# Patient Record
Sex: Female | Born: 1940
Health system: Southern US, Community
[De-identification: ages and names within clinical notes are randomized; demographics above are authoritative.]

## PROBLEM LIST (undated history)

## (undated) DIAGNOSIS — Z905 Acquired absence of kidney: Secondary | ICD-10-CM

## (undated) DIAGNOSIS — J439 Emphysema, unspecified: Secondary | ICD-10-CM

## (undated) DIAGNOSIS — L309 Dermatitis, unspecified: Secondary | ICD-10-CM

## (undated) DIAGNOSIS — E785 Hyperlipidemia, unspecified: Secondary | ICD-10-CM

## (undated) DIAGNOSIS — N182 Chronic kidney disease, stage 2 (mild): Secondary | ICD-10-CM

## (undated) DIAGNOSIS — C569 Malignant neoplasm of unspecified ovary: Secondary | ICD-10-CM

## (undated) DIAGNOSIS — G479 Sleep disorder, unspecified: Secondary | ICD-10-CM

## (undated) DIAGNOSIS — I10 Essential (primary) hypertension: Secondary | ICD-10-CM

## (undated) DIAGNOSIS — Z87448 Personal history of other diseases of urinary system: Secondary | ICD-10-CM

## (undated) DIAGNOSIS — I2510 Atherosclerotic heart disease of native coronary artery without angina pectoris: Secondary | ICD-10-CM

## (undated) DIAGNOSIS — K219 Gastro-esophageal reflux disease without esophagitis: Secondary | ICD-10-CM

## (undated) DIAGNOSIS — Z9289 Personal history of other medical treatment: Secondary | ICD-10-CM

## (undated) DIAGNOSIS — J449 Chronic obstructive pulmonary disease, unspecified: Secondary | ICD-10-CM

## (undated) HISTORY — DX: Acquired absence of kidney: Z90.5

## (undated) HISTORY — DX: Personal history of other diseases of urinary system: Z87.448

## (undated) HISTORY — PX: TONSILLECTOMY: SUR1361

## (undated) HISTORY — DX: Chronic kidney disease, stage 2 (mild): N18.2

## (undated) HISTORY — PX: TUBAL LIGATION: SHX77

## (undated) HISTORY — PX: NEPHRECTOMY: SHX65

## (undated) HISTORY — PX: ABDOMINAL HYSTERECTOMY: SHX81

## (undated) HISTORY — DX: Emphysema, unspecified: J43.9

## (undated) HISTORY — DX: Essential (primary) hypertension: I10

## (undated) HISTORY — PX: HAND SURGERY: SHX662

## (undated) HISTORY — DX: Malignant neoplasm of unspecified ovary: C56.9

## (undated) HISTORY — DX: Hyperlipidemia, unspecified: E78.5

## (undated) HISTORY — DX: Atherosclerotic heart disease of native coronary artery without angina pectoris: I25.10

## (undated) HISTORY — PX: THORACENTESIS: SHX235

## (undated) HISTORY — DX: Chronic obstructive pulmonary disease, unspecified: J44.9

---

## 2011-03-26 ENCOUNTER — Emergency Department (HOSPITAL_COMMUNITY): Payer: Medicare Other

## 2011-03-26 ENCOUNTER — Inpatient Hospital Stay (INDEPENDENT_AMBULATORY_CARE_PROVIDER_SITE_OTHER)
Admission: RE | Admit: 2011-03-26 | Discharge: 2011-03-26 | Disposition: A | Discharge status: Home / Self Care | Payer: Medicare Other | Source: Ambulatory Visit | Attending: Family Medicine | Admitting: Family Medicine

## 2011-03-26 ENCOUNTER — Emergency Department (HOSPITAL_COMMUNITY)
Admission: EM | Admit: 2011-03-26 | Discharge: 2011-03-26 | Disposition: A | Discharge status: Home / Self Care | Payer: Medicare Other | Attending: Emergency Medicine | Admitting: Emergency Medicine

## 2011-03-26 DIAGNOSIS — R0902 Hypoxemia: Secondary | ICD-10-CM

## 2011-03-26 DIAGNOSIS — I498 Other specified cardiac arrhythmias: Secondary | ICD-10-CM

## 2011-03-26 DIAGNOSIS — R059 Cough, unspecified: Secondary | ICD-10-CM | POA: Insufficient documentation

## 2011-03-26 DIAGNOSIS — Z79899 Other long term (current) drug therapy: Secondary | ICD-10-CM | POA: Insufficient documentation

## 2011-03-26 DIAGNOSIS — R05 Cough: Secondary | ICD-10-CM | POA: Insufficient documentation

## 2011-03-26 DIAGNOSIS — R509 Fever, unspecified: Secondary | ICD-10-CM | POA: Insufficient documentation

## 2011-03-26 DIAGNOSIS — I10 Essential (primary) hypertension: Secondary | ICD-10-CM | POA: Insufficient documentation

## 2011-03-26 DIAGNOSIS — E7800 Pure hypercholesterolemia, unspecified: Secondary | ICD-10-CM | POA: Insufficient documentation

## 2011-03-26 LAB — GLUCOSE, CAPILLARY: Glucose-Capillary: 119 mg/dL — ABNORMAL HIGH (ref 70–99)

## 2011-03-26 LAB — URINALYSIS, ROUTINE W REFLEX MICROSCOPIC
Glucose, UA: NEGATIVE mg/dL
Leukocytes, UA: NEGATIVE
Protein, ur: NEGATIVE mg/dL

## 2011-03-26 LAB — COMPREHENSIVE METABOLIC PANEL
ALT: 13 U/L (ref 0–35)
AST: 14 U/L (ref 0–37)
CO2: 27 mEq/L (ref 19–32)
Calcium: 10 mg/dL (ref 8.4–10.5)
GFR calc non Af Amer: >60 mL/min (ref >60)
Sodium: 141 mEq/L (ref 135–145)
Total Protein: 6.4 g/dL (ref 6.0–8.3)

## 2011-03-26 LAB — DIFFERENTIAL
Basophils Absolute: 0 10*3/uL (ref 0.0–0.1)
Basophils Relative: 0 % (ref 0–1)
Neutro Abs: 10.9 10*3/uL — ABNORMAL HIGH (ref 1.7–7.7)
Neutrophils Relative %: 95 % — ABNORMAL HIGH (ref 43–77)

## 2011-03-26 LAB — CBC
Hemoglobin: 14.1 g/dL (ref 12.0–15.0)
MCHC: 33.3 g/dL (ref 30.0–36.0)
RBC: 4.47 MIL/uL (ref 3.87–5.11)
WBC: 11.4 10*3/uL — ABNORMAL HIGH (ref 4.0–10.5)

## 2011-03-27 LAB — URINE CULTURE
Colony Count: NO GROWTH
Culture  Setup Time: 201209161750

## 2011-03-29 LAB — CULTURE, BLOOD (ROUTINE X 2): Culture  Setup Time: 201209161755

## 2011-07-12 DIAGNOSIS — J438 Other emphysema: Secondary | ICD-10-CM | POA: Diagnosis not present

## 2011-07-12 DIAGNOSIS — R911 Solitary pulmonary nodule: Secondary | ICD-10-CM | POA: Diagnosis not present

## 2011-07-14 DIAGNOSIS — Z1231 Encounter for screening mammogram for malignant neoplasm of breast: Secondary | ICD-10-CM | POA: Diagnosis not present

## 2011-07-17 DIAGNOSIS — J449 Chronic obstructive pulmonary disease, unspecified: Secondary | ICD-10-CM | POA: Diagnosis not present

## 2011-11-01 DIAGNOSIS — J449 Chronic obstructive pulmonary disease, unspecified: Secondary | ICD-10-CM | POA: Diagnosis not present

## 2011-11-01 DIAGNOSIS — E7800 Pure hypercholesterolemia, unspecified: Secondary | ICD-10-CM | POA: Diagnosis not present

## 2011-11-01 DIAGNOSIS — I10 Essential (primary) hypertension: Secondary | ICD-10-CM | POA: Diagnosis not present

## 2012-02-06 DIAGNOSIS — M899 Disorder of bone, unspecified: Secondary | ICD-10-CM | POA: Diagnosis not present

## 2012-02-06 DIAGNOSIS — M949 Disorder of cartilage, unspecified: Secondary | ICD-10-CM | POA: Diagnosis not present

## 2012-02-06 DIAGNOSIS — E785 Hyperlipidemia, unspecified: Secondary | ICD-10-CM | POA: Diagnosis not present

## 2012-02-06 DIAGNOSIS — J4489 Other specified chronic obstructive pulmonary disease: Secondary | ICD-10-CM | POA: Diagnosis not present

## 2012-02-06 DIAGNOSIS — J449 Chronic obstructive pulmonary disease, unspecified: Secondary | ICD-10-CM | POA: Diagnosis not present

## 2012-02-06 DIAGNOSIS — F17200 Nicotine dependence, unspecified, uncomplicated: Secondary | ICD-10-CM | POA: Diagnosis not present

## 2012-02-06 DIAGNOSIS — I10 Essential (primary) hypertension: Secondary | ICD-10-CM | POA: Diagnosis not present

## 2012-02-06 DIAGNOSIS — Z79899 Other long term (current) drug therapy: Secondary | ICD-10-CM | POA: Diagnosis not present

## 2012-02-13 DIAGNOSIS — M899 Disorder of bone, unspecified: Secondary | ICD-10-CM | POA: Diagnosis not present

## 2012-02-13 DIAGNOSIS — E785 Hyperlipidemia, unspecified: Secondary | ICD-10-CM | POA: Diagnosis not present

## 2012-02-13 DIAGNOSIS — Z79899 Other long term (current) drug therapy: Secondary | ICD-10-CM | POA: Diagnosis not present

## 2012-02-13 DIAGNOSIS — M949 Disorder of cartilage, unspecified: Secondary | ICD-10-CM | POA: Diagnosis not present

## 2012-03-06 ENCOUNTER — Encounter: Payer: Self-pay | Admitting: *Deleted

## 2012-03-06 ENCOUNTER — Encounter: Payer: Self-pay | Admitting: Pulmonary Disease

## 2012-03-07 ENCOUNTER — Encounter: Payer: Self-pay | Admitting: Pulmonary Disease

## 2012-03-07 ENCOUNTER — Ambulatory Visit (INDEPENDENT_AMBULATORY_CARE_PROVIDER_SITE_OTHER): Payer: Medicare Other | Admitting: Pulmonary Disease

## 2012-03-07 VITALS — BP 130/82 | HR 79 | Temp 98.1°F | Ht 61.0 in | Wt 148.4 lb

## 2012-03-07 DIAGNOSIS — J449 Chronic obstructive pulmonary disease, unspecified: Secondary | ICD-10-CM | POA: Insufficient documentation

## 2012-03-07 NOTE — Addendum Note (Signed)
Addended by: Orma Flaming D on: 03/07/2012 01:20 PM   Modules accepted: Orders

## 2012-03-07 NOTE — Patient Instructions (Addendum)
Continue on symbicort daily, and albuterol for rescue Will schedule for PFT's, and will call you with the results. Continue your activity You must stop smoking.  This is the key for you to maintain your excellent functional status. Make sure you get your flu shot this year. followup with me in 6mos.

## 2012-03-07 NOTE — Progress Notes (Signed)
  Subjective:    Patient ID: Lynn Ramirez, female    DOB: 1941-01-04, 71 y.o.   MRN: [id number]  HPI The pt is a 71y/o female who I have been asked to see for management of COPD.  She recently started seeing a pulmonologist in [place] the beginning of the year, and was told that she had COPD based on spirometry.  She was scheduled to have full PFTs, but moved to [place] prior to doing this.  The patient was started on symbicort with a rescue inhaler, and thinks it has helped her breathing was heavier exertional activities.  She is very functional, and stays extremely active.  She reports a 3-4 block dyspnea on exertion had a moderate pace on flat ground.  She does not get winded bringing groceries in from the car, or doing housework.  Her main issue is with moderate to heavy exertional activities.  She has minimal cough or mucus production, despite the fact that she continues to smoke.  She denies any issues with lower extremity edema.  She did have a CT chest in January of this year to followup possible nodules, and this showed only emphysematous changes with no evidence for pulmonary nodules at that time.   Review of Systems  Constitutional: Negative for fever and unexpected weight change.  HENT: Positive for congestion and sneezing. Negative for ear pain, nosebleeds, sore throat, rhinorrhea, trouble swallowing, dental problem, postnasal drip and sinus pressure.   Eyes: Negative for redness and itching.  Respiratory: Positive for cough, shortness of breath and wheezing. Negative for chest tightness.   Cardiovascular: Negative for palpitations and leg swelling.  Gastrointestinal: Negative for nausea and vomiting.  Genitourinary: Negative for dysuria.  Musculoskeletal: Positive for joint swelling.  Skin: Negative for rash.  Neurological: Negative for headaches.  Hematological: Bruises/bleeds easily.  Psychiatric/Behavioral: Negative for dysphoric mood. The patient is nervous/anxious.         Objective:   Physical Exam Constitutional:  Well developed, no acute distress  HENT:  Nares patent without discharge, deviated septum to left with narrowing.   Oropharynx without exudate, palate and uvula are normal  Eyes:  Perrla, eomi, no scleral icterus  Neck:  No JVD, no TMG  Cardiovascular:  Normal rate, regular rhythm, no rubs or gallops.  No murmurs        Intact distal pulses  Pulmonary :  Mildly decreased breath sounds, no stridor or respiratory distress   No rales, rhonchi, or wheezing  Abdominal:  Soft, nondistended, bowel sounds present.  No tenderness noted.   Musculoskeletal:  No lower extremity edema noted.  Lymph Nodes:  No cervical lymphadenopathy noted  Skin:  No cyanosis noted  Neurologic:  Alert, appropriate, moves all 4 extremities without obvious deficit.         Assessment & Plan:

## 2012-03-07 NOTE — Assessment & Plan Note (Signed)
The pt has a h/o copd by what sounds like spirometry in the past, and she was supposed to have full pfts before moving here from [place].  She has an excellent functional status, and has seen some improvement in her breathing with symbicort.  Unfortunately, she continues to smoke, and I have stressed the importance of total smoking cessation in order to maintain her current QOL.  Will call her with results of PFT's, and will see her back in 6mos if doing well.  She has already had a pneumovax recently, and I have reminded her to get her flu shot this fall.

## 2012-03-25 DIAGNOSIS — Z23 Encounter for immunization: Secondary | ICD-10-CM | POA: Diagnosis not present

## 2012-05-06 ENCOUNTER — Ambulatory Visit (INDEPENDENT_AMBULATORY_CARE_PROVIDER_SITE_OTHER): Payer: Medicare Other | Admitting: Pulmonary Disease

## 2012-05-06 ENCOUNTER — Telehealth: Payer: Self-pay | Admitting: Pulmonary Disease

## 2012-05-06 DIAGNOSIS — J449 Chronic obstructive pulmonary disease, unspecified: Secondary | ICD-10-CM | POA: Diagnosis not present

## 2012-05-06 LAB — PULMONARY FUNCTION TEST

## 2012-05-06 MED ORDER — ALBUTEROL SULFATE HFA 108 (90 BASE) MCG/ACT IN AERS: 2.0000 | INHALATION_SPRAY | Freq: Four times a day (QID) | RESPIRATORY_TRACT | Status: DC | PRN

## 2012-05-06 NOTE — Telephone Encounter (Signed)
Per Pt Instructions on 03-07-12:  Patient Instructions     Continue on symbicort daily, and albuterol for rescue  Will schedule for PFT's, and will call you with the results.  Continue your activity  You must stop smoking. This is the key for you to maintain your excellent functional status.  Make sure you get your flu shot this year.  followup with me in 6mos.     Rx has been sent to pharmacy; left message on patients voicemail that this has been taken care of and if any questions/concerns to please call our office.

## 2012-05-06 NOTE — Progress Notes (Signed)
PFT done today. 

## 2012-05-27 ENCOUNTER — Telehealth: Payer: Self-pay | Admitting: Pulmonary Disease

## 2012-05-27 NOTE — Telephone Encounter (Signed)
Please let pt know that her breathing studies show severe emphysema.  She must stop smoking.   Would like for her to continue with her current inhaler, but call us if symptoms are worsening.  Keep 6 mos f/u apptm with me.

## 2012-05-28 NOTE — Telephone Encounter (Signed)
Pt is aware of PFT results per Dr. Shelle Iron and says she is working on smoking cessation. She will see KC back for follow-up in Feb 2014 and will call if any worsening sxs before then. Pt verbalized understanding.

## 2012-07-30 ENCOUNTER — Ambulatory Visit (INDEPENDENT_AMBULATORY_CARE_PROVIDER_SITE_OTHER): Payer: Medicare Other | Admitting: Pulmonary Disease

## 2012-07-30 ENCOUNTER — Ambulatory Visit (INDEPENDENT_AMBULATORY_CARE_PROVIDER_SITE_OTHER)
Admission: RE | Admit: 2012-07-30 | Discharge: 2012-07-30 | Disposition: A | Discharge status: Home / Self Care | Payer: Medicare Other | Source: Ambulatory Visit | Attending: Pulmonary Disease | Admitting: Pulmonary Disease

## 2012-07-30 ENCOUNTER — Encounter: Payer: Self-pay | Admitting: Pulmonary Disease

## 2012-07-30 ENCOUNTER — Telehealth: Payer: Self-pay | Admitting: Pulmonary Disease

## 2012-07-30 VITALS — BP 128/62 | HR 96 | Temp 98.2°F | Ht 61.0 in | Wt 151.4 lb

## 2012-07-30 DIAGNOSIS — J45909 Unspecified asthma, uncomplicated: Secondary | ICD-10-CM | POA: Insufficient documentation

## 2012-07-30 DIAGNOSIS — J45901 Unspecified asthma with (acute) exacerbation: Secondary | ICD-10-CM | POA: Diagnosis not present

## 2012-07-30 DIAGNOSIS — J449 Chronic obstructive pulmonary disease, unspecified: Secondary | ICD-10-CM

## 2012-07-30 MED ORDER — MOXIFLOXACIN HCL 400 MG PO TABS: 400.0000 mg | ORAL_TABLET | Freq: Every day | ORAL | Status: DC

## 2012-07-30 MED ORDER — PREDNISONE 10 MG PO TABS: ORAL_TABLET | ORAL | Status: DC

## 2012-07-30 NOTE — Telephone Encounter (Signed)
Pt called back to add the following: she called pcp and they cannot see her today. I have scheduled her to see dr clance tomorrow but she asks that she be seen today (but wants tomorrows appt if otherwise. Lynn Ramirez

## 2012-07-30 NOTE — Progress Notes (Signed)
  Subjective:    Patient ID: Lynn Ramirez, female    DOB: 15-Apr-1941, 71 y.o.   MRN: [id number]  HPI The patient comes in today for an acute sick visit.  She was recently diagnosed with severe obstructive lung disease, felt secondary to emphysema.  Unfortunately she has continued to smoke.  She gives a 4 day history of feeling poorly, but no fever or chills.  She denies a runny nose, sore throat, myalgias, or arthralgias.  She then developed a tight cough, but now is having a rattling cough but no mucus production.  She is not having a significant increase in shortness of breath, but does say that she has to use her albuterol more than usual.  Of note, her oxygen level is much lower than baseline today on evaluation.   Review of Systems  Constitutional: Positive for fatigue. Negative for fever and unexpected weight change.  HENT: Positive for rhinorrhea, sneezing, postnasal drip and sinus pressure. Negative for ear pain, nosebleeds, congestion, sore throat, trouble swallowing and dental problem.   Eyes: Negative for redness and itching.  Respiratory: Positive for cough and shortness of breath. Negative for chest tightness and wheezing.   Cardiovascular: Negative for palpitations and leg swelling.  Gastrointestinal: Negative for nausea and vomiting.  Genitourinary: Negative for dysuria.  Musculoskeletal: Negative for joint swelling.  Skin: Negative for rash.  Neurological: Negative for headaches.  Hematological: Does not bruise/bleed easily.  Psychiatric/Behavioral: Negative for dysphoric mood. The patient is not nervous/anxious.        Objective:   Physical Exam Well-developed female in no acute distress.  She talks in complete sentences without dyspnea Nose without purulence or discharge noted Oropharynx clear without exudates Neck without lymphadenopathy or thyromegaly Chest reveals very diminished breath sounds throughout, no wheezing, mild rhonchi on expiration Cardiac exam with  regular rate and rhythm Lower extremities without edema, and no cyanosis.  No calf tenderness noted Alert and oriented, moves all 4 extremities.       Assessment & Plan:

## 2012-07-30 NOTE — Assessment & Plan Note (Signed)
The patient's history is most consistent with acute asthmatic bronchitis, although I cannot exclude a possible pneumonia.  There is nothing by history to suggest influenza.  I would like to start her on a course of antibiotics, a prednisone taper, and will also need oxygen temporarily until she improves.  I discussed with her the possibility of hospital admission, but she would like to try and avoid this if at all possible.  She is in no acute distress, and has adequate saturations with supplemental oxygen.  However, I have cautioned her that she is to call us immediately or go to the emergency room if she is worsening.  I also stressed to her the importance of staying completely off cigarettes.  I will see her back in one week.

## 2012-07-30 NOTE — Patient Instructions (Addendum)
Will send down for a chest xray, and will call you with results.  Will treat with avelox 400mg  one a day for 7 days Will treat with prednisone taper as directed. Will need to start you on oxygen temporarily until you are better.  followup with me in one week, but you need to call us if you are not improving.  If you worsen during the middle of the night, go to ER.

## 2012-07-30 NOTE — Telephone Encounter (Signed)
Called, spoke with pt.  C/o fatigue, cough, HA, and chest congestion since Saturday.  Coughing very little mucus up that is clear to yellow.  Taking mucinex with no relief and using albuterol hfa bid since symptoms started.  OV scheduled today at 10:30 am with [facility] and appt on tomorrow was cancelled.  Pt aware of both and voiced no further questions or concerns at this time.

## 2012-07-31 ENCOUNTER — Telehealth: Payer: Self-pay | Admitting: Pulmonary Disease

## 2012-07-31 ENCOUNTER — Ambulatory Visit: Payer: Medicare Other | Admitting: Pulmonary Disease

## 2012-07-31 NOTE — Telephone Encounter (Signed)
Notes Recorded by Barbaraann Share, MD on 07/30/2012 at 6:11 PM Please let pt know no pna on cxr . Good news. --  I spoke with patient about results and she verbalized understanding and had no questions

## 2012-08-02 ENCOUNTER — Telehealth: Payer: Self-pay | Admitting: Pulmonary Disease

## 2012-08-02 NOTE — Telephone Encounter (Signed)
Per referral sent she needs to be on 2 liters 24/7.   I spoke with pt and is aware. Nothing further was needed

## 2012-08-06 ENCOUNTER — Ambulatory Visit (INDEPENDENT_AMBULATORY_CARE_PROVIDER_SITE_OTHER): Payer: Medicare Other | Admitting: Pulmonary Disease

## 2012-08-06 ENCOUNTER — Encounter: Payer: Self-pay | Admitting: Pulmonary Disease

## 2012-08-06 VITALS — BP 152/78 | HR 76 | Temp 97.8°F | Ht 61.0 in | Wt 155.0 lb

## 2012-08-06 DIAGNOSIS — J449 Chronic obstructive pulmonary disease, unspecified: Secondary | ICD-10-CM

## 2012-08-06 NOTE — Patient Instructions (Addendum)
Stay on symbicort every day, and rinse mouth well. Stay completely away from cigarettes. Will discontinue oxygen followup with me in 4mos to make sure you are doing ok, and cancel any extra apptms.

## 2012-08-06 NOTE — Progress Notes (Signed)
  Subjective:    Patient ID: Lynn Ramirez, female    DOB: [birth year], 72 y.o.   MRN: [id number]  HPI Patient comes in today for followup after her recent episode of acute asthmatic bronchitis with significant respiratory distress.  She was treated with antibiotics and prednisone, and returns today for followup.  She is much improved since the last visit, he feels that she is almost back to her baseline.  She still has a mild cough but no significant mucous production.  She has not smoked since her episode last week.   Review of Systems  Constitutional: Negative for fever and unexpected weight change.  HENT: Positive for voice change ( hoarseness with O2---dryness). Negative for ear pain, nosebleeds, congestion, sore throat, rhinorrhea, sneezing, trouble swallowing, dental problem, postnasal drip and sinus pressure.   Eyes: Negative for redness and itching.  Respiratory: Positive for cough ( deep cough at times---not as frequent as before) and wheezing ( at times). Negative for chest tightness and shortness of breath.   Cardiovascular: Negative for palpitations and leg swelling.  Gastrointestinal: Positive for diarrhea ( from Avelox). Negative for nausea and vomiting.       Bloating/gas from Pred  Genitourinary: Negative for dysuria.  Musculoskeletal: Negative for joint swelling.  Skin: Negative for rash.  Neurological: Negative for headaches.  Hematological: Does not bruise/bleed easily.  Psychiatric/Behavioral: Negative for dysphoric mood. The patient is not nervous/anxious.        Objective:   Physical Exam Overweight female in no acute distress Nose without purulence or discharge Neck without lymphadenopathy or thyromegaly Chest with mild decrease in breath sounds, no wheezing Cardiac exam with regular rate and rhythm Lower extremities without significant edema, no cyanosis Alert and oriented, moves all 4.        Assessment & Plan:

## 2012-08-06 NOTE — Assessment & Plan Note (Signed)
The patient appears significantly recovered from her episode of acute asthmatic bronchitis.  She is almost back to baseline, and has minimal residual cough.  I have told her the most important thing is to stay completely away from cigarettes.  I will not intensify her bronchodilator regimen currently, but have told her that if she continues to smoke and had acute exacerbations we may have to add additional medications.

## 2012-08-28 ENCOUNTER — Ambulatory Visit: Payer: Medicare Other | Admitting: Pulmonary Disease

## 2012-10-29 ENCOUNTER — Other Ambulatory Visit (HOSPITAL_COMMUNITY)
Admission: RE | Admit: 2012-10-29 | Discharge: 2012-10-29 | Disposition: A | Discharge status: Home / Self Care | Payer: Medicare Other | Source: Ambulatory Visit | Attending: Family Medicine | Admitting: Family Medicine

## 2012-10-29 ENCOUNTER — Other Ambulatory Visit: Payer: Self-pay | Admitting: Family Medicine

## 2012-10-29 DIAGNOSIS — Z1211 Encounter for screening for malignant neoplasm of colon: Secondary | ICD-10-CM | POA: Diagnosis not present

## 2012-10-29 DIAGNOSIS — Z124 Encounter for screening for malignant neoplasm of cervix: Secondary | ICD-10-CM | POA: Insufficient documentation

## 2012-10-29 DIAGNOSIS — Z0000 Encounter for general adult medical examination without abnormal findings: Secondary | ICD-10-CM | POA: Diagnosis not present

## 2012-10-29 DIAGNOSIS — R7309 Other abnormal glucose: Secondary | ICD-10-CM | POA: Diagnosis not present

## 2012-10-29 DIAGNOSIS — Z23 Encounter for immunization: Secondary | ICD-10-CM | POA: Diagnosis not present

## 2012-10-29 DIAGNOSIS — E785 Hyperlipidemia, unspecified: Secondary | ICD-10-CM | POA: Diagnosis not present

## 2012-11-11 DIAGNOSIS — L821 Other seborrheic keratosis: Secondary | ICD-10-CM | POA: Diagnosis not present

## 2012-11-11 DIAGNOSIS — D235 Other benign neoplasm of skin of trunk: Secondary | ICD-10-CM | POA: Diagnosis not present

## 2012-11-26 DIAGNOSIS — Z1231 Encounter for screening mammogram for malignant neoplasm of breast: Secondary | ICD-10-CM | POA: Diagnosis not present

## 2012-12-04 ENCOUNTER — Encounter: Payer: Self-pay | Admitting: Pulmonary Disease

## 2012-12-04 ENCOUNTER — Ambulatory Visit (INDEPENDENT_AMBULATORY_CARE_PROVIDER_SITE_OTHER): Payer: Medicare Other | Admitting: Pulmonary Disease

## 2012-12-04 ENCOUNTER — Telehealth: Payer: Self-pay | Admitting: Pulmonary Disease

## 2012-12-04 VITALS — BP 130/80 | HR 80 | Temp 97.1°F | Ht 60.25 in | Wt 165.8 lb

## 2012-12-04 DIAGNOSIS — F17200 Nicotine dependence, unspecified, uncomplicated: Secondary | ICD-10-CM

## 2012-12-04 DIAGNOSIS — J449 Chronic obstructive pulmonary disease, unspecified: Secondary | ICD-10-CM | POA: Diagnosis not present

## 2012-12-04 MED ORDER — BUDESONIDE-FORMOTEROL FUMARATE 160-4.5 MCG/ACT IN AERO: 2.0000 | INHALATION_SPRAY | Freq: Two times a day (BID) | RESPIRATORY_TRACT | Status: DC

## 2012-12-04 MED ORDER — ALBUTEROL SULFATE HFA 108 (90 BASE) MCG/ACT IN AERS: 2.0000 | INHALATION_SPRAY | Freq: Four times a day (QID) | RESPIRATORY_TRACT | Status: DC | PRN

## 2012-12-04 NOTE — Assessment & Plan Note (Addendum)
The patient is doing much better since she has almost totally quit smoking.  She only smokes a cigarette on rare occasions.  She has seen significant improvement in her chest congestion and cough, and rarely produces mucus.  She feels that her breathing is much improved on her current regimen.  I have asked her to totally stop smoking, and would consider adding a LAMA if her breathing issues continue.  She is going on a trip to [place] this summer, and is already borderline on her oxygen saturations.  I have offered to prescribe oxygen while she is on her trip, but the patient would like to hold off on this.

## 2012-12-04 NOTE — Telephone Encounter (Signed)
Rx for Symbicort and Albuterol HFA have been sent to pt pharm in [place].

## 2012-12-04 NOTE — Progress Notes (Signed)
  Subjective:    Patient ID: Lynn Ramirez, female    DOB: 1940-10-02, 72 y.o.   MRN: [id number]  HPI Patient comes in today for followup of her known COPD.  She has almost totally quit smoking, and has seen significant improvement in her breathing.  She continues on her bronchodilator regimen, and rarely requires her rescue inhaler use.  She denies any significant congestion or mucus at this time.  She is having a lot of allergies and postnasal drip.   Review of Systems  Constitutional: Negative for fever and unexpected weight change.  HENT: Negative for ear pain, nosebleeds, congestion, sore throat, rhinorrhea, sneezing, trouble swallowing, dental problem, postnasal drip and sinus pressure.   Eyes: Negative for redness and itching.  Respiratory: Negative for cough, chest tightness, shortness of breath and wheezing.   Cardiovascular: Negative for palpitations and leg swelling.  Gastrointestinal: Negative for nausea and vomiting.  Genitourinary: Negative for dysuria.  Musculoskeletal: Negative for joint swelling.  Skin: Negative for rash.  Neurological: Negative for headaches.  Hematological: Does not bruise/bleed easily.  Psychiatric/Behavioral: Negative for dysphoric mood. The patient is not nervous/anxious.        Objective:   Physical Exam Overweight female in no acute distress Nose with purulent discharge noted Neck without lymphadenopathy or thyromegaly Chest with decreased breath sounds, no wheezes or rhonchi Cardiac exam with regular rate and rhythm Lower extremities with no significant edema, no cyanosis Alert and oriented, moves all 4 extremities.       Assessment & Plan:

## 2012-12-04 NOTE — Patient Instructions (Addendum)
Stay on current medications. Keep as active as possible Try and stay completely off cigarettes. followup with me again in 4mos.  Please call if you have breathing issues in [place].

## 2013-03-04 DIAGNOSIS — E785 Hyperlipidemia, unspecified: Secondary | ICD-10-CM | POA: Diagnosis not present

## 2013-03-04 DIAGNOSIS — Z905 Acquired absence of kidney: Secondary | ICD-10-CM | POA: Diagnosis not present

## 2013-03-04 DIAGNOSIS — I10 Essential (primary) hypertension: Secondary | ICD-10-CM | POA: Diagnosis not present

## 2013-03-04 DIAGNOSIS — Z23 Encounter for immunization: Secondary | ICD-10-CM | POA: Diagnosis not present

## 2013-04-08 ENCOUNTER — Ambulatory Visit: Payer: Medicare Other | Admitting: Pulmonary Disease

## 2013-04-09 ENCOUNTER — Encounter: Payer: Self-pay | Admitting: Pulmonary Disease

## 2013-04-09 ENCOUNTER — Ambulatory Visit (INDEPENDENT_AMBULATORY_CARE_PROVIDER_SITE_OTHER): Payer: Medicare Other | Admitting: Pulmonary Disease

## 2013-04-09 VITALS — BP 124/80 | HR 77 | Temp 97.3°F | Ht 60.0 in | Wt 167.0 lb

## 2013-04-09 DIAGNOSIS — J449 Chronic obstructive pulmonary disease, unspecified: Secondary | ICD-10-CM

## 2013-04-09 NOTE — Assessment & Plan Note (Signed)
The patient appears to be doing very well from a pulmonary standpoint.  She has not had an acute exacerbation, and feels that her exertional tolerance is stable.  I've asked her to stay on her bronchodilator regimen, and also to work aggressively on weight loss and conditioning.  I have given her samples of dymista to try for her nasal congestion.

## 2013-04-09 NOTE — Progress Notes (Signed)
  Subjective:    Patient ID: Lynn Ramirez, female    DOB: 02-Nov-1940, 72 y.o.   MRN: [id number]  HPI The patient comes in today for followup of her known COPD.  She recently went on a trip to [place], and actually did fairly well.  She is staying on her bronchodilator regimen, and has not had any worsening symptoms or acute exacerbation.  She denies any significant cough or mucus production.  She has been having a lot of nasal congestion after a cold.  She also has postnasal drip with this.  She is trying to exercise about one to 2 days a week, but I've asked her to increase this to 3-4 days a week.   Review of Systems  Constitutional: Negative for fever and unexpected weight change.  HENT: Positive for congestion and postnasal drip. Negative for ear pain, nosebleeds, sore throat, rhinorrhea, sneezing, trouble swallowing, dental problem and sinus pressure.   Eyes: Negative for redness and itching.  Respiratory: Positive for cough and shortness of breath. Negative for chest tightness and wheezing.   Cardiovascular: Negative for palpitations and leg swelling.  Gastrointestinal: Negative for nausea and vomiting.  Genitourinary: Negative for dysuria.  Musculoskeletal: Negative for joint swelling.  Skin: Negative for rash.  Neurological: Negative for headaches.  Hematological: Does not bruise/bleed easily.  Psychiatric/Behavioral: Negative for dysphoric mood. The patient is not nervous/anxious.        Objective:   Physical Exam Overweight female in no acute distress Nose with purulent discharge noted Neck without lymphadenopathy or thyromegaly Chest with mildly decreased breath sounds, no wheezing Cardiac exam with regular rate and rhythm Lower extremities without edema, no cyanosis Alert and oriented, moves all 4 extremities.       Assessment & Plan:

## 2013-04-09 NOTE — Patient Instructions (Addendum)
No change in medications Work harder on weight loss and conditioning (at least 4 days a week).  If doing well, followup with me in 6mos.

## 2013-05-26 ENCOUNTER — Telehealth: Payer: Self-pay | Admitting: Pulmonary Disease

## 2013-05-26 NOTE — Telephone Encounter (Signed)
Ok to give her a box of symbicort.

## 2013-05-26 NOTE — Telephone Encounter (Signed)
I spoke with pt. She reports she is getting ready to be in the donut hole. She reports she just need 1 to stay out of the donut hole. Please advise KC thanks  --note pt has never called for samples looking in her chart

## 2013-05-26 NOTE — Telephone Encounter (Signed)
Sample is up front for pick up. Pt is aware. 

## 2013-05-27 ENCOUNTER — Encounter: Payer: Self-pay | Admitting: Pulmonary Disease

## 2013-08-14 ENCOUNTER — Other Ambulatory Visit: Payer: Self-pay | Admitting: Pulmonary Disease

## 2013-09-17 DIAGNOSIS — Z1239 Encounter for other screening for malignant neoplasm of breast: Secondary | ICD-10-CM | POA: Diagnosis not present

## 2013-09-17 DIAGNOSIS — M949 Disorder of cartilage, unspecified: Secondary | ICD-10-CM | POA: Diagnosis not present

## 2013-09-17 DIAGNOSIS — I10 Essential (primary) hypertension: Secondary | ICD-10-CM | POA: Diagnosis not present

## 2013-09-17 DIAGNOSIS — M899 Disorder of bone, unspecified: Secondary | ICD-10-CM | POA: Diagnosis not present

## 2013-09-17 DIAGNOSIS — E785 Hyperlipidemia, unspecified: Secondary | ICD-10-CM | POA: Diagnosis not present

## 2013-10-08 ENCOUNTER — Encounter: Payer: Self-pay | Admitting: Pulmonary Disease

## 2013-10-08 ENCOUNTER — Ambulatory Visit (INDEPENDENT_AMBULATORY_CARE_PROVIDER_SITE_OTHER): Payer: Medicare Other | Admitting: Pulmonary Disease

## 2013-10-08 ENCOUNTER — Encounter (INDEPENDENT_AMBULATORY_CARE_PROVIDER_SITE_OTHER): Payer: Self-pay

## 2013-10-08 VITALS — BP 102/78 | HR 76 | Temp 98.1°F | Ht 60.0 in | Wt 170.0 lb

## 2013-10-08 DIAGNOSIS — J449 Chronic obstructive pulmonary disease, unspecified: Secondary | ICD-10-CM | POA: Diagnosis not present

## 2013-10-08 NOTE — Patient Instructions (Signed)
No change in medications Work on hard on weight loss and conditioning.  followup with me again in 73mos.

## 2013-10-08 NOTE — Progress Notes (Signed)
   Subjective:    Patient ID: Lynn Ramirez, female    DOB: 05-16-1941, 73 y.o.   MRN: [id number]  HPI The patient comes in today for followup of her known COPD. She's been staying extremely active, and staying on her medications. She has not had an acute exacerbation or pulmonary infection since last visit. She has noticed a decline in her exertional tolerance, but has also gained weight and quit going to her exercise program.   Review of Systems  Constitutional: Negative for fever and unexpected weight change.  HENT: Negative for congestion, dental problem, ear pain, nosebleeds, postnasal drip, rhinorrhea, sinus pressure, sneezing, sore throat and trouble swallowing.   Eyes: Negative for redness and itching.  Respiratory: Positive for shortness of breath. Negative for cough, chest tightness and wheezing.   Cardiovascular: Negative for palpitations and leg swelling.  Gastrointestinal: Negative for nausea and vomiting.  Genitourinary: Negative for dysuria.  Musculoskeletal: Negative for joint swelling.  Skin: Negative for rash.  Neurological: Negative for headaches.  Hematological: Does not bruise/bleed easily.  Psychiatric/Behavioral: Negative for dysphoric mood. The patient is not nervous/anxious.        Objective:   Physical Exam Overweight female in no acute distress Nose without purulence or discharge noted Neck without lymphadenopathy or thyromegaly Chest with mild decrease in breath sounds, no wheezing Cardiac exam with regular rate and rhythm Lower extremities with no significant edema, no cyanosis Alert and oriented, moves all 4 extremities.       Assessment & Plan:

## 2013-10-08 NOTE — Assessment & Plan Note (Signed)
The patient has continued to maintain a high level of functioning, but has seen a decline in her exertional tolerance since gaining weight and not participating in a conditioning program. She has not had any chest congestion or cough. I have asked her to continue on her bronchodilator regimen, and to work harder on weight loss and conditioning.

## 2013-10-27 DIAGNOSIS — E785 Hyperlipidemia, unspecified: Secondary | ICD-10-CM | POA: Diagnosis not present

## 2013-10-27 DIAGNOSIS — Z79899 Other long term (current) drug therapy: Secondary | ICD-10-CM | POA: Diagnosis not present

## 2013-10-27 DIAGNOSIS — R7309 Other abnormal glucose: Secondary | ICD-10-CM | POA: Diagnosis not present

## 2013-11-18 DIAGNOSIS — I10 Essential (primary) hypertension: Secondary | ICD-10-CM | POA: Diagnosis not present

## 2013-11-18 DIAGNOSIS — Z79899 Other long term (current) drug therapy: Secondary | ICD-10-CM | POA: Diagnosis not present

## 2013-11-27 DIAGNOSIS — Z803 Family history of malignant neoplasm of breast: Secondary | ICD-10-CM | POA: Diagnosis not present

## 2013-11-27 DIAGNOSIS — Z1231 Encounter for screening mammogram for malignant neoplasm of breast: Secondary | ICD-10-CM | POA: Diagnosis not present

## 2013-11-27 DIAGNOSIS — Z780 Asymptomatic menopausal state: Secondary | ICD-10-CM | POA: Diagnosis not present

## 2014-01-16 DIAGNOSIS — Z905 Acquired absence of kidney: Secondary | ICD-10-CM | POA: Diagnosis not present

## 2014-01-16 DIAGNOSIS — Z79899 Other long term (current) drug therapy: Secondary | ICD-10-CM | POA: Diagnosis not present

## 2014-01-26 ENCOUNTER — Other Ambulatory Visit: Payer: Self-pay | Admitting: Pulmonary Disease

## 2014-01-27 ENCOUNTER — Other Ambulatory Visit: Payer: Self-pay | Admitting: Pulmonary Disease

## 2014-03-02 ENCOUNTER — Other Ambulatory Visit: Payer: Self-pay | Admitting: Pulmonary Disease

## 2014-03-20 DIAGNOSIS — Z905 Acquired absence of kidney: Secondary | ICD-10-CM | POA: Diagnosis not present

## 2014-03-20 DIAGNOSIS — Z79899 Other long term (current) drug therapy: Secondary | ICD-10-CM | POA: Diagnosis not present

## 2014-03-20 DIAGNOSIS — M899 Disorder of bone, unspecified: Secondary | ICD-10-CM | POA: Diagnosis not present

## 2014-03-20 DIAGNOSIS — Z23 Encounter for immunization: Secondary | ICD-10-CM | POA: Diagnosis not present

## 2014-03-20 DIAGNOSIS — R7309 Other abnormal glucose: Secondary | ICD-10-CM | POA: Diagnosis not present

## 2014-03-20 DIAGNOSIS — J4489 Other specified chronic obstructive pulmonary disease: Secondary | ICD-10-CM | POA: Diagnosis not present

## 2014-03-20 DIAGNOSIS — I10 Essential (primary) hypertension: Secondary | ICD-10-CM | POA: Diagnosis not present

## 2014-03-20 DIAGNOSIS — R82998 Other abnormal findings in urine: Secondary | ICD-10-CM | POA: Diagnosis not present

## 2014-03-20 DIAGNOSIS — E785 Hyperlipidemia, unspecified: Secondary | ICD-10-CM | POA: Diagnosis not present

## 2014-03-20 DIAGNOSIS — R319 Hematuria, unspecified: Secondary | ICD-10-CM | POA: Diagnosis not present

## 2014-03-20 DIAGNOSIS — J449 Chronic obstructive pulmonary disease, unspecified: Secondary | ICD-10-CM | POA: Diagnosis not present

## 2014-03-21 ENCOUNTER — Other Ambulatory Visit: Payer: Self-pay | Admitting: Pulmonary Disease

## 2014-03-25 ENCOUNTER — Encounter: Payer: Self-pay | Admitting: Gastroenterology

## 2014-04-09 ENCOUNTER — Encounter: Payer: Self-pay | Admitting: Pulmonary Disease

## 2014-04-09 ENCOUNTER — Encounter (INDEPENDENT_AMBULATORY_CARE_PROVIDER_SITE_OTHER): Payer: Self-pay

## 2014-04-09 ENCOUNTER — Ambulatory Visit (INDEPENDENT_AMBULATORY_CARE_PROVIDER_SITE_OTHER): Payer: Medicare Other | Admitting: Pulmonary Disease

## 2014-04-09 VITALS — BP 110/70 | HR 88 | Temp 96.0°F | Ht 61.0 in | Wt 171.8 lb

## 2014-04-09 DIAGNOSIS — J439 Emphysema, unspecified: Secondary | ICD-10-CM | POA: Diagnosis not present

## 2014-04-09 NOTE — Assessment & Plan Note (Signed)
The patient overall is doing fairly well, but is not at her usual baseline of the last few years. Part of this is related to weight, conditioning, and occasional smoking, and I have counseled her on this. She has never been tried on a LAMA in the past, and therefore I would like to give her a trial of Spiriva. She is to let me know how she responds to this.

## 2014-04-09 NOTE — Patient Instructions (Addendum)
Continue on your symbicort am and pm everyday.  Keep mouth rinsed out. Will start on spiriva respimat 2 inhalations each am everyday for next 4 weeks.  If you think it has helped, can send in a prescription for you. Stay on some type of conditioning program regularly Stay completely away from cigarettes. followup with me again in 55mos.

## 2014-04-09 NOTE — Progress Notes (Signed)
   Subjective:    Patient ID: Lynn Ramirez, female    DOB: August 20, 1940, 73 y.o.   MRN: [id number]  HPI Patient comes in today for followup of her known severe COPD. She has done fairly well since the last visit, but unfortunately is smoking an occasional cigarette. She had a difficult time during the summer because of the high heat and humidity, and feels that her exertional tolerance is not quite at her usual baseline. She has gained some weight, and is not sticking with a regular conditioning program, although she stays very active. She has already had her flu shot this year.   Review of Systems  Constitutional: Negative for fever and unexpected weight change.  HENT: Positive for congestion and postnasal drip. Negative for dental problem, ear pain, nosebleeds, rhinorrhea, sinus pressure, sneezing, sore throat and trouble swallowing.   Eyes: Negative for redness and itching.  Respiratory: Positive for cough. Negative for chest tightness, shortness of breath and wheezing.   Cardiovascular: Negative for palpitations and leg swelling.  Gastrointestinal: Negative for nausea and vomiting.  Genitourinary: Negative for dysuria.  Musculoskeletal: Negative for joint swelling.  Skin: Negative for rash.  Neurological: Negative for headaches.  Hematological: Does not bruise/bleed easily.  Psychiatric/Behavioral: Negative for dysphoric mood. The patient is not nervous/anxious.        Objective:   Physical Exam Overweight female in no acute distress Nose without purulence or discharge noted Neck without lymphadenopathy or thyromegaly Chest with mildly decreased breath sounds, no active wheezing Cardiac exam with regular rate and rhythm Lower extremities without significant edema, no cyanosis Alert and oriented, moves all 4 extremities.       Assessment & Plan:

## 2014-04-13 DIAGNOSIS — M7071 Other bursitis of hip, right hip: Secondary | ICD-10-CM | POA: Diagnosis not present

## 2014-04-16 DIAGNOSIS — Z905 Acquired absence of kidney: Secondary | ICD-10-CM | POA: Diagnosis not present

## 2014-04-16 DIAGNOSIS — Z79899 Other long term (current) drug therapy: Secondary | ICD-10-CM | POA: Diagnosis not present

## 2014-05-01 ENCOUNTER — Telehealth: Payer: Self-pay | Admitting: Pulmonary Disease

## 2014-05-01 MED ORDER — TIOTROPIUM BROMIDE MONOHYDRATE 2.5 MCG/ACT IN AERS: 2.0000 | INHALATION_SPRAY | Freq: Every day | RESPIRATORY_TRACT | Status: DC

## 2014-05-01 NOTE — Telephone Encounter (Signed)
LMTCB

## 2014-05-01 NOTE — Telephone Encounter (Signed)
Pt returned call. Pt states the spiriva respimat has helped her breathing tremendously and would like the med called into pharmacy. Rx sent in to preferred pharmacy. Pt verbalized understanding and denied any further questions or concerns at this time.

## 2014-05-01 NOTE — Telephone Encounter (Signed)
Pt returned call & can be reached at [phone].  Lynn Ramirez

## 2014-05-01 NOTE — Telephone Encounter (Signed)
lmtcb x1 

## 2014-05-01 NOTE — Telephone Encounter (Signed)
Pt calling back but hung up before I could get her number

## 2014-05-12 ENCOUNTER — Other Ambulatory Visit: Payer: Self-pay | Admitting: Pulmonary Disease

## 2014-05-21 ENCOUNTER — Ambulatory Visit: Payer: Medicare Other | Admitting: Gastroenterology

## 2014-05-21 ENCOUNTER — Telehealth: Payer: Self-pay | Admitting: Gastroenterology

## 2014-05-21 NOTE — Telephone Encounter (Signed)
No charge. 

## 2014-05-22 ENCOUNTER — Encounter (HOSPITAL_COMMUNITY): Payer: Self-pay | Admitting: *Deleted

## 2014-05-22 ENCOUNTER — Emergency Department (INDEPENDENT_AMBULATORY_CARE_PROVIDER_SITE_OTHER)
Admission: EM | Admit: 2014-05-22 | Discharge: 2014-05-22 | Disposition: A | Discharge status: Home / Self Care | Payer: Medicare Other | Source: Home / Self Care | Attending: Family Medicine | Admitting: Family Medicine

## 2014-05-22 ENCOUNTER — Emergency Department (INDEPENDENT_AMBULATORY_CARE_PROVIDER_SITE_OTHER): Payer: Medicare Other

## 2014-05-22 DIAGNOSIS — S86912A Strain of unspecified muscle(s) and tendon(s) at lower leg level, left leg, initial encounter: Secondary | ICD-10-CM | POA: Diagnosis not present

## 2014-05-22 DIAGNOSIS — M25462 Effusion, left knee: Secondary | ICD-10-CM | POA: Diagnosis not present

## 2014-05-22 DIAGNOSIS — S8992XA Unspecified injury of left lower leg, initial encounter: Secondary | ICD-10-CM | POA: Diagnosis not present

## 2014-05-22 MED ORDER — HYDROCODONE-ACETAMINOPHEN 5-325 MG PO TABS: 1.0000 | ORAL_TABLET | Freq: Four times a day (QID) | ORAL | Status: DC | PRN

## 2014-05-22 NOTE — ED Notes (Signed)
Pt  Reports   She  Twisted  Her  l  Knee      sev  Days  Ago       Going  Down  Some  Steps   She  Has  Pain on  Weight        Bearing

## 2014-05-22 NOTE — Discharge Instructions (Signed)
Wear brace and use ice and pain medicine as needed, see your orthopedist as planned for recheck.

## 2014-05-22 NOTE — ED Provider Notes (Addendum)
CSN: [id number]     Arrival date & time 05/22/14  1025 History   First MD Initiated Contact with Patient 05/22/14 1000     Chief Complaint  Patient presents with  . Knee Injury   (Consider location/radiation/quality/duration/timing/severity/associated sxs/prior Treatment) Patient is a 73 y.o. female presenting with knee pain. The history is provided by the patient.  Knee Pain Location:  Knee Time since incident:  3 days Injury: yes   Mechanism of injury comment:  Twisted coming off steps. Knee location:  L knee Pain details:    Quality:  Sharp   Severity:  Mild Chronicity:  New Dislocation: no   Prior injury to area:  No Associated symptoms: decreased ROM     Past Medical History  Diagnosis Date  . H/O hydronephrosis   . Hypertension   . Emphysema   . COPD (chronic obstructive pulmonary disease)    Past Surgical History  Procedure Laterality Date  . Nephrectomy    . Tonsilectomy, adenoidectomy, bilateral myringotomy and tubes    . Tubal ligation     Family History  Problem Relation Age of Onset  . Liver cancer Father   . Diabetes Father   . Liver cancer Mother   . Diabetes Paternal Grandfather    History  Substance Use Topics  . Smoking status: Former Smoker -- 1.00 packs/day for 40 years    Types: Cigarettes    Quit date: 07/10/2013  . Smokeless tobacco: Never Used     Comment: Pt states that she is has struggled with smoking since Jan--1 cig here and there 04/09/14  . Alcohol Use: Yes     Comment: social    OB History    No data available     Review of Systems  Constitutional: Negative.   Musculoskeletal: Positive for gait problem. Negative for joint swelling.       Using walker from neighbor.    Allergies  Penicillins  Home Medications   Prior to Admission medications   Medication Sig Start Date End Date Taking? Authorizing Provider  atorvastatin (LIPITOR) 10 MG tablet Take 10 mg by mouth daily.    Historical Provider, MD  calcium carbonate  (OS-CAL) 600 MG TABS Take 600 mg by mouth daily with breakfast.     Historical Provider, MD  cholecalciferol (VITAMIN D) 1000 UNITS tablet Take 1,000 Units by mouth daily.    Historical Provider, MD  fish oil-omega-3 fatty acids 1000 MG capsule Take 1 g by mouth daily.     Historical Provider, MD  HYDROcodone-acetaminophen (NORCO/VICODIN) 5-325 MG per tablet Take 1 tablet by mouth every 6 (six) hours as needed for moderate pain or severe pain. 05/22/14   Billy Fischer, MD  Multiple Vitamins-Minerals (MULTIVITAMIN WITH MINERALS) tablet Take 1 tablet by mouth daily.    Historical Provider, MD  PROAIR HFA 108 (90 BASE) MCG/ACT inhaler INHALE 2 PUFFS BY MOUTH FOUR TIMES DAILY AS NEEDED FOR WHEEZING 03/24/14   Kathee Delton, MD  SYMBICORT 160-4.5 MCG/ACT inhaler INHALE 2 PUFFS BY MOUTH TWICE DAILY 05/13/14   Kathee Delton, MD  Tiotropium Bromide Monohydrate (SPIRIVA RESPIMAT) 2.5 MCG/ACT AERS Inhale 2 puffs into the lungs daily. 05/01/14   Kathee Delton, MD  triamterene-hydrochlorothiazide (MAXZIDE-25) 37.5-25 MG per tablet Take 1 tablet by mouth daily.     Historical Provider, MD   BP 139/66 mmHg  Pulse 85  Temp(Src) 97.9 F (36.6 C) (Oral)  Resp 20  SpO2 96% Physical Exam  Constitutional: She is oriented to  person, place, and time. She appears well-developed and well-nourished.  Musculoskeletal: She exhibits tenderness.       Left knee: She exhibits normal range of motion, no swelling, no effusion, no deformity, no erythema, no LCL laxity, normal patellar mobility, normal meniscus and no MCL laxity. Tenderness found. Medial joint line and MCL tenderness noted. No patellar tendon tenderness noted.  Neurological: She is alert and oriented to person, place, and time.  Skin: Skin is warm and dry.  Nursing note and vitals reviewed.   ED Course  Procedures (including critical care time) Labs Review Labs Reviewed - No data to display  Imaging Review Dg Knee Complete 4 Views Left  05/22/2014    CLINICAL DATA:  Twisting injury 2 days ago.  Knee pain.  EXAM: LEFT KNEE - COMPLETE 4+ VIEW  COMPARISON:  None.  FINDINGS: Mild to moderate tricompartmental degenerative changes, most notable in the medial compartment. There is joint space narrowing and osteophytic spurring. No acute fracture or osteochondral abnormality. Small joint effusion.  IMPRESSION: Moderate degenerative changes.  No acute bony findings.  Small joint effusion.   Electronically Signed   By: Kalman Jewels M.D.   On: 05/22/2014 10:47     MDM   1. Strain of left knee, initial encounter        Billy Fischer, MD 05/22/14 [place], MD 05/22/14 [phone]

## 2014-06-18 ENCOUNTER — Encounter: Payer: Self-pay | Admitting: Gastroenterology

## 2014-06-18 ENCOUNTER — Ambulatory Visit (INDEPENDENT_AMBULATORY_CARE_PROVIDER_SITE_OTHER): Payer: Medicare Other | Admitting: Gastroenterology

## 2014-06-18 VITALS — BP 130/70 | HR 86 | Ht 61.0 in | Wt 171.4 lb

## 2014-06-18 DIAGNOSIS — R159 Full incontinence of feces: Secondary | ICD-10-CM | POA: Insufficient documentation

## 2014-06-18 MED ORDER — NA SULFATE-K SULFATE-MG SULF 17.5-3.13-1.6 GM/177ML PO SOLN: 1.0000 | Freq: Once | ORAL | Status: DC

## 2014-06-18 NOTE — Assessment & Plan Note (Signed)
Encopresis could be due to local disease such as hemorrhoidal disease.  Patient is aware of passing stool and draining a neurosensory etiology less likely.  Primary mucosal disease should be ruled out.  Recommendations #1 colonoscopy #2 to consider band ligation of hemorrhoids if it is felt that this is the etiology

## 2014-06-18 NOTE — Progress Notes (Signed)
_                                                                                                                History of Present Illness:  Lynn Ramirez is a 73 year old white female with history of COPD referred for evaluation of fecal incontinence.  2 months ago had several episodes of severe constipation characterized by the inability to pass a complete bowel movement.  She would expel small amounts of stool throughout the day and experienced leakage as well.  Stools were soft.  Subsequently she is back having normal solid bowel movements but she still may pass small amounts of stool throughout the day.  She is aware that this occurs.  She has had rectal discomfort during episodes of frequent soft stool and constipation.  She denies abdominal pain or rectal bleeding.  She has used topicals in the past.  She claims that her last colonoscopy was over 10 years ago.   Past Medical History  Diagnosis Date  . H/O hydronephrosis   . Hypertension   . Emphysema   . COPD (chronic obstructive pulmonary disease)    Past Surgical History  Procedure Laterality Date  . Nephrectomy    . Tonsilectomy, adenoidectomy, bilateral myringotomy and tubes    . Tubal ligation     family history includes Diabetes in her father and paternal grandfather; Liver cancer in her father and mother. Current Outpatient Prescriptions  Medication Sig Dispense Refill  . calcium carbonate (OS-CAL) 600 MG TABS Take 600 mg by mouth daily with breakfast.     . cholecalciferol (VITAMIN D) 1000 UNITS tablet Take 1,000 Units by mouth daily.    . fish oil-omega-3 fatty acids 1000 MG capsule Take 1 g by mouth daily.     . Multiple Vitamins-Minerals (MULTIVITAMIN WITH MINERALS) tablet Take 1 tablet by mouth daily.    Marland Kitchen PROAIR HFA 108 (90 BASE) MCG/ACT inhaler INHALE 2 PUFFS BY MOUTH FOUR TIMES DAILY AS NEEDED FOR WHEEZING 8.5 g 0  . SYMBICORT 160-4.5 MCG/ACT inhaler INHALE 2 PUFFS BY MOUTH TWICE DAILY 10.2 g 6  .  Tiotropium Bromide Monohydrate (SPIRIVA RESPIMAT) 2.5 MCG/ACT AERS Inhale 2 puffs into the lungs daily. 1 Inhaler 5  . triamterene-hydrochlorothiazide (MAXZIDE-25) 37.5-25 MG per tablet Take 1 tablet by mouth daily.      No current facility-administered medications for this visit.   Allergies as of 06/18/2014 - Review Complete 06/18/2014  Allergen Reaction Noted  . Penicillins  03/06/2012    reports that she quit smoking about 11 months ago. Her smoking use included Cigarettes. She has a 40 pack-year smoking history. She has never used smokeless tobacco. She reports that she drinks alcohol. She reports that she does not use illicit drugs.   Review of Systems: Pertinent positive and negative review of systems were noted in the above HPI section. All other review of systems were otherwise negative.  Vital signs were reviewed in today's medical record Physical Exam: General: Well developed , well nourished, no acute distress  Skin: anicteric Head: Normocephalic and atraumatic Eyes:  sclerae anicteric, EOMI Ears: Normal auditory acuity Mouth: No deformity or lesions Neck: Supple, no masses or thyromegaly Lungs: Clear throughout to auscultation Heart: Regular rate and rhythm; no murmurs, rubs or bruits Abdomen: Soft, non tender and non distended. No masses, hepatosplenomegaly or hernias noted. Normal Bowel sounds Rectal: There are no masses or external lesions.  Stool is Hemoccult negative Musculoskeletal: Symmetrical with no gross deformities  Skin: No lesions on visible extremities Pulses:  Normal pulses noted Extremities: No clubbing, cyanosis, edema or deformities noted Neurological: Alert oriented x 4, grossly nonfocal Cervical Nodes:  No significant cervical adenopathy Inguinal Nodes: No significant inguinal adenopathy Psychological:  Alert and cooperative. Normal mood and affect  See Assessment and Plan under Problem List

## 2014-06-18 NOTE — Assessment & Plan Note (Signed)
Pulmonary function appears stable

## 2014-06-18 NOTE — Patient Instructions (Signed)
You have been scheduled for a colonoscopy. Please follow written instructions given to you at your visit today.  Please pick up your prep kit at the pharmacy within the next 1-3 days. If you use inhalers (even only as needed), please bring them with you on the day of your procedure. Your physician has requested that you go to www.startemmi.com and enter the access code given to you at your visit today. This web site gives a general overview about your procedure. However, you should still follow specific instructions given to you by our office regarding your preparation for the procedure.  Your Suprep will be sent to your pharmacy

## 2014-07-07 ENCOUNTER — Telehealth: Payer: Self-pay | Admitting: Pulmonary Disease

## 2014-07-08 NOTE — Telephone Encounter (Signed)
LM for pt to return call.  Rx sent for ProAir to pharmacy.

## 2014-07-08 NOTE — Telephone Encounter (Signed)
Patient calling to inform us that the pharm is faxing something over to get rx filled.

## 2014-07-08 NOTE — Telephone Encounter (Signed)
Pt returning call - [phone]

## 2014-07-08 NOTE — Telephone Encounter (Signed)
Left detailed message of rx being to sent to pharmacy ([facility] on [address] and [address]) and to call back if needed. Nothing further needed.

## 2014-08-13 ENCOUNTER — Ambulatory Visit (AMBULATORY_SURGERY_CENTER): Payer: Medicare Other | Admitting: Gastroenterology

## 2014-08-13 ENCOUNTER — Encounter: Payer: Self-pay | Admitting: Gastroenterology

## 2014-08-13 VITALS — BP 140/80 | HR 81 | Temp 96.2°F | Resp 19 | Ht 61.0 in | Wt 171.0 lb

## 2014-08-13 DIAGNOSIS — R159 Full incontinence of feces: Secondary | ICD-10-CM

## 2014-08-13 DIAGNOSIS — D125 Benign neoplasm of sigmoid colon: Secondary | ICD-10-CM

## 2014-08-13 DIAGNOSIS — R194 Change in bowel habit: Secondary | ICD-10-CM | POA: Diagnosis not present

## 2014-08-13 DIAGNOSIS — K635 Polyp of colon: Secondary | ICD-10-CM

## 2014-08-13 DIAGNOSIS — K5730 Diverticulosis of large intestine without perforation or abscess without bleeding: Secondary | ICD-10-CM | POA: Diagnosis not present

## 2014-08-13 DIAGNOSIS — J40 Bronchitis, not specified as acute or chronic: Secondary | ICD-10-CM | POA: Diagnosis not present

## 2014-08-13 DIAGNOSIS — I10 Essential (primary) hypertension: Secondary | ICD-10-CM | POA: Diagnosis not present

## 2014-08-13 DIAGNOSIS — J449 Chronic obstructive pulmonary disease, unspecified: Secondary | ICD-10-CM | POA: Diagnosis not present

## 2014-08-13 DIAGNOSIS — D127 Benign neoplasm of rectosigmoid junction: Secondary | ICD-10-CM

## 2014-08-13 MED ORDER — SODIUM CHLORIDE 0.9 % IV SOLN: 500.0000 mL | INTRAVENOUS | Status: DC

## 2014-08-13 NOTE — Op Note (Signed)
[place]  [address] [place], [id number]   COLONOSCOPY PROCEDURE REPORT  PATIENT: Lynn Ramirez, Lynn Ramirez  MR#: [id number] BIRTHDATE: [birth year] , 73  yrs. old GENDER: female ENDOSCOPIST: Inda Castle, MD REFERRED BY: PROCEDURE DATE:  08/13/2014 PROCEDURE:   Colonoscopy with snare polypectomy , incomplete colonoscopy First Screening Colonoscopy - Avg.  risk and is 50 yrs.  old or older - No.  Prior Negative Screening - Now for repeat screening. 10 or more years since last screening  History of Adenoma - Now for follow-up colonoscopy & has been > or = to 3 yrs.  N/A  Polyps Removed Today? Yes. ASA CLASS:   Class II INDICATIONS:encopresis. MEDICATIONS: Monitored anesthesia care, Propofol 600 mg IV, Glucagon 1 mg IV, and lidocaine 40 mg IV  DESCRIPTION OF PROCEDURE:   After the risks benefits and alternatives of the procedure were thoroughly explained, informed consent was obtained.  The digital rectal exam revealed no abnormalities of the rectum.   The LB [id number] and LB [id number]  endoscope was introduced through the anus and advanced to the sigmoid colon. There was intense spasm with luminal narrowing, muscular hypertrophy in association with severe diverticular disease.  Despite multiple attempts with both the adult in the pediatric colonoscope, I was unable to pass the scope beyond the mid sigmoid.  Scope insertion was attempted for 31 minutes No adverse events experienced.   The quality of the prep was Suprep good  The instrument was then slowly withdrawn as the colon was fully examined.      COLON FINDINGS: A sessile polyp measuring 5 mm in size was found in the sigmoid colon.  A polypectomy was performed with a cold snare. The resection was complete, the polyp tissue was completely retrieved and sent to histology.   There was severe diverticulosis noted in the sigmoid colon with associated luminal narrowing, colonic spasm,  angulation and muscular hypertrophy.   A pedunculated polyp measuring 4 mm in size was found in the sigmoid colon.  A polypectomy was performed with a cold snare.  The resection was complete, the polyp tissue was completely retrieved and sent to histology.   Internal hemorrhoids were found.     The time to cecum=  .  Withdrawal time=  .  The scope was withdrawn and the procedure completed. COMPLICATIONS: There were no immediate complications.  ENDOSCOPIC IMPRESSION: 1.  colon polyps 2.  Severe diverticular disease 3.  Internal hemorrhoids 4.  Incomplete colonoscopy  RECOMMENDATIONS: Await biopsy results referred to colorectal surgery for evaluation of encopresis  eSigned:  Inda Castle, MD 08/13/2014 4:31 PM   cc: Antony Blackbird, MD , Leighton Ruff, MD   PATIENT NAME:  Lynn Ramirez, Lynn Ramirez MR#: [id number]

## 2014-08-13 NOTE — Progress Notes (Signed)
Stable to RR 

## 2014-08-13 NOTE — Progress Notes (Signed)
Called to room to assist during endoscopic procedure.  Patient ID and intended procedure confirmed with present staff. Received instructions for my participation in the procedure from the performing physician.  

## 2014-08-13 NOTE — Patient Instructions (Signed)
YOU HAD AN ENDOSCOPIC PROCEDURE TODAY AT THE [facility]: Refer to the procedure report that was given to you for any specific questions about what was found during the examination.  If the procedure report does not answer your questions, please call your gastroenterologist to clarify.  If you requested that your care partner not be given the details of your procedure findings, then the procedure report has been included in a sealed envelope for you to review at your convenience later.  YOU SHOULD EXPECT: Some feelings of bloating in the abdomen. Passage of more gas than usual.  Walking can help get rid of the air that was put into your GI tract during the procedure and reduce the bloating. If you had a lower endoscopy (such as a colonoscopy or flexible sigmoidoscopy) you may notice spotting of blood in your stool or on the toilet paper. If you underwent a bowel prep for your procedure, then you may not have a normal bowel movement for a few days.  DIET: Your first meal following the procedure should be a light meal and then it is ok to progress to your normal diet.  A half-sandwich or bowl of soup is an example of a good first meal.  Heavy or fried foods are harder to digest and may make you feel nauseous or bloated.  Likewise meals heavy in dairy and vegetables can cause extra gas to form and this can also increase the bloating.  Drink plenty of fluids but you should avoid alcoholic beverages for 24 hours.  ACTIVITY: Your care partner should take you home directly after the procedure.  You should plan to take it easy, moving slowly for the rest of the day.  You can resume normal activity the day after the procedure however you should NOT DRIVE or use heavy machinery for 24 hours (because of the sedation medicines used during the test).    SYMPTOMS TO REPORT IMMEDIATELY: A gastroenterologist can be reached at any hour.  During normal business hours, 8:30 AM to 5:00 PM Monday through Friday,  call [phone].  After hours and on weekends, please call the GI answering service at [phone] who will take a message and have the physician on call contact you.   Following lower endoscopy (colonoscopy or flexible sigmoidoscopy):  Excessive amounts of blood in the stool  Significant tenderness or worsening of abdominal pains  Swelling of the abdomen that is new, acute  Fever of 100F or higher  FOLLOW UP: If any biopsies were taken you will be contacted by phone or by letter within the next 1-3 weeks.  Call your gastroenterologist if you have not heard about the biopsies in 3 weeks.  Our staff will call the home number listed on your records the next business day following your procedure to check on you and address any questions or concerns that you may have at that time regarding the information given to you following your procedure. This is a courtesy call and so if there is no answer at the home number and we have not heard from you through the emergency physician on call, we will assume that you have returned to your regular daily activities without incident.  SIGNATURES/CONFIDENTIALITY: You and/or your care partner have signed paperwork which will be entered into your electronic medical record.  These signatures attest to the fact that that the information above on your After Visit Summary has been reviewed and is understood.  Full responsibility of the confidentiality of this   discharge information lies with you and/or your care-partner.  Polyps, diverticulosis, high fiber diet, hemorrhoids-handouts given  Wait biopsy results.  Referred to colorectal surgery for evaluation of encopresis.

## 2014-08-14 ENCOUNTER — Telehealth: Payer: Self-pay

## 2014-08-14 NOTE — Telephone Encounter (Signed)
Left a message at [phone] for the pt to call us back if any questions or concerns. aw

## 2014-08-21 ENCOUNTER — Encounter: Payer: Self-pay | Admitting: Gastroenterology

## 2014-08-31 DIAGNOSIS — R159 Full incontinence of feces: Secondary | ICD-10-CM | POA: Diagnosis not present

## 2014-09-18 DIAGNOSIS — I10 Essential (primary) hypertension: Secondary | ICD-10-CM | POA: Diagnosis not present

## 2014-09-18 DIAGNOSIS — E785 Hyperlipidemia, unspecified: Secondary | ICD-10-CM | POA: Diagnosis not present

## 2014-09-18 DIAGNOSIS — Z905 Acquired absence of kidney: Secondary | ICD-10-CM | POA: Diagnosis not present

## 2014-09-18 DIAGNOSIS — J449 Chronic obstructive pulmonary disease, unspecified: Secondary | ICD-10-CM | POA: Diagnosis not present

## 2014-09-18 DIAGNOSIS — Z79899 Other long term (current) drug therapy: Secondary | ICD-10-CM | POA: Diagnosis not present

## 2014-09-18 DIAGNOSIS — R7301 Impaired fasting glucose: Secondary | ICD-10-CM | POA: Diagnosis not present

## 2014-09-18 DIAGNOSIS — Z6834 Body mass index (BMI) 34.0-34.9, adult: Secondary | ICD-10-CM | POA: Diagnosis not present

## 2014-09-18 DIAGNOSIS — J309 Allergic rhinitis, unspecified: Secondary | ICD-10-CM | POA: Diagnosis not present

## 2014-09-27 ENCOUNTER — Telehealth: Payer: Self-pay | Admitting: Pulmonary Disease

## 2014-09-27 NOTE — Telephone Encounter (Signed)
Called d/t increased SOB, cough and productive sputum (clear, but thick). Call office for appointment in AM. Will call in prescription for: Azithromycin 5 day Z pack and Prednisone 10 mg dose pack to [facility] [phone]. Patient instructed to come to Emergency Department if she should get worse.

## 2014-09-28 ENCOUNTER — Telehealth: Payer: Self-pay | Admitting: Pulmonary Disease

## 2014-09-28 ENCOUNTER — Ambulatory Visit (INDEPENDENT_AMBULATORY_CARE_PROVIDER_SITE_OTHER): Payer: Medicare Other | Admitting: Pulmonary Disease

## 2014-09-28 ENCOUNTER — Encounter: Payer: Self-pay | Admitting: Pulmonary Disease

## 2014-09-28 ENCOUNTER — Ambulatory Visit (INDEPENDENT_AMBULATORY_CARE_PROVIDER_SITE_OTHER)
Admission: RE | Admit: 2014-09-28 | Discharge: 2014-09-28 | Disposition: A | Discharge status: Home / Self Care | Payer: Medicare Other | Source: Ambulatory Visit | Attending: Pulmonary Disease | Admitting: Pulmonary Disease

## 2014-09-28 VITALS — BP 146/78 | HR 106 | Temp 98.2°F | Ht 61.0 in | Wt 178.4 lb

## 2014-09-28 DIAGNOSIS — R0602 Shortness of breath: Secondary | ICD-10-CM | POA: Diagnosis not present

## 2014-09-28 DIAGNOSIS — R05 Cough: Secondary | ICD-10-CM | POA: Diagnosis not present

## 2014-09-28 DIAGNOSIS — J441 Chronic obstructive pulmonary disease with (acute) exacerbation: Secondary | ICD-10-CM

## 2014-09-28 NOTE — Telephone Encounter (Signed)
Please see phone note from 09/28/14. Will sign off.

## 2014-09-28 NOTE — Patient Instructions (Signed)
Finish up your antibiotic and prednisone taper No change in your breathing medications Will check a chest xray today, and call you with results. Will start on oxygen 24 hrs a day until you are better. Would like to see you back in 2 weeks, but call if you are not improving.

## 2014-09-28 NOTE — Progress Notes (Signed)
   Subjective:    Patient ID: Lynn Ramirez, female    DOB: 08/07/1940, 74 y.o.   MRN: [id number]  HPI The patient comes in today for an acute sick visit. She has known COPD, and was doing fairly well until approximately 4 days ago when she began to develop increasing cough and chest congestion. Is followed by increasing shortness of breath, and she called the physician on call last night and was given an antibiotic and prednisone taper that she has started. She has had some purulent mucus today, but does not feel any better. I have reminded her it is only been one day. It should be noted that her saturations are 85% this afternoon, but she is not in any respiratory distress. She has had no fevers, chills, or sweats.   Review of Systems  Constitutional: Negative for fever, chills and unexpected weight change.  HENT: Positive for congestion and postnasal drip. Negative for dental problem, ear pain, nosebleeds, rhinorrhea, sinus pressure, sneezing, sore throat, trouble swallowing and voice change.   Eyes: Negative for redness, itching and visual disturbance.  Respiratory: Positive for cough, shortness of breath and wheezing. Negative for choking and chest tightness.   Cardiovascular: Negative for chest pain, palpitations and leg swelling.  Gastrointestinal: Negative for nausea, vomiting, abdominal pain and diarrhea.  Genitourinary: Negative for dysuria and difficulty urinating.  Musculoskeletal: Negative for joint swelling and arthralgias.  Skin: Negative for rash.  Neurological: Negative for tremors, syncope and headaches.  Hematological: Does not bruise/bleed easily.  Psychiatric/Behavioral: Negative for dysphoric mood. The patient is not nervous/anxious.        Objective:   Physical Exam Obese female in no acute distress Nose without purulence or discharge noted Neck without lymphadenopathy or thyromegaly Chest with decreased breath sounds, no crackles or wheezes Cardiac exam with  mildly tachycardic but regular rhythm Lower extremities without edema, no cyanosis Alert and oriented, moves all 4 extremities.       Assessment & Plan:

## 2014-09-28 NOTE — Telephone Encounter (Signed)
Pt is here being seen now. Will sign off message

## 2014-09-28 NOTE — Telephone Encounter (Signed)
P c/o increased SOB, cough and recent mucus production -- pt has not coughed up any mucus today.  Pt reports having some lower back soreness at base of right lungs.  Pt has been using albuterol HFA more frequently.  Given abx 09/27/14 by on call MD - pt was advised to contact Dr Gwenette Greet today   Pt given appt today with [facility] at 4:15 - pt would like to know if this appt is needed at time. If so, can she have a cxr prior to appt.   Anders Simmonds, MD at 09/27/2014 7:32 PM     Status: Signed       Expand All Collapse All   Called d/t increased SOB, cough and productive sputum (clear, but thick). Call office for appointment in AM. Will call in prescription for: Azithromycin 5 day Z pack and Prednisone 10 mg dose pack to [facility] [phone]. Patient instructed to come to Emergency Department if she should get worse.        Allergies  Allergen Reactions  . Penicillins    Please advise [place]. Thanks.

## 2014-09-28 NOTE — Assessment & Plan Note (Signed)
The patient is clearly having an acute COPD exacerbation associated with a pulmonary infection. It is unclear if she just simply has acute bronchitis, or whether she may have some underlying pneumonia. She is hypoxic at rest, but not having any acute respiratory distress or increased work of breathing. She is already on an antibiotic and prednisone, and we will add oxygen 24 hours a day until she improves.

## 2014-09-28 NOTE — Telephone Encounter (Signed)
lmtcb for pt.  

## 2014-09-29 ENCOUNTER — Other Ambulatory Visit: Payer: Self-pay | Admitting: Pulmonary Disease

## 2014-09-29 ENCOUNTER — Telehealth: Payer: Self-pay | Admitting: Pulmonary Disease

## 2014-09-29 DIAGNOSIS — J910 Malignant pleural effusion: Secondary | ICD-10-CM | POA: Insufficient documentation

## 2014-09-29 DIAGNOSIS — J90 Pleural effusion, not elsewhere classified: Secondary | ICD-10-CM

## 2014-09-29 NOTE — Telephone Encounter (Signed)
done

## 2014-09-29 NOTE — Telephone Encounter (Signed)
Pt would like the results from her CXR.  Lynn Ramirez - please advise. Thanks.

## 2014-09-30 ENCOUNTER — Ambulatory Visit (HOSPITAL_COMMUNITY)
Admission: RE | Admit: 2014-09-30 | Discharge: 2014-09-30 | Disposition: A | Discharge status: Home / Self Care | Payer: Medicare Other | Source: Ambulatory Visit | Attending: Radiology | Admitting: Radiology

## 2014-09-30 ENCOUNTER — Ambulatory Visit (HOSPITAL_COMMUNITY)
Admission: RE | Admit: 2014-09-30 | Discharge: 2014-09-30 | Disposition: A | Discharge status: Home / Self Care | Payer: Medicare Other | Source: Ambulatory Visit | Attending: Pulmonary Disease | Admitting: Pulmonary Disease

## 2014-09-30 DIAGNOSIS — J9811 Atelectasis: Secondary | ICD-10-CM | POA: Diagnosis not present

## 2014-09-30 DIAGNOSIS — J948 Other specified pleural conditions: Secondary | ICD-10-CM | POA: Insufficient documentation

## 2014-09-30 DIAGNOSIS — Z9889 Other specified postprocedural states: Secondary | ICD-10-CM

## 2014-09-30 DIAGNOSIS — C384 Malignant neoplasm of pleura: Secondary | ICD-10-CM | POA: Diagnosis not present

## 2014-09-30 DIAGNOSIS — J90 Pleural effusion, not elsewhere classified: Secondary | ICD-10-CM

## 2014-09-30 DIAGNOSIS — J449 Chronic obstructive pulmonary disease, unspecified: Secondary | ICD-10-CM | POA: Diagnosis not present

## 2014-09-30 LAB — LACTATE DEHYDROGENASE, PLEURAL OR PERITONEAL FLUID: LD, Fluid: 397 U/L — ABNORMAL HIGH (ref 3–23)

## 2014-09-30 LAB — BODY FLUID CELL COUNT WITH DIFFERENTIAL
Eos, Fluid: 1 %
LYMPHS FL: 66 %
MONOCYTE-MACROPHAGE-SEROUS FLUID: 6 % — AB (ref 50–90)
NEUTROPHIL FLUID: 27 % — AB (ref 0–25)
Total Nucleated Cell Count, Fluid: 1456 cu mm — ABNORMAL HIGH (ref 0–1000)

## 2014-09-30 LAB — GLUCOSE, SEROUS FLUID: Glucose, Fluid: 78 mg/dL

## 2014-09-30 LAB — PROTEIN, BODY FLUID: Total protein, fluid: 4.9 g/dL

## 2014-09-30 NOTE — Telephone Encounter (Signed)
Called and spoke to pt. Pt stated Lynn Ramirez has already spoken to pt about results. Nothing further needed.

## 2014-09-30 NOTE — Procedures (Signed)
US guided diagnostic/therapeutic right thoracentesis performed yielding 1.6 liters bloody fluid. The fluid was sent to the lab for preordered studies. F/u CXR pending. No immediate complications.

## 2014-10-01 LAB — AMYLASE, PLEURAL FLUID: Amylase, Pleural Fluid: 45 U/L

## 2014-10-02 ENCOUNTER — Ambulatory Visit (HOSPITAL_COMMUNITY): Payer: Medicare Other

## 2014-10-04 LAB — BODY FLUID CULTURE
CULTURE: NO GROWTH
GRAM STAIN: NONE SEEN
SPECIAL REQUESTS: NORMAL

## 2014-10-05 ENCOUNTER — Other Ambulatory Visit: Payer: Self-pay | Admitting: Pulmonary Disease

## 2014-10-05 DIAGNOSIS — R1900 Intra-abdominal and pelvic swelling, mass and lump, unspecified site: Secondary | ICD-10-CM

## 2014-10-05 DIAGNOSIS — J90 Pleural effusion, not elsewhere classified: Secondary | ICD-10-CM

## 2014-10-06 ENCOUNTER — Other Ambulatory Visit (INDEPENDENT_AMBULATORY_CARE_PROVIDER_SITE_OTHER): Payer: Medicare Other

## 2014-10-06 ENCOUNTER — Other Ambulatory Visit: Payer: Self-pay | Admitting: Pulmonary Disease

## 2014-10-06 ENCOUNTER — Ambulatory Visit (INDEPENDENT_AMBULATORY_CARE_PROVIDER_SITE_OTHER)
Admission: RE | Admit: 2014-10-06 | Discharge: 2014-10-06 | Disposition: A | Discharge status: Home / Self Care | Payer: Medicare Other | Source: Ambulatory Visit | Attending: Pulmonary Disease | Admitting: Pulmonary Disease

## 2014-10-06 DIAGNOSIS — J90 Pleural effusion, not elsewhere classified: Secondary | ICD-10-CM | POA: Diagnosis not present

## 2014-10-06 DIAGNOSIS — R1900 Intra-abdominal and pelvic swelling, mass and lump, unspecified site: Secondary | ICD-10-CM | POA: Diagnosis not present

## 2014-10-06 DIAGNOSIS — D3502 Benign neoplasm of left adrenal gland: Secondary | ICD-10-CM | POA: Diagnosis not present

## 2014-10-06 DIAGNOSIS — K8020 Calculus of gallbladder without cholecystitis without obstruction: Secondary | ICD-10-CM | POA: Diagnosis not present

## 2014-10-06 DIAGNOSIS — R188 Other ascites: Secondary | ICD-10-CM | POA: Diagnosis not present

## 2014-10-06 LAB — BASIC METABOLIC PANEL
BUN: 24 mg/dL — ABNORMAL HIGH (ref 6–23)
CO2: 41 mEq/L — ABNORMAL HIGH (ref 19–32)
Calcium: 9.6 mg/dL (ref 8.4–10.5)
Chloride: 92 mEq/L — ABNORMAL LOW (ref 96–112)
Creatinine, Ser: 0.95 mg/dL (ref 0.40–1.20)
GFR: 61.55 mL/min (ref >60.00)
GLUCOSE: 107 mg/dL — AB (ref 70–99)
POTASSIUM: 4.2 meq/L (ref 3.5–5.1)
SODIUM: 135 meq/L (ref 135–145)

## 2014-10-06 MED ORDER — IOHEXOL 300 MG/ML  SOLN
100.0000 mL | Freq: Once | INTRAMUSCULAR | Status: AC | PRN
  Administered 2014-10-06: 100 mL via INTRAVENOUS

## 2014-10-07 ENCOUNTER — Other Ambulatory Visit: Payer: Self-pay | Admitting: Pulmonary Disease

## 2014-10-07 DIAGNOSIS — J90 Pleural effusion, not elsewhere classified: Secondary | ICD-10-CM

## 2014-10-07 DIAGNOSIS — R1900 Intra-abdominal and pelvic swelling, mass and lump, unspecified site: Secondary | ICD-10-CM

## 2014-10-08 ENCOUNTER — Ambulatory Visit (INDEPENDENT_AMBULATORY_CARE_PROVIDER_SITE_OTHER)
Admission: RE | Admit: 2014-10-08 | Discharge: 2014-10-08 | Disposition: A | Discharge status: Home / Self Care | Payer: Medicare Other | Source: Ambulatory Visit | Attending: Internal Medicine | Admitting: Internal Medicine

## 2014-10-08 ENCOUNTER — Ambulatory Visit (INDEPENDENT_AMBULATORY_CARE_PROVIDER_SITE_OTHER): Payer: Medicare Other | Admitting: Internal Medicine

## 2014-10-08 ENCOUNTER — Telehealth: Payer: Self-pay | Admitting: Oncology

## 2014-10-08 ENCOUNTER — Telehealth: Payer: Self-pay | Admitting: Pulmonary Disease

## 2014-10-08 ENCOUNTER — Other Ambulatory Visit (INDEPENDENT_AMBULATORY_CARE_PROVIDER_SITE_OTHER): Payer: Medicare Other

## 2014-10-08 ENCOUNTER — Encounter: Payer: Self-pay | Admitting: Internal Medicine

## 2014-10-08 VITALS — BP 122/78 | HR 109 | Ht 61.0 in | Wt 177.0 lb

## 2014-10-08 DIAGNOSIS — J90 Pleural effusion, not elsewhere classified: Secondary | ICD-10-CM

## 2014-10-08 DIAGNOSIS — G4700 Insomnia, unspecified: Secondary | ICD-10-CM

## 2014-10-08 DIAGNOSIS — J439 Emphysema, unspecified: Secondary | ICD-10-CM

## 2014-10-08 DIAGNOSIS — R0600 Dyspnea, unspecified: Secondary | ICD-10-CM | POA: Diagnosis not present

## 2014-10-08 DIAGNOSIS — J948 Other specified pleural conditions: Secondary | ICD-10-CM | POA: Diagnosis not present

## 2014-10-08 DIAGNOSIS — D689 Coagulation defect, unspecified: Secondary | ICD-10-CM

## 2014-10-08 DIAGNOSIS — R0602 Shortness of breath: Secondary | ICD-10-CM | POA: Diagnosis not present

## 2014-10-08 DIAGNOSIS — J449 Chronic obstructive pulmonary disease, unspecified: Secondary | ICD-10-CM | POA: Diagnosis not present

## 2014-10-08 LAB — APTT: aPTT: 27 s (ref 23.4–32.7)

## 2014-10-08 LAB — PROTIME-INR
INR: 1.1 ratio — ABNORMAL HIGH (ref 0.8–1.0)
PROTHROMBIN TIME: 12.3 s (ref 9.6–13.1)

## 2014-10-08 MED ORDER — ZALEPLON 5 MG PO CAPS: 5.0000 mg | ORAL_CAPSULE | Freq: Every evening | ORAL | Status: DC | PRN

## 2014-10-08 NOTE — Telephone Encounter (Signed)
Per CT results: Notes Recorded by Kathee Delton, MD on 10/07/2014 at 5:42 PM Called and discussed in detail with pt. Will need to get her referred to oncology for evaluation. I also asked her to call me if she has worsening sob, and we can discuss draining her right effusion again if needed.  Called spoke with pt. She is scheduled to come in and see CDY today at 4 w/ cxr prior per CDy. Will make [facility] aware

## 2014-10-08 NOTE — Patient Instructions (Signed)
Order- Schedule Interventional Radiology ultrasound guided thoracentesis for Right side malignant pleural effusion, dyspnea   Urgent- tomorrow Friday 4/1 if possible. [place] if possible.   Order- lab PT, PTT  Script for [place] to help with sleep

## 2014-10-08 NOTE — Progress Notes (Signed)
10/08/14 55 yoF followed by Dr Gwenette Greet for COPD. She presented with increased dyspnea and had R thoracentesis 3/23 for 1.6 L bloody effusion [place] CA With referral made to oncology for April 5. CT chest 3/29. Called now for work in because of increasing dyspnea. SOB started suddenly today; pain in RT side up to shoulder, comes and goes; Abd CT concerning for peritoneal carcinomatosis CXR 10/08/14 IMPRESSION: Large right-sided pleural effusion not greatly changed from the CT scan of 2 days ago. It has increased in volume since the study of September 30, 2014. Electronically Signed  By: David Martinique  On: 10/08/2014 15:39  ROS-see HPI   Negative unless "+" Constitutional:    weight loss, night sweats, fevers, chills, fatigue, lassitude. HEENT:    headaches, difficulty swallowing, tooth/dental problems, sore throat,       sneezing, itching, ear ache, nasal congestion, post nasal drip, snoring CV:    +chest pain, orthopnea, PND, swelling in lower extremities, anasarca,                                  dizziness, palpitations Resp:   +shortness of breath with exertion or at rest.                productive cough,   non-productive cough, coughing up of blood.              change in color of mucus.  wheezing.   Skin:    rash or lesions. GI:  No-   heartburn, indigestion, abdominal pain, nausea, vomiting, GU:     dysuria, change in color of urine, no urgency or frequency.   flank pain. MS:   joint pain, stiffness, decreased range of motion, back pain. Neuro-     nothing unusual Psych:  change in mood or affect.  depression or anxiety.   memory loss.  OBJ- Physical Exam  O2 General- Alert, Oriented, Affect-appropriate, Distress- none acute Skin- rash-none, lesions- none, excoriation- none Lymphadenopathy- none Head- atraumatic            Eyes- Gross vision intact, PERRLA, conjunctivae and secretions clear            Ears- Hearing, canals-normal            Nose- Clear, no-Septal dev, mucus,  polyps, erosion, perforation             Throat- Mallampati II , mucosa clear , drainage- none, tonsils- atrophic Neck- flexible , trachea midline, no stridor , thyroid nl, carotid no bruit Chest - symmetrical excursion , unlabored           Heart/CV- RRR , no murmur , no gallop  , no rub, nl s1 s2                           - JVD- none , edema- none, stasis changes- none, varices- none           Lung- dull w/o rub R lower half chest, wheeze- none, cough- none ,            Chest wall-  Abd-  Br/ Gen/ Rectal- Not done, not indicated Extrem- cyanosis- none, clubbing, none, atrophy- none, strength- nl Neuro- grossly intact to observation

## 2014-10-08 NOTE — Telephone Encounter (Signed)
Chart delivered 10/08/14

## 2014-10-08 NOTE — Telephone Encounter (Signed)
Pt aware of appt 10/13/14@1 :30

## 2014-10-09 ENCOUNTER — Ambulatory Visit (HOSPITAL_COMMUNITY)
Admission: RE | Admit: 2014-10-09 | Discharge: 2014-10-09 | Disposition: A | Discharge status: Home / Self Care | Payer: Medicare Other | Source: Ambulatory Visit | Attending: Radiology | Admitting: Radiology

## 2014-10-09 ENCOUNTER — Ambulatory Visit (HOSPITAL_COMMUNITY)
Admission: RE | Admit: 2014-10-09 | Discharge: 2014-10-09 | Disposition: A | Discharge status: Home / Self Care | Payer: Medicare Other | Source: Ambulatory Visit | Attending: Internal Medicine | Admitting: Internal Medicine

## 2014-10-09 ENCOUNTER — Ambulatory Visit: Payer: Medicare Other | Admitting: Pulmonary Disease

## 2014-10-09 DIAGNOSIS — Z9889 Other specified postprocedural states: Secondary | ICD-10-CM

## 2014-10-09 DIAGNOSIS — J90 Pleural effusion, not elsewhere classified: Secondary | ICD-10-CM | POA: Insufficient documentation

## 2014-10-09 NOTE — Procedures (Signed)
US guided therapeutic right thoracentesis performed yielding 1.8 liters bloody fluid. F/u CXR pending. No immediate complications. Only the above amount of fluid was removed today secondary to pt coughing/shoulder discomfort.

## 2014-10-11 DIAGNOSIS — G4700 Insomnia, unspecified: Secondary | ICD-10-CM | POA: Insufficient documentation

## 2014-10-11 NOTE — Assessment & Plan Note (Signed)
Asks help for difficulty sleeping blamed on situational anxiety and somatic discomfort. Discussed risk benefit. Can offer a short acting med for trial. Plan-[place]

## 2014-10-11 NOTE — Assessment & Plan Note (Signed)
Recurrent malignant pleural effusion, symptomatic Plan-therapeutic thoracentesis to make her more comfortable this weekend pending oncology appointment to establish.

## 2014-10-12 ENCOUNTER — Telehealth: Payer: Self-pay | Admitting: *Deleted

## 2014-10-12 ENCOUNTER — Telehealth: Payer: Self-pay | Admitting: Pulmonary Disease

## 2014-10-12 NOTE — Progress Notes (Signed)
Quick Note:  Pt aware - see 10/12/14 phone msg. ______

## 2014-10-12 NOTE — Telephone Encounter (Signed)
Spoke with Dr. Benay Spice about appt.  He stated he is aware of appt and would like her to be set up with GYN surgery.  I spoke with GYN scheduling.  I received an appt and called patient.  I left vm message to call with appt.

## 2014-10-12 NOTE — Telephone Encounter (Signed)
Notes Recorded by Deneise Lever, MD on 10/08/2014 at 8:16 PM Normal PT and PTT measures of blood clotting  --------  Called, spoke with pt.  Discussed lab results per. Dr. Annamaria Boots.  She verbalized understanding and voiced no further questions or concerns at this time.

## 2014-10-13 ENCOUNTER — Ambulatory Visit (HOSPITAL_BASED_OUTPATIENT_CLINIC_OR_DEPARTMENT_OTHER): Payer: Medicare Other | Admitting: Oncology

## 2014-10-13 ENCOUNTER — Encounter: Payer: Self-pay | Admitting: Oncology

## 2014-10-13 ENCOUNTER — Ambulatory Visit: Payer: Medicare Other

## 2014-10-13 ENCOUNTER — Other Ambulatory Visit (HOSPITAL_BASED_OUTPATIENT_CLINIC_OR_DEPARTMENT_OTHER): Payer: Medicare Other

## 2014-10-13 ENCOUNTER — Ambulatory Visit (HOSPITAL_COMMUNITY)
Admission: RE | Admit: 2014-10-13 | Discharge: 2014-10-13 | Disposition: A | Discharge status: Home / Self Care | Payer: Medicare Other | Source: Ambulatory Visit | Attending: Oncology | Admitting: Oncology

## 2014-10-13 ENCOUNTER — Telehealth: Payer: Self-pay | Admitting: Oncology

## 2014-10-13 VITALS — BP 155/68 | HR 102 | Temp 98.0°F | Resp 18 | Ht 61.0 in | Wt 181.0 lb

## 2014-10-13 DIAGNOSIS — C482 Malignant neoplasm of peritoneum, unspecified: Secondary | ICD-10-CM | POA: Insufficient documentation

## 2014-10-13 DIAGNOSIS — C569 Malignant neoplasm of unspecified ovary: Secondary | ICD-10-CM

## 2014-10-13 DIAGNOSIS — J910 Malignant pleural effusion: Secondary | ICD-10-CM | POA: Diagnosis not present

## 2014-10-13 DIAGNOSIS — R188 Other ascites: Secondary | ICD-10-CM | POA: Diagnosis not present

## 2014-10-13 DIAGNOSIS — C801 Malignant (primary) neoplasm, unspecified: Secondary | ICD-10-CM

## 2014-10-13 DIAGNOSIS — J449 Chronic obstructive pulmonary disease, unspecified: Secondary | ICD-10-CM | POA: Diagnosis not present

## 2014-10-13 NOTE — Telephone Encounter (Signed)
gave and printed appt sched and avs for pt for April °

## 2014-10-13 NOTE — Procedures (Signed)
Ultrasound-guided diagnostic and therapeutic paracentesis performed yielding 2.6 L of amber fluid. A portion of the fluid was submitted to the laboratory for cytology. No immediate compilations.

## 2014-10-13 NOTE — Progress Notes (Signed)
Checked in new pt with no financial concerns at this time.  Pt has 2 insurances so financial assistance may not be needed but she has my card for any billing questions or concerns.  ° °

## 2014-10-13 NOTE — Progress Notes (Signed)
[facility] Patient Consult   Referring MD: Malayjah Otoole 74 y.o.  1941-03-06    Reason for Referral: Malignant pleural effusion   HPI: She reports the sudden onset of increased dyspnea last month. She has a history of COPD. She saw Dr. Gwenette Greet and a chest x-ray 09/29/2014 revealed a large right pleural effusion. She underwent an ultrasound guided thoracentesis on 09/30/2014. 1.6 L of bloody fluid were removed. The cytology ([id number]) revealed malignant cells consistent with metastatic adenocarcinoma with papillary features. A few psammoma bodiesThe tumor cells are positive for cytokeratin 7, ER,MOC31, WT-1, CD10, and p53. Stains for cytokeratin 20, TTF-1, GCDFP, and PR were negative. The immunophenotype favored a gynecologic primary.  She was referred for CTs of the chest, abdomen, and pelvis on 10/06/2014. There is a 10 mm precarinal node, large loculated right effusion with collapse of the right lower lobe, no focal hepatic lesion, status post left nephrectomy, mild nodular thickening at the posterior bladder wall, edema and thickening within the central small bowel mesentery. Markedly thickened omentum of the transverse colon. No discrete measurable nodule. Ovaries are difficult to define. No ovarian enlargement identified. The uterus appears normal. Moderate ascites throughout the abdomen. Large irregular gallstone in the lumen of the gallbladder.  She had relief of dyspnea with the initial thoracentesis. The dyspnea returned and she underwent a repeat thoracentesis for 1.8 L of bloody fluid on 10/09/2014. The dyspnea again improved.  She complains of progressive abdominal distention.  Past Medical History  Diagnosis Date  . H/O hydronephrosis as a child    . Hypertension   . Emphysema   . COPD (chronic obstructive pulmonary disease)     .   G3 P3  Past Surgical History  Procedure Laterality Date  . Nephrectomy   age 28   . Tonsilectomy,  adenoidectomy, bilateral myringotomy and tubes    . Tubal ligation/appendectomy     . Hand surgery Right 1980's    Medications: Reviewed  Allergies:  Allergies  Allergen Reactions  . Penicillins     Family history: Her father had lung cancer and was a smoker. Her mother had a "gastrointestinal "cancer. A maternal cousin died of breast cancer in her 27s  Social History:   She is a retired [employer] scientist. She lives in [place]. She quit smoking cigarettes 2-1/2 years ago. She drinks alcohol occasionally. She received a red cell transfusion at age 76. No risk factor for HIV or hepatitis.  History  Alcohol Use  . Yes    Comment: social     History  Smoking status  . Former Smoker -- 1.00 packs/day for 40 years  . Types: Cigarettes  . Quit date: 07/10/2013  Smokeless tobacco  . Never Used    Comment: Pt states that she is has struggled with smoking since Jan--1 cig here and there 04/09/14      ROS:   Positives include: Distended abdomen, dyspnea-improved following the thoracentesis 10/09/2014, restless legs, leg cramps  A complete ROS was otherwise negative.  Physical Exam:  Blood pressure 155/68, pulse 102, temperature 98 F (36.7 C), temperature source Oral, resp. rate 18, height _0  (1.549 m), weight 181 lb (82.101 kg), SpO2 96 %.  HEENT: Oropharynx without visible mass, neck without mass Lungs: Decreased breath sounds in the right lower chest, no respiratory distress Cardiac: Regular rate and rhythm Abdomen: Markedly distended, no mass, nontender, no hepatosplenomegaly  Vascular: No leg edema Lymph nodes: No cervical, supraclavicular, axillary, or inguinal nodes Neurologic:  Alert and oriented, the motor exam appears intact in the upper and lower extremities Skin: No rash Musculoskeletal: No spine tenderness Breast: Bilateral breast without mass   LAB: CEA 125 pending     Imaging:  CTs of the chest, abdomen, and pelvis from 10/06/2014-reviewed with Ms.  Shedlock and her husband  US Paracentesis  10/13/2014   INDICATION: Patient with history of adenocarcinoma of possible GYN primary, omental thickening, ascites, malignant pleural effusion; request is made for diagnostic and therapeutic paracentesis.  EXAM: ULTRASOUND-GUIDED DIAGNOSTIC AND THERAPEUTIC PARACENTESIS  COMPARISON:  None.  MEDICATIONS: None.  COMPLICATIONS: None immediate  TECHNIQUE: Informed written consent was obtained from the patient after a discussion of the risks, benefits and alternatives to treatment. A timeout was performed prior to the initiation of the procedure.  Initial ultrasound scanning demonstrates a small to moderate amount of ascites within the right lower abdominal quadrant. The right lower abdomen was prepped and draped in the usual sterile fashion. 1% lidocaine was used for local anesthesia. Under direct ultrasound guidance, a 19 gauge, 10-cm, Yueh catheter was introduced. An ultrasound image was saved for documentation purposed. The paracentesis was performed. The catheter was removed and a dressing was applied. The patient tolerated the procedure well without immediate post procedural complication.  FINDINGS: A total of approximately 2.6 liters of amber fluid was removed. Samples were sent to the laboratory as requested by the clinical team.  IMPRESSION: Successful ultrasound-guided diagnostic and therapeutic paracentesis yielding 2.6 liters of peritoneal fluid.  Read by: Rowe Robert, PA-C   Electronically Signed   By: Jerilynn Mages.  Shick M.D.   On: 10/13/2014 16:51      Assessment/Plan:   1. 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, immunohistochemical profile consistent with a GYN primary  Staging CTs of the chest, abdomen, and pelvis on 10/06/2014 revealed a loculated right pleural effusion, ascites, and omental/mesenteric thickening 2. COPD 3. Dyspnea secondary to COPD and the large right pleural effusion, status  post therapeutic thoracentesis procedures 09/30/2014 and 10/09/2014 4. Left nephrectomy as a child   Disposition:   Ms. Simenson has been diagnosed with metastatic adenocarcinoma involving a right pleural effusion. The cytology and clinical presentation is most consistent with a gynecologic primary or primary peritoneal carcinoma.. She is scheduled to be seen in GYN oncology on 10/19/2014.  We obtained a CA 125 today. She will be referred for a therapeutic paracentesis.  If she has ovarian cancer then she has stage IV disease. The plan will likely be to proceed with initial Taxol/carboplatin chemotherapy and consider surgical debulking in the future.  We discussed Taxol/carboplatin chemotherapy. I reviewed the potential toxicities associated with this regimen including the chance for nausea/vomiting, alopecia, and hematologic toxicity. We discussed the allergic reaction, neuropathy, and bone pain seen with Taxol. We discussed the potential for an allergic reaction with carboplatin.  Ms. Rowen will return for an office visit and a chemotherapy teaching class on 10/23/2014. The plan will be to initiate systemic therapy during the week of 10/26/2014. She may be a candidate for a Pleurx catheter if the pleural effusion does not respond to chemotherapy.  Approximate 50 minutes were spent with the patient today. The majority of the time was used for counseling and coordination of care.  Shadoe Cryan 10/13/2014, 5:40 PM

## 2014-10-14 ENCOUNTER — Other Ambulatory Visit: Payer: Medicare Other

## 2014-10-14 DIAGNOSIS — C801 Malignant (primary) neoplasm, unspecified: Secondary | ICD-10-CM | POA: Diagnosis not present

## 2014-10-14 DIAGNOSIS — C569 Malignant neoplasm of unspecified ovary: Secondary | ICD-10-CM | POA: Diagnosis not present

## 2014-10-14 DIAGNOSIS — J910 Malignant pleural effusion: Secondary | ICD-10-CM

## 2014-10-14 DIAGNOSIS — R188 Other ascites: Secondary | ICD-10-CM | POA: Diagnosis not present

## 2014-10-14 LAB — COMPREHENSIVE METABOLIC PANEL (CC13)
ALBUMIN: 2 g/dL — AB (ref 3.5–5.0)
ALT: 23 U/L (ref 0–55)
AST: 17 U/L (ref 5–34)
Alkaline Phosphatase: 65 U/L (ref 40–150)
Anion Gap: 9 mEq/L (ref 3–11)
BUN: 16.8 mg/dL (ref 7.0–26.0)
CALCIUM: 9.9 mg/dL (ref 8.4–10.4)
CO2: 37 mEq/L — ABNORMAL HIGH (ref 22–29)
Chloride: 92 mEq/L — ABNORMAL LOW (ref 98–109)
Creatinine: 1 mg/dL (ref 0.6–1.1)
EGFR: 60 mL/min/{1.73_m2} — ABNORMAL LOW (ref >90)
Glucose: 119 mg/dl (ref 70–140)
POTASSIUM: 3.6 meq/L (ref 3.5–5.1)
Sodium: 138 mEq/L (ref 136–145)
Total Bilirubin: 0.31 mg/dL (ref 0.20–1.20)
Total Protein: 6 g/dL — ABNORMAL LOW (ref 6.4–8.3)

## 2014-10-14 LAB — CBC WITH DIFFERENTIAL/PLATELET
BASO%: 0.5 % (ref 0.0–2.0)
BASOS ABS: 0.1 10*3/uL (ref 0.0–0.1)
EOS ABS: 0 10*3/uL (ref 0.0–0.5)
EOS%: 0.3 % (ref 0.0–7.0)
HCT: 40.3 % (ref 34.8–46.6)
HEMOGLOBIN: 12.9 g/dL (ref 11.6–15.9)
LYMPH%: 7.1 % — ABNORMAL LOW (ref 14.0–49.7)
MCH: 28.4 pg (ref 25.1–34.0)
MCHC: 32.1 g/dL (ref 31.5–36.0)
MCV: 88.7 fL (ref 79.5–101.0)
MONO#: 0.5 10*3/uL (ref 0.1–0.9)
MONO%: 5.3 % (ref 0.0–14.0)
NEUT%: 86.8 % — AB (ref 38.4–76.8)
NEUTROS ABS: 8.4 10*3/uL — AB (ref 1.5–6.5)
Platelets: 416 10*3/uL — ABNORMAL HIGH (ref 145–400)
RBC: 4.54 10*6/uL (ref 3.70–5.45)
RDW: 15.4 % — AB (ref 11.2–14.5)
WBC: 9.7 10*3/uL (ref 3.9–10.3)
lymph#: 0.7 10*3/uL — ABNORMAL LOW (ref 0.9–3.3)

## 2014-10-15 ENCOUNTER — Telehealth: Payer: Self-pay | Admitting: *Deleted

## 2014-10-15 LAB — CA 125: CA 125: 4121 U/mL — AB (ref <35)

## 2014-10-15 NOTE — Telephone Encounter (Signed)
-----   Message from Ladell Pier, MD sent at 10/15/2014  7:37 AM EDT ----- Please call patient, ca125 is elevated consistent with a gyn malignancy

## 2014-10-15 NOTE — Telephone Encounter (Signed)
Informed pt CA 125 is elevated, consistent with GYN malignancy. She reports this is what she expected.

## 2014-10-19 ENCOUNTER — Encounter: Payer: Self-pay | Admitting: Gynecologic Oncology

## 2014-10-19 ENCOUNTER — Ambulatory Visit: Payer: Medicare Other | Attending: Gynecologic Oncology | Admitting: Gynecologic Oncology

## 2014-10-19 VITALS — BP 141/66 | HR 93 | Temp 97.9°F | Resp 24 | Ht 60.5 in | Wt 179.1 lb

## 2014-10-19 DIAGNOSIS — Z9981 Dependence on supplemental oxygen: Secondary | ICD-10-CM

## 2014-10-19 DIAGNOSIS — C782 Secondary malignant neoplasm of pleura: Secondary | ICD-10-CM | POA: Diagnosis not present

## 2014-10-19 DIAGNOSIS — C569 Malignant neoplasm of unspecified ovary: Secondary | ICD-10-CM | POA: Insufficient documentation

## 2014-10-19 DIAGNOSIS — C786 Secondary malignant neoplasm of retroperitoneum and peritoneum: Secondary | ICD-10-CM | POA: Diagnosis not present

## 2014-10-19 DIAGNOSIS — J449 Chronic obstructive pulmonary disease, unspecified: Secondary | ICD-10-CM | POA: Diagnosis not present

## 2014-10-19 DIAGNOSIS — C801 Malignant (primary) neoplasm, unspecified: Secondary | ICD-10-CM

## 2014-10-19 NOTE — Patient Instructions (Signed)
Scheduling will call you with appointment for the biopsy - this will be at [facility] in Interventional Radiology.  Dr. Denman George will plan to see you after you complete 3 cycles of chemo.

## 2014-10-19 NOTE — Progress Notes (Signed)
Consult Note: Gyn-Onc  Consult was requested by Dr. Benay Spice for the evaluation of Lynn Ramirez 74 y.o. 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  CC:  Chief Complaint  Patient presents with  . Ovarian Cancer    Assessment/Plan:  Lynn Ramirez  is a 74 y.o.  year old with presumed stage IV primary peritoneal carcinoma.  I performed a history, physical examination, and personally reviewed the patient's imaging films including CT abdomen and pelvis from 10/06/14..  I agree with Dr. Gearldine Shown plan for neoadjuvant chemotherapy with paclitaxel and carboplatin followed by assessment after her third cycle with CT imaging and physical examination (we will schedule her appointment with me after her third cycle of chemotherapy) and interval debulking to take place at that time if she has a good performance status and has had a good response to neoadjuvant chemotherapy. Surgery would be followed by additional adjuvant chemotherapy.  Given the slightly and usual presentation of perineal carcinomatosis in the absence of pelvic masses I'm recommending we obtain a core biopsy of the omentum to provide histologic analysis to reduce the likelihood of a inaccurate diagnosis and appropriate chemotherapy choices. This however should not delay her starting chemotherapy.  We discussed the etiology of ovarian cancer, the importance of genetic consultation and genetic testing as 25% of patients with this diagnosis carry a deleterious mutation which predisposes for this disease, and typical prognosis. I discussed that 80% of patients will have good response to this initial therapy and will enter remission. However I discussed that the recurrence rates are high in 5 year overall survival is approximately 35%.  HPI: Lynn Ramirez is a very pleasant 74 year old woman (G3 P3) who is seen in consultation at the request of Dr. 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  The  patient reports an episode of dyspnea requiring a chest x-ray in March 2016. She underwent chest x-ray which revealed a large right pleural effusion. An ultrasound guided thoracentesis on 09/30/2014 extracted 1.6 L of bloody fluid and cytology revealed carcinoma consistent on immunostains with gynecologic primary.  The CT scan showed a large omental cake, but normal ovaries and uterus. There was moderate ascites throughout the abdomen. He was a large right pleural effusion. There was some thickening of the small bowel mesentery and bowel wall.  She underwent CA-125 evaluation on 10/14/2014 and this was elevated at 4121 units per milliliter.  Of note she had a colonoscopy in [place] 2016 but there was no ability to visualize beyond the sigmoid colon secondary to sigmoid colon spasming. This had happened 10 years previously. Therefore the patient has never had a colonoscopy take evaluation of her colon proximal to the sigmoid.  She has a history of a left nephrectomy for congenital abnormality.  Interval History: Patient continues to have shortness of breath requiring oxygen. This is a new oxygen requirement since her diagnosis in March. She does have an underlying history of emphysema and COPD however is not oxygen dependent typically. She has early satiety and poor appetite. She has abdominal bloating. He denies vaginal bleeding.  Current Meds:  Outpatient Encounter Prescriptions as of 10/19/2014  Medication Sig  . acetaminophen (TYLENOL) 325 MG tablet Take 650 mg by mouth as needed.  Marland Kitchen antiseptic oral rinse (BIOTENE) LIQD 15 mLs by Mouth Rinse route 3 (three) times daily.  Marland Kitchen BIOTIN PO Take by mouth daily.  . calcium carbonate (OS-CAL) 600 MG TABS Take 600 mg by mouth daily with breakfast.   . cholecalciferol (VITAMIN  D) 1000 UNITS tablet Take 1,000 Units by mouth daily.  . fish oil-omega-3 fatty acids 1000 MG capsule Take 1 g by mouth daily.   Marland Kitchen loratadine (CLARITIN) 10 MG tablet Take 10 mg by  mouth daily.  . SYMBICORT 160-4.5 MCG/ACT inhaler INHALE 2 PUFFS BY MOUTH TWICE DAILY  . Tiotropium Bromide Monohydrate (SPIRIVA RESPIMAT) 2.5 MCG/ACT AERS Inhale 2 puffs into the lungs daily.  Marland Kitchen triamterene-hydrochlorothiazide (MAXZIDE-25) 37.5-25 MG per tablet Take 1 tablet by mouth daily.   . Multiple Vitamins-Minerals (MULTIVITAMIN WITH MINERALS) tablet Take 1 tablet by mouth daily.  Marland Kitchen PROAIR HFA 108 (90 BASE) MCG/ACT inhaler INHALE 2 PUFFS BY MOUTH FOUR TIMES DAILY AS NEEDED FOR WHEEZING (Patient not taking: Reported on 10/19/2014)  . zaleplon (SONATA) 5 MG capsule Take 1 capsule (5 mg total) by mouth at bedtime as needed for sleep. (Patient not taking: Reported on 10/19/2014)    Allergy:  Allergies  Allergen Reactions  . Penicillins     Social Hx:   History   Social History  . Marital Status: Married    Spouse Name: N/A  . Number of Children: 3  . Years of Education: N/A   Occupational History  . RETIRED    Social History Main Topics  . Smoking status: Former Smoker -- 1.00 packs/day for 40 years    Types: Cigarettes    Quit date: 07/10/2013  . Smokeless tobacco: Never Used     Comment: Pt states that she is has struggled with smoking since Jan--1 cig here and there 04/09/14  . Alcohol Use: Yes     Comment: social   . Drug Use: No  . Sexual Activity: Not on file   Other Topics Concern  . Not on file   Social History Narrative    Past Surgical Hx:  Past Surgical History  Procedure Laterality Date  . Nephrectomy    . Tonsilectomy, adenoidectomy, bilateral myringotomy and tubes    . Tubal ligation    . Hand surgery Right 54's    Past Medical Hx:  Past Medical History  Diagnosis Date  . H/O hydronephrosis   . Hypertension   . Emphysema   . COPD (chronic obstructive pulmonary disease)     Past Gynecological History:  G3P3, tubal ligation.  No LMP recorded. Patient is postmenopausal.  Family Hx:  Family History  Problem Relation Age of Onset  . Liver  cancer Father   . Diabetes Father   . Liver cancer Mother   . Diabetes Paternal Grandfather   . Colon cancer Neg Hx   . Stomach cancer Neg Hx     Review of Systems:  Constitutional  Feels fatigued  ENT Normal appearing ears and nares bilaterally Skin/Breast  No rash, sores, jaundice, itching, dryness Cardiovascular  No chest pain, shortness of breath, or edema  Pulmonary  No cough or wheeze.  Gastro Intestinal  No nausea, vomitting, or diarrhoea. No bright red blood per rectum, no abdominal pain, change in bowel movement, or constipation. See HPI Genito Urinary  No frequency, urgency, dysuria, see HPI Musculo Skeletal  No myalgia, arthralgia, joint swelling or pain  Neurologic  No weakness, numbness, change in gait,  Psychology  No depression, anxiety, insomnia.   Vitals:  Blood pressure 141/66, pulse 93, temperature 97.9 F (36.6 C), temperature source Oral, resp. rate 24, height 5' 0.5" (1.537 m), weight 179 lb 1.6 oz (81.239 kg), SpO2 100 %.  Physical Exam: WD in NAD Neck  Supple NROM, without any enlargements.  Lymph Node Survey No cervical supraclavicular or inguinal adenopathy Cardiovascular  Pulse normal rate, regularity and rhythm. S1 and S2 normal.  Lungs  Clear to auscultation bilateraly, without wheezes/crackles/rhonchi. Good air movement.  Skin  No rash/lesions/breakdown  Psychiatry  Alert and oriented to person, place, and time  Abdomen  Normoactive bowel sounds, abdomen soft, non-tender and overweight, distended with ascites and dull to percuss, without evidence of hernia.  Back No CVA tenderness Genito Urinary  Vulva/vagina: Normal external female genitalia.  No lesions. No discharge or bleeding.  Bladder/urethra:  No lesions or masses, well supported bladder  Vagina: normal without lesiosn  Cervix: Normal appearing, no lesions.  Uterus: Small, mobile, no parametrial involvement or nodularity.  Adnexa: no palpable masses. Rectal  Good tone,  no masses no cul de sac nodularity.  Extremities  No bilateral cyanosis, clubbing or edema.   Donaciano Eva, MD   10/19/2014, 5:04 PM

## 2014-10-20 ENCOUNTER — Telehealth: Payer: Self-pay | Admitting: *Deleted

## 2014-10-20 ENCOUNTER — Other Ambulatory Visit: Payer: Self-pay | Admitting: *Deleted

## 2014-10-20 ENCOUNTER — Encounter: Payer: Self-pay | Admitting: Pulmonary Disease

## 2014-10-20 ENCOUNTER — Ambulatory Visit (INDEPENDENT_AMBULATORY_CARE_PROVIDER_SITE_OTHER): Payer: Medicare Other | Admitting: Pulmonary Disease

## 2014-10-20 ENCOUNTER — Telehealth: Payer: Self-pay | Admitting: Pulmonary Disease

## 2014-10-20 VITALS — BP 130/78 | HR 95 | Temp 97.6°F | Ht 60.0 in | Wt 180.8 lb

## 2014-10-20 DIAGNOSIS — J90 Pleural effusion, not elsewhere classified: Secondary | ICD-10-CM | POA: Diagnosis not present

## 2014-10-20 DIAGNOSIS — J438 Other emphysema: Secondary | ICD-10-CM

## 2014-10-20 NOTE — Telephone Encounter (Signed)
Called patient back this morning to let her know per Dr. Denman George, Dr. Benay Ramirez will be the provider ordering any thoracentesises needed in the future since he is the patient's oncologist. Told patient that information regarding this has been given to Dr. Benay Ramirez to review this morning. Patient states she woke up this morning with increased shortness of breath and got an appointment with Dr. Gwenette Ramirez, her pulmonologist who has ordered her previous thoracentesis, this afternoon.  Patient also inquired about when [facility] will be placed - she states she doesn't want to wait long because she is eager to get chemo started. Information passed along to Amy, RN with Dr. Benay Ramirez today. Patient called back and notified that this was done.

## 2014-10-20 NOTE — Assessment & Plan Note (Addendum)
The patient has been diagnosed with papillary adenocarcinoma that is probably from ovarian cancer. He is been having issues with ascites, and also transdiaphragmatic progression into her right pleural space. She has had a recent thoracentesis and paracentesis, but now is having progressive shortness of breath that is becoming more significant. She has dullness on exam at least half the way up, and we'll therefore refer her for a repeat tap. She will be receiving chemotherapy next week, and hopefully this will resolve her fluid issue.  We must also be vigilant that her shortness of breath is not related to thromboembolic disease from her underlying cancer. She is not having any pleuritic chest pain or significant lower extremity edema.

## 2014-10-20 NOTE — Progress Notes (Signed)
   Subjective:    Patient ID: Lynn Ramirez, female    DOB: 11-16-1940, 74 y.o.   MRN: [id number]  HPI The patient comes in today for worsening shortness of breath. She has known COPD, but has not had any chest congestion, productive cough, or wheezing. She also has a known recurring right pleural effusion secondary thoracic ascites, with papillary adenocarcinoma on cytology. This is felt to represent ovarian cancer, and she is plan for chemotherapy possibly next week. He has our he had a thoracentesis and paracentesis, but now she has a 3 day history of progressive shortness of breath. She denies any pleuritic chest pain or hemoptysis.   Review of Systems  Constitutional: Negative for fever and unexpected weight change.  HENT: Negative for congestion, dental problem, ear pain, nosebleeds, postnasal drip, rhinorrhea, sinus pressure, sneezing, sore throat and trouble swallowing.   Eyes: Negative for redness and itching.  Respiratory: Positive for cough and shortness of breath. Negative for chest tightness and wheezing.   Cardiovascular: Negative for palpitations and leg swelling.  Gastrointestinal: Negative for nausea and vomiting.  Genitourinary: Negative for dysuria.  Musculoskeletal: Negative for joint swelling.  Skin: Negative for rash.  Neurological: Negative for headaches.  Hematological: Does not bruise/bleed easily.  Psychiatric/Behavioral: Negative for dysphoric mood. The patient is not nervous/anxious.        Objective:   Physical Exam Well-developed female in no acute distress Nose without purulence or discharge noted Neck without lymphadenopathy or thyromegaly Chest with decreased breath sounds in the right base, and dullness to percussion half the way up. Cardiac exam with regular rate and rhythm Lower extremities with minimal edema, no cyanosis Alert and oriented, moves all 4 extremities.       Assessment & Plan:

## 2014-10-20 NOTE — Telephone Encounter (Signed)
Pt has been scheduled with [facility] today at 1:30pm. Advised pt that if her breathing worsens between now and then she needs to call 911. She verbalized understanding.

## 2014-10-20 NOTE — Patient Instructions (Signed)
Will arrange for repeat thoracentesis. Let us know if your breathing does not improve.

## 2014-10-21 ENCOUNTER — Ambulatory Visit (HOSPITAL_COMMUNITY)
Admission: RE | Admit: 2014-10-21 | Discharge: 2014-10-21 | Disposition: A | Discharge status: Home / Self Care | Payer: Medicare Other | Source: Ambulatory Visit | Attending: Pulmonary Disease | Admitting: Pulmonary Disease

## 2014-10-21 ENCOUNTER — Telehealth: Payer: Self-pay | Admitting: *Deleted

## 2014-10-21 ENCOUNTER — Other Ambulatory Visit: Payer: Self-pay | Admitting: *Deleted

## 2014-10-21 ENCOUNTER — Ambulatory Visit (HOSPITAL_COMMUNITY)
Admission: RE | Admit: 2014-10-21 | Discharge: 2014-10-21 | Disposition: A | Discharge status: Home / Self Care | Payer: Medicare Other | Source: Ambulatory Visit | Attending: Radiology | Admitting: Radiology

## 2014-10-21 DIAGNOSIS — J90 Pleural effusion, not elsewhere classified: Secondary | ICD-10-CM | POA: Diagnosis not present

## 2014-10-21 DIAGNOSIS — Z9889 Other specified postprocedural states: Secondary | ICD-10-CM | POA: Diagnosis not present

## 2014-10-21 DIAGNOSIS — C799 Secondary malignant neoplasm of unspecified site: Secondary | ICD-10-CM

## 2014-10-21 NOTE — Telephone Encounter (Signed)
Spoke with pt this am and she report "I'm hanging in there until 1pm when I get my thoracentesis today"  Notified pt that chemo scheduler will be in touch with date/time of first chemo.  Per Dr. Benson Norway to have 1st tx peripherally until port can be scheduled.  Pt verbalized understanding and "anxious to get started with everything to help my breathing"  Let pt know order for port a cath has been placed and noted request from pt if they can do CT Biopsy and port placement same day on 4/25.  Pt verbalized understanding of information.

## 2014-10-21 NOTE — Telephone Encounter (Signed)
Per staff message and POF I have scheduled appts. Advised scheduler of appts. JMW  

## 2014-10-21 NOTE — Procedures (Signed)
Successful US guided right thoracentesis. Yielded 750 ml of serous fluid. Pt tolerated procedure well. No immediate complications.  Specimen was not sent for labs. CXR ordered.  Tsosie Billing D PA-C 10/21/2014 1:24 PM

## 2014-10-23 ENCOUNTER — Other Ambulatory Visit (HOSPITAL_COMMUNITY): Payer: Self-pay | Admitting: Obstetrics and Gynecology

## 2014-10-23 ENCOUNTER — Telehealth: Payer: Self-pay | Admitting: *Deleted

## 2014-10-23 ENCOUNTER — Ambulatory Visit (HOSPITAL_COMMUNITY)
Admission: RE | Admit: 2014-10-23 | Discharge: 2014-10-23 | Disposition: A | Discharge status: Home / Self Care | Payer: Medicare Other | Source: Ambulatory Visit | Attending: Oncology | Admitting: Oncology

## 2014-10-23 ENCOUNTER — Ambulatory Visit (HOSPITAL_BASED_OUTPATIENT_CLINIC_OR_DEPARTMENT_OTHER): Payer: Medicare Other | Admitting: Oncology

## 2014-10-23 ENCOUNTER — Other Ambulatory Visit: Payer: Medicare Other

## 2014-10-23 ENCOUNTER — Telehealth: Payer: Self-pay | Admitting: Oncology

## 2014-10-23 VITALS — BP 152/65 | HR 106 | Temp 98.2°F | Resp 19 | Ht 60.0 in | Wt 182.8 lb

## 2014-10-23 DIAGNOSIS — R188 Other ascites: Secondary | ICD-10-CM | POA: Insufficient documentation

## 2014-10-23 DIAGNOSIS — C799 Secondary malignant neoplasm of unspecified site: Secondary | ICD-10-CM

## 2014-10-23 DIAGNOSIS — J910 Malignant pleural effusion: Secondary | ICD-10-CM | POA: Diagnosis not present

## 2014-10-23 DIAGNOSIS — C801 Malignant (primary) neoplasm, unspecified: Secondary | ICD-10-CM

## 2014-10-23 DIAGNOSIS — C569 Malignant neoplasm of unspecified ovary: Secondary | ICD-10-CM

## 2014-10-23 DIAGNOSIS — C482 Malignant neoplasm of peritoneum, unspecified: Secondary | ICD-10-CM

## 2014-10-23 DIAGNOSIS — C786 Secondary malignant neoplasm of retroperitoneum and peritoneum: Secondary | ICD-10-CM

## 2014-10-23 MED ORDER — LIDOCAINE HCL (PF) 1 % IJ SOLN
INTRAMUSCULAR | Status: AC
  Filled 2014-10-23: qty 10

## 2014-10-23 NOTE — Telephone Encounter (Signed)
Confirm appointment for 04/20. Was in scheduling.

## 2014-10-23 NOTE — Telephone Encounter (Signed)
Per staff message and POF I have scheduled appts. Advised scheduler of appts. JMW  

## 2014-10-23 NOTE — Progress Notes (Signed)
No availability at [facility] for paracentesis. Pt worked in at [facility] for 12PM. Will make pt aware.

## 2014-10-23 NOTE — Progress Notes (Signed)
Lynn Ramirez   Diagnosis: Primary peritoneal carcinoma  INTERVAL HISTORY:   Lynn Ramirez returns as scheduled. She saw Lynn Ramirez. Lynn Ramirez is in agreement with a diagnosis of primary peritoneal carcinoma versus ovarian cancer. She is scheduled for an omental biopsy 11/02/2014. She developed increased dyspnea and underwent another thoracentesis 10/21/2014.  Lynn Ramirez complains of discomfort related to abdominal distention.   Objective:  Vital signs in last 24 hours:  Blood pressure 152/65, pulse 106, temperature 98.2 F (36.8 C), temperature source Oral, resp. rate 19, height 5' (1.524 m), weight 182 lb 12.8 oz (82.918 kg), SpO2 97 %.   Resp: Decreased breath sounds at the right lower chest, distant breath sounds throughout Cardio: Regular rate and rhythm GI: Markedly distended Vascular: Trace to 1+ pitting edema at the low leg bilaterally   Lab Results:  Lab Results  Component Value Date   WBC 9.7 10/14/2014   HGB 12.9 10/14/2014   HCT 40.3 10/14/2014   MCV 88.7 10/14/2014   PLT 416* 10/14/2014   NEUTROABS 8.4* 10/14/2014    CA 125 on 10/14/2014: 4121   Imaging:  Dg Chest 1 View  10/21/2014   CLINICAL DATA:  Status post right thoracentesis  EXAM: CHEST  1 VIEW  COMPARISON:  10/09/2014  FINDINGS: No pneumothorax is seen status post right thoracentesis.  Small bilateral pleural effusions. Associated bilateral lower lobe opacities, likely atelectasis.  The heart is normal in size.  IMPRESSION: No pneumothorax is seen status post right thoracentesis.  Small bilateral pleural effusions.  Associated bilateral lower lobe opacities, likely atelectasis.   Electronically Signed   By: Lynn Ramirez M.D.   On: 10/21/2014 13:41   US Paracentesis  10/23/2014   CLINICAL DATA:  74 year old female with a history of ovarian malignancy. She has been referred for therapeutic paracentesis.  EXAM: ULTRASOUND GUIDED  PARACENTESIS  COMPARISON:   None.  PROCEDURE: An ultrasound guided paracentesis was thoroughly discussed with the patient and questions answered. The benefits, risks, alternatives and complications were also discussed. The patient understands and wishes to proceed with the procedure. Written consent was obtained.  Ultrasound was performed to localize and mark an adequate pocket of fluid in the right lower quadrant of the abdomen. The area was then prepped and draped in the normal sterile fashion. 1% Lidocaine was used for local anesthesia. Under ultrasound guidance Safe-T-Centesis catheter was introduced. Paracentesis was performed. The catheter was removed and a dressing applied.  COMPLICATIONS: None  FINDINGS: A total of approximately 2.5 of yellow clear fluid was removed.  IMPRESSION: Status post US guided right lower quadrant paracentesis.  Signed,  Lynn Fanny. Earleen Newport, Lynn Ramirez  [facility]  [facility]   Electronically Signed   By: Lynn Mckusick D.O.   On: 10/23/2014 15:08   US Thoracentesis Asp Pleural Space W/img Guide  10/21/2014   INDICATION: Symptomatic bilateral pleural effusions, right greater than left.  EXAM: US THORACENTESIS ASP PLEURAL SPACE W/IMG GUIDE  COMPARISON:  CXR 10/09/14.  MEDICATIONS: None  COMPLICATIONS: None immediate  TECHNIQUE: Informed written consent was obtained from the patient after a discussion of the risks, benefits and alternatives to treatment. A timeout was performed prior to the initiation of the procedure.  Initial ultrasound scanning demonstrates a right pleural effusion. The lower chest was prepped and draped in the usual sterile fashion. 1% lidocaine was used for local anesthesia.  Under direct ultrasound guidance, a 19 gauge, 7-cm, Yueh catheter was introduced. An ultrasound image  was saved for documentation purposes. The thoracentesis was performed. The catheter was removed and a dressing was applied. The patient tolerated the procedure well without immediate  post procedural complication. The patient was escorted to have an upright chest radiograph.  FINDINGS: A total of approximately 750 ml of serous fluid was removed.  IMPRESSION: Successful ultrasound-guided right sided thoracentesis yielding 750 ml of pleural fluid.  Read By:  Lynn Ramirez   Electronically Signed   By: Lynn Edouard M.D.   On: 10/21/2014 13:46    Medications: I have reviewed the patient's current medications.  Assessment/Plan: 1. 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, immunohistochemical profile consistent with a GYN primary, elevated CA 125  Staging CTs of the chest, abdomen, and pelvis on 10/06/2014 revealed a loculated right pleural effusion, ascites, and omental/mesenteric thickening  Cytology from peritoneal fluid 10/13/2014 revealed malignant cells consistent with metastatic adenocarcinoma 2. COPD 3. Dyspnea secondary to COPD and the large right pleural effusion, status post therapeutic thoracentesis procedures 09/30/2014,10/09/2014, and 10/21/2014 4. Left nephrectomy as a child   Disposition:  Lynn Ramirez has been diagnosed with advanced stage ovarian cancer versus primary peritoneal carcinoma. I recommended proceeding with Taxol/carboplatin chemotherapy. We reviewed the potential toxicities associated with this regimen including the chance for nausea/vomiting, mucositis, alopecia, and hematologic toxicity. We discussed the allergic reaction, bone pain, and neuropathy associated with Taxol. We discussed the chance of allergic reaction with carboplatin. She will attend a chemotherapy teaching class today. She agrees to proceed.  The plan is to complete a first cycle of chemotherapy with peripheral IV access. She will be referred for placement of a Port-A-Cath.  The plan is to complete 3 cycles of chemotherapy prior to a restaging evaluation and  debulking surgery.  Lynn Coder, MD  10/23/2014    5:11 PM

## 2014-10-23 NOTE — Telephone Encounter (Signed)
TC from patient. She saw Dr. Benay Spice today @ 9am. She was told there would be some prescriptions called in to her pharmacy ([facility] on [address]). Patient has been

## 2014-10-25 ENCOUNTER — Other Ambulatory Visit: Payer: Self-pay | Admitting: Oncology

## 2014-10-26 ENCOUNTER — Other Ambulatory Visit: Payer: Self-pay | Admitting: *Deleted

## 2014-10-26 MED ORDER — PROCHLORPERAZINE MALEATE 5 MG PO TABS: 5.0000 mg | ORAL_TABLET | Freq: Four times a day (QID) | ORAL | Status: DC | PRN

## 2014-10-26 MED ORDER — DEXAMETHASONE 2 MG PO TABS: 10.0000 mg | ORAL_TABLET | Freq: Two times a day (BID) | ORAL | Status: DC

## 2014-10-26 NOTE — Telephone Encounter (Signed)
Pt left 2 messages to follow up on Decadron premed  Rx. Returned call to inform her Rx has been sent to pharmacy. Instructions reviewed. She voiced understanding. Informed her antiemetic was sent to pharmacy as well.

## 2014-10-28 ENCOUNTER — Ambulatory Visit (HOSPITAL_BASED_OUTPATIENT_CLINIC_OR_DEPARTMENT_OTHER): Payer: Medicare Other

## 2014-10-28 VITALS — BP 140/67 | HR 76 | Temp 98.1°F | Resp 18

## 2014-10-28 DIAGNOSIS — Z5111 Encounter for antineoplastic chemotherapy: Secondary | ICD-10-CM

## 2014-10-28 DIAGNOSIS — C786 Secondary malignant neoplasm of retroperitoneum and peritoneum: Secondary | ICD-10-CM | POA: Diagnosis not present

## 2014-10-28 DIAGNOSIS — C801 Malignant (primary) neoplasm, unspecified: Secondary | ICD-10-CM | POA: Diagnosis not present

## 2014-10-28 DIAGNOSIS — C799 Secondary malignant neoplasm of unspecified site: Secondary | ICD-10-CM

## 2014-10-28 MED ORDER — SODIUM CHLORIDE 0.9 % IV SOLN
Freq: Once | INTRAVENOUS | Status: AC
  Administered 2014-10-28: 11:00:00 via INTRAVENOUS

## 2014-10-28 MED ORDER — SODIUM CHLORIDE 0.9 % IV SOLN
Freq: Once | INTRAVENOUS | Status: AC
  Administered 2014-10-28: 11:00:00 via INTRAVENOUS
  Filled 2014-10-28: qty 8

## 2014-10-28 MED ORDER — PACLITAXEL CHEMO INJECTION 300 MG/50ML
175.0000 mg/m2 | Freq: Once | INTRAVENOUS | Status: AC
  Administered 2014-10-28: 330 mg via INTRAVENOUS
  Filled 2014-10-28: qty 55

## 2014-10-28 MED ORDER — SODIUM CHLORIDE 0.9 % IV SOLN
462.5000 mg | Freq: Once | INTRAVENOUS | Status: AC
  Administered 2014-10-28: 460 mg via INTRAVENOUS
  Filled 2014-10-28: qty 46

## 2014-10-28 MED ORDER — DIPHENHYDRAMINE HCL 50 MG/ML IJ SOLN
50.0000 mg | Freq: Once | INTRAMUSCULAR | Status: AC
  Administered 2014-10-28: 50 mg via INTRAVENOUS

## 2014-10-28 MED ORDER — DIPHENHYDRAMINE HCL 50 MG/ML IJ SOLN
INTRAMUSCULAR | Status: AC
  Filled 2014-10-28: qty 1

## 2014-10-28 MED ORDER — FAMOTIDINE IN NACL 20-0.9 MG/50ML-% IV SOLN
20.0000 mg | Freq: Once | INTRAVENOUS | Status: AC
  Administered 2014-10-28: 20 mg via INTRAVENOUS

## 2014-10-28 MED ORDER — FAMOTIDINE IN NACL 20-0.9 MG/50ML-% IV SOLN
INTRAVENOUS | Status: AC
  Filled 2014-10-28: qty 50

## 2014-10-28 MED ORDER — LIDOCAINE-PRILOCAINE 2.5-2.5 % EX CREA: TOPICAL_CREAM | CUTANEOUS | Status: DC

## 2014-10-28 NOTE — Patient Instructions (Signed)
[facility] Discharge Instructions for Patients Receiving Chemotherapy  Today you received the following chemotherapy agents Taxol/Carboplatin  To help prevent nausea and vomiting after your treatment, we encourage you to take your nausea medication     If you develop nausea and vomiting that is not controlled by your nausea medication, call the clinic.   BELOW ARE SYMPTOMS THAT SHOULD BE REPORTED IMMEDIATELY:  *FEVER GREATER THAN 100.5 F  *CHILLS WITH OR WITHOUT FEVER  NAUSEA AND VOMITING THAT IS NOT CONTROLLED WITH YOUR NAUSEA MEDICATION  *UNUSUAL SHORTNESS OF BREATH  *UNUSUAL BRUISING OR BLEEDING  TENDERNESS IN MOUTH AND THROAT WITH OR WITHOUT PRESENCE OF ULCERS  *URINARY PROBLEMS  *BOWEL PROBLEMS  UNUSUAL RASH Items with * indicate a potential emergency and should be followed up as soon as possible.  Feel free to call the clinic you have any questions or concerns. The clinic phone number is [phone].  Please show the [facility] at check-in to the Emergency Department and triage nurse.  Carboplatin injection What is this medicine? CARBOPLATIN (KAR boe pla tin) is a chemotherapy drug. It targets fast dividing cells, like cancer cells, and causes these cells to die. This medicine is used to treat ovarian cancer and many other cancers. This medicine may be used for other purposes; ask your health care provider or pharmacist if you have questions. COMMON BRAND NAME(S): Paraplatin What should I tell my health care provider before I take this medicine? They need to know if you have any of these conditions: -blood disorders -hearing problems -kidney disease -recent or ongoing radiation therapy -an unusual or allergic reaction to carboplatin, cisplatin, other chemotherapy, other medicines, foods, dyes, or preservatives -pregnant or trying to get pregnant -breast-feeding How should I use this medicine? This drug is usually given as an infusion into  a vein. It is administered in a hospital or clinic by a specially trained health care professional. Talk to your pediatrician regarding the use of this medicine in children. Special care may be needed. Overdosage: If you think you have taken too much of this medicine contact a poison control center or emergency room at once. NOTE: This medicine is only for you. Do not share this medicine with others. What if I miss a dose? It is important not to miss a dose. Call your doctor or health care professional if you are unable to keep an appointment. What may interact with this medicine? -medicines for seizures -medicines to increase blood counts like filgrastim, pegfilgrastim, sargramostim -some antibiotics like amikacin, gentamicin, neomycin, streptomycin, tobramycin -vaccines Talk to your doctor or health care professional before taking any of these medicines: -acetaminophen -aspirin -ibuprofen -ketoprofen -naproxen This list may not describe all possible interactions. Give your health care provider a list of all the medicines, herbs, non-prescription drugs, or dietary supplements you use. Also tell them if you smoke, drink alcohol, or use illegal drugs. Some items may interact with your medicine. What should I watch for while using this medicine? Your condition will be monitored carefully while you are receiving this medicine. You will need important blood work done while you are taking this medicine. This drug may make you feel generally unwell. This is not uncommon, as chemotherapy can affect healthy cells as well as cancer cells. Report any side effects. Continue your course of treatment even though you feel ill unless your doctor tells you to stop. In some cases, you may be given additional medicines to help with side effects. Follow all directions  for their use. Call your doctor or health care professional for advice if you get a fever, chills or sore throat, or other symptoms of a cold or  flu. Do not treat yourself. This drug decreases your body's ability to fight infections. Try to avoid being around people who are sick. This medicine may increase your risk to bruise or bleed. Call your doctor or health care professional if you notice any unusual bleeding. Be careful brushing and flossing your teeth or using a toothpick because you may get an infection or bleed more easily. If you have any dental work done, tell your dentist you are receiving this medicine. Avoid taking products that contain aspirin, acetaminophen, ibuprofen, naproxen, or ketoprofen unless instructed by your doctor. These medicines may hide a fever. Do not become pregnant while taking this medicine. Women should inform their doctor if they wish to become pregnant or think they might be pregnant. There is a potential for serious side effects to an unborn child. Talk to your health care professional or pharmacist for more information. Do not breast-feed an infant while taking this medicine. What side effects may I notice from receiving this medicine? Side effects that you should report to your doctor or health care professional as soon as possible: -allergic reactions like skin rash, itching or hives, swelling of the face, lips, or tongue -signs of infection - fever or chills, cough, sore throat, pain or difficulty passing urine -signs of decreased platelets or bleeding - bruising, pinpoint red spots on the skin, black, tarry stools, nosebleeds -signs of decreased red blood cells - unusually weak or tired, fainting spells, lightheadedness -breathing problems -changes in hearing -changes in vision -chest pain -high blood pressure -low blood counts - This drug may decrease the number of white blood cells, red blood cells and platelets. You may be at increased risk for infections and bleeding. -nausea and vomiting -pain, swelling, redness or irritation at the injection site -pain, tingling, numbness in the hands or  feet -problems with balance, talking, walking -trouble passing urine or change in the amount of urine Side effects that usually do not require medical attention (report to your doctor or health care professional if they continue or are bothersome): -hair loss -loss of appetite -metallic taste in the mouth or changes in taste This list may not describe all possible side effects. Call your doctor for medical advice about side effects. You may report side effects to FDA at 1-800-FDA-1088. Where should I keep my medicine? This drug is given in a hospital or clinic and will not be stored at home. NOTE: This sheet is a summary. It may not cover all possible information. If you have questions about this medicine, talk to your doctor, pharmacist, or health care provider.  2015, Elsevier/Gold Standard. (2007-10-01 14:38:05)  Paclitaxel injection What is this medicine? PACLITAXEL (PAK li TAX el) is a chemotherapy drug. It targets fast dividing cells, like cancer cells, and causes these cells to die. This medicine is used to treat ovarian cancer, breast cancer, and other cancers. This medicine may be used for other purposes; ask your health care provider or pharmacist if you have questions. COMMON BRAND NAME(S): Onxol, Taxol What should I tell my health care provider before I take this medicine? They need to know if you have any of these conditions: -blood disorders -irregular heartbeat -infection (especially a virus infection such as chickenpox, cold sores, or herpes) -liver disease -previous or ongoing radiation therapy -an unusual or allergic reaction to paclitaxel, alcohol,  polyoxyethylated castor oil, other chemotherapy agents, other medicines, foods, dyes, or preservatives -pregnant or trying to get pregnant -breast-feeding How should I use this medicine? This drug is given as an infusion into a vein. It is administered in a hospital or clinic by a specially trained health care  professional. Talk to your pediatrician regarding the use of this medicine in children. Special care may be needed. Overdosage: If you think you have taken too much of this medicine contact a poison control center or emergency room at once. NOTE: This medicine is only for you. Do not share this medicine with others. What if I miss a dose? It is important not to miss your dose. Call your doctor or health care professional if you are unable to keep an appointment. What may interact with this medicine? Do not take this medicine with any of the following medications: -disulfiram -metronidazole This medicine may also interact with the following medications: -cyclosporine -diazepam -ketoconazole -medicines to increase blood counts like filgrastim, pegfilgrastim, sargramostim -other chemotherapy drugs like cisplatin, doxorubicin, epirubicin, etoposide, teniposide, vincristine -quinidine -testosterone -vaccines -verapamil Talk to your doctor or health care professional before taking any of these medicines: -acetaminophen -aspirin -ibuprofen -ketoprofen -naproxen This list may not describe all possible interactions. Give your health care provider a list of all the medicines, herbs, non-prescription drugs, or dietary supplements you use. Also tell them if you smoke, drink alcohol, or use illegal drugs. Some items may interact with your medicine. What should I watch for while using this medicine? Your condition will be monitored carefully while you are receiving this medicine. You will need important blood work done while you are taking this medicine. This drug may make you feel generally unwell. This is not uncommon, as chemotherapy can affect healthy cells as well as cancer cells. Report any side effects. Continue your course of treatment even though you feel ill unless your doctor tells you to stop. In some cases, you may be given additional medicines to help with side effects. Follow all  directions for their use. Call your doctor or health care professional for advice if you get a fever, chills or sore throat, or other symptoms of a cold or flu. Do not treat yourself. This drug decreases your body's ability to fight infections. Try to avoid being around people who are sick. This medicine may increase your risk to bruise or bleed. Call your doctor or health care professional if you notice any unusual bleeding. Be careful brushing and flossing your teeth or using a toothpick because you may get an infection or bleed more easily. If you have any dental work done, tell your dentist you are receiving this medicine. Avoid taking products that contain aspirin, acetaminophen, ibuprofen, naproxen, or ketoprofen unless instructed by your doctor. These medicines may hide a fever. Do not become pregnant while taking this medicine. Women should inform their doctor if they wish to become pregnant or think they might be pregnant. There is a potential for serious side effects to an unborn child. Talk to your health care professional or pharmacist for more information. Do not breast-feed an infant while taking this medicine. Men are advised not to father a child while receiving this medicine. What side effects may I notice from receiving this medicine? Side effects that you should report to your doctor or health care professional as soon as possible: -allergic reactions like skin rash, itching or hives, swelling of the face, lips, or tongue -low blood counts - This drug may decrease the  number of white blood cells, red blood cells and platelets. You may be at increased risk for infections and bleeding. -signs of infection - fever or chills, cough, sore throat, pain or difficulty passing urine -signs of decreased platelets or bleeding - bruising, pinpoint red spots on the skin, black, tarry stools, nosebleeds -signs of decreased red blood cells - unusually weak or tired, fainting spells,  lightheadedness -breathing problems -chest pain -high or low blood pressure -mouth sores -nausea and vomiting -pain, swelling, redness or irritation at the injection site -pain, tingling, numbness in the hands or feet -slow or irregular heartbeat -swelling of the ankle, feet, hands Side effects that usually do not require medical attention (report to your doctor or health care professional if they continue or are bothersome): -bone pain -complete hair loss including hair on your head, underarms, pubic hair, eyebrows, and eyelashes -changes in the color of fingernails -diarrhea -loosening of the fingernails -loss of appetite -muscle or joint pain -red flush to skin -sweating This list may not describe all possible side effects. Call your doctor for medical advice about side effects. You may report side effects to FDA at 1-800-FDA-1088. Where should I keep my medicine? This drug is given in a hospital or clinic and will not be stored at home. NOTE: This sheet is a summary. It may not cover all possible information. If you have questions about this medicine, talk to your doctor, pharmacist, or health care provider.  2015, Elsevier/Gold Standard. (2012-08-19 16:41:21)

## 2014-10-28 NOTE — Progress Notes (Signed)
Spoke with pt in infusion room while in for chemo. She reports firmness in R abdomen at paracentesis site seems slightly smaller. Instructed her to continue warm compresses to site. No erythema or pain noted.  Pt requesting EMLA cream. Scheduled for port placement 4/25.

## 2014-10-29 ENCOUNTER — Telehealth: Payer: Self-pay | Admitting: *Deleted

## 2014-10-29 ENCOUNTER — Ambulatory Visit (HOSPITAL_COMMUNITY)
Admission: RE | Admit: 2014-10-29 | Discharge: 2014-10-29 | Disposition: A | Discharge status: Home / Self Care | Payer: Medicare Other | Source: Ambulatory Visit | Attending: Oncology | Admitting: Oncology

## 2014-10-29 NOTE — Telephone Encounter (Signed)
Chemo follow up call complete. Denies any issues. Discussed side effect management. Pt voiced understanding of Compazine instructions. She will call office for nausea that doesn't respond to Compazine. Discussed possible achy pain related to Taxol. OK to use Tylenol for this.  Pt reports she will continue warm compresses over the weekend to R abdomen hematoma.

## 2014-10-30 ENCOUNTER — Ambulatory Visit: Payer: Medicare Other

## 2014-10-30 ENCOUNTER — Ambulatory Visit (HOSPITAL_COMMUNITY)
Admission: RE | Admit: 2014-10-30 | Discharge: 2014-10-30 | Disposition: A | Discharge status: Home / Self Care | Payer: Medicare Other | Source: Ambulatory Visit | Attending: Radiology | Admitting: Radiology

## 2014-10-30 ENCOUNTER — Telehealth: Payer: Self-pay | Admitting: *Deleted

## 2014-10-30 ENCOUNTER — Other Ambulatory Visit: Payer: Self-pay | Admitting: Nurse Practitioner

## 2014-10-30 ENCOUNTER — Ambulatory Visit (HOSPITAL_BASED_OUTPATIENT_CLINIC_OR_DEPARTMENT_OTHER): Payer: Medicare Other | Admitting: Nurse Practitioner

## 2014-10-30 ENCOUNTER — Encounter: Payer: Self-pay | Admitting: Nurse Practitioner

## 2014-10-30 ENCOUNTER — Other Ambulatory Visit: Payer: Self-pay | Admitting: Radiology

## 2014-10-30 ENCOUNTER — Ambulatory Visit (HOSPITAL_COMMUNITY)
Admission: RE | Admit: 2014-10-30 | Discharge: 2014-10-30 | Disposition: A | Discharge status: Home / Self Care | Payer: Medicare Other | Source: Ambulatory Visit | Attending: Nurse Practitioner | Admitting: Nurse Practitioner

## 2014-10-30 ENCOUNTER — Telehealth: Payer: Self-pay | Admitting: Nurse Practitioner

## 2014-10-30 DIAGNOSIS — R188 Other ascites: Secondary | ICD-10-CM | POA: Diagnosis not present

## 2014-10-30 DIAGNOSIS — J910 Malignant pleural effusion: Secondary | ICD-10-CM

## 2014-10-30 DIAGNOSIS — Z9889 Other specified postprocedural states: Secondary | ICD-10-CM

## 2014-10-30 DIAGNOSIS — C482 Malignant neoplasm of peritoneum, unspecified: Secondary | ICD-10-CM

## 2014-10-30 DIAGNOSIS — J90 Pleural effusion, not elsewhere classified: Secondary | ICD-10-CM | POA: Diagnosis present

## 2014-10-30 DIAGNOSIS — R180 Malignant ascites: Secondary | ICD-10-CM | POA: Insufficient documentation

## 2014-10-30 DIAGNOSIS — J9811 Atelectasis: Secondary | ICD-10-CM | POA: Diagnosis not present

## 2014-10-30 NOTE — Assessment & Plan Note (Signed)
Patient is also underwent previous paracentesis as well.  Last paracentesis was obtained on 10/23/2014.  Patient is now complaining of increased abdominal distention and feeling of bloating.  She denies any nausea, vomiting, diarrhea, or constipation.  She denies any recent fevers or chills.  Have ordered a paracentesis to be performed during the omental biopsy already scheduled for Monday, 11/02/2014.  Patient was advised to go directly to the emergency department of the weekend if her abdominal distention becomes too uncomfortable.

## 2014-10-30 NOTE — Assessment & Plan Note (Signed)
Patient last received her carboplatin/Taxol chemotherapy on 10/28/2014.  She is scheduled to return on 11/18/2014 for her next chemotherapy.  Also, patient is scheduled for an omental biopsy this coming Monday, 11/02/2014.

## 2014-10-30 NOTE — Procedures (Signed)
Successful US guided left thoracentesis. Yielded 400 ml of clear yellow fluid. Pt tolerated procedure well. No immediate complications.  Specimen was not sent for labs. CXR ordered.  Tsosie Billing D PA-C 10/30/2014 4:04 PM

## 2014-10-30 NOTE — Telephone Encounter (Signed)
Call in for [place] from pt stating she is having increased SOB since Wednesday.  Per phone discussion pt states she had chemo Wednesday and a paracentesis on 4/15.  " and where they stuck me is still very tender and hard and when I stand up it seems even more elongated"  Lynn Ramirez has inhalers in the home and is using them.  Per phone discussion appointment made for pt to be seen in symptom management.

## 2014-10-30 NOTE — Telephone Encounter (Signed)
Spoke to pt husband- pt is still in radiology for her thoracentesis. Advised pt husband if SOB does not improve or continues to get worse over the weekend he needs to bring pt to the ED. Husband understands. Advised that on Monday they may be doing a paracentesis along with her port placement and biopsy.

## 2014-10-30 NOTE — Telephone Encounter (Signed)
Patient called complaining of both shortness of breath and increased abdominal distention; with increased feeling of bloating.  She denies any chest pain, chest pressure, or pain with inspiration.  Patient states that she has home oxygen to use on an as-needed basis; and started using on a 24/7 basis last night.  Patient feels that she very well may need both a thoracentesis and a paracentesis.  Was able to obtain a work in appointment for a therapeutic thoracentesis this afternoon.  Patient is scheduled for an abdominal biopsy and a Port-A-Cath placement this coming Monday, 11/02/2014.  Have ordered a paracentesis be performed if needed on Monday during the biopsy.  Also, advised patient to go directed to the emergency department if her dyspnea has not improved following her thoracentesis.  Patient stated understanding of all instructions; and was in agreement with this plan of care.

## 2014-10-30 NOTE — Progress Notes (Signed)
SYMPTOM MANAGEMENT CLINIC   HPI: Lynn Ramirez 74 y.o. female diagnosed with primary peritoneal carcinoma.  Currently undergoing carboplatin/Taxol chemotherapy regimen.  Patient previously diagnosed with right malignant pleural effusion.  Patient has had multiple thoracentesis in the recent past.  Patient complaining of increased dyspnea for the past few days.  She feels she may very well need a repeat thoracentesis.  Patient has home O2 to use on an as-needed basis.  Patient states she started wearing the oxygen 24/7 last night for comfort.  Patient denies any chest pain, chest pressure, or pain with inspiration.  Patient is also complaining of increased abdominal distention and feeling of bloating.  She denies any nausea, vomiting, diarrhea, or constipation.  Chest denies any recent fevers or chills.   HPI  ROS  Past Medical History  Diagnosis Date  . H/O hydronephrosis   . Hypertension   . Emphysema   . COPD (chronic obstructive pulmonary disease)     Past Surgical History  Procedure Laterality Date  . Nephrectomy    . Tonsilectomy, adenoidectomy, bilateral myringotomy and tubes    . Tubal ligation    . Hand surgery Right 1980's    has COPD (chronic obstructive pulmonary disease); Acute asthmatic bronchitis; Encopresis; COPD exacerbation; Pleural effusion; Pelvic mass in female; Insomnia; Metastatic adenocarcinoma; Primary peritoneal carcinomatosis; and Ascites on her problem list.    is allergic to penicillins.    Medication List       This list is accurate as of: 10/30/14  6:06 PM.  Always use your most recent med list.               acetaminophen 325 MG tablet  Commonly known as:  TYLENOL  Take 650 mg by mouth as needed.     antiseptic oral rinse Liqd  15 mLs by Mouth Rinse route 3 (three) times daily.     calcium carbonate 600 MG Tabs tablet  Commonly known as:  OS-CAL  Take 600 mg by mouth daily with breakfast.     cholecalciferol 1000 UNITS tablet   Commonly known as:  VITAMIN D  Take 1,000 Units by mouth every morning.     dexamethasone 2 MG tablet  Commonly known as:  DECADRON  Take 5 tablets (10 mg total) by mouth 2 (two) times daily with a meal. At 10 PM the night before and 6 AM the morning of FIRST chemo.     diphenhydrAMINE 25 MG tablet  Commonly known as:  BENADRYL  Take 25 mg by mouth every 6 (six) hours as needed for allergies or sleep.     fish oil-omega-3 fatty acids 1000 MG capsule  Take 1 g by mouth every morning.     lidocaine-prilocaine cream  Commonly known as:  EMLA  Apply to port site one hour prior to use. Do not rub in. Cover with plastic.     loratadine 10 MG tablet  Commonly known as:  CLARITIN  Take 10 mg by mouth at bedtime.     PRESCRIPTION MEDICATION  Doctor to call in a steroid for patient to start taking before coming in. Patient not sure of name.     PROAIR HFA 108 (90 BASE) MCG/ACT inhaler  Generic drug:  albuterol  INHALE 2 PUFFS BY MOUTH FOUR TIMES DAILY AS NEEDED FOR WHEEZING     prochlorperazine 5 MG tablet  Commonly known as:  COMPAZINE  Take 1 tablet (5 mg total) by mouth every 6 (six) hours as needed for nausea or  vomiting.     sodium chloride 0.65 % Soln nasal spray  Commonly known as:  OCEAN  Place 1-2 sprays into both nostrils 2 (two) times daily as needed for congestion.     SYMBICORT 160-4.5 MCG/ACT inhaler  Generic drug:  budesonide-formoterol  INHALE 2 PUFFS BY MOUTH TWICE DAILY     Tiotropium Bromide Monohydrate 2.5 MCG/ACT Aers  Commonly known as:  SPIRIVA RESPIMAT  Inhale 2 puffs into the lungs daily.     triamterene-hydrochlorothiazide 37.5-25 MG per tablet  Commonly known as:  MAXZIDE-25  Take 1 tablet by mouth every morning.     vitamin C 500 MG tablet  Commonly known as:  ASCORBIC ACID  Take 500 mg by mouth every morning.     zaleplon 5 MG capsule  Commonly known as:  SONATA  Take 1 capsule (5 mg total) by mouth at bedtime as needed for sleep.           PHYSICAL EXAMINATION  Oncology Vitals 10/28/2014 10/28/2014 10/28/2014 10/28/2014 10/28/2014 10/28/2014 10/28/2014  Height - - - - - - -  Weight - - - - - - -  Weight (lbs) - - - - - - -  BMI (kg/m2) - - - - - - -  Temp 98.1 98.6 98 97.7 97.8 97.8 97.7  Pulse 76 81 77 85 78 84 87  Resp _0 SpO2 95 96 95 96 99 95 99  BSA (m2) - - - - - - -   BP Readings from Last 3 Encounters:  10/30/14 116/60  10/28/14 140/67  10/23/14 125/69    Physical Exam  Constitutional: She is oriented to person, place, and time. She appears unhealthy.  HENT:  Head: Normocephalic and atraumatic.  Eyes: Conjunctivae and EOM are normal. Pupils are equal, round, and reactive to light. Right eye exhibits no discharge. Left eye exhibits no discharge. No scleral icterus.  Neck: Normal range of motion.  Pulmonary/Chest: No respiratory distress.  Patient appears short of breath on exam.  She is wearing O2 via nasal cannula.  In no acute respiratory distress.  Musculoskeletal: Normal range of motion.  Neurological: She is alert and oriented to person, place, and time. Gait normal.  Psychiatric: Affect normal.    LABORATORY DATA:. No visits with results within 3 Day(s) from this visit. Latest known visit with results is:  Appointment on 10/13/2014  Component Date Value Ref Range Status  . WBC 10/14/2014 9.7  3.9 - 10.3 10e3/uL Final  . NEUT# 10/14/2014 8.4* 1.5 - 6.5 10e3/uL Final  . HGB 10/14/2014 12.9  11.6 - 15.9 g/dL Final  . HCT 10/14/2014 40.3  34.8 - 46.6 % Final  . Platelets 10/14/2014 416* 145 - 400 10e3/uL Final  . MCV 10/14/2014 88.7  79.5 - 101.0 fL Final  . MCH 10/14/2014 28.4  25.1 - 34.0 pg Final  . MCHC 10/14/2014 32.1  31.5 - 36.0 g/dL Final  . RBC 10/14/2014 4.54  3.70 - 5.45 10e6/uL Final  . RDW 10/14/2014 15.4* 11.2 - 14.5 % Final  . lymph# 10/14/2014 0.7* 0.9 - 3.3 10e3/uL Final  . MONO# 10/14/2014 0.5  0.1 - 0.9 10e3/uL Final  . Eosinophils Absolute 10/14/2014 0.0   0.0 - 0.5 10e3/uL Final  . Basophils Absolute 10/14/2014 0.1  0.0 - 0.1 10e3/uL Final  . NEUT% 10/14/2014 86.8* 38.4 - 76.8 % Final  . LYMPH% 10/14/2014 7.1* 14.0 - 49.7 % Final  . MONO% 10/14/2014 5.3  0.0 -  14.0 % Final  . EOS% 10/14/2014 0.3  0.0 - 7.0 % Final  . BASO% 10/14/2014 0.5  0.0 - 2.0 % Final  . Sodium 10/14/2014 138  136 - 145 mEq/L Final  . Potassium 10/14/2014 3.6  3.5 - 5.1 mEq/L Final  . Chloride 10/14/2014 92* 98 - 109 mEq/L Final  . CO2 10/14/2014 37* 22 - 29 mEq/L Final  . Glucose 10/14/2014 119  70 - 140 mg/dl Final  . BUN 10/14/2014 16.8  7.0 - 26.0 mg/dL Final  . Creatinine 10/14/2014 1.0  0.6 - 1.1 mg/dL Final  . Total Bilirubin 10/14/2014 0.31  0.20 - 1.20 mg/dL Final  . Alkaline Phosphatase 10/14/2014 65  40 - 150 U/L Final  . AST 10/14/2014 17  5 - 34 U/L Final  . ALT 10/14/2014 23  0 - 55 U/L Final  . Total Protein 10/14/2014 6.0* 6.4 - 8.3 g/dL Final  . Albumin 10/14/2014 2.0* 3.5 - 5.0 g/dL Final  . Calcium 10/14/2014 9.9  8.4 - 10.4 mg/dL Final  . Anion Gap 10/14/2014 9  3 - 11 mEq/L Final  . EGFR 10/14/2014 60* >90 ml/min/1.73 m2 Final   eGFR is calculated using the CKD-EPI Creatinine Equation (2009)  . CA 125 10/14/2014 4121* <35 U/mL Final   Comment:  This test was performed using the [place] chemiluminescentmethod.  Values obtained from different assay methods cannot be usedinterchangeably.  CA125 levels , regardless of value, should not beinterpreted as absolute evidence of the presence or  absence ofdisease.      RADIOGRAPHIC STUDIES: Dg Chest 1 View  10/30/2014   CLINICAL DATA:  Post left thoracentesis  EXAM: CHEST  1 VIEW  COMPARISON:  10/21/2014  FINDINGS: Bilateral pleural effusions unchanged from the prior study. Bibasilar atelectasis unchanged  Negative for pneumothorax post left thoracentesis.  Negative for heart failure.  IMPRESSION: Negative for pneumothorax post left thoracentesis.   Electronically Signed   By: Franchot Gallo  M.D.   On: 10/30/2014 16:23   US Thoracentesis Asp Pleural Space W/img Guide  10/30/2014   INDICATION: Symptomatic left sided pleural effusion.  EXAM: US THORACENTESIS ASP PLEURAL SPACE W/IMG GUIDE  COMPARISON:  Right thoracentesis 10/21/14.  MEDICATIONS: None  COMPLICATIONS: None immediate  TECHNIQUE: Informed written consent was obtained from the patient after a discussion of the risks, benefits and alternatives to treatment. A timeout was performed prior to the initiation of the procedure.  Initial ultrasound scanning demonstrates a small left pleural effusion. The lower chest was prepped and draped in the usual sterile fashion. 1% lidocaine was used for local anesthesia.  Under direct ultrasound guidance, a 19 gauge, 7-cm, Yueh catheter was introduced. An ultrasound image was saved for documentation purposes. The thoracentesis was performed. The catheter was removed and a dressing was applied. The patient tolerated the procedure well without immediate post procedural complication. The patient was escorted to have an upright chest radiograph.  FINDINGS: A total of approximately 400 ml of serous fluid was removed.  IMPRESSION: Successful ultrasound-guided left sided thoracentesis yielding 400 ml of pleural fluid.  Read By:  Tsosie Billing PA-C   Electronically Signed   By: Markus Daft M.D.   On: 10/30/2014 16:13    ASSESSMENT/PLAN:    Pleural effusion Patient previously diagnosed with right malignant pleural effusion.  Patient has had multiple thoracentesis in the recent past.  Patient complaining of increased dyspnea for the past few days.  She feels she may very well need a repeat thoracentesis.  Patient  has home O2 to use on an as-needed basis.  Patient states she started wearing the oxygen 24/7 last night for comfort.  Patient denies any chest pain, chest pressure, or pain with inspiration.  During very brief exam-patient did appear short of breath.  Patient was wearing O2 at 2 L via nasal  cannula.  Was able to arrange for a work in appointment for a repeat right therapeutic thoracentesis this afternoon.  Advised patient to go directly to the emergency department if she continues with dyspnea following her thoracentesis this afternoon.  Both patient and her husband stated understanding of all instructions and were in agreement with this plan of care.     Primary peritoneal carcinomatosis Patient last received her carboplatin/Taxol chemotherapy on 10/28/2014.  She is scheduled to return on 11/18/2014 for her next chemotherapy.  Also, patient is scheduled for an omental biopsy this coming Monday, 11/02/2014.   Ascites Patient is also underwent previous paracentesis as well.  Last paracentesis was obtained on 10/23/2014.  Patient is now complaining of increased abdominal distention and feeling of bloating.  She denies any nausea, vomiting, diarrhea, or constipation.  She denies any recent fevers or chills.  Have ordered a paracentesis to be performed during the omental biopsy already scheduled for Monday, 11/02/2014.  Patient was advised to go directly to the emergency department of the weekend if her abdominal distention becomes too uncomfortable.   Patient stated understanding of all instructions; and was in agreement with this plan of care. The patient knows to call the clinic with any problems, questions or concerns.   Review/collaboration with Dr. Benay Spice regarding all aspects of patient's visit today.   Total time spent with patient was 15 minutes;  with greater than 75 percent of that time spent in face to face counseling regarding patient's symptoms,  and coordination of care and follow up.  Disclaimer: This note was dictated with voice recognition software. Similar sounding words can inadvertently be transcribed and may not be corrected upon review.   Drue Second, NP 10/30/2014

## 2014-10-30 NOTE — Assessment & Plan Note (Signed)
Patient previously diagnosed with right malignant pleural effusion.  Patient has had multiple thoracentesis in the recent past.  Patient complaining of increased dyspnea for the past few days.  She feels she may very well need a repeat thoracentesis.  Patient has home O2 to use on an as-needed basis.  Patient states she started wearing the oxygen 24/7 last night for comfort.  Patient denies any chest pain, chest pressure, or pain with inspiration.  During very brief exam-patient did appear short of breath.  Patient was wearing O2 at 2 L via nasal cannula.  Was able to arrange for a work in appointment for a repeat right therapeutic thoracentesis this afternoon.  Advised patient to go directly to the emergency department if she continues with dyspnea following her thoracentesis this afternoon.  Both patient and her husband stated understanding of all instructions and were in agreement with this plan of care.

## 2014-10-31 ENCOUNTER — Encounter (HOSPITAL_COMMUNITY): Payer: Self-pay | Admitting: Emergency Medicine

## 2014-10-31 ENCOUNTER — Emergency Department (HOSPITAL_COMMUNITY)
Admission: EM | Admit: 2014-10-31 | Discharge: 2014-10-31 | Disposition: A | Discharge status: Home / Self Care | Payer: Medicare Other | Attending: Emergency Medicine | Admitting: Emergency Medicine

## 2014-10-31 ENCOUNTER — Emergency Department (HOSPITAL_COMMUNITY): Payer: Medicare Other

## 2014-10-31 DIAGNOSIS — J449 Chronic obstructive pulmonary disease, unspecified: Secondary | ICD-10-CM | POA: Insufficient documentation

## 2014-10-31 DIAGNOSIS — I10 Essential (primary) hypertension: Secondary | ICD-10-CM | POA: Diagnosis not present

## 2014-10-31 DIAGNOSIS — Z87891 Personal history of nicotine dependence: Secondary | ICD-10-CM | POA: Diagnosis not present

## 2014-10-31 DIAGNOSIS — C569 Malignant neoplasm of unspecified ovary: Secondary | ICD-10-CM | POA: Diagnosis not present

## 2014-10-31 DIAGNOSIS — R197 Diarrhea, unspecified: Secondary | ICD-10-CM | POA: Insufficient documentation

## 2014-10-31 DIAGNOSIS — Z87448 Personal history of other diseases of urinary system: Secondary | ICD-10-CM | POA: Insufficient documentation

## 2014-10-31 DIAGNOSIS — Z880 Allergy status to penicillin: Secondary | ICD-10-CM | POA: Diagnosis not present

## 2014-10-31 DIAGNOSIS — R112 Nausea with vomiting, unspecified: Secondary | ICD-10-CM | POA: Diagnosis not present

## 2014-10-31 DIAGNOSIS — Z79899 Other long term (current) drug therapy: Secondary | ICD-10-CM | POA: Insufficient documentation

## 2014-10-31 DIAGNOSIS — K8020 Calculus of gallbladder without cholecystitis without obstruction: Secondary | ICD-10-CM | POA: Diagnosis not present

## 2014-10-31 DIAGNOSIS — Z8543 Personal history of malignant neoplasm of ovary: Secondary | ICD-10-CM | POA: Diagnosis not present

## 2014-10-31 LAB — CBC WITH DIFFERENTIAL/PLATELET
BASOS ABS: 0 10*3/uL (ref 0.0–0.1)
Basophils Relative: 0 % (ref 0–1)
EOS ABS: 0 10*3/uL (ref 0.0–0.7)
Eosinophils Relative: 0 % (ref 0–5)
HEMATOCRIT: 38.8 % (ref 36.0–46.0)
Hemoglobin: 12.1 g/dL (ref 12.0–15.0)
Lymphocytes Relative: 8 % — ABNORMAL LOW (ref 12–46)
Lymphs Abs: 0.7 10*3/uL (ref 0.7–4.0)
MCH: 28.4 pg (ref 26.0–34.0)
MCHC: 31.2 g/dL (ref 30.0–36.0)
MCV: 91.1 fL (ref 78.0–100.0)
MONOS PCT: 1 % — AB (ref 3–12)
Monocytes Absolute: 0.1 10*3/uL (ref 0.1–1.0)
Neutro Abs: 7.3 10*3/uL (ref 1.7–7.7)
Neutrophils Relative %: 91 % — ABNORMAL HIGH (ref 43–77)
Platelets: 445 10*3/uL — ABNORMAL HIGH (ref 150–400)
RBC: 4.26 MIL/uL (ref 3.87–5.11)
RDW: 15.5 % (ref 11.5–15.5)
WBC: 8.1 10*3/uL (ref 4.0–10.5)

## 2014-10-31 LAB — COMPREHENSIVE METABOLIC PANEL
ALBUMIN: 2.6 g/dL — AB (ref 3.5–5.2)
ALT: 31 U/L (ref 0–35)
AST: 29 U/L (ref 0–37)
Alkaline Phosphatase: 52 U/L (ref 39–117)
Anion gap: 8 (ref 5–15)
BILIRUBIN TOTAL: 0.8 mg/dL (ref 0.3–1.2)
BUN: 29 mg/dL — ABNORMAL HIGH (ref 6–23)
CHLORIDE: 92 mmol/L — AB (ref 96–112)
CO2: 35 mmol/L — ABNORMAL HIGH (ref 19–32)
Calcium: 9.7 mg/dL (ref 8.4–10.5)
Creatinine, Ser: 0.87 mg/dL (ref 0.50–1.10)
GFR calc Af Amer: 76 mL/min — ABNORMAL LOW (ref >90)
GFR calc non Af Amer: 65 mL/min — ABNORMAL LOW (ref >90)
Glucose, Bld: 117 mg/dL — ABNORMAL HIGH (ref 70–99)
POTASSIUM: 3.9 mmol/L (ref 3.5–5.1)
SODIUM: 135 mmol/L (ref 135–145)
TOTAL PROTEIN: 6.3 g/dL (ref 6.0–8.3)

## 2014-10-31 LAB — URINE MICROSCOPIC-ADD ON

## 2014-10-31 LAB — URINALYSIS, ROUTINE W REFLEX MICROSCOPIC
Bilirubin Urine: NEGATIVE
Glucose, UA: NEGATIVE mg/dL
Hgb urine dipstick: NEGATIVE
Ketones, ur: NEGATIVE mg/dL
Nitrite: NEGATIVE
PH: 7.5 (ref 5.0–8.0)
SPECIFIC GRAVITY, URINE: 1.01 (ref 1.005–1.030)
Urobilinogen, UA: 0.2 mg/dL (ref 0.0–1.0)

## 2014-10-31 LAB — LIPASE, BLOOD: Lipase: 23 U/L (ref 11–59)

## 2014-10-31 MED ORDER — SODIUM CHLORIDE 0.9 % IV BOLUS (SEPSIS)
500.0000 mL | Freq: Once | INTRAVENOUS | Status: AC
  Administered 2014-10-31: 500 mL via INTRAVENOUS

## 2014-10-31 MED ORDER — ONDANSETRON 4 MG PO TBDP: 4.0000 mg | ORAL_TABLET | Freq: Three times a day (TID) | ORAL | Status: DC | PRN

## 2014-10-31 MED ORDER — ONDANSETRON HCL 4 MG/2ML IJ SOLN
4.0000 mg | Freq: Once | INTRAMUSCULAR | Status: AC
  Administered 2014-10-31: 4 mg via INTRAVENOUS
  Filled 2014-10-31: qty 2

## 2014-10-31 NOTE — ED Notes (Signed)
Pt states she has ovarian cancer.  States she began having NV since 0600 this morning.  Thoracentesis yesterday which helped her chronic SOB.  States that she is on 2 L 02 at home.

## 2014-10-31 NOTE — ED Notes (Signed)
MD at bedside. 

## 2014-10-31 NOTE — ED Provider Notes (Signed)
CSN: [id number]     Arrival date & time 10/31/14  1129 History   First MD Initiated Contact with Patient 10/31/14 1143     Chief Complaint  Patient presents with  . Cancer  . Nausea  . Emesis   Lynn Ramirez is a 74 y.o. female with a history of hypertension, COPD and currently undergoing treatment for ovarian cancer who presents to the emergency department complaining of nausea and vomiting ongoing since yesterday. The patient reports she last had chemotherapy 3 days ago and has been feeling fine until yesterday. She reports vomiting approximately 7 times in the past 24 hours. She also reports 2 loose stools the past 24 hours. She denies any abdominal pain. She reports vomiting each time she tries to eat or drink anything. She reports taking Compazine at home without relief. The patient had a therapeutic thoracentesis yesterday due to shortness of breath. She reports her shortness of breath has greatly improved after this procedure. She reports feeling well enough that she could go without her oxygen. The patient is on 2 L of oxygen via nasal cannula at home. The patient reports abdominal distention that is chronic and unchanged in the past week or so. She is due to have a biopsy and therapeutic paracentesis in 2 days. The patient denies fevers, chills, urinary symptoms, hematochezia, hematemesis, chest pain, palpitations, or shortness of breath.  (Consider location/radiation/quality/duration/timing/severity/associated sxs/prior Treatment) HPI  Past Medical History  Diagnosis Date  . H/O hydronephrosis   . Hypertension   . Emphysema   . COPD (chronic obstructive pulmonary disease)   . Cancer     ovarian   Past Surgical History  Procedure Laterality Date  . Nephrectomy    . Tonsilectomy, adenoidectomy, bilateral myringotomy and tubes    . Tubal ligation    . Hand surgery Right 1980's  . Thoracentesis     Family History  Problem Relation Age of Onset  . Liver cancer Father   .  Diabetes Father   . Liver cancer Mother   . Diabetes Paternal Grandfather   . Colon cancer Neg Hx   . Stomach cancer Neg Hx    History  Substance Use Topics  . Smoking status: Former Smoker -- 1.00 packs/day for 40 years    Types: Cigarettes    Quit date: 07/10/2013  . Smokeless tobacco: Never Used     Comment: Pt states that she is has struggled with smoking since Jan--1 cig here and there 04/09/14  . Alcohol Use: Yes     Comment: social    OB History    No data available     Review of Systems  Constitutional: Negative for fever and chills.  HENT: Negative for congestion and sore throat.   Eyes: Negative for visual disturbance.  Respiratory: Negative for shortness of breath and wheezing.   Cardiovascular: Negative for chest pain and palpitations.  Gastrointestinal: Positive for nausea, vomiting and diarrhea. Negative for abdominal pain and blood in stool.  Genitourinary: Negative for dysuria, urgency, frequency, hematuria and difficulty urinating.  Musculoskeletal: Negative for back pain and neck pain.  Skin: Negative for rash.  Neurological: Negative for weakness, light-headedness and headaches.      Allergies  Penicillins  Home Medications   Prior to Admission medications   Medication Sig Start Date End Date Taking? Authorizing Provider  acetaminophen (TYLENOL) 325 MG tablet Take 650 mg by mouth every 6 (six) hours as needed (for pain).    Yes Historical Provider, MD  antiseptic oral rinse (  BIOTENE) LIQD 15 mLs by Mouth Rinse route 3 (three) times daily.   Yes Historical Provider, MD  calcium carbonate (OS-CAL) 600 MG TABS Take 600 mg by mouth daily with breakfast.    Yes Historical Provider, MD  cholecalciferol (VITAMIN D) 1000 UNITS tablet Take 1,000 Units by mouth every morning.    Yes Historical Provider, MD  dexamethasone (DECADRON) 2 MG tablet Take 5 tablets (10 mg total) by mouth 2 (two) times daily with a meal. At 10 PM the night before and 6 AM the morning of  FIRST chemo. 10/26/14  Yes Ladell Pier, MD  fish oil-omega-3 fatty acids 1000 MG capsule Take 1 g by mouth every morning.    Yes Historical Provider, MD  lidocaine-prilocaine (EMLA) cream Apply to port site one hour prior to use. Do not rub in. Cover with plastic. 10/28/14  Yes Ladell Pier, MD  loratadine (CLARITIN) 10 MG tablet Take 10 mg by mouth at bedtime.    Yes Historical Provider, MD  Trooper   Yes Historical Provider, MD  PROAIR HFA 108 (90 BASE) MCG/ACT inhaler INHALE 2 PUFFS BY MOUTH FOUR TIMES DAILY AS NEEDED FOR WHEEZING 07/08/14  Yes Kathee Delton, MD  prochlorperazine (COMPAZINE) 5 MG tablet Take 1 tablet (5 mg total) by mouth every 6 (six) hours as needed for nausea or vomiting. 10/26/14  Yes Ladell Pier, MD  sodium chloride (OCEAN) 0.65 % SOLN nasal spray Place 1-2 sprays into both nostrils daily as needed for congestion.    Yes Historical Provider, MD  SYMBICORT 160-4.5 MCG/ACT inhaler INHALE 2 PUFFS BY MOUTH TWICE DAILY 05/13/14  Yes Kathee Delton, MD  Tiotropium Bromide Monohydrate (SPIRIVA RESPIMAT) 2.5 MCG/ACT AERS Inhale 2 puffs into the lungs daily. 05/01/14  Yes Kathee Delton, MD  triamterene-hydrochlorothiazide (MAXZIDE-25) 37.5-25 MG per tablet Take 1 tablet by mouth every morning.    Yes Historical Provider, MD  zaleplon (SONATA) 5 MG capsule Take 1 capsule (5 mg total) by mouth at bedtime as needed for sleep. 10/08/14  Yes Deneise Lever, MD  ondansetron (ZOFRAN ODT) 4 MG disintegrating tablet Take 1 tablet (4 mg total) by mouth every 8 (eight) hours as needed for nausea or vomiting. 10/31/14   Waynetta Pean, PA-C   BP 160/79 mmHg  Pulse 100  Temp(Src) 98 F (36.7 C) (Oral)  Resp 22  SpO2 95% Physical Exam  Constitutional: She is oriented to person, place, and time. She appears well-developed and well-nourished. No distress.  HENT:  Head: Normocephalic and atraumatic.  Mouth/Throat: Oropharynx is clear and moist.  Eyes:  Conjunctivae are normal. Pupils are equal, round, and reactive to light. Right eye exhibits no discharge. Left eye exhibits no discharge.  Neck: Neck supple. No JVD present. No tracheal deviation present.  Cardiovascular: Regular rhythm, normal heart sounds and intact distal pulses.  Exam reveals no gallop and no friction rub.   No murmur heard. Bilateral radial pulses are intact. Mildly tachycardic with a heart rate of 108.  Pulmonary/Chest: Effort normal. No respiratory distress. She has no wheezes. She has no rales.  Slightly diminished in her right lower base, lungs are otherwise clear to ascultation. Speaking in full sentences.   Abdominal: Soft. Bowel sounds are normal. She exhibits distension. There is no tenderness.  Abdomen is soft and bowel sounds are present. Abdomen is moderately distended with ascites. No abdominal tenderness to palpation.   Musculoskeletal: She exhibits edema.  Mild amount of bilateral lower extremity  edema.   Lymphadenopathy:    She has no cervical adenopathy.  Neurological: She is alert and oriented to person, place, and time. Coordination normal.  Skin: Skin is warm and dry. No rash noted. She is not diaphoretic. No erythema. No pallor.  Psychiatric: She has a normal mood and affect. Her behavior is normal.  Nursing note and vitals reviewed.   ED Course  Procedures (including critical care time) Labs Review Labs Reviewed  CBC WITH DIFFERENTIAL/PLATELET - Abnormal; Notable for the following:    Platelets 445 (*)    Neutrophils Relative % 91 (*)    Lymphocytes Relative 8 (*)    Monocytes Relative 1 (*)    All other components within normal limits  COMPREHENSIVE METABOLIC PANEL - Abnormal; Notable for the following:    Chloride 92 (*)    CO2 35 (*)    Glucose, Bld 117 (*)    BUN 29 (*)    Albumin 2.6 (*)    GFR calc non Af Amer 65 (*)    GFR calc Af Amer 76 (*)    All other components within normal limits  URINALYSIS, ROUTINE W REFLEX MICROSCOPIC -  Abnormal; Notable for the following:    Protein, ur TRACE (*)    Leukocytes, UA TRACE (*)    All other components within normal limits  LIPASE, BLOOD  URINE MICROSCOPIC-ADD ON    Imaging Review Dg Chest 1 View  10/30/2014   CLINICAL DATA:  Post left thoracentesis  EXAM: CHEST  1 VIEW  COMPARISON:  10/21/2014  FINDINGS: Bilateral pleural effusions unchanged from the prior study. Bibasilar atelectasis unchanged  Negative for pneumothorax post left thoracentesis.  Negative for heart failure.  IMPRESSION: Negative for pneumothorax post left thoracentesis.   Electronically Signed   By: Franchot Gallo M.D.   On: 10/30/2014 16:23   Dg Abd Acute W/chest  10/31/2014   CLINICAL DATA:  Ovarian cancer. Nausea and vomiting. Acute nausea and vomiting with onset of symptoms at 6 a.m. Chronic shortness of breath.  EXAM: DG ABDOMEN ACUTE W/ 1V CHEST  COMPARISON:  10/30/2014.  FINDINGS: Small bilateral pleural effusions are present. There is no pneumothorax. The cardiopericardial silhouette is mildly enlarged. Healed fracture of the proximal RIGHT humerus. Monitoring leads project over the chest.  Aortic arch atherosclerosis. Calcified gallstone incidentally noted in the RIGHT upper quadrant. The bowel gas pattern is normal. No free air. Stool and bowel gas extends to the rectosigmoid.  IMPRESSION: 1. Normal bowel gas pattern. 2. Cholelithiasis. 3. Small bilateral pleural effusions with associated atelectasis. No pneumothorax status post recent thoracentesis.   Electronically Signed   By: Dereck Ligas M.D.   On: 10/31/2014 13:10   US Thoracentesis Asp Pleural Space W/img Guide  10/30/2014   INDICATION: Symptomatic left sided pleural effusion.  EXAM: US THORACENTESIS ASP PLEURAL SPACE W/IMG GUIDE  COMPARISON:  Right thoracentesis 10/21/14.  MEDICATIONS: None  COMPLICATIONS: None immediate  TECHNIQUE: Informed written consent was obtained from the patient after a discussion of the risks, benefits and alternatives to  treatment. A timeout was performed prior to the initiation of the procedure.  Initial ultrasound scanning demonstrates a small left pleural effusion. The lower chest was prepped and draped in the usual sterile fashion. 1% lidocaine was used for local anesthesia.  Under direct ultrasound guidance, a 19 gauge, 7-cm, Yueh catheter was introduced. An ultrasound image was saved for documentation purposes. The thoracentesis was performed. The catheter was removed and a dressing was applied. The patient tolerated the  procedure well without immediate post procedural complication. The patient was escorted to have an upright chest radiograph.  FINDINGS: A total of approximately 400 ml of serous fluid was removed.  IMPRESSION: Successful ultrasound-guided left sided thoracentesis yielding 400 ml of pleural fluid.  Read By:  Tsosie Billing PA-C   Electronically Signed   By: Markus Daft M.D.   On: 10/30/2014 16:13     EKG Interpretation None      Filed Vitals:   10/31/14 1435 10/31/14 1500 10/31/14 1515 10/31/14 1530  BP: 160/92 160/79    Pulse: 109 106 100 100  Temp: 98 F (36.7 C)     TempSrc:      Resp: 22 0 0 22  SpO2: 96% 98% 99% 95%     MDM   Meds given in ED:  Medications  sodium chloride 0.9 % bolus 500 mL (0 mLs Intravenous Stopped 10/31/14 1320)  ondansetron (ZOFRAN) injection 4 mg (4 mg Intravenous Given 10/31/14 1218)  sodium chloride 0.9 % bolus 500 mL (0 mLs Intravenous Stopped 10/31/14 1528)    New Prescriptions   ONDANSETRON (ZOFRAN ODT) 4 MG DISINTEGRATING TABLET    Take 1 tablet (4 mg total) by mouth every 8 (eight) hours as needed for nausea or vomiting.    Final diagnoses:  Non-intractable vomiting with nausea, vomiting of unspecified type   This is a 74 y.o. female with a history of hypertension, COPD and currently undergoing treatment for ovarian cancer who presents to the emergency department complaining of nausea and vomiting ongoing since yesterday. She denies abdominal pain  or hematemesis. On exam the patient is afebrile and nontoxic appearing. She is mildly tachycardic with a heart rate of 108.Marland Kitchen His abdomen is soft with mild distention from ascites. No abdominal tenderness appreciated. Urinalysis is negative for infection. CBC is unremarkable. CMP shows normal sodium and potassium is slightly decreased chloride and elevated bicarbonate. She is a normal anion gap. Her lipase is normal.acute abdominal series obtained which indicated a normal bowel gas pattern. After initial fluid bolus and Zofran the patient reports feeling better and has tolerated some ginger ale. She is still mildly tachycardic and thus will repeat half a liter fluid bolus and reevaluate. At revaluation the patient has tolerated ginger ale and her heart rate is 100. She reports feeling much better and is ready to be discharged. We'll discharge with prescription for Zofran and have close follow-up with her primary care and oncologist. I advised the patient to follow-up with their primary care provider this week. I advised the patient to return to the emergency department with new or worsening symptoms or new concerns. The patient verbalized understanding and agreement with plan.   This patient was discussed with and evaluated by Dr. Ashok Cordia who agrees with assessment and plan.    Waynetta Pean, PA-C 10/31/14 [place], MD 11/01/14 [phone]

## 2014-10-31 NOTE — Discharge Instructions (Signed)

## 2014-10-31 NOTE — ED Notes (Signed)
Patient transported to X-ray 

## 2014-10-31 NOTE — ED Notes (Signed)
Nurse currently starting IV 

## 2014-11-02 ENCOUNTER — Ambulatory Visit (HOSPITAL_COMMUNITY)
Admission: RE | Admit: 2014-11-02 | Discharge: 2014-11-02 | Disposition: A | Discharge status: Home / Self Care | Payer: Medicare Other | Source: Ambulatory Visit | Attending: Oncology | Admitting: Oncology

## 2014-11-02 ENCOUNTER — Other Ambulatory Visit: Payer: Self-pay | Admitting: Oncology

## 2014-11-02 ENCOUNTER — Encounter (HOSPITAL_COMMUNITY): Payer: Self-pay

## 2014-11-02 ENCOUNTER — Ambulatory Visit (HOSPITAL_COMMUNITY)
Admission: RE | Admit: 2014-11-02 | Discharge: 2014-11-02 | Disposition: A | Discharge status: Home / Self Care | Payer: Medicare Other | Source: Ambulatory Visit | Attending: Interventional Radiology | Admitting: Interventional Radiology

## 2014-11-02 ENCOUNTER — Ambulatory Visit (HOSPITAL_COMMUNITY)
Admission: RE | Admit: 2014-11-02 | Discharge: 2014-11-02 | Disposition: A | Discharge status: Home / Self Care | Payer: Medicare Other | Source: Ambulatory Visit | Attending: Gynecologic Oncology | Admitting: Gynecologic Oncology

## 2014-11-02 ENCOUNTER — Other Ambulatory Visit: Payer: Self-pay | Admitting: Gynecologic Oncology

## 2014-11-02 DIAGNOSIS — Z87891 Personal history of nicotine dependence: Secondary | ICD-10-CM | POA: Diagnosis not present

## 2014-11-02 DIAGNOSIS — C481 Malignant neoplasm of specified parts of peritoneum: Secondary | ICD-10-CM | POA: Diagnosis not present

## 2014-11-02 DIAGNOSIS — J439 Emphysema, unspecified: Secondary | ICD-10-CM | POA: Diagnosis not present

## 2014-11-02 DIAGNOSIS — C801 Malignant (primary) neoplasm, unspecified: Secondary | ICD-10-CM | POA: Diagnosis present

## 2014-11-02 DIAGNOSIS — Z905 Acquired absence of kidney: Secondary | ICD-10-CM | POA: Insufficient documentation

## 2014-11-02 DIAGNOSIS — C799 Secondary malignant neoplasm of unspecified site: Secondary | ICD-10-CM | POA: Diagnosis not present

## 2014-11-02 DIAGNOSIS — J910 Malignant pleural effusion: Secondary | ICD-10-CM | POA: Insufficient documentation

## 2014-11-02 DIAGNOSIS — Z79899 Other long term (current) drug therapy: Secondary | ICD-10-CM | POA: Diagnosis not present

## 2014-11-02 DIAGNOSIS — J449 Chronic obstructive pulmonary disease, unspecified: Secondary | ICD-10-CM | POA: Insufficient documentation

## 2014-11-02 DIAGNOSIS — Z452 Encounter for adjustment and management of vascular access device: Secondary | ICD-10-CM | POA: Diagnosis not present

## 2014-11-02 DIAGNOSIS — R188 Other ascites: Secondary | ICD-10-CM | POA: Diagnosis not present

## 2014-11-02 DIAGNOSIS — I10 Essential (primary) hypertension: Secondary | ICD-10-CM | POA: Insufficient documentation

## 2014-11-02 DIAGNOSIS — Z8543 Personal history of malignant neoplasm of ovary: Secondary | ICD-10-CM | POA: Insufficient documentation

## 2014-11-02 LAB — PROTIME-INR
INR: 0.97 (ref 0.00–1.49)
Prothrombin Time: 13 seconds (ref 11.6–15.2)

## 2014-11-02 LAB — CBC
HEMATOCRIT: 37.3 % (ref 36.0–46.0)
Hemoglobin: 11.8 g/dL — ABNORMAL LOW (ref 12.0–15.0)
MCH: 29.4 pg (ref 26.0–34.0)
MCHC: 31.6 g/dL (ref 30.0–36.0)
MCV: 92.8 fL (ref 78.0–100.0)
Platelets: 346 10*3/uL (ref 150–400)
RBC: 4.02 MIL/uL (ref 3.87–5.11)
RDW: 15.1 % (ref 11.5–15.5)
WBC: 3.8 10*3/uL — AB (ref 4.0–10.5)

## 2014-11-02 LAB — APTT: aPTT: 30 seconds (ref 24–37)

## 2014-11-02 MED ORDER — HEPARIN SOD (PORK) LOCK FLUSH 100 UNIT/ML IV SOLN
INTRAVENOUS | Status: AC
  Filled 2014-11-02: qty 5

## 2014-11-02 MED ORDER — FENTANYL CITRATE (PF) 100 MCG/2ML IJ SOLN
INTRAMUSCULAR | Status: AC | PRN
  Administered 2014-11-02 (×2): 25 ug via INTRAVENOUS

## 2014-11-02 MED ORDER — MIDAZOLAM HCL 2 MG/2ML IJ SOLN
INTRAMUSCULAR | Status: AC | PRN
  Administered 2014-11-02 (×2): 0.5 mg via INTRAVENOUS
  Administered 2014-11-02: 1 mg via INTRAVENOUS
  Administered 2014-11-02: 0.5 mg via INTRAVENOUS

## 2014-11-02 MED ORDER — VANCOMYCIN HCL IN DEXTROSE 1-5 GM/200ML-% IV SOLN
1000.0000 mg | Freq: Once | INTRAVENOUS | Status: AC
  Administered 2014-11-02: 1000 mg via INTRAVENOUS
  Filled 2014-11-02: qty 200

## 2014-11-02 MED ORDER — LIDOCAINE-EPINEPHRINE 2 %-1:100000 IJ SOLN
INTRAMUSCULAR | Status: AC
  Filled 2014-11-02: qty 1

## 2014-11-02 MED ORDER — SODIUM CHLORIDE 0.9 % IV SOLN
Freq: Once | INTRAVENOUS | Status: AC
  Administered 2014-11-02: 09:00:00 via INTRAVENOUS

## 2014-11-02 MED ORDER — MIDAZOLAM HCL 2 MG/2ML IJ SOLN
INTRAMUSCULAR | Status: AC | PRN
  Administered 2014-11-02: 0.5 mg via INTRAVENOUS
  Administered 2014-11-02: 1 mg via INTRAVENOUS
  Administered 2014-11-02 (×2): 0.5 mg via INTRAVENOUS

## 2014-11-02 MED ORDER — MIDAZOLAM HCL 2 MG/2ML IJ SOLN
INTRAMUSCULAR | Status: AC
  Filled 2014-11-02: qty 6

## 2014-11-02 MED ORDER — LIDOCAINE HCL 1 % IJ SOLN
INTRAMUSCULAR | Status: AC
  Filled 2014-11-02: qty 20

## 2014-11-02 MED ORDER — FENTANYL CITRATE (PF) 100 MCG/2ML IJ SOLN
INTRAMUSCULAR | Status: AC
Start: 2014-11-02 — End: 2014-11-02
  Filled 2014-11-02: qty 4

## 2014-11-02 MED ORDER — HEPARIN SOD (PORK) LOCK FLUSH 100 UNIT/ML IV SOLN
INTRAVENOUS | Status: AC | PRN
  Administered 2014-11-02: 500 [IU] via INTRAVENOUS

## 2014-11-02 NOTE — Discharge Instructions (Signed)
Implanted Port Insertion, Care After °Refer to this sheet in the next few weeks. These instructions provide you with information on caring for yourself after your procedure. Your health care provider may also give you more specific instructions. Your treatment has been planned according to current medical practices, but problems sometimes occur. Call your health care provider if you have any problems or questions after your procedure. °WHAT TO EXPECT AFTER THE PROCEDURE °After your procedure, it is typical to have the following:  °· Discomfort at the port insertion site. Ice packs to the area will help. °· Bruising on the skin over the port. This will subside in 3-4 days. °HOME CARE INSTRUCTIONS °· After your port is placed, you will get a manufacturer's information card. The card has information about your port. Keep this card with you at all times.   °· Know what kind of port you have. There are many types of ports available.   °· Wear a medical alert bracelet in case of an emergency. This can help alert health care workers that you have a port.   °· The port can stay in for as long as your health care provider believes it is necessary.   °· A home health care nurse may give medicines and take care of the port.   °· You or a family member can get special training and directions for giving medicine and taking care of the port at home.   °SEEK MEDICAL CARE IF:  °· Your port does not flush or you are unable to get a blood return.   °· You have a fever or chills. °SEEK IMMEDIATE MEDICAL CARE IF: °· You have new fluid or pus coming from your incision.   °· You notice a bad smell coming from your incision site.   °· You have swelling, pain, or more redness at the incision or port site.   °· You have chest pain or shortness of breath. °Document Released: 04/16/2013 Document Revised: 07/01/2013 Document Reviewed: 04/16/2013 °[employer]® Patient Information ©2015 [employer]. This information is not intended to replace  advice given to you by your health care provider. Make sure you discuss any questions you have with your health care provider. °Implanted Port Home Guide °An implanted port is a type of central line that is placed under the skin. Central lines are used to provide IV access when treatment or nutrition needs to be given through a person's veins. Implanted ports are used for long-term IV access. An implanted port may be placed because:  °· You need IV medicine that would be irritating to the small veins in your hands or arms.   °· You need long-term IV medicines, such as antibiotics.   °· You need IV nutrition for a long period.   °· You need frequent blood draws for lab tests.   °· You need dialysis.   °Implanted ports are usually placed in the chest area, but they can also be placed in the upper arm, the abdomen, or the leg. An implanted port has two main parts:  °· Reservoir. The reservoir is round and will appear as a small, raised area under your skin. The reservoir is the part where a needle is inserted to give medicines or draw blood.   °· Catheter. The catheter is a thin, flexible tube that extends from the reservoir. The catheter is placed into a large vein. Medicine that is inserted into the reservoir goes into the catheter and then into the vein.   °HOW WILL I CARE FOR MY INCISION SITE? °Do not get the incision site wet. Bathe or   shower as directed by your health care provider.  HOW IS MY PORT ACCESSED? Special steps must be taken to access the port:   Before the port is accessed, a numbing cream can be placed on the skin. This helps numb the skin over the port site.   Your health care provider uses a sterile technique to access the port.  Your health care provider must put on a mask and sterile gloves.  The skin over your port is cleaned carefully with an antiseptic and allowed to dry.  The port is gently pinched between sterile gloves, and a needle is inserted into the port.  Only  "non-coring" port needles should be used to access the port. Once the port is accessed, a blood return should be checked. This helps ensure that the port is in the vein and is not clogged.   If your port needs to remain accessed for a constant infusion, a clear (transparent) bandage will be placed over the needle site. The bandage and needle will need to be changed every week, or as directed by your health care provider.   Keep the bandage covering the needle clean and dry. Do not get it wet. Follow your health care provider's instructions on how to take a shower or bath while the port is accessed.   If your port does not need to stay accessed, no bandage is needed over the port.  WHAT IS FLUSHING? Flushing helps keep the port from getting clogged. Follow your health care provider's instructions on how and when to flush the port. Ports are usually flushed with saline solution or a medicine called heparin. The need for flushing will depend on how the port is used.   If the port is used for intermittent medicines or blood draws, the port will need to be flushed:   After medicines have been given.   After blood has been drawn.   As part of routine maintenance.   If a constant infusion is running, the port may not need to be flushed.  HOW LONG WILL MY PORT STAY IMPLANTED? The port can stay in for as long as your health care provider thinks it is needed. When it is time for the port to come out, surgery will be done to remove it. The procedure is similar to the one performed when the port was put in.  WHEN SHOULD I SEEK IMMEDIATE MEDICAL CARE? When you have an implanted port, you should seek immediate medical care if:   You notice a bad smell coming from the incision site.   You have swelling, redness, or drainage at the incision site.   You have more swelling or pain at the port site or the surrounding area.   You have a fever that is not controlled with medicine. Document  Released: 06/26/2005 Document Revised: 04/16/2013 Document Reviewed: 03/03/2013 [facility] Patient Information 2015 [place], [place]. This information is not intended to replace advice given to you by your health care provider. Make sure you discuss any questions you have with your health care provider. Needle Biopsy Care After These instructions give you information on caring for yourself after your procedure. Your doctor may also give you more specific instructions. Call your doctor if you have any problems or questions after your procedure. HOME CARE  Rest for 4 hours after your biopsy, except for getting up to go to the bathroom or as told.  Keep the places where the needles were put in clean and dry.  Do not put  powder or lotion on the sites.  Do not shower until 24 hours after the test. Remove all bandages (dressings) before showering.  Remove all bandages at least once every day. Gently clean the sites with soap and water. Keep putting a new bandage on until the skin is closed. Finding out the results of your test Ask your doctor when your test results will be ready. Make sure you follow up and get the test results. GET HELP RIGHT AWAY IF:   You have shortness of breath or trouble breathing.  You have pain or cramping in your belly (abdomen).  You feel sick to your stomach (nauseous) or throw up (vomit).  Any of the places where the needles were put in:  Are puffy (swollen) or red.  Are sore or hot to the touch.  Are draining yellowish-white fluid (pus).  Are bleeding after 10 minutes of pressing down on the site. Have someone keep pressing on any place that is bleeding until you see a doctor.  You have any unusual pain that will not stop.  You have a fever. If you go to the emergency room, tell the nurse that you had a biopsy. Take this paper with you to show the nurse. MAKE SURE YOU:   Understand these instructions.  Will watch your condition.  Will get help right  away if you are not doing well or get worse. Document Released: 06/08/2008 Document Revised: 09/18/2011 Document Reviewed: 06/08/2008 [facility] Patient Information 2015 [place], [place]. This information is not intended to replace advice given to you by your health care provider. Make sure you discuss any questions you have with your health care provider. Conscious Sedation, Adult, Care After Refer to this sheet in the next few weeks. These instructions provide you with information on caring for yourself after your procedure. Your health care provider may also give you more specific instructions. Your treatment has been planned according to current medical practices, but problems sometimes occur. Call your health care provider if you have any problems or questions after your procedure. WHAT TO EXPECT AFTER THE PROCEDURE  After your procedure:  You may feel sleepy, clumsy, and have poor balance for several hours.  Vomiting may occur if you eat too soon after the procedure. HOME CARE INSTRUCTIONS  Do not participate in any activities where you could become injured for at least 24 hours. Do not:  Drive.  Swim.  Ride a bicycle.  Operate heavy machinery.  Cook.  Use power tools.  Climb ladders.  Work from a high place.  Do not make important decisions or sign legal documents until you are improved.  If you vomit, drink water, juice, or soup when you can drink without vomiting. Make sure you have little or no nausea before eating solid foods.  Only take over-the-counter or prescription medicines for pain, discomfort, or fever as directed by your health care provider.  Make sure you and your family fully understand everything about the medicines given to you, including what side effects may occur.  You should not drink alcohol, take sleeping pills, or take medicines that cause drowsiness for at least 24 hours.  If you smoke, do not smoke without supervision.  If you are feeling  better, you may resume normal activities 24 hours after you were sedated.  Keep all appointments with your health care provider. SEEK MEDICAL CARE IF:  Your skin is pale or bluish in color.  You continue to feel nauseous or vomit.  Your pain is getting worse  and is not helped by medicine.  You have bleeding or swelling.  You are still sleepy or feeling clumsy after 24 hours. SEEK IMMEDIATE MEDICAL CARE IF:  You develop a rash.  You have difficulty breathing.  You develop any type of allergic problem.  You have a fever. MAKE SURE YOU:  Understand these instructions.  Will watch your condition.  Will get help right away if you are not doing well or get worse. Document Released: 04/16/2013 Document Reviewed: 04/16/2013 [facility] Patient Information 2015 [place], [place]. This information is not intended to replace advice given to you by your health care provider. Make sure you discuss any questions you have with your health care provider.

## 2014-11-02 NOTE — Procedures (Signed)
Omental BX - 18 g core times four RIJV PAC SVC RA Para - 2 L No comp

## 2014-11-02 NOTE — H&P (Signed)
Chief Complaint: "I'm here for a biopsy, fluid off my belly and port a cath"  Referring Physician(s): Cross,Melissa D  History of Present Illness: Lynn Ramirez is a 74 y.o. female with recently diagnosed metastatic adenocarcinoma (probable primary peritoneal carcinoma)with malignant right pleural effusion, ascites and omental/mesenteric thickening. She presents today for CT-guided omental thickening biopsy, possible paracentesis as well as Port-A-Cath placement for chemotherapy.  Past Medical History  Diagnosis Date  . H/O hydronephrosis   . Hypertension   . Emphysema   . COPD (chronic obstructive pulmonary disease)   . Cancer     ovarian    Past Surgical History  Procedure Laterality Date  . Nephrectomy    . Tonsilectomy, adenoidectomy, bilateral myringotomy and tubes    . Tubal ligation    . Hand surgery Right 1980's  . Thoracentesis      Allergies: Penicillins  Medications: Prior to Admission medications   Medication Sig Start Date End Date Taking? Authorizing Provider  acetaminophen (TYLENOL) 325 MG tablet Take 650 mg by mouth every 6 (six) hours as needed (for pain).     Historical Provider, MD  antiseptic oral rinse (BIOTENE) LIQD 15 mLs by Mouth Rinse route 3 (three) times daily.    Historical Provider, MD  calcium carbonate (OS-CAL) 600 MG TABS Take 600 mg by mouth daily with breakfast.     Historical Provider, MD  cholecalciferol (VITAMIN D) 1000 UNITS tablet Take 1,000 Units by mouth every morning.     Historical Provider, MD  dexamethasone (DECADRON) 2 MG tablet Take 5 tablets (10 mg total) by mouth 2 (two) times daily with a meal. At 10 PM the night before and 6 AM the morning of FIRST chemo. 10/26/14   Ladell Pier, MD  fish oil-omega-3 fatty acids 1000 MG capsule Take 1 g by mouth every morning.     Historical Provider, MD  lidocaine-prilocaine (EMLA) cream Apply to port site one hour prior to use. Do not rub in. Cover with plastic. 10/28/14   Ladell Pier, MD  loratadine (CLARITIN) 10 MG tablet Take 10 mg by mouth at bedtime.     Historical Provider, MD  ondansetron (ZOFRAN ODT) 4 MG disintegrating tablet Take 1 tablet (4 mg total) by mouth every 8 (eight) hours as needed for nausea or vomiting. 10/31/14   Waynetta Pean, PA-C  [place]    Historical Provider, MD  PROAIR HFA 108 (90 BASE) MCG/ACT inhaler INHALE 2 PUFFS BY MOUTH FOUR TIMES DAILY AS NEEDED FOR WHEEZING 07/08/14   Kathee Delton, MD  prochlorperazine (COMPAZINE) 5 MG tablet Take 1 tablet (5 mg total) by mouth every 6 (six) hours as needed for nausea or vomiting. 10/26/14   Ladell Pier, MD  sodium chloride (OCEAN) 0.65 % SOLN nasal spray Place 1-2 sprays into both nostrils daily as needed for congestion.     Historical Provider, MD  SYMBICORT 160-4.5 MCG/ACT inhaler INHALE 2 PUFFS BY MOUTH TWICE DAILY 05/13/14   Kathee Delton, MD  Tiotropium Bromide Monohydrate (SPIRIVA RESPIMAT) 2.5 MCG/ACT AERS Inhale 2 puffs into the lungs daily. 05/01/14   Kathee Delton, MD  triamterene-hydrochlorothiazide (MAXZIDE-25) 37.5-25 MG per tablet Take 1 tablet by mouth every morning.     Historical Provider, MD  zaleplon (SONATA) 5 MG capsule Take 1 capsule (5 mg total) by mouth at bedtime as needed for sleep. 10/08/14   Deneise Lever, MD    Family History  Problem Relation Age of  Onset  . Liver cancer Father   . Diabetes Father   . Liver cancer Mother   . Diabetes Paternal Grandfather   . Colon cancer Neg Hx   . Stomach cancer Neg Hx     History   Social History  . Marital Status: Married    Spouse Name: N/A  . Number of Children: 3  . Years of Education: N/A   Occupational History  . RETIRED    Social History Main Topics  . Smoking status: Former Smoker -- 1.00 packs/day for 40 years    Types: Cigarettes    Quit date: 07/10/2013  . Smokeless tobacco: Never Used     Comment: Pt states that she is has struggled with smoking since Jan--1 cig  here and there 04/09/14  . Alcohol Use: Yes     Comment: social   . Drug Use: No  . Sexual Activity: Not on file   Other Topics Concern  . Not on file   Social History Narrative    ECOG Status:   Review of Systems  Constitutional: Negative for fever and chills.  Respiratory: Positive for cough.        Occ dyspnea with exertion  Cardiovascular: Negative for chest pain.  Gastrointestinal: Positive for nausea and abdominal distention. Negative for vomiting and blood in stool.  Genitourinary: Negative for dysuria and hematuria.  Musculoskeletal:       Occ back pain  Neurological: Negative for headaches.    Vital Signs: Blood pressure 117/56, heart rate 96, respirations 18, temperature 97.7, oxygen saturation is 97% on 2 L   Physical Exam  Constitutional: She is oriented to person, place, and time. She appears well-developed and well-nourished.  Cardiovascular: Normal rate and regular rhythm.   Pulmonary/Chest: Effort normal.  Mildly diminished breath sounds at both bases  Abdominal: Soft. Bowel sounds are normal. She exhibits distension. There is tenderness.  Musculoskeletal: Normal range of motion. She exhibits edema.  Neurological: She is alert and oriented to person, place, and time.    Imaging: Dg Chest 1 View  10/30/2014   CLINICAL DATA:  Post left thoracentesis  EXAM: CHEST  1 VIEW  COMPARISON:  10/21/2014  FINDINGS: Bilateral pleural effusions unchanged from the prior study. Bibasilar atelectasis unchanged  Negative for pneumothorax post left thoracentesis.  Negative for heart failure.  IMPRESSION: Negative for pneumothorax post left thoracentesis.   Electronically Signed   By: Franchot Gallo M.D.   On: 10/30/2014 16:23   Dg Chest 1 View  10/21/2014   CLINICAL DATA:  Status post right thoracentesis  EXAM: CHEST  1 VIEW  COMPARISON:  10/09/2014  FINDINGS: No pneumothorax is seen status post right thoracentesis.  Small bilateral pleural effusions. Associated bilateral  lower lobe opacities, likely atelectasis.  The heart is normal in size.  IMPRESSION: No pneumothorax is seen status post right thoracentesis.  Small bilateral pleural effusions.  Associated bilateral lower lobe opacities, likely atelectasis.   Electronically Signed   By: Julian Hy M.D.   On: 10/21/2014 13:41   Dg Chest 1 View  10/09/2014   CLINICAL DATA:  Right pleural effusion. Status post right thoracentesis.  EXAM: CHEST  1 VIEW  COMPARISON:  10/08/2014  FINDINGS: There has been reduction in the large right pleural effusion with improved aeration in the right lung.  Left lung remains clear.  Heart size and vascularity are normal.  No pneumothorax.  IMPRESSION: Interval decrease in the large right pleural effusion without pneumothorax.   Electronically Signed  By: Lorriane Shire M.D.   On: 10/09/2014 14:36   Dg Chest 2 View  10/08/2014   CLINICAL DATA:  Follow-up of known right pleural effusion; increasing shortness of breath and weakness; history of COPD and previous tobacco use  EXAM: CHEST  2 VIEW  COMPARISON:  CT scan of the chest of October 06, 2014 an chest x-ray of September 30, 2014  FINDINGS: There is a large right pleural effusion occupying approximately one half of the pleural space volume. There is no significant mediastinal shift. There is no left pleural effusion. The right upper lobe and the left lung are clear. The left heart border is normal. The pulmonary vascularity is not engorged. There is old deformity of the proximal right humerus.  IMPRESSION: Large right-sided pleural effusion not greatly changed from the CT scan of 2 days ago. It has increased in volume since the study of September 30, 2014.   Electronically Signed   By: David  Martinique   On: 10/08/2014 15:39   Ct Chest W Contrast  10/06/2014   CLINICAL DATA:  Malignant effusion secondary to GYN primary. Initially counter  EXAM: CT CHEST, ABDOMEN, AND PELVIS WITH CONTRAST  TECHNIQUE: Multidetector CT imaging of the chest, abdomen and  pelvis was performed following the standard protocol during bolus administration of intravenous contrast.  CONTRAST:  135mL OMNIPAQUE IOHEXOL 300 MG/ML  SOLN  COMPARISON:  None.  FINDINGS: CT CHEST FINDINGS  Mediastinum/Nodes: No axillary supraclavicular lymphadenopathy. Precarinal lymph node measures 10 mm short axis. Trace pericardial effusion. Esophagus is normal. No central pulmonary embolism.  Lungs/Pleura: There is a large loculated right pleural effusion occupying greater than 50% of the right hemi thorax. The right lower lobe is collapsed within the pleural fluid. No nodularity within the aerated right lung. The left lung is clear.  CT ABDOMEN AND PELVIS FINDINGS  Hepatobiliary: There is no focal hepatic lesion. There is fluid along the right hepatic margin. There is a large gallstone in the lumen of the gallbladder This gallstone measures up to 29 mm and is irregular (image 63, series 2)  Pancreas: Pancreas is normal. No ductal dilatation. No pancreatic inflammation.  Spleen: Normal spleen.  Adrenals/Urinary Tract: There is a nodule of the left adrenal gland measuring 13 mm diameter. This lesion has Hounsfield units equals 6 on the delayed imaging consistent benign adenoma. Small nodule of the right adrenal gland measures 6 mm. The right kidney is normal. Patient status post left nephrectomy. Right ureter and bladder are normal. There are high-density mild nodular thickening along the posterior wall bladder (image 107, series 2  Stomach/Bowel: Stomach, small bowel, and cecum normal. There is extensive edema and thickening within the central small bowel mesentery. The omentum of the transverse colon is markedly thickened with interspersed fluid. There several fluid collections along the ventral peritoneal surface. No discrete measurable nodule is present. The sigmoid colon is multiple diverticula. Rectum  Vascular/Lymphatic: Abdominal aorta is normal caliber. No retroperitoneal periportal lymphadenopathy.   Reproductive: Uterus normal. No ovarian enlargement identified. Ovaries are difficult to define.  Other: Moderate volume ascites throughout the abdomen.  Musculoskeletal: No aggressive osseous lesion.  IMPRESSION: Chest Impression:  1. Large loculated right pleural effusion is concerning for malignant effusion. 2. Borderline enlarged precarinal lymph node. 3. No suspicious pulmonary nodules.  Abdomen / Pelvis Impression:  1. Extensive thickening within the greater omentum and to lesser degree the small bowel mesentery. The omental thickening is concerning for a peritoneal carcinomatosis. No discrete nodules identified. 2. Moderate volume  ascites within the upper abdomen and pelvis. 3. No ovarian mass identified. 4. Large irregular gallstone within the lumen of the gallbladder. Recommend evaluation for gallbladder carcinoma and consider MRI with and without contrast is concern for such. 5. High-density thickening along the posterior wall of the bladder is indeterminate. Cannot exclude surface metastasis. 6. Left adrenal adenoma. 7. Left nephrectomy. These results will be called to the ordering clinician or representative by the Radiologist Assistant, and communication documented in the PACS or zVision Dashboard.   Electronically Signed   By: Suzy Bouchard M.D.   On: 10/06/2014 16:40   Ct Abdomen Pelvis W Contrast  10/06/2014   CLINICAL DATA:  Malignant effusion secondary to GYN primary. Initially counter  EXAM: CT CHEST, ABDOMEN, AND PELVIS WITH CONTRAST  TECHNIQUE: Multidetector CT imaging of the chest, abdomen and pelvis was performed following the standard protocol during bolus administration of intravenous contrast.  CONTRAST:  120mL OMNIPAQUE IOHEXOL 300 MG/ML  SOLN  COMPARISON:  None.  FINDINGS: CT CHEST FINDINGS  Mediastinum/Nodes: No axillary supraclavicular lymphadenopathy. Precarinal lymph node measures 10 mm short axis. Trace pericardial effusion. Esophagus is normal. No central pulmonary embolism.   Lungs/Pleura: There is a large loculated right pleural effusion occupying greater than 50% of the right hemi thorax. The right lower lobe is collapsed within the pleural fluid. No nodularity within the aerated right lung. The left lung is clear.  CT ABDOMEN AND PELVIS FINDINGS  Hepatobiliary: There is no focal hepatic lesion. There is fluid along the right hepatic margin. There is a large gallstone in the lumen of the gallbladder This gallstone measures up to 29 mm and is irregular (image 63, series 2)  Pancreas: Pancreas is normal. No ductal dilatation. No pancreatic inflammation.  Spleen: Normal spleen.  Adrenals/Urinary Tract: There is a nodule of the left adrenal gland measuring 13 mm diameter. This lesion has Hounsfield units equals 6 on the delayed imaging consistent benign adenoma. Small nodule of the right adrenal gland measures 6 mm. The right kidney is normal. Patient status post left nephrectomy. Right ureter and bladder are normal. There are high-density mild nodular thickening along the posterior wall bladder (image 107, series 2  Stomach/Bowel: Stomach, small bowel, and cecum normal. There is extensive edema and thickening within the central small bowel mesentery. The omentum of the transverse colon is markedly thickened with interspersed fluid. There several fluid collections along the ventral peritoneal surface. No discrete measurable nodule is present. The sigmoid colon is multiple diverticula. Rectum  Vascular/Lymphatic: Abdominal aorta is normal caliber. No retroperitoneal periportal lymphadenopathy.  Reproductive: Uterus normal. No ovarian enlargement identified. Ovaries are difficult to define.  Other: Moderate volume ascites throughout the abdomen.  Musculoskeletal: No aggressive osseous lesion.  IMPRESSION: Chest Impression:  1. Large loculated right pleural effusion is concerning for malignant effusion. 2. Borderline enlarged precarinal lymph node. 3. No suspicious pulmonary nodules.  Abdomen  / Pelvis Impression:  1. Extensive thickening within the greater omentum and to lesser degree the small bowel mesentery. The omental thickening is concerning for a peritoneal carcinomatosis. No discrete nodules identified. 2. Moderate volume ascites within the upper abdomen and pelvis. 3. No ovarian mass identified. 4. Large irregular gallstone within the lumen of the gallbladder. Recommend evaluation for gallbladder carcinoma and consider MRI with and without contrast is concern for such. 5. High-density thickening along the posterior wall of the bladder is indeterminate. Cannot exclude surface metastasis. 6. Left adrenal adenoma. 7. Left nephrectomy. These results will be called to the ordering clinician  or representative by the Radiologist Assistant, and communication documented in the PACS or zVision Dashboard.   Electronically Signed   By: Suzy Bouchard M.D.   On: 10/06/2014 16:40   US Paracentesis  10/23/2014   CLINICAL DATA:  74 year old female with a history of ovarian malignancy. She has been referred for therapeutic paracentesis.  EXAM: ULTRASOUND GUIDED  PARACENTESIS  COMPARISON:  None.  PROCEDURE: An ultrasound guided paracentesis was thoroughly discussed with the patient and questions answered. The benefits, risks, alternatives and complications were also discussed. The patient understands and wishes to proceed with the procedure. Written consent was obtained.  Ultrasound was performed to localize and mark an adequate pocket of fluid in the right lower quadrant of the abdomen. The area was then prepped and draped in the normal sterile fashion. 1% Lidocaine was used for local anesthesia. Under ultrasound guidance Safe-T-Centesis catheter was introduced. Paracentesis was performed. The catheter was removed and a dressing applied.  COMPLICATIONS: None  FINDINGS: A total of approximately 2.5 of yellow clear fluid was removed.  IMPRESSION: Status post US guided right lower quadrant paracentesis.   Signed,  Dulcy Fanny. Earleen Newport, DO  [facility]  [facility]   Electronically Signed   By: Corrie Mckusick D.O.   On: 10/23/2014 15:08   US Paracentesis  10/13/2014   INDICATION: Patient with history of adenocarcinoma of possible GYN primary, omental thickening, ascites, malignant pleural effusion; request is made for diagnostic and therapeutic paracentesis.  EXAM: ULTRASOUND-GUIDED DIAGNOSTIC AND THERAPEUTIC PARACENTESIS  COMPARISON:  None.  MEDICATIONS: None.  COMPLICATIONS: None immediate  TECHNIQUE: Informed written consent was obtained from the patient after a discussion of the risks, benefits and alternatives to treatment. A timeout was performed prior to the initiation of the procedure.  Initial ultrasound scanning demonstrates a small to moderate amount of ascites within the right lower abdominal quadrant. The right lower abdomen was prepped and draped in the usual sterile fashion. 1% lidocaine was used for local anesthesia. Under direct ultrasound guidance, a 19 gauge, 10-cm, Yueh catheter was introduced. An ultrasound image was saved for documentation purposed. The paracentesis was performed. The catheter was removed and a dressing was applied. The patient tolerated the procedure well without immediate post procedural complication.  FINDINGS: A total of approximately 2.6 liters of amber fluid was removed. Samples were sent to the laboratory as requested by the clinical team.  IMPRESSION: Successful ultrasound-guided diagnostic and therapeutic paracentesis yielding 2.6 liters of peritoneal fluid.  Read by: Rowe Robert, PA-C   Electronically Signed   By: Jerilynn Mages.  Shick M.D.   On: 10/13/2014 16:51   Dg Abd Acute W/chest  10/31/2014   CLINICAL DATA:  Ovarian cancer. Nausea and vomiting. Acute nausea and vomiting with onset of symptoms at 6 a.m. Chronic shortness of breath.  EXAM: DG ABDOMEN ACUTE W/ 1V CHEST  COMPARISON:  10/30/2014.  FINDINGS: Small bilateral pleural  effusions are present. There is no pneumothorax. The cardiopericardial silhouette is mildly enlarged. Healed fracture of the proximal RIGHT humerus. Monitoring leads project over the chest.  Aortic arch atherosclerosis. Calcified gallstone incidentally noted in the RIGHT upper quadrant. The bowel gas pattern is normal. No free air. Stool and bowel gas extends to the rectosigmoid.  IMPRESSION: 1. Normal bowel gas pattern. 2. Cholelithiasis. 3. Small bilateral pleural effusions with associated atelectasis. No pneumothorax status post recent thoracentesis.   Electronically Signed   By: Dereck Ligas M.D.   On: 10/31/2014 13:10   US Thoracentesis Asp Pleural Space  W/img Guide  10/30/2014   INDICATION: Symptomatic left sided pleural effusion.  EXAM: US THORACENTESIS ASP PLEURAL SPACE W/IMG GUIDE  COMPARISON:  Right thoracentesis 10/21/14.  MEDICATIONS: None  COMPLICATIONS: None immediate  TECHNIQUE: Informed written consent was obtained from the patient after a discussion of the risks, benefits and alternatives to treatment. A timeout was performed prior to the initiation of the procedure.  Initial ultrasound scanning demonstrates a small left pleural effusion. The lower chest was prepped and draped in the usual sterile fashion. 1% lidocaine was used for local anesthesia.  Under direct ultrasound guidance, a 19 gauge, 7-cm, Yueh catheter was introduced. An ultrasound image was saved for documentation purposes. The thoracentesis was performed. The catheter was removed and a dressing was applied. The patient tolerated the procedure well without immediate post procedural complication. The patient was escorted to have an upright chest radiograph.  FINDINGS: A total of approximately 400 ml of serous fluid was removed.  IMPRESSION: Successful ultrasound-guided left sided thoracentesis yielding 400 ml of pleural fluid.  Read By:  Tsosie Billing PA-C   Electronically Signed   By: Markus Daft M.D.   On: 10/30/2014 16:13   US  Thoracentesis Asp Pleural Space W/img Guide  10/21/2014   INDICATION: Symptomatic bilateral pleural effusions, right greater than left.  EXAM: US THORACENTESIS ASP PLEURAL SPACE W/IMG GUIDE  COMPARISON:  CXR 10/09/14.  MEDICATIONS: None  COMPLICATIONS: None immediate  TECHNIQUE: Informed written consent was obtained from the patient after a discussion of the risks, benefits and alternatives to treatment. A timeout was performed prior to the initiation of the procedure.  Initial ultrasound scanning demonstrates a right pleural effusion. The lower chest was prepped and draped in the usual sterile fashion. 1% lidocaine was used for local anesthesia.  Under direct ultrasound guidance, a 19 gauge, 7-cm, Yueh catheter was introduced. An ultrasound image was saved for documentation purposes. The thoracentesis was performed. The catheter was removed and a dressing was applied. The patient tolerated the procedure well without immediate post procedural complication. The patient was escorted to have an upright chest radiograph.  FINDINGS: A total of approximately 750 ml of serous fluid was removed.  IMPRESSION: Successful ultrasound-guided right sided thoracentesis yielding 750 ml of pleural fluid.  Read By:  Tsosie Billing PA-C   Electronically Signed   By: Aletta Edouard M.D.   On: 10/21/2014 13:46   US Thoracentesis Asp Pleural Space W/img Guide  10/09/2014   INDICATION: COPD, dyspnea, prior smoker, recurrent malignant right pleural effusion ; request is made for therapeutic right thoracentesis .  EXAM: ULTRASOUND GUIDED THERAPEUTIC RIGHT THORACENTESIS  COMPARISON:  Prior thoracentesis on 09/30/2014  MEDICATIONS: None  COMPLICATIONS: None immediate  TECHNIQUE: Informed written consent was obtained from the patient after a discussion of the risks, benefits and alternatives to treatment. A timeout was performed prior to the initiation of the procedure.  Initial ultrasound scanning demonstrates a large right pleural effusion.  The lower chest was prepped and draped in the usual sterile fashion. 1% lidocaine was used for local anesthesia.  An ultrasound image was saved for documentation purposes. A 6 Fr Safe-T-Centesis catheter was introduced. The thoracentesis was performed. The catheter was removed and a dressing was applied. The patient tolerated the procedure well without immediate post procedural complication. The patient was escorted to have an upright chest radiograph.  FINDINGS: A total of approximately 1.8 liters of bloody fluid was removed.  IMPRESSION: Successful ultrasound-guided therapeutic right sided thoracentesis yielding 1.8 liters of pleural fluid.  Read by: Rowe Robert, PA-C   Electronically Signed   By: Jerilynn Mages.  Shick M.D.   On: 10/09/2014 14:42    Labs:  CBC:  Recent Labs  10/14/14 1142 10/31/14 1153 11/02/14 0930  WBC 9.7 8.1 3.8*  HGB 12.9 12.1 11.8*  HCT 40.3 38.8 37.3  PLT 416* 445* 346    COAGS:  Recent Labs  10/08/14 1656  INR 1.1*  APTT 27.0    BMP:  Recent Labs  10/06/14 1123 10/14/14 1142 10/31/14 1153  NA 135 138 135  K 4.2 3.6 3.9  CL 92*  --  92*  CO2 41* 37* 35*  GLUCOSE 107* 119 117*  BUN 24* 16.8 29*  CALCIUM 9.6 9.9 9.7  CREATININE 0.95 1.0 0.87  GFRNONAA  --   --  65*  GFRAA  --   --  76*    LIVER FUNCTION TESTS:  Recent Labs  10/14/14 1142 10/31/14 1153  BILITOT 0.31 0.8  AST 17 29  ALT 23 31  ALKPHOS 65 52  PROT 6.0* 6.3  ALBUMIN 2.0* 2.6*    TUMOR MARKERS: No results for input(s): AFPTM, CEA, CA199, CHROMGRNA in the last 8760 hours.  Assessment and Plan: Taffany Heiser is a 74 y.o. female with recently diagnosed metastatic adenocarcinoma (probable primary peritoneal carcinoma)with malignant right pleural effusion, ascites and omental/mesenteric thickening. She presents today for CT-guided omental thickening biopsy, possible paracentesis as well as Port-A-Cath placement for chemotherapy. Details/risks of procedures, including but not  limited to, internal bleeding, infection, injury to adjacent organs, venous thrombosis, discussed with patient and husband with their understanding and consent.     Signed: Autumn Messing 11/02/2014, 10:05 AM  I spent a total of 20 minutes face to face in clinical consultation, greater than 50% of which was counseling/coordinating care for CT-guided omental mass biopsy, possible paracentesis, Port-A-Cath placement

## 2014-11-06 ENCOUNTER — Other Ambulatory Visit (HOSPITAL_BASED_OUTPATIENT_CLINIC_OR_DEPARTMENT_OTHER): Payer: Medicare Other

## 2014-11-06 DIAGNOSIS — C569 Malignant neoplasm of unspecified ovary: Secondary | ICD-10-CM

## 2014-11-06 DIAGNOSIS — C482 Malignant neoplasm of peritoneum, unspecified: Secondary | ICD-10-CM

## 2014-11-06 DIAGNOSIS — C799 Secondary malignant neoplasm of unspecified site: Secondary | ICD-10-CM

## 2014-11-06 LAB — CBC WITH DIFFERENTIAL/PLATELET
BASO%: 0.4 % (ref 0.0–2.0)
BASOS ABS: 0 10*3/uL (ref 0.0–0.1)
EOS%: 3.1 % (ref 0.0–7.0)
Eosinophils Absolute: 0.1 10*3/uL (ref 0.0–0.5)
HEMATOCRIT: 33.2 % — AB (ref 34.8–46.6)
HGB: 10.6 g/dL — ABNORMAL LOW (ref 11.6–15.9)
LYMPH#: 0.7 10*3/uL — AB (ref 0.9–3.3)
LYMPH%: 26.4 % (ref 14.0–49.7)
MCH: 28 pg (ref 25.1–34.0)
MCHC: 31.9 g/dL (ref 31.5–36.0)
MCV: 87.7 fL (ref 79.5–101.0)
MONO#: 0.2 10*3/uL (ref 0.1–0.9)
MONO%: 8.3 % (ref 0.0–14.0)
NEUT#: 1.6 10*3/uL (ref 1.5–6.5)
NEUT%: 61.8 % (ref 38.4–76.8)
PLATELETS: 202 10*3/uL (ref 145–400)
RBC: 3.79 10*6/uL (ref 3.70–5.45)
RDW: 15.1 % — AB (ref 11.2–14.5)
WBC: 2.7 10*3/uL — ABNORMAL LOW (ref 3.9–10.3)

## 2014-11-10 ENCOUNTER — Telehealth: Payer: Self-pay | Admitting: Gynecologic Oncology

## 2014-11-10 NOTE — Telephone Encounter (Signed)
Patient informed of biopsy results.  Dr. Denman George is aware and recommends continuing with current plan and chemotherapy.  She reports doing well since her first chemo and is able to wear her clothes since the fluid seems to be stable.  Advised to call for any questions or concerns.

## 2014-11-14 ENCOUNTER — Other Ambulatory Visit: Payer: Self-pay | Admitting: Oncology

## 2014-11-18 ENCOUNTER — Ambulatory Visit (HOSPITAL_BASED_OUTPATIENT_CLINIC_OR_DEPARTMENT_OTHER): Payer: Medicare Other | Admitting: Oncology

## 2014-11-18 ENCOUNTER — Ambulatory Visit (HOSPITAL_BASED_OUTPATIENT_CLINIC_OR_DEPARTMENT_OTHER): Payer: Medicare Other

## 2014-11-18 ENCOUNTER — Other Ambulatory Visit (HOSPITAL_BASED_OUTPATIENT_CLINIC_OR_DEPARTMENT_OTHER): Payer: Medicare Other

## 2014-11-18 ENCOUNTER — Telehealth: Payer: Self-pay | Admitting: Oncology

## 2014-11-18 VITALS — BP 150/65 | HR 96 | Temp 97.8°F | Resp 18 | Ht 60.0 in | Wt 174.4 lb

## 2014-11-18 DIAGNOSIS — C799 Secondary malignant neoplasm of unspecified site: Secondary | ICD-10-CM

## 2014-11-18 DIAGNOSIS — C482 Malignant neoplasm of peritoneum, unspecified: Secondary | ICD-10-CM

## 2014-11-18 DIAGNOSIS — Z95828 Presence of other vascular implants and grafts: Secondary | ICD-10-CM

## 2014-11-18 DIAGNOSIS — Z5111 Encounter for antineoplastic chemotherapy: Secondary | ICD-10-CM | POA: Diagnosis present

## 2014-11-18 DIAGNOSIS — C801 Malignant (primary) neoplasm, unspecified: Secondary | ICD-10-CM | POA: Diagnosis not present

## 2014-11-18 DIAGNOSIS — Z452 Encounter for adjustment and management of vascular access device: Secondary | ICD-10-CM

## 2014-11-18 DIAGNOSIS — C569 Malignant neoplasm of unspecified ovary: Secondary | ICD-10-CM | POA: Diagnosis not present

## 2014-11-18 DIAGNOSIS — J910 Malignant pleural effusion: Secondary | ICD-10-CM

## 2014-11-18 LAB — CBC WITH DIFFERENTIAL/PLATELET
BASO%: 0.4 % (ref 0.0–2.0)
Basophils Absolute: 0 10*3/uL (ref 0.0–0.1)
EOS ABS: 0.1 10*3/uL (ref 0.0–0.5)
EOS%: 2.3 % (ref 0.0–7.0)
HCT: 31.7 % — ABNORMAL LOW (ref 34.8–46.6)
HEMOGLOBIN: 10 g/dL — AB (ref 11.6–15.9)
LYMPH%: 27.4 % (ref 14.0–49.7)
MCH: 29.2 pg (ref 25.1–34.0)
MCHC: 31.5 g/dL (ref 31.5–36.0)
MCV: 92.7 fL (ref 79.5–101.0)
MONO#: 0.4 10*3/uL (ref 0.1–0.9)
MONO%: 8.7 % (ref 0.0–14.0)
NEUT#: 2.9 10*3/uL (ref 1.5–6.5)
NEUT%: 61.2 % (ref 38.4–76.8)
Platelets: 253 10*3/uL (ref 145–400)
RBC: 3.42 10*6/uL — AB (ref 3.70–5.45)
RDW: 17.4 % — AB (ref 11.2–14.5)
WBC: 4.8 10*3/uL (ref 3.9–10.3)
lymph#: 1.3 10*3/uL (ref 0.9–3.3)

## 2014-11-18 LAB — COMPREHENSIVE METABOLIC PANEL (CC13)
ALT: 18 U/L (ref 0–55)
ANION GAP: 9 meq/L (ref 3–11)
AST: 18 U/L (ref 5–34)
Albumin: 2.9 g/dL — ABNORMAL LOW (ref 3.5–5.0)
Alkaline Phosphatase: 71 U/L (ref 40–150)
BUN: 19.9 mg/dL (ref 7.0–26.0)
CALCIUM: 9.9 mg/dL (ref 8.4–10.4)
CO2: 34 mEq/L — ABNORMAL HIGH (ref 22–29)
Chloride: 99 mEq/L (ref 98–109)
Creatinine: 0.9 mg/dL (ref 0.6–1.1)
EGFR: 65 mL/min/{1.73_m2} — ABNORMAL LOW (ref >90)
GLUCOSE: 110 mg/dL (ref 70–140)
Potassium: 3.9 mEq/L (ref 3.5–5.1)
Sodium: 143 mEq/L (ref 136–145)
TOTAL PROTEIN: 6.7 g/dL (ref 6.4–8.3)
Total Bilirubin: 0.27 mg/dL (ref 0.20–1.20)

## 2014-11-18 MED ORDER — PACLITAXEL CHEMO INJECTION 300 MG/50ML
175.0000 mg/m2 | Freq: Once | INTRAVENOUS | Status: AC
  Administered 2014-11-18: 330 mg via INTRAVENOUS
  Filled 2014-11-18: qty 55

## 2014-11-18 MED ORDER — SODIUM CHLORIDE 0.9 % IJ SOLN
10.0000 mL | INTRAMUSCULAR | Status: DC | PRN
  Administered 2014-11-18: 10 mL via INTRAVENOUS
  Filled 2014-11-18: qty 10

## 2014-11-18 MED ORDER — FAMOTIDINE IN NACL 20-0.9 MG/50ML-% IV SOLN
20.0000 mg | Freq: Once | INTRAVENOUS | Status: AC
  Administered 2014-11-18: 20 mg via INTRAVENOUS

## 2014-11-18 MED ORDER — LORAZEPAM 0.5 MG PO TABS
0.2500 mg | ORAL_TABLET | Freq: Once | ORAL | Status: AC
  Administered 2014-11-18: 0.25 mg via ORAL
  Filled 2014-11-18: qty 0.5

## 2014-11-18 MED ORDER — FAMOTIDINE IN NACL 20-0.9 MG/50ML-% IV SOLN
INTRAVENOUS | Status: AC
  Filled 2014-11-18: qty 50

## 2014-11-18 MED ORDER — SODIUM CHLORIDE 0.9 % IV SOLN
462.5000 mg | Freq: Once | INTRAVENOUS | Status: AC
  Administered 2014-11-18: 460 mg via INTRAVENOUS
  Filled 2014-11-18: qty 46

## 2014-11-18 MED ORDER — DIPHENHYDRAMINE HCL 50 MG/ML IJ SOLN
INTRAMUSCULAR | Status: AC
  Filled 2014-11-18: qty 1

## 2014-11-18 MED ORDER — FOSAPREPITANT DIMEGLUMINE INJECTION 150 MG
Freq: Once | INTRAVENOUS | Status: AC
  Administered 2014-11-18: 11:00:00 via INTRAVENOUS
  Filled 2014-11-18: qty 5

## 2014-11-18 MED ORDER — LORAZEPAM 1 MG PO TABS
ORAL_TABLET | ORAL | Status: AC
  Filled 2014-11-18: qty 1

## 2014-11-18 MED ORDER — SODIUM CHLORIDE 0.9 % IV SOLN: Freq: Once | INTRAVENOUS | Status: DC

## 2014-11-18 MED ORDER — DIPHENHYDRAMINE HCL 50 MG/ML IJ SOLN
50.0000 mg | Freq: Once | INTRAMUSCULAR | Status: AC
  Administered 2014-11-18: 50 mg via INTRAVENOUS

## 2014-11-18 MED ORDER — PALONOSETRON HCL INJECTION 0.25 MG/5ML
INTRAVENOUS | Status: AC
  Filled 2014-11-18: qty 5

## 2014-11-18 MED ORDER — PALONOSETRON HCL INJECTION 0.25 MG/5ML
0.2500 mg | Freq: Once | INTRAVENOUS | Status: AC
Start: 2014-11-18 — End: 2014-11-18
  Administered 2014-11-18: 0.25 mg via INTRAVENOUS

## 2014-11-18 NOTE — Telephone Encounter (Signed)
Gave and printed appt sched and avs for pt for May and JUNE °

## 2014-11-18 NOTE — Progress Notes (Signed)
  [facility] OFFICE PROGRESS NOTE   Diagnosis: Peritoneal carcinoma  INTERVAL HISTORY:   Ms. Schnackenberg returns as scheduled. She completed a first cycle of Taxol/carboplatin on 10/28/2014. She underwent placement of a Port-A-Cath on 11/02/2014. She had a left thoracentesis for 400 mL of fluid on 10/30/2014. On 11/02/2014 she underwent a paracentesis for 2 L followed by a CT-guided biopsy of an omental mass. The pathology from the omental mass confirmed invasive serous carcinoma.  She had no symptoms of an allergic reaction or bone pain following chemotherapy. She reports transient numbness in the fingers. She developed nausea and vomiting on day 3. The nausea lasted until day 4. She presented emergency room 10/31/2014. She was treated with intravenous fluids and Zofran.  She feels much better in general.   Objective:  Vital signs in last 24 hours:  Blood pressure 150/65, pulse 96, temperature 97.8 F (36.6 C), temperature source Oral, resp. rate 18, height 5' (1.524 m), weight 174 lb 6.4 oz (79.107 kg), SpO2 93 %.    HEENT: The pharynx is dry, no thrush or ulcers Resp: Distant breath sounds with decreased breath sounds at the lower chest bilaterally, no respiratory distress Cardio: Regular rate and rhythm GI: The abdomen is mildly distended, no mass, no apparent ascites Vascular: No leg edema  Portacath/PICC-without erythema  Lab Results:  Lab Results  Component Value Date   WBC 4.8 11/18/2014   HGB 10.0* 11/18/2014   HCT 31.7* 11/18/2014   MCV 92.7 11/18/2014   PLT 253 11/18/2014   NEUTROABS 2.9 11/18/2014    Medications: I have reviewed the patient's current medications.  Assessment/Plan: 1. 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, immunohistochemical profile consistent with a GYN primary, elevated CA 125  Staging CTs of the chest, abdomen, and pelvis on 10/06/2014 revealed a loculated  right pleural effusion, ascites, and omental/mesenteric thickening  Cytology from peritoneal fluid 10/13/2014 revealed malignant cells consistent with metastatic adenocarcinoma  Biopsy of an omental mass on 11/02/2014 revealed invasive serous carcinoma with psammoma bodies  Cycle 1 Taxol/carboplatin 10/28/2014 2. COPD 3. Dyspnea secondary to COPD and the large right pleural effusion, status post therapeutic thoracentesis procedures 09/30/2014,10/09/2014, and 10/21/2014. Left thoracentesis 10/30/2014 4. Left nephrectomy as a child 5. Delayed nausea following cycle 1 Taxol/carboplatin, Aloxi/anemia and added with cycle 2   Disposition:  Mr. Lavergne completed one cycle of Taxol/carboplatin on 10/28/2014. The chemotherapy was complicated by delayed nausea. She otherwise tolerated chemotherapy well. The plan is to proceed with cycle 2 today. The antiemetic regimen will be adjusted. She will return for an office visit and cycle 3 in 3 weeks.  She will contact us for nausea following this cycle of chemotherapy. She will also contact us for increased dyspnea.  Betsy Coder, MD  11/18/2014  9:48 AM

## 2014-11-18 NOTE — Patient Instructions (Signed)

## 2014-11-18 NOTE — Patient Instructions (Signed)
[facility] Discharge Instructions for Patients Receiving Chemotherapy  Today you received the following chemotherapy agents Taxol and Carboplatin  To help prevent nausea and vomiting after your treatment, we encourage you to take your nausea medication     If you develop nausea and vomiting that is not controlled by your nausea medication, call the clinic.   BELOW ARE SYMPTOMS THAT SHOULD BE REPORTED IMMEDIATELY:  *FEVER GREATER THAN 100.5 F  *CHILLS WITH OR WITHOUT FEVER  NAUSEA AND VOMITING THAT IS NOT CONTROLLED WITH YOUR NAUSEA MEDICATION  *UNUSUAL SHORTNESS OF BREATH  *UNUSUAL BRUISING OR BLEEDING  TENDERNESS IN MOUTH AND THROAT WITH OR WITHOUT PRESENCE OF ULCERS  *URINARY PROBLEMS  *BOWEL PROBLEMS  UNUSUAL RASH Items with * indicate a potential emergency and should be followed up as soon as possible.  Feel free to call the clinic you have any questions or concerns. The clinic phone number is [phone].  Please show the CHEMO ALERT CARD at check-in to the Emergency Department and triage nurse.   

## 2014-11-19 LAB — CA 125: CA 125: 1353 U/mL — ABNORMAL HIGH (ref <35)

## 2014-11-20 ENCOUNTER — Ambulatory Visit: Payer: Medicare Other | Admitting: Pulmonary Disease

## 2014-11-27 ENCOUNTER — Encounter: Payer: Self-pay | Admitting: Pulmonary Disease

## 2014-11-27 ENCOUNTER — Ambulatory Visit (INDEPENDENT_AMBULATORY_CARE_PROVIDER_SITE_OTHER): Payer: Medicare Other | Admitting: Pulmonary Disease

## 2014-11-27 VITALS — BP 142/76 | HR 104 | Temp 98.1°F | Ht 60.0 in | Wt 168.0 lb

## 2014-11-27 DIAGNOSIS — J438 Other emphysema: Secondary | ICD-10-CM

## 2014-11-27 NOTE — Patient Instructions (Signed)
No change in breathing medications Will check your oxygen level with exertion, and if ok, will stop oxygen followup with Dr. Lamonte Sakai in 40mos to check on things, and then can spread out visits again.

## 2014-11-27 NOTE — Progress Notes (Signed)
   Subjective:    Patient ID: Lynn Ramirez, female    DOB: 1941-05-30, 74 y.o.   MRN: [id number]  HPI Patient comes in today for follow-up of her known COPD. She's been having issues with worsening dyspnea related to recurrent pleural effusions from her serous carcinoma that is felt to be of ovarian etiology?  She has received chemotherapy, and feels that her breathing is much better. She feels that she has returned to her usual baseline, and that she no longer needs her oxygen.   Review of Systems  Constitutional: Negative for fever and unexpected weight change.  HENT: Negative for congestion, dental problem, ear pain, nosebleeds, postnasal drip, rhinorrhea, sinus pressure, sneezing, sore throat and trouble swallowing.   Eyes: Negative for redness and itching.  Respiratory: Negative for cough, chest tightness, shortness of breath and wheezing.   Cardiovascular: Negative for palpitations and leg swelling.  Gastrointestinal: Negative for nausea and vomiting.  Genitourinary: Negative for dysuria.  Musculoskeletal: Negative for joint swelling.  Skin: Negative for rash.  Neurological: Negative for headaches.  Hematological: Does not bruise/bleed easily.  Psychiatric/Behavioral: Negative for dysphoric mood. The patient is not nervous/anxious.        Objective:   Physical Exam Well-developed female in no acute distress Nose without purulence or discharge noted Neck without lymphadenopathy or thyromegaly Chest with fairly clear breath sounds, no wheezing. No pleural effusion by percussion Cardiac exam with regular rate and rhythm, 2/6 systolic murmur Lower extremities without edema, no cyanosis Alert and oriented, moves all 4 extremities.       Assessment & Plan:

## 2014-11-27 NOTE — Assessment & Plan Note (Signed)
The patient is currently doing very well from a breathing standpoint, with no issues with respect to her COPD. She was having recurrent pleural effusions related to her serous peritoneal carcinoma, but has greatly improved since being on chemotherapy. There are no effusions by auscultation and percussion today on exam. The patient was started on oxygen while she was having issues with her pleural effusions, and currently has excellent saturations on room air. Will do ambulatory oximetry, and if adequate, will discontinue her oxygen. I've asked her to stay on her current bronchodilators, and to keep working on conditioning.

## 2014-12-01 ENCOUNTER — Telehealth: Payer: Self-pay | Admitting: Pulmonary Disease

## 2014-12-01 NOTE — Telephone Encounter (Signed)
refaxed order to [facility] pt aware Lynn Ramirez

## 2014-12-01 NOTE — Telephone Encounter (Signed)
Order was placed 11/27/14. Please advise [facility]'s thanks

## 2014-12-02 ENCOUNTER — Telehealth: Payer: Self-pay | Admitting: Gynecologic Oncology

## 2014-12-02 NOTE — Telephone Encounter (Signed)
Returned call to patient.  Message left about surgical date of June 21 being held for her and the need for CT imaging after completion of the 3rd cycle.  Informed to call for any further questions or concerns.

## 2014-12-06 ENCOUNTER — Other Ambulatory Visit: Payer: Self-pay | Admitting: Oncology

## 2014-12-09 ENCOUNTER — Ambulatory Visit (HOSPITAL_BASED_OUTPATIENT_CLINIC_OR_DEPARTMENT_OTHER): Payer: Medicare Other | Admitting: Nurse Practitioner

## 2014-12-09 ENCOUNTER — Ambulatory Visit (HOSPITAL_BASED_OUTPATIENT_CLINIC_OR_DEPARTMENT_OTHER): Payer: Medicare Other

## 2014-12-09 ENCOUNTER — Ambulatory Visit: Payer: Medicare Other

## 2014-12-09 ENCOUNTER — Other Ambulatory Visit (HOSPITAL_BASED_OUTPATIENT_CLINIC_OR_DEPARTMENT_OTHER): Payer: Medicare Other

## 2014-12-09 ENCOUNTER — Ambulatory Visit (HOSPITAL_COMMUNITY)
Admission: RE | Admit: 2014-12-09 | Discharge: 2014-12-09 | Disposition: A | Discharge status: Home / Self Care | Payer: Medicare Other | Source: Ambulatory Visit | Attending: Nurse Practitioner | Admitting: Nurse Practitioner

## 2014-12-09 VITALS — BP 138/62 | HR 100 | Temp 97.8°F | Resp 18 | Ht 60.0 in | Wt 173.4 lb

## 2014-12-09 DIAGNOSIS — C482 Malignant neoplasm of peritoneum, unspecified: Secondary | ICD-10-CM | POA: Diagnosis not present

## 2014-12-09 DIAGNOSIS — M79604 Pain in right leg: Secondary | ICD-10-CM | POA: Diagnosis not present

## 2014-12-09 DIAGNOSIS — M79601 Pain in right arm: Secondary | ICD-10-CM

## 2014-12-09 DIAGNOSIS — M7989 Other specified soft tissue disorders: Secondary | ICD-10-CM | POA: Insufficient documentation

## 2014-12-09 DIAGNOSIS — R609 Edema, unspecified: Secondary | ICD-10-CM | POA: Diagnosis not present

## 2014-12-09 DIAGNOSIS — J910 Malignant pleural effusion: Secondary | ICD-10-CM

## 2014-12-09 DIAGNOSIS — Z95828 Presence of other vascular implants and grafts: Secondary | ICD-10-CM

## 2014-12-09 DIAGNOSIS — Z5111 Encounter for antineoplastic chemotherapy: Secondary | ICD-10-CM

## 2014-12-09 DIAGNOSIS — C799 Secondary malignant neoplasm of unspecified site: Secondary | ICD-10-CM

## 2014-12-09 DIAGNOSIS — C786 Secondary malignant neoplasm of retroperitoneum and peritoneum: Secondary | ICD-10-CM

## 2014-12-09 LAB — CBC WITH DIFFERENTIAL/PLATELET
BASO%: 0.4 % (ref 0.0–2.0)
BASOS ABS: 0 10*3/uL (ref 0.0–0.1)
EOS ABS: 0.1 10*3/uL (ref 0.0–0.5)
EOS%: 1.4 % (ref 0.0–7.0)
HCT: 28.6 % — ABNORMAL LOW (ref 34.8–46.6)
HEMOGLOBIN: 9 g/dL — AB (ref 11.6–15.9)
LYMPH%: 29 % (ref 14.0–49.7)
MCH: 30.4 pg (ref 25.1–34.0)
MCHC: 31.5 g/dL (ref 31.5–36.0)
MCV: 96.6 fL (ref 79.5–101.0)
MONO#: 0.5 10*3/uL (ref 0.1–0.9)
MONO%: 10.5 % (ref 0.0–14.0)
NEUT#: 3 10*3/uL (ref 1.5–6.5)
NEUT%: 58.7 % (ref 38.4–76.8)
PLATELETS: 234 10*3/uL (ref 145–400)
RBC: 2.96 10*6/uL — AB (ref 3.70–5.45)
RDW: 21.5 % — ABNORMAL HIGH (ref 11.2–14.5)
WBC: 5 10*3/uL (ref 3.9–10.3)
lymph#: 1.5 10*3/uL (ref 0.9–3.3)

## 2014-12-09 LAB — COMPREHENSIVE METABOLIC PANEL (CC13)
ALT: 15 U/L (ref 0–55)
ANION GAP: 10 meq/L (ref 3–11)
AST: 20 U/L (ref 5–34)
Albumin: 3 g/dL — ABNORMAL LOW (ref 3.5–5.0)
Alkaline Phosphatase: 74 U/L (ref 40–150)
BUN: 18.5 mg/dL (ref 7.0–26.0)
CO2: 30 mEq/L — ABNORMAL HIGH (ref 22–29)
Calcium: 9.9 mg/dL (ref 8.4–10.4)
Chloride: 100 mEq/L (ref 98–109)
Creatinine: 0.9 mg/dL (ref 0.6–1.1)
EGFR: 66 mL/min/{1.73_m2} — ABNORMAL LOW (ref >90)
Glucose: 124 mg/dl (ref 70–140)
Potassium: 3.9 mEq/L (ref 3.5–5.1)
Sodium: 141 mEq/L (ref 136–145)
TOTAL PROTEIN: 6.9 g/dL (ref 6.4–8.3)

## 2014-12-09 MED ORDER — SODIUM CHLORIDE 0.9 % IJ SOLN
10.0000 mL | INTRAMUSCULAR | Status: DC | PRN
  Administered 2014-12-09: 10 mL via INTRAVENOUS
  Filled 2014-12-09: qty 10

## 2014-12-09 MED ORDER — PALONOSETRON HCL INJECTION 0.25 MG/5ML
0.2500 mg | Freq: Once | INTRAVENOUS | Status: AC
  Administered 2014-12-09: 0.25 mg via INTRAVENOUS

## 2014-12-09 MED ORDER — DIPHENHYDRAMINE HCL 50 MG/ML IJ SOLN
25.0000 mg | Freq: Once | INTRAMUSCULAR | Status: AC
  Administered 2014-12-09: 25 mg via INTRAVENOUS

## 2014-12-09 MED ORDER — CARBOPLATIN CHEMO INJECTION 600 MG/60ML
462.5000 mg | Freq: Once | INTRAVENOUS | Status: AC
  Administered 2014-12-09: 460 mg via INTRAVENOUS
  Filled 2014-12-09: qty 46

## 2014-12-09 MED ORDER — PALONOSETRON HCL INJECTION 0.25 MG/5ML
INTRAVENOUS | Status: AC
  Filled 2014-12-09: qty 5

## 2014-12-09 MED ORDER — HEPARIN SOD (PORK) LOCK FLUSH 100 UNIT/ML IV SOLN
500.0000 [IU] | Freq: Once | INTRAVENOUS | Status: AC | PRN
  Administered 2014-12-09: 500 [IU]
  Filled 2014-12-09: qty 5

## 2014-12-09 MED ORDER — FAMOTIDINE IN NACL 20-0.9 MG/50ML-% IV SOLN
20.0000 mg | Freq: Once | INTRAVENOUS | Status: AC
  Administered 2014-12-09: 20 mg via INTRAVENOUS

## 2014-12-09 MED ORDER — SODIUM CHLORIDE 0.9 % IJ SOLN
10.0000 mL | INTRAMUSCULAR | Status: DC | PRN
  Administered 2014-12-09: 10 mL
  Filled 2014-12-09: qty 10

## 2014-12-09 MED ORDER — FAMOTIDINE IN NACL 20-0.9 MG/50ML-% IV SOLN
INTRAVENOUS | Status: AC
  Filled 2014-12-09: qty 50

## 2014-12-09 MED ORDER — DIPHENHYDRAMINE HCL 50 MG/ML IJ SOLN
INTRAMUSCULAR | Status: AC
  Filled 2014-12-09: qty 1

## 2014-12-09 MED ORDER — PACLITAXEL CHEMO INJECTION 300 MG/50ML
175.0000 mg/m2 | Freq: Once | INTRAVENOUS | Status: AC
  Administered 2014-12-09: 330 mg via INTRAVENOUS
  Filled 2014-12-09: qty 55

## 2014-12-09 MED ORDER — SODIUM CHLORIDE 0.9 % IV SOLN
Freq: Once | INTRAVENOUS | Status: AC
  Administered 2014-12-09: 11:00:00 via INTRAVENOUS
  Filled 2014-12-09: qty 5

## 2014-12-09 MED ORDER — LORAZEPAM 2 MG/ML IJ SOLN
INTRAMUSCULAR | Status: AC
  Filled 2014-12-09: qty 1

## 2014-12-09 MED ORDER — SODIUM CHLORIDE 0.9 % IV SOLN
Freq: Once | INTRAVENOUS | Status: AC
  Administered 2014-12-09: 11:00:00 via INTRAVENOUS

## 2014-12-09 MED ORDER — LORAZEPAM 2 MG/ML IJ SOLN
0.5000 mg | Freq: Once | INTRAMUSCULAR | Status: AC
  Administered 2014-12-09: 0.5 mg via INTRAVENOUS

## 2014-12-09 NOTE — Patient Instructions (Signed)

## 2014-12-09 NOTE — Progress Notes (Addendum)
  [facility] OFFICE PROGRESS NOTE   Diagnosis:  Peritoneal carcinoma  INTERVAL HISTORY:   Lynn Ramirez returns as scheduled. She completed cycle 2 Taxol/carboplatin 11/18/2014. She had minimal nausea. No vomiting. No mouth sores. No diarrhea or constipation. She denies abdominal pain and distention. No rash. She intermittently notes mild numbness in the fingertips and toes. She notes increased fatigue. Stable mild dyspnea on exertion. She reports recent intermittent lower leg swelling.  Objective:  Vital signs in last 24 hours:  Blood pressure 138/62, pulse 100, temperature 97.8 F (36.6 C), temperature source Oral, resp. rate 18, height 5' (1.524 m), weight 173 lb 6.4 oz (78.654 kg), SpO2 96 %.    HEENT: No thrush or ulcers. Resp: Lungs clear bilaterally. Cardio: Regular rate and rhythm. GI: Abdomen is soft and nontender. No hepatomegaly. No mass. No apparent ascites. Vascular: Trace lower leg bilaterally right slightly greater than left. Right calf tenderness. Neuro: Vibratory sense mildly decreased over the fingertips per tuning fork exam.  Port-A-Cath without erythema.    Lab Results:  Lab Results  Component Value Date   WBC 5.0 12/09/2014   HGB 9.0* 12/09/2014   HCT 28.6* 12/09/2014   MCV 96.6 12/09/2014   PLT 234 12/09/2014   NEUTROABS 3.0 12/09/2014    Imaging:  No results found.  Medications: I have reviewed the patient's current medications.  Assessment/Plan: 1. 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, immunohistochemical profile consistent with a GYN primary, elevated CA 125  Staging CTs of the chest, abdomen, and pelvis on 10/06/2014 revealed a loculated right pleural effusion, ascites, and omental/mesenteric thickening  Cytology from peritoneal fluid 10/13/2014 revealed malignant cells consistent with metastatic adenocarcinoma  Biopsy of an omental mass on 11/02/2014 revealed  invasive serous carcinoma with psammoma bodies  Cycle 1 Taxol/carboplatin 10/28/2014  Cycle 2 Taxol/carboplatin 11/18/2014  Cycle 3 Taxol/carboplatin 12/09/2014 2. COPD 3. Dyspnea secondary to COPD and the large right pleural effusion, status post therapeutic thoracentesis procedures 09/30/2014,10/09/2014, and 10/21/2014. Left thoracentesis 10/30/2014 4. Left nephrectomy as a child 5. Delayed nausea following cycle 1 Taxol/carboplatin, Aloxi/Emend added with cycle 2 with improvement.   Disposition: Lynn Ramirez appears stable. She has completed 2 cycles of Taxol/carboplatin. Most recent CA-125 was improved. Plan to proceed with cycle 3 today as scheduled. We will obtain a restaging CT evaluation in approximately 2 weeks.  We are referring her for a venous Doppler of the right leg due to edema and right calf pain.  She will return for a follow-up visit on 12/25/2014. She will contact the office in the interim with any problems.  Patient seen with Dr. Benay Spice. 25 minutes were spent face-to-face at today's visit with the majority of that time involved in counseling/coordination of care.    Ned Card ANP/GNP-BC   12/09/2014  11:28 AM  This was a shared visit with Ned Card. Lynn Ramirez was interviewed and examined. We will check a Doppler of the right leg to rule out a DVT. Her overall status appears improved following 2 cycles of Taxol/carboplatin. The plan is to proceed with cycle 3 today. She will undergo restaging CTs after this cycle.  Julieanne Manson, M.D.

## 2014-12-09 NOTE — Progress Notes (Signed)
Right lower extremity venous duplex completed.  Right:  No evidence of DVT, superficial thrombosis, or Baker's cyst.  Left:  Negative for DVT in the common femoral vein.  

## 2014-12-09 NOTE — Progress Notes (Signed)
Pt reports restless legs. Ned Card, NP notified and orders received for 0.5 mg IV Ativan.   1215: Pt states ativan has helped relieve restless legs.

## 2014-12-09 NOTE — Patient Instructions (Signed)
[facility] Discharge Instructions for Patients Receiving Chemotherapy  Today you received the following chemotherapy agents: Taxol, Carboplatin   To help prevent nausea and vomiting after your treatment, we encourage you to take your nausea medication as directed.    If you develop nausea and vomiting that is not controlled by your nausea medication, call the clinic.   BELOW ARE SYMPTOMS THAT SHOULD BE REPORTED IMMEDIATELY:  *FEVER GREATER THAN 100.5 F  *CHILLS WITH OR WITHOUT FEVER  NAUSEA AND VOMITING THAT IS NOT CONTROLLED WITH YOUR NAUSEA MEDICATION  *UNUSUAL SHORTNESS OF BREATH  *UNUSUAL BRUISING OR BLEEDING  TENDERNESS IN MOUTH AND THROAT WITH OR WITHOUT PRESENCE OF ULCERS  *URINARY PROBLEMS  *BOWEL PROBLEMS  UNUSUAL RASH Items with * indicate a potential emergency and should be followed up as soon as possible.  Feel free to call the clinic you have any questions or concerns. The clinic phone number is [phone].  Please show the CHEMO ALERT CARD at check-in to the Emergency Department and triage nurse.   

## 2014-12-10 LAB — CA 125: CA 125: 337 U/mL — ABNORMAL HIGH (ref <35)

## 2014-12-23 ENCOUNTER — Encounter: Payer: Self-pay | Admitting: Gynecologic Oncology

## 2014-12-23 ENCOUNTER — Ambulatory Visit (HOSPITAL_BASED_OUTPATIENT_CLINIC_OR_DEPARTMENT_OTHER): Payer: Medicare Other | Admitting: Gynecologic Oncology

## 2014-12-23 ENCOUNTER — Encounter (HOSPITAL_COMMUNITY): Payer: Self-pay

## 2014-12-23 ENCOUNTER — Ambulatory Visit (HOSPITAL_COMMUNITY)
Admission: RE | Admit: 2014-12-23 | Discharge: 2014-12-23 | Disposition: A | Discharge status: Home / Self Care | Payer: Medicare Other | Source: Ambulatory Visit | Attending: Nurse Practitioner | Admitting: Nurse Practitioner

## 2014-12-23 VITALS — BP 138/59 | HR 106 | Temp 98.8°F | Resp 18 | Ht 60.0 in | Wt 174.6 lb

## 2014-12-23 DIAGNOSIS — K8020 Calculus of gallbladder without cholecystitis without obstruction: Secondary | ICD-10-CM | POA: Diagnosis not present

## 2014-12-23 DIAGNOSIS — I700 Atherosclerosis of aorta: Secondary | ICD-10-CM | POA: Diagnosis not present

## 2014-12-23 DIAGNOSIS — C801 Malignant (primary) neoplasm, unspecified: Secondary | ICD-10-CM | POA: Diagnosis not present

## 2014-12-23 DIAGNOSIS — Z905 Acquired absence of kidney: Secondary | ICD-10-CM | POA: Diagnosis not present

## 2014-12-23 DIAGNOSIS — D3502 Benign neoplasm of left adrenal gland: Secondary | ICD-10-CM | POA: Diagnosis not present

## 2014-12-23 DIAGNOSIS — J449 Chronic obstructive pulmonary disease, unspecified: Secondary | ICD-10-CM

## 2014-12-23 DIAGNOSIS — I7090 Unspecified atherosclerosis: Secondary | ICD-10-CM | POA: Diagnosis not present

## 2014-12-23 DIAGNOSIS — C786 Secondary malignant neoplasm of retroperitoneum and peritoneum: Secondary | ICD-10-CM | POA: Insufficient documentation

## 2014-12-23 DIAGNOSIS — C561 Malignant neoplasm of right ovary: Secondary | ICD-10-CM | POA: Diagnosis not present

## 2014-12-23 DIAGNOSIS — C569 Malignant neoplasm of unspecified ovary: Secondary | ICD-10-CM | POA: Diagnosis not present

## 2014-12-23 DIAGNOSIS — J910 Malignant pleural effusion: Secondary | ICD-10-CM | POA: Diagnosis not present

## 2014-12-23 DIAGNOSIS — C482 Malignant neoplasm of peritoneum, unspecified: Secondary | ICD-10-CM

## 2014-12-23 MED ORDER — IOHEXOL 300 MG/ML  SOLN
100.0000 mL | Freq: Once | INTRAMUSCULAR | Status: AC | PRN
  Administered 2014-12-23: 100 mL via INTRAVENOUS

## 2014-12-23 NOTE — Patient Instructions (Signed)
Preparing for your Surgery  Plan for surgery on June 21 with Dr. Denman George.  Pre-operative Testing -You will receive a phone call from presurgical testing at [facility] to arrange for a pre-operative testing appointment before your surgery.  This appointment normally occurs one to two weeks before your scheduled surgery.   -Bring your insurance card, copy of an advanced directive if applicable, medication list  -At that visit, you will be asked to sign a consent for a possible blood transfusion in case a transfusion becomes necessary during surgery.  The need for a blood transfusion is rare but having consent is a necessary part of your care.     -You should not be taking blood thinners or aspirin at least ten days prior to surgery unless instructed by your surgeon.  Day Before Surgery at [facility] will be asked to take in only clear liquids the day before surgery.  Examples of clear liquids include broths, jello, and clear juices.  You will need to drink a bottle of magnesium citrate (over the counter at the drug store) the day before surgery.  You will be advised to have nothing to eat or drink after midnight the evening before.    Your role in recovery Your role is to become active as soon as directed by your doctor, while still giving yourself time to heal.  Rest when you feel tired. You will be asked to do the following in order to speed your recovery:  - Cough and breathe deeply. This helps toclear and expand your lungs and can prevent pneumonia. You may be given a spirometer to practice deep breathing. A staff member will show you how to use the spirometer. - Do mild physical activity. Walking or moving your legs help your circulation and body functions return to normal. A staff member will help you when you try to walk and will provide you with simple exercises. Do not try to get up or walk alone the first time. - Actively manage your pain. Managing your pain lets you  move in comfort. We will ask you to rate your pain on a scale of zero to 10. It is your responsibility to tell your doctor or nurse where and how much you hurt so your pain can be treated.  Special Considerations -If you are diabetic, you may be placed on insulin after surgery to have closer control over your blood sugars to promote healing and recovery.  This does not mean that you will be discharged on insulin.  If applicable, your oral antidiabetics will be resumed when you are tolerating a solid diet.  -Your final pathology results from surgery should be available by the Friday after surgery and the results will be relayed to you when available.  Blood Transfusion Information WHAT IS A BLOOD TRANSFUSION? A transfusion is the replacement of blood or some of its parts. Blood is made up of multiple cells which provide different functions.  Red blood cells carry oxygen and are used for blood loss replacement.  White blood cells fight against infection.  Platelets control bleeding.  Plasma helps clot blood.  Other blood products are available for specialized needs, such as hemophilia or other clotting disorders. BEFORE THE TRANSFUSION  Who gives blood for transfusions?   You may be able to donate blood to be used at a later date on yourself (autologous donation).  Relatives can be asked to donate blood. This is generally not any safer than if you have received blood from  a stranger. The same precautions are taken to ensure safety when a relative's blood is donated.  Healthy volunteers who are fully evaluated to make sure their blood is safe. This is blood bank blood. Transfusion therapy is the safest it has ever been in the practice of medicine. Before blood is taken from a donor, a complete history is taken to make sure that person has no history of diseases nor engages in risky social behavior (examples are intravenous drug use or sexual activity with multiple partners). The donor's  travel history is screened to minimize risk of transmitting infections, such as malaria. The donated blood is tested for signs of infectious diseases, such as HIV and hepatitis. The blood is then tested to be sure it is compatible with you in order to minimize the chance of a transfusion reaction. If you or a relative donates blood, this is often done in anticipation of surgery and is not appropriate for emergency situations. It takes many days to process the donated blood. RISKS AND COMPLICATIONS Although transfusion therapy is very safe and saves many lives, the main dangers of transfusion include:   Getting an infectious disease.  Developing a transfusion reaction. This is an allergic reaction to something in the blood you were given. Every precaution is taken to prevent this. The decision to have a blood transfusion has been considered carefully by your caregiver before blood is given. Blood is not given unless the benefits outweigh the risks.

## 2014-12-23 NOTE — Progress Notes (Signed)
Gyn Onc Follow-up Note: Gyn-Onc  Consult was originally requested by Dr. Benay Spice for the evaluation of Lynn Ramirez 74 y.o. 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  CC:  Chief Complaint  Patient presents with  . Ovarian Cancer    follow-up    Assessment/Plan:  Lynn Ramirez  is a 74 y.o.  year old with presumed stage IV primary peritoneal carcinoma.  I performed a history, physical examination, and personally reviewed the patient's imaging films including CT abdomen and pelvis from 12/23/14.   Lynn Ramirez has demonstrated a very good response to chemotherapy with platinum and taxine. Her performance status is also very good with an ECOG performance status of 0-1. I believe that she is now that a candidate for export her surgery with cytoreductive effort with the goal being to reduce residual tumor in the less than 1 cm. Based on CT imaging I do not see unresectable disease or disease that would necessitate a suboptimal resection.   She still has underlying severe COPD. However I do not believe that this contraindicates surgery, though it does substantially increase her risk for perioperative pulmonary complications including postoperative prolonged ventilatory support. I discussed this again with the patient. I feel that this can be minimized if we can achieve a minimally invasive approach. Therefore we have scheduled the patient for a robotic-assisted hysterectomy, BSO, omentectomy. Given the appearance of the omental cake that is persistent on CT imaging and some concerns that she may require a laparotomy for optimal omental debulking. I discussed this with the patient. We discussed anticipated perioperative recovery and hospital length of stay depending upon whether this is achieved minimally invasively or open. I discussed surgical risks including  bleeding, infection, damage to internal organs (such as bladder,ureters, bowels), blood clot, reoperation and  rehospitalization.  Discussed that she hasn't increased risk for bleeding given her recent chemotherapy and therefore is at increased risk for needing a blood transfusion. I discussed that she is at an increased risk for an section postoperatively given her recent chemotherapy and immunosuppression. I discussed that surgery was not resectable disease and is not a curative procedure on its own, because surgical: Is to debulk the disease and minimize the tumor burden on which chemotherapy needs to work. She understands that after surgery chemotherapy will be necessary.  HPI: Lynn Ramirez is a very pleasant 74 year old woman (G3 P3) who is seen in consultation at the request of Dr. 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  The patient reports an episode of dyspnea requiring a chest x-ray in March 2016. She underwent chest x-ray which revealed a large right pleural effusion. An ultrasound guided thoracentesis on 09/30/2014 extracted 1.6 L of bloody fluid and cytology revealed carcinoma consistent on immunostains with gynecologic primary.  The CT scan showed a large omental cake, but normal ovaries and uterus. There was moderate ascites throughout the abdomen. He was a large right pleural effusion. There was some thickening of the small bowel mesentery and bowel wall.  She underwent CA-125 evaluation on 10/14/2014 and this was elevated at 4121 units per milliliter.  Of note she had a colonoscopy in [place] 2016 but there was no ability to visualize beyond the sigmoid colon secondary to sigmoid colon spasming. This had happened 10 years previously. Therefore the patient has never had a colonoscopy take evaluation of her colon proximal to the sigmoid.  She has a history of a left nephrectomy for congenital abnormality.  Interval History:   She  Underwent  3 cycles of carboplatin and paclitaxel chemotherapy with Dr. Benay Spice with her third cycle being administered on 12/09/2014. CT  imaging of the abdomen and pelvis to monitor for response was performed on 12/23/2014 and revealed: IMPRESSION: 1. Interval improvement in peritoneal carcinomatosis with near-complete resolution of ascites and decreased omental nodularity. 2. Significant improvement in malignant right pleural effusion. A small mildly complex right pleural effusion remains. 3. No other evidence of metastatic disease. No adnexal mass identified. 4. Stable cholelithiasis, left adrenal adenoma and atherosclerosis  CA 125 was initially elevated at 4121 in April at commencement of chemotherapy. It was decreased to 337U/mL at cycle 3.  Current Meds:  Outpatient Encounter Prescriptions as of 12/23/2014  Medication Sig  . acetaminophen (TYLENOL) 325 MG tablet Take 650 mg by mouth every 6 (six) hours as needed (for pain).   Marland Kitchen antiseptic oral rinse (BIOTENE) LIQD 15 mLs by Mouth Rinse route 3 (three) times daily.  . calcium carbonate (OS-CAL) 600 MG TABS Take 600 mg by mouth daily with breakfast.   . cholecalciferol (VITAMIN D) 1000 UNITS tablet Take 1,000 Units by mouth every morning.   . Famotidine (PEPCID AC PO) Take 1 tablet by mouth daily.  . fish oil-omega-3 fatty acids 1000 MG capsule Take 1 g by mouth every morning.   . lidocaine-prilocaine (EMLA) cream Apply to port site one hour prior to use. Do not rub in. Cover with plastic.  Marland Kitchen loratadine (CLARITIN) 10 MG tablet Take 10 mg by mouth at bedtime.   . ondansetron (ZOFRAN ODT) 4 MG disintegrating tablet Take 1 tablet (4 mg total) by mouth every 8 (eight) hours as needed for nausea or vomiting.  Marland Kitchen PRESCRIPTION MEDICATION Chemo - [facility]  . PROAIR HFA 108 (90 BASE) MCG/ACT inhaler INHALE 2 PUFFS BY MOUTH FOUR TIMES DAILY AS NEEDED FOR WHEEZING  . prochlorperazine (COMPAZINE) 5 MG tablet Take 1 tablet (5 mg total) by mouth every 6 (six) hours as needed for nausea or vomiting.  . sodium chloride (OCEAN) 0.65 % SOLN nasal spray Place 1-2 sprays into both nostrils  daily as needed for congestion.   . SYMBICORT 160-4.5 MCG/ACT inhaler INHALE 2 PUFFS BY MOUTH TWICE DAILY  . Tiotropium Bromide Monohydrate (SPIRIVA RESPIMAT) 2.5 MCG/ACT AERS Inhale 2 puffs into the lungs daily.  Marland Kitchen triamterene-hydrochlorothiazide (MAXZIDE-25) 37.5-25 MG per tablet Take 1 tablet by mouth every morning.   . zaleplon (SONATA) 5 MG capsule Take 1 capsule (5 mg total) by mouth at bedtime as needed for sleep.   No facility-administered encounter medications on file as of 12/23/2014.    Allergy:  Allergies  Allergen Reactions  . Latex Rash  . Penicillins Other (See Comments)    welps    Social Hx:   History   Social History  . Marital Status: Married    Spouse Name: N/A  . Number of Children: 3  . Years of Education: N/A   Occupational History  . RETIRED    Social History Main Topics  . Smoking status: Former Smoker -- 1.00 packs/day for 40 years    Types: Cigarettes    Quit date: 07/10/2013  . Smokeless tobacco: Never Used     Comment: Pt states that she is has struggled with smoking since Jan--1 cig here and there 04/09/14  . Alcohol Use: Yes     Comment: social   . Drug Use: No  . Sexual Activity: Not on file   Other Topics Concern  . Not on file   Social History Narrative  Past Surgical Hx:  Past Surgical History  Procedure Laterality Date  . Nephrectomy    . Tonsilectomy, adenoidectomy, bilateral myringotomy and tubes    . Tubal ligation    . Hand surgery Right 1980's  . Thoracentesis      Past Medical Hx:  Past Medical History  Diagnosis Date  . H/O hydronephrosis   . Hypertension   . Emphysema   . COPD (chronic obstructive pulmonary disease)   . Cancer     ovarian    Past Gynecological History:  G3P3, tubal ligation.  No LMP recorded. Patient is postmenopausal.  Family Hx:  Family History  Problem Relation Age of Onset  . Liver cancer Father   . Diabetes Father   . Liver cancer Mother   . Diabetes Paternal Grandfather   .  Colon cancer Neg Hx   . Stomach cancer Neg Hx     Review of Systems:  Constitutional  Feels fatigued  ENT Normal appearing ears and nares bilaterally Skin/Breast  No rash, sores, jaundice, itching, dryness Cardiovascular  No chest pain, shortness of breath, or edema  Pulmonary  No cough or wheeze.  Gastro Intestinal  No nausea, vomitting, or diarrhoea. No bright red blood per rectum, no abdominal pain, change in bowel movement, or constipation. See HPI Genito Urinary  No frequency, urgency, dysuria, see HPI Musculo Skeletal  No myalgia, arthralgia, joint swelling or pain  Neurologic  No weakness, numbness, change in gait,  Psychology  No depression, anxiety, insomnia.   Vitals:  Blood pressure 138/59, pulse 106, temperature 98.8 F (37.1 C), temperature source Oral, resp. rate 18, height 5' (1.524 m), weight 174 lb 9.6 oz (79.198 kg), SpO2 97 %.  Physical Exam: WD in NAD Neck  Supple NROM, without any enlargements.  Lymph Node Survey No cervical supraclavicular or inguinal adenopathy Cardiovascular  Pulse normal rate, regularity and rhythm. S1 and S2 normal.  Lungs  Clear to auscultation bilateraly, without wheezes/crackles/rhonchi. Good air movement. No pleural effusion appreciated.  Skin  No rash/lesions/breakdown  Psychiatry  Alert and oriented to person, place, and time  Abdomen  Normoactive bowel sounds, abdomen soft, non-tender and overweight, distended with ascites and dull to percuss, without evidence of hernia.  Back No CVA tenderness Genito Urinary  Vulva/vagina: deferred Rectal  Good tone, no masses no cul de sac nodularity.  Extremities  No bilateral cyanosis, clubbing or edema.   Donaciano Eva, MD   12/23/2014, 4:04 PM

## 2014-12-24 NOTE — Patient Instructions (Addendum)
YOUR PROCEDURE IS SCHEDULED ON :  12/29/14  REPORT TO [name] MAIN ENTRANCE FOLLOW SIGNS TO SHORT STAY CENTER AT : 10:30 AM  CALL THIS NUMBER IF YOU HAVE PROBLEMS THE MORNING OF SURGERY [phone]  REMEMBER:ONLY 1 PER PERSON MAY GO TO SHORT STAY WITH YOU TO GET READY THE MORNING OF YOUR SURGERY  DO NOT EAT FOOD OR DRINK LIQUIDS AFTER MIDNIGHT (MAY CONTINUE CLEAR LIQUIDS UNTIL 6:30 AM)  TAKE THESE MEDICINES THE MORNING OF SURGERY:  PEPCID / CLARITIN / USE SYMBICORT / [name] ON 12/28/14    CLEAR LIQUID DIET   Foods Allowed                                                                     Foods Excluded  Coffee and tea, regular and decaf                             liquids that you cannot  Plain Jell-O in any flavor                                             see through such as: Fruit ices (not with fruit pulp)                                     milk, soups, orange juice  Iced Popsicles                                                All solid food Carbonated beverages, regular and diet                                    Cranberry, grape and apple juices Sports drinks like Gatorade Lightly seasoned clear broth or consume(fat free) Sugar, honey syrup  _____________________________________________________________________    YOU MAY NOT HAVE ANY METAL ON YOUR BODY INCLUDING HAIR PINS AND PIERCING'S. DO NOT WEAR JEWELRY, MAKEUP, LOTIONS, POWDERS OR PERFUMES. DO NOT WEAR NAIL POLISH. DO NOT SHAVE 48 HRS PRIOR TO SURGERY. MEN MAY SHAVE FACE AND NECK.  DO NOT [name]. [name] IS NOT RESPONSIBLE FOR VALUABLES.  CONTACTS, DENTURES OR PARTIALS MAY NOT BE WORN TO SURGERY. LEAVE SUITCASE IN CAR. CAN BE BROUGHT TO ROOM AFTER SURGERY.  PATIENTS DISCHARGED THE DAY OF SURGERY WILL NOT BE ALLOWED TO DRIVE HOME.  PLEASE READ OVER THE FOLLOWING INSTRUCTION  SHEETS _________________________________________________________________________________                                          [name] - PREPARING FOR SURGERY  Before surgery, you can play an  important role.  Because skin is not sterile, your skin needs to be as free of germs as possible.  You can reduce the number of germs on your skin by washing with CHG (chlorahexidine gluconate) soap before surgery.  CHG is an antiseptic cleaner which kills germs and bonds with the skin to continue killing germs even after washing. Please DO NOT use if you have an allergy to CHG or antibacterial soaps.  If your skin becomes reddened/irritated stop using the CHG and inform your nurse when you arrive at Short Stay. Do not shave (including legs and underarms) for at least 48 hours prior to the first CHG shower.  You may shave your face. Please follow these instructions carefully:   1.  Shower with CHG Soap the night before surgery and the  morning of Surgery.   2.  If you choose to wash your hair, wash your hair first as usual with your  normal  Shampoo.   3.  After you shampoo, rinse your hair and body thoroughly to remove the  shampoo.                                         4.  Use CHG as you would any other liquid soap.  You can apply chg directly  to the skin and wash . Gently wash with scrungie or clean wascloth    5.  Apply the CHG Soap to your body ONLY FROM THE NECK DOWN.   Do not use on open                           Wound or open sores. Avoid contact with eyes, ears mouth and genitals (private parts).                        Genitals (private parts) with your normal soap.              6.  Wash thoroughly, paying special attention to the area where your surgery  will be performed.   7.  Thoroughly rinse your body with warm water from the neck down.   8.  DO NOT shower/wash with your normal soap after using and rinsing off  the CHG Soap .                9.  Pat yourself dry with a clean  towel.             10.  Wear clean night clothes to bed after shower             11.  Place clean sheets on your bed the night of your first shower and do not  sleep with pets.  Day of Surgery : Do not apply any lotions/deodorants the morning of surgery.  Please wear clean clothes to the hospital/surgery center.  FAILURE TO FOLLOW THESE INSTRUCTIONS MAY RESULT IN THE CANCELLATION OF YOUR SURGERY    PATIENT SIGNATURE_________________________________  ______________________________________________________________________     Lynn Ramirez  An incentive spirometer is a tool that can help keep your lungs clear and active. This tool measures how well you are filling your lungs with each breath. Taking long deep breaths may help reverse or decrease the chance of developing breathing (pulmonary) problems (especially infection) following:  A long period of time when you are  unable to move or be active. BEFORE THE PROCEDURE   If the spirometer includes an indicator to show your best effort, your nurse or respiratory therapist will set it to a desired goal.  If possible, sit up straight or lean slightly forward. Try not to slouch.  Hold the incentive spirometer in an upright position. INSTRUCTIONS FOR USE   Sit on the edge of your bed if possible, or sit up as far as you can in bed or on a chair.  Hold the incentive spirometer in an upright position.  Breathe out normally.  Place the mouthpiece in your mouth and seal your lips tightly around it.  Breathe in slowly and as deeply as possible, raising the piston or the ball toward the top of the column.  Hold your breath for 3-5 seconds or for as long as possible. Allow the piston or ball to fall to the bottom of the column.  Remove the mouthpiece from your mouth and breathe out normally.  Rest for a few seconds and repeat Steps 1 through 7 at least 10 times every 1-2 hours when you are awake. Take your time and take a few  normal breaths between deep breaths.  The spirometer may include an indicator to show your best effort. Use the indicator as a goal to work toward during each repetition.  After each set of 10 deep breaths, practice coughing to be sure your lungs are clear. If you have an incision (the cut made at the time of surgery), support your incision when coughing by placing a pillow or rolled up towels firmly against it. Once you are able to get out of bed, walk around indoors and cough well. You may stop using the incentive spirometer when instructed by your caregiver.  RISKS AND COMPLICATIONS  Take your time so you do not get dizzy or light-headed.  If you are in pain, you may need to take or ask for pain medication before doing incentive spirometry. It is harder to take a deep breath if you are having pain. AFTER USE  Rest and breathe slowly and easily.  It can be helpful to keep track of a log of your progress. Your caregiver can provide you with a simple table to help with this. If you are using the spirometer at home, follow these instructions: [place] IF:   You are having difficultly using the spirometer.  You have trouble using the spirometer as often as instructed.  Your pain medication is not giving enough relief while using the spirometer.  You develop fever of 100.5 F (38.1 C) or higher. SEEK IMMEDIATE MEDICAL CARE IF:   You cough up bloody sputum that had not been present before.  You develop fever of 102 F (38.9 C) or greater.  You develop worsening pain at or near the incision site. MAKE SURE YOU:   Understand these instructions.  Will watch your condition.  Will get help right away if you are not doing well or get worse. Document Released: 11/06/2006 Document Revised: 09/18/2011 Document Reviewed: 01/07/2007 Exit[employer] Patient Information 2014 [employer], [place].   ________________________________________________________________________  WHAT IS A BLOOD  TRANSFUSION? Blood Transfusion Information  A transfusion is the replacement of blood or some of its parts. Blood is made up of multiple cells which provide different functions.  Red blood cells carry oxygen and are used for blood loss replacement.  White blood cells fight against infection.  Platelets control bleeding.  Plasma helps clot blood.  Other blood products  are available for specialized needs, such as hemophilia or other clotting disorders. BEFORE THE TRANSFUSION  Who gives blood for transfusions?   Healthy volunteers who are fully evaluated to make sure their blood is safe. This is blood bank blood. Transfusion therapy is the safest it has ever been in the practice of medicine. Before blood is taken from a donor, a complete history is taken to make sure that person has no history of diseases nor engages in risky social behavior (examples are intravenous drug use or sexual activity with multiple partners). The donor's travel history is screened to minimize risk of transmitting infections, such as malaria. The donated blood is tested for signs of infectious diseases, such as HIV and hepatitis. The blood is then tested to be sure it is compatible with you in order to minimize the chance of a transfusion reaction. If you or a relative donates blood, this is often done in anticipation of surgery and is not appropriate for emergency situations. It takes many days to process the donated blood. RISKS AND COMPLICATIONS Although transfusion therapy is very safe and saves many lives, the main dangers of transfusion include:   Getting an infectious disease.  Developing a transfusion reaction. This is an allergic reaction to something in the blood you were given. Every precaution is taken to prevent this. The decision to have a blood transfusion has been considered carefully by your caregiver before blood is given. Blood is not given unless the benefits outweigh the risks. AFTER THE  TRANSFUSION  Right after receiving a blood transfusion, you will usually feel much better and more energetic. This is especially true if your red blood cells have gotten low (anemic). The transfusion raises the level of the red blood cells which carry oxygen, and this usually causes an energy increase.  The nurse administering the transfusion will monitor you carefully for complications. HOME CARE INSTRUCTIONS  No special instructions are needed after a transfusion. You may find your energy is better. Speak with your caregiver about any limitations on activity for underlying diseases you may have. SEEK MEDICAL CARE IF:   Your condition is not improving after your transfusion.  You develop redness or irritation at the intravenous (IV) site. SEEK IMMEDIATE MEDICAL CARE IF:  Any of the following symptoms occur over the next 12 hours:  Shaking chills.  You have a temperature by mouth above 102 F (38.9 C), not controlled by medicine.  Chest, back, or muscle pain.  People around you feel you are not acting correctly or are confused.  Shortness of breath or difficulty breathing.  Dizziness and fainting.  You get a rash or develop hives.  You have a decrease in urine output.  Your urine turns a dark color or changes to pink, red, or brown. Any of the following symptoms occur over the next 10 days:  You have a temperature by mouth above 102 F (38.9 C), not controlled by medicine.  Shortness of breath.  Weakness after normal activity.  The white part of the eye turns yellow (jaundice).  You have a decrease in the amount of urine or are urinating less often.  Your urine turns a dark color or changes to pink, red, or brown. Document Released: 06/23/2000 Document Revised: 09/18/2011 Document Reviewed: 02/10/2008 [facility] Patient Information 2014 [place], [place].  _______________________________________________________________________

## 2014-12-25 ENCOUNTER — Telehealth: Payer: Self-pay | Admitting: Oncology

## 2014-12-25 ENCOUNTER — Encounter (HOSPITAL_COMMUNITY): Payer: Self-pay

## 2014-12-25 ENCOUNTER — Ambulatory Visit: Payer: Medicare Other | Admitting: Gynecologic Oncology

## 2014-12-25 ENCOUNTER — Ambulatory Visit (HOSPITAL_BASED_OUTPATIENT_CLINIC_OR_DEPARTMENT_OTHER): Payer: Medicare Other | Admitting: Nurse Practitioner

## 2014-12-25 ENCOUNTER — Encounter (HOSPITAL_COMMUNITY)
Admission: RE | Admit: 2014-12-25 | Discharge: 2014-12-25 | Disposition: A | Discharge status: Home / Self Care | Payer: Medicare Other | Source: Ambulatory Visit | Attending: Gynecologic Oncology | Admitting: Gynecologic Oncology

## 2014-12-25 ENCOUNTER — Telehealth: Payer: Self-pay | Admitting: *Deleted

## 2014-12-25 ENCOUNTER — Other Ambulatory Visit: Payer: Self-pay

## 2014-12-25 VITALS — BP 141/65 | HR 85 | Temp 97.7°F | Resp 17 | Ht 60.0 in | Wt 173.2 lb

## 2014-12-25 DIAGNOSIS — C786 Secondary malignant neoplasm of retroperitoneum and peritoneum: Secondary | ICD-10-CM | POA: Insufficient documentation

## 2014-12-25 DIAGNOSIS — Z01818 Encounter for other preprocedural examination: Secondary | ICD-10-CM | POA: Insufficient documentation

## 2014-12-25 DIAGNOSIS — J910 Malignant pleural effusion: Secondary | ICD-10-CM | POA: Diagnosis not present

## 2014-12-25 DIAGNOSIS — C482 Malignant neoplasm of peritoneum, unspecified: Secondary | ICD-10-CM | POA: Diagnosis not present

## 2014-12-25 DIAGNOSIS — C799 Secondary malignant neoplasm of unspecified site: Secondary | ICD-10-CM

## 2014-12-25 DIAGNOSIS — C569 Malignant neoplasm of unspecified ovary: Secondary | ICD-10-CM

## 2014-12-25 HISTORY — DX: Personal history of other medical treatment: Z92.89

## 2014-12-25 HISTORY — DX: Sleep disorder, unspecified: G47.9

## 2014-12-25 HISTORY — DX: Gastro-esophageal reflux disease without esophagitis: K21.9

## 2014-12-25 HISTORY — DX: Dermatitis, unspecified: L30.9

## 2014-12-25 LAB — CBC WITH DIFFERENTIAL/PLATELET
BASOS PCT: 0 % (ref 0–1)
Basophils Absolute: 0 10*3/uL (ref 0.0–0.1)
Eosinophils Absolute: 0 10*3/uL (ref 0.0–0.7)
Eosinophils Relative: 1 % (ref 0–5)
HCT: 28.8 % — ABNORMAL LOW (ref 36.0–46.0)
HEMOGLOBIN: 8.9 g/dL — AB (ref 12.0–15.0)
LYMPHS PCT: 41 % (ref 12–46)
Lymphs Abs: 1.2 10*3/uL (ref 0.7–4.0)
MCH: 30.9 pg (ref 26.0–34.0)
MCHC: 30.9 g/dL (ref 30.0–36.0)
MCV: 100 fL (ref 78.0–100.0)
Monocytes Absolute: 0.6 10*3/uL (ref 0.1–1.0)
Monocytes Relative: 23 % — ABNORMAL HIGH (ref 3–12)
Neutro Abs: 0.9 10*3/uL — ABNORMAL LOW (ref 1.7–7.7)
Neutrophils Relative %: 35 % — ABNORMAL LOW (ref 43–77)
Platelets: 131 10*3/uL — ABNORMAL LOW (ref 150–400)
RBC: 2.88 MIL/uL — ABNORMAL LOW (ref 3.87–5.11)
RDW: 23.4 % — ABNORMAL HIGH (ref 11.5–15.5)
WBC: 2.7 10*3/uL — ABNORMAL LOW (ref 4.0–10.5)

## 2014-12-25 LAB — URINALYSIS, ROUTINE W REFLEX MICROSCOPIC
BILIRUBIN URINE: NEGATIVE
Glucose, UA: NEGATIVE mg/dL
Hgb urine dipstick: NEGATIVE
KETONES UR: NEGATIVE mg/dL
Leukocytes, UA: NEGATIVE
NITRITE: NEGATIVE
PH: 7.5 (ref 5.0–8.0)
Protein, ur: NEGATIVE mg/dL
Specific Gravity, Urine: 1.008 (ref 1.005–1.030)
UROBILINOGEN UA: 0.2 mg/dL (ref 0.0–1.0)

## 2014-12-25 LAB — COMPREHENSIVE METABOLIC PANEL
ALT: 17 U/L (ref 14–54)
AST: 18 U/L (ref 15–41)
Albumin: 3.5 g/dL (ref 3.5–5.0)
Alkaline Phosphatase: 62 U/L (ref 38–126)
Anion gap: 9 (ref 5–15)
BILIRUBIN TOTAL: 0.4 mg/dL (ref 0.3–1.2)
BUN: 18 mg/dL (ref 6–20)
CO2: 30 mmol/L (ref 22–32)
Calcium: 10 mg/dL (ref 8.9–10.3)
Chloride: 98 mmol/L — ABNORMAL LOW (ref 101–111)
Creatinine, Ser: 0.99 mg/dL (ref 0.44–1.00)
GFR calc Af Amer: >60 mL/min (ref >60)
GFR, EST NON AFRICAN AMERICAN: 56 mL/min — AB (ref >60)
Glucose, Bld: 112 mg/dL — ABNORMAL HIGH (ref 65–99)
Potassium: 4.1 mmol/L (ref 3.5–5.1)
SODIUM: 137 mmol/L (ref 135–145)
Total Protein: 7.5 g/dL (ref 6.5–8.1)

## 2014-12-25 LAB — ABO/RH: ABO/RH(D): O NEG

## 2014-12-25 NOTE — Telephone Encounter (Signed)
Pt confirmed labs/ov per 06/17 POF, gave pt AVS and Calendar.... KJ, sent msg to add chemo

## 2014-12-25 NOTE — Progress Notes (Addendum)
  Lynn Ramirez OFFICE PROGRESS NOTE   Diagnosis:  Peritoneal carcinoma  INTERVAL HISTORY:   Lynn Ramirez returns as scheduled. She completed cycle 3 Taxol/carboplatin 12/09/2014. She had mild nausea. No vomiting. No mouth sores. No diarrhea. She had increased "restless legs" for 2-3 nights. She has stable dyspnea on exertion. She has an occasional cough. No fever. She has intermittent mild numbness in the fingertips and toes.  Objective:  Vital signs in last 24 hours:  Blood pressure 141/65, pulse 85, temperature 97.7 F (36.5 C), temperature source Oral, resp. rate 17, height 5' (1.524 m), weight 173 lb 3.2 oz (78.563 kg), SpO2 96 %.    HEENT: No thrush or ulcers. Resp: Lungs clear bilaterally. Cardio: Regular rate and rhythm. GI: Abdomen soft and nontender. No hepatomegaly. No apparent ascites. No mass. Vascular: Trace edema right lower leg/ankle.  Skin: No rash. Port-A-Cath without erythema.    Lab Results:  Lab Results  Component Value Date   WBC 2.7* 12/25/2014   HGB 8.9* 12/25/2014   HCT 28.8* 12/25/2014   MCV 100.0 12/25/2014   PLT 131* 12/25/2014   NEUTROABS PENDING 12/25/2014    Imaging:  No results found.  Medications: I have reviewed the patient's current medications.  Assessment/Plan: 1. 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, immunohistochemical profile consistent with a GYN primary, elevated CA 125  Staging CTs of the chest, abdomen, and pelvis on 10/06/2014 revealed a loculated right pleural effusion, ascites, and omental/mesenteric thickening  Cytology from peritoneal fluid 10/13/2014 revealed malignant cells consistent with metastatic adenocarcinoma  Biopsy of an omental mass on 11/02/2014 revealed invasive serous carcinoma with psammoma bodies  Cycle 1 Taxol/carboplatin 10/28/2014  Cycle 2 Taxol/carboplatin 11/18/2014  Cycle 3 Taxol/carboplatin 12/09/2014  CT scan  12/23/2014 with interval improvement in peritoneal carcinomatosis with near-complete resolution of ascites and decreased omental nodularity. Significant improvement in malignant right pleural effusion. 2. COPD 3. Dyspnea secondary to COPD and the large right pleural effusion, status post therapeutic thoracentesis procedures 09/30/2014,10/09/2014, and 10/21/2014. Left thoracentesis 10/30/2014 4. Left nephrectomy as a child 5. Delayed nausea following cycle 1 Taxol/carboplatin, Aloxi/Emend added with cycle 2 with improvement. 6. Right lower extremity edema, right calf pain 12/09/2014. Negative venous Doppler 12/09/2014.   Disposition: Lynn Ramirez appears stable. She has completed 3 cycles of Taxol/carboplatin. The CA-125 has improved significantly. Recent restaging CT evaluation confirmed improvement as well. The plan is to proceed with debulking surgery by Dr. Denman George on 12/29/2014. We will plan to see her back on 01/20/2015 and tentative plans to resume chemotherapy that day. She will contact the office prior to her next visit with any problems.  Patient seen with Dr. Benay Spice. 25 minutes were spent face-to-face at today's visit with the majority of that time involved in counseling/coordination of care.    Ned Card ANP/GNP-BC   12/25/2014  10:43 AM  This was a shared visit with Ned Card. Lynn Ramirez has experienced clinical and radiologic improvement with 3 cycles of Taxol/carboplatin. She is scheduled to undergo debulking surgery on 12/29/2014. The plan is to resume chemotherapy following surgery.  Julieanne Manson, M.D.

## 2014-12-25 NOTE — Progress Notes (Signed)
Abnormal CBC faxed to Dr. Denman George

## 2014-12-25 NOTE — Telephone Encounter (Signed)
Per staff message and POF I have scheduled appts. Advised scheduler of appts. JMW  

## 2014-12-28 ENCOUNTER — Encounter: Payer: Self-pay | Admitting: Gynecologic Oncology

## 2014-12-28 ENCOUNTER — Other Ambulatory Visit: Payer: Self-pay | Admitting: Gynecologic Oncology

## 2014-12-28 ENCOUNTER — Other Ambulatory Visit (HOSPITAL_BASED_OUTPATIENT_CLINIC_OR_DEPARTMENT_OTHER): Payer: Medicare Other

## 2014-12-28 DIAGNOSIS — J910 Malignant pleural effusion: Secondary | ICD-10-CM | POA: Diagnosis not present

## 2014-12-28 DIAGNOSIS — C482 Malignant neoplasm of peritoneum, unspecified: Secondary | ICD-10-CM | POA: Diagnosis present

## 2014-12-28 LAB — CBC WITH DIFFERENTIAL/PLATELET
BASO%: 0.6 % (ref 0.0–2.0)
Basophils Absolute: 0 10*3/uL (ref 0.0–0.1)
EOS%: 1.4 % (ref 0.0–7.0)
Eosinophils Absolute: 0.1 10*3/uL (ref 0.0–0.5)
HCT: 27.9 % — ABNORMAL LOW (ref 34.8–46.6)
HGB: 8.7 g/dL — ABNORMAL LOW (ref 11.6–15.9)
LYMPH%: 24.5 % (ref 14.0–49.7)
MCH: 30.9 pg (ref 25.1–34.0)
MCHC: 31.2 g/dL — AB (ref 31.5–36.0)
MCV: 98.9 fL (ref 79.5–101.0)
MONO#: 0.6 10*3/uL (ref 0.1–0.9)
MONO%: 12 % (ref 0.0–14.0)
NEUT#: 3 10*3/uL (ref 1.5–6.5)
NEUT%: 61.5 % (ref 38.4–76.8)
NRBC: 1 % — AB (ref 0–0)
PLATELETS: 123 10*3/uL — AB (ref 145–400)
RBC: 2.82 10*6/uL — AB (ref 3.70–5.45)
RDW: 23.8 % — ABNORMAL HIGH (ref 11.2–14.5)
WBC: 4.9 10*3/uL (ref 3.9–10.3)
lymph#: 1.2 10*3/uL (ref 0.9–3.3)

## 2014-12-28 NOTE — Patient Instructions (Signed)
CBC rechecked today due to WBC count and platelet count on pre-op labs.  WBC count up to 4.9 from 2.7.  Platelet 123 today from 131.  Dr. Denman George reviewed the labs and is agreeable with proceeding with surgery tomorrow.  Lovenox will be held pre-op.

## 2014-12-29 ENCOUNTER — Encounter (HOSPITAL_COMMUNITY): Payer: Self-pay | Admitting: *Deleted

## 2014-12-29 ENCOUNTER — Inpatient Hospital Stay (HOSPITAL_COMMUNITY): Payer: Medicare Other | Admitting: Anesthesiology

## 2014-12-29 ENCOUNTER — Encounter (HOSPITAL_COMMUNITY)
Admission: RE | Disposition: A | Discharge status: Home / Self Care | Payer: Self-pay | Source: Ambulatory Visit | Attending: Gynecologic Oncology

## 2014-12-29 ENCOUNTER — Ambulatory Visit (HOSPITAL_COMMUNITY)
Admission: RE | Admit: 2014-12-29 | Discharge: 2014-12-30 | Disposition: A | Discharge status: Home / Self Care | Payer: Medicare Other | Source: Ambulatory Visit | Attending: Gynecologic Oncology | Admitting: Gynecologic Oncology

## 2014-12-29 DIAGNOSIS — I10 Essential (primary) hypertension: Secondary | ICD-10-CM | POA: Diagnosis not present

## 2014-12-29 DIAGNOSIS — C5701 Malignant neoplasm of right fallopian tube: Principal | ICD-10-CM | POA: Insufficient documentation

## 2014-12-29 DIAGNOSIS — C482 Malignant neoplasm of peritoneum, unspecified: Secondary | ICD-10-CM | POA: Diagnosis present

## 2014-12-29 DIAGNOSIS — C786 Secondary malignant neoplasm of retroperitoneum and peritoneum: Secondary | ICD-10-CM | POA: Diagnosis not present

## 2014-12-29 DIAGNOSIS — K8020 Calculus of gallbladder without cholecystitis without obstruction: Secondary | ICD-10-CM | POA: Insufficient documentation

## 2014-12-29 DIAGNOSIS — C5702 Malignant neoplasm of left fallopian tube: Secondary | ICD-10-CM | POA: Insufficient documentation

## 2014-12-29 DIAGNOSIS — Z91040 Latex allergy status: Secondary | ICD-10-CM | POA: Diagnosis not present

## 2014-12-29 DIAGNOSIS — K660 Peritoneal adhesions (postprocedural) (postinfection): Secondary | ICD-10-CM | POA: Insufficient documentation

## 2014-12-29 DIAGNOSIS — Z87891 Personal history of nicotine dependence: Secondary | ICD-10-CM | POA: Diagnosis not present

## 2014-12-29 DIAGNOSIS — I708 Atherosclerosis of other arteries: Secondary | ICD-10-CM | POA: Insufficient documentation

## 2014-12-29 DIAGNOSIS — D3502 Benign neoplasm of left adrenal gland: Secondary | ICD-10-CM | POA: Diagnosis not present

## 2014-12-29 DIAGNOSIS — C569 Malignant neoplasm of unspecified ovary: Secondary | ICD-10-CM | POA: Diagnosis present

## 2014-12-29 DIAGNOSIS — C561 Malignant neoplasm of right ovary: Secondary | ICD-10-CM | POA: Insufficient documentation

## 2014-12-29 DIAGNOSIS — J45909 Unspecified asthma, uncomplicated: Secondary | ICD-10-CM | POA: Diagnosis not present

## 2014-12-29 DIAGNOSIS — J449 Chronic obstructive pulmonary disease, unspecified: Secondary | ICD-10-CM | POA: Insufficient documentation

## 2014-12-29 DIAGNOSIS — C801 Malignant (primary) neoplasm, unspecified: Secondary | ICD-10-CM

## 2014-12-29 DIAGNOSIS — K219 Gastro-esophageal reflux disease without esophagitis: Secondary | ICD-10-CM | POA: Insufficient documentation

## 2014-12-29 DIAGNOSIS — Z880 Allergy status to penicillin: Secondary | ICD-10-CM | POA: Insufficient documentation

## 2014-12-29 DIAGNOSIS — J910 Malignant pleural effusion: Secondary | ICD-10-CM | POA: Diagnosis not present

## 2014-12-29 DIAGNOSIS — C7961 Secondary malignant neoplasm of right ovary: Secondary | ICD-10-CM | POA: Diagnosis not present

## 2014-12-29 DIAGNOSIS — C562 Malignant neoplasm of left ovary: Secondary | ICD-10-CM | POA: Diagnosis not present

## 2014-12-29 HISTORY — PX: LAPAROTOMY: SHX154

## 2014-12-29 HISTORY — PX: ROBOTIC ASSISTED TOTAL HYSTERECTOMY WITH BILATERAL SALPINGO OOPHERECTOMY: SHX6086

## 2014-12-29 SURGERY — ROBOTIC ASSISTED TOTAL HYSTERECTOMY WITH BILATERAL SALPINGO OOPHORECTOMY
Anesthesia: General

## 2014-12-29 MED ORDER — 0.9 % SODIUM CHLORIDE (POUR BTL) OPTIME
TOPICAL | Status: DC | PRN
  Administered 2014-12-29: 2000 mL

## 2014-12-29 MED ORDER — FENTANYL CITRATE (PF) 250 MCG/5ML IJ SOLN
INTRAMUSCULAR | Status: AC
  Filled 2014-12-29: qty 5

## 2014-12-29 MED ORDER — LACTATED RINGERS IV SOLN
INTRAVENOUS | Status: DC
  Administered 2014-12-29: 1000 mL via INTRAVENOUS
  Administered 2014-12-29: 17:00:00 via INTRAVENOUS

## 2014-12-29 MED ORDER — ENOXAPARIN SODIUM 40 MG/0.4ML ~~LOC~~ SOLN
40.0000 mg | SUBCUTANEOUS | Status: DC
Start: 2014-12-30 — End: 2014-12-30
  Administered 2014-12-30: 40 mg via SUBCUTANEOUS
  Filled 2014-12-29 (×2): qty 0.4

## 2014-12-29 MED ORDER — SODIUM CHLORIDE 0.9 % IJ SOLN
INTRAMUSCULAR | Status: AC
  Filled 2014-12-29: qty 10

## 2014-12-29 MED ORDER — ALBUTEROL SULFATE (2.5 MG/3ML) 0.083% IN NEBU: 2.5000 mg | INHALATION_SOLUTION | Freq: Four times a day (QID) | RESPIRATORY_TRACT | Status: DC | PRN

## 2014-12-29 MED ORDER — ONDANSETRON HCL 4 MG/2ML IJ SOLN
INTRAMUSCULAR | Status: DC | PRN
  Administered 2014-12-29: 4 mg via INTRAVENOUS

## 2014-12-29 MED ORDER — SODIUM CHLORIDE 0.9 % IJ SOLN
INTRAMUSCULAR | Status: AC
  Filled 2014-12-29: qty 20

## 2014-12-29 MED ORDER — SODIUM CHLORIDE 0.9 % IJ SOLN
INTRAMUSCULAR | Status: DC | PRN
  Administered 2014-12-29: 20 mL

## 2014-12-29 MED ORDER — TIOTROPIUM BROMIDE MONOHYDRATE 2.5 MCG/ACT IN AERS: 2.0000 | INHALATION_SPRAY | Freq: Every day | RESPIRATORY_TRACT | Status: DC

## 2014-12-29 MED ORDER — HYDROMORPHONE HCL 1 MG/ML IJ SOLN
0.2000 mg | INTRAMUSCULAR | Status: DC | PRN
  Administered 2014-12-29: 0.6 mg via INTRAVENOUS
  Filled 2014-12-29: qty 1

## 2014-12-29 MED ORDER — CIPROFLOXACIN IN D5W 400 MG/200ML IV SOLN
400.0000 mg | INTRAVENOUS | Status: AC
  Administered 2014-12-29: 400 mg via INTRAVENOUS

## 2014-12-29 MED ORDER — PROPOFOL 10 MG/ML IV BOLUS
INTRAVENOUS | Status: AC
  Filled 2014-12-29: qty 20

## 2014-12-29 MED ORDER — ROCURONIUM BROMIDE 100 MG/10ML IV SOLN
INTRAVENOUS | Status: DC | PRN
  Administered 2014-12-29: 10 mg via INTRAVENOUS
  Administered 2014-12-29: 40 mg via INTRAVENOUS

## 2014-12-29 MED ORDER — GLYCOPYRROLATE 0.2 MG/ML IJ SOLN
INTRAMUSCULAR | Status: DC | PRN
  Administered 2014-12-29: .6 mg via INTRAVENOUS

## 2014-12-29 MED ORDER — PROPOFOL 10 MG/ML IV BOLUS
INTRAVENOUS | Status: DC | PRN
  Administered 2014-12-29: 150 mg via INTRAVENOUS

## 2014-12-29 MED ORDER — LACTATED RINGERS IV SOLN
INTRAVENOUS | Status: DC | PRN
  Administered 2014-12-29: 14:00:00 via INTRAVENOUS

## 2014-12-29 MED ORDER — ONDANSETRON HCL 4 MG/2ML IJ SOLN
4.0000 mg | Freq: Once | INTRAMUSCULAR | Status: AC | PRN
  Administered 2014-12-29: 4 mg via INTRAVENOUS

## 2014-12-29 MED ORDER — DEXAMETHASONE SODIUM PHOSPHATE 10 MG/ML IJ SOLN
INTRAMUSCULAR | Status: DC | PRN
  Administered 2014-12-29: 10 mg via INTRAVENOUS

## 2014-12-29 MED ORDER — LIDOCAINE HCL (CARDIAC) 20 MG/ML IV SOLN
INTRAVENOUS | Status: AC
  Filled 2014-12-29: qty 5

## 2014-12-29 MED ORDER — ROCURONIUM BROMIDE 100 MG/10ML IV SOLN
INTRAVENOUS | Status: AC
  Filled 2014-12-29: qty 1

## 2014-12-29 MED ORDER — NEOSTIGMINE METHYLSULFATE 10 MG/10ML IV SOLN
INTRAVENOUS | Status: AC
  Filled 2014-12-29: qty 1

## 2014-12-29 MED ORDER — LORATADINE 10 MG PO TABS
10.0000 mg | ORAL_TABLET | Freq: Every day | ORAL | Status: DC
  Filled 2014-12-29: qty 1

## 2014-12-29 MED ORDER — ONDANSETRON HCL 4 MG/2ML IJ SOLN
INTRAMUSCULAR | Status: AC
  Filled 2014-12-29: qty 2

## 2014-12-29 MED ORDER — LACTATED RINGERS IV SOLN
INTRAVENOUS | Status: DC | PRN
  Administered 2014-12-29: 1000 mL

## 2014-12-29 MED ORDER — FENTANYL CITRATE (PF) 250 MCG/5ML IJ SOLN
INTRAMUSCULAR | Status: DC | PRN
  Administered 2014-12-29 (×2): 50 ug via INTRAVENOUS
  Administered 2014-12-29: 100 ug via INTRAVENOUS
  Administered 2014-12-29 (×4): 50 ug via INTRAVENOUS
  Administered 2014-12-29: 100 ug via INTRAVENOUS

## 2014-12-29 MED ORDER — CLINDAMYCIN PHOSPHATE 900 MG/50ML IV SOLN
INTRAVENOUS | Status: AC
  Filled 2014-12-29: qty 50

## 2014-12-29 MED ORDER — NEOSTIGMINE METHYLSULFATE 10 MG/10ML IV SOLN
INTRAVENOUS | Status: DC | PRN
  Administered 2014-12-29: 4 mg via INTRAVENOUS

## 2014-12-29 MED ORDER — GLYCOPYRROLATE 0.2 MG/ML IJ SOLN
INTRAMUSCULAR | Status: AC
  Filled 2014-12-29: qty 3

## 2014-12-29 MED ORDER — HYDROMORPHONE HCL 2 MG/ML IJ SOLN
INTRAMUSCULAR | Status: AC
  Filled 2014-12-29: qty 1

## 2014-12-29 MED ORDER — IBUPROFEN 800 MG PO TABS: 800.0000 mg | ORAL_TABLET | Freq: Three times a day (TID) | ORAL | Status: DC | PRN

## 2014-12-29 MED ORDER — ALBUTEROL SULFATE HFA 108 (90 BASE) MCG/ACT IN AERS: 2.0000 | INHALATION_SPRAY | Freq: Four times a day (QID) | RESPIRATORY_TRACT | Status: DC | PRN

## 2014-12-29 MED ORDER — LIDOCAINE HCL (CARDIAC) 20 MG/ML IV SOLN
INTRAVENOUS | Status: DC | PRN
  Administered 2014-12-29: 30 mg via INTRAVENOUS

## 2014-12-29 MED ORDER — SUCCINYLCHOLINE CHLORIDE 20 MG/ML IJ SOLN
INTRAMUSCULAR | Status: DC | PRN
  Administered 2014-12-29: 100 mg via INTRAVENOUS

## 2014-12-29 MED ORDER — FENTANYL CITRATE (PF) 100 MCG/2ML IJ SOLN
INTRAMUSCULAR | Status: AC
  Filled 2014-12-29: qty 2

## 2014-12-29 MED ORDER — BUPIVACAINE LIPOSOME 1.3 % IJ SUSP
20.0000 mL | Freq: Once | INTRAMUSCULAR | Status: AC
  Administered 2014-12-29: 20 mL
  Filled 2014-12-29: qty 20

## 2014-12-29 MED ORDER — BUDESONIDE-FORMOTEROL FUMARATE 160-4.5 MCG/ACT IN AERO
2.0000 | INHALATION_SPRAY | Freq: Two times a day (BID) | RESPIRATORY_TRACT | Status: DC
  Administered 2014-12-29 – 2014-12-30 (×2): 2 via RESPIRATORY_TRACT
  Filled 2014-12-29: qty 6

## 2014-12-29 MED ORDER — STERILE WATER FOR IRRIGATION IR SOLN
Status: DC | PRN
  Administered 2014-12-29: 1500 mL

## 2014-12-29 MED ORDER — ONDANSETRON HCL 4 MG/2ML IJ SOLN: 4.0000 mg | Freq: Four times a day (QID) | INTRAMUSCULAR | Status: DC | PRN

## 2014-12-29 MED ORDER — CIPROFLOXACIN IN D5W 400 MG/200ML IV SOLN
INTRAVENOUS | Status: AC
  Filled 2014-12-29: qty 200

## 2014-12-29 MED ORDER — FENTANYL CITRATE (PF) 100 MCG/2ML IJ SOLN
25.0000 ug | INTRAMUSCULAR | Status: DC | PRN
  Administered 2014-12-29: 25 ug via INTRAVENOUS
  Administered 2014-12-29: 50 ug via INTRAVENOUS
  Administered 2014-12-29: 25 ug via INTRAVENOUS

## 2014-12-29 MED ORDER — FAMOTIDINE 10 MG PO TABS
10.0000 mg | ORAL_TABLET | Freq: Every day | ORAL | Status: DC
  Administered 2014-12-30: 10 mg via ORAL
  Filled 2014-12-29: qty 1

## 2014-12-29 MED ORDER — KCL IN DEXTROSE-NACL 20-5-0.45 MEQ/L-%-% IV SOLN
INTRAVENOUS | Status: DC
  Administered 2014-12-29: via INTRAVENOUS
  Filled 2014-12-29 (×2): qty 1000

## 2014-12-29 MED ORDER — HYDROMORPHONE HCL 1 MG/ML IJ SOLN
INTRAMUSCULAR | Status: DC | PRN
  Administered 2014-12-29: .4 mg via INTRAVENOUS
  Administered 2014-12-29: .2 mg via INTRAVENOUS

## 2014-12-29 MED ORDER — DEXAMETHASONE SODIUM PHOSPHATE 10 MG/ML IJ SOLN
INTRAMUSCULAR | Status: AC
  Filled 2014-12-29: qty 1

## 2014-12-29 MED ORDER — OXYCODONE-ACETAMINOPHEN 5-325 MG PO TABS
1.0000 | ORAL_TABLET | ORAL | Status: DC | PRN
  Administered 2014-12-30 (×3): 1 via ORAL
  Filled 2014-12-29 (×3): qty 1

## 2014-12-29 MED ORDER — FENTANYL CITRATE (PF) 100 MCG/2ML IJ SOLN
INTRAMUSCULAR | Status: AC
Start: 2014-12-29 — End: 2014-12-30
  Filled 2014-12-29: qty 2

## 2014-12-29 MED ORDER — CLINDAMYCIN PHOSPHATE 900 MG/50ML IV SOLN
900.0000 mg | INTRAVENOUS | Status: AC
  Administered 2014-12-29: 900 mg via INTRAVENOUS

## 2014-12-29 MED ORDER — ONDANSETRON HCL 4 MG PO TABS: 4.0000 mg | ORAL_TABLET | Freq: Four times a day (QID) | ORAL | Status: DC | PRN

## 2014-12-29 SURGICAL SUPPLY — 70 items
BLADE EXTENDED COATED 6.5IN (ELECTRODE) ×3 IMPLANT
CABLE HIGH FREQUENCY MONO STRZ (ELECTRODE) ×3 IMPLANT
CATH FOLEY LATEX FREE 16FR (CATHETERS) ×3 IMPLANT
CELLS DAT CNTRL 66122 CELL SVR (MISCELLANEOUS) ×2 IMPLANT
CHLORAPREP W/TINT 26ML (MISCELLANEOUS) ×3 IMPLANT
CORDS BIPOLAR (ELECTRODE) IMPLANT
COVER SURGICAL LIGHT HANDLE (MISCELLANEOUS) ×3 IMPLANT
COVER TIP SHEARS 8 DVNC (MISCELLANEOUS) ×2 IMPLANT
COVER TIP SHEARS 8MM DA VINCI (MISCELLANEOUS) ×1
DRAPE ARM DVNC X/XI (DISPOSABLE) ×8 IMPLANT
DRAPE COLUMN DVNC XI (DISPOSABLE) ×2 IMPLANT
DRAPE DA VINCI XI ARM (DISPOSABLE) ×4
DRAPE DA VINCI XI COLUMN (DISPOSABLE) ×1
DRAPE SHEET LG 3/4 BI-LAMINATE (DRAPES) ×6 IMPLANT
DRAPE SURG IRRIG POUCH 19X23 (DRAPES) ×3 IMPLANT
DRAPE TABLE BACK 44X90 PK DISP (DRAPES) ×6 IMPLANT
DRAPE WARM FLUID 44X44 (DRAPE) ×3 IMPLANT
DRSG OPSITE POSTOP 4X8 (GAUZE/BANDAGES/DRESSINGS) ×3 IMPLANT
DRSG TEGADERM 6X8 (GAUZE/BANDAGES/DRESSINGS) IMPLANT
ELECT REM PT RETURN 9FT ADLT (ELECTROSURGICAL) ×3
ELECTRODE REM PT RTRN 9FT ADLT (ELECTROSURGICAL) ×2 IMPLANT
GAUZE VASELINE 1X8 (GAUZE/BANDAGES/DRESSINGS) ×3 IMPLANT
GLOVE BIO SURGEON STRL SZ 6 (GLOVE) IMPLANT
GLOVE BIO SURGEON STRL SZ 6.5 (GLOVE) IMPLANT
GOWN STRL REUS W/ TWL LRG LVL3 (GOWN DISPOSABLE) ×6 IMPLANT
GOWN STRL REUS W/TWL LRG LVL3 (GOWN DISPOSABLE) ×3
HOLDER FOLEY CATH W/STRAP (MISCELLANEOUS) ×3 IMPLANT
KIT ACCESSORY DA VINCI DISP (KITS)
KIT ACCESSORY DVNC DISP (KITS) IMPLANT
KIT BASIN OR (CUSTOM PROCEDURE TRAY) ×3 IMPLANT
LIQUID BAND (GAUZE/BANDAGES/DRESSINGS) ×3 IMPLANT
MANIPULATOR UTERINE 4.5 ZUMI (MISCELLANEOUS) ×3 IMPLANT
OCCLUDER COLPOPNEUMO (BALLOONS) ×3 IMPLANT
PEN SKIN MARKING BROAD (MISCELLANEOUS) ×3 IMPLANT
PENCIL BUTTON HOLSTER BLD 10FT (ELECTRODE) ×3 IMPLANT
PORT ACCESS TROCAR AIRSEAL 12 (TROCAR) ×2 IMPLANT
PORT ACCESS TROCAR AIRSEAL 5M (TROCAR) ×1
POUCH SPECIMEN RETRIEVAL 10MM (ENDOMECHANICALS) ×3 IMPLANT
RTRCTR WOUND ALEXIS 18CM MED (MISCELLANEOUS) ×3
SEAL CANN UNIV 5-8 DVNC XI (MISCELLANEOUS) ×8 IMPLANT
SEAL XI 5MM-8MM UNIVERSAL (MISCELLANEOUS) ×4
SEALER VESSEL DA VINCI XI (MISCELLANEOUS) ×1
SEALER VESSEL EXT DVNC XI (MISCELLANEOUS) ×2 IMPLANT
SET TRI-LUMEN FLTR TB AIRSEAL (TUBING) ×3 IMPLANT
SET TUBE IRRIG SUCTION NO TIP (IRRIGATION / IRRIGATOR) ×3 IMPLANT
SHEET LAVH (DRAPES) ×3 IMPLANT
SLEEVE XCEL OPT CAN 5 100 (ENDOMECHANICALS) ×3 IMPLANT
SOLUTION ELECTROLUBE (MISCELLANEOUS) ×3 IMPLANT
SPONGE LAP 18X18 X RAY DECT (DISPOSABLE) ×6 IMPLANT
SUT MNCRL AB 4-0 PS2 18 (SUTURE) ×3 IMPLANT
SUT PDS AB 1 TP1 96 (SUTURE) ×6 IMPLANT
SUT SILK 2 0 SH CR/8 (SUTURE) ×3 IMPLANT
SUT VIC AB 0 CT1 27 (SUTURE) ×1
SUT VIC AB 0 CT1 27XBRD ANTBC (SUTURE) ×2 IMPLANT
SUT VIC AB 3-0 SH 27 (SUTURE) ×1
SUT VIC AB 3-0 SH 27XBRD (SUTURE) ×2 IMPLANT
SUT VIC AB 4-0 PS2 27 (SUTURE) ×6 IMPLANT
SUT VICRYL 0 UR6 27IN ABS (SUTURE) ×3 IMPLANT
SYR 50ML LL SCALE MARK (SYRINGE) ×3 IMPLANT
SYR CONTROL 10ML LL (SYRINGE) ×3 IMPLANT
TOWEL OR 17X26 10 PK STRL BLUE (TOWEL DISPOSABLE) ×6 IMPLANT
TOWEL OR NON WOVEN STRL DISP B (DISPOSABLE) ×3 IMPLANT
TRAP SPECIMEN MUCOUS 40CC (MISCELLANEOUS) IMPLANT
TRAY LAPAROSCOPIC (CUSTOM PROCEDURE TRAY) ×3 IMPLANT
TROCAR 12M 150ML BLUNT (TROCAR) ×3 IMPLANT
TROCAR BLADELESS OPT 5 100 (ENDOMECHANICALS) ×3 IMPLANT
TROCAR XCEL 12X100 BLDLESS (ENDOMECHANICALS) ×3 IMPLANT
TUBING INSUFFLATION 10FT LAP (TUBING) ×3 IMPLANT
WATER STERILE IRR 1500ML POUR (IV SOLUTION) ×3 IMPLANT
YANKAUER SUCT BULB TIP 10FT TU (MISCELLANEOUS) ×3 IMPLANT

## 2014-12-29 NOTE — Anesthesia Preprocedure Evaluation (Signed)
Anesthesia Evaluation  Patient identified by MRN, date of birth, ID band Patient awake    Reviewed: Allergy & Precautions, NPO status , Patient's Chart, lab work & pertinent test results  History of Anesthesia Complications Negative for: history of anesthetic complications  Airway Mallampati: II  TM Distance: >3 FB Neck ROM: Full    Dental no notable dental hx. (+) Dental Advisory Given   Pulmonary asthma , COPDformer smoker,  breath sounds clear to auscultation  Pulmonary exam normal       Cardiovascular hypertension, Pt. on medications Normal cardiovascular examRhythm:Regular Rate:Normal     Neuro/Psych negative neurological ROS  negative psych ROS   GI/Hepatic Neg liver ROS, GERD-  Medicated and Controlled,  Endo/Other  negative endocrine ROS  Renal/GU negative Renal ROS  negative genitourinary   Musculoskeletal negative musculoskeletal ROS (+)   Abdominal   Peds negative pediatric ROS (+)  Hematology negative hematology ROS (+)   Anesthesia Other Findings   Reproductive/Obstetrics negative OB ROS                             Anesthesia Physical Anesthesia Plan  ASA: III  Anesthesia Plan: General   Post-op Pain Management:    Induction: Intravenous  Airway Management Planned: Oral ETT  Additional Equipment:   Intra-op Plan:   Post-operative Plan: Extubation in OR  Informed Consent: I have reviewed the patients History and Physical, chart, labs and discussed the procedure including the risks, benefits and alternatives for the proposed anesthesia with the patient or authorized representative who has indicated his/her understanding and acceptance.   Dental advisory given  Plan Discussed with: CRNA  Anesthesia Plan Comments:         Anesthesia Quick Evaluation

## 2014-12-29 NOTE — H&P (View-Only) (Signed)
Gyn Onc Follow-up Note: Gyn-Onc  Consult was originally requested by Dr. Benay Spice for the evaluation of Lynn Ramirez 74 y.o. 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  CC:  Chief Complaint  Patient presents with  . Ovarian Cancer    follow-up    Assessment/Plan:  Lynn Ramirez  is a 74 y.o.  year old with presumed stage IV primary peritoneal carcinoma.  I performed a history, physical examination, and personally reviewed the patient's imaging films including CT abdomen and pelvis from 12/23/14.   Lynn Ramirez has demonstrated a very good response to chemotherapy with platinum and taxine. Her performance status is also very good with an ECOG performance status of 0-1. I believe that she is now that a candidate for export her surgery with cytoreductive effort with the goal being to reduce residual tumor in the less than 1 cm. Based on CT imaging I do not see unresectable disease or disease that would necessitate a suboptimal resection.   She still has underlying severe COPD. However I do not believe that this contraindicates surgery, though it does substantially increase her risk for perioperative pulmonary complications including postoperative prolonged ventilatory support. I discussed this again with the patient. I feel that this can be minimized if we can achieve a minimally invasive approach. Therefore we have scheduled the patient for a robotic-assisted hysterectomy, BSO, omentectomy. Given the appearance of the omental cake that is persistent on CT imaging and some concerns that she may require a laparotomy for optimal omental debulking. I discussed this with the patient. We discussed anticipated perioperative recovery and hospital length of stay depending upon whether this is achieved minimally invasively or open. I discussed surgical risks including  bleeding, infection, damage to internal organs (such as bladder,ureters, bowels), blood clot, reoperation and  rehospitalization.  Discussed that she hasn't increased risk for bleeding given her recent chemotherapy and therefore is at increased risk for needing a blood transfusion. I discussed that she is at an increased risk for an section postoperatively given her recent chemotherapy and immunosuppression. I discussed that surgery was not resectable disease and is not a curative procedure on its own, because surgical: Is to debulk the disease and minimize the tumor burden on which chemotherapy needs to work. She understands that after surgery chemotherapy will be necessary.  HPI: Lynn Ramirez is a very pleasant 74 year old woman (G3 P3) who is seen in consultation at the request of Dr. 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  The patient reports an episode of dyspnea requiring a chest x-ray in March 2016. She underwent chest x-ray which revealed a large right pleural effusion. An ultrasound guided thoracentesis on 09/30/2014 extracted 1.6 L of bloody fluid and cytology revealed carcinoma consistent on immunostains with gynecologic primary.  The CT scan showed a large omental cake, but normal ovaries and uterus. There was moderate ascites throughout the abdomen. He was a large right pleural effusion. There was some thickening of the small bowel mesentery and bowel wall.  She underwent CA-125 evaluation on 10/14/2014 and this was elevated at 4121 units per milliliter.  Of note she had a colonoscopy in [place] 2016 but there was no ability to visualize beyond the sigmoid colon secondary to sigmoid colon spasming. This had happened 10 years previously. Therefore the patient has never had a colonoscopy take evaluation of her colon proximal to the sigmoid.  She has a history of a left nephrectomy for congenital abnormality.  Interval History:   She  Underwent  3 cycles of carboplatin and paclitaxel chemotherapy with Dr. Benay Spice with her third cycle being administered on 12/09/2014. CT  imaging of the abdomen and pelvis to monitor for response was performed on 12/23/2014 and revealed: IMPRESSION: 1. Interval improvement in peritoneal carcinomatosis with near-complete resolution of ascites and decreased omental nodularity. 2. Significant improvement in malignant right pleural effusion. A small mildly complex right pleural effusion remains. 3. No other evidence of metastatic disease. No adnexal mass identified. 4. Stable cholelithiasis, left adrenal adenoma and atherosclerosis  CA 125 was initially elevated at 4121 in April at commencement of chemotherapy. It was decreased to 337U/mL at cycle 3.  Current Meds:  Outpatient Encounter Prescriptions as of 12/23/2014  Medication Sig  . acetaminophen (TYLENOL) 325 MG tablet Take 650 mg by mouth every 6 (six) hours as needed (for pain).   Marland Kitchen antiseptic oral rinse (BIOTENE) LIQD 15 mLs by Mouth Rinse route 3 (three) times daily.  . calcium carbonate (OS-CAL) 600 MG TABS Take 600 mg by mouth daily with breakfast.   . cholecalciferol (VITAMIN D) 1000 UNITS tablet Take 1,000 Units by mouth every morning.   . Famotidine (PEPCID AC PO) Take 1 tablet by mouth daily.  . fish oil-omega-3 fatty acids 1000 MG capsule Take 1 g by mouth every morning.   . lidocaine-prilocaine (EMLA) cream Apply to port site one hour prior to use. Do not rub in. Cover with plastic.  Marland Kitchen loratadine (CLARITIN) 10 MG tablet Take 10 mg by mouth at bedtime.   . ondansetron (ZOFRAN ODT) 4 MG disintegrating tablet Take 1 tablet (4 mg total) by mouth every 8 (eight) hours as needed for nausea or vomiting.  Marland Kitchen PRESCRIPTION MEDICATION Chemo - [facility]  . PROAIR HFA 108 (90 BASE) MCG/ACT inhaler INHALE 2 PUFFS BY MOUTH FOUR TIMES DAILY AS NEEDED FOR WHEEZING  . prochlorperazine (COMPAZINE) 5 MG tablet Take 1 tablet (5 mg total) by mouth every 6 (six) hours as needed for nausea or vomiting.  . sodium chloride (OCEAN) 0.65 % SOLN nasal spray Place 1-2 sprays into both nostrils  daily as needed for congestion.   . SYMBICORT 160-4.5 MCG/ACT inhaler INHALE 2 PUFFS BY MOUTH TWICE DAILY  . Tiotropium Bromide Monohydrate (SPIRIVA RESPIMAT) 2.5 MCG/ACT AERS Inhale 2 puffs into the lungs daily.  Marland Kitchen triamterene-hydrochlorothiazide (MAXZIDE-25) 37.5-25 MG per tablet Take 1 tablet by mouth every morning.   . zaleplon (SONATA) 5 MG capsule Take 1 capsule (5 mg total) by mouth at bedtime as needed for sleep.   No facility-administered encounter medications on file as of 12/23/2014.    Allergy:  Allergies  Allergen Reactions  . Latex Rash  . Penicillins Other (See Comments)    welps    Social Hx:   History   Social History  . Marital Status: Married    Spouse Name: N/A  . Number of Children: 3  . Years of Education: N/A   Occupational History  . RETIRED    Social History Main Topics  . Smoking status: Former Smoker -- 1.00 packs/day for 40 years    Types: Cigarettes    Quit date: 07/10/2013  . Smokeless tobacco: Never Used     Comment: Pt states that she is has struggled with smoking since Jan--1 cig here and there 04/09/14  . Alcohol Use: Yes     Comment: social   . Drug Use: No  . Sexual Activity: Not on file   Other Topics Concern  . Not on file   Social History Narrative  Past Surgical Hx:  Past Surgical History  Procedure Laterality Date  . Nephrectomy    . Tonsilectomy, adenoidectomy, bilateral myringotomy and tubes    . Tubal ligation    . Hand surgery Right 1980's  . Thoracentesis      Past Medical Hx:  Past Medical History  Diagnosis Date  . H/O hydronephrosis   . Hypertension   . Emphysema   . COPD (chronic obstructive pulmonary disease)   . Cancer     ovarian    Past Gynecological History:  G3P3, tubal ligation.  No LMP recorded. Patient is postmenopausal.  Family Hx:  Family History  Problem Relation Age of Onset  . Liver cancer Father   . Diabetes Father   . Liver cancer Mother   . Diabetes Paternal Grandfather   .  Colon cancer Neg Hx   . Stomach cancer Neg Hx     Review of Systems:  Constitutional  Feels fatigued  ENT Normal appearing ears and nares bilaterally Skin/Breast  No rash, sores, jaundice, itching, dryness Cardiovascular  No chest pain, shortness of breath, or edema  Pulmonary  No cough or wheeze.  Gastro Intestinal  No nausea, vomitting, or diarrhoea. No bright red blood per rectum, no abdominal pain, change in bowel movement, or constipation. See HPI Genito Urinary  No frequency, urgency, dysuria, see HPI Musculo Skeletal  No myalgia, arthralgia, joint swelling or pain  Neurologic  No weakness, numbness, change in gait,  Psychology  No depression, anxiety, insomnia.   Vitals:  Blood pressure 138/59, pulse 106, temperature 98.8 F (37.1 C), temperature source Oral, resp. rate 18, height 5' (1.524 m), weight 174 lb 9.6 oz (79.198 kg), SpO2 97 %.  Physical Exam: WD in NAD Neck  Supple NROM, without any enlargements.  Lymph Node Survey No cervical supraclavicular or inguinal adenopathy Cardiovascular  Pulse normal rate, regularity and rhythm. S1 and S2 normal.  Lungs  Clear to auscultation bilateraly, without wheezes/crackles/rhonchi. Good air movement. No pleural effusion appreciated.  Skin  No rash/lesions/breakdown  Psychiatry  Alert and oriented to person, place, and time  Abdomen  Normoactive bowel sounds, abdomen soft, non-tender and overweight, distended with ascites and dull to percuss, without evidence of hernia.  Back No CVA tenderness Genito Urinary  Vulva/vagina: deferred Rectal  Good tone, no masses no cul de sac nodularity.  Extremities  No bilateral cyanosis, clubbing or edema.   Donaciano Eva, MD   12/23/2014, 4:04 PM

## 2014-12-29 NOTE — Op Note (Signed)
OPERATIVE NOTE 12/29/14  Surgeon: Donaciano Eva   Assistants: Lahoma Crocker, MD (an MD assistant was necessary for tissue manipulation, management of robotic instrumentation, retraction and positioning due to the complexity of the case and hospital policies).   Anesthesia: General endotracheal anesthesia  ASA Class: 2   Pre-operative Diagnosis: stage III primary peritoneal cancer, s/p 3 cycles of neoadjuvant chemotherapy  Post-operative Diagnosis: same  Operation: Robotic-assisted laparoscopic hysterectomy with bilateral salpingoophorectomy, omentectomy, radical tumor debulking  Surgeon: Donaciano Eva  Assistant Surgeon: Lahoma Crocker MD  Anesthesia: GET  Urine Output: 150  Operative Findings:  : omental cake measuring 10cm, predominantly in right upper quadrant. Dense tumor adhesions fixing sigmoid colon overlapping uterus, and bladder peritoneum also overlapping uterus. No normal pelvic anatomy visualized until after extensive pelvic lysis of tumor adhesions. No gross ovarian masses. Normal sized uterus. Bilateral ureters seen vermiculating. Too numerous to count <41mm nodules scattered over the entirity of the small bowel wall and peritoneum. Residual tumor at the completion of the case including millial tumor nodules on the surface of the intestines.  Estimated Blood Loss:  150cc      Total IV Fluids: 1,000 ml         Specimens: uterus, bilateral tubes and ovaries, omentum         Complications:  None; patient tolerated the procedure well.         Disposition: PACU - hemodynamically stable.  Procedure Details  The patient was seen in the Holding Room. The risks, benefits, complications, treatment options, and expected outcomes were discussed with the patient.  The patient concurred with the proposed plan, giving informed consent.  The site of surgery properly noted/marked. The patient was identified as Skeet Simmer and the procedure verified as a  Robotic-assisted hysterectomy with bilateral salpingo oophorectomy. A Time Out was held and the above information confirmed.  After induction of anesthesia, the patient was draped and prepped in the usual sterile manner. Pt was placed in supine position after anesthesia and draped and prepped in the usual sterile manner. The abdominal drape was placed after the CholoraPrep had been allowed to dry for 3 minutes.  Her arms were tucked to her side with all appropriate precautions.  Shoulder blocks were placed on the acromiums.  The patient was placed in the semi-lithotomy position in [place].  The perineum was prepped with Betadine.  Foley catheter was placed.  A sterile speculum was placed in the vagina.  The cervix was grasped with a single-tooth tenaculum and dilated with Kennon Rounds dilators.  The ZUMI uterine manipulator with a medium colpotomizer ring was placed without difficulty.  A pneum occluder balloon was placed over the manipulator.  A second time-out was performed.  OG tube placement was confirmed and to suction.    Procedure:  The patient was then draped in the normal manner.  Next, a 5 mm skin incision was made 1 cm below the subcostal margin in the midclavicular line.  The 5 mm Optiview port and scope was used for direct entry.  Opening pressure was under 10 mm CO2.  The abdomen was insufflated and the findings were noted as above.   At this point and all points during the procedure, the patient's intra-abdominal pressure did not exceed 15 mmHg. Next, an 77mm skin incision was made in the umbilicus and a right and left port was placed about 10 cm lateral to the robot port on the right and left side.  A fourth arm was placed in  the left lower quadrant 2 cm above and superior and medial to the anterior superior iliac spine.  All ports were placed under direct visualization.  The patient was placed in reverse trendelenberg and the robot was docked.  The omentum was elevated and the parietal  peritoneum was entered with sharp and monopolar dissection. Meticulous dissection with sharp and blunt dissection separated the left omentum and omental cake from the left transverse colon. It became apparent during the procedure that the right sided omental cake would not be able to be adequately visualized/mobilized with the robotic tools. At this point the omentectomy was suspended until the end of the procedure and minilaparotomy. The robot was undocked, the boom was spun 180 degrees. The patient was placed in steep Trendelenburg.  Bowel was away into the upper abdomen.  The robot was docked to face the pelvis.  For 30 minutes lysis of pelvic adhesions was performed to mobilize the sigmoid colon tumor adhesions from the uterus, tubes and ovaries. This was adherent from thin tumor plaque. The hysterectomy was started after the round ligament on the right side was incised and the retroperitoneum was entered and the pararectal space was developed.  The ureter was noted to be on the medial leaf of the broad ligament.  The peritoneum above the ureter was incised and stretched and the infundibulopelvic ligament was skeletonized, cauterized and cut.  The posterior peritoneum was taken down to the level of the KOH ring.  The anterior peritoneum was also taken down carefully, as tumor had caused the bladder to be adherent over the top of the uterine fundus. The bladder was dissected from the fundus and lower uterine segment, then the vesicouterine peritoneal plane was entered.  The bladder flap was created to the level of the KOH ring.  The uterine artery on the right side was skeletonized, cauterized and cut in the normal manner.  A similar procedure was performed on the left.  The sigmoid colon required substantial lysis of adhesions to free it from where it was densely adherent to the left pelvic sidewall. The colpotomy was made and the uterus, cervix, bilateral ovaries and tubes were amputated and delivered through  the vagina.  Pedicles were inspected and excellent hemostasis was achieved.    The colpotomy at the vaginal cuff was closed with Vicryl on a CT1 needle in a running manner.  Irrigation was used and excellent hemostasis was achieved.  At this point in the robotic procedure was completed.  Robotic instruments were removed under direct visulaization.  The robot was undocked.   A vertical midline 10cm incision was made in the upper abdomen to complete the omentectomy. The subcutaneous fat was incised with the bovie. The facia was incised vertically with the bovie, and the peritoneal cavity was entered sharply with the scalpel while insufflation was running to elevated the abdominal wall off of underlying visceral. The Alexis self retaining retractor was placed. The omentum was delivered through the skin incision. Using meticulous sharp and blunt dissection and the monopolar electrosurgical device, the omentum was carefully dissected free from the transverse colon and greater curvature of the stomach. The colon and stomach were then closely inspected and were noted to be hemostatic and in tact. The small intestine was run and two puncture sites were noted in the mid ileum, possibly a consequence of insertion of the laparoscopic instrumentation. These were closed with full thickness interrupted 3-0 vicryl sutures and interrupted imbricating 2-0 silk.   The midline fascial incision was closed with #  1 PDS. Experil was infiltrated into the suprafascial tissues. The left upper quadrant 10 mm port were closed with Vicryl on a UR-5 needle and the fascia was closed with 0 Vicryl on a UR-5 needle.  The skin was closed with 4-0 Vicryl in a subcuticular manner.  Dermabond was applied.  Sponge, lap and needle counts correct x 2.  The patient was taken to the recovery room in stable condition.  The vagina was swabbed with  minimal bleeding noted.   All instrument and needle counts were correct x  3.   The patient was  transferred to the recovery room in a stable condition.   Donaciano Eva, MD

## 2014-12-29 NOTE — Interval H&P Note (Signed)
History and Physical Interval Note:  12/29/2014 1:52 PM  Lynn Ramirez  has presented today for surgery, with the diagnosis of PERITONEAL CARCANOMA   The various methods of treatment have been discussed with the patient and family. After consideration of risks, benefits and other options for treatment, the patient has consented to  Procedure(s): ROBOTIC ASSISTED TOTAL LAPAROSCOPIC HYSTERECTOMY WITH BILATERAL SALPINGO OOPHORECTOMY AND OOMENTECTOMY  (Bilateral) POSSIBLE LAPAROTOMY (N/A) as a surgical intervention .  The patient's history has been reviewed, patient examined, no change in status, stable for surgery.  I have reviewed the patient's chart and labs.  Questions were answered to the patient's satisfaction.     Donaciano Eva

## 2014-12-29 NOTE — Anesthesia Postprocedure Evaluation (Signed)
  Anesthesia Post-op Note  Patient: Lynn Ramirez  Procedure(s) Performed: Procedure(s) (LRB): ROBOTIC ASSISTED TOTAL LAPAROSCOPIC HYSTERECTOMY WITH BILATERAL SALPINGO OOPHORECTOMY AND OOMENTECTOMY WITH RADICAL TUMOR DEBULKIING  (Bilateral)  LAPAROTOMY (N/A)  Patient Location: PACU  Anesthesia Type: General  Level of Consciousness: awake and alert   Airway and Oxygen Therapy: Patient Spontanous Breathing  Post-op Pain: mild  Post-op Assessment: Post-op Vital signs reviewed, Patient's Cardiovascular Status Stable, Respiratory Function Stable, Patent Airway and No signs of Nausea or vomiting  Last Vitals:  Filed Vitals:   12/29/14 1830  BP: 151/60  Pulse: 84  Temp:   Resp: 18    Post-op Vital Signs: stable   Complications: No apparent anesthesia complications

## 2014-12-29 NOTE — Transfer of Care (Signed)
Immediate Anesthesia Transfer of Care Note  Patient: Lynn Ramirez  Procedure(s) Performed: Procedure(s): ROBOTIC ASSISTED TOTAL LAPAROSCOPIC HYSTERECTOMY WITH BILATERAL SALPINGO OOPHORECTOMY AND OOMENTECTOMY WITH RADICAL TUMOR DEBULKIING  (Bilateral)  LAPAROTOMY (N/A)  Patient Location: PACU  Anesthesia Type:General  Level of Consciousness: awake, alert , oriented and patient cooperative  Airway & Oxygen Therapy: Patient Spontanous Breathing and Patient connected to face mask oxygen  Post-op Assessment: Report given to RN, Post -op Vital signs reviewed and stable and Patient moving all extremities  Post vital signs: Reviewed and stable  Last Vitals:  Filed Vitals:   12/29/14 1028  BP: 151/69  Pulse: 102  Temp: 36.8 C  Resp: 16    Complications: No apparent anesthesia complications

## 2014-12-29 NOTE — Anesthesia Procedure Notes (Signed)
Procedure Name: Intubation Date/Time: 12/29/2014 2:19 PM Performed by: Chandra Batch A Pre-anesthesia Checklist: Emergency Drugs available, Patient identified, Timeout performed, Suction available and Patient being monitored Patient Re-evaluated:Patient Re-evaluated prior to inductionOxygen Delivery Method: Circle system utilized Preoxygenation: Pre-oxygenation with 100% oxygen Intubation Type: IV induction Ventilation: Mask ventilation without difficulty Laryngoscope Size: Mac and 3 Grade View: Grade I Tube type: Oral Tube size: 7.5 mm Number of attempts: 1 Airway Equipment and Method: Stylet Placement Confirmation: ETT inserted through vocal cords under direct vision,  breath sounds checked- equal and bilateral and positive ETCO2 Secured at: 21 cm Tube secured with: Tape Dental Injury: Teeth and Oropharynx as per pre-operative assessment

## 2014-12-30 ENCOUNTER — Encounter (HOSPITAL_COMMUNITY): Payer: Self-pay | Admitting: Gynecologic Oncology

## 2014-12-30 ENCOUNTER — Ambulatory Visit: Payer: Medicare Other

## 2014-12-30 ENCOUNTER — Other Ambulatory Visit: Payer: Medicare Other

## 2014-12-30 DIAGNOSIS — C5701 Malignant neoplasm of right fallopian tube: Secondary | ICD-10-CM | POA: Diagnosis not present

## 2014-12-30 LAB — BASIC METABOLIC PANEL
Anion gap: 6 (ref 5–15)
BUN: 16 mg/dL (ref 6–20)
CALCIUM: 8.9 mg/dL (ref 8.9–10.3)
CHLORIDE: 100 mmol/L — AB (ref 101–111)
CO2: 32 mmol/L (ref 22–32)
CREATININE: 0.99 mg/dL (ref 0.44–1.00)
GFR calc Af Amer: >60 mL/min (ref >60)
GFR calc non Af Amer: 56 mL/min — ABNORMAL LOW (ref >60)
GLUCOSE: 169 mg/dL — AB (ref 65–99)
Potassium: 4.5 mmol/L (ref 3.5–5.1)
SODIUM: 138 mmol/L (ref 135–145)

## 2014-12-30 LAB — CBC
HEMATOCRIT: 27.1 % — AB (ref 36.0–46.0)
HEMOGLOBIN: 8.3 g/dL — AB (ref 12.0–15.0)
MCH: 31.3 pg (ref 26.0–34.0)
MCHC: 30.6 g/dL (ref 30.0–36.0)
MCV: 102.3 fL — ABNORMAL HIGH (ref 78.0–100.0)
Platelets: 120 10*3/uL — ABNORMAL LOW (ref 150–400)
RBC: 2.65 MIL/uL — ABNORMAL LOW (ref 3.87–5.11)
RDW: 24.8 % — ABNORMAL HIGH (ref 11.5–15.5)
WBC: 8 10*3/uL (ref 4.0–10.5)

## 2014-12-30 MED ORDER — OXYCODONE-ACETAMINOPHEN 5-325 MG PO TABS: 1.0000 | ORAL_TABLET | ORAL | Status: DC | PRN

## 2014-12-30 NOTE — Discharge Summary (Signed)
Physician Discharge Summary  Patient ID: Lynn Ramirez MRN: [id number] DOB/AGE: July 08, 1941 74 y.o.  Admit date: 12/29/2014 Discharge date: 12/30/2014  Admission Diagnoses: Primary peritoneal carcinomatosis  Discharge Diagnoses:  Principal Problem:   Primary peritoneal carcinomatosis Active Problems:   Ovarian cancer   Discharged Condition:  The patient is in good condition and stable for discharge.    Hospital Course: On 12/29/2014, the patient underwent the following: Procedure(s): ROBOTIC ASSISTED TOTAL LAPAROSCOPIC HYSTERECTOMY WITH BILATERAL SALPINGO OOPHORECTOMY AND OOMENTECTOMY WITH RADICAL TUMOR [place].  The postoperative course was uneventful.  She was discharged to home on postoperative day 1 tolerating a regular diet with minimal pain and voiding without difficulty.  She was given one dose of lovenox post-op per Dr. Delsa Sale and none at discharge.  Consults: None  Significant Diagnostic Studies: None  Treatments: surgery: see above  Discharge Exam: Blood pressure 115/55, pulse 86, temperature 98.1 F (36.7 C), temperature source Oral, resp. rate 16, height 5' (1.524 m), weight 173 lb (78.472 kg), SpO2 100 %.  93% on RA General appearance: alert, cooperative and no distress Resp: clear to auscultation bilaterally Cardio: regular rate and rhythm, S1, S2 normal, no murmur, click, rub or gallop GI: soft, non-tender; bowel sounds normal; no masses,  no organomegaly Extremities: extremities normal, atraumatic, no cyanosis or edema Incision/Wound: Lap sites with dermabond without erythema or drainage, mini laparotomy incision with honeycomb dressing intact  Disposition: 01-Home or Self Care  Discharge Instructions    Call MD for:  difficulty breathing, headache or visual disturbances    Complete by:  As directed      Call MD for:  extreme fatigue    Complete by:  As directed      Call MD for:  hives    Complete by:  As directed      Call MD for:   persistant dizziness or light-headedness    Complete by:  As directed      Call MD for:  persistant nausea and vomiting    Complete by:  As directed      Call MD for:  redness, tenderness, or signs of infection (pain, swelling, redness, odor or green/yellow discharge around incision site)    Complete by:  As directed      Call MD for:  severe uncontrolled pain    Complete by:  As directed      Call MD for:  temperature >100.4    Complete by:  As directed      Diet - low sodium heart healthy    Complete by:  As directed      Driving Restrictions    Complete by:  As directed   No driving for 2 weeks.  Do not take narcotics and drive.     Increase activity slowly    Complete by:  As directed      Lifting restrictions    Complete by:  As directed   No lifting greater than 10 lbs.     Sexual Activity Restrictions    Complete by:  As directed   No sexual activity, nothing in the vagina, for 8 weeks.            Medication List    TAKE these medications        acetaminophen 325 MG tablet  Commonly known as:  TYLENOL  Take 650 mg by mouth every 6 (six) hours as needed (for pain).     antiseptic oral rinse Liqd  15 mLs by Mouth Rinse route  3 (three) times daily.     Biotin 2500 MCG Caps  Take 1 tablet by mouth daily.     CALCIUM 600 + D PO  Take 1 tablet by mouth daily.     cholecalciferol 1000 UNITS tablet  Commonly known as:  VITAMIN D  Take 1,000 Units by mouth every morning.     fish oil-omega-3 fatty acids 1000 MG capsule  Take 1 g by mouth every morning.     lidocaine-prilocaine cream  Commonly known as:  EMLA  Apply to port site one hour prior to use. Do not rub in. Cover with plastic.     loratadine 10 MG tablet  Commonly known as:  CLARITIN  Take 10 mg by mouth at bedtime.     ondansetron 4 MG disintegrating tablet  Commonly known as:  ZOFRAN ODT  Take 1 tablet (4 mg total) by mouth every 8 (eight) hours as needed for nausea or vomiting.      oxyCODONE-acetaminophen 5-325 MG per tablet  Commonly known as:  PERCOCET/ROXICET  Take 1-2 tablets by mouth every 4 (four) hours as needed (moderate to severe pain).     PEPCID AC PO  Take 1 tablet by mouth daily.     PRESCRIPTION MEDICATION  [facility] - [facility]     PROAIR HFA 108 (90 BASE) MCG/ACT inhaler  Generic drug:  albuterol  INHALE 2 PUFFS BY MOUTH FOUR TIMES DAILY AS NEEDED FOR WHEEZING     prochlorperazine 5 MG tablet  Commonly known as:  COMPAZINE  Take 1 tablet (5 mg total) by mouth every 6 (six) hours as needed for nausea or vomiting.     SYMBICORT 160-4.5 MCG/ACT inhaler  Generic drug:  budesonide-formoterol  INHALE 2 PUFFS BY MOUTH TWICE DAILY     Tiotropium Bromide Monohydrate 2.5 MCG/ACT Aers  Commonly known as:  SPIRIVA RESPIMAT  Inhale 2 puffs into the lungs daily.     triamterene-hydrochlorothiazide 37.5-25 MG per tablet  Commonly known as:  MAXZIDE-25  Take 1 tablet by mouth every morning.     zaleplon 5 MG capsule  Commonly known as:  SONATA  Take 1 capsule (5 mg total) by mouth at bedtime as needed for sleep.           Follow-up Information    Follow up with Donaciano Eva, MD On 01/14/2015.   Specialty:  Obstetrics and Gynecology   Why:  at 9:30am at the [facility] information:   [place] [address] [phone]       Greater than thirty minutes were spend for face to face discharge instructions and discharge orders/summary in EPIC.   Signed: CROSS, MELISSA DEAL 12/30/2014, 9:19 AM

## 2014-12-30 NOTE — Discharge Instructions (Signed)
12/30/2014  Return to work: 4-6 weeks if applicable  Activity: 1. Be up and out of the bed during the day.  Take a nap if needed.  You may walk up steps but be careful and use the hand rail.  Stair climbing will tire you more than you think, you may need to stop part way and rest.   2. No lifting or straining for 6 weeks.  3. No driving for 2 week(s).  Do not drive if you are taking narcotic pain medicine.  4. Shower daily.  Use soap and water on your incision and pat dry; don't rub.  No tub baths until cleared by your surgeon.   5. No sexual activity and nothing in the vagina for 8 weeks.  Diet: 1. Low sodium Heart Healthy Diet is recommended.  2. It is safe to use a laxative, such as Miralax or Colace, if you have difficulty moving your bowels.   Wound Care: 1. Keep clean and dry.  Shower daily.  REMOVE THE DRESSING FROM YOUR ABDOMEN IN 6 DAYS FROM SURGERY.  Reasons to call the Doctor:  Fever - Oral temperature greater than 100.4 degrees Fahrenheit  Foul-smelling vaginal discharge  Difficulty urinating  Nausea and vomiting  Increased pain at the site of the incision that is unrelieved with pain medicine.  Difficulty breathing with or without chest pain  New calf pain especially if only on one side  Sudden, continuing increased vaginal bleeding with or without clots.   Contacts: For questions or concerns you should contact:  Dr. Everitt Amber at [phone]  Joylene John, NP at [phone]  After Hours: call [phone] and ask for the GYN Oncologist on call  Acetaminophen; Oxycodone tablets What is this medicine? ACETAMINOPHEN; OXYCODONE (a set a MEE noe fen; ox i KOE done) is a pain reliever. It is used to treat mild to moderate pain. This medicine may be used for other purposes; ask your health care provider or pharmacist if you have questions. COMMON BRAND NAME(S): Endocet, Magnacet, Narvox, Percocet, Perloxx, Primalev, Primlev, Roxicet, Xolox What should  I tell my health care provider before I take this medicine? They need to know if you have any of these conditions: -brain tumor -Crohn's disease, inflammatory bowel disease, or ulcerative colitis -drug abuse or addiction -head injury -heart or circulation problems -if you often drink alcohol -kidney disease or problems going to the bathroom -liver disease -lung disease, asthma, or breathing problems -an unusual or allergic reaction to acetaminophen, oxycodone, other opioid analgesics, other medicines, foods, dyes, or preservatives -pregnant or trying to get pregnant -breast-feeding How should I use this medicine? Take this medicine by mouth with a full glass of water. Follow the directions on the prescription label. Take your medicine at regular intervals. Do not take your medicine more often than directed. Talk to your pediatrician regarding the use of this medicine in children. Special care may be needed. Patients over 36 years old may have a stronger reaction and need a smaller dose. Overdosage: If you think you have taken too much of this medicine contact a poison control center or emergency room at once. NOTE: This medicine is only for you. Do not share this medicine with others. What if I miss a dose? If you miss a dose, take it as soon as you can. If it is almost time for your next dose, take only that dose. Do not take double or extra doses. What may interact with this medicine? -alcohol -antihistamines -barbiturates like amobarbital, butalbital,  butabarbital, methohexital, pentobarbital, phenobarbital, thiopental, and secobarbital -benztropine -drugs for bladder problems like solifenacin, trospium, oxybutynin, tolterodine, hyoscyamine, and methscopolamine -drugs for breathing problems like ipratropium and tiotropium -drugs for certain stomach or intestine problems like propantheline, homatropine methylbromide, glycopyrrolate, atropine, belladonna, and dicyclomine -general  anesthetics like etomidate, ketamine, nitrous oxide, propofol, desflurane, enflurane, halothane, isoflurane, and sevoflurane -medicines for depression, anxiety, or psychotic disturbances -medicines for sleep -muscle relaxants -naltrexone -narcotic medicines (opiates) for pain -phenothiazines like perphenazine, thioridazine, chlorpromazine, mesoridazine, fluphenazine, prochlorperazine, promazine, and trifluoperazine -scopolamine -tramadol -trihexyphenidyl This list may not describe all possible interactions. Give your health care provider a list of all the medicines, herbs, non-prescription drugs, or dietary supplements you use. Also tell them if you smoke, drink alcohol, or use illegal drugs. Some items may interact with your medicine. What should I watch for while using this medicine? Tell your doctor or health care professional if your pain does not go away, if it gets worse, or if you have new or a different type of pain. You may develop tolerance to the medicine. Tolerance means that you will need a higher dose of the medication for pain relief. Tolerance is normal and is expected if you take this medicine for a long time. Do not suddenly stop taking your medicine because you may develop a severe reaction. Your body becomes used to the medicine. This does NOT mean you are addicted. Addiction is a behavior related to getting and using a drug for a non-medical reason. If you have pain, you have a medical reason to take pain medicine. Your doctor will tell you how much medicine to take. If your doctor wants you to stop the medicine, the dose will be slowly lowered over time to avoid any side effects. You may get drowsy or dizzy. Do not drive, use machinery, or do anything that needs mental alertness until you know how this medicine affects you. Do not stand or sit up quickly, especially if you are an older patient. This reduces the risk of dizzy or fainting spells. Alcohol may interfere with the  effect of this medicine. Avoid alcoholic drinks. There are different types of narcotic medicines (opiates) for pain. If you take more than one type at the same time, you may have more side effects. Give your health care provider a list of all medicines you use. Your doctor will tell you how much medicine to take. Do not take more medicine than directed. Call emergency for help if you have problems breathing. The medicine will cause constipation. Try to have a bowel movement at least every 2 to 3 days. If you do not have a bowel movement for 3 days, call your doctor or health care professional. Do not take Tylenol (acetaminophen) or medicines that have acetaminophen with this medicine. Too much acetaminophen can be very dangerous. Many nonprescription medicines contain acetaminophen. Always read the labels carefully to avoid taking more acetaminophen. What side effects may I notice from receiving this medicine? Side effects that you should report to your doctor or health care professional as soon as possible: -allergic reactions like skin rash, itching or hives, swelling of the face, lips, or tongue -breathing difficulties, wheezing -confusion -light headedness or fainting spells -severe stomach pain -unusually weak or tired -yellowing of the skin or the whites of the eyes Side effects that usually do not require medical attention (report to your doctor or health care professional if they continue or are bothersome): -dizziness -drowsiness -nausea -vomiting This list may not describe all possible  side effects. Call your doctor for medical advice about side effects. You may report side effects to FDA at 1-800-FDA-1088. Where should I keep my medicine? Keep out of the reach of children. This medicine can be abused. Keep your medicine in a safe place to protect it from theft. Do not share this medicine with anyone. Selling or giving away this medicine is dangerous and against the law. Store at room  temperature between 20 and 25 degrees C (68 and 77 degrees F). Keep container tightly closed. Protect from light. This medicine may cause accidental overdose and death if it is taken by other adults, children, or pets. Flush any unused medicine down the toilet to reduce the chance of harm. Do not use the medicine after the expiration date. NOTE: This sheet is a summary. It may not cover all possible information. If you have questions about this medicine, talk to your doctor, pharmacist, or health care provider.  2015, [employer]/Gold Standard. (2013-02-17 13:17:35)  Abdominal Hysterectomy, Care After These instructions give you information on caring for yourself after your procedure. Your doctor may also give you more specific instructions. Call your doctor if you have any problems or questions after your procedure.  HOME CARE It takes 4-6 weeks to recover from this surgery. Follow all of your doctor's instructions.   Only take medicines as told by your doctor.  Change your bandage as told by your doctor.  Return to your doctor to have your stitches taken out.  Take showers for 2-3 weeks. Ask your doctor when it is okay to shower.  Do not douche, use tampons, or have sex (intercourse) for at least 6 weeks or as told.  Follow your doctor's advice about exercise, lifting objects, driving, and general activities.  Get plenty of rest and sleep.  Do not lift anything heavier than a gallon of milk (about 10 pounds [4.5 kilograms]) for the first month after surgery.  Get back to your normal diet as told by your doctor.  Do not drink alcohol until your doctor says it is okay.  Take a medicine to help you poop (laxative) as told by your doctor.  Eating foods high in fiber may help you poop. Eat a lot of raw fruits and vegetables, whole grains, and beans.  Drink enough fluids to keep your pee (urine) clear or pale yellow.  Have someone help you at home for 1-2 weeks after your  surgery.  Keep follow-up doctor visits as told. GET HELP IF:  You have chills or fever.  You have puffiness, redness, or pain in area of the cut (incision).  You have yellowish-white fluid (pus) coming from the cut.  You have a bad smell coming from the cut or bandage.  Your cut pulls apart.  You feel dizzy or light-headed.  You have pain or bleeding when you pee.  You keep having watery poop (diarrhea).  You keep feeling sick to your stomach (nauseous) or keep throwing up (vomiting).  You have fluid (discharge) coming from your vagina.  You have a rash.  You have a reaction to your medicine.  You need stronger pain medicine. GET HELP RIGHT AWAY IF:   You have a fever and your symptoms suddenly get worse.  You have bad belly (abdominal) pain.  You have chest pain.  You are short of breath.  You pass out (faint).  You have pain, puffiness, or redness of your leg.  You bleed a lot from your vagina and notice clumps of tissue (clots).  MAKE SURE YOU:   Understand these instructions.  Will watch your condition.  Will get help right away if you are not doing well or get worse. Document Released: 04/04/2008 Document Revised: 07/01/2013 Document Reviewed: 04/18/2013 [facility] Patient Information 2015 [place], [place]. This information is not intended to replace advice given to you by your health care provider. Make sure you discuss any questions you have with your health care provider.

## 2015-01-02 LAB — TYPE AND SCREEN
ABO/RH(D): O NEG
Antibody Screen: NEGATIVE
Unit division: 0
Unit division: 0

## 2015-01-04 ENCOUNTER — Telehealth: Payer: Self-pay | Admitting: *Deleted

## 2015-01-04 NOTE — Telephone Encounter (Signed)
Pt called to confirm when she can change dressing and steri strips.Discussed with pt steri strips can be taken off as they peel off, dressing change- apply 4x4 gauze to keep dry and clean. Monitor for drainage, redness, s/s of infection. Pt verbalized understanding.

## 2015-01-14 ENCOUNTER — Encounter: Payer: Self-pay | Admitting: Gynecologic Oncology

## 2015-01-14 ENCOUNTER — Ambulatory Visit: Payer: Medicare Other | Attending: Gynecologic Oncology | Admitting: Gynecologic Oncology

## 2015-01-14 VITALS — BP 126/75 | HR 95 | Temp 97.8°F | Resp 20 | Ht 60.0 in | Wt 172.3 lb

## 2015-01-14 DIAGNOSIS — C569 Malignant neoplasm of unspecified ovary: Secondary | ICD-10-CM

## 2015-01-14 DIAGNOSIS — Z7189 Other specified counseling: Secondary | ICD-10-CM | POA: Diagnosis not present

## 2015-01-14 NOTE — Progress Notes (Signed)
Gyn Onc Follow-up Note: Gyn-Onc  Consult was originally requested by Dr. Benay Spice for the evaluation of Lynn Ramirez 74 y.o. 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  CC:  Chief Complaint  Patient presents with  . Ovarian Cancer    Assessment/Plan:  Lynn Ramirez  is a 74 y.o.  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 I spent 30 minutes in face to face counseling with the patient and her husband regarding her surgical findings, pathology findings, disease prognosis and recommendations for genetic counseling (BRCA testing).   Patient is cleared to resume adjuvant chemotherapy with at least 3 additional cycles of carboplatin and paclitaxel which she is tolerating well and demosntrating good response. Recommend obtaining imaging and CA 125 at completion of 3 additional cycles. If NED on imaging, labs and exam, recommend discontinuing adjuvant therapy and entering surveillance. Referral made to genetics counselor regarding BRCA testing.  Will evaluate SUI symptoms after chemotherapy and consider urology referal if they are impacting her QOL.  HPI: Lynn Ramirez is a very pleasant 74 year old woman (G3 P3) who is seen in consultation at the request of Dr. 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  The patient reports an episode of dyspnea requiring a chest x-ray in March 2016. She underwent chest x-ray which revealed a large right pleural effusion. An ultrasound guided thoracentesis on 09/30/2014 extracted 1.6 L of bloody fluid and cytology revealed carcinoma consistent on immunostains with gynecologic primary.  The CT scan showed a large omental cake, but normal ovaries and uterus. There was moderate ascites throughout the abdomen. He was a large right pleural effusion. There was  some thickening of the small bowel mesentery and bowel wall.  She underwent CA-125 evaluation on 10/14/2014 and this was elevated at 4121 units per milliliter.  Of note she had a colonoscopy in [place] 2016 but there was no ability to visualize beyond the sigmoid colon secondary to sigmoid colon spasming. This had happened 10 years previously. Therefore the patient has never had a colonoscopy take evaluation of her colon proximal to the sigmoid.  She has a history of a left nephrectomy for congenital abnormality.  Interval History:   She  Underwent 3 cycles of carboplatin and paclitaxel chemotherapy with Dr. Benay Spice with her third cycle being administered on 12/09/2014. CT imaging of the abdomen and pelvis to monitor for response was performed on 12/23/2014 and revealed: IMPRESSION: 1. Interval improvement in peritoneal carcinomatosis with near-complete resolution of ascites and decreased omental nodularity. 2. Significant improvement in malignant right pleural effusion. A small mildly complex right pleural effusion remains. 3. No other evidence of metastatic disease. No adnexal mass identified. 4. Stable cholelithiasis, left adrenal adenoma and atherosclerosis  CA 125 was initially elevated at 4121 in April at commencement of chemotherapy. It was decreased to 337U/mL at cycle 3.  On 12/29/14 she underwent a robotic assisted hysterectomy, BSO, omentectomy and radical tumor debulking. Disease was found in an omental cake, millial tumor deposits on the small intestine and in the ovaries and fallopian tubes. She required a minilaparotomy for resection of the omental cake. Cytoreduction was optimal with residual disease remaining only in the 24m implants on the small intestines. She had an uncomplicated postoperative course.  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 It involved both ovaries and the omentum.  She has had no  complaints  postoperatively and is healing well, off pain medications, returning to regular activity and has normal bowel habit. She has persistent stress urinary incontinence postoperatively.  She was not treated with prolonged postop lovenox because of thrombocytopenia secondary to recent chemotherapy.  Current Meds:  Outpatient Encounter Prescriptions as of 01/14/2015  Medication Sig  . acetaminophen (TYLENOL) 325 MG tablet Take 650 mg by mouth every 6 (six) hours as needed (for pain).   Marland Kitchen antiseptic oral rinse (BIOTENE) LIQD 15 mLs by Mouth Rinse route 3 (three) times daily.  . Biotin 2500 MCG CAPS Take 1 tablet by mouth daily.  . Calcium Carb-Cholecalciferol (CALCIUM 600 + D PO) Take 1 tablet by mouth daily.  . cholecalciferol (VITAMIN D) 1000 UNITS tablet Take 1,000 Units by mouth every morning.   . Famotidine (PEPCID AC PO) Take 1 tablet by mouth daily.  . fish oil-omega-3 fatty acids 1000 MG capsule Take 1 g by mouth every morning.   . lidocaine-prilocaine (EMLA) cream Apply to port site one hour prior to use. Do not rub in. Cover with plastic.  Marland Kitchen loratadine (CLARITIN) 10 MG tablet Take 10 mg by mouth at bedtime.   . ondansetron (ZOFRAN ODT) 4 MG disintegrating tablet Take 1 tablet (4 mg total) by mouth every 8 (eight) hours as needed for nausea or vomiting.  Marland Kitchen PRESCRIPTION MEDICATION Chemo - [facility]  . PROAIR HFA 108 (90 BASE) MCG/ACT inhaler INHALE 2 PUFFS BY MOUTH FOUR TIMES DAILY AS NEEDED FOR WHEEZING  . prochlorperazine (COMPAZINE) 5 MG tablet Take 1 tablet (5 mg total) by mouth every 6 (six) hours as needed for nausea or vomiting.  . SYMBICORT 160-4.5 MCG/ACT inhaler INHALE 2 PUFFS BY MOUTH TWICE DAILY  . Tiotropium Bromide Monohydrate (SPIRIVA RESPIMAT) 2.5 MCG/ACT AERS Inhale 2 puffs into the lungs daily.  Marland Kitchen triamterene-hydrochlorothiazide (MAXZIDE-25) 37.5-25 MG per tablet Take 1 tablet by mouth every morning.   . zaleplon (SONATA) 5 MG capsule Take 1 capsule (5 mg total) by mouth at bedtime  as needed for sleep.  Marland Kitchen oxyCODONE-acetaminophen (PERCOCET/ROXICET) 5-325 MG per tablet Take 1-2 tablets by mouth every 4 (four) hours as needed (moderate to severe pain). (Patient not taking: Reported on 01/14/2015)   No facility-administered encounter medications on file as of 01/14/2015.    Allergy:  Allergies  Allergen Reactions  . Latex Rash  . Penicillins Other (See Comments)    welps    Social Hx:   History   Social History  . Marital Status: Married    Spouse Name: N/A  . Number of Children: 3  . Years of Education: N/A   Occupational History  . RETIRED    Social History Main Topics  . Smoking status: Former Smoker -- 1.00 packs/day for 40 years    Types: Cigarettes    Quit date: 07/10/2013  . Smokeless tobacco: Never Used     Comment: Pt states that she is has struggled with smoking since Jan--1 cig here and there 04/09/14  . Alcohol Use: Yes     Comment: social   . Drug Use: No  . Sexual Activity: Not on file   Other Topics Concern  . Not on file   Social History Narrative    Past Surgical Hx:  Past Surgical History  Procedure Laterality Date  . Nephrectomy    . Tonsilectomy, adenoidectomy, bilateral myringotomy and tubes    . Tubal ligation    . Hand surgery Right 1980's  . Thoracentesis      several  .  Robotic assisted total hysterectomy with bilateral salpingo oopherectomy Bilateral 12/29/2014    Procedure: ROBOTIC ASSISTED TOTAL LAPAROSCOPIC HYSTERECTOMY WITH BILATERAL SALPINGO OOPHORECTOMY AND OOMENTECTOMY WITH RADICAL TUMOR [place] ;  Surgeon: Everitt Amber, MD;  Location: [facility];  Service: Gynecology;  Laterality: Bilateral;  . Laparotomy N/A 12/29/2014    Procedure:  LAPAROTOMY;  Surgeon: Everitt Amber, MD;  Location: [facility];  Service: Gynecology;  Laterality: N/A;    Past Medical Hx:  Past Medical History  Diagnosis Date  . H/O hydronephrosis   . Hypertension   . Emphysema   . COPD (chronic obstructive pulmonary disease)   . Cancer     ovarian   . Eczema     hands  . GERD (gastroesophageal reflux disease)   . History of transfusion     age 9  . Difficulty sleeping     Past Gynecological History:  G3P3, tubal ligation.  No LMP recorded. Patient is postmenopausal.  Family Hx:  Family History  Problem Relation Age of Onset  . Liver cancer Father   . Diabetes Father   . Liver cancer Mother   . Diabetes Paternal Grandfather   . Colon cancer Neg Hx   . Stomach cancer Neg Hx     Review of Systems:  Constitutional  Feels fatigued  ENT Normal appearing ears and nares bilaterally Skin/Breast  No rash, sores, jaundice, itching, dryness Cardiovascular  No chest pain, shortness of breath, or edema  Pulmonary  No cough or wheeze.  Gastro Intestinal  No nausea, vomitting, or diarrhoea. No bright red blood per rectum, no abdominal pain, change in bowel movement, or constipation. See HPI Genito Urinary  No frequency, urgency, dysuria, see HPI Musculo Skeletal  No myalgia, arthralgia, joint swelling or pain  Neurologic  No weakness, numbness, change in gait,  Psychology  No depression, anxiety, insomnia.   Vitals:  Blood pressure 126/75, pulse 95, temperature 97.8 F (36.6 C), temperature source Oral, resp. rate 20, height 5' (1.524 m), weight 172 lb 4.8 oz (78.155 kg), SpO2 95 %.  Physical Exam: WD in NAD Neck  Supple NROM, without any enlargements.  Lymph Node Survey No cervical supraclavicular or inguinal adenopathy Cardiovascular  Pulse normal rate, regularity and rhythm. S1 and S2 normal.  Lungs  Clear to auscultation bilateraly, without wheezes/crackles/rhonchi. Good air movement. No pleural effusion appreciated.  Skin  No rash/lesions/breakdown  Psychiatry  Alert and oriented to person, place, and time  Abdomen  Normoactive bowel sounds, abdomen soft, non-tender and overweight, no distention, without evidence of hernia. Incisions healing well with no signs of infection. Back No CVA tenderness Genito  Urinary : vaginal cuff healing normally Extremities  No bilateral cyanosis, clubbing or edema.   Donaciano Eva, MD   01/14/2015, 10:24 AM

## 2015-01-14 NOTE — Patient Instructions (Addendum)
You will receive a phone call from the [facility] with your appt with the Genetics Counselor.  Plan to follow up with Dr. Denman George after your completion of your third cycle of chemotherapy.  Dr. Benay Spice will arrange for a CT scan prior to our appointment as well.  Please call for any questions or concerns.

## 2015-01-18 ENCOUNTER — Other Ambulatory Visit: Payer: Self-pay | Admitting: *Deleted

## 2015-01-18 MED ORDER — TIOTROPIUM BROMIDE MONOHYDRATE 2.5 MCG/ACT IN AERS: 2.0000 | INHALATION_SPRAY | Freq: Every day | RESPIRATORY_TRACT | Status: DC

## 2015-01-20 ENCOUNTER — Ambulatory Visit: Payer: Medicare Other

## 2015-01-20 ENCOUNTER — Ambulatory Visit (HOSPITAL_BASED_OUTPATIENT_CLINIC_OR_DEPARTMENT_OTHER): Payer: Medicare Other | Admitting: Nurse Practitioner

## 2015-01-20 ENCOUNTER — Other Ambulatory Visit (HOSPITAL_BASED_OUTPATIENT_CLINIC_OR_DEPARTMENT_OTHER): Payer: Medicare Other

## 2015-01-20 ENCOUNTER — Ambulatory Visit (HOSPITAL_BASED_OUTPATIENT_CLINIC_OR_DEPARTMENT_OTHER): Payer: Medicare Other

## 2015-01-20 ENCOUNTER — Telehealth: Payer: Self-pay | Admitting: Nurse Practitioner

## 2015-01-20 ENCOUNTER — Telehealth: Payer: Self-pay | Admitting: *Deleted

## 2015-01-20 VITALS — BP 146/69 | HR 80 | Temp 97.7°F | Resp 18 | Ht 60.0 in | Wt 173.0 lb

## 2015-01-20 DIAGNOSIS — C482 Malignant neoplasm of peritoneum, unspecified: Secondary | ICD-10-CM

## 2015-01-20 DIAGNOSIS — Z452 Encounter for adjustment and management of vascular access device: Secondary | ICD-10-CM

## 2015-01-20 DIAGNOSIS — C562 Malignant neoplasm of left ovary: Secondary | ICD-10-CM

## 2015-01-20 DIAGNOSIS — C799 Secondary malignant neoplasm of unspecified site: Secondary | ICD-10-CM

## 2015-01-20 DIAGNOSIS — C561 Malignant neoplasm of right ovary: Secondary | ICD-10-CM

## 2015-01-20 DIAGNOSIS — C801 Malignant (primary) neoplasm, unspecified: Secondary | ICD-10-CM | POA: Diagnosis not present

## 2015-01-20 DIAGNOSIS — C5701 Malignant neoplasm of right fallopian tube: Secondary | ICD-10-CM

## 2015-01-20 DIAGNOSIS — C569 Malignant neoplasm of unspecified ovary: Secondary | ICD-10-CM | POA: Diagnosis not present

## 2015-01-20 DIAGNOSIS — C7989 Secondary malignant neoplasm of other specified sites: Secondary | ICD-10-CM | POA: Diagnosis not present

## 2015-01-20 DIAGNOSIS — C786 Secondary malignant neoplasm of retroperitoneum and peritoneum: Secondary | ICD-10-CM | POA: Diagnosis not present

## 2015-01-20 DIAGNOSIS — J910 Malignant pleural effusion: Secondary | ICD-10-CM | POA: Diagnosis not present

## 2015-01-20 DIAGNOSIS — Z5111 Encounter for antineoplastic chemotherapy: Secondary | ICD-10-CM

## 2015-01-20 DIAGNOSIS — Z95828 Presence of other vascular implants and grafts: Secondary | ICD-10-CM

## 2015-01-20 LAB — COMPREHENSIVE METABOLIC PANEL (CC13)
ALT: 15 U/L (ref 0–55)
AST: 13 U/L (ref 5–34)
Albumin: 3.7 g/dL (ref 3.5–5.0)
Alkaline Phosphatase: 83 U/L (ref 40–150)
Anion Gap: 8 mEq/L (ref 3–11)
BILIRUBIN TOTAL: 0.3 mg/dL (ref 0.20–1.20)
BUN: 27.3 mg/dL — ABNORMAL HIGH (ref 7.0–26.0)
CALCIUM: 10.2 mg/dL (ref 8.4–10.4)
CHLORIDE: 99 meq/L (ref 98–109)
CO2: 33 mEq/L — ABNORMAL HIGH (ref 22–29)
Creatinine: 1 mg/dL (ref 0.6–1.1)
EGFR: 54 mL/min/{1.73_m2} — ABNORMAL LOW (ref >90)
GLUCOSE: 114 mg/dL (ref 70–140)
Potassium: 3.8 mEq/L (ref 3.5–5.1)
Sodium: 140 mEq/L (ref 136–145)
Total Protein: 7.5 g/dL (ref 6.4–8.3)

## 2015-01-20 LAB — CBC WITH DIFFERENTIAL/PLATELET
BASO%: 1 % (ref 0.0–2.0)
BASOS ABS: 0 10*3/uL (ref 0.0–0.1)
EOS ABS: 0.2 10*3/uL (ref 0.0–0.5)
EOS%: 4 % (ref 0.0–7.0)
HCT: 32.6 % — ABNORMAL LOW (ref 34.8–46.6)
HGB: 10.7 g/dL — ABNORMAL LOW (ref 11.6–15.9)
LYMPH#: 1 10*3/uL (ref 0.9–3.3)
LYMPH%: 20.7 % (ref 14.0–49.7)
MCH: 32.6 pg (ref 25.1–34.0)
MCHC: 32.7 g/dL (ref 31.5–36.0)
MCV: 99.6 fL (ref 79.5–101.0)
MONO#: 0.4 10*3/uL (ref 0.1–0.9)
MONO%: 8.5 % (ref 0.0–14.0)
NEUT%: 65.8 % (ref 38.4–76.8)
NEUTROS ABS: 3.1 10*3/uL (ref 1.5–6.5)
Platelets: 311 10*3/uL (ref 145–400)
RBC: 3.27 10*6/uL — ABNORMAL LOW (ref 3.70–5.45)
RDW: 22.4 % — ABNORMAL HIGH (ref 11.2–14.5)
WBC: 4.6 10*3/uL (ref 3.9–10.3)

## 2015-01-20 MED ORDER — PALONOSETRON HCL INJECTION 0.25 MG/5ML
0.2500 mg | Freq: Once | INTRAVENOUS | Status: AC
  Administered 2015-01-20: 0.25 mg via INTRAVENOUS

## 2015-01-20 MED ORDER — SODIUM CHLORIDE 0.9 % IV SOLN
Freq: Once | INTRAVENOUS | Status: AC
  Administered 2015-01-20: 11:00:00 via INTRAVENOUS

## 2015-01-20 MED ORDER — FAMOTIDINE IN NACL 20-0.9 MG/50ML-% IV SOLN
INTRAVENOUS | Status: AC
  Filled 2015-01-20: qty 50

## 2015-01-20 MED ORDER — PACLITAXEL CHEMO INJECTION 300 MG/50ML
175.0000 mg/m2 | Freq: Once | INTRAVENOUS | Status: AC
  Administered 2015-01-20: 330 mg via INTRAVENOUS
  Filled 2015-01-20: qty 55

## 2015-01-20 MED ORDER — SODIUM CHLORIDE 0.9 % IJ SOLN
10.0000 mL | INTRAMUSCULAR | Status: DC | PRN
  Administered 2015-01-20: 10 mL
  Filled 2015-01-20: qty 10

## 2015-01-20 MED ORDER — DIPHENHYDRAMINE HCL 50 MG/ML IJ SOLN
INTRAMUSCULAR | Status: AC
  Filled 2015-01-20: qty 1

## 2015-01-20 MED ORDER — PALONOSETRON HCL INJECTION 0.25 MG/5ML
INTRAVENOUS | Status: AC
  Filled 2015-01-20: qty 5

## 2015-01-20 MED ORDER — LORAZEPAM 1 MG PO TABS
ORAL_TABLET | ORAL | Status: AC
  Filled 2015-01-20: qty 1

## 2015-01-20 MED ORDER — DIPHENHYDRAMINE HCL 50 MG/ML IJ SOLN
25.0000 mg | Freq: Once | INTRAMUSCULAR | Status: AC
  Administered 2015-01-20: 12.5 mg via INTRAVENOUS

## 2015-01-20 MED ORDER — LORAZEPAM 1 MG PO TABS
0.5000 mg | ORAL_TABLET | Freq: Once | ORAL | Status: AC
  Administered 2015-01-20: 0.5 mg via ORAL

## 2015-01-20 MED ORDER — ALTEPLASE 2 MG IJ SOLR
2.0000 mg | Freq: Once | INTRAMUSCULAR | Status: AC | PRN
  Administered 2015-01-20: 2 mg
  Filled 2015-01-20: qty 2

## 2015-01-20 MED ORDER — SODIUM CHLORIDE 0.9 % IV SOLN
462.5000 mg | Freq: Once | INTRAVENOUS | Status: AC
  Administered 2015-01-20: 460 mg via INTRAVENOUS
  Filled 2015-01-20: qty 46

## 2015-01-20 MED ORDER — HEPARIN SOD (PORK) LOCK FLUSH 100 UNIT/ML IV SOLN
500.0000 [IU] | Freq: Once | INTRAVENOUS | Status: AC | PRN
  Administered 2015-01-20: 500 [IU]
  Filled 2015-01-20: qty 5

## 2015-01-20 MED ORDER — SODIUM CHLORIDE 0.9 % IJ SOLN
10.0000 mL | INTRAMUSCULAR | Status: DC | PRN
  Administered 2015-01-20: 10 mL via INTRAVENOUS
  Filled 2015-01-20: qty 10

## 2015-01-20 MED ORDER — SODIUM CHLORIDE 0.9 % IV SOLN
Freq: Once | INTRAVENOUS | Status: AC
  Administered 2015-01-20: 12:00:00 via INTRAVENOUS
  Filled 2015-01-20: qty 5

## 2015-01-20 MED ORDER — FAMOTIDINE IN NACL 20-0.9 MG/50ML-% IV SOLN
20.0000 mg | Freq: Once | INTRAVENOUS | Status: AC
  Administered 2015-01-20: 20 mg via INTRAVENOUS

## 2015-01-20 NOTE — Progress Notes (Signed)
Pt states she is still having restless leg syndrome. Per Dr. Benay Spice, okay to give Ativan 0.5 mg po x 1 dose.

## 2015-01-20 NOTE — Progress Notes (Signed)
Pt requests decreased dose of Benadryl due to restless legs with last treatment requiring Ativan. OK to decrease Benadryl pre-med to 12.5 mg, per Ned Card, NP. Dawn, Infusion RN made aware.

## 2015-01-20 NOTE — Telephone Encounter (Signed)
per pof to sch pt sch-pt to get copy of pof in trmt room-sent MW emailto sch trmt

## 2015-01-20 NOTE — Telephone Encounter (Signed)
Per staff message and POF I have scheduled appts. Advised scheduler of appts. JMW  

## 2015-01-20 NOTE — Patient Instructions (Signed)

## 2015-01-20 NOTE — Progress Notes (Addendum)
[facility] OFFICE PROGRESS NOTE   Diagnosis:  Peritoneal carcinoma  INTERVAL HISTORY:   Lynn Ramirez returns as scheduled. Overall she feels well. She notes progressive fatigue. She has a good appetite. No nausea or vomiting. Bowels are moving. She has intermittent mild abdominal pain which is improving. She feels the pain is related to surgery. The numbness in her feet has resolved. She needs to have mild intermittent numbness in the fingertips. No significant shortness of breath.  Objective:  Vital signs in last 24 hours:  Blood pressure 146/69, pulse 80, temperature 97.7 F (36.5 C), temperature source Oral, resp. rate 18, height 5' (1.524 m), weight 173 lb (78.472 kg), SpO2 98 %.    HEENT: No thrush or ulcers. Resp: Lungs clear bilaterally. Cardio: Regular rate and rhythm. GI: Abdomen soft and nontender. Healed midline surgical incision. Vascular: No leg edema. Neuro: Vibratory sense mildly decreased over the fingertips per tuning fork exam.  Port-A-Cath without erythema.    Lab Results:  Lab Results  Component Value Date   WBC 4.6 01/20/2015   HGB 10.7* 01/20/2015   HCT 32.6* 01/20/2015   MCV 99.6 01/20/2015   PLT 311 01/20/2015   NEUTROABS 3.1 01/20/2015    Imaging:  No results found.  Medications: I have reviewed the patient's current medications.  Assessment/Plan: 1. 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, immunohistochemical profile consistent with a GYN primary, elevated CA 125  Staging CTs of the chest, abdomen, and pelvis on 10/06/2014 revealed a loculated right pleural effusion, ascites, and omental/mesenteric thickening  Cytology from peritoneal fluid 10/13/2014 revealed malignant cells consistent with metastatic adenocarcinoma  Biopsy of an omental mass on 11/02/2014 revealed invasive serous carcinoma with psammoma bodies  Cycle 1 Taxol/carboplatin 10/28/2014  Cycle 2  Taxol/carboplatin 11/18/2014  Cycle 3 Taxol/carboplatin 12/09/2014  CT scan 12/23/2014 with interval improvement in peritoneal carcinomatosis with near-complete resolution of ascites and decreased omental nodularity. Significant improvement in malignant right pleural effusion.  Status post robotic-assisted laparoscopic hysterectomy with bilateral salpingoophorectomy, omentectomy, radical tumor debulking 12/29/2014. Per Dr. Serita Grit office note 01/14/2015 cytoreduction was optimal with residual disease remaining only in the 1 mm implants on the small intestine. 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 2. COPD 3. Dyspnea secondary to COPD and the large right pleural effusion, status post therapeutic thoracentesis procedures 09/30/2014,10/09/2014, and 10/21/2014. Left thoracentesis 10/30/2014 4. Left nephrectomy as a child 5. Delayed nausea following cycle 1 Taxol/carboplatin, Aloxi/Emend added with cycle 2 with improvement. 6. Right lower extremity edema, right calf pain 12/09/2014. Negative venous Doppler 12/09/2014.   Disposition: Lynn Ramirez appears stable. She has completed 3 cycles of Taxol/carboplatin. She underwent debulking surgery 12/29/2014 with residual disease remaining only in 1 mm implants on the small intestine. Pathology showed cancer arising from the right fallopian tube, involving both ovaries and the omentum. The plan is to proceed with an additional 3 cycles of Taxol/carboplatin followed by restaging CT scans.   She will receive cycle 4 Taxol/carboplatin today as scheduled. She will return for a follow-up visit and cycle 5 in 3 weeks. She will contact the office in the interim with any  problems.  Patient seen with Dr. Benay Spice.    Ned Card ANP/GNP-BC   01/20/2015  9:41 AM  This was a shared visit  with Ned Card. Mr. Deerman underwent optimal debulking surgery. She was found to have a primary fallopian tube carcinoma. The plan is to proceed with adjuvant Taxol/carboplatin chemotherapy today. She will return for an office visit and chemotherapy in 3 weeks.  Julieanne Manson, M.D.

## 2015-01-20 NOTE — Progress Notes (Signed)
Pt in for lab draw via [place]. Pt states she has been having a hard time with blood drawn from port. Pt was accessed without any difficulties, tinge of blood was noted with first pull back. Flushes well with no other blood return noted. Called and informed Lavella Lemons, Rn with Dr. Benay Spice. Pt to get labs drawn from arm today. Cath-flo to be given if needed when pt sees Ned Card, NP today. Called and informed charge nurse in infusion room.

## 2015-01-20 NOTE — Patient Instructions (Signed)
[facility] Discharge Instructions for Patients Receiving Chemotherapy  Today you received the following chemotherapy agents: Taxol, Carboplatin   To help prevent nausea and vomiting after your treatment, we encourage you to take your nausea medication as directed.    If you develop nausea and vomiting that is not controlled by your nausea medication, call the clinic.   BELOW ARE SYMPTOMS THAT SHOULD BE REPORTED IMMEDIATELY:  *FEVER GREATER THAN 100.5 F  *CHILLS WITH OR WITHOUT FEVER  NAUSEA AND VOMITING THAT IS NOT CONTROLLED WITH YOUR NAUSEA MEDICATION  *UNUSUAL SHORTNESS OF BREATH  *UNUSUAL BRUISING OR BLEEDING  TENDERNESS IN MOUTH AND THROAT WITH OR WITHOUT PRESENCE OF ULCERS  *URINARY PROBLEMS  *BOWEL PROBLEMS  UNUSUAL RASH Items with * indicate a potential emergency and should be followed up as soon as possible.  Feel free to call the clinic you have any questions or concerns. The clinic phone number is [phone].  Please show the CHEMO ALERT CARD at check-in to the Emergency Department and triage nurse.   

## 2015-01-21 LAB — CA 125: CA 125: 69 U/mL — AB (ref <35)

## 2015-01-25 ENCOUNTER — Ambulatory Visit (HOSPITAL_BASED_OUTPATIENT_CLINIC_OR_DEPARTMENT_OTHER): Payer: Medicare Other | Admitting: Nurse Practitioner

## 2015-01-25 ENCOUNTER — Telehealth: Payer: Self-pay

## 2015-01-25 ENCOUNTER — Telehealth: Payer: Self-pay | Admitting: Nurse Practitioner

## 2015-01-25 VITALS — BP 130/81 | HR 97 | Temp 97.7°F

## 2015-01-25 DIAGNOSIS — C480 Malignant neoplasm of retroperitoneum: Secondary | ICD-10-CM

## 2015-01-25 DIAGNOSIS — T7840XA Allergy, unspecified, initial encounter: Secondary | ICD-10-CM

## 2015-01-25 DIAGNOSIS — J910 Malignant pleural effusion: Secondary | ICD-10-CM

## 2015-01-25 DIAGNOSIS — R110 Nausea: Secondary | ICD-10-CM

## 2015-01-25 DIAGNOSIS — C482 Malignant neoplasm of peritoneum, unspecified: Secondary | ICD-10-CM

## 2015-01-25 MED ORDER — METHYLPREDNISOLONE 4 MG PO TBPK: ORAL_TABLET | ORAL | Status: DC

## 2015-01-25 MED ORDER — ONDANSETRON 4 MG PO TBDP: 4.0000 mg | ORAL_TABLET | Freq: Three times a day (TID) | ORAL | Status: DC | PRN

## 2015-01-25 NOTE — Telephone Encounter (Signed)
Called pt with instructions to come in to office now, will be seen by Symptom Management NP.

## 2015-01-25 NOTE — Telephone Encounter (Signed)
Office visit added today per 07/18 POF, seeing NP/CB pt is aware... KJ

## 2015-01-25 NOTE — Telephone Encounter (Signed)
Added office visit for today with NP/CB per 07/18 POF, pt is aware... LK

## 2015-01-25 NOTE — Telephone Encounter (Signed)
Returning pt call. She c/o itching from knees up to neck since Friday. She did call on Saturday to nurse on call. Ointments are not helping. No rash is seen. Benadryl 25 po, no helping, aveeno will help about 30 minutes. She is scratching in her sleep.

## 2015-01-26 ENCOUNTER — Telehealth: Payer: Self-pay | Admitting: Nurse Practitioner

## 2015-01-26 ENCOUNTER — Encounter: Payer: Self-pay | Admitting: Nurse Practitioner

## 2015-01-26 DIAGNOSIS — R110 Nausea: Secondary | ICD-10-CM | POA: Insufficient documentation

## 2015-01-26 DIAGNOSIS — T7840XA Allergy, unspecified, initial encounter: Secondary | ICD-10-CM | POA: Insufficient documentation

## 2015-01-26 NOTE — Telephone Encounter (Signed)
Called to check in with patient regarding recent hypersensitivity reaction.  No answer.  Voice mail left.  Will attempt to check in with patient once again tomorrow.

## 2015-01-26 NOTE — Assessment & Plan Note (Signed)
1. 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, immunohistochemical profile consistent with a GYN primary, elevated CA 125  Staging CTs of the chest, abdomen, and pelvis on 10/06/2014 revealed a loculated right pleural effusion, ascites, and omental/mesenteric thickening  Cytology from peritoneal fluid 10/13/2014 revealed malignant cells consistent with metastatic adenocarcinoma  Biopsy of an omental mass on 11/02/2014 revealed invasive serous carcinoma with psammoma bodies  Cycle 1 Taxol/carboplatin 10/28/2014  Cycle 2 Taxol/carboplatin 11/18/2014  Cycle 3 Taxol/carboplatin 12/09/2014  CT scan 12/23/2014 with interval improvement in peritoneal carcinomatosis with near-complete resolution of ascites and decreased omental nodularity. Significant improvement in malignant right pleural effusion.  Status post robotic-assisted laparoscopic hysterectomy with bilateral salpingoophorectomy, omentectomy, radical tumor debulking 12/29/2014. Per Dr. Serita Grit office note 01/14/2015 cytoreduction was optimal with residual disease remaining only in the 1 mm implants on the small intestine. 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 2. COPD 3. Dyspnea secondary to COPD and the large right pleural effusion, status post therapeutic thoracentesis procedures 09/30/2014,10/09/2014, and 10/21/2014. Left thoracentesis 10/30/2014 4. Left nephrectomy as a child 5. Delayed nausea following cycle 1 Taxol/carboplatin, Aloxi/Emend added with cycle 2 with improvement. 6. Right lower extremity edema, right calf pain 12/09/2014. Negative venous Doppler 12/09/2014.   Patient received  her last cycle of Taxol/carboplatin chemotherapy on 01/20/2015.  She did develop some generalized pruritus following this last cycle of chemotherapy; which will be treated with a Medrol Dosepak.  Patient is scheduled to return on 02/10/2015 for labs, follow up visit, and her next chemotherapy.  She will also continue with weekly labs.

## 2015-01-26 NOTE — Assessment & Plan Note (Signed)
Patient is complaining of some mild, intermittent nausea; but no vomiting.  She is requesting a was given a refill of her Zofran ODT today.

## 2015-01-26 NOTE — Assessment & Plan Note (Signed)
Patient received 3 cycles of Taxol/carboplatin chemotherapy prior to her surgery.  Approximate 4 weeks ago.  She underwent her fourth cycle of the Taxol/carboplatin chemotherapy this past Wednesday, 01/20/2015.  She developed some generalized pruritus the following Friday, 01/22/2015.  She denies any rash whatsoever.  She also denies any other allergic reaction type symptoms whatsoever.  She denies any recent fevers or chills.  Patient states that she has been taking Benadryl every 4 hours on a regular basis since this past weekend in hopes of relieving her itching.  She's also used occasional calamine lotion and Aveeno oatmeal as well.  On exam.-Patient has no rash whatsoever.  She is however, scratching incessantly to her generalized body.  She has scratched to the point of bruising her bilateral forearms.  Advised patient that the intense, generalized itching is most likely secondary to her recent chemotherapy.  Patient was prescribed a Medrol dose pack for her allergic reaction symptoms.  Also, patient was advised that she could continue using occasional Benadryl 25 mg and Pepcid 20 mg twice daily.  However, advised patient not to overdo the Benadryl; since it is most likely the cause of her tachycardia.  Patient was advised to call/return or go directly to the emergency department for any worsening symptoms whatsoever.

## 2015-01-26 NOTE — Progress Notes (Signed)
SYMPTOM MANAGEMENT CLINIC   HPI: Lynn Ramirez 74 y.o. female diagnosed with metastatic ovarian cancer.  Currently undergoing Taxol/carboplatin chemotherapy regimen.  Patient received 3 cycles of Taxol/carboplatin chemotherapy prior to her surgery.  Approximate 4 weeks ago.  She underwent her fourth cycle of the Taxol/carboplatin chemotherapy this past Wednesday, 01/20/2015.  She developed some generalized pruritus the following Friday, 01/22/2015.  She denies any rash whatsoever.  She also denies any other allergic reaction type symptoms whatsoever.  She denies any recent fevers or chills.  Patient states that she has been taking Benadryl every 4 hours on a regular basis since this past weekend in hopes of relieving her itching.  She's also used occasional calamine lotion and Aveeno oatmeal as well.  HPI  ROS  Past Medical History  Diagnosis Date  . H/O hydronephrosis   . Hypertension   . Emphysema   . COPD (chronic obstructive pulmonary disease)   . Cancer     ovarian  . Eczema     hands  . GERD (gastroesophageal reflux disease)   . History of transfusion     age 29  . Difficulty sleeping     Past Surgical History  Procedure Laterality Date  . Nephrectomy    . Tonsilectomy, adenoidectomy, bilateral myringotomy and tubes    . Tubal ligation    . Hand surgery Right 1980's  . Thoracentesis      several  . Robotic assisted total hysterectomy with bilateral salpingo oopherectomy Bilateral 12/29/2014    Procedure: ROBOTIC ASSISTED TOTAL LAPAROSCOPIC HYSTERECTOMY WITH BILATERAL SALPINGO OOPHORECTOMY AND OOMENTECTOMY WITH RADICAL TUMOR [place] ;  Surgeon: Everitt Amber, MD;  Location: [facility];  Service: Gynecology;  Laterality: Bilateral;  . Laparotomy N/A 12/29/2014    Procedure:  LAPAROTOMY;  Surgeon: Everitt Amber, MD;  Location: [facility];  Service: Gynecology;  Laterality: N/A;    has COPD (chronic obstructive pulmonary disease); Acute asthmatic bronchitis; Encopresis; COPD  exacerbation; Pleural effusion; Pelvic mass in female; Insomnia; Metastatic adenocarcinoma; Primary peritoneal carcinomatosis; Ascites; Adenocarcinoma; Ovarian cancer; Nausea without vomiting; and Hypersensitivity reaction on her problem list.    is allergic to latex and penicillins.    Medication List       This list is accurate as of: 01/25/15 11:59 PM.  Always use your most recent med list.               acetaminophen 325 MG tablet  Commonly known as:  TYLENOL  Take 650 mg by mouth every 6 (six) hours as needed (for pain).     antiseptic oral rinse Liqd  15 mLs by Mouth Rinse route 3 (three) times daily.     Biotin 2500 MCG Caps  Take 1 tablet by mouth daily.     CALCIUM 600 + D PO  Take 1 tablet by mouth daily.     cholecalciferol 1000 UNITS tablet  Commonly known as:  VITAMIN D  Take 1,000 Units by mouth every morning.     diphenhydrAMINE 25 mg capsule  Commonly known as:  BENADRYL  Take 25 mg by mouth every 4 (four) hours as needed.     fish oil-omega-3 fatty acids 1000 MG capsule  Take 1 g by mouth every morning.     lidocaine-prilocaine cream  Commonly known as:  EMLA  Apply to port site one hour prior to use. Do not rub in. Cover with plastic.     loratadine 10 MG tablet  Commonly known as:  CLARITIN  Take 10 mg by  mouth at bedtime.     methylPREDNISolone 4 MG Tbpk tablet  Commonly known as:  MEDROL DOSEPAK  Medrol dose pak: take as directed.     ondansetron 4 MG disintegrating tablet  Commonly known as:  ZOFRAN ODT  Take 1 tablet (4 mg total) by mouth every 8 (eight) hours as needed for nausea or vomiting.     oxyCODONE-acetaminophen 5-325 MG per tablet  Commonly known as:  PERCOCET/ROXICET  Take 1-2 tablets by mouth every 4 (four) hours as needed (moderate to severe pain).     PEPCID AC PO  Take 1 tablet by mouth daily.     PRESCRIPTION MEDICATION  Chemo - [facility]     PROAIR HFA 108 (90 BASE) MCG/ACT inhaler  Generic drug:  albuterol  INHALE 2  PUFFS BY MOUTH FOUR TIMES DAILY AS NEEDED FOR WHEEZING     prochlorperazine 5 MG tablet  Commonly known as:  COMPAZINE  Take 1 tablet (5 mg total) by mouth every 6 (six) hours as needed for nausea or vomiting.     SYMBICORT 160-4.5 MCG/ACT inhaler  Generic drug:  budesonide-formoterol  INHALE 2 PUFFS BY MOUTH TWICE DAILY     Tiotropium Bromide Monohydrate 2.5 MCG/ACT Aers  Commonly known as:  SPIRIVA RESPIMAT  Inhale 2 puffs into the lungs daily.     triamterene-hydrochlorothiazide 37.5-25 MG per tablet  Commonly known as:  MAXZIDE-25  Take 1 tablet by mouth every morning.     zaleplon 5 MG capsule  Commonly known as:  SONATA  Take 1 capsule (5 mg total) by mouth at bedtime as needed for sleep.         PHYSICAL EXAMINATION  Oncology Vitals 01/25/2015 01/25/2015 01/20/2015 01/14/2015 12/30/2014 12/30/2014 12/30/2014  Height - - 152 cm 152 cm - - -  Weight - - 78.472 kg 78.155 kg - - -  Weight (lbs) - - 173 lbs 172 lbs 5 oz - - -  BMI (kg/m2) - - 33.79 kg/m2 33.65 kg/m2 - - -  Temp - 97.7 97.7 97.8 - 98.2 98.1  Pulse 97 121 80 95 - 83 86  Resp - - 18 20 - 16 16  SpO2 98 97 98 95 97 98 100  BSA (m2) - - 1.82 m2 1.82 m2 - - -   BP Readings from Last 3 Encounters:  01/25/15 130/81  01/20/15 146/69  01/14/15 126/75    Physical Exam  Constitutional: She is oriented to person, place, and time and well-developed, well-nourished, and in no distress.  HENT:  Head: Normocephalic and atraumatic.  Mouth/Throat: Oropharynx is clear and moist.  Eyes: Conjunctivae and EOM are normal. Pupils are equal, round, and reactive to light. Right eye exhibits no discharge. Left eye exhibits no discharge. No scleral icterus.  Neck: Normal range of motion.  Pulmonary/Chest: Effort normal. No stridor. No respiratory distress.  Musculoskeletal: Normal range of motion.  Neurological: She is alert and oriented to person, place, and time.  Skin: Skin is warm and dry. No rash noted. No erythema. No  pallor.  Patient is continually scratching at her generalized body; but no evidence of rash.  However, patient does have multiple bruises to bilateral forearms from scratching.  Psychiatric: Affect normal.  Nursing note and vitals reviewed.   LABORATORY DATA:. No visits with results within 3 Day(s) from this visit. Latest known visit with results is:  Appointment on 01/20/2015  Component Date Value Ref Range Status  . WBC 01/20/2015 4.6  3.9 - 10.3 10e3/uL Final  .  NEUT# 01/20/2015 3.1  1.5 - 6.5 10e3/uL Final  . HGB 01/20/2015 10.7* 11.6 - 15.9 g/dL Final  . HCT 01/20/2015 32.6* 34.8 - 46.6 % Final  . Platelets 01/20/2015 311  145 - 400 10e3/uL Final  . MCV 01/20/2015 99.6  79.5 - 101.0 fL Final  . MCH 01/20/2015 32.6  25.1 - 34.0 pg Final  . MCHC 01/20/2015 32.7  31.5 - 36.0 g/dL Final  . RBC 01/20/2015 3.27* 3.70 - 5.45 10e6/uL Final  . RDW 01/20/2015 22.4* 11.2 - 14.5 % Final  . lymph# 01/20/2015 1.0  0.9 - 3.3 10e3/uL Final  . MONO# 01/20/2015 0.4  0.1 - 0.9 10e3/uL Final  . Eosinophils Absolute 01/20/2015 0.2  0.0 - 0.5 10e3/uL Final  . Basophils Absolute 01/20/2015 0.0  0.0 - 0.1 10e3/uL Final  . NEUT% 01/20/2015 65.8  38.4 - 76.8 % Final  . LYMPH% 01/20/2015 20.7  14.0 - 49.7 % Final  . MONO% 01/20/2015 8.5  0.0 - 14.0 % Final  . EOS% 01/20/2015 4.0  0.0 - 7.0 % Final  . BASO% 01/20/2015 1.0  0.0 - 2.0 % Final  . Sodium 01/20/2015 140  136 - 145 mEq/L Final  . Potassium 01/20/2015 3.8  3.5 - 5.1 mEq/L Final  . Chloride 01/20/2015 99  98 - 109 mEq/L Final  . CO2 01/20/2015 33* 22 - 29 mEq/L Final  . Glucose 01/20/2015 114  70 - 140 mg/dl Final  . BUN 01/20/2015 27.3* 7.0 - 26.0 mg/dL Final  . Creatinine 01/20/2015 1.0  0.6 - 1.1 mg/dL Final  . Total Bilirubin 01/20/2015 0.30  0.20 - 1.20 mg/dL Final  . Alkaline Phosphatase 01/20/2015 83  40 - 150 U/L Final  . AST 01/20/2015 13  5 - 34 U/L Final  . ALT 01/20/2015 15  0 - 55 U/L Final  . Total Protein 01/20/2015 7.5   6.4 - 8.3 g/dL Final  . Albumin 01/20/2015 3.7  3.5 - 5.0 g/dL Final  . Calcium 01/20/2015 10.2  8.4 - 10.4 mg/dL Final  . Anion Gap 01/20/2015 8  3 - 11 mEq/L Final  . EGFR 01/20/2015 54* >90 ml/min/1.73 m2 Final   eGFR is calculated using the CKD-EPI Creatinine Equation (2009)  . CA 125 01/20/2015 69* <35 U/mL Final   Comment:  This test was performed using the Bernice chemiluminescentmethod.  Values obtained from different assay methods cannot be usedinterchangeably.  CA125 levels , regardless of value, should not beinterpreted as absolute evidence of the presence or  absence ofdisease.      RADIOGRAPHIC STUDIES: No results found.  ASSESSMENT/PLAN:    Hypersensitivity reaction Patient received 3 cycles of Taxol/carboplatin chemotherapy prior to her surgery.  Approximate 4 weeks ago.  She underwent her fourth cycle of the Taxol/carboplatin chemotherapy this past Wednesday, 01/20/2015.  She developed some generalized pruritus the following Friday, 01/22/2015.  She denies any rash whatsoever.  She also denies any other allergic reaction type symptoms whatsoever.  She denies any recent fevers or chills.  Patient states that she has been taking Benadryl every 4 hours on a regular basis since this past weekend in hopes of relieving her itching.  She's also used occasional calamine lotion and Aveeno oatmeal as well.  On exam.-Patient has no rash whatsoever.  She is however, scratching incessantly to her generalized body.  She has scratched to the point of bruising her bilateral forearms.  Advised patient that the intense, generalized itching is most likely secondary to her recent chemotherapy.  Patient was prescribed a Medrol dose pack for her allergic reaction symptoms.  Also, patient was advised that she could continue using occasional Benadryl 25 mg and Pepcid 20 mg twice daily.  However, advised patient not to overdo the Benadryl; since it is most likely the cause of her  tachycardia.  Patient was advised to call/return or go directly to the emergency department for any worsening symptoms whatsoever.    Nausea without vomiting Patient is complaining of some mild, intermittent nausea; but no vomiting.  She is requesting a was given a refill of her Zofran ODT today.  Primary peritoneal carcinomatosis 1. 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, immunohistochemical profile consistent with a GYN primary, elevated CA 125  Staging CTs of the chest, abdomen, and pelvis on 10/06/2014 revealed a loculated right pleural effusion, ascites, and omental/mesenteric thickening  Cytology from peritoneal fluid 10/13/2014 revealed malignant cells consistent with metastatic adenocarcinoma  Biopsy of an omental mass on 11/02/2014 revealed invasive serous carcinoma with psammoma bodies  Cycle 1 Taxol/carboplatin 10/28/2014  Cycle 2 Taxol/carboplatin 11/18/2014  Cycle 3 Taxol/carboplatin 12/09/2014  CT scan 12/23/2014 with interval improvement in peritoneal carcinomatosis with near-complete resolution of ascites and decreased omental nodularity. Significant improvement in malignant right pleural effusion.  Status post robotic-assisted laparoscopic hysterectomy with bilateral salpingoophorectomy, omentectomy, radical tumor debulking 12/29/2014. Per Dr. Serita Grit office note 01/14/2015 cytoreduction was optimal with residual disease remaining only in the 1 mm implants on the small intestine. 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 2. COPD 3. Dyspnea secondary to COPD and the large right pleural effusion, status post therapeutic  thoracentesis procedures 09/30/2014,10/09/2014, and 10/21/2014. Left thoracentesis 10/30/2014 4. Left nephrectomy as a child 5. Delayed nausea following cycle 1 Taxol/carboplatin, Aloxi/Emend added with cycle 2 with improvement. 6. Right lower extremity edema, right calf pain 12/09/2014. Negative venous Doppler 12/09/2014.   Patient received her last cycle of Taxol/carboplatin chemotherapy on 01/20/2015.  She did develop some generalized pruritus following this last cycle of chemotherapy; which will be treated with a Medrol Dosepak.  Patient is scheduled to return on 02/10/2015 for labs, follow up visit, and her next chemotherapy.  She will also continue with weekly labs.   Patient was given a signed release per Dr. Benay Spice for therapeutic massage.  Patient stated understanding of all instructions; and was in agreement with this plan of care. The patient knows to call the clinic with any problems, questions or concerns.   This was a shared visit with Dr. Benay Spice today.  Total time spent with patient was 25 minutes;  with greater than 75 percent of that time spent in face to face counseling regarding patient's symptoms,  and coordination of care and follow up.  Disclaimer: This note was dictated with voice recognition software. Similar sounding words can inadvertently be transcribed and may not be corrected upon review.   Drue Second, NP 01/26/2015   This was a shared visit with Drue Second. Ms. Maxham was interviewed and examined.  She has intense pruritus, potentially related to chemotherapy. However she had no acute allergic reaction with the chemotherapy last week and there is no apparent rash. We decided to try a steroid  Dosepak. She will contact us if the pruritus is not improved over the next few days.  Julieanne Manson,  M.D. 

## 2015-01-27 ENCOUNTER — Telehealth: Payer: Self-pay

## 2015-01-27 NOTE — Telephone Encounter (Signed)
Called to follow up with pt after 7/18 visit. Pt states itching is much better but still has some. States it is well controlled with the prednisone and is able to sleep at night now. Informed pt to call with any worsening or other questions or concerns. Pt verbalized understanding and verbalized next appt.

## 2015-02-04 ENCOUNTER — Encounter: Payer: Self-pay | Admitting: Genetic Counselor

## 2015-02-04 ENCOUNTER — Ambulatory Visit (HOSPITAL_BASED_OUTPATIENT_CLINIC_OR_DEPARTMENT_OTHER): Payer: Medicare Other | Admitting: Genetic Counselor

## 2015-02-04 ENCOUNTER — Other Ambulatory Visit: Payer: Medicare Other

## 2015-02-04 DIAGNOSIS — Z809 Family history of malignant neoplasm, unspecified: Secondary | ICD-10-CM

## 2015-02-04 DIAGNOSIS — Z800 Family history of malignant neoplasm of digestive organs: Secondary | ICD-10-CM | POA: Diagnosis not present

## 2015-02-04 DIAGNOSIS — Z801 Family history of malignant neoplasm of trachea, bronchus and lung: Secondary | ICD-10-CM

## 2015-02-04 DIAGNOSIS — Z803 Family history of malignant neoplasm of breast: Secondary | ICD-10-CM

## 2015-02-04 DIAGNOSIS — Z315 Encounter for genetic counseling: Secondary | ICD-10-CM

## 2015-02-04 DIAGNOSIS — C5700 Malignant neoplasm of unspecified fallopian tube: Secondary | ICD-10-CM | POA: Diagnosis not present

## 2015-02-04 NOTE — Progress Notes (Signed)
REFERRING PROVIDER: Everitt Amber, MD  OTHER PROVIDER: Betsy Coder, MD  PRIMARY PROVIDER:  Antony Blackbird, MD  PRIMARY REASON FOR VISIT:  1. Fallopian tube cancer, carcinoma, unspecified laterality   2. Family history of pancreatic cancer   3. Family history of lung cancer   4. Family history of breast cancer in female   42. Family history of cancer      HISTORY OF PRESENT ILLNESS:   Lynn Ramirez, a 74 y.o. female, was seen for a [facility] cancer genetics consultation at the request of Dr. Chapman Fitch due to a personal history of cancer of the fallopian tube and family history of pancreatic and other cancers.  Lynn Ramirez presents to clinic today to discuss the possibility of a hereditary predisposition to cancer, genetic testing, and to further clarify her future cancer risks, as well as potential cancer risks for family members.   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 This was treated with TAH-BSO and neoadjuvant chemotherapy.      CANCER HISTORY:   No history exists.  March 2016 - carcinoma of the right fallopian tube with involvement of ovaries and omentum   HORMONAL RISK FACTORS:  Menarche was at age 91-12.  First live birth at age 70.  OCP use for approximately 3-4 months. Ovaries intact: no.  Hysterectomy: yes.  Menopausal status: postmenopausal.  HRT use: approximately 8 years of Prempro Colonoscopy: yes; 1 hyperplastic polyp and 1 benign polypoid found in 08/2014; reported hx of about 4-5 polyps total. Mammogram within the last year: was due for one in March 2016, will have one soon. Number of breast biopsies: 0. Up to date with pelvic exams:  yes. Any excessive radiation exposure in the past:  no  Past Medical History  Diagnosis Date  . H/O hydronephrosis   . Hypertension   . Emphysema   . COPD (chronic obstructive pulmonary disease)   . Cancer    ovarian  . Eczema     hands  . GERD (gastroesophageal reflux disease)   . History of transfusion     age 74  . Difficulty sleeping     Past Surgical History  Procedure Laterality Date  . Nephrectomy    . Tonsilectomy, adenoidectomy, bilateral myringotomy and tubes    . Tubal ligation    . Hand surgery Right 1980's  . Thoracentesis      several  . Robotic assisted total hysterectomy with bilateral salpingo oopherectomy Bilateral 12/29/2014    Procedure: ROBOTIC ASSISTED TOTAL LAPAROSCOPIC HYSTERECTOMY WITH BILATERAL SALPINGO OOPHORECTOMY AND OOMENTECTOMY WITH RADICAL TUMOR [place] ;  Surgeon: Everitt Amber, MD;  Location: [facility];  Service: Gynecology;  Laterality: Bilateral;  . Laparotomy N/A 12/29/2014    Procedure:  LAPAROTOMY;  Surgeon: Everitt Amber, MD;  Location: [facility];  Service: Gynecology;  Laterality: N/A;    History   Social History  . Marital Status: Married    Spouse Name: N/A  . Number of Children: 3  . Years of Education: N/A   Occupational History  . RETIRED    Social History Main Topics  . Smoking status: Former Smoker -- 1.00 packs/day for 40 years    Types: Cigarettes    Quit date: 07/10/2013  . Smokeless tobacco: Never Used     Comment: Pt states that she is has struggled with smoking since Jan--1 cig here and there 04/09/14  . Alcohol Use: Yes  Comment: social   . Drug Use: No  . Sexual Activity: Not on file   Other Topics Concern  . Not on file   Social History Narrative     FAMILY HISTORY:  We obtained a detailed, 4-generation family history.  Significant diagnoses are listed below: Family History  Problem Relation Age of Onset  . Diabetes Father   . Lung cancer Father 61    metastasis to liver  . Pancreatic cancer Mother 32  . Stroke Paternal Grandfather   . Colon cancer Neg Hx   . Stomach cancer Neg Hx   . Ramirez disease Maternal Aunt   . Diabetes Paternal Uncle   . Ramirez Problems Maternal Grandmother   . Ramirez Problems Maternal  Grandfather   . Diabetes Paternal Grandmother   . Stroke Paternal Grandmother   . Ramirez Problems Paternal Uncle   . Cancer Cousin     unknown type  . Breast cancer Cousin     dx. 77s  . Leukemia Cousin     dx. 16-17    Lynn Ramirez has two daughters, ages 53 and 26, and one son, age 73.  She currently has five grandchildren.  She has one full brother, age 39, who is cancer-free, and one full sister, age 55, who is also cancer-free.  Her mother was diagnosed and died of pancreatic cancer at 43; she was a smoker.  Her father was diagnosed with and died of lung cancer which metastasized to his liver at the age of 30.  Her father was a smoker as well.    There is a maternal family history of unknown cancer in a maternal first cousin, who passed away in her 38s.  A maternal first cousin, once-removed who has passed away was diagnosed with breast cancer in her 51s.  The son of this cousin was diagnosed with leukemia around the age of 28-17.  Lynn Ramirez's mother had five full sisters and two full brothers, none of whom had cancer.  The maternal aunts died in their 68s-80s, mostly of Ramirez disease, except for one aunt who died in her early 9s from an unknown cause.  The maternal uncles died in their 7s-80s.  There are no additional known cancer diagnoses in any maternal cousins.  Lynn Ramirez's maternal grandmother died in her 40s and her maternal grandfather died in his 60s--both due to Ramirez problems.    Lynn Ramirez's father had three full brothers, none of whom had cancer.  One died in his 27s with diabetes; on died of Ramirez problems; and the third passed away in his 21s-90s.  Lynn Ramirez has no information on her paternal cousins.  Her paternal grandmother died of diabetes/stroke in her 30s.  Her grandfather had a stroke in his 55s.     Patient's maternal ancestors are of [place] descent, and paternal ancestors are of [place] descent. There is no reported Ashkenazi Jewish ancestry. There is  no known consanguinity.  GENETIC COUNSELING ASSESSMENT: Stellarose Cerny is a 74 y.o. female with a personal and family history of cancer which is somewhat suggestive of a hereditary cancer syndrom and predisposition to cancer. We, therefore, discussed and recommended the following at today's visit.   DISCUSSION: We reviewed the characteristics, features and inheritance patterns of hereditary cancer syndromes, particularly those caused by changes within the BRCA1/2 genes. We also discussed genetic testing, including the appropriate family members to test, the process of testing, insurance coverage and turn-around-time for results. We discussed the implications of a negative, positive  and/or variant of uncertain significant result. We recommended Ms. Arai pursue genetic testing for the 20-gene Breast/Ovarian Cancer Panel through [facility] Hope Pigeon, MD).   Based on Ms. Kratky's personal history of cancer, she meets medical criteria for genetic testing. Despite that she meets criteria, she may still have an out of pocket cost. We discussed that if her out of pocket cost for testing is over $100, the laboratory will call and confirm whether she wants to proceed with testing.  If the out of pocket cost of testing is less than $100 she will be billed by the genetic testing laboratory.   PLAN: After considering the risks, benefits, and limitations, Ms. Tool  provided informed consent to pursue genetic testing and the blood sample was sent to [facility] for analysis of the 20-gene Breast/Ovarian Cancer Panel test.  The Breast/Ovarian gene panel offered by [facility] includes sequencing and deletion/duplication analysis for the following 19 genes:  ATM, BARD1, BRCA1, BRCA2, BRIP1, CDH1, CHEK2, FANCC, MLH1, MSH2, MSH6, NBN, PALB2, PMS2, PTEN, RAD51C, RAD51D, TP53, and XRCC2.  This panel also includes deletion/duplication analysis (without sequencing) for one gene, EPCAM.  Results  should be available within approximately 2-3 weeks' time, at which point they will be disclosed by telephone to Ms. Callins, as will any additional recommendations warranted by these results. Ms. Ensey will receive a summary of her genetic counseling visit and a copy of her results once available. This information will also be available in Epic. We encouraged Ms. Hensarling to remain in contact with cancer genetics annually so that we can continuously update the family history and inform her of any changes in cancer genetics and testing that may be of benefit for her family. Ms. Iman's questions were answered to her satisfaction today. Our contact information was provided should additional questions or concerns arise.  Thank you for the referral and allowing [place] to share in the care of your patient.   Jeanine Luz, MS Genetic Counselor [email or web address] Phone: [phone]  The patient was seen for a total of 30 minutes in face-to-face genetic counseling.  This patient was discussed with Drs. Magrinat, Lindi Adie and/or Burr Medico who agrees with the above.    _______________________________________________________________________ For Office Staff:  Number of people involved in session: 2 Was an Intern/ student involved with case: no

## 2015-02-06 DIAGNOSIS — Z801 Family history of malignant neoplasm of trachea, bronchus and lung: Secondary | ICD-10-CM | POA: Diagnosis not present

## 2015-02-06 DIAGNOSIS — Z800 Family history of malignant neoplasm of digestive organs: Secondary | ICD-10-CM | POA: Diagnosis not present

## 2015-02-06 DIAGNOSIS — Z803 Family history of malignant neoplasm of breast: Secondary | ICD-10-CM | POA: Diagnosis not present

## 2015-02-07 ENCOUNTER — Other Ambulatory Visit: Payer: Self-pay | Admitting: Oncology

## 2015-02-10 ENCOUNTER — Ambulatory Visit (HOSPITAL_BASED_OUTPATIENT_CLINIC_OR_DEPARTMENT_OTHER): Payer: Medicare Other

## 2015-02-10 ENCOUNTER — Ambulatory Visit: Payer: Medicare Other

## 2015-02-10 ENCOUNTER — Other Ambulatory Visit (HOSPITAL_BASED_OUTPATIENT_CLINIC_OR_DEPARTMENT_OTHER): Payer: Medicare Other

## 2015-02-10 ENCOUNTER — Ambulatory Visit (HOSPITAL_BASED_OUTPATIENT_CLINIC_OR_DEPARTMENT_OTHER): Payer: Medicare Other | Admitting: Oncology

## 2015-02-10 ENCOUNTER — Telehealth: Payer: Self-pay | Admitting: Oncology

## 2015-02-10 VITALS — BP 142/76 | HR 90 | Temp 98.2°F | Resp 18 | Ht 60.0 in | Wt 175.1 lb

## 2015-02-10 DIAGNOSIS — D6959 Other secondary thrombocytopenia: Secondary | ICD-10-CM | POA: Diagnosis not present

## 2015-02-10 DIAGNOSIS — Z5111 Encounter for antineoplastic chemotherapy: Secondary | ICD-10-CM

## 2015-02-10 DIAGNOSIS — J449 Chronic obstructive pulmonary disease, unspecified: Secondary | ICD-10-CM | POA: Diagnosis not present

## 2015-02-10 DIAGNOSIS — C799 Secondary malignant neoplasm of unspecified site: Secondary | ICD-10-CM

## 2015-02-10 DIAGNOSIS — J910 Malignant pleural effusion: Secondary | ICD-10-CM

## 2015-02-10 DIAGNOSIS — C801 Malignant (primary) neoplasm, unspecified: Secondary | ICD-10-CM | POA: Diagnosis not present

## 2015-02-10 DIAGNOSIS — C482 Malignant neoplasm of peritoneum, unspecified: Secondary | ICD-10-CM

## 2015-02-10 DIAGNOSIS — C569 Malignant neoplasm of unspecified ovary: Secondary | ICD-10-CM | POA: Diagnosis not present

## 2015-02-10 DIAGNOSIS — R0600 Dyspnea, unspecified: Secondary | ICD-10-CM | POA: Diagnosis not present

## 2015-02-10 DIAGNOSIS — C786 Secondary malignant neoplasm of retroperitoneum and peritoneum: Secondary | ICD-10-CM | POA: Diagnosis not present

## 2015-02-10 DIAGNOSIS — Z95828 Presence of other vascular implants and grafts: Secondary | ICD-10-CM

## 2015-02-10 LAB — CBC WITH DIFFERENTIAL/PLATELET
BASO%: 0.6 % (ref 0.0–2.0)
BASOS ABS: 0 10*3/uL (ref 0.0–0.1)
EOS ABS: 0 10*3/uL (ref 0.0–0.5)
EOS%: 0.4 % (ref 0.0–7.0)
HEMATOCRIT: 29.3 % — AB (ref 34.8–46.6)
HGB: 9.6 g/dL — ABNORMAL LOW (ref 11.6–15.9)
LYMPH%: 22.3 % (ref 14.0–49.7)
MCH: 33 pg (ref 25.1–34.0)
MCHC: 32.7 g/dL (ref 31.5–36.0)
MCV: 100.9 fL (ref 79.5–101.0)
MONO#: 0.4 10*3/uL (ref 0.1–0.9)
MONO%: 7.8 % (ref 0.0–14.0)
NEUT#: 3.1 10*3/uL (ref 1.5–6.5)
NEUT%: 68.9 % (ref 38.4–76.8)
Platelets: 89 10*3/uL — ABNORMAL LOW (ref 145–400)
RBC: 2.91 10*6/uL — AB (ref 3.70–5.45)
RDW: 18.9 % — ABNORMAL HIGH (ref 11.2–14.5)
WBC: 4.5 10*3/uL (ref 3.9–10.3)
lymph#: 1 10*3/uL (ref 0.9–3.3)

## 2015-02-10 LAB — COMPREHENSIVE METABOLIC PANEL (CC13)
ALBUMIN: 3.1 g/dL — AB (ref 3.5–5.0)
ALK PHOS: 79 U/L (ref 40–150)
ALT: 19 U/L (ref 0–55)
AST: 14 U/L (ref 5–34)
Anion Gap: 6 mEq/L (ref 3–11)
BILIRUBIN TOTAL: 0.21 mg/dL (ref 0.20–1.20)
BUN: 24.6 mg/dL (ref 7.0–26.0)
CHLORIDE: 102 meq/L (ref 98–109)
CO2: 30 mEq/L — ABNORMAL HIGH (ref 22–29)
Calcium: 9.6 mg/dL (ref 8.4–10.4)
Creatinine: 0.9 mg/dL (ref 0.6–1.1)
EGFR: 65 mL/min/{1.73_m2} — ABNORMAL LOW (ref >90)
Glucose: 128 mg/dl (ref 70–140)
Potassium: 3.9 mEq/L (ref 3.5–5.1)
SODIUM: 139 meq/L (ref 136–145)
TOTAL PROTEIN: 6.5 g/dL (ref 6.4–8.3)

## 2015-02-10 LAB — CA 125: CA 125: 31 U/mL (ref <35)

## 2015-02-10 MED ORDER — CARBOPLATIN CHEMO INTRADERMAL TEST DOSE 100MCG/0.02ML
100.0000 ug | Freq: Once | INTRADERMAL | Status: AC
  Administered 2015-02-10: 100 ug via INTRADERMAL
  Filled 2015-02-10: qty 0.01

## 2015-02-10 MED ORDER — SODIUM CHLORIDE 0.9 % IJ SOLN
10.0000 mL | INTRAMUSCULAR | Status: DC | PRN
  Administered 2015-02-10: 10 mL
  Filled 2015-02-10: qty 10

## 2015-02-10 MED ORDER — LORAZEPAM 1 MG PO TABS
0.5000 mg | ORAL_TABLET | Freq: Once | ORAL | Status: AC
  Administered 2015-02-10: 0.5 mg via ORAL

## 2015-02-10 MED ORDER — PALONOSETRON HCL INJECTION 0.25 MG/5ML
INTRAVENOUS | Status: AC
  Filled 2015-02-10: qty 5

## 2015-02-10 MED ORDER — SODIUM CHLORIDE 0.9 % IV SOLN
Freq: Once | INTRAVENOUS | Status: AC
  Administered 2015-02-10: 11:00:00 via INTRAVENOUS

## 2015-02-10 MED ORDER — PACLITAXEL CHEMO INJECTION 300 MG/50ML
175.0000 mg/m2 | Freq: Once | INTRAVENOUS | Status: AC
  Administered 2015-02-10: 330 mg via INTRAVENOUS
  Filled 2015-02-10: qty 55

## 2015-02-10 MED ORDER — FAMOTIDINE IN NACL 20-0.9 MG/50ML-% IV SOLN
INTRAVENOUS | Status: AC
  Filled 2015-02-10: qty 50

## 2015-02-10 MED ORDER — SODIUM CHLORIDE 0.9 % IJ SOLN
10.0000 mL | INTRAMUSCULAR | Status: DC | PRN
  Administered 2015-02-10: 10 mL via INTRAVENOUS
  Filled 2015-02-10: qty 10

## 2015-02-10 MED ORDER — LORAZEPAM 1 MG PO TABS
ORAL_TABLET | ORAL | Status: AC
Start: 2015-02-10 — End: 2015-02-10
  Filled 2015-02-10: qty 1

## 2015-02-10 MED ORDER — FAMOTIDINE IN NACL 20-0.9 MG/50ML-% IV SOLN
20.0000 mg | Freq: Once | INTRAVENOUS | Status: AC
  Administered 2015-02-10: 20 mg via INTRAVENOUS

## 2015-02-10 MED ORDER — SODIUM CHLORIDE 0.9 % IV SOLN
Freq: Once | INTRAVENOUS | Status: AC
  Administered 2015-02-10: 12:00:00 via INTRAVENOUS
  Filled 2015-02-10: qty 5

## 2015-02-10 MED ORDER — PALONOSETRON HCL INJECTION 0.25 MG/5ML
0.2500 mg | Freq: Once | INTRAVENOUS | Status: AC
  Administered 2015-02-10: 0.25 mg via INTRAVENOUS

## 2015-02-10 MED ORDER — DIPHENHYDRAMINE HCL 50 MG/ML IJ SOLN
INTRAMUSCULAR | Status: AC
  Filled 2015-02-10: qty 1

## 2015-02-10 MED ORDER — HEPARIN SOD (PORK) LOCK FLUSH 100 UNIT/ML IV SOLN
500.0000 [IU] | Freq: Once | INTRAVENOUS | Status: AC | PRN
  Administered 2015-02-10: 500 [IU]
  Filled 2015-02-10: qty 5

## 2015-02-10 MED ORDER — SODIUM CHLORIDE 0.9 % IV SOLN
370.0000 mg | Freq: Once | INTRAVENOUS | Status: AC
  Administered 2015-02-10: 370 mg via INTRAVENOUS
  Filled 2015-02-10: qty 37

## 2015-02-10 MED ORDER — DIPHENHYDRAMINE HCL 50 MG/ML IJ SOLN
12.5000 mg | Freq: Once | INTRAMUSCULAR | Status: AC
  Administered 2015-02-10: 12.5 mg via INTRAVENOUS

## 2015-02-10 NOTE — Patient Instructions (Signed)

## 2015-02-10 NOTE — Progress Notes (Signed)
OK to treat with low platelet count per Dr. Benay Spice. Pt complaining of restless legs. Order for 0.5 of Ativan received from Dr. Benay Spice. Patient voiced relief from restless legs.

## 2015-02-10 NOTE — Progress Notes (Signed)
[facility] OFFICE PROGRESS NOTE   Diagnosis: Ovarian cancer  INTERVAL HISTORY:   Mr. Criscuolo returns as scheduled. She completed another cycle of Taxol/carboplatin 01/20/2015. She noted decreased leg restlessness with the 12.5 mg dose of Benadryl. On day 3 following chemotherapy she developed diffuse pruritus. No rash. The pruritus did not resolve with oral Benadryl over several days. She was seen for an office visit and no rash was noted. The pruritus resolved with a steroid-induced pack. She reports numbness in the feet following chemotherapy. This has improved. She has noted increased dyspnea for the past few days.  She has no discomfort at the right lower leg after ambulation.  Objective:  Vital signs in last 24 hours:  Blood pressure 142/76, pulse 90, temperature 98.2 F (36.8 C), temperature source Oral, resp. rate 18, height 5' (1.524 m), weight 175 lb 1.6 oz (79.425 kg), SpO2 96 %.    HEENT: No thrush or ulcers Resp: Clear bilaterally, no respiratory distress Cardio: Regular rate and rhythm GI: No hepatomegaly, nontender, no apparent ascites Vascular: Trace edema at the low leg bilaterally  Skin: No rash  Musculoskeletal: Examination of the right lower leg is unremarkable. No pain with motion at the right hip.  Portacath/PICC-without erythema  Lab Results:  Lab Results  Component Value Date   WBC 4.5 02/10/2015   HGB 9.6* 02/10/2015   HCT 29.3* 02/10/2015   MCV 100.9 02/10/2015   PLT 89* 02/10/2015   NEUTROABS 3.1 02/10/2015   01/20/2015-CA 125  69  Medications: I have reviewed the patient's current medications.  Assessment/Plan: 1. 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, immunohistochemical profile consistent with a GYN primary, elevated CA 125  Staging CTs of the chest, abdomen, and pelvis on 10/06/2014 revealed a loculated right pleural effusion, ascites, and omental/mesenteric  thickening  Cytology from peritoneal fluid 10/13/2014 revealed malignant cells consistent with metastatic adenocarcinoma  Biopsy of an omental mass on 11/02/2014 revealed invasive serous carcinoma with psammoma bodies  Cycle 1 Taxol/carboplatin 10/28/2014  Cycle 2 Taxol/carboplatin 11/18/2014  Cycle 3 Taxol/carboplatin 12/09/2014  CT scan 12/23/2014 with interval improvement in peritoneal carcinomatosis with near-complete resolution of ascites and decreased omental nodularity. Significant improvement in malignant right pleural effusion.  Status post robotic-assisted laparoscopic hysterectomy with bilateral salpingoophorectomy, omentectomy, radical tumor debulking 12/29/2014. Per Dr. Serita Grit office note 01/14/2015 cytoreduction was optimal with residual disease remaining only in the 1 mm implants on the small intestine. 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  Cycle 1 adjuvant Taxol/carboplatin 01/20/2015  Cycle 2 adjuvant Taxol/carboplatin 02/10/2015 2. COPD 3. Dyspnea secondary to COPD and the large right pleural effusion, status post therapeutic thoracentesis procedures 09/30/2014,10/09/2014, and 10/21/2014. Left thoracentesis 10/30/2014 4. Left nephrectomy as a child 5. Delayed nausea following cycle 1 Taxol/carboplatin, Aloxi/Emend added with cycle 2 with improvement. 6. Right lower extremity edema, right calf pain 12/09/2014. Negative venous Doppler 12/09/2014. 7. Diffuse pruritus following cycle 1 adjuvant Taxol/carboplatin 01/20/2015, no rash, resolved with a sterile dose pack 8. Thumb cytopenia second to chemotherapy, the carboplatin will be dose reduced with cycle 2 adjuvant Taxol/carboplatin  02/10/2015   Disposition:  Ms. Manges appears stable. The etiology of the pruritus following the last cycle of chemotherapy is unclear. She did not have a rash to suggest a "drug "reaction.  The pruritus could be related to a delayed reaction to Taxol or carboplatin. She will receive a carboplatin skin test prior to chemotherapy today.  She has thrombocytopenia secured to chemotherapy. We decided to proceed with chemotherapy and dose reduce the carboplatin today. She will return for a CBC in 2 weeks.  She will contact us for spontaneous bruising or bleeding. Betsy Coder, MD  02/10/2015  9:43 AM

## 2015-02-10 NOTE — Telephone Encounter (Signed)
Pt will get new sched in chemo

## 2015-02-11 ENCOUNTER — Telehealth: Payer: Self-pay | Admitting: Oncology

## 2015-02-11 NOTE — Telephone Encounter (Signed)
Pt called to r/s labs later in the day, pt confirmed new time... Lynn Ramirez

## 2015-02-12 ENCOUNTER — Telehealth: Payer: Self-pay | Admitting: *Deleted

## 2015-02-12 MED ORDER — METHYLPREDNISOLONE 4 MG PO TBPK: ORAL_TABLET | ORAL | Status: DC

## 2015-02-12 NOTE — Telephone Encounter (Signed)
[name] received Taxol/carboplatin Cycle 5 on 02-10-2015.  "I started itching 02-11-2015 about 8:00 pm like I did with my last chemo treatment.  I do not want to itch over the weekend.  I have used benadryl cream. Pills and this morning took an Aveeno bath.  I itch on my chest, boobs, abdomen, arms, thighs and a little on my back.  My husband and I used a flashlight and there are no bumps or redness except where I rub or scratch.  I only slept four hours last night.  The last time I had to take prednisone. "  Return number is [phone].

## 2015-02-12 NOTE — Addendum Note (Signed)
Addended by: Brien Few on: 02/12/2015 12:24 PM   Modules accepted: Orders

## 2015-02-12 NOTE — Telephone Encounter (Signed)
Called pt with instructions to complete a medrol dose pack. She voiced understanding. Rx sent electronically.

## 2015-02-16 ENCOUNTER — Encounter (HOSPITAL_COMMUNITY): Payer: Self-pay

## 2015-02-19 ENCOUNTER — Ambulatory Visit: Payer: Self-pay | Admitting: Genetic Counselor

## 2015-02-19 ENCOUNTER — Telehealth: Payer: Self-pay | Admitting: Genetic Counselor

## 2015-02-19 DIAGNOSIS — Z800 Family history of malignant neoplasm of digestive organs: Secondary | ICD-10-CM

## 2015-02-19 DIAGNOSIS — C5700 Malignant neoplasm of unspecified fallopian tube: Secondary | ICD-10-CM | POA: Insufficient documentation

## 2015-02-19 DIAGNOSIS — Z1379 Encounter for other screening for genetic and chromosomal anomalies: Secondary | ICD-10-CM

## 2015-02-19 DIAGNOSIS — Z803 Family history of malignant neoplasm of breast: Secondary | ICD-10-CM

## 2015-02-19 NOTE — Telephone Encounter (Signed)
Discussed with [name] that her genetic test results were negative for mutations within any of 20 genes on the Breast/Ovarian Cancer Panel that would cause her to be at an increased risk for breast cancer.  We still do not have an explanation for [name]'s ovarian cancer diagnosis, but most cancers happen by chance and this result and the family history (several relatives unaffected by cancer) suggest that this is the case.  We discussed that [name]'s daughters and son would likely not be at increased risk above the general population for cancers related to changes within these genes, unless there is a significant family history on their father's side which would cause [place] to be concerned for hereditary cancer syndromes.  [name]'s daughters should make their doctors aware of the family history of ovarian cancer in their mother.  They are at a somewhat increased risk for ovarian cancer just based on their mother's diagnosis.  There is a maternal first cousin, once-removed who was diagnosed and passed away with breast cancer at a young age, but this is likely not a concern for their family because of the distance in relation and the lack of other affected relatives.

## 2015-02-19 NOTE — Progress Notes (Signed)
GENETIC TEST RESULTS  HPI: Lynn Ramirez was previously seen in the [place] clinic due to a personal history of fallopian tube cancer, family history of cancer, and concerns regarding a hereditary predisposition to cancer. Please refer to our prior cancer genetics clinic note from February 04, 2015 for more information regarding Lynn Ramirez's medical, social and family histories, and our assessment and recommendations, at the time. Lynn Ramirez's recent genetic test results were disclosed to her, as were recommendations warranted by these results. These results and recommendations are discussed in more detail below.  GENETIC TEST RESULTS: At the time of Lynn Ramirez's visit on 02/04/15, we recommended she pursue genetic testing of the 20-gene Breast/Ovarian Cancer Panel through [facility] Hope Pigeon, MD).  The Breast/Ovarian gene panel offered by [facility] includes sequencing and deletion/duplication analysis for the following 19 genes:  ATM, BARD1, BRCA1, BRCA2, BRIP1, CDH1, CHEK2, FANCC, MLH1, MSH2, MSH6, NBN, PALB2, PMS2, PTEN, RAD51C, RAD51D, TP53, and XRCC2.  This panel also includes deletion/duplication analysis (without sequencing) for one gene, EPCAM.  Those results are now back, the report date for which is February 18, 2015.  Genetic testing was normal, and did not reveal a deleterious mutation in these genes.  Additionally, no variants of uncertain significance (VUSs) were found.  The test report will be scanned into EPIC and will be located under the Results Review tab in the Surgical Pathology>Molecular Pathology section.   We discussed with Lynn Ramirez that since the current genetic testing is not perfect, it is possible there may be a gene mutation in one of these genes that current testing cannot detect, but that chance is small. We also discussed, that it is possible that another gene that has not yet been discovered, or that we have not yet tested, is  responsible for the cancer diagnoses in the family, and it is, therefore, important to remain in touch with cancer genetics in the future so that we can continue to offer Ms. Shroff the most up to date genetic testing.   CANCER SCREENING RECOMMENDATIONS: While we still do not have an explanation for why Lynn Ramirez was diagnosed with her fallopian tube cancer, this result is reassuring and indicates that Lynn Ramirez likely does not have an increased risk for a future cancer due to a mutation in one of these genes. This normal test also suggests that Lynn Ramirez's cancer was most likely not due to an inherited predisposition associated with one of these genes.  Furthermore, many family members have not had cancer.  Most cancers happen by chance and this negative test as well as the family history suggests that her cancer falls into this category.  We, therefore, recommended she continue to follow the cancer management and screening guidelines provided by her oncology and primary healthcare providers.   RECOMMENDATIONS FOR FAMILY MEMBERS: Women in this family might be at some increased risk of developing cancer, over the general population risk, simply due to the family history of cancer. Lynn Ramirez's daughters should make their primary care physicians aware of this family history (as well as of her mother's negative genetic test results).  Women in this family should have mammograms and gynecological exams as recommended by their primary providers. All family members should have a colonoscopy by age 82.   FOLLOW-UP: Lastly, we discussed with Lynn Ramirez that cancer genetics is a rapidly advancing field and it is possible that new genetic tests will be appropriate for her and/or her family members in the future. We  encouraged her to remain in contact with cancer genetics on an annual basis so we can update her personal and family histories and let her know of advances in cancer genetics that may  benefit this family.   Our contact number was provided. Lynn Ramirez's questions were answered to her satisfaction, and she knows she is welcome to call us at anytime with additional questions or concerns.   Jeanine Luz, MS Genetic Counselor [email or web address] Phone: [phone]

## 2015-02-24 ENCOUNTER — Other Ambulatory Visit: Payer: Self-pay | Admitting: *Deleted

## 2015-02-24 ENCOUNTER — Other Ambulatory Visit (HOSPITAL_BASED_OUTPATIENT_CLINIC_OR_DEPARTMENT_OTHER): Payer: Medicare Other

## 2015-02-24 DIAGNOSIS — J910 Malignant pleural effusion: Secondary | ICD-10-CM

## 2015-02-24 DIAGNOSIS — C482 Malignant neoplasm of peritoneum, unspecified: Secondary | ICD-10-CM | POA: Diagnosis present

## 2015-02-24 DIAGNOSIS — D6959 Other secondary thrombocytopenia: Secondary | ICD-10-CM | POA: Diagnosis not present

## 2015-02-24 DIAGNOSIS — D709 Neutropenia, unspecified: Secondary | ICD-10-CM

## 2015-02-24 DIAGNOSIS — C799 Secondary malignant neoplasm of unspecified site: Secondary | ICD-10-CM

## 2015-02-24 LAB — CBC WITH DIFFERENTIAL/PLATELET
BASO%: 3.2 % — ABNORMAL HIGH (ref 0.0–2.0)
BASOS ABS: 0 10*3/uL (ref 0.0–0.1)
EOS ABS: 0 10*3/uL (ref 0.0–0.5)
EOS%: 0 % (ref 0.0–7.0)
HCT: 29.9 % — ABNORMAL LOW (ref 34.8–46.6)
HGB: 9.6 g/dL — ABNORMAL LOW (ref 11.6–15.9)
LYMPH%: 55.8 % — ABNORMAL HIGH (ref 14.0–49.7)
MCH: 32.9 pg (ref 25.1–34.0)
MCHC: 32.1 g/dL (ref 31.5–36.0)
MCV: 102.4 fL — AB (ref 79.5–101.0)
MONO#: 0.3 10*3/uL (ref 0.1–0.9)
MONO%: 28.4 % — AB (ref 0.0–14.0)
NEUT#: 0.1 10*3/uL — CL (ref 1.5–6.5)
NEUT%: 12.6 % — ABNORMAL LOW (ref 38.4–76.8)
PLATELETS: 85 10*3/uL — AB (ref 145–400)
RBC: 2.92 10*6/uL — AB (ref 3.70–5.45)
RDW: 17.1 % — AB (ref 11.2–14.5)
WBC: 1 10*3/uL — ABNORMAL LOW (ref 3.9–10.3)
lymph#: 0.5 10*3/uL — ABNORMAL LOW (ref 0.9–3.3)
nRBC: 0 % (ref 0–0)

## 2015-02-24 MED ORDER — CIPROFLOXACIN HCL 500 MG PO TABS: 500.0000 mg | ORAL_TABLET | Freq: Two times a day (BID) | ORAL | Status: DC

## 2015-02-24 NOTE — Progress Notes (Unsigned)
Pt came in to have labs drawn and reported that she was feeling very fatigued since her treatment 8/3. Pt denies any pain, UTI symptoms, cough, congestion, N/V. Pt vitals checked- BP 138/71 pulse 104 Temp 98.3   Spoke to Ned Card, NP VO taken for Cipro 500mg  BID for 7 days and pt to rtn on Friday for CBC. Pt will stay for results and advise about traveling on Tuesday to [place]. POF and orders entered.

## 2015-02-25 ENCOUNTER — Telehealth: Payer: Self-pay | Admitting: *Deleted

## 2015-02-25 NOTE — Telephone Encounter (Signed)
LM  For pt to confirm lab appt for Friday 8/19 at 945

## 2015-02-26 ENCOUNTER — Telehealth: Payer: Self-pay | Admitting: Oncology

## 2015-02-26 ENCOUNTER — Telehealth: Payer: Self-pay | Admitting: *Deleted

## 2015-02-26 ENCOUNTER — Other Ambulatory Visit: Payer: Self-pay | Admitting: *Deleted

## 2015-02-26 ENCOUNTER — Other Ambulatory Visit (HOSPITAL_BASED_OUTPATIENT_CLINIC_OR_DEPARTMENT_OTHER): Payer: Medicare Other

## 2015-02-26 DIAGNOSIS — C482 Malignant neoplasm of peritoneum, unspecified: Secondary | ICD-10-CM | POA: Diagnosis present

## 2015-02-26 DIAGNOSIS — D6959 Other secondary thrombocytopenia: Secondary | ICD-10-CM | POA: Diagnosis not present

## 2015-02-26 DIAGNOSIS — C787 Secondary malignant neoplasm of liver and intrahepatic bile duct: Secondary | ICD-10-CM

## 2015-02-26 DIAGNOSIS — D709 Neutropenia, unspecified: Secondary | ICD-10-CM

## 2015-02-26 DIAGNOSIS — C799 Secondary malignant neoplasm of unspecified site: Secondary | ICD-10-CM

## 2015-02-26 LAB — CBC WITH DIFFERENTIAL/PLATELET
BASO%: 0.7 % (ref 0.0–2.0)
BASOS ABS: 0 10*3/uL (ref 0.0–0.1)
EOS ABS: 0 10*3/uL (ref 0.0–0.5)
EOS%: 0 % (ref 0.0–7.0)
HEMATOCRIT: 31 % — AB (ref 34.8–46.6)
HGB: 10.1 g/dL — ABNORMAL LOW (ref 11.6–15.9)
LYMPH%: 40.6 % (ref 14.0–49.7)
MCH: 32.6 pg (ref 25.1–34.0)
MCHC: 32.6 g/dL (ref 31.5–36.0)
MCV: 100 fL (ref 79.5–101.0)
MONO#: 0.3 10*3/uL (ref 0.1–0.9)
MONO%: 19.6 % — ABNORMAL HIGH (ref 0.0–14.0)
NEUT%: 39.1 % (ref 38.4–76.8)
NEUTROS ABS: 0.5 10*3/uL — AB (ref 1.5–6.5)
NRBC: 0 % (ref 0–0)
PLATELETS: 80 10*3/uL — AB (ref 145–400)
RBC: 3.1 10*6/uL — ABNORMAL LOW (ref 3.70–5.45)
RDW: 16.7 % — AB (ref 11.2–14.5)
WBC: 1.4 10*3/uL — AB (ref 3.9–10.3)
lymph#: 0.6 10*3/uL — ABNORMAL LOW (ref 0.9–3.3)

## 2015-02-26 NOTE — Telephone Encounter (Signed)
Called patient at 12 with Dr. Gearldine Shown orders that it is okay to stop Cipro.  She is very thankful stating bowels have moved three more times since we spoke earlier.

## 2015-02-26 NOTE — Telephone Encounter (Signed)
Labs added 08/23 per 08/19 POF addendum, pt is aware.Marland Kitchen KJ

## 2015-02-26 NOTE — Progress Notes (Signed)
Addendum: error to POF and note, should be 8/23 at 9am, keep 8/31 appts as scheduled. Clarified with Maudie Mercury in scheduling.

## 2015-02-26 NOTE — Progress Notes (Signed)
Per Dr. Benay Spice, pt to continue using neutropenic precautions, may continue cipro through Sunday; return Tuesday 03/09/15 at [facility] for lab draw to recheck CBC. Pt voices understanding of instruction and knows to call us if she develops a fever or any other complications.

## 2015-02-26 NOTE — Telephone Encounter (Signed)
"  I need to know If it is okay for me to stop the Cipro now instead of Sunday.  I started Cipro on Wednesday.  I had one BM Wed, Thurs and have already had three BM's today so I started Imodium."  This nurse advised she eat yogurt and continue through Sunday at least.  "I have an inner sphincter problem and wear pads."  No further questions or complaints.

## 2015-02-26 NOTE — Telephone Encounter (Signed)
Ok to stop cipro

## 2015-02-28 ENCOUNTER — Other Ambulatory Visit: Payer: Self-pay | Admitting: Oncology

## 2015-03-02 ENCOUNTER — Other Ambulatory Visit (HOSPITAL_BASED_OUTPATIENT_CLINIC_OR_DEPARTMENT_OTHER): Payer: Medicare Other

## 2015-03-02 ENCOUNTER — Telehealth: Payer: Self-pay | Admitting: *Deleted

## 2015-03-02 DIAGNOSIS — C482 Malignant neoplasm of peritoneum, unspecified: Secondary | ICD-10-CM

## 2015-03-02 DIAGNOSIS — D6959 Other secondary thrombocytopenia: Secondary | ICD-10-CM | POA: Diagnosis not present

## 2015-03-02 DIAGNOSIS — C799 Secondary malignant neoplasm of unspecified site: Secondary | ICD-10-CM

## 2015-03-02 LAB — CBC WITH DIFFERENTIAL/PLATELET
BASO%: 0.6 % (ref 0.0–2.0)
BASOS ABS: 0 10*3/uL (ref 0.0–0.1)
EOS%: 0 % (ref 0.0–7.0)
Eosinophils Absolute: 0 10*3/uL (ref 0.0–0.5)
HEMATOCRIT: 29.7 % — AB (ref 34.8–46.6)
HEMOGLOBIN: 9.4 g/dL — AB (ref 11.6–15.9)
LYMPH#: 1.6 10*3/uL (ref 0.9–3.3)
LYMPH%: 31.3 % (ref 14.0–49.7)
MCH: 32.6 pg (ref 25.1–34.0)
MCHC: 31.6 g/dL (ref 31.5–36.0)
MCV: 103.1 fL — AB (ref 79.5–101.0)
MONO#: 0.6 10*3/uL (ref 0.1–0.9)
MONO%: 11.6 % (ref 0.0–14.0)
NEUT#: 2.8 10*3/uL (ref 1.5–6.5)
NEUT%: 56.5 % (ref 38.4–76.8)
PLATELETS: 113 10*3/uL — AB (ref 145–400)
RBC: 2.88 10*6/uL — ABNORMAL LOW (ref 3.70–5.45)
RDW: 17.3 % — ABNORMAL HIGH (ref 11.2–14.5)
WBC: 5 10*3/uL (ref 3.9–10.3)
nRBC: 1 % — ABNORMAL HIGH (ref 0–0)

## 2015-03-02 NOTE — Telephone Encounter (Signed)
Per Dr. Benay Spice; notified pt in lobby that labs are good and she can go on her trip; reviewed neutropenic precautions; call for fever/chills.  Pt "so glad" verbalized understanding; copy of labs given to pt and confirmed apt for 03/10/15.

## 2015-03-06 ENCOUNTER — Other Ambulatory Visit: Payer: Self-pay | Admitting: Oncology

## 2015-03-10 ENCOUNTER — Ambulatory Visit (HOSPITAL_BASED_OUTPATIENT_CLINIC_OR_DEPARTMENT_OTHER): Payer: Medicare Other

## 2015-03-10 ENCOUNTER — Telehealth: Payer: Self-pay | Admitting: Oncology

## 2015-03-10 ENCOUNTER — Ambulatory Visit (HOSPITAL_BASED_OUTPATIENT_CLINIC_OR_DEPARTMENT_OTHER): Payer: Medicare Other | Admitting: Oncology

## 2015-03-10 ENCOUNTER — Other Ambulatory Visit (HOSPITAL_BASED_OUTPATIENT_CLINIC_OR_DEPARTMENT_OTHER): Payer: Medicare Other

## 2015-03-10 VITALS — BP 156/70 | HR 90 | Temp 97.7°F | Resp 18 | Ht 60.0 in | Wt 176.9 lb

## 2015-03-10 DIAGNOSIS — Z452 Encounter for adjustment and management of vascular access device: Secondary | ICD-10-CM

## 2015-03-10 DIAGNOSIS — C482 Malignant neoplasm of peritoneum, unspecified: Secondary | ICD-10-CM

## 2015-03-10 DIAGNOSIS — J910 Malignant pleural effusion: Secondary | ICD-10-CM

## 2015-03-10 DIAGNOSIS — G620 Drug-induced polyneuropathy: Secondary | ICD-10-CM | POA: Diagnosis not present

## 2015-03-10 DIAGNOSIS — C569 Malignant neoplasm of unspecified ovary: Secondary | ICD-10-CM

## 2015-03-10 DIAGNOSIS — Z95828 Presence of other vascular implants and grafts: Secondary | ICD-10-CM

## 2015-03-10 DIAGNOSIS — Z5111 Encounter for antineoplastic chemotherapy: Secondary | ICD-10-CM

## 2015-03-10 DIAGNOSIS — D701 Agranulocytosis secondary to cancer chemotherapy: Secondary | ICD-10-CM

## 2015-03-10 DIAGNOSIS — C799 Secondary malignant neoplasm of unspecified site: Secondary | ICD-10-CM

## 2015-03-10 DIAGNOSIS — C786 Secondary malignant neoplasm of retroperitoneum and peritoneum: Secondary | ICD-10-CM | POA: Diagnosis not present

## 2015-03-10 DIAGNOSIS — C801 Malignant (primary) neoplasm, unspecified: Secondary | ICD-10-CM | POA: Diagnosis not present

## 2015-03-10 LAB — COMPREHENSIVE METABOLIC PANEL (CC13)
ALBUMIN: 3.4 g/dL — AB (ref 3.5–5.0)
ALK PHOS: 91 U/L (ref 40–150)
ALT: 14 U/L (ref 0–55)
ANION GAP: 9 meq/L (ref 3–11)
AST: 13 U/L (ref 5–34)
BILIRUBIN TOTAL: 0.3 mg/dL (ref 0.20–1.20)
BUN: 24.1 mg/dL (ref 7.0–26.0)
CALCIUM: 10 mg/dL (ref 8.4–10.4)
CO2: 29 mEq/L (ref 22–29)
Chloride: 104 mEq/L (ref 98–109)
Creatinine: 1 mg/dL (ref 0.6–1.1)
EGFR: 56 mL/min/{1.73_m2} — AB (ref >90)
Glucose: 125 mg/dl (ref 70–140)
Potassium: 4 mEq/L (ref 3.5–5.1)
Sodium: 142 mEq/L (ref 136–145)
TOTAL PROTEIN: 6.8 g/dL (ref 6.4–8.3)

## 2015-03-10 LAB — CBC WITH DIFFERENTIAL/PLATELET
BASO%: 0.4 % (ref 0.0–2.0)
Basophils Absolute: 0 10*3/uL (ref 0.0–0.1)
EOS ABS: 0 10*3/uL (ref 0.0–0.5)
EOS%: 0.9 % (ref 0.0–7.0)
HCT: 29.5 % — ABNORMAL LOW (ref 34.8–46.6)
HGB: 9.6 g/dL — ABNORMAL LOW (ref 11.6–15.9)
LYMPH%: 21.6 % (ref 14.0–49.7)
MCH: 33.6 pg (ref 25.1–34.0)
MCHC: 32.6 g/dL (ref 31.5–36.0)
MCV: 103.2 fL — AB (ref 79.5–101.0)
MONO#: 0.5 10*3/uL (ref 0.1–0.9)
MONO%: 9 % (ref 0.0–14.0)
NEUT%: 68.1 % (ref 38.4–76.8)
NEUTROS ABS: 3.7 10*3/uL (ref 1.5–6.5)
PLATELETS: 193 10*3/uL (ref 145–400)
RBC: 2.86 10*6/uL — AB (ref 3.70–5.45)
RDW: 19.4 % — ABNORMAL HIGH (ref 11.2–14.5)
WBC: 5.5 10*3/uL (ref 3.9–10.3)
lymph#: 1.2 10*3/uL (ref 0.9–3.3)

## 2015-03-10 LAB — CA 125: CA 125: 36 U/mL — AB (ref <35)

## 2015-03-10 MED ORDER — HEPARIN SOD (PORK) LOCK FLUSH 100 UNIT/ML IV SOLN
500.0000 [IU] | Freq: Once | INTRAVENOUS | Status: AC | PRN
  Administered 2015-03-10: 500 [IU]
  Filled 2015-03-10: qty 5

## 2015-03-10 MED ORDER — DIPHENHYDRAMINE HCL 50 MG/ML IJ SOLN
12.5000 mg | Freq: Once | INTRAMUSCULAR | Status: AC
  Administered 2015-03-10: 12.5 mg via INTRAVENOUS

## 2015-03-10 MED ORDER — DIPHENHYDRAMINE HCL 50 MG/ML IJ SOLN
INTRAMUSCULAR | Status: AC
  Filled 2015-03-10: qty 1

## 2015-03-10 MED ORDER — PEGFILGRASTIM INJECTION 6 MG/0.6ML ~~LOC~~: 6.0000 mg | PREFILLED_SYRINGE | Freq: Once | SUBCUTANEOUS | Status: DC

## 2015-03-10 MED ORDER — LORAZEPAM 1 MG PO TABS
0.5000 mg | ORAL_TABLET | Freq: Once | ORAL | Status: AC
  Administered 2015-03-10: 0.5 mg via ORAL

## 2015-03-10 MED ORDER — FAMOTIDINE IN NACL 20-0.9 MG/50ML-% IV SOLN
20.0000 mg | Freq: Once | INTRAVENOUS | Status: AC
  Administered 2015-03-10: 20 mg via INTRAVENOUS

## 2015-03-10 MED ORDER — SODIUM CHLORIDE 0.9 % IJ SOLN
10.0000 mL | INTRAMUSCULAR | Status: DC | PRN
  Administered 2015-03-10: 10 mL via INTRAVENOUS
  Filled 2015-03-10: qty 10

## 2015-03-10 MED ORDER — SODIUM CHLORIDE 0.9 % IV SOLN
370.0000 mg | Freq: Once | INTRAVENOUS | Status: AC
  Administered 2015-03-10: 370 mg via INTRAVENOUS
  Filled 2015-03-10: qty 37

## 2015-03-10 MED ORDER — PALONOSETRON HCL INJECTION 0.25 MG/5ML
INTRAVENOUS | Status: AC
  Filled 2015-03-10: qty 5

## 2015-03-10 MED ORDER — SODIUM CHLORIDE 0.9 % IV SOLN
Freq: Once | INTRAVENOUS | Status: AC
  Administered 2015-03-10: 11:00:00 via INTRAVENOUS

## 2015-03-10 MED ORDER — LORAZEPAM 1 MG PO TABS
ORAL_TABLET | ORAL | Status: AC
  Filled 2015-03-10: qty 1

## 2015-03-10 MED ORDER — SODIUM CHLORIDE 0.9 % IJ SOLN
10.0000 mL | INTRAMUSCULAR | Status: DC | PRN
  Administered 2015-03-10: 10 mL
  Filled 2015-03-10: qty 10

## 2015-03-10 MED ORDER — PACLITAXEL CHEMO INJECTION 300 MG/50ML
175.0000 mg/m2 | Freq: Once | INTRAVENOUS | Status: AC
  Administered 2015-03-10: 330 mg via INTRAVENOUS
  Filled 2015-03-10: qty 55

## 2015-03-10 MED ORDER — SODIUM CHLORIDE 0.9 % IV SOLN
Freq: Once | INTRAVENOUS | Status: AC
  Administered 2015-03-10: 12:00:00 via INTRAVENOUS
  Filled 2015-03-10: qty 5

## 2015-03-10 MED ORDER — FAMOTIDINE IN NACL 20-0.9 MG/50ML-% IV SOLN
INTRAVENOUS | Status: AC
  Filled 2015-03-10: qty 50

## 2015-03-10 MED ORDER — PALONOSETRON HCL INJECTION 0.25 MG/5ML
0.2500 mg | Freq: Once | INTRAVENOUS | Status: AC
  Administered 2015-03-10: 0.25 mg via INTRAVENOUS

## 2015-03-10 MED ORDER — ALTEPLASE 2 MG IJ SOLR
2.0000 mg | Freq: Once | INTRAMUSCULAR | Status: AC | PRN
Start: 2015-03-10 — End: 2015-03-10
  Administered 2015-03-10: 2 mg
  Filled 2015-03-10: qty 2

## 2015-03-10 MED ORDER — CARBOPLATIN CHEMO INTRADERMAL TEST DOSE 100MCG/0.02ML
100.0000 ug | Freq: Once | INTRADERMAL | Status: AC
  Administered 2015-03-10: 100 ug via INTRADERMAL
  Filled 2015-03-10: qty 0.01

## 2015-03-10 NOTE — Progress Notes (Signed)
Pt PAC accessed for labs/ Infusion today. Pt accessed without any difficulties, port flushes well. No blood return was noted. Attempted multiple maneuvers for blood return with no luck. Pt states she always has a hard time getting blood return from her port. Called and informed Lavella Lemons, RN with Dr. Benay Spice. Pt had lab draw peripherally by phlebotomist and Delle Reining, RN placed TPA In port.

## 2015-03-10 NOTE — Telephone Encounter (Signed)
per pof to sch tp appt-gave pt copy of avs °

## 2015-03-10 NOTE — Progress Notes (Signed)
Patient c/o restless legs from benadryl.  PO Ativan 0.5mg  has helped before.  VO given and read back for Ativan 0.5mg  po from Selena Lesser NP [id number]:  Patient reports her legs feel much better

## 2015-03-10 NOTE — Progress Notes (Signed)
[facility] OFFICE PROGRESS NOTE   Diagnosis: Ovarian cancer  INTERVAL HISTORY:   She returns as scheduled. She completed another cycle of Taxol/carboplatin 02/10/2015. She developed severe neutropenia following chemotherapy was placed on prophylactic ciprofloxacin. She had diarrhea after taking ciprofloxacin. She returned from a trip to [place]. She reports mild increased dyspnea. No abdominal distention. She has increased tingling and numbness in the hands and feet. This does not interfere with activity. She again developed pruritus following chemotherapy. This improved with a steroid Dosepak. Objective:  Vital signs in last 24 hours:  Blood pressure 156/70, pulse 90, temperature 97.7 F (36.5 C), temperature source Oral, resp. rate 18, height 5' (1.524 m), weight 176 lb 14.4 oz (80.241 kg), SpO2 99 %.    HEENT: No thrush Resp: Distant breath sounds, no respiratory distress, good air movement bilaterally Cardio: Regular rate and rhythm GI: No hepatosplenomegaly, no apparent ascites, no mass Vascular: No leg edema Neuro: Mild decrease in vibratory sense at the fingertips bilaterally     Portacath/PICC-without erythema  Lab Results:  Lab Results  Component Value Date   WBC 5.5 03/10/2015   HGB 9.6* 03/10/2015   HCT 29.5* 03/10/2015   MCV 103.2* 03/10/2015   PLT 193 03/10/2015   NEUTROABS 3.7 03/10/2015    02/10/2015-CA 125     31   Medications: I have reviewed the patient's current medications.  Assessment/Plan: 1. 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, immunohistochemical profile consistent with a GYN primary, elevated CA 125  Staging CTs of the chest, abdomen, and pelvis on 10/06/2014 revealed a loculated right pleural effusion, ascites, and omental/mesenteric thickening  Cytology from peritoneal fluid 10/13/2014 revealed malignant cells consistent with metastatic  adenocarcinoma  Biopsy of an omental mass on 11/02/2014 revealed invasive serous carcinoma with psammoma bodies  Cycle 1 Taxol/carboplatin 10/28/2014  Cycle 2 Taxol/carboplatin 11/18/2014  Cycle 3 Taxol/carboplatin 12/09/2014  CT scan 12/23/2014 with interval improvement in peritoneal carcinomatosis with near-complete resolution of ascites and decreased omental nodularity. Significant improvement in malignant right pleural effusion.  Status post robotic-assisted laparoscopic hysterectomy with bilateral salpingoophorectomy, omentectomy, radical tumor debulking 12/29/2014. Per Dr. Serita Grit office note 01/14/2015 cytoreduction was optimal with residual disease remaining only in the 1 mm implants on the small intestine. 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  Cycle 1 adjuvant Taxol/carboplatin 01/20/2015  Cycle 2 adjuvant Taxol/carboplatin 02/10/2015  Cycle 3 adjuvant Taxol/carboplatin 03/10/2015 2. COPD 3. Dyspnea secondary to COPD and the large right pleural effusion, status post therapeutic thoracentesis procedures 09/30/2014,10/09/2014, and 10/21/2014. Left thoracentesis 10/30/2014 4. Left nephrectomy as a child 5. Delayed nausea following cycle 1 Taxol/carboplatin, Aloxi/Emend added with cycle 2 with improvement. 6. Right lower extremity edema, right calf pain 12/09/2014. Negative venous Doppler 12/09/2014. 7. Diffuse pruritus following cycle 1 adjuvant Taxol/carboplatin 01/20/2015, no rash, resolved with a steroid dose pack 8. Thumb cytopenia second to chemotherapy, the carboplatin will be dose reduced with cycle 2 adjuvant Taxol/carboplatin 02/10/2015 9. Chemotherapy-induced peripheral neuropathy,  mild   Disposition:  Ms. Virgen has completed 5 cycles of Taxol/carboplatin. She has developed mild peripheral neuropathy. We discussed the potential for worsening neuropathy with further chemotherapy. She would like to continue chemotherapy. She will complete a final planned cycle of Taxol/carboplatin today. She developed severe neutropenia following cycle 5. She  will receive Neulasta with cycle 6. We discussed the potential toxicities associated with Neulasta. She agrees to proceed.  Ms.Dominique will return for an office visit and CEA 125 in 6 weeks. She will return for a CBC in 2 weeks. We prescribed a Medrol Dosepak to take after the cycle of chemotherapy.  Betsy Coder, MD  03/10/2015  10:08 AM

## 2015-03-10 NOTE — Patient Instructions (Signed)

## 2015-03-10 NOTE — Patient Instructions (Signed)
[facility] Discharge Instructions for Patients Receiving Chemotherapy  Today you received the following chemotherapy agents taxol/carbo  To help prevent nausea and vomiting after your treatment, we encourage you to take your nausea medication as directed   If you develop nausea and vomiting that is not controlled by your nausea medication, call the clinic.   BELOW ARE SYMPTOMS THAT SHOULD BE REPORTED IMMEDIATELY:  *FEVER GREATER THAN 100.5 F  *CHILLS WITH OR WITHOUT FEVER  NAUSEA AND VOMITING THAT IS NOT CONTROLLED WITH YOUR NAUSEA MEDICATION  *UNUSUAL SHORTNESS OF BREATH  *UNUSUAL BRUISING OR BLEEDING  TENDERNESS IN MOUTH AND THROAT WITH OR WITHOUT PRESENCE OF ULCERS  *URINARY PROBLEMS  *BOWEL PROBLEMS  UNUSUAL RASH Items with * indicate a potential emergency and should be followed up as soon as possible.  Feel free to call the clinic you have any questions or concerns. The clinic phone number is [phone].  Please show the CHEMO ALERT CARD at check-in to the Emergency Department and triage nurse.   

## 2015-03-11 ENCOUNTER — Encounter (HOSPITAL_COMMUNITY): Payer: Self-pay

## 2015-03-11 ENCOUNTER — Ambulatory Visit: Payer: Medicare Other

## 2015-03-11 ENCOUNTER — Telehealth: Payer: Self-pay | Admitting: *Deleted

## 2015-03-11 NOTE — Telephone Encounter (Signed)
-----   Message from Lynn Pier, MD sent at 03/11/2015  2:16 PM EDT ----- Please call patient, ca125 is stable, at upper end of normal range, repeat next visit

## 2015-03-12 ENCOUNTER — Ambulatory Visit (HOSPITAL_BASED_OUTPATIENT_CLINIC_OR_DEPARTMENT_OTHER): Payer: Medicare Other

## 2015-03-12 ENCOUNTER — Ambulatory Visit: Payer: Medicare Other

## 2015-03-12 ENCOUNTER — Encounter: Payer: Self-pay | Admitting: Oncology

## 2015-03-12 VITALS — BP 139/63 | HR 84 | Temp 98.4°F

## 2015-03-12 DIAGNOSIS — C482 Malignant neoplasm of peritoneum, unspecified: Secondary | ICD-10-CM

## 2015-03-12 DIAGNOSIS — Z5189 Encounter for other specified aftercare: Secondary | ICD-10-CM

## 2015-03-12 DIAGNOSIS — C799 Secondary malignant neoplasm of unspecified site: Secondary | ICD-10-CM

## 2015-03-12 MED ORDER — PEGFILGRASTIM INJECTION 6 MG/0.6ML
6.0000 mg | Freq: Once | SUBCUTANEOUS | Status: AC
  Administered 2015-03-12: 6 mg via SUBCUTANEOUS
  Filled 2015-03-12: qty 0.6

## 2015-03-12 NOTE — Telephone Encounter (Signed)
Ms Slagel came for her injection and asked about call from [place].  Gave results of CA 125 to patient and told her what Dr Ammie Dalton had said about results.  Tykeisha states understanding.

## 2015-03-12 NOTE — Progress Notes (Signed)
Checked in pt for injection for front desk. Pt signed AOB.

## 2015-03-16 ENCOUNTER — Telehealth: Payer: Self-pay | Admitting: *Deleted

## 2015-03-16 NOTE — Telephone Encounter (Signed)
Pt called asking if it was okay for her to schedule her 3-D mammogram. I told her this was okay. She appreciated advice and stated she would call us when this is completed.

## 2015-03-16 NOTE — Telephone Encounter (Signed)
Opened in error

## 2015-03-18 DIAGNOSIS — J449 Chronic obstructive pulmonary disease, unspecified: Secondary | ICD-10-CM | POA: Diagnosis not present

## 2015-03-18 DIAGNOSIS — E785 Hyperlipidemia, unspecified: Secondary | ICD-10-CM | POA: Diagnosis not present

## 2015-03-18 DIAGNOSIS — R7301 Impaired fasting glucose: Secondary | ICD-10-CM | POA: Diagnosis not present

## 2015-03-18 DIAGNOSIS — I10 Essential (primary) hypertension: Secondary | ICD-10-CM | POA: Diagnosis not present

## 2015-03-18 DIAGNOSIS — Z1239 Encounter for other screening for malignant neoplasm of breast: Secondary | ICD-10-CM | POA: Diagnosis not present

## 2015-03-18 DIAGNOSIS — C569 Malignant neoplasm of unspecified ovary: Secondary | ICD-10-CM | POA: Diagnosis not present

## 2015-03-18 DIAGNOSIS — R938 Abnormal findings on diagnostic imaging of other specified body structures: Secondary | ICD-10-CM | POA: Diagnosis not present

## 2015-03-19 ENCOUNTER — Other Ambulatory Visit: Payer: Self-pay | Admitting: *Deleted

## 2015-03-19 ENCOUNTER — Telehealth: Payer: Self-pay | Admitting: *Deleted

## 2015-03-19 DIAGNOSIS — J449 Chronic obstructive pulmonary disease, unspecified: Secondary | ICD-10-CM

## 2015-03-19 DIAGNOSIS — Z0000 Encounter for general adult medical examination without abnormal findings: Secondary | ICD-10-CM

## 2015-03-19 DIAGNOSIS — C5700 Malignant neoplasm of unspecified fallopian tube: Secondary | ICD-10-CM

## 2015-03-19 DIAGNOSIS — C569 Malignant neoplasm of unspecified ovary: Secondary | ICD-10-CM

## 2015-03-19 DIAGNOSIS — C799 Secondary malignant neoplasm of unspecified site: Secondary | ICD-10-CM

## 2015-03-19 NOTE — Telephone Encounter (Signed)
TC from patient requesting additional lab work to be done on 03/24/15 when she is here for Chemo related labs.  Her PCP has requested lipid panel, A1C and Vit. B12 level.  She has a prescription for these labs and will bring this with her on 03/24/15.  OK'd this with the lab.

## 2015-03-24 ENCOUNTER — Telehealth: Payer: Self-pay | Admitting: Nurse Practitioner

## 2015-03-24 ENCOUNTER — Other Ambulatory Visit (HOSPITAL_BASED_OUTPATIENT_CLINIC_OR_DEPARTMENT_OTHER): Payer: Medicare Other

## 2015-03-24 ENCOUNTER — Ambulatory Visit (HOSPITAL_BASED_OUTPATIENT_CLINIC_OR_DEPARTMENT_OTHER): Payer: Medicare Other | Admitting: Nurse Practitioner

## 2015-03-24 ENCOUNTER — Other Ambulatory Visit: Payer: Self-pay

## 2015-03-24 VITALS — BP 145/69 | HR 87 | Temp 97.9°F | Resp 18 | Ht 60.0 in | Wt 179.5 lb

## 2015-03-24 DIAGNOSIS — C569 Malignant neoplasm of unspecified ovary: Secondary | ICD-10-CM

## 2015-03-24 DIAGNOSIS — R7301 Impaired fasting glucose: Secondary | ICD-10-CM | POA: Diagnosis not present

## 2015-03-24 DIAGNOSIS — G620 Drug-induced polyneuropathy: Secondary | ICD-10-CM

## 2015-03-24 DIAGNOSIS — C482 Malignant neoplasm of peritoneum, unspecified: Secondary | ICD-10-CM

## 2015-03-24 DIAGNOSIS — T451X5A Adverse effect of antineoplastic and immunosuppressive drugs, initial encounter: Principal | ICD-10-CM

## 2015-03-24 DIAGNOSIS — E785 Hyperlipidemia, unspecified: Secondary | ICD-10-CM | POA: Diagnosis not present

## 2015-03-24 LAB — CBC WITH DIFFERENTIAL/PLATELET
BASO%: 0.6 % (ref 0.0–2.0)
BASOS ABS: 0 10*3/uL (ref 0.0–0.1)
EOS%: 0.6 % (ref 0.0–7.0)
Eosinophils Absolute: 0 10*3/uL (ref 0.0–0.5)
HEMATOCRIT: 29.4 % — AB (ref 34.8–46.6)
HEMOGLOBIN: 9.4 g/dL — AB (ref 11.6–15.9)
LYMPH#: 1.2 10*3/uL (ref 0.9–3.3)
LYMPH%: 20.7 % (ref 14.0–49.7)
MCH: 33.1 pg (ref 25.1–34.0)
MCHC: 32.1 g/dL (ref 31.5–36.0)
MCV: 103.2 fL — ABNORMAL HIGH (ref 79.5–101.0)
MONO#: 0.5 10*3/uL (ref 0.1–0.9)
MONO%: 8.7 % (ref 0.0–14.0)
NEUT#: 3.9 10*3/uL (ref 1.5–6.5)
NEUT%: 69.4 % (ref 38.4–76.8)
PLATELETS: 70 10*3/uL — AB (ref 145–400)
RBC: 2.85 10*6/uL — ABNORMAL LOW (ref 3.70–5.45)
RDW: 19.3 % — AB (ref 11.2–14.5)
WBC: 5.6 10*3/uL (ref 3.9–10.3)

## 2015-03-24 MED ORDER — GABAPENTIN 300 MG PO CAPS
300.0000 mg | ORAL_CAPSULE | Freq: Two times a day (BID) | ORAL | Status: DC | PRN
Start: 2015-03-24 — End: 2015-03-30

## 2015-03-24 NOTE — Telephone Encounter (Signed)
per pof to sch pt appt w/ Jenny Reichmann Bacon-added appt

## 2015-03-25 ENCOUNTER — Encounter: Payer: Self-pay | Admitting: Nurse Practitioner

## 2015-03-25 DIAGNOSIS — T451X5A Adverse effect of antineoplastic and immunosuppressive drugs, initial encounter: Principal | ICD-10-CM

## 2015-03-25 DIAGNOSIS — G620 Drug-induced polyneuropathy: Secondary | ICD-10-CM | POA: Insufficient documentation

## 2015-03-25 NOTE — Assessment & Plan Note (Signed)
Patient received her final carboplatin/Taxol chemotherapy regimen on 03/10/2015.  She received Neulasta injection following her last chemotherapy as well.  Since patient has now completed her chemotherapy-patient will undergo close observation only.  Patient is complaining of some progressive neuropathy to the soles of her feet only.  Patient states that it feels like she is walking on sand.  She describes the sensation as a numbness and tingling; with pain/discomfort only at night.  Will prescribe Neurontin 300 mg twice daily on an as-needed basis for neuropathy symptoms.

## 2015-03-25 NOTE — Progress Notes (Signed)
SYMPTOM MANAGEMENT CLINIC   HPI: Ziyon Cedotal 74 y.o. female diagnosed with metastatic ovarian cancer.  Patient completed her final carboplatin/Taxol chemotherapy on 03/10/2015.  She is currently undergoing observation only.  Patient received her final carboplatin/Taxol chemotherapy regimen on 03/10/2015.  She received Neulasta injection following her last chemotherapy as well.  Since patient has now completed her chemotherapy-patient will undergo close observation only.  Patient is complaining of some progressive neuropathy to the soles of her feet only.  Patient states that it feels like she is walking on sand.  She describes the sensation as a numbness and tingling; with pain/discomfort only at night.  She denies any fever or chills.   HPI  ROS  Past Medical History  Diagnosis Date  . H/O hydronephrosis   . Hypertension   . Emphysema   . COPD (chronic obstructive pulmonary disease)   . Cancer     ovarian  . Eczema     hands  . GERD (gastroesophageal reflux disease)   . History of transfusion     age 53  . Difficulty sleeping     Past Surgical History  Procedure Laterality Date  . Nephrectomy    . Tonsilectomy, adenoidectomy, bilateral myringotomy and tubes    . Tubal ligation    . Hand surgery Right 1980's  . Thoracentesis      several  . Robotic assisted total hysterectomy with bilateral salpingo oopherectomy Bilateral 12/29/2014    Procedure: ROBOTIC ASSISTED TOTAL LAPAROSCOPIC HYSTERECTOMY WITH BILATERAL SALPINGO OOPHORECTOMY AND OOMENTECTOMY WITH RADICAL TUMOR [place] ;  Surgeon: Everitt Amber, MD;  Location: [facility];  Service: Gynecology;  Laterality: Bilateral;  . Laparotomy N/A 12/29/2014    Procedure:  LAPAROTOMY;  Surgeon: Everitt Amber, MD;  Location: [facility];  Service: Gynecology;  Laterality: N/A;    has COPD (chronic obstructive pulmonary disease); Acute asthmatic bronchitis; Encopresis; COPD exacerbation; Pleural effusion; Pelvic mass in female;  Insomnia; Metastatic adenocarcinoma; Primary peritoneal carcinomatosis; Ascites; Adenocarcinoma; Ovarian cancer; Nausea without vomiting; Hypersensitivity reaction; Genetic testing; Malignant neoplasm of fallopian tube; Family history of pancreatic cancer; Family history of breast cancer in female; and Chemotherapy-induced neuropathy on her problem list.    is allergic to latex and penicillins.    Medication List       This list is accurate as of: 03/24/15 11:59 PM.  Always use your most recent med list.               acetaminophen 325 MG tablet  Commonly known as:  TYLENOL  Take 650 mg by mouth every 6 (six) hours as needed (for pain).     antiseptic oral rinse Liqd  15 mLs by Mouth Rinse route 3 (three) times daily.     Biotin 2500 MCG Caps  Take 1 tablet by mouth daily.     CALCIUM 600 + D PO  Take 1 tablet by mouth daily.     cholecalciferol 1000 UNITS tablet  Commonly known as:  VITAMIN D  Take 1,000 Units by mouth every morning.     diphenhydrAMINE 25 mg capsule  Commonly known as:  BENADRYL  Take 25 mg by mouth every 4 (four) hours as needed.     fish oil-omega-3 fatty acids 1000 MG capsule  Take 1 g by mouth every morning.     gabapentin 300 MG capsule  Commonly known as:  NEURONTIN  Take 1 capsule (300 mg total) by mouth 2 (two) times daily as needed.     lidocaine-prilocaine cream  Commonly  known as:  EMLA  Apply to port site one hour prior to use. Do not rub in. Cover with plastic.     loratadine 10 MG tablet  Commonly known as:  CLARITIN  Take 10 mg by mouth at bedtime.     ondansetron 4 MG disintegrating tablet  Commonly known as:  ZOFRAN ODT  Take 1 tablet (4 mg total) by mouth every 8 (eight) hours as needed for nausea or vomiting.     PEPCID AC PO  Take 1 tablet by mouth daily.     PRESCRIPTION MEDICATION  Chemo - [facility]     PROAIR HFA 108 (90 BASE) MCG/ACT inhaler  Generic drug:  albuterol  INHALE 2 PUFFS BY MOUTH FOUR TIMES DAILY AS NEEDED  FOR WHEEZING     prochlorperazine 5 MG tablet  Commonly known as:  COMPAZINE  Take 1 tablet (5 mg total) by mouth every 6 (six) hours as needed for nausea or vomiting.     SYMBICORT 160-4.5 MCG/ACT inhaler  Generic drug:  budesonide-formoterol  INHALE 2 PUFFS BY MOUTH TWICE DAILY     Tiotropium Bromide Monohydrate 2.5 MCG/ACT Aers  Commonly known as:  SPIRIVA RESPIMAT  Inhale 2 puffs into the lungs daily.     triamterene-hydrochlorothiazide 37.5-25 MG per tablet  Commonly known as:  MAXZIDE-25  Take 1 tablet by mouth every morning.     zaleplon 5 MG capsule  Commonly known as:  SONATA  Take 1 capsule (5 mg total) by mouth at bedtime as needed for sleep.         PHYSICAL EXAMINATION  Oncology Vitals 03/24/2015 03/12/2015 03/10/2015 02/10/2015 01/25/2015 01/25/2015 01/20/2015  Height 152 cm - 152 cm 152 cm - - 152 cm  Weight 81.421 kg - 80.241 kg 79.425 kg - - 78.472 kg  Weight (lbs) 179 lbs 8 oz - 176 lbs 14 oz 175 lbs 2 oz - - 173 lbs  BMI (kg/m2) 35.06 kg/m2 - 34.55 kg/m2 34.2 kg/m2 - - 33.79 kg/m2  Temp 97.9 98.4 97.7 98.2 - 97.7 97.7  Pulse 87 84 90 90 97 121 80  Resp 18 - 18 18 - - 18  SpO2 97 97 99 96 98 97 98  BSA (m2) 1.86 m2 - 1.84 m2 1.83 m2 - - 1.82 m2   BP Readings from Last 3 Encounters:  03/24/15 145/69  03/12/15 139/63  03/10/15 156/70    Physical Exam  Constitutional: She is oriented to person, place, and time and well-developed, well-nourished, and in no distress.  HENT:  Head: Normocephalic and atraumatic.  Eyes: Conjunctivae and EOM are normal. Pupils are equal, round, and reactive to light. Right eye exhibits no discharge. Left eye exhibits no discharge. No scleral icterus.  Neck: Normal range of motion.  Pulmonary/Chest: Effort normal. No respiratory distress.  Musculoskeletal: Normal range of motion. She exhibits no edema or tenderness.  Neurological: She is alert and oriented to person, place, and time. Gait normal.  Skin: Skin is warm and dry. No  rash noted. No erythema. No pallor.  Psychiatric: Affect normal.  Nursing note and vitals reviewed.   LABORATORY DATA:. Appointment on 03/24/2015  Component Date Value Ref Range Status  . WBC 03/24/2015 5.6  3.9 - 10.3 10e3/uL Final  . NEUT# 03/24/2015 3.9  1.5 - 6.5 10e3/uL Final  . HGB 03/24/2015 9.4* 11.6 - 15.9 g/dL Final  . HCT 03/24/2015 29.4* 34.8 - 46.6 % Final  . Platelets 03/24/2015 70* 145 - 400 10e3/uL Final  . MCV  03/24/2015 103.2* 79.5 - 101.0 fL Final  . MCH 03/24/2015 33.1  25.1 - 34.0 pg Final  . MCHC 03/24/2015 32.1  31.5 - 36.0 g/dL Final  . RBC 03/24/2015 2.85* 3.70 - 5.45 10e6/uL Final  . RDW 03/24/2015 19.3* 11.2 - 14.5 % Final  . lymph# 03/24/2015 1.2  0.9 - 3.3 10e3/uL Final  . MONO# 03/24/2015 0.5  0.1 - 0.9 10e3/uL Final  . Eosinophils Absolute 03/24/2015 0.0  0.0 - 0.5 10e3/uL Final  . Basophils Absolute 03/24/2015 0.0  0.0 - 0.1 10e3/uL Final  . NEUT% 03/24/2015 69.4  38.4 - 76.8 % Final  . LYMPH% 03/24/2015 20.7  14.0 - 49.7 % Final  . MONO% 03/24/2015 8.7  0.0 - 14.0 % Final  . EOS% 03/24/2015 0.6  0.0 - 7.0 % Final  . BASO% 03/24/2015 0.6  0.0 - 2.0 % Final     RADIOGRAPHIC STUDIES: No results found.  ASSESSMENT/PLAN:    Primary peritoneal carcinomatosis Patient received her final carboplatin/Taxol chemotherapy regimen on 03/10/2015.  She received Neulasta injection following her last chemotherapy as well.  Since patient has now completed her chemotherapy-patient will undergo close observation only.  Patient is complaining of some progressive neuropathy to the soles of her feet only.  She denies any other new symptoms whatsoever.  She denies any recent fevers or chills.  Blood counts obtained today reveal that the PIC 5.6, ANC 3.9, hemoglobin 9.4, and platelet count 70.  Patient denies any worsening issues with either easy bleeding or bruising.  Patient is scheduled to return on 04/22/2015 for labs, flushed, and visit.  Patient is to call in  the interim with any new or worsening symptoms whatsoever.  Chemotherapy-induced neuropathy Patient received her final carboplatin/Taxol chemotherapy regimen on 03/10/2015.  She received Neulasta injection following her last chemotherapy as well.  Since patient has now completed her chemotherapy-patient will undergo close observation only.  Patient is complaining of some progressive neuropathy to the soles of her feet only.  Patient states that it feels like she is walking on sand.  She describes the sensation as a numbness and tingling; with pain/discomfort only at night.  Will prescribe Neurontin 300 mg twice daily on an as-needed basis for neuropathy symptoms.      Patient stated understanding of all instructions; and was in agreement with this plan of care. The patient knows to call the clinic with any problems, questions or concerns.   Review/collaboration with Dr. Benay Spice regarding all aspects of patient's visit today.   Total time spent with patient was 25 minutes;  with greater than 75 percent of that time spent in face to face counseling regarding patient's symptoms,  and coordination of care and follow up.  Disclaimer:This dictation was prepared with Dragon/digital dictation along with [employer]. Any transcriptional errors that result from this process are unintentional.  Drue Second, NP 03/25/2015

## 2015-03-25 NOTE — Assessment & Plan Note (Signed)
Patient received her final carboplatin/Taxol chemotherapy regimen on 03/10/2015.  She received Neulasta injection following her last chemotherapy as well.  Since patient has now completed her chemotherapy-patient will undergo close observation only.  Patient is complaining of some progressive neuropathy to the soles of her feet only.  She denies any other new symptoms whatsoever.  She denies any recent fevers or chills.  Blood counts obtained today reveal that the PIC 5.6, ANC 3.9, hemoglobin 9.4, and platelet count 70.  Patient denies any worsening issues with either easy bleeding or bruising.  Patient is scheduled to return on 04/22/2015 for labs, flushed, and visit.  Patient is to call in the interim with any new or worsening symptoms whatsoever.

## 2015-03-26 ENCOUNTER — Telehealth: Payer: Self-pay | Admitting: *Deleted

## 2015-03-26 NOTE — Telephone Encounter (Signed)
Spoke with pt today for follow up call after symptom management visit on 03/24/15.  Pt stated it is too soon to tell whether Neurontin has helped with neuropathy.  Stated pt has been sleeping very well at night - not have to take pain meds at night.  However, pt could feel a little better in her feet when first waking up - less tingling.   Pt stated she would take Neurontin at night for about a week; then pt will try to take 1 tablet during day - to see how drowsiness affecting pt during day.  Pt understood to call office back if neuropathy worsened.  Retta Mac, NP symptom management notified.

## 2015-03-30 ENCOUNTER — Encounter: Payer: Self-pay | Admitting: Family Medicine

## 2015-03-30 ENCOUNTER — Ambulatory Visit (INDEPENDENT_AMBULATORY_CARE_PROVIDER_SITE_OTHER): Payer: Medicare Other | Admitting: Emergency Medicine

## 2015-03-30 ENCOUNTER — Encounter: Payer: Self-pay | Admitting: Emergency Medicine

## 2015-03-30 VITALS — BP 140/82 | HR 100 | Ht 60.0 in | Wt 179.0 lb

## 2015-03-30 DIAGNOSIS — Z1231 Encounter for screening mammogram for malignant neoplasm of breast: Secondary | ICD-10-CM | POA: Diagnosis not present

## 2015-03-30 DIAGNOSIS — J449 Chronic obstructive pulmonary disease, unspecified: Secondary | ICD-10-CM | POA: Diagnosis not present

## 2015-03-30 NOTE — Assessment & Plan Note (Signed)
Follow-up visit for her COPD. She has been struggling with her functional capacity and with neuropathy in the aftermath of chemotherapy. She states that her breathing has been doing fairly well. She would like to undertake a trial off of Spiriva. Based on her pulmonary function testing I suspect that she will miss it. We will stop it, continue Symbicort and assess her in one month.   Please continue your Symbicort twice a day.  We will do a trial of stopping your Spiriva to see if you miss it. Stay off for 2-3 weeks and then assess your breathing and your albuterol usage.  We will not give the flu shot or Prevnar-13 yet. You need to be off chemotherapy for over a month before taking these.  Follow with Dr Lamonte Sakai in 1 month

## 2015-03-30 NOTE — Progress Notes (Signed)
Subjective:    Patient ID: Lynn Ramirez, female    DOB: 03/09/1941, 74 y.o.   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 History is taken from the patient and from her husband. She completed chemo August, is following with Dr Benay Spice, Dr Denman George. She has been tired, is having some neuropathy. Her breathing is stable. She is on spiriva and symbicort. Uses SABA rarely. She is interested in doing a trial off of spiriva. I personally reviewed her pulmonary function testing from 05/06/2012. This showed severe obstructive lung disease without bronchodilator response.    Review of Systems As per HPI   Past Medical History  Diagnosis Date  . H/O hydronephrosis   . Hypertension   . Emphysema   . COPD (chronic obstructive pulmonary disease)   . Cancer     ovarian  . Eczema     hands  . GERD (gastroesophageal reflux disease)   . History of transfusion     age 74  . Difficulty sleeping      Family History  Problem Relation Age of Onset  . Diabetes Father   . Lung cancer Father 53    metastasis to liver  . Pancreatic cancer Mother 46  . Stroke Paternal Grandfather   . Colon cancer Neg Hx   . Stomach cancer Neg Hx   . Heart disease Maternal Aunt   . Diabetes Paternal Uncle   . Heart Problems Maternal Grandmother   . Heart Problems Maternal Grandfather   . Diabetes Paternal Grandmother   . Stroke Paternal Grandmother   . Heart Problems Paternal Uncle   . Cancer Cousin     unknown type  . Breast cancer Cousin     dx. 68s  . Leukemia Cousin     dx. 16-17     Social History   Social History  . Marital Status: Married    Spouse Name: N/A  . Number of Children: 3  . Years of Education: N/A   Occupational History  . RETIRED    Social History Main Topics  . Smoking status: Former Smoker -- 1.00 packs/day for 40 years    Types: Cigarettes    Quit date:  07/10/2013  . Smokeless tobacco: Never Used     Comment: Pt states that she is has struggled with smoking since Jan--1 cig here and there 04/09/14  . Alcohol Use: Yes     Comment: social   . Drug Use: No  . Sexual Activity: Not on file   Other Topics Concern  . Not on file   Social History Narrative     Allergies  Allergen Reactions  . Latex Rash  . Penicillins Other (See Comments)    welps     Outpatient Prescriptions Prior to Visit  Medication Sig Dispense Refill  . acetaminophen (TYLENOL) 325 MG tablet Take 650 mg by mouth every 6 (six) hours as needed (for pain).     Marland Kitchen antiseptic oral rinse (BIOTENE) LIQD 15 mLs by Mouth Rinse route 3 (three) times daily.    . Biotin 2500 MCG CAPS Take 1 tablet by mouth daily.    . Calcium Carb-Cholecalciferol (CALCIUM 600 + D PO) Take 1 tablet by mouth daily.    . cholecalciferol (VITAMIN D) 1000 UNITS tablet Take 1,000 Units by mouth every morning.     . Famotidine (PEPCID AC PO) Take 1 tablet by mouth daily.    Marland Kitchen  fish oil-omega-3 fatty acids 1000 MG capsule Take 1 g by mouth every morning.     . fluocinonide cream (LIDEX) 0.63 % Apply 1 application topically 2 (two) times daily.    Marland Kitchen lidocaine-prilocaine (EMLA) cream Apply to port site one hour prior to use. Do not rub in. Cover with plastic. 30 g 0  . loratadine (CLARITIN) 10 MG tablet Take 10 mg by mouth at bedtime.     Marland Kitchen PRESCRIPTION MEDICATION Chemo - [facility]    . PROAIR HFA 108 (90 BASE) MCG/ACT inhaler INHALE 2 PUFFS BY MOUTH FOUR TIMES DAILY AS NEEDED FOR WHEEZING 1 Inhaler 3  . prochlorperazine (COMPAZINE) 5 MG tablet Take 1 tablet (5 mg total) by mouth every 6 (six) hours as needed for nausea or vomiting. 20 tablet 1  . SYMBICORT 160-4.5 MCG/ACT inhaler INHALE 2 PUFFS BY MOUTH TWICE DAILY 10.2 g 6  . Tiotropium Bromide Monohydrate (SPIRIVA RESPIMAT) 2.5 MCG/ACT AERS Inhale 2 puffs into the lungs daily. 1 Inhaler 5  . triamterene-hydrochlorothiazide (MAXZIDE-25) 37.5-25 MG per tablet  Take 1 tablet by mouth every morning.     . gabapentin (NEURONTIN) 300 MG capsule Take 1 capsule (300 mg total) by mouth 2 (two) times daily as needed. 60 capsule 1  . ondansetron (ZOFRAN ODT) 4 MG disintegrating tablet Take 1 tablet (4 mg total) by mouth every 8 (eight) hours as needed for nausea or vomiting. (Patient not taking: Reported on 03/24/2015) 30 tablet 2  . zaleplon (SONATA) 5 MG capsule Take 1 capsule (5 mg total) by mouth at bedtime as needed for sleep. 20 capsule 1   No facility-administered medications prior to visit.         Objective:   Physical Exam Filed Vitals:   03/30/15 1135 03/30/15 1136  BP:  140/82  Pulse:  100  Height: 5' (1.524 m)   Weight: 179 lb (81.194 kg)   SpO2:  96%   Gen: Pleasant, well-nourished, in no distress,  normal affect  ENT: No lesions,  mouth clear,  oropharynx clear, no postnasal drip  Neck: No JVD, no TMG, no carotid bruits  Lungs: No use of accessory muscles, no dullness to percussion, clear without rales or rhonchi  Cardiovascular: RRR, heart sounds normal, no murmur or gallops, no peripheral edema  Musculoskeletal: No deformities, no cyanosis or clubbing  Neuro: alert, non focal  Skin: Warm, no lesions or rashes      Assessment & Plan:  No problem-specific assessment & plan notes found for this encounter.

## 2015-03-30 NOTE — Patient Instructions (Addendum)
Please continue your Symbicort twice a day.  We will do a trial of stopping your Spiriva to see if you miss it. Stay off for 2-3 weeks and then assess your breathing and your albuterol usage.  We will not give the flu shot or Prevnar-13 yet. You need to be off chemotherapy for over a month before taking these.  Follow with Dr Lamonte Sakai in 1 month

## 2015-03-31 ENCOUNTER — Encounter: Payer: Self-pay | Admitting: Cardiology

## 2015-03-31 ENCOUNTER — Ambulatory Visit (INDEPENDENT_AMBULATORY_CARE_PROVIDER_SITE_OTHER): Payer: Medicare Other | Admitting: Cardiology

## 2015-03-31 VITALS — BP 130/72 | HR 81 | Ht 60.0 in | Wt 177.0 lb

## 2015-03-31 DIAGNOSIS — Z8249 Family history of ischemic heart disease and other diseases of the circulatory system: Secondary | ICD-10-CM

## 2015-03-31 DIAGNOSIS — I25119 Atherosclerotic heart disease of native coronary artery with unspecified angina pectoris: Secondary | ICD-10-CM

## 2015-03-31 DIAGNOSIS — I2510 Atherosclerotic heart disease of native coronary artery without angina pectoris: Secondary | ICD-10-CM

## 2015-03-31 DIAGNOSIS — I739 Peripheral vascular disease, unspecified: Secondary | ICD-10-CM | POA: Diagnosis not present

## 2015-03-31 DIAGNOSIS — E785 Hyperlipidemia, unspecified: Secondary | ICD-10-CM

## 2015-03-31 DIAGNOSIS — I2584 Coronary atherosclerosis due to calcified coronary lesion: Secondary | ICD-10-CM

## 2015-03-31 DIAGNOSIS — R0609 Other forms of dyspnea: Secondary | ICD-10-CM

## 2015-03-31 DIAGNOSIS — R0600 Dyspnea, unspecified: Secondary | ICD-10-CM

## 2015-03-31 MED ORDER — ATORVASTATIN CALCIUM 10 MG PO TABS: 10.0000 mg | ORAL_TABLET | Freq: Every day | ORAL | Status: DC

## 2015-03-31 NOTE — Progress Notes (Signed)
Patient ID: Lynn Ramirez, female   DOB: 09/14/1940, 74 y.o.   MRN: [id number]      Cardiology Office Note  Date:  03/31/2015   ID:  Lynn Ramirez, DOB [birth year], MRN [id number]  PCP:  Antony Blackbird, MD  Cardiologist:  Dorothy Spark, MD   Chief Complaint  Patient presents with  . New Evaluation    artery cacification    History of Present Illness: Lynn Ramirez is a 74 y.o. female who presents for evaluation of DOE and abnormal chest CT. The patient is being treated for ovarian cancer with metastasis, currently in remission. She has h/o COPD, hyperlipidemia and hypertension.  She underwent chest CT for evaluation of metastasis and as found to have coronary calcifications.  She denies LE edema, orthopnea, PND, syncope.  She complains of RLE pain mostly on exertion but also at rest.  She has FH of premature CAD.  Past Medical History  Diagnosis Date  . H/O hydronephrosis   . Hypertension   . Emphysema   . COPD (chronic obstructive pulmonary disease)   . Cancer     ovarian  . Eczema     hands  . GERD (gastroesophageal reflux disease)   . History of transfusion     age 42  . Difficulty sleeping     Past Surgical History  Procedure Laterality Date  . Nephrectomy    . Tonsilectomy, adenoidectomy, bilateral myringotomy and tubes    . Tubal ligation    . Hand surgery Right 1980's  . Thoracentesis      several  . Robotic assisted total hysterectomy with bilateral salpingo oopherectomy Bilateral 12/29/2014    Procedure: ROBOTIC ASSISTED TOTAL LAPAROSCOPIC HYSTERECTOMY WITH BILATERAL SALPINGO OOPHORECTOMY AND OOMENTECTOMY WITH RADICAL TUMOR [place] ;  Surgeon: Everitt Amber, MD;  Location: [facility];  Service: Gynecology;  Laterality: Bilateral;  . Laparotomy N/A 12/29/2014    Procedure:  LAPAROTOMY;  Surgeon: Everitt Amber, MD;  Location: [facility];  Service: Gynecology;  Laterality: N/A;     Current Outpatient Prescriptions  Medication Sig Dispense Refill  .  acetaminophen (TYLENOL) 325 MG tablet Take 650 mg by mouth every 6 (six) hours as needed (for pain).     Marland Kitchen antiseptic oral rinse (BIOTENE) LIQD 15 mLs by Mouth Rinse route 3 (three) times daily.    . Biotin 2500 MCG CAPS Take 1 tablet by mouth daily.    . Calcium Carb-Cholecalciferol (CALCIUM 600 + D PO) Take 1 tablet by mouth daily.    . cholecalciferol (VITAMIN D) 1000 UNITS tablet Take 1,000 Units by mouth every morning.     . Famotidine (PEPCID AC PO) Take 1 tablet by mouth daily.    . fish oil-omega-3 fatty acids 1000 MG capsule Take 1 g by mouth every morning.     . fluocinonide cream (LIDEX) 1.02 % Apply 1 application topically 2 (two) times daily.    Marland Kitchen gabapentin (NEURONTIN) 300 MG capsule Take 300 mg by mouth at bedtime.    . lidocaine-prilocaine (EMLA) cream Apply to port site one hour prior to use. Do not rub in. Cover with plastic. 30 g 0  . loratadine (CLARITIN) 10 MG tablet Take 10 mg by mouth at bedtime.     Marland Kitchen PRESCRIPTION MEDICATION Chemo - [facility]    . PROAIR HFA 108 (90 BASE) MCG/ACT inhaler INHALE 2 PUFFS BY MOUTH FOUR TIMES DAILY AS NEEDED FOR WHEEZING 1 Inhaler 3  . prochlorperazine (COMPAZINE) 5 MG tablet Take 1 tablet (5 mg total) by mouth  every 6 (six) hours as needed for nausea or vomiting. 20 tablet 1  . SYMBICORT 160-4.5 MCG/ACT inhaler INHALE 2 PUFFS BY MOUTH TWICE DAILY 10.2 g 6  . Tiotropium Bromide Monohydrate (SPIRIVA RESPIMAT) 2.5 MCG/ACT AERS Inhale 2 puffs into the lungs daily. 1 Inhaler 5  . triamterene-hydrochlorothiazide (MAXZIDE-25) 37.5-25 MG per tablet Take 1 tablet by mouth every morning.      No current facility-administered medications for this visit.    Allergies:   Latex and Penicillins    Social History:  The patient  reports that she quit smoking about 20 months ago. Her smoking use included Cigarettes. She has a 40 pack-year smoking history. She has never used smokeless tobacco. She reports that she drinks alcohol. She reports that she does not  use illicit drugs.   Family History:  The patient's family history includes Breast cancer in her cousin; Cancer in her cousin; Diabetes in her father, paternal grandmother, and paternal uncle; Heart Problems in her maternal grandfather, maternal grandmother, and paternal uncle; Heart disease in her maternal aunt; Leukemia in her cousin; Lung cancer (age of onset: 21) in her father; Pancreatic cancer (age of onset: 64) in her mother; Stroke in her paternal grandfather and paternal grandmother. There is no history of Colon cancer or Stomach cancer.    ROS:  Please see the history of present illness.   Otherwise, review of systems are positive for none.   All other systems are reviewed and negative.    PHYSICAL EXAM: VS:  BP 130/72 mmHg  Pulse 81  Ht 5' (1.524 m)  Wt 177 lb (80.287 kg)  BMI 34.57 kg/m2  SpO2 96% , BMI Body mass index is 34.57 kg/(m^2). GEN: Well nourished, well developed, in no acute distress HEENT: normal Neck: no JVD, carotid bruits, or masses Cardiac: RRR; no murmurs, rubs, or gallops,no edema  Respiratory:  clear to auscultation bilaterally, normal work of breathing GI: soft, nontender, nondistended, + BS MS: no deformity or atrophy Skin: warm and dry, no rash Neuro:  Strength and sensation are intact Psych: euthymic mood, full affect  EKG:  EKG is ordered today. The ekg ordered today demonstrates SR, normal ECG   Recent Labs: 03/10/2015: ALT 14; BUN 24.1; Creatinine 1.0; Potassium 4.0; Sodium 142 03/24/2015: HGB 9.4*; Platelets 70*    Lipid Panel No results found for: CHOL, TRIG, HDL, CHOLHDL, VLDL, LDLCALC, LDLDIRECT    Wt Readings from Last 3 Encounters:  03/31/15 177 lb (80.287 kg)  03/30/15 179 lb (81.194 kg)  03/24/15 179 lb 8 oz (81.421 kg)         ASSESSMENT AND PLAN:  1.  Abnormal chest CT with coronary calcifications and DOE - we will schedule a Lexiscan nuclear stress test (unable to walk on a treadmill) to assess for significant  ischemia.  We will start atorvastatin 10 mg po daily for evidence of CAD.  2. Claudications - check LE arterial Duplex  3. HLP - as above, check CMP in 1 month.  Follow up in 6 months  Signed, Dorothy Spark, MD  03/31/2015 9:30 AM    [facility] [address], [place], [place]  [address] Phone: [phone]; Fax: [phone]

## 2015-03-31 NOTE — Patient Instructions (Addendum)
Medication Instructions:   Your physician has recommended you make the following change in your medication:   START TAKING LIPITOR 10 MG      Your physician recommends that you return for lab work in: [place] [facility]     Testing/Procedures:  Your physician has requested that you have a lexiscan myoview. For further information please visit [email or web address]. Please follow instruction sheet, as given.    Your physician has requested that you have a lower  extremity arterial duplex. This test is an ultrasound of the arteries in the legs or arms. It looks at arterial blood flow in the legs and arms. Allow one hour for Lower and Upper Arterial scans. There are no restrictions or special instructions       Follow-Up:  6 MONTHS WITH DR Meda Coffee

## 2015-04-01 ENCOUNTER — Telehealth: Payer: Self-pay | Admitting: *Deleted

## 2015-04-01 NOTE — Telephone Encounter (Signed)
no

## 2015-04-01 NOTE — Telephone Encounter (Signed)
COULD CHEMOTHERAPY OR CANCER EFFECT VITAMIN B12 LEVELS?

## 2015-04-02 ENCOUNTER — Encounter: Payer: Self-pay | Admitting: Cardiology

## 2015-04-02 NOTE — Telephone Encounter (Signed)
Per Dr. Benay Spice; notified pt that MD said "no" chemo would not make your B12 level increase.  Pt states "well my PCP just doesn't know why its so high so I will tell her what Dr. Benay Spice said"  Pt verbalized understanding to call if questions/problems.

## 2015-04-15 ENCOUNTER — Telehealth (HOSPITAL_COMMUNITY): Payer: Self-pay | Admitting: *Deleted

## 2015-04-15 NOTE — Telephone Encounter (Signed)
Patient given detailed instructions per Myocardial Perfusion Study Information Sheet for test on 04/20/15 at 945 am. Patient notified to arrive 15 minutes early and that it is imperative to arrive on time for appointment to keep from having the test rescheduled.  If you need to cancel or reschedule your appointment, please call the office within 24 hours of your appointment. Failure to do so may result in a cancellation of your appointment, and a $50 no show fee. Patient verbalized understanding. Hubbard Robinson, RN

## 2015-04-15 NOTE — Telephone Encounter (Signed)
Left message on voicemail in reference to upcoming appointment scheduled for 04/20/15. Phone number given for a call back so details instructions can be given. Legna Mausolf J Wyndi Northrup, RN 

## 2015-04-20 ENCOUNTER — Ambulatory Visit (HOSPITAL_COMMUNITY): Payer: Medicare Other | Attending: Cardiology

## 2015-04-20 DIAGNOSIS — R0609 Other forms of dyspnea: Secondary | ICD-10-CM | POA: Diagnosis not present

## 2015-04-20 DIAGNOSIS — I10 Essential (primary) hypertension: Secondary | ICD-10-CM | POA: Diagnosis not present

## 2015-04-20 DIAGNOSIS — I25119 Atherosclerotic heart disease of native coronary artery with unspecified angina pectoris: Secondary | ICD-10-CM | POA: Diagnosis not present

## 2015-04-20 DIAGNOSIS — R002 Palpitations: Secondary | ICD-10-CM | POA: Insufficient documentation

## 2015-04-20 DIAGNOSIS — E785 Hyperlipidemia, unspecified: Secondary | ICD-10-CM

## 2015-04-20 LAB — MYOCARDIAL PERFUSION IMAGING
LV dias vol: 77 mL
LV sys vol: 28 mL
Peak HR: 107 {beats}/min
RATE: 0.15
Rest HR: 78 {beats}/min
SDS: 1
SRS: 3
SSS: 4
TID: 1.02

## 2015-04-20 MED ORDER — TECHNETIUM TC 99M SESTAMIBI GENERIC - CARDIOLITE
10.5000 | Freq: Once | INTRAVENOUS | Status: AC | PRN
  Administered 2015-04-20: 11 via INTRAVENOUS

## 2015-04-20 MED ORDER — TECHNETIUM TC 99M SESTAMIBI GENERIC - CARDIOLITE
33.0000 | Freq: Once | INTRAVENOUS | Status: AC | PRN
  Administered 2015-04-20: 33 via INTRAVENOUS

## 2015-04-20 MED ORDER — REGADENOSON 0.4 MG/5ML IV SOLN
0.4000 mg | Freq: Once | INTRAVENOUS | Status: AC
  Administered 2015-04-20: 0.4 mg via INTRAVENOUS

## 2015-04-22 ENCOUNTER — Telehealth: Payer: Self-pay | Admitting: Oncology

## 2015-04-22 ENCOUNTER — Other Ambulatory Visit (HOSPITAL_BASED_OUTPATIENT_CLINIC_OR_DEPARTMENT_OTHER): Payer: Medicare Other

## 2015-04-22 ENCOUNTER — Ambulatory Visit (HOSPITAL_BASED_OUTPATIENT_CLINIC_OR_DEPARTMENT_OTHER): Payer: Medicare Other

## 2015-04-22 ENCOUNTER — Ambulatory Visit (HOSPITAL_BASED_OUTPATIENT_CLINIC_OR_DEPARTMENT_OTHER): Payer: Medicare Other | Admitting: Nurse Practitioner

## 2015-04-22 VITALS — BP 116/79 | HR 84 | Temp 98.2°F | Resp 18 | Ht 60.0 in | Wt 178.6 lb

## 2015-04-22 DIAGNOSIS — C7962 Secondary malignant neoplasm of left ovary: Secondary | ICD-10-CM

## 2015-04-22 DIAGNOSIS — C5701 Malignant neoplasm of right fallopian tube: Secondary | ICD-10-CM | POA: Diagnosis not present

## 2015-04-22 DIAGNOSIS — C5700 Malignant neoplasm of unspecified fallopian tube: Secondary | ICD-10-CM

## 2015-04-22 DIAGNOSIS — J910 Malignant pleural effusion: Secondary | ICD-10-CM | POA: Diagnosis not present

## 2015-04-22 DIAGNOSIS — D6959 Other secondary thrombocytopenia: Secondary | ICD-10-CM | POA: Diagnosis not present

## 2015-04-22 DIAGNOSIS — Z452 Encounter for adjustment and management of vascular access device: Secondary | ICD-10-CM | POA: Diagnosis not present

## 2015-04-22 DIAGNOSIS — G620 Drug-induced polyneuropathy: Secondary | ICD-10-CM

## 2015-04-22 DIAGNOSIS — C7961 Secondary malignant neoplasm of right ovary: Secondary | ICD-10-CM

## 2015-04-22 DIAGNOSIS — C569 Malignant neoplasm of unspecified ovary: Secondary | ICD-10-CM | POA: Diagnosis not present

## 2015-04-22 DIAGNOSIS — Z95828 Presence of other vascular implants and grafts: Secondary | ICD-10-CM

## 2015-04-22 LAB — CBC WITH DIFFERENTIAL/PLATELET
BASO%: 0.4 % (ref 0.0–2.0)
BASOS ABS: 0 10*3/uL (ref 0.0–0.1)
EOS ABS: 0.1 10*3/uL (ref 0.0–0.5)
EOS%: 2.5 % (ref 0.0–7.0)
HCT: 34.4 % — ABNORMAL LOW (ref 34.8–46.6)
HEMOGLOBIN: 10.5 g/dL — AB (ref 11.6–15.9)
LYMPH%: 16.3 % (ref 14.0–49.7)
MCH: 32 pg (ref 25.1–34.0)
MCHC: 30.5 g/dL — ABNORMAL LOW (ref 31.5–36.0)
MCV: 104.9 fL — AB (ref 79.5–101.0)
MONO#: 0.5 10*3/uL (ref 0.1–0.9)
MONO%: 10 % (ref 0.0–14.0)
NEUT#: 3.7 10*3/uL (ref 1.5–6.5)
NEUT%: 70.8 % (ref 38.4–76.8)
PLATELETS: 215 10*3/uL (ref 145–400)
RBC: 3.28 10*6/uL — ABNORMAL LOW (ref 3.70–5.45)
RDW: 17.1 % — AB (ref 11.2–14.5)
WBC: 5.2 10*3/uL (ref 3.9–10.3)
lymph#: 0.9 10*3/uL (ref 0.9–3.3)

## 2015-04-22 MED ORDER — SODIUM CHLORIDE 0.9 % IJ SOLN
10.0000 mL | INTRAMUSCULAR | Status: DC | PRN
  Administered 2015-04-22: 10 mL via INTRAVENOUS
  Filled 2015-04-22: qty 10

## 2015-04-22 MED ORDER — HEPARIN SOD (PORK) LOCK FLUSH 100 UNIT/ML IV SOLN
500.0000 [IU] | Freq: Once | INTRAVENOUS | Status: AC
  Administered 2015-04-22: 500 [IU] via INTRAVENOUS
  Filled 2015-04-22: qty 5

## 2015-04-22 NOTE — Progress Notes (Signed)
[facility] OFFICE PROGRESS NOTE   Diagnosis:  Ovarian cancer  INTERVAL HISTORY:   Lynn Ramirez returns as scheduled. She completed cycle 6 Taxol/carboplatin 03/10/2015. She feels she is slowly regaining strength. Neuropathy symptoms are better. She notes less numbness and pain in the feet. She takes Neurontin mainly at bedtime. She tries to avoid taking it during the day due to sedation. She denies abdominal pain. No nausea or vomiting. No constipation or diarrhea. No shortness of breath.  Objective:  Vital signs in last 24 hours:  Blood pressure 116/79, pulse 84, temperature 98.2 F (36.8 C), temperature source Oral, resp. rate 18, height 5' (1.524 m), weight 178 lb 9.6 oz (81.012 kg), SpO2 99 %.   HEENT: No thrush or ulcers. Resp: Distant breath sounds. Good air movement bilaterally. No respiratory distress. Cardio: Regular rate and rhythm. GI: Abdomen soft. Mild tenderness right upper abdomen. No hepatomegaly. No mass. Vascular: No leg edema. Port-A-Cath without erythema.   Lab Results:  Lab Results  Component Value Date   WBC 5.2 04/22/2015   HGB 10.5* 04/22/2015   HCT 34.4* 04/22/2015   MCV 104.9* 04/22/2015   PLT 215 04/22/2015   NEUTROABS 3.7 04/22/2015    Imaging:  No results found.  Medications: I have reviewed the patient's current medications.  Assessment/Plan:  1. 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, immunohistochemical profile consistent with a GYN primary, elevated CA 125  Staging CTs of the chest, abdomen, and pelvis on 10/06/2014 revealed a loculated right pleural effusion, ascites, and omental/mesenteric thickening  Cytology from peritoneal fluid 10/13/2014 revealed malignant cells consistent with metastatic adenocarcinoma  Biopsy of an omental mass on 11/02/2014 revealed invasive serous carcinoma with psammoma bodies  Cycle 1 Taxol/carboplatin 10/28/2014  Cycle 2  Taxol/carboplatin 11/18/2014  Cycle 3 Taxol/carboplatin 12/09/2014  CT scan 12/23/2014 with interval improvement in peritoneal carcinomatosis with near-complete resolution of ascites and decreased omental nodularity. Significant improvement in malignant right pleural effusion.  Status post robotic-assisted laparoscopic hysterectomy with bilateral salpingoophorectomy, omentectomy, radical tumor debulking 12/29/2014. Per Dr. Serita Grit office note 01/14/2015 cytoreduction was optimal with residual disease remaining only in the 1 mm implants on the small intestine. 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  Cycle 1 adjuvant Taxol/carboplatin 01/20/2015  Cycle 2 adjuvant Taxol/carboplatin 02/10/2015  Cycle 3 adjuvant Taxol/carboplatin 03/10/2015 2. COPD 3. Dyspnea secondary to COPD and the large right pleural effusion, status post therapeutic thoracentesis procedures 09/30/2014,10/09/2014, and 10/21/2014. Left thoracentesis 10/30/2014 4. Left nephrectomy as a child 5. Delayed nausea following cycle 1 Taxol/carboplatin, Aloxi/Emend added with cycle 2 with improvement. 6. Right lower extremity edema, right calf pain 12/09/2014. Negative venous Doppler 12/09/2014. 7. Diffuse pruritus following cycle 1 adjuvant Taxol/carboplatin 01/20/2015, no rash, resolved with a steroid dose pack 8. Thrombocytopenia second to chemotherapy, the carboplatin was dose reduced with cycle 2 adjuvant Taxol/carboplatin 02/10/2015 9. Chemotherapy-induced peripheral neuropathy, mild; improved 04/22/2015.  Disposition: Lynn Ramirez appears stable. She continues to recover from chemotherapy. The peripheral neuropathy is better. She will continue  Neurontin at bedtime.  We will follow-up on the CA-125 from today.  She will return for a follow-up visit, CA-125, Port-A-Cath flush in 6 weeks. She will contact the office in the interim with any problems. We specifically discussed shortness of breath, abdominal pain, nausea/vomiting,  constipation. She expressed her understanding.  Plan reviewed with Dr. Benay Spice.    Ned Card ANP/GNP-BC   04/22/2015  10:45 AM

## 2015-04-22 NOTE — Telephone Encounter (Signed)
Gave adn printd papt sched and avs for pt for NOV

## 2015-04-23 ENCOUNTER — Telehealth: Payer: Self-pay | Admitting: *Deleted

## 2015-04-23 ENCOUNTER — Ambulatory Visit (HOSPITAL_BASED_OUTPATIENT_CLINIC_OR_DEPARTMENT_OTHER)
Admission: RE | Admit: 2015-04-23 | Discharge: 2015-04-23 | Disposition: A | Discharge status: Home / Self Care | Payer: No Typology Code available for payment source | Source: Ambulatory Visit | Attending: Cardiology | Admitting: Cardiology

## 2015-04-23 ENCOUNTER — Emergency Department (HOSPITAL_COMMUNITY): Payer: No Typology Code available for payment source

## 2015-04-23 ENCOUNTER — Encounter (HOSPITAL_COMMUNITY): Payer: Self-pay | Admitting: Emergency Medicine

## 2015-04-23 ENCOUNTER — Emergency Department (HOSPITAL_COMMUNITY)
Admission: EM | Admit: 2015-04-23 | Discharge: 2015-04-23 | Disposition: A | Discharge status: Home / Self Care | Payer: No Typology Code available for payment source | Attending: Emergency Medicine | Admitting: Emergency Medicine

## 2015-04-23 DIAGNOSIS — S0990XA Unspecified injury of head, initial encounter: Secondary | ICD-10-CM

## 2015-04-23 DIAGNOSIS — Z8543 Personal history of malignant neoplasm of ovary: Secondary | ICD-10-CM | POA: Insufficient documentation

## 2015-04-23 DIAGNOSIS — S0183XA Puncture wound without foreign body of other part of head, initial encounter: Secondary | ICD-10-CM | POA: Insufficient documentation

## 2015-04-23 DIAGNOSIS — Z87448 Personal history of other diseases of urinary system: Secondary | ICD-10-CM | POA: Insufficient documentation

## 2015-04-23 DIAGNOSIS — Z87891 Personal history of nicotine dependence: Secondary | ICD-10-CM | POA: Diagnosis not present

## 2015-04-23 DIAGNOSIS — S199XXA Unspecified injury of neck, initial encounter: Secondary | ICD-10-CM | POA: Diagnosis not present

## 2015-04-23 DIAGNOSIS — Y9301 Activity, walking, marching and hiking: Secondary | ICD-10-CM | POA: Insufficient documentation

## 2015-04-23 DIAGNOSIS — Z872 Personal history of diseases of the skin and subcutaneous tissue: Secondary | ICD-10-CM | POA: Diagnosis not present

## 2015-04-23 DIAGNOSIS — Z880 Allergy status to penicillin: Secondary | ICD-10-CM | POA: Insufficient documentation

## 2015-04-23 DIAGNOSIS — Z79899 Other long term (current) drug therapy: Secondary | ICD-10-CM | POA: Diagnosis not present

## 2015-04-23 DIAGNOSIS — J449 Chronic obstructive pulmonary disease, unspecified: Secondary | ICD-10-CM | POA: Diagnosis not present

## 2015-04-23 DIAGNOSIS — I2510 Atherosclerotic heart disease of native coronary artery without angina pectoris: Secondary | ICD-10-CM | POA: Insufficient documentation

## 2015-04-23 DIAGNOSIS — Y92511 Restaurant or cafe as the place of occurrence of the external cause: Secondary | ICD-10-CM | POA: Insufficient documentation

## 2015-04-23 DIAGNOSIS — I10 Essential (primary) hypertension: Secondary | ICD-10-CM | POA: Insufficient documentation

## 2015-04-23 DIAGNOSIS — S098XXA Other specified injuries of head, initial encounter: Secondary | ICD-10-CM | POA: Diagnosis not present

## 2015-04-23 DIAGNOSIS — Z91040 Latex allergy status: Secondary | ICD-10-CM | POA: Insufficient documentation

## 2015-04-23 DIAGNOSIS — I739 Peripheral vascular disease, unspecified: Secondary | ICD-10-CM

## 2015-04-23 DIAGNOSIS — Z7951 Long term (current) use of inhaled steroids: Secondary | ICD-10-CM | POA: Insufficient documentation

## 2015-04-23 DIAGNOSIS — Y998 Other external cause status: Secondary | ICD-10-CM | POA: Diagnosis not present

## 2015-04-23 DIAGNOSIS — W01198A Fall on same level from slipping, tripping and stumbling with subsequent striking against other object, initial encounter: Secondary | ICD-10-CM | POA: Diagnosis not present

## 2015-04-23 DIAGNOSIS — E785 Hyperlipidemia, unspecified: Secondary | ICD-10-CM

## 2015-04-23 DIAGNOSIS — S01131A Puncture wound without foreign body of right eyelid and periocular area, initial encounter: Secondary | ICD-10-CM | POA: Diagnosis not present

## 2015-04-23 LAB — CA 125: CA 125: 42 U/mL — AB (ref <35)

## 2015-04-23 MED ORDER — ACETAMINOPHEN 325 MG PO TABS: 650.0000 mg | ORAL_TABLET | Freq: Once | ORAL | Status: DC

## 2015-04-23 MED ORDER — ONDANSETRON HCL 4 MG/2ML IJ SOLN: 4.0000 mg | Freq: Once | INTRAMUSCULAR | Status: DC

## 2015-04-23 NOTE — ED Notes (Signed)
PA at bedside.

## 2015-04-23 NOTE — Telephone Encounter (Signed)
-----   Message from Ladell Pier, MD sent at 04/23/2015  7:34 AM EDT ----- Please call patient, ca125 is stable, f/u as scheduled

## 2015-04-23 NOTE — ED Provider Notes (Signed)
CSN: [id number]     Arrival date & time 04/23/15  1957 History   First MD Initiated Contact with Patient 04/23/15 2011     Chief Complaint  Patient presents with  . Head Injury    s/p fall     (Consider location/radiation/quality/duration/timing/severity/associated sxs/prior Treatment) HPI Patient presents to the emergency department with injuries from a fall that occurred earlier this evening.  The patient states she was walking out of the restaurant when she tripped over some curbing and hit the right side of her head just above her eyebrow on the right.  The patient states she did not lose consciousness.  No nausea, vomiting, blurred vision, weakness, dizziness, numbness, neck pain, back pain, hip pain or syncope.  The patient states that she did not take any medications prior to arrival.  Nothing seems make her condition better or worse.  Complaining of pain around the area of bruising just above the right eye Past Medical History  Diagnosis Date  . H/O hydronephrosis   . Hypertension   . Emphysema   . COPD (chronic obstructive pulmonary disease) ([place])   . Cancer ([place])     ovarian  . Eczema     hands  . GERD (gastroesophageal reflux disease)   . History of transfusion     age 68  . Difficulty sleeping    Past Surgical History  Procedure Laterality Date  . Nephrectomy    . Tonsilectomy, adenoidectomy, bilateral myringotomy and tubes    . Tubal ligation    . Hand surgery Right 1980's  . Thoracentesis      several  . Robotic assisted total hysterectomy with bilateral salpingo oopherectomy Bilateral 12/29/2014    Procedure: ROBOTIC ASSISTED TOTAL LAPAROSCOPIC HYSTERECTOMY WITH BILATERAL SALPINGO OOPHORECTOMY AND OOMENTECTOMY WITH RADICAL TUMOR [place] ;  Surgeon: Everitt Amber, MD;  Location: [facility];  Service: Gynecology;  Laterality: Bilateral;  . Laparotomy N/A 12/29/2014    Procedure:  LAPAROTOMY;  Surgeon: Everitt Amber, MD;  Location: [facility];  Service: Gynecology;  Laterality:  N/A;   Family History  Problem Relation Age of Onset  . Diabetes Father   . Lung cancer Father 39    metastasis to liver  . Pancreatic cancer Mother 28  . Stroke Paternal Grandfather   . Colon cancer Neg Hx   . Stomach cancer Neg Hx   . Heart disease Maternal Aunt   . Diabetes Paternal Uncle   . Heart Problems Maternal Grandmother   . Heart Problems Maternal Grandfather   . Diabetes Paternal Grandmother   . Stroke Paternal Grandmother   . Heart Problems Paternal Uncle   . Cancer Cousin     unknown type  . Breast cancer Cousin     dx. 75s  . Leukemia Cousin     dx. 16-17   Social History  Substance Use Topics  . Smoking status: Former Smoker -- 1.00 packs/day for 40 years    Types: Cigarettes    Quit date: 07/10/2013  . Smokeless tobacco: Never Used  . Alcohol Use: 0.0 oz/week    0 Standard drinks or equivalent per week     Comment: social    OB History    No data available     Review of Systems All other systems negative except as documented in the HPI. All pertinent positives and negatives as reviewed in the HPI.   Allergies  Latex and Penicillins  Home Medications   Prior to Admission medications   Medication Sig Start Date End  Date Taking? Authorizing Provider  acetaminophen (TYLENOL) 325 MG tablet Take 650 mg by mouth every 6 (six) hours as needed (for pain).    Yes Historical Provider, MD  antiseptic oral rinse (BIOTENE) LIQD 15 mLs by Mouth Rinse route 3 (three) times daily.   Yes Historical Provider, MD  atorvastatin (LIPITOR) 10 MG tablet Take 1 tablet (10 mg total) by mouth daily. 03/31/15  Yes Dorothy Spark, MD  Biotin 2500 MCG CAPS Take 1 tablet by mouth daily.   Yes Historical Provider, MD  Calcium Carb-Cholecalciferol (CALCIUM 600 + D PO) Take 1 tablet by mouth daily.   Yes Historical Provider, MD  cholecalciferol (VITAMIN D) 1000 UNITS tablet Take 1,000 Units by mouth every morning.    Yes Historical Provider, MD  Famotidine (PEPCID AC PO) Take  1 tablet by mouth every morning.    Yes Historical Provider, MD  fish oil-omega-3 fatty acids 1000 MG capsule Take 1 g by mouth every morning.    Yes Historical Provider, MD  fluocinonide cream (LIDEX) 5.85 % Apply 1 application topically 2 (two) times daily as needed (For ezcema).    Yes Historical Provider, MD  gabapentin (NEURONTIN) 300 MG capsule Take 300 mg by mouth at bedtime.   Yes Historical Provider, MD  lidocaine-prilocaine (EMLA) cream Apply to port site one hour prior to use. Do not rub in. Cover with plastic. 10/28/14  Yes Ladell Pier, MD  loratadine (CLARITIN) 10 MG tablet Take 10 mg by mouth daily.    Yes Historical Provider, MD  [place]   Yes Historical Provider, MD  PROAIR HFA 108 (90 BASE) MCG/ACT inhaler INHALE 2 PUFFS BY MOUTH FOUR TIMES DAILY AS NEEDED FOR WHEEZING 07/08/14  Yes Kathee Delton, MD  SYMBICORT 160-4.5 MCG/ACT inhaler INHALE 2 PUFFS BY MOUTH TWICE DAILY 05/13/14  Yes Kathee Delton, MD  triamterene-hydrochlorothiazide (MAXZIDE-25) 37.5-25 MG per tablet Take 1 tablet by mouth every morning.    Yes Historical Provider, MD  prochlorperazine (COMPAZINE) 5 MG tablet Take 1 tablet (5 mg total) by mouth every 6 (six) hours as needed for nausea or vomiting. Patient not taking: Reported on 04/23/2015 10/26/14   Ladell Pier, MD  Tiotropium Bromide Monohydrate (SPIRIVA RESPIMAT) 2.5 MCG/ACT AERS Inhale 2 puffs into the lungs daily. 01/18/15   Collene Gobble, MD   BP 149/81 mmHg  Pulse 97  Resp 18  Ht 5' (1.524 m)  Wt 177 lb (80.287 kg)  BMI 34.57 kg/m2  SpO2 96% Physical Exam  Constitutional: She is oriented to person, place, and time. She appears well-developed and well-nourished. No distress.  HENT:  Head: Normocephalic.    Right Ear: No hemotympanum.  Left Ear: No hemotympanum.  Mouth/Throat: Oropharynx is clear and moist.  Eyes: EOM are normal. Pupils are equal, round, and reactive to light.  Neck: Normal range of motion.  Neck supple.  Cardiovascular: Normal rate, regular rhythm and normal heart sounds.   Pulmonary/Chest: Effort normal and breath sounds normal.  Musculoskeletal:       Right hip: Normal.       Left hip: Normal.       Cervical back: Normal.       Thoracic back: Normal.       Lumbar back: Normal.  Neurological: She is alert and oriented to person, place, and time. She exhibits normal muscle tone.  Skin: Skin is warm and dry.  Nursing note and vitals reviewed.   ED Course  Procedures (including  critical care time) Labs Review Labs Reviewed - No data to display  Imaging Review Ct Head Wo Contrast  04/23/2015  CLINICAL DATA:  Fall, head injury EXAM: CT HEAD WITHOUT CONTRAST CT CERVICAL SPINE WITHOUT CONTRAST TECHNIQUE: Multidetector CT imaging of the head and cervical spine was performed following the standard protocol without intravenous contrast. Multiplanar CT image reconstructions of the cervical spine were also generated. COMPARISON:  None. FINDINGS: CT HEAD FINDINGS No skull fracture is noted. Paranasal sinuses and mastoid air cells are unremarkable. No intracranial hemorrhage, mass effect or midline shift. No acute cortical infarction. No mass lesion is noted on this unenhanced scan. CT CERVICAL SPINE FINDINGS Sagittal images of the spine shows degenerative changes C1-C2 articulation. There is disc space flattening with mild anterior and mild posterior spurring at C5-C6, C6-C7 level. Disc space flattening with mild anterior spurring at C7-T1 level. Axial images shows no acute fracture or subluxation. No prevertebral soft tissue swelling. Cervical airway is patent. There is no pneumothorax in visualized lung apices. IMPRESSION: 1. No acute intracranial abnormality. 2. No cervical spine acute fracture or subluxation. Degenerative changes as described above. Electronically Signed   By: Lahoma Crocker M.D.   On: 04/23/2015 21:12   Ct Cervical Spine Wo Contrast  04/23/2015  CLINICAL DATA:  Fall,  head injury EXAM: CT HEAD WITHOUT CONTRAST CT CERVICAL SPINE WITHOUT CONTRAST TECHNIQUE: Multidetector CT imaging of the head and cervical spine was performed following the standard protocol without intravenous contrast. Multiplanar CT image reconstructions of the cervical spine were also generated. COMPARISON:  None. FINDINGS: CT HEAD FINDINGS No skull fracture is noted. Paranasal sinuses and mastoid air cells are unremarkable. No intracranial hemorrhage, mass effect or midline shift. No acute cortical infarction. No mass lesion is noted on this unenhanced scan. CT CERVICAL SPINE FINDINGS Sagittal images of the spine shows degenerative changes C1-C2 articulation. There is disc space flattening with mild anterior and mild posterior spurring at C5-C6, C6-C7 level. Disc space flattening with mild anterior spurring at C7-T1 level. Axial images shows no acute fracture or subluxation. No prevertebral soft tissue swelling. Cervical airway is patent. There is no pneumothorax in visualized lung apices. IMPRESSION: 1. No acute intracranial abnormality. 2. No cervical spine acute fracture or subluxation. Degenerative changes as described above. Electronically Signed   By: Lahoma Crocker M.D.   On: 04/23/2015 21:12   I have personally reviewed and evaluated these images and lab results as part of my medical decision-making.   EKG Interpretation None      MDM   Final diagnoses:  None    Patient will be advised follow-up with her primary care Dr. Rockey Situ to return here as needed.  Patient agrees the plan and all questions were answered    Dalia Heading, PA-C 04/23/15 2223  Noemi Chapel, MD 04/25/15 2005

## 2015-04-23 NOTE — Telephone Encounter (Signed)
Results given to pt, and she voices understanding.  No other needs identified at this time. Knows to call us with any questions or concerns.

## 2015-04-23 NOTE — Discharge Instructions (Signed)
Return here as needed.  Keep the area clean and dry.  Use ice on the area as well

## 2015-04-23 NOTE — ED Provider Notes (Signed)
The patient is a 74 year old female who presents after having a mechanical fall striking her right forehead on the ground, she has a small puncture wound laceration, this is approximately 2 mm in diameter, no foreign body, no bleeding, no deformity of the skull, no malocclusion, no hemotympanum, no raccoon eyes, no battle sign. Neurologically the patient is moving all 4 extremities without difficulty, normal strength and sensation, normal mental status, cranial nerves III through XII are intact.  Rule out fracture, intracranial hemorrhage  Local wound care  No sutures  Medical screening examination/treatment/procedure(s) were conducted as a shared visit with non-physician practitioner(s) and myself.  I personally evaluated the patient during the encounter.  Clinical Impression:   Final diagnoses:  Minor head injury, initial encounter         Noemi Chapel, MD 04/25/15 2006

## 2015-04-23 NOTE — ED Notes (Signed)
Patient states she tripped while walking, fell onto concrete hitting her head. Bruising and bleeding noted to left front head. Patient denies blood thinners. Patient is 6 weeks post chemo, states her platelet count was normal yesterday. Patient denies LOC.

## 2015-04-30 ENCOUNTER — Other Ambulatory Visit (INDEPENDENT_AMBULATORY_CARE_PROVIDER_SITE_OTHER): Payer: Medicare Other | Admitting: *Deleted

## 2015-04-30 DIAGNOSIS — I25119 Atherosclerotic heart disease of native coronary artery with unspecified angina pectoris: Secondary | ICD-10-CM | POA: Diagnosis not present

## 2015-04-30 DIAGNOSIS — E785 Hyperlipidemia, unspecified: Secondary | ICD-10-CM | POA: Diagnosis not present

## 2015-04-30 DIAGNOSIS — I2510 Atherosclerotic heart disease of native coronary artery without angina pectoris: Secondary | ICD-10-CM | POA: Diagnosis not present

## 2015-04-30 DIAGNOSIS — I209 Angina pectoris, unspecified: Secondary | ICD-10-CM | POA: Diagnosis not present

## 2015-04-30 DIAGNOSIS — J449 Chronic obstructive pulmonary disease, unspecified: Secondary | ICD-10-CM

## 2015-04-30 LAB — COMPREHENSIVE METABOLIC PANEL
ALT: 14 U/L (ref 6–29)
AST: 21 U/L (ref 10–35)
Albumin: 3.7 g/dL (ref 3.6–5.1)
Alkaline Phosphatase: 66 U/L (ref 33–130)
BUN: 25 mg/dL (ref 7–25)
CO2: 29 mmol/L (ref 20–31)
Calcium: 10 mg/dL (ref 8.6–10.4)
Chloride: 101 mmol/L (ref 98–110)
Creat: 0.91 mg/dL (ref 0.60–0.93)
Glucose, Bld: 93 mg/dL (ref 65–99)
Potassium: 4.2 mmol/L (ref 3.5–5.3)
Sodium: 140 mmol/L (ref 135–146)
Total Bilirubin: 0.4 mg/dL (ref 0.2–1.2)
Total Protein: 7.3 g/dL (ref 6.1–8.1)

## 2015-05-06 ENCOUNTER — Ambulatory Visit (INDEPENDENT_AMBULATORY_CARE_PROVIDER_SITE_OTHER): Payer: Medicare Other | Admitting: Emergency Medicine

## 2015-05-06 ENCOUNTER — Encounter: Payer: Self-pay | Admitting: Emergency Medicine

## 2015-05-06 VITALS — BP 138/76 | HR 87 | Ht 60.0 in | Wt 179.0 lb

## 2015-05-06 DIAGNOSIS — J449 Chronic obstructive pulmonary disease, unspecified: Secondary | ICD-10-CM | POA: Diagnosis not present

## 2015-05-06 DIAGNOSIS — I2510 Atherosclerotic heart disease of native coronary artery without angina pectoris: Secondary | ICD-10-CM | POA: Diagnosis not present

## 2015-05-06 DIAGNOSIS — I2584 Coronary atherosclerosis due to calcified coronary lesion: Secondary | ICD-10-CM

## 2015-05-06 DIAGNOSIS — R748 Abnormal levels of other serum enzymes: Secondary | ICD-10-CM | POA: Diagnosis not present

## 2015-05-06 DIAGNOSIS — R200 Anesthesia of skin: Secondary | ICD-10-CM | POA: Diagnosis not present

## 2015-05-06 DIAGNOSIS — Z23 Encounter for immunization: Secondary | ICD-10-CM

## 2015-05-06 NOTE — Addendum Note (Signed)
Addended by: Desmond Dike C on: 05/06/2015 11:41 AM   Modules accepted: Orders

## 2015-05-06 NOTE — Patient Instructions (Signed)
Please continue your Symbicort twice a day  Do not restart Spiriva at this time.  We will give the Flu Shot and the Prevnar-13 today.  Follow with Dr Lamonte Sakai in 6 months or sooner if you have any problems

## 2015-05-06 NOTE — Progress Notes (Signed)
   Subjective:    Patient ID: Lynn Ramirez, female    DOB: June 22, 1941, 74 y.o.   MRN: [id number]  HPI 74 yo woman, has been followed by Dr Gwenette Greet for COPD, pleural effusions in setting of chemotherapy for serous carcinoma possibly Fallopian. History is taken from the patient and from her husband. She completed chemo August, is following with Dr Benay Spice, Dr Denman George. She has been tired, is having some neuropathy. Her breathing is stable. She is on spiriva and symbicort. Uses SABA rarely. She is interested in doing a trial off of spiriva. I personally reviewed her pulmonary function testing from 05/06/2012. This showed severe obstructive lung disease without bronchodilator response.   ROV 05/06/15 -- follow-up visit for COPD and pleural effusions in the setting of serous carcinoma on chemotherapy. At her last visit we tried stopping Spiriva to see if she would tolerate this. She doesn't notice any difference in her breathing off this. Her chemo ended 03/10/15. She still has periods of severe fatigue that can influence her breathing. She remains on Symbicort. She wants the flu shot and Prevnar-13, has now been off chemo x 7 weeks. She underwent cardiac stress testing 10/11 that was reassuring. Also tested her LE perfusion > no abnormality.    Review of Systems As per HPI     Objective:   Physical Exam Filed Vitals:   05/06/15 1100  BP: 138/76  Pulse: 87  Height: 5' (1.524 m)  Weight: 179 lb (81.194 kg)  SpO2: 95%   Gen: Pleasant, well-nourished, in no distress,  normal affect  ENT: No lesions,  mouth clear,  oropharynx clear, no postnasal drip  Neck: No JVD, no TMG, no carotid bruits  Lungs: No use of accessory muscles, no dullness to percussion, clear without rales or rhonchi  Cardiovascular: RRR, heart sounds normal, no murmur or gallops, no peripheral edema  Musculoskeletal: No deformities, no cyanosis or clubbing  Neuro: alert, non focal  Skin: Warm, no lesions or rashes     Assessment & Plan:  COPD (chronic obstructive pulmonary disease) She has tolerated discontinuation of Spiriva. We will not restart at this time. We will continue Symbicort.   Please continue your Symbicort twice a day  Do not restart Spiriva at this time.  We will give the Flu Shot and the Prevnar-13 today.  Follow with Dr Lamonte Sakai in 6 months or sooner if you have any problems

## 2015-05-06 NOTE — Assessment & Plan Note (Addendum)
She has tolerated discontinuation of Spiriva. We will not restart at this time. We will continue Symbicort.   Please continue your Symbicort twice a day  Do not restart Spiriva at this time.  We will give the Flu Shot and the Prevnar-13 today.  Follow with Dr Lamonte Sakai in 6 months or sooner if you have any problems

## 2015-05-24 ENCOUNTER — Ambulatory Visit: Payer: Medicare Other | Attending: Gynecologic Oncology | Admitting: Gynecologic Oncology

## 2015-05-24 ENCOUNTER — Encounter: Payer: Self-pay | Admitting: Gynecologic Oncology

## 2015-05-24 VITALS — BP 133/67 | HR 80 | Temp 97.6°F | Resp 18 | Wt 179.3 lb

## 2015-05-24 DIAGNOSIS — R188 Other ascites: Secondary | ICD-10-CM | POA: Diagnosis not present

## 2015-05-24 DIAGNOSIS — C569 Malignant neoplasm of unspecified ovary: Secondary | ICD-10-CM

## 2015-05-24 DIAGNOSIS — J438 Other emphysema: Secondary | ICD-10-CM

## 2015-05-24 DIAGNOSIS — R0602 Shortness of breath: Secondary | ICD-10-CM | POA: Diagnosis not present

## 2015-05-24 DIAGNOSIS — C5700 Malignant neoplasm of unspecified fallopian tube: Secondary | ICD-10-CM

## 2015-05-24 DIAGNOSIS — R971 Elevated cancer antigen 125 [CA 125]: Secondary | ICD-10-CM

## 2015-05-24 NOTE — Patient Instructions (Addendum)
We have scheduled a CT Scan of your Chest/abdomen/pelvis with IV contrast . Follow up with Dr Everitt Amber in 3 months , call with any indigestion , cough or wheezing or any additional changes  questions or concerns.    Thank you

## 2015-05-24 NOTE — Progress Notes (Signed)
Gyn Onc Follow-up Note: Gyn-Onc  Consult was originally requested by Dr. Benay Spice for the evaluation of Lynn Ramirez 74 y.o. 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  CC:  Chief Complaint  Patient presents with  . Ovarian Cancer    Follow up    Assessment/Plan:  [name]  is a 74 y.o.  year old with stage IV right fallopian tube carcinoma, s/p 3 cycles of neoadjuvant paclitaxel and carboplatin, robotic and mini-laparotomy interval debulking (optimal cytoreduction) and 3 cycles of adjuvant chemotherapy (taxol and carboplatin) completed on 03/10/15. BRCA negative.  Rising CA 125 (slow upward trend) and SOB. Will obtain CT chest/abdo/pelvis Followup with me in 3 months, and with Dr Benay Spice next week, then in 6 months.  I discussed that her elevated CA 125 is evidence of residual viable disease, however, if it is non-measurable on imaging and asymptomatic, we should not intervene with chemotherapy at this time as doing so will not improve prognosis and is associated with decreased quality of life.  HPI: Lynn Ramirez is a very pleasant 74 year old woman (G3 P3) who is seen in consultation at the request of Dr. 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  The patient reports an episode of dyspnea requiring a chest x-ray in March 2016. She underwent chest x-ray which revealed a large right pleural effusion. An ultrasound guided thoracentesis on 09/30/2014 extracted 1.6 L of bloody fluid and cytology revealed carcinoma consistent on immunostains with gynecologic primary.  The CT scan showed a large omental cake, but normal ovaries and uterus. There was moderate ascites throughout the abdomen. He was a large right pleural effusion. There was some thickening of the small bowel mesentery and bowel wall.  She underwent CA-125 evaluation on 10/14/2014 and this was elevated at 4121 units per milliliter.  Of note she had a colonoscopy in [place]  2016 but there was no ability to visualize beyond the sigmoid colon secondary to sigmoid colon spasming. This had happened 10 years previously. Therefore the patient has never had a colonoscopy take evaluation of her colon proximal to the sigmoid.  She has a history of a left nephrectomy for congenital abnormality.  She  Underwent 3 cycles of carboplatin and paclitaxel chemotherapy with Dr. Benay Spice with her third cycle being administered on 12/09/2014. CT imaging of the abdomen and pelvis to monitor for response was performed on 12/23/2014 and revealed: IMPRESSION: 1. Interval improvement in peritoneal carcinomatosis with near-complete resolution of ascites and decreased omental nodularity. 2. Significant improvement in malignant right pleural effusion. A small mildly complex right pleural effusion remains. 3. No other evidence of metastatic disease. No adnexal mass identified. 4. Stable cholelithiasis, left adrenal adenoma and atherosclerosis  CA 125 was initially elevated at 4121 in April at commencement of chemotherapy. It was decreased to 337U/mL at cycle 3.  On 12/29/14 she underwent a robotic assisted hysterectomy, BSO, omentectomy and radical tumor debulking. Disease was found in an omental cake, millial tumor deposits on the small intestine and in the ovaries and fallopian tubes. She required a minilaparotomy for resection of the omental cake. Cytoreduction was optimal with residual disease remaining only in the 43m implants on the small intestines. She had an uncomplicated postoperative course.  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 It involved both ovaries and the omentum.  Interval History:  The patient completed an additional 3 cycles of carboplatin and paclitaxel completed on 03/10/15. Her treatment was complicated by neuropathy for which she has  some additional residual symptoms.  She reports increasing SOB in the past week. She  saw her pulmonologist 3 weeks ago and was given the all clear.   CA 125 was    31 (02/10/15)   36 (03/10/15) (day 1 of cycle 6).   42 (04/22/15)      Current Meds:  Outpatient Encounter Prescriptions as of 05/24/2015  Medication Sig  . acetaminophen (TYLENOL) 325 MG tablet Take 650 mg by mouth every 6 (six) hours as needed (for pain).   Marland Kitchen antiseptic oral rinse (BIOTENE) LIQD 15 mLs by Mouth Rinse route 3 (three) times daily.  Marland Kitchen atorvastatin (LIPITOR) 10 MG tablet Take 1 tablet (10 mg total) by mouth daily.  . Biotin 2500 MCG CAPS Take 1 tablet by mouth daily.  . Calcium Carb-Cholecalciferol (CALCIUM 600 + D PO) Take 1 tablet by mouth daily.  . cholecalciferol (VITAMIN D) 1000 UNITS tablet Take 1,000 Units by mouth every morning.   . fish oil-omega-3 fatty acids 1000 MG capsule Take 1 g by mouth every morning.   . fluocinonide cream (LIDEX) 3.29 % Apply 1 application topically 2 (two) times daily as needed (For ezcema).   . gabapentin (NEURONTIN) 300 MG capsule Take 300 mg by mouth at bedtime.  . lidocaine-prilocaine (EMLA) cream Apply to port site one hour prior to use. Do not rub in. Cover with plastic.  Marland Kitchen PROAIR HFA 108 (90 BASE) MCG/ACT inhaler INHALE 2 PUFFS BY MOUTH FOUR TIMES DAILY AS NEEDED FOR WHEEZING  . SYMBICORT 160-4.5 MCG/ACT inhaler INHALE 2 PUFFS BY MOUTH TWICE DAILY  . triamterene-hydrochlorothiazide (MAXZIDE-25) 37.5-25 MG per tablet Take 1 tablet by mouth every morning.   Marland Kitchen PRESCRIPTION MEDICATION Chemo - [facility]  . [DISCONTINUED] Tiotropium Bromide Monohydrate (SPIRIVA RESPIMAT) 2.5 MCG/ACT AERS Inhale 2 puffs into the lungs daily. (Patient not taking: Reported on 05/06/2015)   No facility-administered encounter medications on file as of 05/24/2015.    Allergy:  Allergies  Allergen Reactions  . Latex Rash    Latex gloves ONLY  . Penicillins Other (See Comments)    Has patient had a PCN reaction causing immediate rash, facial/tongue/throat swelling, SOB or  lightheadedness with hypotension: Yes  Has patient had a PCN reaction causing severe rash involving mucus membranes or skin necrosis:No Has patient had a PCN reaction that required hospitalization:No Has patient had a PCN reaction occurring within the last 10 years:No If all of the above answers are "NO", then may proceed with Cephalosporin use. welps    Social Hx:   Social History   Social History  . Marital Status: Married    Spouse Name: N/A  . Number of Children: 3  . Years of Education: N/A   Occupational History  . RETIRED    Social History Main Topics  . Smoking status: Former Smoker -- 1.00 packs/day for 40 years    Types: Cigarettes    Quit date: 07/10/2013  . Smokeless tobacco: Never Used  . Alcohol Use: 0.0 oz/week    0 Standard drinks or equivalent per week     Comment: social   . Drug Use: No  . Sexual Activity: Not on file   Other Topics Concern  . Not on file   Social History Narrative    Past Surgical Hx:  Past Surgical History  Procedure Laterality Date  . Nephrectomy    . Tonsilectomy, adenoidectomy, bilateral myringotomy and tubes    . Tubal ligation    . Hand surgery Right 1980's  . Thoracentesis  several  . Robotic assisted total hysterectomy with bilateral salpingo oopherectomy Bilateral 12/29/2014    Procedure: ROBOTIC ASSISTED TOTAL LAPAROSCOPIC HYSTERECTOMY WITH BILATERAL SALPINGO OOPHORECTOMY AND OOMENTECTOMY WITH RADICAL TUMOR [place] ;  Surgeon: Everitt Amber, MD;  Location: [facility];  Service: Gynecology;  Laterality: Bilateral;  . Laparotomy N/A 12/29/2014    Procedure:  LAPAROTOMY;  Surgeon: Everitt Amber, MD;  Location: [facility];  Service: Gynecology;  Laterality: N/A;    Past Medical Hx:  Past Medical History  Diagnosis Date  . H/O hydronephrosis   . Hypertension   . Emphysema   . COPD (chronic obstructive pulmonary disease) ([place])   . Cancer ([place])     ovarian  . Eczema     hands  . GERD (gastroesophageal reflux disease)   .  History of transfusion     age 61  . Difficulty sleeping     Past Gynecological History:  G3P3, tubal ligation.  No LMP recorded. Patient is postmenopausal.  Family Hx:  Family History  Problem Relation Age of Onset  . Diabetes Father   . Lung cancer Father 24    metastasis to liver  . Pancreatic cancer Mother 103  . Stroke Paternal Grandfather   . Colon cancer Neg Hx   . Stomach cancer Neg Hx   . Heart disease Maternal Aunt   . Diabetes Paternal Uncle   . Heart Problems Maternal Grandmother   . Heart Problems Maternal Grandfather   . Diabetes Paternal Grandmother   . Stroke Paternal Grandmother   . Heart Problems Paternal Uncle   . Cancer Cousin     unknown type  . Breast cancer Cousin     dx. 32s  . Leukemia Cousin     dx. 16-17    Review of Systems:  Constitutional  Feels fatigued  ENT Normal appearing ears and nares bilaterally Skin/Breast  No rash, sores, jaundice, itching, dryness Cardiovascular  No chest pain, or edema  Pulmonary  No cough or wheeze. +shortness of breath,  Gastro Intestinal  No nausea, vomitting, or diarrhoea. No bright red blood per rectum, no abdominal pain, change in bowel movement, or constipation. See HPI Genito Urinary  No frequency, urgency, dysuria, see HPI Musculo Skeletal  No myalgia, arthralgia, joint swelling or pain  Neurologic  No weakness, numbness, change in gait,  Psychology  No depression, anxiety, insomnia.   Vitals:  Blood pressure 133/67, pulse 80, temperature 97.6 F (36.4 C), temperature source Oral, resp. rate 18, weight 179 lb 5 oz (81.336 kg), SpO2 96 %.  Physical Exam: WD in NAD Neck  Supple NROM, without any enlargements.  Lymph Node Survey No cervical supraclavicular or inguinal adenopathy Cardiovascular  Pulse normal rate, regularity and rhythm. S1 and S2 normal.  Lungs  Clear to auscultation bilateraly, without wheezes/crackles/rhonchi. Good air movement. No pleural effusion appreciated.  Skin   No rash/lesions/breakdown  Psychiatry  Alert and oriented to person, place, and time  Abdomen  Normoactive bowel sounds, abdomen soft, non-tender and overweight, no distention, without evidence of hernia. Incisions well healed. No palpable ascites or masses. Back No CVA tenderness Genito Urinary : vaginal cuff in tact, no palpable tumor on pelvic or rectovaginal exam. Extremities  No bilateral cyanosis, clubbing or edema.    I spent 30 minutes in face to face counseling with the patient regarding her diagnosis and prognosis.  Donaciano Eva, MD   05/24/2015, 10:31 AM

## 2015-05-31 ENCOUNTER — Ambulatory Visit (HOSPITAL_COMMUNITY)
Admission: RE | Admit: 2015-05-31 | Discharge: 2015-05-31 | Disposition: A | Discharge status: Home / Self Care | Payer: Medicare Other | Source: Ambulatory Visit | Attending: Gynecologic Oncology | Admitting: Gynecologic Oncology

## 2015-05-31 DIAGNOSIS — C5702 Malignant neoplasm of left fallopian tube: Secondary | ICD-10-CM | POA: Diagnosis not present

## 2015-05-31 DIAGNOSIS — R188 Other ascites: Secondary | ICD-10-CM | POA: Insufficient documentation

## 2015-05-31 DIAGNOSIS — I2510 Atherosclerotic heart disease of native coronary artery without angina pectoris: Secondary | ICD-10-CM | POA: Diagnosis not present

## 2015-05-31 DIAGNOSIS — J432 Centrilobular emphysema: Secondary | ICD-10-CM | POA: Insufficient documentation

## 2015-05-31 DIAGNOSIS — D3502 Benign neoplasm of left adrenal gland: Secondary | ICD-10-CM | POA: Insufficient documentation

## 2015-05-31 DIAGNOSIS — Z9221 Personal history of antineoplastic chemotherapy: Secondary | ICD-10-CM | POA: Diagnosis not present

## 2015-05-31 DIAGNOSIS — K5730 Diverticulosis of large intestine without perforation or abscess without bleeding: Secondary | ICD-10-CM | POA: Insufficient documentation

## 2015-05-31 DIAGNOSIS — K8020 Calculus of gallbladder without cholecystitis without obstruction: Secondary | ICD-10-CM | POA: Insufficient documentation

## 2015-05-31 DIAGNOSIS — C5700 Malignant neoplasm of unspecified fallopian tube: Secondary | ICD-10-CM | POA: Diagnosis not present

## 2015-05-31 DIAGNOSIS — J438 Other emphysema: Secondary | ICD-10-CM

## 2015-05-31 DIAGNOSIS — R200 Anesthesia of skin: Secondary | ICD-10-CM | POA: Diagnosis not present

## 2015-05-31 MED ORDER — IOHEXOL 300 MG/ML  SOLN
25.0000 mL | INTRAMUSCULAR | Status: AC
  Administered 2015-05-31: 25 mL via ORAL

## 2015-05-31 MED ORDER — IOHEXOL 300 MG/ML  SOLN
100.0000 mL | Freq: Once | INTRAMUSCULAR | Status: AC | PRN
  Administered 2015-05-31: 100 mL via INTRAVENOUS

## 2015-06-01 ENCOUNTER — Ambulatory Visit (HOSPITAL_BASED_OUTPATIENT_CLINIC_OR_DEPARTMENT_OTHER): Payer: Medicare Other | Admitting: Oncology

## 2015-06-01 ENCOUNTER — Other Ambulatory Visit (HOSPITAL_BASED_OUTPATIENT_CLINIC_OR_DEPARTMENT_OTHER): Payer: Medicare Other

## 2015-06-01 ENCOUNTER — Telehealth: Payer: Self-pay | Admitting: *Deleted

## 2015-06-01 ENCOUNTER — Ambulatory Visit (HOSPITAL_BASED_OUTPATIENT_CLINIC_OR_DEPARTMENT_OTHER): Payer: Medicare Other

## 2015-06-01 VITALS — BP 136/75 | HR 87 | Temp 97.5°F | Resp 16 | Ht 60.0 in | Wt 181.4 lb

## 2015-06-01 DIAGNOSIS — C5700 Malignant neoplasm of unspecified fallopian tube: Secondary | ICD-10-CM

## 2015-06-01 DIAGNOSIS — Z95828 Presence of other vascular implants and grafts: Secondary | ICD-10-CM

## 2015-06-01 DIAGNOSIS — C569 Malignant neoplasm of unspecified ovary: Secondary | ICD-10-CM | POA: Diagnosis not present

## 2015-06-01 DIAGNOSIS — I2584 Coronary atherosclerosis due to calcified coronary lesion: Secondary | ICD-10-CM

## 2015-06-01 DIAGNOSIS — Z452 Encounter for adjustment and management of vascular access device: Secondary | ICD-10-CM | POA: Diagnosis not present

## 2015-06-01 DIAGNOSIS — I2510 Atherosclerotic heart disease of native coronary artery without angina pectoris: Secondary | ICD-10-CM | POA: Diagnosis not present

## 2015-06-01 DIAGNOSIS — C799 Secondary malignant neoplasm of unspecified site: Secondary | ICD-10-CM

## 2015-06-01 DIAGNOSIS — C5701 Malignant neoplasm of right fallopian tube: Secondary | ICD-10-CM

## 2015-06-01 LAB — CBC WITH DIFFERENTIAL/PLATELET
BASO%: 0.7 % (ref 0.0–2.0)
BASOS ABS: 0 10*3/uL (ref 0.0–0.1)
EOS ABS: 0.1 10*3/uL (ref 0.0–0.5)
EOS%: 1.9 % (ref 0.0–7.0)
HEMATOCRIT: 36.9 % (ref 34.8–46.6)
HGB: 11.8 g/dL (ref 11.6–15.9)
LYMPH#: 0.8 10*3/uL — AB (ref 0.9–3.3)
LYMPH%: 18.8 % (ref 14.0–49.7)
MCH: 30.7 pg (ref 25.1–34.0)
MCHC: 32.1 g/dL (ref 31.5–36.0)
MCV: 95.7 fL (ref 79.5–101.0)
MONO#: 0.3 10*3/uL (ref 0.1–0.9)
MONO%: 6.8 % (ref 0.0–14.0)
NEUT#: 2.9 10*3/uL (ref 1.5–6.5)
NEUT%: 71.8 % (ref 38.4–76.8)
PLATELETS: 194 10*3/uL (ref 145–400)
RBC: 3.86 10*6/uL (ref 3.70–5.45)
RDW: 16.4 % — ABNORMAL HIGH (ref 11.2–14.5)
WBC: 4.1 10*3/uL (ref 3.9–10.3)

## 2015-06-01 LAB — CA 125: CA 125: 52 U/mL — ABNORMAL HIGH (ref <35)

## 2015-06-01 MED ORDER — SODIUM CHLORIDE 0.9 % IJ SOLN
10.0000 mL | INTRAMUSCULAR | Status: DC | PRN
  Administered 2015-06-01: 10 mL via INTRAVENOUS
  Filled 2015-06-01: qty 10

## 2015-06-01 MED ORDER — HEPARIN SOD (PORK) LOCK FLUSH 100 UNIT/ML IV SOLN
500.0000 [IU] | Freq: Once | INTRAVENOUS | Status: AC
  Administered 2015-06-01: 500 [IU] via INTRAVENOUS
  Filled 2015-06-01: qty 5

## 2015-06-01 NOTE — Progress Notes (Signed)
[facility] OFFICE PROGRESS NOTE   Diagnosis: Ovarian cancer  INTERVAL HISTORY:   [name] returns as scheduled. Good appetite. Lynn Ramirez reports increased exertional dyspnea. Lynn Ramirez can go about her usual activities, but has dyspnea with exercise. The CA 125 was mildly elevated when Lynn Ramirez saw Dr. Denman George on 04/22/2015.  Lynn Ramirez underwent restaging CT scans on 05/31/2015. No pleural effusion. No ascites or enlarged abdomen/pelvic lymph nodes. No peritoneal tumor implants.  Objective:  Vital signs in last 24 hours:  Blood pressure 136/75, pulse 87, temperature 97.5 F (36.4 C), temperature source Oral, resp. rate 16, height 5' (1.524 m), weight 181 lb 6.4 oz (82.283 kg), SpO2 97 %.    HEENT: Neck without mass Lymphatics: No cervical, supra-clavicular, axillary, or inguinal nodes Resp: Lungs clear bilaterally, distant breath sounds Cardio: Regular rate and rhythm, distant heart sounds GI: Nontender, no mass, no apparent ascites, no hepatomegaly Vascular: No leg edema   Lab Results:  Lab Results  Component Value Date   WBC 4.1 06/01/2015   HGB 11.8 06/01/2015   HCT 36.9 06/01/2015   MCV 95.7 06/01/2015   PLT 194 06/01/2015   NEUTROABS 2.9 06/01/2015      Imaging:  Ct Chest W Contrast  05/31/2015  CLINICAL DATA:  Stage IV fallopian tube carcinoma with rising CA 125 status post completion of chemotherapy. History of left nephrectomy. History of TAHBSO on 12/29/2014. EXAM: CT CHEST, ABDOMEN, AND PELVIS WITH CONTRAST TECHNIQUE: Multidetector CT imaging of the chest, abdomen and pelvis was performed following the standard protocol during bolus administration of intravenous contrast. CONTRAST:  170mL OMNIPAQUE IOHEXOL 300 MG/ML  SOLN COMPARISON:  12/23/2014 CT chest abdomen pelvis. FINDINGS: CT CHEST FINDINGS Mediastinum/Nodes: Normal heart size. No pericardial fluid/thickening. Left anterior descending coronary atherosclerosis. Great vessels are normal in course and caliber.  No central pulmonary emboli. Normal visualized thyroid. Normal esophagus. No pathologically enlarged axillary, mediastinal or hilar lymph nodes. Lungs/Pleura: No pneumothorax. No residual pleural effusion. Mild centrilobular emphysema and diffuse bronchial wall thickening. No acute consolidative airspace disease, significant pulmonary nodules or lung masses. Stable small linear scar in the left upper lobe (series 602/image 55). Musculoskeletal: No aggressive appearing focal osseous lesions. Mild-to-moderate degenerative changes in the thoracic spine. CT ABDOMEN PELVIS FINDINGS Hepatobiliary: Normal liver with no liver mass. Stable appearance of the gallbladder with a 2.8 cm calcified gallstone and additional subcentimeter layering gallstones in the gallbladder fundus, with no gallbladder wall thickening or pericholecystic fat stranding. No biliary ductal dilatation. Pancreas: Normal, with no mass or duct dilation. Spleen: Normal size. No mass. Adrenals/Urinary Tract: Stable 1.6 cm left adrenal adenoma. Normal right adrenal. Stable postsurgical changes status post left nephrectomy, with no mass or fluid collection in the left nephrectomy bed. There is fullness of the right renal collecting system without overt right hydronephrosis. No right renal mass. Normal caliber right ureter. Normal bladder. Stomach/Bowel: Grossly normal stomach. Normal caliber small bowel with no small bowel wall thickening. Appendix is not discretely visualized. Marked sigmoid diverticulosis, with no large bowel wall thickening or pericolonic fat stranding. Vascular/Lymphatic: Atherosclerotic nonaneurysmal abdominal aorta. Patent portal, splenic and right renal veins. No pathologically enlarged lymph nodes in the abdomen or pelvis. Reproductive: Status post interval hysterectomy, with no abnormal findings at the vaginal cuff. No adnexal mass. Other: No pneumoperitoneum, ascites or focal fluid collection. Status post interval omentectomy. No  residual peritoneal tumor implants are detected. Musculoskeletal: No aggressive appearing focal osseous lesions. Mild-to-moderate degenerative changes in the lumbar spine. Small fat containing right paramedian supraumbilical  ventral abdominal hernia. IMPRESSION: 1. No CT evidence of peritoneal tumor recurrence or metastatic disease in the chest, abdomen or pelvis. 2. No residual pleural effusion or ascites. 3. Several chronic findings as above, including cholelithiasis, one vessel coronary atherosclerosis, mild centrilobular emphysema, left adrenal adenoma and sigmoid diverticulosis. Electronically Signed   By: Ilona Sorrel M.D.   On: 05/31/2015 12:44   Ct Abdomen Pelvis W Contrast  05/31/2015  CLINICAL DATA:  Stage IV fallopian tube carcinoma with rising CA 125 status post completion of chemotherapy. History of left nephrectomy. History of TAHBSO on 12/29/2014. EXAM: CT CHEST, ABDOMEN, AND PELVIS WITH CONTRAST TECHNIQUE: Multidetector CT imaging of the chest, abdomen and pelvis was performed following the standard protocol during bolus administration of intravenous contrast. CONTRAST:  15mL OMNIPAQUE IOHEXOL 300 MG/ML  SOLN COMPARISON:  12/23/2014 CT chest abdomen pelvis. FINDINGS: CT CHEST FINDINGS Mediastinum/Nodes: Normal heart size. No pericardial fluid/thickening. Left anterior descending coronary atherosclerosis. Great vessels are normal in course and caliber. No central pulmonary emboli. Normal visualized thyroid. Normal esophagus. No pathologically enlarged axillary, mediastinal or hilar lymph nodes. Lungs/Pleura: No pneumothorax. No residual pleural effusion. Mild centrilobular emphysema and diffuse bronchial wall thickening. No acute consolidative airspace disease, significant pulmonary nodules or lung masses. Stable small linear scar in the left upper lobe (series 602/image 55). Musculoskeletal: No aggressive appearing focal osseous lesions. Mild-to-moderate degenerative changes in the thoracic  spine. CT ABDOMEN PELVIS FINDINGS Hepatobiliary: Normal liver with no liver mass. Stable appearance of the gallbladder with a 2.8 cm calcified gallstone and additional subcentimeter layering gallstones in the gallbladder fundus, with no gallbladder wall thickening or pericholecystic fat stranding. No biliary ductal dilatation. Pancreas: Normal, with no mass or duct dilation. Spleen: Normal size. No mass. Adrenals/Urinary Tract: Stable 1.6 cm left adrenal adenoma. Normal right adrenal. Stable postsurgical changes status post left nephrectomy, with no mass or fluid collection in the left nephrectomy bed. There is fullness of the right renal collecting system without overt right hydronephrosis. No right renal mass. Normal caliber right ureter. Normal bladder. Stomach/Bowel: Grossly normal stomach. Normal caliber small bowel with no small bowel wall thickening. Appendix is not discretely visualized. Marked sigmoid diverticulosis, with no large bowel wall thickening or pericolonic fat stranding. Vascular/Lymphatic: Atherosclerotic nonaneurysmal abdominal aorta. Patent portal, splenic and right renal veins. No pathologically enlarged lymph nodes in the abdomen or pelvis. Reproductive: Status post interval hysterectomy, with no abnormal findings at the vaginal cuff. No adnexal mass. Other: No pneumoperitoneum, ascites or focal fluid collection. Status post interval omentectomy. No residual peritoneal tumor implants are detected. Musculoskeletal: No aggressive appearing focal osseous lesions. Mild-to-moderate degenerative changes in the lumbar spine. Small fat containing right paramedian supraumbilical ventral abdominal hernia. IMPRESSION: 1. No CT evidence of peritoneal tumor recurrence or metastatic disease in the chest, abdomen or pelvis. 2. No residual pleural effusion or ascites. 3. Several chronic findings as above, including cholelithiasis, one vessel coronary atherosclerosis, mild centrilobular emphysema, left  adrenal adenoma and sigmoid diverticulosis. Electronically Signed   By: Ilona Sorrel M.D.   On: 05/31/2015 12:44    Medications: I have reviewed the patient's current medications.  Assessment/Plan: 1. 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, immunohistochemical profile consistent with a GYN primary, elevated CA 125  Staging CTs of the chest, abdomen, and pelvis on 10/06/2014 revealed a loculated right pleural effusion, ascites, and omental/mesenteric thickening  Cytology from peritoneal fluid 10/13/2014 revealed malignant cells consistent with metastatic adenocarcinoma  Biopsy of an omental mass on 11/02/2014 revealed invasive  serous carcinoma with psammoma bodies  Cycle 1 Taxol/carboplatin 10/28/2014  Cycle 2 Taxol/carboplatin 11/18/2014  Cycle 3 Taxol/carboplatin 12/09/2014  CT scan 12/23/2014 with interval improvement in peritoneal carcinomatosis with near-complete resolution of ascites and decreased omental nodularity. Significant improvement in malignant right pleural effusion.  Status post robotic-assisted laparoscopic hysterectomy with bilateral salpingoophorectomy, omentectomy, radical tumor debulking 12/29/2014. Per Dr. Serita Grit office note 01/14/2015 cytoreduction was optimal with residual disease remaining only in the 1 mm implants on the small intestine. 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  Cycle 1 adjuvant Taxol/carboplatin 01/20/2015  Cycle 2 adjuvant Taxol/carboplatin 02/10/2015  Cycle 3 adjuvant Taxol/carboplatin 03/10/2015  CA 125 on 1013 2016-42  CTs of the chest, abdomen, and pelvis 05/31/2015 with no evidence of  progressive ovarian cancer 2. COPD 3. Dyspnea secondary to COPD and the large right pleural effusion, status post therapeutic thoracentesis procedures 09/30/2014,10/09/2014, and 10/21/2014. Left thoracentesis 10/30/2014 4. Left nephrectomy as a child 5. Delayed nausea following cycle 1 Taxol/carboplatin, Aloxi/Emend added with cycle 2 with improvement. 6. Right lower extremity edema, right calf pain 12/09/2014. Negative venous Doppler 12/09/2014. 7. Diffuse pruritus following cycle 1 adjuvant Taxol/carboplatin 01/20/2015, no rash, resolved with a steroid dose pack 8. Thrombocytopenia second to chemotherapy, the carboplatin was dose reduced with cycle 2 adjuvant Taxol/carboplatin 02/10/2015 9. Chemotherapy-induced peripheral neuropathy-improved, Lynn Ramirez will discontinue gabapentin   Disposition:  There is no clinical evidence of progressive ovarian cancer. We will follow-up on the CA 125 from today. Lynn Ramirez will return for an office visit and CA 125 in 6 weeks. The plan is to continue observation in the absence of measurable disease. Lynn Ramirez will see Dr. Lamonte Sakai for worsening dyspnea.   Betsy Coder, MD  06/01/2015  9:51 AM

## 2015-06-01 NOTE — Patient Instructions (Signed)

## 2015-06-01 NOTE — Telephone Encounter (Signed)
Patient called after today's visit with MD.  "Port was flushed today and the plan is for repeat flush in 7 weeks.  I do not have a flush appointment on 07-19-2015.  Could you all schedule this and send message through MyChart."  Will notify scheduling

## 2015-06-02 ENCOUNTER — Telehealth: Payer: Self-pay | Admitting: *Deleted

## 2015-06-02 NOTE — Telephone Encounter (Signed)
"  I need to know my CA-125 results and would like to know when results will be released to MyChart."  Information provided.

## 2015-06-09 ENCOUNTER — Ambulatory Visit (HOSPITAL_COMMUNITY): Payer: Medicare Other

## 2015-06-11 DIAGNOSIS — H2513 Age-related nuclear cataract, bilateral: Secondary | ICD-10-CM | POA: Diagnosis not present

## 2015-06-16 ENCOUNTER — Encounter: Payer: Self-pay | Admitting: Internal Medicine

## 2015-06-16 ENCOUNTER — Ambulatory Visit (INDEPENDENT_AMBULATORY_CARE_PROVIDER_SITE_OTHER): Payer: Medicare Other | Admitting: Internal Medicine

## 2015-06-16 VITALS — BP 134/78 | HR 96 | Ht 61.0 in | Wt 179.8 lb

## 2015-06-16 DIAGNOSIS — J449 Chronic obstructive pulmonary disease, unspecified: Secondary | ICD-10-CM

## 2015-06-16 DIAGNOSIS — R0602 Shortness of breath: Secondary | ICD-10-CM | POA: Insufficient documentation

## 2015-06-16 DIAGNOSIS — I2510 Atherosclerotic heart disease of native coronary artery without angina pectoris: Secondary | ICD-10-CM | POA: Diagnosis not present

## 2015-06-16 DIAGNOSIS — J9611 Chronic respiratory failure with hypoxia: Secondary | ICD-10-CM

## 2015-06-16 DIAGNOSIS — I2584 Coronary atherosclerosis due to calcified coronary lesion: Secondary | ICD-10-CM | POA: Diagnosis not present

## 2015-06-16 MED ORDER — TIOTROPIUM BROMIDE MONOHYDRATE 2.5 MCG/ACT IN AERS: INHALATION_SPRAY | RESPIRATORY_TRACT | Status: DC

## 2015-06-16 NOTE — Patient Instructions (Signed)
Plan A = automatic = symbicort 160 2 puffs first thing in am followed by 2 pffs of the spiriva                                     Repeat the symbicort 160 12 hours later  Plan B = Backup - ok to use the proair up to every 4 hours if can't catch your breath  If not doing better with exercise see [place] next week before you travel to [facility] - otherwise keep follow up appt to see Dr Lamonte Sakai

## 2015-06-16 NOTE — Progress Notes (Signed)
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 She completed chemo August, 2016  is following with Dr Benay Spice, Dr Denman George.     History of Present Illness  She has been tired, is having some neuropathy. Her breathing is stable. She is on spiriva and symbicort. Uses SABA rarely. She is interested in doing a trial off of spiriva. I personally reviewed her pulmonary function testing from 05/06/2012. This showed severe obstructive lung disease without bronchodilator response.   ROV 05/06/15 -- follow-up visit for COPD and pleural effusions in the setting of serous carcinoma on chemotherapy. At her last visit we tried stopping Spiriva to see if she would tolerate this. She doesn't notice any difference in her breathing off this. Her chemo ended 03/10/15. She still has periods of severe fatigue that can influence her breathing. She remains on Symbicort. She wants the flu shot and Prevnar-13, has now been off chemo x 7 weeks. She underwent cardiac stress testing 10/11 that was reassuring. Also tested her LE perfusion > no abnormality.  Please continue your Symbicort twice a day  Do not restart Spiriva at this time.  We will give the Flu Shot and the Prevnar-13 today.    06/16/2015 acute extended ov/Wert re: copd/ miant rx symb 160 2bid Chief Complaint  Patient presents with  . Acute Visit    Pt c/o increased SOB for the past wk. She gets SOB walking short distances such as from her home to her car. She has been using proair once daily on average.   onset mid Nov 2-16and grad worse since then  sob only with exertion fine at rest/ and sleeping s assoc dry cough, gradually worse since last ov Traveling to [place] the weekend of the 16th   Better somewhat p saba, was not using it  before and now at least once a day, does increase her ex tol if uses saba  first   No obvious day to day or daytime variability or assoc chronic cough or cp or chest tightness, subjective wheeze or overt sinus or hb symptoms. No unusual exp hx or h/o childhood pna/ asthma or knowledge of premature birth.  Sleeping ok without nocturnal  or early am exacerbation  of respiratory  c/o's or need for noct saba. Also denies any obvious fluctuation of symptoms with weather or environmental changes or other aggravating or alleviating factors except as outlined above   Current Medications, Allergies, Complete Past Medical History, Past Surgical History, Family History, and Social History were reviewed in [employer] record.  ROS  The following are not active complaints unless bolded sore throat, dysphagia, dental problems, itching, sneezing,  nasal congestion or excess/ purulent secretions, ear ache,   fever, chills, sweats, unintended wt loss, classically pleuritic or exertional cp, hemoptysis,  orthopnea pnd or leg swelling, presyncope, palpitations, abdominal pain, anorexia, nausea, vomiting, diarrhea  or change in bowel or bladder habits, change in stools or urine, dysuria,hematuria,  rash, arthralgias, visual complaints, headache, numbness, weakness or ataxia or problems with walking or coordination,  change in mood/affect or memory.             Objective:   Physical Exam   Amb talkative wf nad - last saba 6 h prior to OV  Wt Readings from Last 3 Encounters:  06/16/15 179 lb 12.8 oz (81.557 kg)  06/01/15 181 lb 6.4 oz (82.283 kg)  05/24/15 179 lb 5 oz (81.336 kg)    Vital signs reviewed   Gen: Pleasant, obese  normal affect  ENT: No lesions,  mouth clear,  oropharynx clear, no postnasal drip  Neck: No JVD, no TMG, no carotid bruits  Lungs: No use of accessory muscles, no dullness to percussion, clear without rales or rhonchi  Cardiovascular: RRR, heart sounds  normal, no murmur or gallops, no peripheral edema  abd obese/ soft, limited excursion in supine position   Musculoskeletal: No deformities, no cyanosis or clubbing  Neuro: alert, non focal  Skin: Warm, no lesions or rashes    I personally reviewed images and agree with radiology impression as follows:  CT Chest   With contrast 05/31/15 1. No CT evidence of peritoneal tumor recurrence or metastatic disease in the chest, abdomen or pelvis. 2. No residual pleural effusion or ascites. 3. Several chronic findings as above, including cholelithiasis, one vessel coronary atherosclerosis, mild centrilobular emphysema, left adrenal adenoma and sigmoid diverticulosis.        Assessment & Plan:

## 2015-06-20 DIAGNOSIS — J9611 Chronic respiratory failure with hypoxia: Secondary | ICD-10-CM | POA: Insufficient documentation

## 2015-06-20 NOTE — Assessment & Plan Note (Addendum)
O2 2L cont DME: Adult Pediatric SATURATION QUALIFICATIONS: 09/28/14  Patient Saturations on Room Air at Rest = 85% - 06/16/2015   Walked RA  2 laps @ 185 ft each stopped due to  Sob/ desats to 83 brisk pace   Will need to sort 0ut 02 needs before she travels to [facility] > return in one week for this

## 2015-06-21 NOTE — Assessment & Plan Note (Signed)
CT chest 07/2011: emphysematous changes, no nodules.  PFT's 2013:  FEV1 0.87 (49%), ratio 46, DLCO 71%  - The proper method of use, as well as anticipated side effects, of a metered-dose inhaler are discussed and demonstrated to the patient. Improved effectiveness after extensive coaching during this visit to a level of approximately 90 % from a baseline of 75 % with respimat  She is clearly worse since stopping spiriva > needs rechallenge and repeat walking study  Before travel to [place] this winter  I had an extended discussion with the patient reviewing all relevant studies completed to date and  lasting 15 to 20 minutes of a 25 minute visit    Each maintenance medication was reviewed in detail including most importantly the difference between maintenance and prns and under what circumstances the prns are to be triggered using an action plan format that is not reflected in the computer generated alphabetically organized AVS.    Please see instructions for details which were reviewed in writing and the patient given a copy highlighting the part that I personally wrote and discussed at today's ov.

## 2015-06-23 ENCOUNTER — Ambulatory Visit (INDEPENDENT_AMBULATORY_CARE_PROVIDER_SITE_OTHER)
Admission: RE | Admit: 2015-06-23 | Discharge: 2015-06-23 | Disposition: A | Discharge status: Home / Self Care | Payer: Medicare Other | Source: Ambulatory Visit | Attending: Internal Medicine | Admitting: Internal Medicine

## 2015-06-23 ENCOUNTER — Encounter: Payer: Self-pay | Admitting: Internal Medicine

## 2015-06-23 ENCOUNTER — Ambulatory Visit (INDEPENDENT_AMBULATORY_CARE_PROVIDER_SITE_OTHER): Payer: Medicare Other | Admitting: Internal Medicine

## 2015-06-23 VITALS — BP 124/76 | HR 80 | Ht 61.0 in | Wt 183.0 lb

## 2015-06-23 DIAGNOSIS — I2584 Coronary atherosclerosis due to calcified coronary lesion: Secondary | ICD-10-CM

## 2015-06-23 DIAGNOSIS — J449 Chronic obstructive pulmonary disease, unspecified: Secondary | ICD-10-CM

## 2015-06-23 DIAGNOSIS — J9611 Chronic respiratory failure with hypoxia: Secondary | ICD-10-CM

## 2015-06-23 DIAGNOSIS — I2510 Atherosclerotic heart disease of native coronary artery without angina pectoris: Secondary | ICD-10-CM

## 2015-06-23 NOTE — Assessment & Plan Note (Signed)
O2 2L cont> d/c'd May 2016   SATURATION QUALIFICATIONS: 09/28/14  Patient Saturations on Room Air at Rest = 85% - 06/16/2015   Walked RA  2 laps @ 185 ft each stopped due to  Sob/ desats to 83 brisk pace  - 06/23/2015  Walked RA x 3 laps @ 185 ft each stopped due to  Leg pain, nl pace, sats still 90%   Does not want to use amb 02 so will need to pace slower, esp in [place]

## 2015-06-23 NOTE — Progress Notes (Signed)
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 She completed chemo August, 2016  is following with Dr Benay Spice, Dr Denman George.     History of Present Illness  She has been tired, is having some neuropathy. Her breathing is stable. She is on spiriva and symbicort. Uses SABA rarely. She is interested in doing a trial off of spiriva. I personally reviewed her pulmonary function testing from 05/06/2012. This showed severe obstructive lung disease without bronchodilator response.   ROV 05/06/15 -- follow-up visit for COPD and pleural effusions in the setting of serous carcinoma on chemotherapy. At her last visit we tried stopping Spiriva to see if she would tolerate this. She doesn't notice any difference in her breathing off this. Her chemo ended 03/10/15. She still has periods of severe fatigue that can influence her breathing. She remains on Symbicort. She wants the flu shot and Prevnar-13, has now been off chemo x 7 weeks. She underwent cardiac stress testing 10/11 that was reassuring. Also tested her LE perfusion > no abnormality.  Please continue your Symbicort twice a day  Do not restart Spiriva at this time.  We will give the Flu Shot and the Prevnar-13 today.    06/16/2015 acute extended ov/Lynn Ramirez re: copd/ miant rx symb 160 2bid Chief Complaint  Patient presents with  . Acute Visit    Pt c/o increased SOB for the past wk. She gets SOB walking short distances such as from her home to her car. She has been using proair once daily on average.   onset mid Nov 2-16 and grad worse since then  sob only with exertion fine at rest/ and sleeping s assoc dry cough, gradually worse since last ov Traveling to [place] the weekend of the 16th of Dec Better somewhat p saba, was not using  it before and now at least once a day, does increase her ex tol if uses saba  first  rec     06/23/2015  f/u ov/Lynn Ramirez re:  COPD GOLD III on symb/spiriva  Chief Complaint  Patient presents with  . Follow-up    Pt states that her breathing is much improved. She has not had to use rescue inhaler.   doe = MMRC1 = can walk nl pace, flat grade, can't hurry or go uphills or steps s sob        No obvious day to day or daytime variability or assoc chronic cough or cp or chest tightness, subjective wheeze or overt sinus or hb symptoms. No unusual exp hx or h/o childhood pna/ asthma or knowledge of premature birth.  Sleeping ok without nocturnal  or early am exacerbation  of respiratory  c/o's or need for noct saba. Also denies any obvious fluctuation of symptoms with weather or environmental changes or other aggravating or alleviating factors except as outlined above   Current Medications, Allergies, Complete Past Medical History, Past Surgical History, Family History, and Social History were reviewed in [employer] record.  ROS  The following are not active complaints unless bolded sore throat, dysphagia, dental problems, itching, sneezing,  nasal congestion or excess/ purulent secretions, ear ache,   fever, chills, sweats, unintended wt loss, classically pleuritic or exertional cp, hemoptysis,  orthopnea pnd or leg swelling, presyncope, palpitations, abdominal  pain, anorexia, nausea, vomiting, diarrhea  or change in bowel or bladder habits, change in stools or urine, dysuria,hematuria,  rash, arthralgias, visual complaints, headache, numbness, weakness or ataxia or problems with walking or coordination,  change in mood/affect or memory.             Objective:   Physical Exam   Amb talkative wf nad   06/23/2015      183   06/16/15 179 lb 12.8 oz (81.557 kg)  06/01/15 181 lb 6.4 oz (82.283 kg)  05/24/15 179 lb 5 oz (81.336 kg)    Vital signs reviewed   Gen:  Pleasant, obese  normal affect  ENT: No lesions,  mouth clear,  oropharynx clear, no postnasal drip - full dentures   Neck: No JVD, no TMG, no carotid bruits  Lungs: No use of accessory muscles, no dullness to percussion, slt distant bs bilaterally without rales or rhonchi  Cardiovascular: RRR, heart sounds normal, no murmur or gallops, no peripheral edema  abd obese/ soft, limited excursion in supine position   Musculoskeletal: No deformities, no cyanosis or clubbing  Neuro: alert, non focal  Skin: Warm, no lesions or rashes    CXR PA and Lateral:   06/23/2015 :    I personally reviewed images and agree with radiology impression as follows:    COPD. No active disease.        Assessment & Plan:

## 2015-06-23 NOTE — Assessment & Plan Note (Signed)
CT chest 07/2011: emphysematous changes, no nodules.  PFT's 2013:  FEV1 0.87 (49%), ratio 46, DLCO 71% - 06/16/2015  extensive coaching HFA effectiveness =    90% on respimat > try spiriva 2 qam    Improved on comibination rx s flare of copd or really limiting sob > might consider change to laba/lama on return   I had an extended discussion with the patient reviewing all relevant studies completed to date and  lasting 15 to 20 minutes of a 25 minute visit    Each maintenance medication was reviewed in detail including most importantly the difference between maintenance and prns and under what circumstances the prns are to be triggered using an action plan format that is not reflected in the computer generated alphabetically organized AVS.    Please see instructions for details which were reviewed in writing and the patient given a copy highlighting the part that I personally wrote and discussed at today's ov.

## 2015-06-23 NOTE — Patient Instructions (Addendum)
Pace yourself with walking slower and no talking and walking at the same time   Only use your albuterol as a rescue medication to be used if you can't catch your breath by resting or doing a relaxed purse lip breathing pattern.  - The less you use it, the better it will work when you need it. - Ok to use up to 2 puffs  every 4 hours if you must but call for immediate appointment if use goes up over your usual need - Don't leave home without it !!  (think of it like the spare tire for your car)   Please remember to go to the  x-ray department downstairs for your tests - we will call you with the results when they are available.  Avoid fish oil and eat more fish     Keep appt follow up with dr Lamonte Sakai

## 2015-06-23 NOTE — Progress Notes (Signed)
Quick Note:  Spoke with pt and notified of results per Dr. Wert. Pt verbalized understanding and denied any questions.  ______ 

## 2015-07-14 ENCOUNTER — Telehealth: Payer: Self-pay | Admitting: Internal Medicine

## 2015-07-14 MED ORDER — TIOTROPIUM BROMIDE MONOHYDRATE 2.5 MCG/ACT IN AERS: INHALATION_SPRAY | RESPIRATORY_TRACT | Status: DC

## 2015-07-14 NOTE — Telephone Encounter (Signed)
Patient lost her refill prescription of Spiriva and needed the medication sent to her pharmacy. Rx sent to pharmacy. Patient aware Nothing further needed.

## 2015-07-19 ENCOUNTER — Other Ambulatory Visit: Payer: Medicare Other

## 2015-07-19 ENCOUNTER — Ambulatory Visit (HOSPITAL_BASED_OUTPATIENT_CLINIC_OR_DEPARTMENT_OTHER): Payer: Medicare Other

## 2015-07-19 VITALS — BP 145/78 | HR 89 | Temp 98.0°F | Resp 20

## 2015-07-19 DIAGNOSIS — C5701 Malignant neoplasm of right fallopian tube: Secondary | ICD-10-CM | POA: Diagnosis not present

## 2015-07-19 DIAGNOSIS — C799 Secondary malignant neoplasm of unspecified site: Secondary | ICD-10-CM | POA: Diagnosis not present

## 2015-07-19 DIAGNOSIS — C569 Malignant neoplasm of unspecified ovary: Secondary | ICD-10-CM

## 2015-07-19 DIAGNOSIS — Z452 Encounter for adjustment and management of vascular access device: Secondary | ICD-10-CM | POA: Diagnosis not present

## 2015-07-19 DIAGNOSIS — Z95828 Presence of other vascular implants and grafts: Secondary | ICD-10-CM

## 2015-07-19 MED ORDER — HEPARIN SOD (PORK) LOCK FLUSH 100 UNIT/ML IV SOLN
500.0000 [IU] | Freq: Once | INTRAVENOUS | Status: AC
  Administered 2015-07-19: 500 [IU] via INTRAVENOUS
  Filled 2015-07-19: qty 5

## 2015-07-19 MED ORDER — SODIUM CHLORIDE 0.9 % IJ SOLN
10.0000 mL | INTRAMUSCULAR | Status: DC | PRN
  Administered 2015-07-19: 10 mL via INTRAVENOUS
  Filled 2015-07-19: qty 10

## 2015-07-19 NOTE — Patient Instructions (Signed)

## 2015-07-20 ENCOUNTER — Telehealth: Payer: Self-pay | Admitting: *Deleted

## 2015-07-20 ENCOUNTER — Ambulatory Visit (HOSPITAL_BASED_OUTPATIENT_CLINIC_OR_DEPARTMENT_OTHER): Payer: Medicare Other | Admitting: Oncology

## 2015-07-20 VITALS — BP 151/69 | HR 92 | Temp 98.2°F | Resp 18 | Ht 61.0 in | Wt 178.4 lb

## 2015-07-20 DIAGNOSIS — C801 Malignant (primary) neoplasm, unspecified: Secondary | ICD-10-CM | POA: Diagnosis not present

## 2015-07-20 DIAGNOSIS — C786 Secondary malignant neoplasm of retroperitoneum and peritoneum: Secondary | ICD-10-CM | POA: Diagnosis not present

## 2015-07-20 DIAGNOSIS — C482 Malignant neoplasm of peritoneum, unspecified: Secondary | ICD-10-CM

## 2015-07-20 LAB — CA 125: Cancer Antigen (CA) 125: 46.7 U/mL — ABNORMAL HIGH (ref 0.0–38.1)

## 2015-07-20 LAB — CANCER ANTIGEN 125 (PARALLEL TESTING): CA 125: 74 U/mL — AB (ref <35)

## 2015-07-20 NOTE — Telephone Encounter (Signed)
Called pt with CEA result. Slightly higher result from our usual lab. Continue observation, chest X ray next visit- per Dr. Benay Spice. Pt voiced understanding. Voiced concern at the difference between labs.

## 2015-07-20 NOTE — Telephone Encounter (Signed)
-----   Message from Ladell Pier, MD sent at 07/20/2015  1:33 PM EST ----- Please call patient, ca125 in Usual lab is slightly higher, scheduled cxr next visit, continue observation

## 2015-07-20 NOTE — Progress Notes (Signed)
  [facility] OFFICE PROGRESS NOTE   Diagnosis: Ovarian cancer  INTERVAL HISTORY:   She returns as scheduled. She took a trip to [place] for Christmas. She reports her dyspnea was worse following the trip and has now improved. Good appetite. No consistent pain.  Objective:  Vital signs in last 24 hours:  Blood pressure 151/69, pulse 92, temperature 98.2 F (36.8 C), temperature source Oral, resp. rate 18, height 5\' 1"  (1.549 m), weight 178 lb 6.4 oz (80.922 kg), SpO2 94 %.    HEENT: Neck without mass Lymphatics: No cervical, supra-clavicular, axillary, or inguinal nodes Resp: Lungs with distant breath sounds, no respiratory distress Cardio: Regular rate and rhythm GI: No apparent ascites, no mass, no hepatomegaly Vascular: No leg edema  Portacath/PICC-without erythema  Lab Results:  CA 125-74   Imaging:  Chest x-ray 06/23/2015-no effusion  Medications: I have reviewed the patient's current medications.  Assessment/Plan: 1. 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, immunohistochemical profile consistent with a GYN primary, elevated CA 125  Staging CTs of the chest, abdomen, and pelvis on 10/06/2014 revealed a loculated right pleural effusion, ascites, and omental/mesenteric thickening  Cytology from peritoneal fluid 10/13/2014 revealed malignant cells consistent with metastatic adenocarcinoma  Biopsy of an omental mass on 11/02/2014 revealed invasive serous carcinoma with psammoma bodies  Cycle 1 Taxol/carboplatin 10/28/2014  Cycle 2 Taxol/carboplatin 11/18/2014  Cycle 3 Taxol/carboplatin 12/09/2014  CT scan 12/23/2014 with interval improvement in peritoneal carcinomatosis with near-complete resolution of ascites and decreased omental nodularity. Significant improvement in malignant right pleural effusion.  Status post robotic-assisted laparoscopic hysterectomy with bilateral  salpingoophorectomy, omentectomy, radical tumor debulking 12/29/2014. Per Dr. Serita Grit office note 01/14/2015 cytoreduction was optimal with residual disease remaining only in the 1 mm implants on the small intestine. 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  Cycle 1 adjuvant Taxol/carboplatin 01/20/2015  Cycle 2 adjuvant Taxol/carboplatin 02/10/2015  Cycle 3 adjuvant Taxol/carboplatin 03/10/2015  CA 125 on 1013 2016-42  CTs of the chest, abdomen, and pelvis 05/31/2015 with no evidence of progressive ovarian cancer 2. COPD 3. Dyspnea secondary to COPD and the large right pleural effusion, status post therapeutic thoracentesis procedures 09/30/2014,10/09/2014, and 10/21/2014. Left thoracentesis 10/30/2014 4. Left nephrectomy as a child 5. Delayed nausea following cycle 1 Taxol/carboplatin, Aloxi/Emend added with cycle 2 with improvement. 6. Right lower extremity edema, right calf pain 12/09/2014. Negative venous Doppler 12/09/2014. 7. Diffuse pruritus following cycle 1 adjuvant Taxol/carboplatin 01/20/2015, no rash, resolved with a steroid dose pack 8. Thrombocytopenia second to chemotherapy, the carboplatin was dose reduced with cycle 2 adjuvant Taxol/carboplatin 02/10/2015 9. Chemotherapy-induced peripheral neuropathy-improved     Disposition:  Her clinical status appears unchanged. A chest x-ray last month showed no recurrent effusion. The CEA 125 is slightly higher. The plan is to continue observation since she is asymptomatic. We will schedule a chest x-ray when she returns for an office visit in March. She will see Dr. Denman George in the interim.  Betsy Coder, MD  07/20/2015  1:01 PM

## 2015-07-21 ENCOUNTER — Telehealth: Payer: Self-pay | Admitting: Oncology

## 2015-07-21 NOTE — Telephone Encounter (Signed)
SPOKE WITH PATIENT AND CONFIRMED APPOINTMENTS FOR 2/13 - 3/27 AND 3/28.

## 2015-07-27 ENCOUNTER — Ambulatory Visit: Payer: Medicare Other | Admitting: Oncology

## 2015-08-07 ENCOUNTER — Inpatient Hospital Stay (HOSPITAL_COMMUNITY)
Admission: EM | Admit: 2015-08-07 | Discharge: 2015-08-11 | DRG: 415 | Disposition: A | Discharge status: Home / Self Care | Payer: Medicare Other | Attending: Internal Medicine | Admitting: Internal Medicine

## 2015-08-07 ENCOUNTER — Encounter (HOSPITAL_COMMUNITY): Payer: Self-pay | Admitting: *Deleted

## 2015-08-07 ENCOUNTER — Emergency Department (HOSPITAL_COMMUNITY): Payer: Medicare Other

## 2015-08-07 DIAGNOSIS — R918 Other nonspecific abnormal finding of lung field: Secondary | ICD-10-CM | POA: Diagnosis present

## 2015-08-07 DIAGNOSIS — Z87891 Personal history of nicotine dependence: Secondary | ICD-10-CM

## 2015-08-07 DIAGNOSIS — J9811 Atelectasis: Secondary | ICD-10-CM | POA: Diagnosis not present

## 2015-08-07 DIAGNOSIS — K8062 Calculus of gallbladder and bile duct with acute cholecystitis without obstruction: Principal | ICD-10-CM | POA: Diagnosis present

## 2015-08-07 DIAGNOSIS — J910 Malignant pleural effusion: Secondary | ICD-10-CM | POA: Diagnosis present

## 2015-08-07 DIAGNOSIS — C569 Malignant neoplasm of unspecified ovary: Secondary | ICD-10-CM | POA: Diagnosis present

## 2015-08-07 DIAGNOSIS — I2510 Atherosclerotic heart disease of native coronary artery without angina pectoris: Secondary | ICD-10-CM | POA: Diagnosis present

## 2015-08-07 DIAGNOSIS — Z803 Family history of malignant neoplasm of breast: Secondary | ICD-10-CM

## 2015-08-07 DIAGNOSIS — R9389 Abnormal findings on diagnostic imaging of other specified body structures: Secondary | ICD-10-CM | POA: Insufficient documentation

## 2015-08-07 DIAGNOSIS — Z806 Family history of leukemia: Secondary | ICD-10-CM | POA: Diagnosis not present

## 2015-08-07 DIAGNOSIS — K810 Acute cholecystitis: Secondary | ICD-10-CM | POA: Diagnosis present

## 2015-08-07 DIAGNOSIS — E785 Hyperlipidemia, unspecified: Secondary | ICD-10-CM | POA: Diagnosis present

## 2015-08-07 DIAGNOSIS — Z418 Encounter for other procedures for purposes other than remedying health state: Secondary | ICD-10-CM

## 2015-08-07 DIAGNOSIS — Z905 Acquired absence of kidney: Secondary | ICD-10-CM | POA: Diagnosis not present

## 2015-08-07 DIAGNOSIS — Z01818 Encounter for other preprocedural examination: Secondary | ICD-10-CM

## 2015-08-07 DIAGNOSIS — K219 Gastro-esophageal reflux disease without esophagitis: Secondary | ICD-10-CM | POA: Diagnosis present

## 2015-08-07 DIAGNOSIS — Z833 Family history of diabetes mellitus: Secondary | ICD-10-CM | POA: Diagnosis not present

## 2015-08-07 DIAGNOSIS — J449 Chronic obstructive pulmonary disease, unspecified: Secondary | ICD-10-CM | POA: Diagnosis not present

## 2015-08-07 DIAGNOSIS — Z8249 Family history of ischemic heart disease and other diseases of the circulatory system: Secondary | ICD-10-CM

## 2015-08-07 DIAGNOSIS — R109 Unspecified abdominal pain: Secondary | ICD-10-CM | POA: Diagnosis not present

## 2015-08-07 DIAGNOSIS — I10 Essential (primary) hypertension: Secondary | ICD-10-CM | POA: Diagnosis present

## 2015-08-07 DIAGNOSIS — R938 Abnormal findings on diagnostic imaging of other specified body structures: Secondary | ICD-10-CM | POA: Diagnosis not present

## 2015-08-07 DIAGNOSIS — Z419 Encounter for procedure for purposes other than remedying health state, unspecified: Secondary | ICD-10-CM

## 2015-08-07 DIAGNOSIS — K8010 Calculus of gallbladder with chronic cholecystitis without obstruction: Secondary | ICD-10-CM | POA: Diagnosis not present

## 2015-08-07 DIAGNOSIS — R911 Solitary pulmonary nodule: Secondary | ICD-10-CM | POA: Diagnosis present

## 2015-08-07 DIAGNOSIS — J432 Centrilobular emphysema: Secondary | ICD-10-CM | POA: Diagnosis not present

## 2015-08-07 DIAGNOSIS — Z5331 Laparoscopic surgical procedure converted to open procedure: Secondary | ICD-10-CM | POA: Diagnosis not present

## 2015-08-07 DIAGNOSIS — K8000 Calculus of gallbladder with acute cholecystitis without obstruction: Secondary | ICD-10-CM | POA: Diagnosis present

## 2015-08-07 DIAGNOSIS — K819 Cholecystitis, unspecified: Secondary | ICD-10-CM | POA: Diagnosis not present

## 2015-08-07 DIAGNOSIS — Z79899 Other long term (current) drug therapy: Secondary | ICD-10-CM | POA: Diagnosis not present

## 2015-08-07 DIAGNOSIS — K8020 Calculus of gallbladder without cholecystitis without obstruction: Secondary | ICD-10-CM | POA: Diagnosis not present

## 2015-08-07 LAB — COMPREHENSIVE METABOLIC PANEL
ALK PHOS: 110 U/L (ref 38–126)
ALT: 42 U/L (ref 14–54)
AST: 75 U/L — AB (ref 15–41)
Albumin: 4 g/dL (ref 3.5–5.0)
Anion gap: 8 (ref 5–15)
BUN: 24 mg/dL — AB (ref 6–20)
CALCIUM: 10 mg/dL (ref 8.9–10.3)
CHLORIDE: 97 mmol/L — AB (ref 101–111)
CO2: 33 mmol/L — AB (ref 22–32)
CREATININE: 1 mg/dL (ref 0.44–1.00)
GFR calc non Af Amer: 55 mL/min — ABNORMAL LOW (ref >60)
Glucose, Bld: 115 mg/dL — ABNORMAL HIGH (ref 65–99)
Potassium: 4.1 mmol/L (ref 3.5–5.1)
SODIUM: 138 mmol/L (ref 135–145)
Total Bilirubin: 1.2 mg/dL (ref 0.3–1.2)
Total Protein: 7.8 g/dL (ref 6.5–8.1)

## 2015-08-07 LAB — URINE MICROSCOPIC-ADD ON
Bacteria, UA: NONE SEEN
RBC / HPF: NONE SEEN RBC/hpf (ref 0–5)

## 2015-08-07 LAB — URINALYSIS, ROUTINE W REFLEX MICROSCOPIC
Bilirubin Urine: NEGATIVE
GLUCOSE, UA: NEGATIVE mg/dL
HGB URINE DIPSTICK: NEGATIVE
KETONES UR: NEGATIVE mg/dL
Nitrite: NEGATIVE
PROTEIN: NEGATIVE mg/dL
Specific Gravity, Urine: 1.007 (ref 1.005–1.030)
pH: 7 (ref 5.0–8.0)

## 2015-08-07 LAB — CBC
HCT: 41.5 % (ref 36.0–46.0)
Hemoglobin: 13.1 g/dL (ref 12.0–15.0)
MCH: 29.8 pg (ref 26.0–34.0)
MCHC: 31.6 g/dL (ref 30.0–36.0)
MCV: 94.5 fL (ref 78.0–100.0)
PLATELETS: 254 10*3/uL (ref 150–400)
RBC: 4.39 MIL/uL (ref 3.87–5.11)
RDW: 16 % — AB (ref 11.5–15.5)
WBC: 8 10*3/uL (ref 4.0–10.5)

## 2015-08-07 LAB — LIPASE, BLOOD: LIPASE: 33 U/L (ref 11–51)

## 2015-08-07 MED ORDER — SODIUM CHLORIDE 0.9 % IV BOLUS (SEPSIS)
1000.0000 mL | Freq: Once | INTRAVENOUS | Status: AC
  Administered 2015-08-07: 1000 mL via INTRAVENOUS

## 2015-08-07 MED ORDER — MORPHINE SULFATE (PF) 4 MG/ML IV SOLN
4.0000 mg | Freq: Once | INTRAVENOUS | Status: AC
  Administered 2015-08-07: 4 mg via INTRAVENOUS
  Filled 2015-08-07: qty 1

## 2015-08-07 MED ORDER — PROMETHAZINE HCL 25 MG/ML IJ SOLN
12.5000 mg | Freq: Once | INTRAMUSCULAR | Status: AC
  Administered 2015-08-07: 12.5 mg via INTRAVENOUS
  Filled 2015-08-07: qty 1

## 2015-08-07 MED ORDER — CETYLPYRIDINIUM CHLORIDE 0.05 % MT LIQD
7.0000 mL | Freq: Two times a day (BID) | OROMUCOSAL | Status: DC
  Administered 2015-08-08 – 2015-08-11 (×7): 7 mL via OROMUCOSAL

## 2015-08-07 MED ORDER — IOHEXOL 300 MG/ML  SOLN
100.0000 mL | Freq: Once | INTRAMUSCULAR | Status: AC | PRN
  Administered 2015-08-07: 100 mL via INTRAVENOUS

## 2015-08-07 MED ORDER — SODIUM CHLORIDE 0.9 % IV SOLN
INTRAVENOUS | Status: AC
  Administered 2015-08-07: 23:00:00 via INTRAVENOUS

## 2015-08-07 NOTE — ED Notes (Addendum)
Pt reports sudden onset of severe abd pain about an hour ago.  Pt reports hx of ovarian cancer.  Pt reports nausea and took a zofran ODT.  Pt reports she has gallstones which she's had in 3 years.

## 2015-08-07 NOTE — ED Notes (Signed)
Patient stated she normally just uses her port for chemo. Will start a  Peripheral line

## 2015-08-07 NOTE — ED Provider Notes (Signed)
CSN: [id number]     Arrival date & time 08/07/15  1754 History   First MD Initiated Contact with Patient 08/07/15 1811     Chief Complaint  Patient presents with  . Abdominal Pain     (Consider location/radiation/quality/duration/timing/severity/associated sxs/prior Treatment) The history is provided by the patient.  Lynn Ramirez is a 75 y.o. female hx of HTN, COPD, reflux, ovarian cancer status post  resection here presenting with severe abdominal pain. Acute onset of severe abdominal pain about an hour prior to arrival. It is periumbilical radiate to her back. Feeling nauseated and took a dose of Zofran but did not take any pain medicine. Denies fever or chills. Denies urinary symptoms or chest pain.    Past Medical History  Diagnosis Date  . H/O hydronephrosis   . Hypertension   . Emphysema   . COPD (chronic obstructive pulmonary disease) ([place])   . Cancer ([place])     ovarian  . Eczema     hands  . GERD (gastroesophageal reflux disease)   . History of transfusion     age 74  . Difficulty sleeping    Past Surgical History  Procedure Laterality Date  . Nephrectomy    . Tonsilectomy, adenoidectomy, bilateral myringotomy and tubes    . Tubal ligation    . Hand surgery Right 1980's  . Thoracentesis      several  . Robotic assisted total hysterectomy with bilateral salpingo oopherectomy Bilateral 12/29/2014    Procedure: ROBOTIC ASSISTED TOTAL LAPAROSCOPIC HYSTERECTOMY WITH BILATERAL SALPINGO OOPHORECTOMY AND OOMENTECTOMY WITH RADICAL TUMOR [place] ;  Surgeon: Everitt Amber, MD;  Location: [facility];  Service: Gynecology;  Laterality: Bilateral;  . Laparotomy N/A 12/29/2014    Procedure:  LAPAROTOMY;  Surgeon: Everitt Amber, MD;  Location: [facility];  Service: Gynecology;  Laterality: N/A;   Family History  Problem Relation Age of Onset  . Diabetes Father   . Lung cancer Father 2    metastasis to liver  . Pancreatic cancer Mother 2  . Stroke Paternal Grandfather   . Colon cancer  Neg Hx   . Stomach cancer Neg Hx   . Heart disease Maternal Aunt   . Diabetes Paternal Uncle   . Heart Problems Maternal Grandmother   . Heart Problems Maternal Grandfather   . Diabetes Paternal Grandmother   . Stroke Paternal Grandmother   . Heart Problems Paternal Uncle   . Cancer Cousin     unknown type  . Breast cancer Cousin     dx. 69s  . Leukemia Cousin     dx. 16-17   Social History  Substance Use Topics  . Smoking status: Former Smoker -- 1.00 packs/day for 40 years    Types: Cigarettes    Quit date: 07/10/2013  . Smokeless tobacco: Never Used  . Alcohol Use: 0.0 oz/week    0 Standard drinks or equivalent per week     Comment: social    OB History    No data available     Review of Systems  Gastrointestinal: Positive for nausea and abdominal pain.  All other systems reviewed and are negative.     Allergies  Latex and Penicillins  Home Medications   Prior to Admission medications   Medication Sig Start Date End Date Taking? Authorizing Provider  acetaminophen (TYLENOL) 500 MG tablet Take 500 mg by mouth every 6 (six) hours as needed for moderate pain or headache.   Yes Historical Provider, MD  antiseptic oral rinse (BIOTENE) LIQD 15 mLs  by Mouth Rinse route 3 (three) times daily.   Yes Historical Provider, MD  atorvastatin (LIPITOR) 10 MG tablet Take 1 tablet (10 mg total) by mouth daily. 03/31/15  Yes Dorothy Spark, MD  Biotin 2500 MCG CAPS Take 1 tablet by mouth daily.   Yes Historical Provider, MD  Calcium Carb-Cholecalciferol (CALCIUM 600 + D PO) Take 1 tablet by mouth daily.   Yes Historical Provider, MD  cholecalciferol (VITAMIN D) 1000 UNITS tablet Take 1,000 Units by mouth every morning.    Yes Historical Provider, MD  fluocinonide cream (LIDEX) [id number] % Apply 1 application topically 2 (two) times daily as needed (For ezcema).    Yes Historical Provider, MD  lidocaine-prilocaine (EMLA) cream Apply to port site one hour prior to use. Do not rub in.  Cover with plastic. 10/28/14  Yes Ladell Pier, MD  Misc Natural Products (COLON CLEANSE) CAPS Take 1 capsule by mouth daily.   Yes Historical Provider, MD  ondansetron (ZOFRAN-ODT) 4 MG disintegrating tablet Take 4 mg by mouth daily as needed for nausea or vomiting.   Yes Historical Provider, MD  PROAIR HFA 108 (90 BASE) MCG/ACT inhaler INHALE 2 PUFFS BY MOUTH FOUR TIMES DAILY AS NEEDED FOR WHEEZING 07/08/14  Yes Kathee Delton, MD  SYMBICORT 160-4.5 MCG/ACT inhaler INHALE 2 PUFFS BY MOUTH TWICE DAILY 05/13/14  Yes Kathee Delton, MD  Tiotropium Bromide Monohydrate (SPIRIVA RESPIMAT) 2.5 MCG/ACT AERS 2 pffs each am 07/14/15  Yes Tanda Rockers, MD  triamterene-hydrochlorothiazide (MAXZIDE-25) 37.5-25 MG per tablet Take 1 tablet by mouth every morning.    Yes Historical Provider, MD   BP 144/65 mmHg  Pulse 81  Temp(Src) 97.7 F (36.5 C) (Oral)  Resp 16  SpO2 95% Physical Exam  Constitutional: She is oriented to person, place, and time.  Uncomfortable   HENT:  Head: Normocephalic.  Mouth/Throat: Oropharynx is clear and moist.  Eyes: Conjunctivae are normal. Pupils are equal, round, and reactive to light.  Neck: Normal range of motion. Neck supple.  Cardiovascular: Normal rate, regular rhythm and normal heart sounds.   Pulmonary/Chest: Effort normal and breath sounds normal. No respiratory distress. She has no wheezes. She has no rales.  Abdominal: Soft.  Mild diffuse tenderness, worse in periumbilical area   Musculoskeletal: Normal range of motion.  Neurological: She is alert and oriented to person, place, and time.  Skin: Skin is warm and dry.  Psychiatric: She has a normal mood and affect. Her behavior is normal. Judgment and thought content normal.  Nursing note and vitals reviewed.   ED Course  Procedures (including critical care time) Labs Review Labs Reviewed  COMPREHENSIVE METABOLIC PANEL - Abnormal; Notable for the following:    Chloride 97 (*)    CO2 33 (*)    Glucose,  Bld 115 (*)    BUN 24 (*)    AST 75 (*)    GFR calc non Af Amer 55 (*)    All other components within normal limits  CBC - Abnormal; Notable for the following:    RDW 16.0 (*)    All other components within normal limits  URINALYSIS, ROUTINE W REFLEX MICROSCOPIC (NOT AT [facility]) - Abnormal; Notable for the following:    Leukocytes, UA TRACE (*)    All other components within normal limits  URINE MICROSCOPIC-ADD ON - Abnormal; Notable for the following:    Squamous Epithelial / LPF 0-5 (*)    All other components within normal limits  LIPASE, BLOOD  Imaging Review Ct Abdomen Pelvis W Contrast  08/07/2015  CLINICAL DATA:  Sudden onset severe abdominal pain about an hour ago. History of ovarian cancer. Nausea. History of gallstones. EXAM: CT ABDOMEN AND PELVIS WITH CONTRAST TECHNIQUE: Multidetector CT imaging of the abdomen and pelvis was performed using the standard protocol following bolus administration of intravenous contrast. CONTRAST:  159mL OMNIPAQUE IOHEXOL 300 MG/ML  SOLN COMPARISON:  05/31/2015 FINDINGS: Mild dependent changes in the lung bases. 4 mm pleural-based nodule in the right lower lung posteriorly. This was not present on the previous study. Given the history of neoplasm, six-month follow-up is suggested. Cholelithiasis with large stones in the gallbladder. Increased density of the bowel which may indicate vicarious contrast excretion or gallbladder sludge. Mild pericholecystic edema. Changes are nonspecific but could indicate cholecystitis. No bile duct dilatation. The liver, spleen, pancreas, right kidney, inferior vena cava, and retroperitoneal lymph nodes are unremarkable. Left kidney is absent, presumably postoperative. 1.3 cm diameter left adrenal gland nodule is unchanged since previous study. Stomach, small bowel, and colon are not abnormally distended. Scattered diverticula in the colon. There is a midline anterior abdominal wall hernia containing a portion of the  transverse colon but without evidence of proximal obstruction. The hernia has developed since the previous study. No free air or free fluid in the abdomen. Pelvis: The appendix is not identified. Diverticulosis of the sigmoid colon without evidence of diverticulitis. Surgical absence of the uterus. No pelvic mass or lymphadenopathy. Bladder wall is not thickened. No free or loculated pelvic fluid collections. Mild degenerative changes in the spine and hips. No destructive bone lesions. IMPRESSION: Cholelithiasis with increased density in the bile and mild pericholecystic edema. Changes are nonspecific but could indicate acute cholecystitis. No significant bile duct dilatation. Midline anterior abdominal wall hernia containing transverse colon without evidence of obstruction. Diverticulosis of the colon without evidence of diverticulitis. 4 mm nodule in the right lung base may be new since previous study. Six-month follow-up recommended. Electronically Signed   By: Lucienne Capers M.D.   On: 08/07/2015 20:38   I have personally reviewed and evaluated these images and lab results as part of my medical decision-making.   EKG Interpretation None      MDM   Final diagnoses:  None   Talaysha Rayle is a 75 y.o. female here with abdominal pain. Consider biliary colic vs SBO vs renal colic vs cancer recurrence. Will give pain meds and check labs, UA, CT ab/pel,.   9:43 PM CT showed possible acute chole. Patient doesn't want surgery and pain controlled. Called Dr. Barry Dienes. She wants medical admission and she will see patient.     Wandra Arthurs, MD 08/07/15 [phone]

## 2015-08-08 ENCOUNTER — Encounter (HOSPITAL_COMMUNITY): Payer: Self-pay | Admitting: Radiology

## 2015-08-08 ENCOUNTER — Inpatient Hospital Stay (HOSPITAL_COMMUNITY): Payer: Medicare Other

## 2015-08-08 DIAGNOSIS — K810 Acute cholecystitis: Secondary | ICD-10-CM | POA: Diagnosis present

## 2015-08-08 DIAGNOSIS — R918 Other nonspecific abnormal finding of lung field: Secondary | ICD-10-CM | POA: Diagnosis present

## 2015-08-08 DIAGNOSIS — K8000 Calculus of gallbladder with acute cholecystitis without obstruction: Secondary | ICD-10-CM | POA: Diagnosis present

## 2015-08-08 LAB — COMPREHENSIVE METABOLIC PANEL
ALK PHOS: 126 U/L (ref 38–126)
ALT: 292 U/L — ABNORMAL HIGH (ref 14–54)
AST: 383 U/L — AB (ref 15–41)
Albumin: 3.3 g/dL — ABNORMAL LOW (ref 3.5–5.0)
Anion gap: 7 (ref 5–15)
BILIRUBIN TOTAL: 1.1 mg/dL (ref 0.3–1.2)
BUN: 20 mg/dL (ref 6–20)
CALCIUM: 9.1 mg/dL (ref 8.9–10.3)
CO2: 30 mmol/L (ref 22–32)
Chloride: 103 mmol/L (ref 101–111)
Creatinine, Ser: 0.99 mg/dL (ref 0.44–1.00)
GFR calc Af Amer: >60 mL/min (ref >60)
GFR, EST NON AFRICAN AMERICAN: 56 mL/min — AB (ref >60)
GLUCOSE: 104 mg/dL — AB (ref 65–99)
POTASSIUM: 3.9 mmol/L (ref 3.5–5.1)
Sodium: 140 mmol/L (ref 135–145)
TOTAL PROTEIN: 6.3 g/dL — AB (ref 6.5–8.1)

## 2015-08-08 LAB — CBC WITH DIFFERENTIAL/PLATELET
Basophils Absolute: 0 10*3/uL (ref 0.0–0.1)
Basophils Relative: 0 %
Eosinophils Absolute: 0.1 10*3/uL (ref 0.0–0.7)
Eosinophils Relative: 2 %
HEMATOCRIT: 38.4 % (ref 36.0–46.0)
HEMOGLOBIN: 11.8 g/dL — AB (ref 12.0–15.0)
LYMPHS PCT: 23 %
Lymphs Abs: 0.9 10*3/uL (ref 0.7–4.0)
MCH: 29.4 pg (ref 26.0–34.0)
MCHC: 30.7 g/dL (ref 30.0–36.0)
MCV: 95.8 fL (ref 78.0–100.0)
MONO ABS: 0.3 10*3/uL (ref 0.1–1.0)
MONOS PCT: 8 %
NEUTROS ABS: 2.6 10*3/uL (ref 1.7–7.7)
NEUTROS PCT: 67 %
Platelets: 223 10*3/uL (ref 150–400)
RBC: 4.01 MIL/uL (ref 3.87–5.11)
RDW: 16.1 % — AB (ref 11.5–15.5)
WBC: 3.9 10*3/uL — ABNORMAL LOW (ref 4.0–10.5)

## 2015-08-08 LAB — MAGNESIUM: MAGNESIUM: 1.8 mg/dL (ref 1.7–2.4)

## 2015-08-08 LAB — PHOSPHORUS: Phosphorus: 3.5 mg/dL (ref 2.5–4.6)

## 2015-08-08 MED ORDER — SODIUM CHLORIDE 0.9% FLUSH: 10.0000 mL | INTRAVENOUS | Status: DC | PRN

## 2015-08-08 MED ORDER — FLUOCINONIDE 0.05 % EX CREA
1.0000 "application " | TOPICAL_CREAM | Freq: Two times a day (BID) | CUTANEOUS | Status: DC | PRN
  Filled 2015-08-08: qty 30

## 2015-08-08 MED ORDER — SODIUM CHLORIDE 0.9 % IV SOLN
INTRAVENOUS | Status: AC
  Administered 2015-08-08: 15:00:00 via INTRAVENOUS

## 2015-08-08 MED ORDER — CIPROFLOXACIN IN D5W 400 MG/200ML IV SOLN
400.0000 mg | Freq: Two times a day (BID) | INTRAVENOUS | Status: DC
  Administered 2015-08-08 – 2015-08-11 (×7): 400 mg via INTRAVENOUS
  Filled 2015-08-08 (×9): qty 200

## 2015-08-08 MED ORDER — ENOXAPARIN SODIUM 40 MG/0.4ML ~~LOC~~ SOLN
40.0000 mg | SUBCUTANEOUS | Status: DC
  Filled 2015-08-08: qty 0.4

## 2015-08-08 MED ORDER — TIOTROPIUM BROMIDE MONOHYDRATE 2.5 MCG/ACT IN AERS: 2.0000 | INHALATION_SPRAY | Freq: Every day | RESPIRATORY_TRACT | Status: DC

## 2015-08-08 MED ORDER — ALBUTEROL SULFATE (2.5 MG/3ML) 0.083% IN NEBU: 2.5000 mg | INHALATION_SOLUTION | Freq: Four times a day (QID) | RESPIRATORY_TRACT | Status: DC | PRN

## 2015-08-08 MED ORDER — ONDANSETRON HCL 4 MG/2ML IJ SOLN
4.0000 mg | Freq: Four times a day (QID) | INTRAMUSCULAR | Status: DC | PRN
  Administered 2015-08-09: 4 mg via INTRAVENOUS
  Filled 2015-08-08: qty 2

## 2015-08-08 MED ORDER — ONDANSETRON HCL 4 MG PO TABS: 4.0000 mg | ORAL_TABLET | Freq: Four times a day (QID) | ORAL | Status: DC | PRN

## 2015-08-08 MED ORDER — TIOTROPIUM BROMIDE MONOHYDRATE 18 MCG IN CAPS
18.0000 ug | ORAL_CAPSULE | Freq: Every day | RESPIRATORY_TRACT | Status: DC
  Administered 2015-08-08 – 2015-08-11 (×4): 18 ug via RESPIRATORY_TRACT
  Filled 2015-08-08: qty 5

## 2015-08-08 MED ORDER — BUDESONIDE-FORMOTEROL FUMARATE 160-4.5 MCG/ACT IN AERO
2.0000 | INHALATION_SPRAY | Freq: Two times a day (BID) | RESPIRATORY_TRACT | Status: DC
  Administered 2015-08-08 – 2015-08-11 (×7): 2 via RESPIRATORY_TRACT
  Filled 2015-08-08: qty 6

## 2015-08-08 MED ORDER — MORPHINE SULFATE (PF) 4 MG/ML IV SOLN
4.0000 mg | INTRAVENOUS | Status: DC | PRN
  Administered 2015-08-08 (×4): 4 mg via INTRAVENOUS
  Filled 2015-08-08 (×4): qty 1

## 2015-08-08 MED ORDER — IOHEXOL 300 MG/ML  SOLN
80.0000 mL | Freq: Once | INTRAMUSCULAR | Status: AC | PRN
  Administered 2015-08-08: 75 mL via INTRAVENOUS

## 2015-08-08 NOTE — Consult Note (Signed)
Reason for Consult:Acute cholecystitis Referring Physician: Darl Householder, MD  Lynn Ramirez is an 75 y.o. female.  HPI: Pt is a 75 yo F with history of ovarian cancer/primary peritoneal cancer who presents with around 1 week of upper abdominal pain that was intermittent.  The patient thought it was probably gas pain.  However, yesterday the pain became more severe and she came to the ED. It was originally located in the periumbilical location and went to the back.  It is now the upper abdomen.  She had nausea but no vomiting.  She denies jaundice or diarrhea.  She has known she had gallstones for a while.     Past Medical History  Diagnosis Date  . H/O hydronephrosis   . Hypertension   . Emphysema   . COPD (chronic obstructive pulmonary disease) ([place])   . Cancer ([place])     ovarian  . Eczema     hands  . GERD (gastroesophageal reflux disease)   . History of transfusion     age 12  . Difficulty sleeping     Past Surgical History  Procedure Laterality Date  . Nephrectomy    . Tubal ligation    . Hand surgery Right 1980's  . Thoracentesis      several  . Robotic assisted total hysterectomy with bilateral salpingo oopherectomy Bilateral 12/29/2014    Procedure: ROBOTIC ASSISTED TOTAL LAPAROSCOPIC HYSTERECTOMY WITH BILATERAL SALPINGO OOPHORECTOMY AND OOMENTECTOMY WITH RADICAL TUMOR [place] ;  Surgeon: Everitt Amber, MD;  Location: [facility];  Service: Gynecology;  Laterality: Bilateral;  . Laparotomy N/A 12/29/2014    Procedure:  LAPAROTOMY;  Surgeon: Everitt Amber, MD;  Location: [facility];  Service: Gynecology;  Laterality: N/A;  . Tonsillectomy      Family History  Problem Relation Age of Onset  . Diabetes Father   . Lung cancer Father 4    metastasis to liver  . Pancreatic cancer Mother 29  . Stroke Paternal Grandfather   . Colon cancer Neg Hx   . Stomach cancer Neg Hx   . Heart disease Maternal Aunt   . Diabetes Paternal Uncle   . Heart Problems Maternal Grandmother   . Heart Problems  Maternal Grandfather   . Diabetes Paternal Grandmother   . Stroke Paternal Grandmother   . Heart Problems Paternal Uncle   . Cancer Cousin     unknown type  . Breast cancer Cousin     dx. 53s  . Leukemia Cousin     dx. 16-17    Social History:  reports that she quit smoking about 2 years ago. Her smoking use included Cigarettes. She has a 40 pack-year smoking history. She has never used smokeless tobacco. She reports that she drinks alcohol. She reports that she does not use illicit drugs.  Allergies:  Allergies  Allergen Reactions  . Latex Rash    Latex gloves ONLY  . Penicillins Other (See Comments)    Has patient had a PCN reaction causing immediate rash, facial/tongue/throat swelling, SOB or lightheadedness with hypotension: Yes  Has patient had a PCN reaction causing severe rash involving mucus membranes or skin necrosis:No Has patient had a PCN reaction that required hospitalization:No Has patient had a PCN reaction occurring within the last 10 years:No If all of the above answers are "NO", then may proceed with Cephalosporin use. welps    Medications:  Prior to Admission:  Prescriptions prior to admission  Medication Sig Dispense Refill Last Dose  . acetaminophen (TYLENOL) 500 MG tablet Take 500  mg by mouth every 6 (six) hours as needed for moderate pain or headache.   08/06/2015  . antiseptic oral rinse (BIOTENE) LIQD 15 mLs by Mouth Rinse route 3 (three) times daily.   08/07/2015 at Unknown time  . atorvastatin (LIPITOR) 10 MG tablet Take 1 tablet (10 mg total) by mouth daily. 90 tablet 3 08/06/2015 at Unknown time  . Biotin 2500 MCG CAPS Take 1 tablet by mouth daily.   08/07/2015 at Unknown time  . Calcium Carb-Cholecalciferol (CALCIUM 600 + D PO) Take 1 tablet by mouth daily.   08/07/2015 at Unknown time  . cholecalciferol (VITAMIN D) 1000 UNITS tablet Take 1,000 Units by mouth every morning.    08/07/2015 at Unknown time  . fluocinonide cream (LIDEX) 9.52 % Apply 1  application topically 2 (two) times daily as needed (For ezcema).    Past Week at Unknown time  . lidocaine-prilocaine (EMLA) cream Apply to port site one hour prior to use. Do not rub in. Cover with plastic. 30 g 0 07/19/2015  . Misc Natural Products (COLON CLEANSE) CAPS Take 1 capsule by mouth daily.   08/06/2015 at Unknown time  . ondansetron (ZOFRAN-ODT) 4 MG disintegrating tablet Take 4 mg by mouth daily as needed for nausea or vomiting.   08/07/2015 at Unknown time  . PROAIR HFA 108 (90 BASE) MCG/ACT inhaler INHALE 2 PUFFS BY MOUTH FOUR TIMES DAILY AS NEEDED FOR WHEEZING 1 Inhaler 3 08/07/2015 at Unknown time  . SYMBICORT 160-4.5 MCG/ACT inhaler INHALE 2 PUFFS BY MOUTH TWICE DAILY 10.2 g 6 08/07/2015 at Unknown time  . Tiotropium Bromide Monohydrate (SPIRIVA RESPIMAT) 2.5 MCG/ACT AERS 2 pffs each am 1 Inhaler 11 08/07/2015 at Unknown time  . triamterene-hydrochlorothiazide (MAXZIDE-25) 37.5-25 MG per tablet Take 1 tablet by mouth every morning.    08/07/2015 at Unknown time    Results for orders placed or performed during the hospital encounter of 08/07/15 (from the past 48 hour(s))  Urinalysis, Routine w reflex microscopic (not at [facility])     Status: Abnormal   Collection Time: 08/07/15  6:21 PM  Result Value Ref Range   Color, Urine YELLOW YELLOW   APPearance CLEAR CLEAR   Specific Gravity, Urine 1.007 1.005 - 1.030   pH 7.0 5.0 - 8.0   Glucose, UA NEGATIVE NEGATIVE mg/dL   Hgb urine dipstick NEGATIVE NEGATIVE   Bilirubin Urine NEGATIVE NEGATIVE   Ketones, ur NEGATIVE NEGATIVE mg/dL   Protein, ur NEGATIVE NEGATIVE mg/dL   Nitrite NEGATIVE NEGATIVE   Leukocytes, UA TRACE (A) NEGATIVE  Urine microscopic-add on     Status: Abnormal   Collection Time: 08/07/15  6:21 PM  Result Value Ref Range   Squamous Epithelial / LPF 0-5 (A) NONE SEEN   WBC, UA 0-5 0 - 5 WBC/hpf   RBC / HPF NONE SEEN 0 - 5 RBC/hpf   Bacteria, UA NONE SEEN NONE SEEN  Lipase, blood     Status: None   Collection Time:  08/07/15  6:30 PM  Result Value Ref Range   Lipase 33 11 - 51 U/L  Comprehensive metabolic panel     Status: Abnormal   Collection Time: 08/07/15  6:30 PM  Result Value Ref Range   Sodium 138 135 - 145 mmol/L   Potassium 4.1 3.5 - 5.1 mmol/L   Chloride 97 (L) 101 - 111 mmol/L   CO2 33 (H) 22 - 32 mmol/L   Glucose, Bld 115 (H) 65 - 99 mg/dL   BUN 24 (H) 6 -  20 mg/dL   Creatinine, Ser 1.00 0.44 - 1.00 mg/dL   Calcium 10.0 8.9 - 10.3 mg/dL   Total Protein 7.8 6.5 - 8.1 g/dL   Albumin 4.0 3.5 - 5.0 g/dL   AST 75 (H) 15 - 41 U/L   ALT 42 14 - 54 U/L   Alkaline Phosphatase 110 38 - 126 U/L   Total Bilirubin 1.2 0.3 - 1.2 mg/dL   GFR calc non Af Amer 55 (L) >60 mL/min   GFR calc Af Amer >60 >60 mL/min    Comment: (NOTE) The eGFR has been calculated using the CKD EPI equation. This calculation has not been validated in all clinical situations. eGFR's persistently <60 mL/min signify possible Chronic Kidney Disease.    Anion gap 8 5 - 15  CBC     Status: Abnormal   Collection Time: 08/07/15  6:30 PM  Result Value Ref Range   WBC 8.0 4.0 - 10.5 K/uL   RBC 4.39 3.87 - 5.11 MIL/uL   Hemoglobin 13.1 12.0 - 15.0 g/dL   HCT 41.5 36.0 - 46.0 %   MCV 94.5 78.0 - 100.0 fL   MCH 29.8 26.0 - 34.0 pg   MCHC 31.6 30.0 - 36.0 g/dL   RDW 16.0 (H) 11.5 - 15.5 %   Platelets 254 150 - 400 K/uL  CBC WITH DIFFERENTIAL     Status: Abnormal   Collection Time: 08/08/15  4:51 AM  Result Value Ref Range   WBC 3.9 (L) 4.0 - 10.5 K/uL   RBC 4.01 3.87 - 5.11 MIL/uL   Hemoglobin 11.8 (L) 12.0 - 15.0 g/dL   HCT 38.4 36.0 - 46.0 %   MCV 95.8 78.0 - 100.0 fL   MCH 29.4 26.0 - 34.0 pg   MCHC 30.7 30.0 - 36.0 g/dL   RDW 16.1 (H) 11.5 - 15.5 %   Platelets 223 150 - 400 K/uL   Neutrophils Relative % 67 %   Neutro Abs 2.6 1.7 - 7.7 K/uL   Lymphocytes Relative 23 %   Lymphs Abs 0.9 0.7 - 4.0 K/uL   Monocytes Relative 8 %   Monocytes Absolute 0.3 0.1 - 1.0 K/uL   Eosinophils Relative 2 %   Eosinophils  Absolute 0.1 0.0 - 0.7 K/uL   Basophils Relative 0 %   Basophils Absolute 0.0 0.0 - 0.1 K/uL  Comprehensive metabolic panel     Status: Abnormal   Collection Time: 08/08/15  4:51 AM  Result Value Ref Range   Sodium 140 135 - 145 mmol/L   Potassium 3.9 3.5 - 5.1 mmol/L   Chloride 103 101 - 111 mmol/L   CO2 30 22 - 32 mmol/L   Glucose, Bld 104 (H) 65 - 99 mg/dL   BUN 20 6 - 20 mg/dL   Creatinine, Ser 0.99 0.44 - 1.00 mg/dL   Calcium 9.1 8.9 - 10.3 mg/dL   Total Protein 6.3 (L) 6.5 - 8.1 g/dL   Albumin 3.3 (L) 3.5 - 5.0 g/dL   AST 383 (H) 15 - 41 U/L   ALT 292 (H) 14 - 54 U/L   Alkaline Phosphatase 126 38 - 126 U/L   Total Bilirubin 1.1 0.3 - 1.2 mg/dL   GFR calc non Af Amer 56 (L) >60 mL/min   GFR calc Af Amer >60 >60 mL/min    Comment: (NOTE) The eGFR has been calculated using the CKD EPI equation. This calculation has not been validated in all clinical situations. eGFR's persistently <60 mL/min signify possible Chronic  Kidney Disease.    Anion gap 7 5 - 15  Magnesium     Status: None   Collection Time: 08/08/15  4:51 AM  Result Value Ref Range   Magnesium 1.8 1.7 - 2.4 mg/dL  Phosphorus     Status: None   Collection Time: 08/08/15  4:51 AM  Result Value Ref Range   Phosphorus 3.5 2.5 - 4.6 mg/dL    Ct Abdomen Pelvis W Contrast  08/07/2015  CLINICAL DATA:  Sudden onset severe abdominal pain about an hour ago. History of ovarian cancer. Nausea. History of gallstones. EXAM: CT ABDOMEN AND PELVIS WITH CONTRAST TECHNIQUE: Multidetector CT imaging of the abdomen and pelvis was performed using the standard protocol following bolus administration of intravenous contrast. CONTRAST:  131m OMNIPAQUE IOHEXOL 300 MG/ML  SOLN COMPARISON:  05/31/2015 FINDINGS: Mild dependent changes in the lung bases. 4 mm pleural-based nodule in the right lower lung posteriorly. This was not present on the previous study. Given the history of neoplasm, six-month follow-up is suggested. Cholelithiasis with  large stones in the gallbladder. Increased density of the bowel which may indicate vicarious contrast excretion or gallbladder sludge. Mild pericholecystic edema. Changes are nonspecific but could indicate cholecystitis. No bile duct dilatation. The liver, spleen, pancreas, right kidney, inferior vena cava, and retroperitoneal lymph nodes are unremarkable. Left kidney is absent, presumably postoperative. 1.3 cm diameter left adrenal gland nodule is unchanged since previous study. Stomach, small bowel, and colon are not abnormally distended. Scattered diverticula in the colon. There is a midline anterior abdominal wall hernia containing a portion of the transverse colon but without evidence of proximal obstruction. The hernia has developed since the previous study. No free air or free fluid in the abdomen. Pelvis: The appendix is not identified. Diverticulosis of the sigmoid colon without evidence of diverticulitis. Surgical absence of the uterus. No pelvic mass or lymphadenopathy. Bladder wall is not thickened. No free or loculated pelvic fluid collections. Mild degenerative changes in the spine and hips. No destructive bone lesions. IMPRESSION: Cholelithiasis with increased density in the bile and mild pericholecystic edema. Changes are nonspecific but could indicate acute cholecystitis. No significant bile duct dilatation. Midline anterior abdominal wall hernia containing transverse colon without evidence of obstruction. Diverticulosis of the colon without evidence of diverticulitis. 4 mm nodule in the right lung base may be new since previous study. Six-month follow-up recommended. Electronically Signed   By: WLucienne CapersM.D.   On: 08/07/2015 20:38    Review of Systems  Constitutional: Negative.   HENT: Negative.   Eyes: Negative.   Respiratory: Negative.   Cardiovascular: Negative.   Gastrointestinal: Positive for abdominal pain.  Genitourinary: Negative.   Musculoskeletal: Negative.   Skin:  Negative.   Neurological: Negative.   Endo/Heme/Allergies: Negative.   Psychiatric/Behavioral: Negative.    Blood pressure 133/57, pulse 82, temperature 98.5 F (36.9 C), temperature source Oral, resp. rate 16, height '5\' 1"'  (1.549 m), weight 79.833 kg (176 lb), SpO2 87 %. Physical Exam  Constitutional: She is oriented to person, place, and time. She appears well-developed and well-nourished. No distress.  HENT:  Head: Normocephalic and atraumatic.  Eyes: Conjunctivae are normal. Pupils are equal, round, and reactive to light. Right eye exhibits no discharge. Left eye exhibits no discharge. No scleral icterus.  Neck: Normal range of motion. Neck supple. No tracheal deviation present. No thyromegaly present.  Cardiovascular: Normal rate and intact distal pulses.   Respiratory: Effort normal. No respiratory distress. She exhibits no tenderness.  GI: Soft. She  exhibits distension (mild). There is tenderness (RUQ tenderness).  Musculoskeletal: Normal range of motion.  Neurological: She is alert and oriented to person, place, and time.  Skin: Skin is warm and dry. No rash noted. She is not diaphoretic. No erythema. No pallor.  Psychiatric: She has a normal mood and affect. Her behavior is normal. Judgment and thought content normal.    Assessment/Plan: Acute calculous cholecystitis.   Although the Ct sounds a bit equivocal and her WBC is normal, she clinically has mild cholecystitis given her RUQ tenderness.  She has a good size calcified stone and symptoms are pretty classic.   Would plan on letting her have some liquids today and surgery tomorrow.   Given her cancer history and rising CA 125, it would be nice if gyn onc were available in case resectable disease is found.  Will keep on antibiotics.    Lynn Ramirez 08/08/2015, 7:31 AM

## 2015-08-08 NOTE — Progress Notes (Signed)
Utilization review completed.  

## 2015-08-08 NOTE — H&P (Signed)
[facility] History and Physical  Lynn Ramirez [id number] DOB: January 10, 1941 DOA: 08/07/2015  Referring physician: Shirlyn Goltz, MD PCP: Antony Blackbird, MD   Chief Complaint: Abdominal Pain  HPI: Lynn Ramirez is a 75 y.o. female with a past medical history of hydronephrosis, S/PE nephrectomy, hypertension, COPD, ovarian cancer, eczema of the hands, GERD who comes to the emergency department due to sudden onset of severe abdominal pain at about 1700.  Per patient, she started having this intense abdominal pain, associated with nausea while she was at home. She used one of her Zofran sublingual tablets which relieved the nausea and denies any having any vomiting. She states that she was feeling like usual until the pain happened. Her last and only meal today was at tortilla soup at [employer]. She denies eating any fried or fatty foods. She states that she has a known history of cholelithiasis, but denies having any postprandial discomfort or any previous symptoms until now. She denies fever, chills, chest pain, dyspnea, dizziness or diaphoresis.  In the emergency department, the patient received 4 mg of morphine, which has relieved the pain significantly. She states that pain is minimal at the moment and denies having any nausea. Workup shows a CT scan with increased bowel then sensitive and pericholecystic edema, suspicious for cholelithiasis.    Review of Systems:  Constitutional:  No weight loss, night sweats, Fevers, chills, fatigue.  HEENT:  No headaches, Difficulty swallowing,Tooth/dental problems,Sore throat,  No sneezing, itching, ear ache, nasal congestion, post nasal drip,  Cardio-vascular:  No chest pain, Orthopnea, PND, swelling in lower extremities, anasarca, dizziness, palpitations  GI:  Positive abdominal pain, nausea. No heartburn, indigestion,  vomiting, diarrhea, change in bowel habits, loss of appetite  Resp:  No shortness of breath with exertion or at  rest. No excess mucus, no productive cough, No non-productive cough, No coughing up of blood.No change in color of mucus.No wheezing.No chest wall deformity  Skin:  Positive eczematous lesions.  GU:  no dysuria, change in color of urine, no urgency or frequency. No flank pain.  Musculoskeletal:  No joint pain or swelling. No decreased range of motion. Occasional back pain.  Psych:  No change in mood or affect. No depression or anxiety. No memory loss.   Past Medical History  Diagnosis Date  . H/O hydronephrosis   . Hypertension   . Emphysema   . COPD (chronic obstructive pulmonary disease) ([place])   . Cancer ([place])     ovarian  . Eczema     hands  . GERD (gastroesophageal reflux disease)   . History of transfusion     age 75  . Difficulty sleeping    Past Surgical History  Procedure Laterality Date  . Nephrectomy    . Tubal ligation    . Hand surgery Right 1980's  . Thoracentesis      several  . Robotic assisted total hysterectomy with bilateral salpingo oopherectomy Bilateral 12/29/2014    Procedure: ROBOTIC ASSISTED TOTAL LAPAROSCOPIC HYSTERECTOMY WITH BILATERAL SALPINGO OOPHORECTOMY AND OOMENTECTOMY WITH RADICAL TUMOR [place] ;  Surgeon: Everitt Amber, MD;  Location: [facility];  Service: Gynecology;  Laterality: Bilateral;  . Laparotomy N/A 12/29/2014    Procedure:  LAPAROTOMY;  Surgeon: Everitt Amber, MD;  Location: [facility];  Service: Gynecology;  Laterality: N/A;  . Tonsillectomy     Social History:  reports that she quit smoking about 2 years ago. Her smoking use included Cigarettes. She has a 40 pack-year smoking history. She has never used smokeless  tobacco. She reports that she drinks alcohol. She reports that she does not use illicit drugs.  Allergies  Allergen Reactions  . Latex Rash    Latex gloves ONLY  . Penicillins Other (See Comments)    Has patient had a PCN reaction causing immediate rash, facial/tongue/throat swelling, SOB or lightheadedness with hypotension: Yes    Has patient had a PCN reaction causing severe rash involving mucus membranes or skin necrosis:No Has patient had a PCN reaction that required hospitalization:No Has patient had a PCN reaction occurring within the last 10 years:No If all of the above answers are "NO", then may proceed with Cephalosporin use. welps    Family History  Problem Relation Age of Onset  . Diabetes Father   . Lung cancer Father 66    metastasis to liver  . Pancreatic cancer Mother 39  . Stroke Paternal Grandfather   . Colon cancer Neg Hx   . Stomach cancer Neg Hx   . Heart disease Maternal Aunt   . Diabetes Paternal Uncle   . Heart Problems Maternal Grandmother   . Heart Problems Maternal Grandfather   . Diabetes Paternal Grandmother   . Stroke Paternal Grandmother   . Heart Problems Paternal Uncle   . Cancer Cousin     unknown type  . Breast cancer Cousin     dx. 53s  . Leukemia Cousin     dx. 16-17    Prior to Admission medications   Medication Sig Start Date End Date Taking? Authorizing Provider  acetaminophen (TYLENOL) 500 MG tablet Take 500 mg by mouth every 6 (six) hours as needed for moderate pain or headache.   Yes Historical Provider, MD  antiseptic oral rinse (BIOTENE) LIQD 15 mLs by Mouth Rinse route 3 (three) times daily.   Yes Historical Provider, MD  atorvastatin (LIPITOR) 10 MG tablet Take 1 tablet (10 mg total) by mouth daily. 03/31/15  Yes Dorothy Spark, MD  Biotin 2500 MCG CAPS Take 1 tablet by mouth daily.   Yes Historical Provider, MD  Calcium Carb-Cholecalciferol (CALCIUM 600 + D PO) Take 1 tablet by mouth daily.   Yes Historical Provider, MD  cholecalciferol (VITAMIN D) 1000 UNITS tablet Take 1,000 Units by mouth every morning.    Yes Historical Provider, MD  fluocinonide cream (LIDEX) [id number] % Apply 1 application topically 2 (two) times daily as needed (For ezcema).    Yes Historical Provider, MD  lidocaine-prilocaine (EMLA) cream Apply to port site one hour prior to use. Do  not rub in. Cover with plastic. 10/28/14  Yes Ladell Pier, MD  Misc Natural Products (COLON CLEANSE) CAPS Take 1 capsule by mouth daily.   Yes Historical Provider, MD  ondansetron (ZOFRAN-ODT) 4 MG disintegrating tablet Take 4 mg by mouth daily as needed for nausea or vomiting.   Yes Historical Provider, MD  PROAIR HFA 108 (90 BASE) MCG/ACT inhaler INHALE 2 PUFFS BY MOUTH FOUR TIMES DAILY AS NEEDED FOR WHEEZING 07/08/14  Yes Kathee Delton, MD  SYMBICORT 160-4.5 MCG/ACT inhaler INHALE 2 PUFFS BY MOUTH TWICE DAILY 05/13/14  Yes Kathee Delton, MD  Tiotropium Bromide Monohydrate (SPIRIVA RESPIMAT) 2.5 MCG/ACT AERS 2 pffs each am 07/14/15  Yes Tanda Rockers, MD  triamterene-hydrochlorothiazide (MAXZIDE-25) 37.5-25 MG per tablet Take 1 tablet by mouth every morning.    Yes Historical Provider, MD   Physical Exam: Filed Vitals:   08/07/15 1945 08/07/15 2145 08/07/15 2152 08/07/15 2243  BP: 144/65 124/52 124/52 133/72  Pulse: 84 82  94 75  Temp:    98.1 F (36.7 C)  TempSrc:    Oral  Resp:   16 16  Height:    5\' 1"  (1.549 m)  Weight:    79.833 kg (176 lb)  SpO2: 96% 94% 93% 93%    Wt Readings from Last 3 Encounters:  08/07/15 79.833 kg (176 lb)  07/20/15 80.922 kg (178 lb 6.4 oz)  06/23/15 83.008 kg (183 lb)    General:  Appears calm and comfortable. Eyes: PERRL, normal lids, irises & conjunctiva ENT: grossly normal hearing, lips and oral mucosa were moist. Neck: no LAD, masses or thyromegaly Cardiovascular: RRR, no m/r/g. No LE edema. Telemetry: N/A Respiratory: CTA bilaterally, no w/r/r. Normal respiratory effort. Abdomen: Obese, nondistended, midline surgical scar, bowel sounds positive, soft, positive RUQ and periumbilical tenderness without guarding or rebound tenderness. Skin: Eczematous patches on hands. Musculoskeletal: grossly normal tone BUE/BLE Psychiatric: grossly normal mood and affect, speech fluent and appropriate Neurologic: AAOx3, grossly non-focal.          Labs  on Admission:  Basic Metabolic Panel:  Recent Labs Lab 08/07/15 1830  NA 138  K 4.1  CL 97*  CO2 33*  GLUCOSE 115*  BUN 24*  CREATININE 1.00  CALCIUM 10.0   Liver Function Tests:  Recent Labs Lab 08/07/15 1830  AST 75*  ALT 42  ALKPHOS 110  BILITOT 1.2  PROT 7.8  ALBUMIN 4.0    Recent Labs Lab 08/07/15 1830  LIPASE 33   CBC:  Recent Labs Lab 08/07/15 1830  WBC 8.0  HGB 13.1  HCT 41.5  MCV 94.5  PLT 254    Radiological Exams on Admission: Ct Abdomen Pelvis W Contrast  08/07/2015  CLINICAL DATA:  Sudden onset severe abdominal pain about an hour ago. History of ovarian cancer. Nausea. History of gallstones. EXAM: CT ABDOMEN AND PELVIS WITH CONTRAST TECHNIQUE: Multidetector CT imaging of the abdomen and pelvis was performed using the standard protocol following bolus administration of intravenous contrast. CONTRAST:  177mL OMNIPAQUE IOHEXOL 300 MG/ML  SOLN COMPARISON:  05/31/2015 FINDINGS: Mild dependent changes in the lung bases. 4 mm pleural-based nodule in the right lower lung posteriorly. This was not present on the previous study. Given the history of neoplasm, six-month follow-up is suggested. Cholelithiasis with large stones in the gallbladder. Increased density of the bowel which may indicate vicarious contrast excretion or gallbladder sludge. Mild pericholecystic edema. Changes are nonspecific but could indicate cholecystitis. No bile duct dilatation. The liver, spleen, pancreas, right kidney, inferior vena cava, and retroperitoneal lymph nodes are unremarkable. Left kidney is absent, presumably postoperative. 1.3 cm diameter left adrenal gland nodule is unchanged since previous study. Stomach, small bowel, and colon are not abnormally distended. Scattered diverticula in the colon. There is a midline anterior abdominal wall hernia containing a portion of the transverse colon but without evidence of proximal obstruction. The hernia has developed since the previous  study. No free air or free fluid in the abdomen. Pelvis: The appendix is not identified. Diverticulosis of the sigmoid colon without evidence of diverticulitis. Surgical absence of the uterus. No pelvic mass or lymphadenopathy. Bladder wall is not thickened. No free or loculated pelvic fluid collections. Mild degenerative changes in the spine and hips. No destructive bone lesions. IMPRESSION: Cholelithiasis with increased density in the bile and mild pericholecystic edema. Changes are nonspecific but could indicate acute cholecystitis. No significant bile duct dilatation. Midline anterior abdominal wall hernia containing transverse colon without evidence of obstruction. Diverticulosis of the  colon without evidence of diverticulitis. 4 mm nodule in the right lung base may be new since previous study. Six-month follow-up recommended. Electronically Signed   By: Lucienne Capers M.D.   On: 08/07/2015 20:38     Assessment/Plan Principal Problem:   Acute cholecystitis Admit to MedSurg. Keep nothing by mouth. Analgesics and antiemetics as needed. Get right upper quadrant ultrasound in a.m. Surgical consult in the morning. Since the patient has not had a fever, does not have an elevated WBC and has not have further symptoms after the initial episode, I will defer the use of antibiotics for now, pending repeat labs, RUQ ultrasound and surgical evaluation.  Active Problems:   COPD  GOLD III Continuous. Inhaler. Continue Symbicort inhaler. Continue albuterol MDI when necessary. Oxygen supplementation as needed.    Ovarian cancer [facility]) Continue treatment and follow-up as per oncology team.     Solitary pulmonary nodule on CT I discussed these results with the patient and radiology suggestion to follow up in 6 months. I suggested to her to have her oncology team aware of this finding due to her personal history of cigarette smoking, ovarian cancer and her family history of cancer. She and her husband  voiced understanding.    Hyperlipidemia Hold atorvastatin due to symptoms and mild elevation of LFTs. Repeat LFTs in the morning.   Dr. Barry Dienes was consulted by the emergency department and will see the patient in a.m.  Code Status: Full code. DVT Prophylaxis: Lovenox SQ. Family Communication: Her husband was present in the room. Disposition Plan: Admit to MedSurg for symptoms management and surgical evaluation in a.m.  Time spent: 70 minutes were spent in the process of this admission.  Reubin Milan [facility] Pager [phone].

## 2015-08-08 NOTE — Progress Notes (Signed)
[facility] PROGRESS NOTE  Lynn Ramirez [id number] DOB: Nov 03, 1940 DOA: 08/07/2015  PCP: Antony Blackbird, MD  Brief HPI: 75 year old Caucasian female with a past medical history of ovarian cancer, hypertension, COPD, presented with the severe abdominal pain. Evaluation in the emergency department revealed findings suspicious for cholecystitis. She was hospitalized for further management.  Past medical history:  Past Medical History  Diagnosis Date  . H/O hydronephrosis   . Hypertension   . Emphysema   . COPD (chronic obstructive pulmonary disease) ([place])   . Cancer ([place])     ovarian  . Eczema     hands  . GERD (gastroesophageal reflux disease)   . History of transfusion     age 45  . Difficulty sleeping     Consultants: Gen. surgery  Procedures: None  Antibiotics: Ciprofloxacin  Subjective: Patient continues to have some discomfort in the upper abdomen but denies any nausea, vomiting. No chest pain, no shortness of breath.  Objective: Vital Signs  Filed Vitals:   08/07/15 2152 08/07/15 2243 08/08/15 0600 08/08/15 0829  BP: 124/52 133/72 133/57   Pulse: 94 75 82   Temp:  98.1 F (36.7 C) 98.5 F (36.9 C)   TempSrc:  Oral Oral   Resp: 16 16 16    Height:  5\' 1"  (1.549 m)    Weight:  79.833 kg (176 lb)    SpO2: 93% 93% 87% 90%    Intake/Output Summary (Last 24 hours) at 08/08/15 0951 Last data filed at 08/08/15 0600  Gross per 24 hour  Intake 934.16 ml  Output      0 ml  Net 934.16 ml   Filed Weights   08/07/15 2243  Weight: 79.833 kg (176 lb)    General appearance: alert, cooperative, appears stated age and no distress Resp: clear to auscultation bilaterally Cardio: regular rate and rhythm, S1, S2 normal, no murmur, click, rub or gallop GI: Abdomen is obese. Soft. Tender in the upper abdomen, especially in the right upper quadrant without any rebound, rigidity or guarding. No masses or organomegaly. Bowel sounds are present. Extremities:  extremities normal, atraumatic, no cyanosis or edema Neurologic: Awake and alert. Oriented 3. No focal neurological deficits.  Lab Results:  Basic Metabolic Panel:  Recent Labs Lab 08/07/15 1830 08/08/15 0451  NA 138 140  K 4.1 3.9  CL 97* 103  CO2 33* 30  GLUCOSE 115* 104*  BUN 24* 20  CREATININE 1.00 0.99  CALCIUM 10.0 9.1  MG  --  1.8  PHOS  --  3.5   Liver Function Tests:  Recent Labs Lab 08/07/15 1830 08/08/15 0451  AST 75* 383*  ALT 42 292*  ALKPHOS 110 126  BILITOT 1.2 1.1  PROT 7.8 6.3*  ALBUMIN 4.0 3.3*    Recent Labs Lab 08/07/15 1830  LIPASE 33   CBC:  Recent Labs Lab 08/07/15 1830 08/08/15 0451  WBC 8.0 3.9*  NEUTROABS  --  2.6  HGB 13.1 11.8*  HCT 41.5 38.4  MCV 94.5 95.8  PLT 254 223     Studies/Results: Ct Abdomen Pelvis W Contrast  08/07/2015  CLINICAL DATA:  Sudden onset severe abdominal pain about an hour ago. History of ovarian cancer. Nausea. History of gallstones. EXAM: CT ABDOMEN AND PELVIS WITH CONTRAST TECHNIQUE: Multidetector CT imaging of the abdomen and pelvis was performed using the standard protocol following bolus administration of intravenous contrast. CONTRAST:  128mL OMNIPAQUE IOHEXOL 300 MG/ML  SOLN COMPARISON:  05/31/2015 FINDINGS: Mild dependent changes in the lung  bases. 4 mm pleural-based nodule in the right lower lung posteriorly. This was not present on the previous study. Given the history of neoplasm, six-month follow-up is suggested. Cholelithiasis with large stones in the gallbladder. Increased density of the bowel which may indicate vicarious contrast excretion or gallbladder sludge. Mild pericholecystic edema. Changes are nonspecific but could indicate cholecystitis. No bile duct dilatation. The liver, spleen, pancreas, right kidney, inferior vena cava, and retroperitoneal lymph nodes are unremarkable. Left kidney is absent, presumably postoperative. 1.3 cm diameter left adrenal gland nodule is unchanged since  previous study. Stomach, small bowel, and colon are not abnormally distended. Scattered diverticula in the colon. There is a midline anterior abdominal wall hernia containing a portion of the transverse colon but without evidence of proximal obstruction. The hernia has developed since the previous study. No free air or free fluid in the abdomen. Pelvis: The appendix is not identified. Diverticulosis of the sigmoid colon without evidence of diverticulitis. Surgical absence of the uterus. No pelvic mass or lymphadenopathy. Bladder wall is not thickened. No free or loculated pelvic fluid collections. Mild degenerative changes in the spine and hips. No destructive bone lesions. IMPRESSION: Cholelithiasis with increased density in the bile and mild pericholecystic edema. Changes are nonspecific but could indicate acute cholecystitis. No significant bile duct dilatation. Midline anterior abdominal wall hernia containing transverse colon without evidence of obstruction. Diverticulosis of the colon without evidence of diverticulitis. 4 mm nodule in the right lung base may be new since previous study. Six-month follow-up recommended. Electronically Signed   By: Lucienne Capers M.D.   On: 08/07/2015 20:38    Medications:  Scheduled: . sodium chloride   Intravenous STAT  . antiseptic oral rinse  7 mL Mouth Rinse BID  . budesonide-formoterol  2 puff Inhalation BID  . ciprofloxacin  400 mg Intravenous Q12H  . tiotropium  18 mcg Inhalation Daily   Continuous: . sodium chloride 50 mL/hr at 08/08/15 0035   [id number], fluocinonide cream, morphine injection, ondansetron **OR** ondansetron (ZOFRAN) IV  Assessment/Plan:  Principal Problem:   Acute cholecystitis Active Problems:   COPD  GOLD III   Ovarian cancer ([place])   Hyperlipidemia   Solitary pulmonary nodule on lung CT   Acute calculous cholecystitis    Acute cholecystitis LFTs noted to be significantly elevated this morning. Patient's pain is  reasonably well-controlled. Patient has been seen by general surgery. Plan is for surgical intervention tomorrow. Ciprofloxacin has been initiated. Check chest x-ray and EKG. She had a negative stress test back in October.  History of COPD, gold 3 Stable. Continue with her home medications including her inhaled steroid as well as spiriva. Obtain chest x-ray. Patient will need aggressive pulmonary toilet postoperatively. Incentive spirometry.  Solitary primary nodule Detected on the lung images seen on CT scan of abdomen. Chest x-ray to be obtained as well. Patient does have a history of ovarian cancer. She is already followed by oncology. They will have to weigh in, but this can be pursued as an outpatient.. She is also followed by pulmonology.  History of ovarian cancer Not on chemotherapy currently. Followed by oncology closely. They're monitoring her tumor markers. Oncologist will be notified.  History of hyperlipidemia Holding her statin due to abnormal LFTs  ADDENDUM CXR reveals an opacity in the right mid lung. Discussed with patient. Will proceed with Ct Chest this evening as she received contrast with her CT abdomen last night.  DVT Prophylaxis: SCDs for now    Code Status: Full code  Family  Communication: Discussed with the patient and her husband  Disposition Plan: Continue current management. Surgery tomorrow.    LOS: 1 day   [facility] Pager [phone] 08/08/2015, 9:51 AM  If 7PM-7AM, please contact night-coverage at [email or web address], password [facility]

## 2015-08-09 ENCOUNTER — Encounter (HOSPITAL_COMMUNITY)
Admission: EM | Disposition: A | Discharge status: Home / Self Care | Payer: Self-pay | Source: Home / Self Care | Attending: Internal Medicine

## 2015-08-09 ENCOUNTER — Inpatient Hospital Stay (HOSPITAL_COMMUNITY): Payer: Medicare Other | Admitting: Certified Registered Nurse Anesthetist

## 2015-08-09 ENCOUNTER — Inpatient Hospital Stay (HOSPITAL_COMMUNITY): Payer: Medicare Other

## 2015-08-09 ENCOUNTER — Encounter (HOSPITAL_COMMUNITY): Payer: Self-pay | Admitting: Certified Registered Nurse Anesthetist

## 2015-08-09 DIAGNOSIS — R911 Solitary pulmonary nodule: Secondary | ICD-10-CM

## 2015-08-09 DIAGNOSIS — C569 Malignant neoplasm of unspecified ovary: Secondary | ICD-10-CM

## 2015-08-09 DIAGNOSIS — J449 Chronic obstructive pulmonary disease, unspecified: Secondary | ICD-10-CM

## 2015-08-09 HISTORY — PX: CHOLECYSTECTOMY: SHX55

## 2015-08-09 LAB — CBC
HEMATOCRIT: 36.9 % (ref 36.0–46.0)
HEMOGLOBIN: 11.4 g/dL — AB (ref 12.0–15.0)
MCH: 29.7 pg (ref 26.0–34.0)
MCHC: 30.9 g/dL (ref 30.0–36.0)
MCV: 96.1 fL (ref 78.0–100.0)
Platelets: 198 10*3/uL (ref 150–400)
RBC: 3.84 MIL/uL — ABNORMAL LOW (ref 3.87–5.11)
RDW: 16.3 % — ABNORMAL HIGH (ref 11.5–15.5)
WBC: 4.4 10*3/uL (ref 4.0–10.5)

## 2015-08-09 LAB — COMPREHENSIVE METABOLIC PANEL
ALBUMIN: 3.1 g/dL — AB (ref 3.5–5.0)
ALT: 223 U/L — ABNORMAL HIGH (ref 14–54)
ANION GAP: 8 (ref 5–15)
AST: 126 U/L — ABNORMAL HIGH (ref 15–41)
Alkaline Phosphatase: 122 U/L (ref 38–126)
BUN: 13 mg/dL (ref 6–20)
CHLORIDE: 103 mmol/L (ref 101–111)
CO2: 30 mmol/L (ref 22–32)
Calcium: 8.8 mg/dL — ABNORMAL LOW (ref 8.9–10.3)
Creatinine, Ser: 0.99 mg/dL (ref 0.44–1.00)
GFR calc non Af Amer: 56 mL/min — ABNORMAL LOW (ref >60)
GLUCOSE: 112 mg/dL — AB (ref 65–99)
POTASSIUM: 3.9 mmol/L (ref 3.5–5.1)
Sodium: 141 mmol/L (ref 135–145)
Total Bilirubin: 0.6 mg/dL (ref 0.3–1.2)
Total Protein: 5.9 g/dL — ABNORMAL LOW (ref 6.5–8.1)

## 2015-08-09 LAB — SURGICAL PCR SCREEN
MRSA, PCR: NEGATIVE
STAPHYLOCOCCUS AUREUS: NEGATIVE

## 2015-08-09 SURGERY — LAPAROSCOPIC CHOLECYSTECTOMY WITH INTRAOPERATIVE CHOLANGIOGRAM
Anesthesia: General | Site: Abdomen

## 2015-08-09 MED ORDER — LIDOCAINE HCL (CARDIAC) 20 MG/ML IV SOLN
INTRAVENOUS | Status: DC | PRN
  Administered 2015-08-09: 30 mg via INTRAVENOUS

## 2015-08-09 MED ORDER — FENTANYL CITRATE (PF) 100 MCG/2ML IJ SOLN
INTRAMUSCULAR | Status: DC | PRN
  Administered 2015-08-09 (×5): 50 ug via INTRAVENOUS

## 2015-08-09 MED ORDER — ONDANSETRON HCL 4 MG/2ML IJ SOLN
INTRAMUSCULAR | Status: DC | PRN
  Administered 2015-08-09: 4 mg via INTRAVENOUS

## 2015-08-09 MED ORDER — HYDROMORPHONE HCL 1 MG/ML IJ SOLN
INTRAMUSCULAR | Status: AC
  Filled 2015-08-09: qty 1

## 2015-08-09 MED ORDER — HYDROMORPHONE HCL 1 MG/ML IJ SOLN
INTRAMUSCULAR | Status: DC | PRN
  Administered 2015-08-09 (×2): .2 mg via INTRAVENOUS
  Administered 2015-08-09: .4 mg via INTRAVENOUS
  Administered 2015-08-09: .2 mg via INTRAVENOUS
  Administered 2015-08-09: 1 mg via INTRAVENOUS

## 2015-08-09 MED ORDER — METOCLOPRAMIDE HCL 5 MG/ML IJ SOLN
INTRAMUSCULAR | Status: DC | PRN
  Administered 2015-08-09: 10 mg via INTRAVENOUS

## 2015-08-09 MED ORDER — SODIUM CHLORIDE 0.9 % IV SOLN
INTRAVENOUS | Status: AC
  Administered 2015-08-09 (×2): via INTRAVENOUS

## 2015-08-09 MED ORDER — DEXAMETHASONE SODIUM PHOSPHATE 10 MG/ML IJ SOLN
INTRAMUSCULAR | Status: AC
  Filled 2015-08-09: qty 1

## 2015-08-09 MED ORDER — HYDROMORPHONE HCL 1 MG/ML IJ SOLN: 0.2500 mg | INTRAMUSCULAR | Status: DC | PRN

## 2015-08-09 MED ORDER — BUPIVACAINE-EPINEPHRINE 0.25% -1:200000 IJ SOLN
INTRAMUSCULAR | Status: DC | PRN
  Administered 2015-08-09: 6 mL

## 2015-08-09 MED ORDER — ROCURONIUM BROMIDE 100 MG/10ML IV SOLN
INTRAVENOUS | Status: AC
  Filled 2015-08-09: qty 1

## 2015-08-09 MED ORDER — FENTANYL CITRATE (PF) 100 MCG/2ML IJ SOLN
INTRAMUSCULAR | Status: AC
  Filled 2015-08-09: qty 4

## 2015-08-09 MED ORDER — PROPOFOL 10 MG/ML IV BOLUS
INTRAVENOUS | Status: AC
  Filled 2015-08-09: qty 20

## 2015-08-09 MED ORDER — ONDANSETRON HCL 4 MG/2ML IJ SOLN
4.0000 mg | INTRAMUSCULAR | Status: DC | PRN
  Administered 2015-08-11: 4 mg via INTRAVENOUS
  Filled 2015-08-09: qty 2

## 2015-08-09 MED ORDER — SUGAMMADEX SODIUM 500 MG/5ML IV SOLN
INTRAVENOUS | Status: AC
  Filled 2015-08-09: qty 5

## 2015-08-09 MED ORDER — HYDROMORPHONE HCL 2 MG/ML IJ SOLN
INTRAMUSCULAR | Status: AC
  Filled 2015-08-09: qty 1

## 2015-08-09 MED ORDER — POVIDONE-IODINE 10 % EX OINT
TOPICAL_OINTMENT | CUTANEOUS | Status: DC | PRN
  Administered 2015-08-09: 1 via TOPICAL

## 2015-08-09 MED ORDER — LACTATED RINGERS IV SOLN
INTRAVENOUS | Status: DC | PRN
Start: 2015-08-09 — End: 2015-08-09
  Administered 2015-08-09 (×2): via INTRAVENOUS

## 2015-08-09 MED ORDER — ONDANSETRON HCL 4 MG/2ML IJ SOLN
INTRAMUSCULAR | Status: AC
  Filled 2015-08-09: qty 2

## 2015-08-09 MED ORDER — ROCURONIUM BROMIDE 100 MG/10ML IV SOLN
INTRAVENOUS | Status: DC | PRN
  Administered 2015-08-09: 10 mg via INTRAVENOUS
  Administered 2015-08-09: 20 mg via INTRAVENOUS
  Administered 2015-08-09: 40 mg via INTRAVENOUS

## 2015-08-09 MED ORDER — FENTANYL CITRATE (PF) 250 MCG/5ML IJ SOLN
INTRAMUSCULAR | Status: AC
  Filled 2015-08-09: qty 5

## 2015-08-09 MED ORDER — FENTANYL CITRATE (PF) 100 MCG/2ML IJ SOLN
25.0000 ug | INTRAMUSCULAR | Status: DC | PRN
  Administered 2015-08-09 (×3): 50 ug via INTRAVENOUS

## 2015-08-09 MED ORDER — MORPHINE SULFATE (PF) 2 MG/ML IV SOLN
1.0000 mg | INTRAVENOUS | Status: DC | PRN
  Administered 2015-08-09 (×2): 1 mg via INTRAVENOUS
  Administered 2015-08-09 – 2015-08-10 (×6): 2 mg via INTRAVENOUS
  Filled 2015-08-09 (×7): qty 1

## 2015-08-09 MED ORDER — ONDANSETRON HCL 4 MG PO TABS: 2.0000 mg | ORAL_TABLET | ORAL | Status: DC | PRN

## 2015-08-09 MED ORDER — DEXAMETHASONE SODIUM PHOSPHATE 4 MG/ML IJ SOLN
INTRAMUSCULAR | Status: DC | PRN
  Administered 2015-08-09: 10 mg via INTRAVENOUS

## 2015-08-09 MED ORDER — ALBUTEROL SULFATE HFA 108 (90 BASE) MCG/ACT IN AERS
INHALATION_SPRAY | RESPIRATORY_TRACT | Status: AC
  Filled 2015-08-09: qty 6.7

## 2015-08-09 MED ORDER — BUPIVACAINE-EPINEPHRINE (PF) 0.25% -1:200000 IJ SOLN
INTRAMUSCULAR | Status: AC
  Filled 2015-08-09: qty 60

## 2015-08-09 MED ORDER — POVIDONE-IODINE 10 % EX OINT
TOPICAL_OINTMENT | CUTANEOUS | Status: AC
  Filled 2015-08-09: qty 28.35

## 2015-08-09 MED ORDER — IOHEXOL 300 MG/ML  SOLN
INTRAMUSCULAR | Status: DC | PRN
Start: 2015-08-09 — End: 2015-08-09
  Administered 2015-08-09: 20 mL

## 2015-08-09 MED ORDER — ALBUTEROL SULFATE HFA 108 (90 BASE) MCG/ACT IN AERS
INHALATION_SPRAY | RESPIRATORY_TRACT | Status: DC | PRN
  Administered 2015-08-09: 5 via RESPIRATORY_TRACT

## 2015-08-09 MED ORDER — ONDANSETRON HCL 4 MG/2ML IJ SOLN
4.0000 mg | Freq: Once | INTRAMUSCULAR | Status: AC | PRN
  Administered 2015-08-09: 4 mg via INTRAVENOUS

## 2015-08-09 MED ORDER — PROMETHAZINE HCL 25 MG/ML IJ SOLN
INTRAMUSCULAR | Status: AC
  Filled 2015-08-09: qty 1

## 2015-08-09 MED ORDER — LIDOCAINE HCL (CARDIAC) 20 MG/ML IV SOLN
INTRAVENOUS | Status: AC
  Filled 2015-08-09: qty 5

## 2015-08-09 MED ORDER — METOCLOPRAMIDE HCL 5 MG/ML IJ SOLN
INTRAMUSCULAR | Status: AC
  Filled 2015-08-09: qty 2

## 2015-08-09 MED ORDER — SUGAMMADEX SODIUM 500 MG/5ML IV SOLN
INTRAVENOUS | Status: DC | PRN
  Administered 2015-08-09 (×2): 200 mg via INTRAVENOUS

## 2015-08-09 MED ORDER — PROPOFOL 10 MG/ML IV BOLUS
INTRAVENOUS | Status: DC | PRN
  Administered 2015-08-09: 120 mg via INTRAVENOUS

## 2015-08-09 SURGICAL SUPPLY — 42 items
APPLIER CLIP 5 13 M/L LIGAMAX5 (MISCELLANEOUS) ×3
BLADE EXTENDED COATED 6.5IN (ELECTRODE) ×3 IMPLANT
CATH REDDICK CHOLANGI 4FR 50CM (CATHETERS) ×3 IMPLANT
CHLORAPREP W/TINT 26ML (MISCELLANEOUS) ×3 IMPLANT
CHOLANGIOGRAM CATH TAUT (CATHETERS) ×3 IMPLANT
CLIP APPLIE 5 13 M/L LIGAMAX5 (MISCELLANEOUS) ×1 IMPLANT
COVER MAYO STAND STRL (DRAPES) ×3 IMPLANT
COVER SURGICAL LIGHT HANDLE (MISCELLANEOUS) ×3 IMPLANT
DECANTER SPIKE VIAL GLASS SM (MISCELLANEOUS) IMPLANT
DRAPE C-ARM 42X120 X-RAY (DRAPES) ×3 IMPLANT
DRAPE LAPAROSCOPIC ABDOMINAL (DRAPES) ×3 IMPLANT
ELECT BLADE TIP CTD 4 INCH (ELECTRODE) ×3 IMPLANT
ELECT PENCIL ROCKER SW 15FT (MISCELLANEOUS) ×3 IMPLANT
ELECT REM PT RETURN 9FT ADLT (ELECTROSURGICAL) ×3
ELECTRODE REM PT RTRN 9FT ADLT (ELECTROSURGICAL) ×1 IMPLANT
GAUZE SPONGE 4X4 12PLY STRL (GAUZE/BANDAGES/DRESSINGS) ×3 IMPLANT
GLOVE BIO SURGEON STRL SZ7.5 (GLOVE) ×6 IMPLANT
GOWN STRL REUS W/ TWL XL LVL3 (GOWN DISPOSABLE) IMPLANT
GOWN STRL REUS W/TWL LRG LVL3 (GOWN DISPOSABLE) ×3 IMPLANT
GOWN STRL REUS W/TWL XL LVL3 (GOWN DISPOSABLE) ×3 IMPLANT
HEMOSTAT SURGICEL 4X8 (HEMOSTASIS) IMPLANT
IV CATH 14GX2 1/4 (CATHETERS) ×3 IMPLANT
KIT BASIN OR (CUSTOM PROCEDURE TRAY) ×3 IMPLANT
LIQUID BAND (GAUZE/BANDAGES/DRESSINGS) ×3 IMPLANT
POUCH SPECIMEN RETRIEVAL 10MM (ENDOMECHANICALS) ×3 IMPLANT
SET IRRIG TUBING LAPAROSCOPIC (IRRIGATION / IRRIGATOR) ×3 IMPLANT
SLEEVE XCEL OPT CAN 5 100 (ENDOMECHANICALS) ×6 IMPLANT
SPONGE LAP 18X18 X RAY DECT (DISPOSABLE) ×3 IMPLANT
STAPLER VISISTAT 35W (STAPLE) ×3 IMPLANT
STOPCOCK 4 WAY LG BORE MALE ST (IV SETS) ×3 IMPLANT
SUT MNCRL AB 4-0 PS2 18 (SUTURE) ×3 IMPLANT
SUT PDS AB 1 CTX 36 (SUTURE) ×12 IMPLANT
SUT SILK 2 0 (SUTURE) ×2
SUT SILK 2-0 18XBRD TIE 12 (SUTURE) ×1 IMPLANT
SYR BULB IRRIGATION 50ML (SYRINGE) ×3 IMPLANT
TAPE CLOTH SURG 4X10 WHT LF (GAUZE/BANDAGES/DRESSINGS) ×3 IMPLANT
TOWEL OR 17X26 10 PK STRL BLUE (TOWEL DISPOSABLE) ×3 IMPLANT
TOWEL OR NON WOVEN STRL DISP B (DISPOSABLE) ×3 IMPLANT
TRAY LAPAROSCOPIC (CUSTOM PROCEDURE TRAY) ×3 IMPLANT
TROCAR BLADELESS OPT 5 100 (ENDOMECHANICALS) ×3 IMPLANT
TROCAR XCEL BLUNT TIP 100MML (ENDOMECHANICALS) ×3 IMPLANT
YANKAUER SUCT BULB TIP 10FT TU (MISCELLANEOUS) ×3 IMPLANT

## 2015-08-09 NOTE — Op Note (Signed)
08/07/2015 - 08/09/2015  1:36 PM  PATIENT:  Lynn Ramirez  75 y.o. female  PRE-OPERATIVE DIAGNOSIS:  gallstones  POST-OPERATIVE DIAGNOSIS:  gallstones  PROCEDURE:  Procedure(s): Attempted LAPAROSCOPIC coverted  open CHOLECYSTECTOMY WITH INTRAOPERATIVE CHOLANGIOGRAM (N/A)  SURGEON:  Surgeon(s) and Role:    * Jovita Kussmaul, MD - Primary    * Coralie Keens, MD - Assisting  PHYSICIAN ASSISTANT:   ASSISTANTS: Dr. Ninfa Linden   ANESTHESIA:   general  EBL:  Total I/O In: 0  Out: 200 [Urine:200]  BLOOD ADMINISTERED:none  DRAINS: none   LOCAL MEDICATIONS USED:  MARCAINE     SPECIMEN:  Source of Specimen:  gallbladder  DISPOSITION OF SPECIMEN:  PATHOLOGY  COUNTS:  YES  TOURNIQUET:  * No tourniquets in log *  DICTATION: .Dragon Dictation   After informed consent was obtained the patient was brought to the operating room and placed in the supine position on the operating room table. After adequate induction of general anesthesia the patient's abdomen was prepped with ChloraPrep, allowed to dry, and draped in usual sterile manner. A site was chosen in the right upper quadrant for accessing the abdominal cavity given the patient's previous abdominal surgery. The area was infiltrated with quarter percent Marcaine. A small stab incision was made with a 15 blade knife. A 5 mm Optiview port was used to bluntly dissect through the abdominal wall layers under direct vision. Given her previous surgery I was not comfortable with the layers that I was seeing so we stopped the laparoscopy. A right subcostal incision was made with a 10 blade knife. The incision was carried through the skin and subcutaneous tissue sharply with the electrocautery until the fascia of the abdominal wall was encountered. The fascial and muscle layers were incised sharply with the electrocautery. A peritoneal opening was identified at the trocar site. This opening was probed bluntly with a hemostat and it was apparent  that the abdominal wall in this location was free of adhesions. The rest of the incision was opened under direct vision with the electrocautery. A Balfour retractor was deployed. The gallbladder was identified. The abdomen was generally inspected through the incision and there was definite peritoneal studding that was small and light at the upper portion of the abdomen but became more concentrated and dense down to the pelvis. The dome of the gallbladder was grasped with a Kelly clamp and the gallbladder was separated from the liver bed using a top down technique by blunt hemostat dissection and sharp dissection with the electrocautery. Once we reached the neck of the gallbladder the cystic duct was identified. The cystic artery was also identified and controlled with clips. A small nick was made in the cystic duct near the neck of the gallbladder. A taut catheter was placed through this opening and clamped with a cystic duct clamp. A cholangiogram was obtained that showed no filling defects and good anatomy although the contrast would not empty into the duodenum. It was difficult to pass the catheter any further so at this point the cholangiogram was stopped. The cystic duct was controlled with a clip and 2-0 silk. A Kelly clamp was placed across the gallbladder neck and the duct was divided between the clamp and the clip and silk tie. The gallbladder was then removed from the patient. The wound was irrigated with copious amounts of saline. The liver bed was examined and found to be hemostatic. The fascia of the abdominal wall was then closed in 2 layers of running #1 PDS.  The wound was irrigated with copious amounts of saline. The skin was then closed with staples. Betadine ointment and sterile dressings were applied. The patient tolerated the procedure well. At the end of the case all needle sponge and instrument counts were correct. The patient was then awakened and taken to recovery in stable condition.  PLAN  OF CARE: Admit to inpatient   PATIENT DISPOSITION:  PACU - hemodynamically stable.   Delay start of Pharmacological VTE agent (>24hrs) due to surgical blood loss or risk of bleeding: no

## 2015-08-09 NOTE — Anesthesia Procedure Notes (Signed)
Procedure Name: Intubation Date/Time: 08/09/2015 12:07 PM Performed by: Deliah Boston Pre-anesthesia Checklist: Patient identified, Emergency Drugs available, Suction available and Patient being monitored Patient Re-evaluated:Patient Re-evaluated prior to inductionOxygen Delivery Method: Circle System Utilized Preoxygenation: Pre-oxygenation with 100% oxygen Intubation Type: IV induction Ventilation: Mask ventilation without difficulty Laryngoscope Size: Mac and 3 Grade View: Grade I Tube type: Oral Tube size: 7.0 mm Number of attempts: 1 Airway Equipment and Method: Oral airway Placement Confirmation: ETT inserted through vocal cords under direct vision,  positive ETCO2 and breath sounds checked- equal and bilateral Secured at: 20 cm Tube secured with: Tape Dental Injury: Teeth and Oropharynx as per pre-operative assessment

## 2015-08-09 NOTE — Progress Notes (Signed)
Subjective: Feeling a little better  Objective: Vital signs in last 24 hours: Temp:  [97.7 F (36.5 C)-98.9 F (37.2 C)] 98.9 F (37.2 C) (01/30 0541) Pulse Rate:  [67-79] 67 (01/30 0541) Resp:  [16] 16 (01/30 0541) BP: (125-143)/(56-57) 129/57 mmHg (01/30 0541) SpO2:  [90 %-94 %] 94 % (01/30 0541) Last BM Date: 08/07/15  Intake/Output from previous day: 01/29 0701 - 01/30 0700 In: 1240 [P.O.:240; I.V.:600; IV Piggyback:400] Out: 1350 [Urine:1350] Intake/Output this shift:    Resp: clear to auscultation bilaterally Cardio: regular rate and rhythm GI: soft, mild tenderness in the RUQ and epigastric region  Lab Results:   Recent Labs  08/08/15 0451 08/09/15 0521  WBC 3.9* 4.4  HGB 11.8* 11.4*  HCT 38.4 36.9  PLT 223 198   BMET  Recent Labs  08/08/15 0451 08/09/15 0521  NA 140 141  K 3.9 3.9  CL 103 103  CO2 30 30  GLUCOSE 104* 112*  BUN 20 13  CREATININE 0.99 0.99  CALCIUM 9.1 8.8*   PT/INR No results for input(s): LABPROT, INR in the last 72 hours. ABG No results for input(s): PHART, HCO3 in the last 72 hours.  Invalid input(s): PCO2, PO2  Studies/Results: Dg Chest 2 View  08/08/2015  CLINICAL DATA:  Preop.  COPD.  Planned cholecystectomy. EXAM: CHEST  2 VIEW COMPARISON:  06/23/2015 FINDINGS: Patchy opacity in the right mid lung, new since prior study. Cannot exclude developing infiltrate. Right Port-A-Cath remains in place, unchanged. Left lung is clear. Heart is normal size. There is hyperinflation of the lungs compatible with COPD. IMPRESSION: Vague airspace opacity in the right mid lung. Cannot exclude developing infiltrate. COPD. Electronically Signed   By: Rolm Baptise M.D.   On: 08/08/2015 09:54   Ct Chest W Contrast  08/08/2015  CLINICAL DATA:  75 year old female with possible right lung opacity identified on preoperative chest radiograph. Former smoker with COPD and ovarian cancer. EXAM: CT CHEST WITH CONTRAST TECHNIQUE: Multidetector CT  imaging of the chest was performed during intravenous contrast administration. CONTRAST:  32mL OMNIPAQUE IOHEXOL 300 MG/ML  SOLN COMPARISON:  08/08/2015 and prior chest radiographs. 05/31/2015 and prior chest CTs. FINDINGS: Mediastinum/Nodes: Coronary artery and aortic atherosclerotic calcifications are noted without evidence of thoracic aortic aneurysm. Heart size is upper limits of normal. A right sided Port-A-Cath is present with tip in the lower SVC. No enlarged lymph nodes or pericardial effusion. Lungs/Pleura: Mild thickening/atelectasis along the posterior aspect of the right major fissure accounts for the chest radiograph abnormality. There is minimal thickening along the posterior left major fissure. Mild bibasilar atelectasis, right greater than left noted. Mild centrilobular emphysema is present. There is no evidence of airspace disease, pulmonary mass, consolidation, endobronchial/endotracheal lesion or pleural effusion. Upper abdomen: Cholelithiasis identified. Musculoskeletal: No acute or suspicious abnormalities. IMPRESSION: Mild thickening/ atelectasis along the posterior aspect of the right major fissure accounting for the radiographic abnormality. Emphysema and mild bibasilar atelectasis. Coronary artery disease and aortic atherosclerosis. Cholelithiasis. Electronically Signed   By: Margarette Canada M.D.   On: 08/08/2015 18:58   Ct Abdomen Pelvis W Contrast  08/07/2015  CLINICAL DATA:  Sudden onset severe abdominal pain about an hour ago. History of ovarian cancer. Nausea. History of gallstones. EXAM: CT ABDOMEN AND PELVIS WITH CONTRAST TECHNIQUE: Multidetector CT imaging of the abdomen and pelvis was performed using the standard protocol following bolus administration of intravenous contrast. CONTRAST:  152mL OMNIPAQUE IOHEXOL 300 MG/ML  SOLN COMPARISON:  05/31/2015 FINDINGS: Mild dependent changes in the lung  bases. 4 mm pleural-based nodule in the right lower lung posteriorly. This was not present  on the previous study. Given the history of neoplasm, six-month follow-up is suggested. Cholelithiasis with large stones in the gallbladder. Increased density of the bowel which may indicate vicarious contrast excretion or gallbladder sludge. Mild pericholecystic edema. Changes are nonspecific but could indicate cholecystitis. No bile duct dilatation. The liver, spleen, pancreas, right kidney, inferior vena cava, and retroperitoneal lymph nodes are unremarkable. Left kidney is absent, presumably postoperative. 1.3 cm diameter left adrenal gland nodule is unchanged since previous study. Stomach, small bowel, and colon are not abnormally distended. Scattered diverticula in the colon. There is a midline anterior abdominal wall hernia containing a portion of the transverse colon but without evidence of proximal obstruction. The hernia has developed since the previous study. No free air or free fluid in the abdomen. Pelvis: The appendix is not identified. Diverticulosis of the sigmoid colon without evidence of diverticulitis. Surgical absence of the uterus. No pelvic mass or lymphadenopathy. Bladder wall is not thickened. No free or loculated pelvic fluid collections. Mild degenerative changes in the spine and hips. No destructive bone lesions. IMPRESSION: Cholelithiasis with increased density in the bile and mild pericholecystic edema. Changes are nonspecific but could indicate acute cholecystitis. No significant bile duct dilatation. Midline anterior abdominal wall hernia containing transverse colon without evidence of obstruction. Diverticulosis of the colon without evidence of diverticulitis. 4 mm nodule in the right lung base may be new since previous study. Six-month follow-up recommended. Electronically Signed   By: Lucienne Capers M.D.   On: 08/07/2015 20:38    Anti-infectives: Anti-infectives    Start     Dose/Rate Route Frequency Ordered Stop   08/08/15 0800  ciprofloxacin (CIPRO) IVPB 400 mg     400  mg 200 mL/hr over 60 Minutes Intravenous Every 12 hours 08/08/15 0709        Assessment/Plan: s/p Procedure(s): LAPAROSCOPIC CHOLECYSTECTOMY WITH INTRAOPERATIVE CHOLANGIOGRAM (N/A) Plan for surgery today to remove gallbladder. Risks and benefits of surgery as well as some of the technical aspects discussed with the patient including the need for open surgery, risk of injury to the bowel, and possible recurrence of the cancer and she understands and wishes to proceed  LOS: 2 days    TOTH III,Lekeisha Arenas S 08/09/2015

## 2015-08-09 NOTE — Transfer of Care (Signed)
Immediate Anesthesia Transfer of Care Note  Patient: Lynn Ramirez  Procedure(s) Performed: Procedure(s): Attempted LAPAROSCOPIC coverted  open CHOLECYSTECTOMY WITH INTRAOPERATIVE CHOLANGIOGRAM (N/A)  Patient Location: PACU  Anesthesia Type:General  Level of Consciousness: Patient easily awoken, sedated, comfortable, cooperative, following commands, responds to stimulation.   Airway & Oxygen Therapy: Patient spontaneously breathing, ventilating well, oxygen via simple oxygen mask.  Post-op Assessment: Report given to PACU RN, vital signs reviewed and stable, moving all extremities.   Post vital signs: Reviewed and stable.  Complications: No apparent anesthesia complications

## 2015-08-09 NOTE — Progress Notes (Signed)
[facility] PROGRESS NOTE  Lynn Ramirez [id number] DOB: May 05, 1941 DOA: 08/07/2015  PCP: Antony Blackbird, MD  Brief HPI: 75 year old Caucasian female with a past medical history of ovarian cancer, hypertension, COPD, presented with the severe abdominal pain. Evaluation in the emergency department revealed findings suspicious for cholecystitis. She was hospitalized for further management.  Past medical history:  Past Medical History  Diagnosis Date  . H/O hydronephrosis   . Hypertension   . Emphysema   . COPD (chronic obstructive pulmonary disease) ([place])   . Cancer ([place])     ovarian  . Eczema     hands  . GERD (gastroesophageal reflux disease)   . History of transfusion     age 75  . Difficulty sleeping     Consultants: Gen. surgery  Procedures: None  Antibiotics: Ciprofloxacin  Subjective: Patient feels better this morning. Pain in the abdomen has improved. Denies any nausea or vomiting. Shortness of breath.   Objective: Vital Signs  Filed Vitals:   08/08/15 0829 08/08/15 1415 08/08/15 2224 08/09/15 0541  BP:  143/57 125/56 129/57  Pulse:  79 73 67  Temp:  97.7 F (36.5 C) 98.4 F (36.9 C) 98.9 F (37.2 C)  TempSrc:  Oral Oral Oral  Resp:  16 16 16   Height:      Weight:      SpO2: 90% 91% 94% 94%    Intake/Output Summary (Last 24 hours) at 08/09/15 0749 Last data filed at 08/09/15 0541  Gross per 24 hour  Intake   1240 ml  Output   1350 ml  Net   -110 ml   Filed Weights   08/07/15 2243  Weight: 79.833 kg (176 lb)    General appearance: alert, cooperative, appears stated age and no distress Resp: clear to auscultation bilaterally Cardio: regular rate and rhythm, S1, S2 normal, no murmur, click, rub or gallop GI: Abdomen is obese. Soft. Less tender in the upper abdomen. without any rebound, rigidity or guarding. No masses or organomegaly. Bowel sounds are present. Extremities: extremities normal, atraumatic, no cyanosis or  edema Neurologic: Awake and alert. Oriented 3. No focal neurological deficits.  Lab Results:  Basic Metabolic Panel:  Recent Labs Lab 08/07/15 1830 08/08/15 0451 08/09/15 0521  NA 138 140 141  K 4.1 3.9 3.9  CL 97* 103 103  CO2 33* 30 30  GLUCOSE 115* 104* 112*  BUN 24* 20 13  CREATININE 1.00 0.99 0.99  CALCIUM 10.0 9.1 8.8*  MG  --  1.8  --   PHOS  --  3.5  --    Liver Function Tests:  Recent Labs Lab 08/07/15 1830 08/08/15 0451 08/09/15 0521  AST 75* 383* 126*  ALT 42 292* 223*  ALKPHOS 110 126 122  BILITOT 1.2 1.1 0.6  PROT 7.8 6.3* 5.9*  ALBUMIN 4.0 3.3* 3.1*    Recent Labs Lab 08/07/15 1830  LIPASE 33   CBC:  Recent Labs Lab 08/07/15 1830 08/08/15 0451 08/09/15 0521  WBC 8.0 3.9* 4.4  NEUTROABS  --  2.6  --   HGB 13.1 11.8* 11.4*  HCT 41.5 38.4 36.9  MCV 94.5 95.8 96.1  PLT 254 223 198     Studies/Results: Dg Chest 2 View  08/08/2015  CLINICAL DATA:  Preop.  COPD.  Planned cholecystectomy. EXAM: CHEST  2 VIEW COMPARISON:  06/23/2015 FINDINGS: Patchy opacity in the right mid lung, new since prior study. Cannot exclude developing infiltrate. Right Port-A-Cath remains in place, unchanged. Left lung is clear.  Heart is normal size. There is hyperinflation of the lungs compatible with COPD. IMPRESSION: Vague airspace opacity in the right mid lung. Cannot exclude developing infiltrate. COPD. Electronically Signed   By: Rolm Baptise M.D.   On: 08/08/2015 09:54   Ct Chest W Contrast  08/08/2015  CLINICAL DATA:  75 year old female with possible right lung opacity identified on preoperative chest radiograph. Former smoker with COPD and ovarian cancer. EXAM: CT CHEST WITH CONTRAST TECHNIQUE: Multidetector CT imaging of the chest was performed during intravenous contrast administration. CONTRAST:  73mL OMNIPAQUE IOHEXOL 300 MG/ML  SOLN COMPARISON:  08/08/2015 and prior chest radiographs. 05/31/2015 and prior chest CTs. FINDINGS: Mediastinum/Nodes: Coronary  artery and aortic atherosclerotic calcifications are noted without evidence of thoracic aortic aneurysm. Heart size is upper limits of normal. A right sided Port-A-Cath is present with tip in the lower SVC. No enlarged lymph nodes or pericardial effusion. Lungs/Pleura: Mild thickening/atelectasis along the posterior aspect of the right major fissure accounts for the chest radiograph abnormality. There is minimal thickening along the posterior left major fissure. Mild bibasilar atelectasis, right greater than left noted. Mild centrilobular emphysema is present. There is no evidence of airspace disease, pulmonary mass, consolidation, endobronchial/endotracheal lesion or pleural effusion. Upper abdomen: Cholelithiasis identified. Musculoskeletal: No acute or suspicious abnormalities. IMPRESSION: Mild thickening/ atelectasis along the posterior aspect of the right major fissure accounting for the radiographic abnormality. Emphysema and mild bibasilar atelectasis. Coronary artery disease and aortic atherosclerosis. Cholelithiasis. Electronically Signed   By: Margarette Canada M.D.   On: 08/08/2015 18:58   Ct Abdomen Pelvis W Contrast  08/07/2015  CLINICAL DATA:  Sudden onset severe abdominal pain about an hour ago. History of ovarian cancer. Nausea. History of gallstones. EXAM: CT ABDOMEN AND PELVIS WITH CONTRAST TECHNIQUE: Multidetector CT imaging of the abdomen and pelvis was performed using the standard protocol following bolus administration of intravenous contrast. CONTRAST:  136mL OMNIPAQUE IOHEXOL 300 MG/ML  SOLN COMPARISON:  05/31/2015 FINDINGS: Mild dependent changes in the lung bases. 4 mm pleural-based nodule in the right lower lung posteriorly. This was not present on the previous study. Given the history of neoplasm, six-month follow-up is suggested. Cholelithiasis with large stones in the gallbladder. Increased density of the bowel which may indicate vicarious contrast excretion or gallbladder sludge. Mild  pericholecystic edema. Changes are nonspecific but could indicate cholecystitis. No bile duct dilatation. The liver, spleen, pancreas, right kidney, inferior vena cava, and retroperitoneal lymph nodes are unremarkable. Left kidney is absent, presumably postoperative. 1.3 cm diameter left adrenal gland nodule is unchanged since previous study. Stomach, small bowel, and colon are not abnormally distended. Scattered diverticula in the colon. There is a midline anterior abdominal wall hernia containing a portion of the transverse colon but without evidence of proximal obstruction. The hernia has developed since the previous study. No free air or free fluid in the abdomen. Pelvis: The appendix is not identified. Diverticulosis of the sigmoid colon without evidence of diverticulitis. Surgical absence of the uterus. No pelvic mass or lymphadenopathy. Bladder wall is not thickened. No free or loculated pelvic fluid collections. Mild degenerative changes in the spine and hips. No destructive bone lesions. IMPRESSION: Cholelithiasis with increased density in the bile and mild pericholecystic edema. Changes are nonspecific but could indicate acute cholecystitis. No significant bile duct dilatation. Midline anterior abdominal wall hernia containing transverse colon without evidence of obstruction. Diverticulosis of the colon without evidence of diverticulitis. 4 mm nodule in the right lung base may be new since previous study. Six-month follow-up  recommended. Electronically Signed   By: Lucienne Capers M.D.   On: 08/07/2015 20:38    Medications:  Scheduled: . antiseptic oral rinse  7 mL Mouth Rinse BID  . budesonide-formoterol  2 puff Inhalation BID  . ciprofloxacin  400 mg Intravenous Q12H  . tiotropium  18 mcg Inhalation Daily   Continuous:   [id number], fluocinonide cream, morphine injection, ondansetron **OR** ondansetron (ZOFRAN) IV, sodium chloride flush  Assessment/Plan:  Principal Problem:   Acute  cholecystitis Active Problems:   COPD  GOLD III   Ovarian cancer ([place])   Hyperlipidemia   Solitary pulmonary nodule on lung CT   Acute calculous cholecystitis    Acute cholecystitis Patient feels better. LFTs are improved. Plan is for surgical intervention today. Continue ciprofloxacin. She had a negative stress test back in October. EKG does not show any concerning changes.  History of COPD, gold 3 Stable. Continue with her home medications including her inhaled steroid as well as spiriva. X-ray suggested an opacity in the right mid lung. CT scan was done which shows evidence for atelectasis. Incentive spirometry. Patient will need aggressive pulmonary toilet postoperatively.  Abnormal chest x-ray/Solitary pulmonary nodule The solitary nodule was detected on the lung images seen on CT scan of abdomen. Histex shows an opacity in the right midlung. This prompted CT scan of the chest, which does not show any mass. Changes most likely due to atelectasis. She will need follow-up for her pulmonary nodule. Patient does have a history of ovarian cancer. She is already followed by oncology. They will have to weigh in, but this can be pursued as an outpatient. She is also followed by pulmonology.  History of ovarian cancer Not on chemotherapy currently. Followed by oncology closely. They're monitoring her tumor markers. Oncologist will be notified via epic.  History of hyperlipidemia Holding her statin due to abnormal LFTs  DVT Prophylaxis: SCDs for now    Code Status: Full code  Family Communication: Discussed with the patient and her husband  Disposition Plan: Plan is for surgery today.    LOS: 2 days   [facility] Pager [phone] 08/09/2015, 7:49 AM  If 7PM-7AM, please contact night-coverage at [email or web address], password [facility]

## 2015-08-09 NOTE — Anesthesia Postprocedure Evaluation (Signed)
Anesthesia Post Note  Patient: Alara Buckley  Procedure(s) Performed: Procedure(s) (LRB): Attempted LAPAROSCOPIC coverted  open CHOLECYSTECTOMY WITH INTRAOPERATIVE CHOLANGIOGRAM (N/A)  Patient location during evaluation: PACU Anesthesia Type: General Level of consciousness: awake and alert Pain management: pain level controlled Vital Signs Assessment: post-procedure vital signs reviewed and stable Respiratory status: spontaneous breathing, nonlabored ventilation, respiratory function stable and patient connected to nasal cannula oxygen Cardiovascular status: blood pressure returned to baseline and stable Postop Assessment: no signs of nausea or vomiting Anesthetic complications: no    Last Vitals:  Filed Vitals:   08/09/15 1500 08/09/15 1607  BP: 162/71 148/63  Pulse: 88 95  Temp: 36.7 C 37.1 C  Resp: 20 18    Last Pain:  Filed Vitals:   08/09/15 1608  PainSc: 9                  Tynetta Bachmann JENNETTE

## 2015-08-09 NOTE — Anesthesia Preprocedure Evaluation (Signed)
Anesthesia Evaluation  Patient identified by MRN, date of birth, ID band Patient awake    Reviewed: Allergy & Precautions, NPO status , Patient's Chart, lab work & pertinent test results  History of Anesthesia Complications Negative for: history of anesthetic complications  Airway Mallampati: II  TM Distance: >3 FB Neck ROM: Full    Dental no notable dental hx. (+) Dental Advisory Given   Pulmonary COPD,  COPD inhaler and oxygen dependent, former smoker,    Pulmonary exam normal breath sounds clear to auscultation       Cardiovascular hypertension, Pt. on medications + CAD  Normal cardiovascular exam Rhythm:Regular Rate:Normal     Neuro/Psych negative neurological ROS  negative psych ROS   GI/Hepatic Neg liver ROS, GERD  ,  Endo/Other  negative endocrine ROS  Renal/GU negative Renal ROS   Ovarian Ca  negative genitourinary   Musculoskeletal negative musculoskeletal ROS (+)   Abdominal   Peds negative pediatric ROS (+)  Hematology negative hematology ROS (+)   Anesthesia Other Findings   Reproductive/Obstetrics negative OB ROS                             Anesthesia Physical Anesthesia Plan  ASA: III  Anesthesia Plan: General   Post-op Pain Management:    Induction: Intravenous  Airway Management Planned: Oral ETT  Additional Equipment:   Intra-op Plan:   Post-operative Plan: Extubation in OR  Informed Consent: I have reviewed the patients History and Physical, chart, labs and discussed the procedure including the risks, benefits and alternatives for the proposed anesthesia with the patient or authorized representative who has indicated his/her understanding and acceptance.   Dental advisory given  Plan Discussed with: CRNA  Anesthesia Plan Comments:         Anesthesia Quick Evaluation

## 2015-08-10 LAB — CBC
HCT: 38.3 % (ref 36.0–46.0)
Hemoglobin: 11.7 g/dL — ABNORMAL LOW (ref 12.0–15.0)
MCH: 29.5 pg (ref 26.0–34.0)
MCHC: 30.5 g/dL (ref 30.0–36.0)
MCV: 96.5 fL (ref 78.0–100.0)
PLATELETS: 214 10*3/uL (ref 150–400)
RBC: 3.97 MIL/uL (ref 3.87–5.11)
RDW: 16.4 % — ABNORMAL HIGH (ref 11.5–15.5)
WBC: 5.8 10*3/uL (ref 4.0–10.5)

## 2015-08-10 LAB — COMPREHENSIVE METABOLIC PANEL
ALBUMIN: 3.4 g/dL — AB (ref 3.5–5.0)
ALT: 520 U/L — AB (ref 14–54)
AST: 424 U/L — AB (ref 15–41)
Alkaline Phosphatase: 189 U/L — ABNORMAL HIGH (ref 38–126)
Anion gap: 8 (ref 5–15)
BUN: 13 mg/dL (ref 6–20)
CHLORIDE: 104 mmol/L (ref 101–111)
CO2: 31 mmol/L (ref 22–32)
CREATININE: 1.03 mg/dL — AB (ref 0.44–1.00)
Calcium: 9.3 mg/dL (ref 8.9–10.3)
GFR calc Af Amer: >60 mL/min (ref >60)
GFR calc non Af Amer: 53 mL/min — ABNORMAL LOW (ref >60)
GLUCOSE: 135 mg/dL — AB (ref 65–99)
POTASSIUM: 4.2 mmol/L (ref 3.5–5.1)
SODIUM: 143 mmol/L (ref 135–145)
Total Bilirubin: 1.4 mg/dL — ABNORMAL HIGH (ref 0.3–1.2)
Total Protein: 6.6 g/dL (ref 6.5–8.1)

## 2015-08-10 LAB — LIPASE, BLOOD: Lipase: 18 U/L (ref 11–51)

## 2015-08-10 MED ORDER — HEPARIN SODIUM (PORCINE) 5000 UNIT/ML IJ SOLN
5000.0000 [IU] | Freq: Three times a day (TID) | INTRAMUSCULAR | Status: DC
  Administered 2015-08-10 – 2015-08-11 (×4): 5000 [IU] via SUBCUTANEOUS
  Filled 2015-08-10 (×6): qty 1

## 2015-08-10 MED ORDER — SODIUM CHLORIDE 0.9 % IV SOLN
INTRAVENOUS | Status: AC
  Administered 2015-08-10 – 2015-08-11 (×2): via INTRAVENOUS

## 2015-08-10 MED ORDER — MORPHINE SULFATE (PF) 2 MG/ML IV SOLN
1.0000 mg | INTRAVENOUS | Status: DC | PRN
  Administered 2015-08-11: 2 mg via INTRAVENOUS
  Filled 2015-08-10: qty 1

## 2015-08-10 MED ORDER — HYDROCODONE-ACETAMINOPHEN 5-325 MG PO TABS
1.0000 | ORAL_TABLET | ORAL | Status: DC | PRN
  Administered 2015-08-10 – 2015-08-11 (×4): 2 via ORAL
  Administered 2015-08-11: 1 via ORAL
  Filled 2015-08-10: qty 2
  Filled 2015-08-10: qty 1
  Filled 2015-08-10 (×4): qty 2

## 2015-08-10 NOTE — Progress Notes (Signed)
[facility] Progress Note  1 Day Post-Op  Subjective: Pt's pain isn't quite well controlled yet.  Wants to try orals.  Ambulating well OOB and through the hall.  Husband at bedside.  Urinating well, some flatus, no BM.  Feels bloated.  Tolerating clears, looking forward to fulls.  Objective: Vital signs in last 24 hours: Temp:  [97.8 F (36.6 C)-98.7 F (37.1 C)] 98.2 F (36.8 C) (01/31 0532) Pulse Rate:  [68-95] 71 (01/31 0532) Resp:  [10-22] 20 (01/31 0532) BP: (126-162)/(56-80) 152/68 mmHg (01/31 0532) SpO2:  [90 %-100 %] 96 % (01/31 0532) Last BM Date: 08/07/15  Intake/Output from previous day: 01/30 0701 - 01/31 0700 In: 3256.7 [P.O.:60; I.V.:2796.7; IV Piggyback:400] Out: 1800 [Urine:1800] Intake/Output this shift:    PE: Gen:  Alert, NAD, pleasant Abd: Soft, mild distension, tender to palp over incisions, +BS, no HSM, incisions C/D/I   Lab Results:   Recent Labs  08/09/15 0521 08/10/15 0508  WBC 4.4 5.8  HGB 11.4* 11.7*  HCT 36.9 38.3  PLT 198 214   BMET  Recent Labs  08/09/15 0521 08/10/15 0508  NA 141 143  K 3.9 4.2  CL 103 104  CO2 30 31  GLUCOSE 112* 135*  BUN 13 13  CREATININE 0.99 1.03*  CALCIUM 8.8* 9.3   PT/INR No results for input(s): LABPROT, INR in the last 72 hours. CMP     Component Value Date/Time   NA 143 08/10/2015 0508   NA 142 03/10/2015 0847   K 4.2 08/10/2015 0508   K 4.0 03/10/2015 0847   CL 104 08/10/2015 0508   CO2 31 08/10/2015 0508   CO2 29 03/10/2015 0847   GLUCOSE 135* 08/10/2015 0508   GLUCOSE 125 03/10/2015 0847   BUN 13 08/10/2015 0508   BUN 24.1 03/10/2015 0847   CREATININE 1.03* 08/10/2015 0508   CREATININE 0.91 04/30/2015 1058   CREATININE 1.0 03/10/2015 0847   CALCIUM 9.3 08/10/2015 0508   CALCIUM 10.0 03/10/2015 0847   PROT 6.6 08/10/2015 0508   PROT 6.8 03/10/2015 0847   ALBUMIN 3.4* 08/10/2015 0508   ALBUMIN 3.4* 03/10/2015 0847   AST 424* 08/10/2015 0508   AST 13 03/10/2015  0847   ALT 520* 08/10/2015 0508   ALT 14 03/10/2015 0847   ALKPHOS 189* 08/10/2015 0508   ALKPHOS 91 03/10/2015 0847   BILITOT 1.4* 08/10/2015 0508   BILITOT 0.30 03/10/2015 0847   GFRNONAA 53* 08/10/2015 0508   GFRAA >60 08/10/2015 0508   Lipase     Component Value Date/Time   LIPASE 18 08/10/2015 0508       Studies/Results: Dg Cholangiogram Operative  08/09/2015  CLINICAL DATA:  75 year old female with cholelithiasis and increasing abdominal pain. EXAM: INTRAOPERATIVE CHOLANGIOGRAM TECHNIQUE: Cholangiographic images from the C-arm fluoroscopic device were submitted for interpretation post-operatively. Please see the procedural report for the amount of contrast and the fluoroscopy time utilized. COMPARISON:  CT abdomen/ pelvis 08/07/2015 FINDINGS: Two cine loops obtained during intraoperative cholangiogram at the time of open cholecystectomy demonstrate cannulation of the cystic duct remanent and opacification of the biliary tree. There is mild dilatation of the common hepatic and common bile ducts. Contrast material is slow to fill the distal common bile duct and is not visualized passing through the ampulla. IMPRESSION: 1. Mild dilatation of the common hepatic and common bile ducts. 2. Slow filling of the distal common bile duct. Contrast is not visualized passing through the ampulla and into the duodenum. Distal common duct choledocholithiasis, stricture,  or less likely mass are not excluded radiographically. Electronically Signed   By: Jacqulynn Cadet M.D.   On: 08/09/2015 13:35   Ct Chest W Contrast  08/08/2015  CLINICAL DATA:  75 year old female with possible right lung opacity identified on preoperative chest radiograph. Former smoker with COPD and ovarian cancer. EXAM: CT CHEST WITH CONTRAST TECHNIQUE: Multidetector CT imaging of the chest was performed during intravenous contrast administration. CONTRAST:  61mL OMNIPAQUE IOHEXOL 300 MG/ML  SOLN COMPARISON:  08/08/2015 and prior  chest radiographs. 05/31/2015 and prior chest CTs. FINDINGS: Mediastinum/Nodes: Coronary artery and aortic atherosclerotic calcifications are noted without evidence of thoracic aortic aneurysm. Heart size is upper limits of normal. A right sided Port-A-Cath is present with tip in the lower SVC. No enlarged lymph nodes or pericardial effusion. Lungs/Pleura: Mild thickening/atelectasis along the posterior aspect of the right major fissure accounts for the chest radiograph abnormality. There is minimal thickening along the posterior left major fissure. Mild bibasilar atelectasis, right greater than left noted. Mild centrilobular emphysema is present. There is no evidence of airspace disease, pulmonary mass, consolidation, endobronchial/endotracheal lesion or pleural effusion. Upper abdomen: Cholelithiasis identified. Musculoskeletal: No acute or suspicious abnormalities. IMPRESSION: Mild thickening/ atelectasis along the posterior aspect of the right major fissure accounting for the radiographic abnormality. Emphysema and mild bibasilar atelectasis. Coronary artery disease and aortic atherosclerosis. Cholelithiasis. Electronically Signed   By: Margarette Canada M.D.   On: 08/08/2015 18:58    Anti-infectives: Anti-infectives    Start     Dose/Rate Route Frequency Ordered Stop   08/08/15 0800  ciprofloxacin (CIPRO) IVPB 400 mg     400 mg 200 mL/hr over 60 Minutes Intravenous Every 12 hours 08/08/15 0709         Assessment/Plan Choledocholithiasis POD #1 s/p attempted laparoscopic converted to open cholecystectomy with IOC - Dr. Marlou Starks -Advance to full liquids.   -IVF, pain control, antiemetics -Ambulate and IS -SCD's and heparin -Recheck labs in am, since IOC did not show emptying need to trend labs to see if there may be a retained stone. -  Metastatic adenocarcinoma - High grade carcinoma of ovarian, fallopian tube COPD/Malignant right pleural effusion    LOS: 3 days    Nat Christen 08/10/2015,  10:05 AM Pager: [phone]

## 2015-08-10 NOTE — Progress Notes (Signed)
[facility] PROGRESS NOTE  Lynn Ramirez [id number] DOB: 05-24-1941 DOA: 08/07/2015  PCP: Antony Blackbird, MD  Brief HPI: 75 year old Caucasian female with a past medical history of ovarian cancer, hypertension, COPD, presented with the severe abdominal pain. Evaluation in the emergency department revealed findings suspicious for cholecystitis. She was hospitalized for further management.  Past medical history:  Past Medical History  Diagnosis Date  . H/O hydronephrosis   . Hypertension   . Emphysema   . COPD (chronic obstructive pulmonary disease) ([place])   . Cancer ([place])     ovarian  . Eczema     hands  . GERD (gastroesophageal reflux disease)   . History of transfusion     age 73  . Difficulty sleeping     Consultants: Gen. surgery  Procedures: None  Antibiotics: Ciprofloxacin  Subjective: Patient intends to have pain in the upper abdomen. Also complains of pain in the back area. Denies any nausea, vomiting. No shortness of breath.   Objective: Vital Signs  Filed Vitals:   08/09/15 1800 08/09/15 2158 08/10/15 0144 08/10/15 0532  BP: 150/63 136/56 139/57 152/68  Pulse: 77 81 68 71  Temp: 98.7 F (37.1 C) 98.4 F (36.9 C) 97.8 F (36.6 C) 98.2 F (36.8 C)  TempSrc: Oral Oral Oral Oral  Resp: 18 16 16 20   Height:      Weight:      SpO2: 96% 94% 95% 96%    Intake/Output Summary (Last 24 hours) at 08/10/15 0759 Last data filed at 08/10/15 0532  Gross per 24 hour  Intake 3256.67 ml  Output   1800 ml  Net 1456.67 ml   Filed Weights   08/07/15 2243  Weight: 79.833 kg (176 lb)    General appearance: alert, cooperative, appears stated age and no distress Resp: clear to auscultation bilaterally Cardio: regular rate and rhythm, S1, S2 normal, no murmur, click, rub or gallop GI: Abdomen is obese. Soft. Continues to be tender in the upper abdomen, especially on the right upper quadrant. No tenderness in the epigastric area. Bowel sounds are present.  No masses or organomegaly.  Extremities: extremities normal, atraumatic, no cyanosis or edema Neurologic: Awake and alert. Oriented 3. No focal neurological deficits.  Lab Results:  Basic Metabolic Panel:  Recent Labs Lab 08/07/15 1830 08/08/15 0451 08/09/15 0521 08/10/15 0508  NA 138 140 141 143  K 4.1 3.9 3.9 4.2  CL 97* 103 103 104  CO2 33* 30 30 31   GLUCOSE 115* 104* 112* 135*  BUN 24* 20 13 13   CREATININE 1.00 0.99 0.99 1.03*  CALCIUM 10.0 9.1 8.8* 9.3  MG  --  1.8  --   --   PHOS  --  3.5  --   --    Liver Function Tests:  Recent Labs Lab 08/07/15 1830 08/08/15 0451 08/09/15 0521 08/10/15 0508  AST 75* 383* 126* 424*  ALT 42 292* 223* 520*  ALKPHOS 110 126 122 189*  BILITOT 1.2 1.1 0.6 1.4*  PROT 7.8 6.3* 5.9* 6.6  ALBUMIN 4.0 3.3* 3.1* 3.4*    Recent Labs Lab 08/07/15 1830  LIPASE 33   CBC:  Recent Labs Lab 08/07/15 1830 08/08/15 0451 08/09/15 0521 08/10/15 0508  WBC 8.0 3.9* 4.4 5.8  NEUTROABS  --  2.6  --   --   HGB 13.1 11.8* 11.4* 11.7*  HCT 41.5 38.4 36.9 38.3  MCV 94.5 95.8 96.1 96.5  PLT 254 223 198 214     Studies/Results: Dg Chest  2 View  08/08/2015  CLINICAL DATA:  Preop.  COPD.  Planned cholecystectomy. EXAM: CHEST  2 VIEW COMPARISON:  06/23/2015 FINDINGS: Patchy opacity in the right mid lung, new since prior study. Cannot exclude developing infiltrate. Right Port-A-Cath remains in place, unchanged. Left lung is clear. Heart is normal size. There is hyperinflation of the lungs compatible with COPD. IMPRESSION: Vague airspace opacity in the right mid lung. Cannot exclude developing infiltrate. COPD. Electronically Signed   By: Rolm Baptise M.D.   On: 08/08/2015 09:54   Dg Cholangiogram Operative  08/09/2015  CLINICAL DATA:  75 year old female with cholelithiasis and increasing abdominal pain. EXAM: INTRAOPERATIVE CHOLANGIOGRAM TECHNIQUE: Cholangiographic images from the C-arm fluoroscopic device were submitted for interpretation  post-operatively. Please see the procedural report for the amount of contrast and the fluoroscopy time utilized. COMPARISON:  CT abdomen/ pelvis 08/07/2015 FINDINGS: Two cine loops obtained during intraoperative cholangiogram at the time of open cholecystectomy demonstrate cannulation of the cystic duct remanent and opacification of the biliary tree. There is mild dilatation of the common hepatic and common bile ducts. Contrast material is slow to fill the distal common bile duct and is not visualized passing through the ampulla. IMPRESSION: 1. Mild dilatation of the common hepatic and common bile ducts. 2. Slow filling of the distal common bile duct. Contrast is not visualized passing through the ampulla and into the duodenum. Distal common duct choledocholithiasis, stricture, or less likely mass are not excluded radiographically. Electronically Signed   By: Jacqulynn Cadet M.D.   On: 08/09/2015 13:35   Ct Chest W Contrast  08/08/2015  CLINICAL DATA:  75 year old female with possible right lung opacity identified on preoperative chest radiograph. Former smoker with COPD and ovarian cancer. EXAM: CT CHEST WITH CONTRAST TECHNIQUE: Multidetector CT imaging of the chest was performed during intravenous contrast administration. CONTRAST:  45mL OMNIPAQUE IOHEXOL 300 MG/ML  SOLN COMPARISON:  08/08/2015 and prior chest radiographs. 05/31/2015 and prior chest CTs. FINDINGS: Mediastinum/Nodes: Coronary artery and aortic atherosclerotic calcifications are noted without evidence of thoracic aortic aneurysm. Heart size is upper limits of normal. A right sided Port-A-Cath is present with tip in the lower SVC. No enlarged lymph nodes or pericardial effusion. Lungs/Pleura: Mild thickening/atelectasis along the posterior aspect of the right major fissure accounts for the chest radiograph abnormality. There is minimal thickening along the posterior left major fissure. Mild bibasilar atelectasis, right greater than left noted.  Mild centrilobular emphysema is present. There is no evidence of airspace disease, pulmonary mass, consolidation, endobronchial/endotracheal lesion or pleural effusion. Upper abdomen: Cholelithiasis identified. Musculoskeletal: No acute or suspicious abnormalities. IMPRESSION: Mild thickening/ atelectasis along the posterior aspect of the right major fissure accounting for the radiographic abnormality. Emphysema and mild bibasilar atelectasis. Coronary artery disease and aortic atherosclerosis. Cholelithiasis. Electronically Signed   By: Margarette Canada M.D.   On: 08/08/2015 18:58    Medications:  Scheduled: . antiseptic oral rinse  7 mL Mouth Rinse BID  . budesonide-formoterol  2 puff Inhalation BID  . ciprofloxacin  400 mg Intravenous Q12H  . tiotropium  18 mcg Inhalation Daily   Continuous:   [id number], fluocinonide cream, morphine injection, ondansetron (ZOFRAN) IV, ondansetron, sodium chloride flush  Assessment/Plan:  Principal Problem:   Acute cholecystitis Active Problems:   COPD  GOLD III   Ovarian cancer ([place])   Hyperlipidemia   Solitary pulmonary nodule on lung CT   Acute calculous cholecystitis    Acute cholecystitis Patient is status post cholecystectomy. However, her LFTs including bilirubin noted to be  higher today. Patient complains of back pain which could be due to position. But we will check a lipase level. Operative report reviewed. Cholangiogram did not show any filling defects. However, contrast did not empty out into the duodenum. The reason for this is not entirely clear. Repeat labs tomorrow morning. General surgery is following. On Cipro.  History of COPD, gold 3 Stable. Continue with her home medications including her inhaled steroid as well as spiriva. X-ray suggested an opacity in the right mid lung. CT scan was done which shows evidence for atelectasis. Incentive spirometry. Patient will need aggressive pulmonary toilet postoperatively.  Abnormal chest  x-ray/Solitary pulmonary nodule The solitary nodule was detected on the lung images seen on CT scan of abdomen. Histex shows an opacity in the right midlung. This prompted CT scan of the chest, which does not show any mass. Changes most likely due to atelectasis. She will need follow-up for her pulmonary nodule. Patient does have a history of ovarian cancer. She is already followed by oncology. They will have to weigh in, but this can be pursued as an outpatient. She is also followed by pulmonology.  History of ovarian cancer Not on chemotherapy currently. Followed by oncology closely. They're monitoring her tumor markers. Oncologist is aware.  History of hyperlipidemia Holding her statin due to abnormal LFTs  DVT Prophylaxis: SCDs for now    Code Status: Full code  Family Communication: Discussed with the patient and her husband  Disposition Plan: Continue to mobilize. Pain control. Repeat labs tomorrow morning.    LOS: 3 days   [facility] Pager [phone] 08/10/2015, 7:59 AM  If 7PM-7AM, please contact night-coverage at [email or web address], password [facility]

## 2015-08-10 NOTE — Care Management Important Message (Signed)
Important Message  Patient Details  Name: Lynn Ramirez MRN: [id number] Date of Birth: Jan 28, 1941   Medicare Important Message Given:  Yes    Camillo Flaming 08/10/2015, 9:30 AMImportant Message  Patient Details  Name: Lynn Ramirez MRN: [id number] Date of Birth: 1941-03-05   Medicare Important Message Given:  Yes    Camillo Flaming 08/10/2015, 9:30 AM

## 2015-08-11 DIAGNOSIS — J449 Chronic obstructive pulmonary disease, unspecified: Secondary | ICD-10-CM | POA: Insufficient documentation

## 2015-08-11 DIAGNOSIS — K819 Cholecystitis, unspecified: Secondary | ICD-10-CM

## 2015-08-11 DIAGNOSIS — J410 Simple chronic bronchitis: Secondary | ICD-10-CM

## 2015-08-11 DIAGNOSIS — R938 Abnormal findings on diagnostic imaging of other specified body structures: Secondary | ICD-10-CM

## 2015-08-11 DIAGNOSIS — K8000 Calculus of gallbladder with acute cholecystitis without obstruction: Secondary | ICD-10-CM

## 2015-08-11 DIAGNOSIS — R9389 Abnormal findings on diagnostic imaging of other specified body structures: Secondary | ICD-10-CM | POA: Insufficient documentation

## 2015-08-11 DIAGNOSIS — K810 Acute cholecystitis: Secondary | ICD-10-CM

## 2015-08-11 LAB — COMPREHENSIVE METABOLIC PANEL
ALBUMIN: 3.3 g/dL — AB (ref 3.5–5.0)
ALT: 374 U/L — AB (ref 14–54)
AST: 198 U/L — AB (ref 15–41)
Alkaline Phosphatase: 158 U/L — ABNORMAL HIGH (ref 38–126)
Anion gap: 6 (ref 5–15)
BUN: 13 mg/dL (ref 6–20)
CHLORIDE: 105 mmol/L (ref 101–111)
CO2: 30 mmol/L (ref 22–32)
CREATININE: 0.99 mg/dL (ref 0.44–1.00)
Calcium: 9.1 mg/dL (ref 8.9–10.3)
GFR calc Af Amer: >60 mL/min (ref >60)
GFR, EST NON AFRICAN AMERICAN: 56 mL/min — AB (ref >60)
GLUCOSE: 104 mg/dL — AB (ref 65–99)
POTASSIUM: 4 mmol/L (ref 3.5–5.1)
Sodium: 141 mmol/L (ref 135–145)
Total Bilirubin: 0.6 mg/dL (ref 0.3–1.2)
Total Protein: 6.1 g/dL — ABNORMAL LOW (ref 6.5–8.1)

## 2015-08-11 LAB — CBC
HEMATOCRIT: 35.6 % — AB (ref 36.0–46.0)
Hemoglobin: 10.9 g/dL — ABNORMAL LOW (ref 12.0–15.0)
MCH: 29.9 pg (ref 26.0–34.0)
MCHC: 30.6 g/dL (ref 30.0–36.0)
MCV: 97.5 fL (ref 78.0–100.0)
PLATELETS: 197 10*3/uL (ref 150–400)
RBC: 3.65 MIL/uL — AB (ref 3.87–5.11)
RDW: 17 % — ABNORMAL HIGH (ref 11.5–15.5)
WBC: 5.4 10*3/uL (ref 4.0–10.5)

## 2015-08-11 LAB — LIPASE, BLOOD: LIPASE: 21 U/L (ref 11–51)

## 2015-08-11 MED ORDER — OXYCODONE HCL 5 MG PO TABS: 5.0000 mg | ORAL_TABLET | ORAL | Status: DC | PRN

## 2015-08-11 MED ORDER — HEPARIN SOD (PORK) LOCK FLUSH 100 UNIT/ML IV SOLN
500.0000 [IU] | INTRAVENOUS | Status: AC | PRN
  Administered 2015-08-11: 500 [IU]

## 2015-08-11 NOTE — Progress Notes (Signed)
2 Days Post-Op  Subjective: Still has some burning pain in RUQ. No nausea. Passing flatus and having bm  Objective: Vital signs in last 24 hours: Temp:  [97.9 F (36.6 C)-98.5 F (36.9 C)] 97.9 F (36.6 C) (02/01 0446) Pulse Rate:  [64-86] 64 (02/01 0446) Resp:  [16-18] 16 (02/01 0446) BP: (125-141)/(47-57) 141/57 mmHg (02/01 0446) SpO2:  [90 %-95 %] 94 % (02/01 0745) Last BM Date: 08/07/15  Intake/Output from previous day: 01/31 0701 - 02/01 0700 In: 2036.7 [P.O.:160; I.V.:1476.7; IV Piggyback:400] Out: 2200 [Urine:2200] Intake/Output this shift: Total I/O In: -  Out: 175 [Urine:175]  Resp: clear to auscultation bilaterally Cardio: regular rate and rhythm GI: soft. nontender  Lab Results:   Recent Labs  08/10/15 0508 08/11/15 0509  WBC 5.8 5.4  HGB 11.7* 10.9*  HCT 38.3 35.6*  PLT 214 197   BMET  Recent Labs  08/10/15 0508 08/11/15 0509  NA 143 141  K 4.2 4.0  CL 104 105  CO2 31 30  GLUCOSE 135* 104*  BUN 13 13  CREATININE 1.03* 0.99  CALCIUM 9.3 9.1   PT/INR No results for input(s): LABPROT, INR in the last 72 hours. ABG No results for input(s): PHART, HCO3 in the last 72 hours.  Invalid input(s): PCO2, PO2  Studies/Results: Dg Cholangiogram Operative  08/09/2015  CLINICAL DATA:  75 year old female with cholelithiasis and increasing abdominal pain. EXAM: INTRAOPERATIVE CHOLANGIOGRAM TECHNIQUE: Cholangiographic images from the C-arm fluoroscopic device were submitted for interpretation post-operatively. Please see the procedural report for the amount of contrast and the fluoroscopy time utilized. COMPARISON:  CT abdomen/ pelvis 08/07/2015 FINDINGS: Two cine loops obtained during intraoperative cholangiogram at the time of open cholecystectomy demonstrate cannulation of the cystic duct remanent and opacification of the biliary tree. There is mild dilatation of the common hepatic and common bile ducts. Contrast material is slow to fill the distal common  bile duct and is not visualized passing through the ampulla. IMPRESSION: 1. Mild dilatation of the common hepatic and common bile ducts. 2. Slow filling of the distal common bile duct. Contrast is not visualized passing through the ampulla and into the duodenum. Distal common duct choledocholithiasis, stricture, or less likely mass are not excluded radiographically. Electronically Signed   By: Jacqulynn Cadet M.D.   On: 08/09/2015 13:35    Anti-infectives: Anti-infectives    Start     Dose/Rate Route Frequency Ordered Stop   08/08/15 0800  ciprofloxacin (CIPRO) IVPB 400 mg     400 mg 200 mL/hr over 60 Minutes Intravenous Every 12 hours 08/08/15 0709        Assessment/Plan: s/p Procedure(s): Attempted LAPAROSCOPIC coverted  open CHOLECYSTECTOMY WITH INTRAOPERATIVE CHOLANGIOGRAM (N/A) Advance diet  Consider d/c later today if she tolerates diet Will need lft's checked on follow up  LOS: 4 days    TOTH III,Gervis Gaba S 08/11/2015

## 2015-08-11 NOTE — Discharge Summary (Signed)
Physician Discharge Summary  Lynn Ramirez MRN: [id number] DOB/AGE: 07/18/1940 75 y.o.  PCP: Antony Blackbird, MD   Admit date: 08/07/2015 Discharge date: 08/11/2015  Discharge Diagnoses:     Principal Problem:   Acute cholecystitis Active Problems:   COPD  GOLD III   Ovarian cancer ([place])   Hyperlipidemia   Solitary pulmonary nodule on lung CT   Acute calculous cholecystitis   Abnormal CXR   COPD (chronic obstructive pulmonary disease) ([place])    Follow-up recommendations Follow-up with PCP in 3-5 days , including all  additional recommended appointments as below Follow-up CBC, CMP in 3-5 days 4 mm nodule in the right lung base , this will need pulmonary follow-up     Medication List    STOP taking these medications        acetaminophen 500 MG tablet  Commonly known as:  TYLENOL     atorvastatin 10 MG tablet  Commonly known as:  LIPITOR     triamterene-hydrochlorothiazide 37.5-25 MG tablet  Commonly known as:  MAXZIDE-25      TAKE these medications        antiseptic oral rinse Liqd  15 mLs by Mouth Rinse route 3 (three) times daily.     Biotin 2500 MCG Caps  Take 1 tablet by mouth daily.     CALCIUM 600 + D PO  Take 1 tablet by mouth daily.     cholecalciferol 1000 units tablet  Commonly known as:  VITAMIN D  Take 1,000 Units by mouth every morning.     COLON CLEANSE Caps  Take 1 capsule by mouth daily.     fluocinonide cream 0.05 %  Commonly known as:  LIDEX  Apply 1 application topically 2 (two) times daily as needed (For ezcema).     lidocaine-prilocaine cream  Commonly known as:  EMLA  Apply to port site one hour prior to use. Do not rub in. Cover with plastic.     ondansetron 4 MG disintegrating tablet  Commonly known as:  ZOFRAN-ODT  Take 4 mg by mouth daily as needed for nausea or vomiting.     oxyCODONE 5 MG immediate release tablet  Commonly known as:  ROXICODONE  Take 1 tablet (5 mg total) by mouth every 4 (four) hours as needed for  severe pain.     PROAIR HFA 108 (90 Base) MCG/ACT inhaler  Generic drug:  albuterol  INHALE 2 PUFFS BY MOUTH FOUR TIMES DAILY AS NEEDED FOR WHEEZING     SYMBICORT 160-4.5 MCG/ACT inhaler  Generic drug:  budesonide-formoterol  INHALE 2 PUFFS BY MOUTH TWICE DAILY     Tiotropium Bromide Monohydrate 2.5 MCG/ACT Aers  Commonly known as:  SPIRIVA RESPIMAT  2 pffs each am         Discharge Condition: Stable   Discharge Instructions         Disposition: 01-Home or Self Care   Consults:  General surgery    Significant Diagnostic Studies:  Dg Chest 2 View  08/08/2015  CLINICAL DATA:  Preop.  COPD.  Planned cholecystectomy. EXAM: CHEST  2 VIEW COMPARISON:  06/23/2015 FINDINGS: Patchy opacity in the right mid lung, new since prior study. Cannot exclude developing infiltrate. Right Port-A-Cath remains in place, unchanged. Left lung is clear. Heart is normal size. There is hyperinflation of the lungs compatible with COPD. IMPRESSION: Vague airspace opacity in the right mid lung. Cannot exclude developing infiltrate. COPD. Electronically Signed   By: Rolm Baptise M.D.   On: 08/08/2015 09:54  Dg Cholangiogram Operative  08/09/2015  CLINICAL DATA:  75 year old female with cholelithiasis and increasing abdominal pain. EXAM: INTRAOPERATIVE CHOLANGIOGRAM TECHNIQUE: Cholangiographic images from the C-arm fluoroscopic device were submitted for interpretation post-operatively. Please see the procedural report for the amount of contrast and the fluoroscopy time utilized. COMPARISON:  CT abdomen/ pelvis 08/07/2015 FINDINGS: Two cine loops obtained during intraoperative cholangiogram at the time of open cholecystectomy demonstrate cannulation of the cystic duct remanent and opacification of the biliary tree. There is mild dilatation of the common hepatic and common bile ducts. Contrast material is slow to fill the distal common bile duct and is not visualized passing through the ampulla.  IMPRESSION: 1. Mild dilatation of the common hepatic and common bile ducts. 2. Slow filling of the distal common bile duct. Contrast is not visualized passing through the ampulla and into the duodenum. Distal common duct choledocholithiasis, stricture, or less likely mass are not excluded radiographically. Electronically Signed   By: Jacqulynn Cadet M.D.   On: 08/09/2015 13:35   Ct Chest W Contrast  08/08/2015  CLINICAL DATA:  75 year old female with possible right lung opacity identified on preoperative chest radiograph. Former smoker with COPD and ovarian cancer. EXAM: CT CHEST WITH CONTRAST TECHNIQUE: Multidetector CT imaging of the chest was performed during intravenous contrast administration. CONTRAST:  72m OMNIPAQUE IOHEXOL 300 MG/ML  SOLN COMPARISON:  08/08/2015 and prior chest radiographs. 05/31/2015 and prior chest CTs. FINDINGS: Mediastinum/Nodes: Coronary artery and aortic atherosclerotic calcifications are noted without evidence of thoracic aortic aneurysm. Heart size is upper limits of normal. A right sided Port-A-Cath is present with tip in the lower SVC. No enlarged lymph nodes or pericardial effusion. Lungs/Pleura: Mild thickening/atelectasis along the posterior aspect of the right major fissure accounts for the chest radiograph abnormality. There is minimal thickening along the posterior left major fissure. Mild bibasilar atelectasis, right greater than left noted. Mild centrilobular emphysema is present. There is no evidence of airspace disease, pulmonary mass, consolidation, endobronchial/endotracheal lesion or pleural effusion. Upper abdomen: Cholelithiasis identified. Musculoskeletal: No acute or suspicious abnormalities. IMPRESSION: Mild thickening/ atelectasis along the posterior aspect of the right major fissure accounting for the radiographic abnormality. Emphysema and mild bibasilar atelectasis. Coronary artery disease and aortic atherosclerosis. Cholelithiasis. Electronically Signed    By: JMargarette CanadaM.D.   On: 08/08/2015 18:58   Ct Abdomen Pelvis W Contrast  08/07/2015  CLINICAL DATA:  Sudden onset severe abdominal pain about an hour ago. History of ovarian cancer. Nausea. History of gallstones. EXAM: CT ABDOMEN AND PELVIS WITH CONTRAST TECHNIQUE: Multidetector CT imaging of the abdomen and pelvis was performed using the standard protocol following bolus administration of intravenous contrast. CONTRAST:  1098mOMNIPAQUE IOHEXOL 300 MG/ML  SOLN COMPARISON:  05/31/2015 FINDINGS: Mild dependent changes in the lung bases. 4 mm pleural-based nodule in the right lower lung posteriorly. This was not present on the previous study. Given the history of neoplasm, six-month follow-up is suggested. Cholelithiasis with large stones in the gallbladder. Increased density of the bowel which may indicate vicarious contrast excretion or gallbladder sludge. Mild pericholecystic edema. Changes are nonspecific but could indicate cholecystitis. No bile duct dilatation. The liver, spleen, pancreas, right kidney, inferior vena cava, and retroperitoneal lymph nodes are unremarkable. Left kidney is absent, presumably postoperative. 1.3 cm diameter left adrenal gland nodule is unchanged since previous study. Stomach, small bowel, and colon are not abnormally distended. Scattered diverticula in the colon. There is a midline anterior abdominal wall hernia containing a portion of the transverse colon but without  evidence of proximal obstruction. The hernia has developed since the previous study. No free air or free fluid in the abdomen. Pelvis: The appendix is not identified. Diverticulosis of the sigmoid colon without evidence of diverticulitis. Surgical absence of the uterus. No pelvic mass or lymphadenopathy. Bladder wall is not thickened. No free or loculated pelvic fluid collections. Mild degenerative changes in the spine and hips. No destructive bone lesions. IMPRESSION: Cholelithiasis with increased density in  the bile and mild pericholecystic edema. Changes are nonspecific but could indicate acute cholecystitis. No significant bile duct dilatation. Midline anterior abdominal wall hernia containing transverse colon without evidence of obstruction. Diverticulosis of the colon without evidence of diverticulitis. 4 mm nodule in the right lung base may be new since previous study. Six-month follow-up recommended. Electronically Signed   By: Lucienne Capers M.D.   On: 08/07/2015 20:38        Filed Weights   08/07/15 2243  Weight: 79.833 kg (176 lb)     Microbiology: Recent Results (from the past 240 hour(s))  Surgical pcr screen     Status: None   Collection Time: 08/09/15  6:49 AM  Result Value Ref Range Status   MRSA, PCR NEGATIVE NEGATIVE Final   Staphylococcus aureus NEGATIVE NEGATIVE Final    Comment:        The Xpert SA Assay (FDA approved for NASAL specimens in patients over 25 years of age), is one component of a comprehensive surveillance program.  Test performance has been validated by [facility] for patients greater than or equal to 44 year old. It is not intended to diagnose infection nor to guide or monitor treatment.        Blood Culture    Component Value Date/Time   SDES PLEURAL RIGHT 09/30/2014 1351   SPECREQUEST Normal 09/30/2014 1351   CULT  09/30/2014 1351    NO GROWTH 3 DAYS Performed at [facility] 10/04/2014 FINAL 09/30/2014 1351      Labs: Results for orders placed or performed during the hospital encounter of 08/07/15 (from the past 48 hour(s))  CBC     Status: Abnormal   Collection Time: 08/10/15  5:08 AM  Result Value Ref Range   WBC 5.8 4.0 - 10.5 K/uL   RBC 3.97 3.87 - 5.11 MIL/uL   Hemoglobin 11.7 (L) 12.0 - 15.0 g/dL   HCT 38.3 36.0 - 46.0 %   MCV 96.5 78.0 - 100.0 fL   MCH 29.5 26.0 - 34.0 pg   MCHC 30.5 30.0 - 36.0 g/dL   RDW 16.4 (H) 11.5 - 15.5 %   Platelets 214 150 - 400 K/uL  Comprehensive metabolic  panel     Status: Abnormal   Collection Time: 08/10/15  5:08 AM  Result Value Ref Range   Sodium 143 135 - 145 mmol/L   Potassium 4.2 3.5 - 5.1 mmol/L   Chloride 104 101 - 111 mmol/L   CO2 31 22 - 32 mmol/L   Glucose, Bld 135 (H) 65 - 99 mg/dL   BUN 13 6 - 20 mg/dL   Creatinine, Ser 1.03 (H) 0.44 - 1.00 mg/dL   Calcium 9.3 8.9 - 10.3 mg/dL   Total Protein 6.6 6.5 - 8.1 g/dL   Albumin 3.4 (L) 3.5 - 5.0 g/dL   AST 424 (H) 15 - 41 U/L   ALT 520 (H) 14 - 54 U/L   Alkaline Phosphatase 189 (H) 38 - 126 U/L   Total Bilirubin 1.4 (H) 0.3 -  1.2 mg/dL   GFR calc non Af Amer 53 (L) >60 mL/min   GFR calc Af Amer >60 >60 mL/min    Comment: (NOTE) The eGFR has been calculated using the CKD EPI equation. This calculation has not been validated in all clinical situations. eGFR's persistently <60 mL/min signify possible Chronic Kidney Disease.    Anion gap 8 5 - 15  Lipase, blood     Status: None   Collection Time: 08/10/15  5:08 AM  Result Value Ref Range   Lipase 18 11 - 51 U/L  Lipase, blood     Status: None   Collection Time: 08/11/15  5:09 AM  Result Value Ref Range   Lipase 21 11 - 51 U/L  CBC     Status: Abnormal   Collection Time: 08/11/15  5:09 AM  Result Value Ref Range   WBC 5.4 4.0 - 10.5 K/uL   RBC 3.65 (L) 3.87 - 5.11 MIL/uL   Hemoglobin 10.9 (L) 12.0 - 15.0 g/dL   HCT 35.6 (L) 36.0 - 46.0 %   MCV 97.5 78.0 - 100.0 fL   MCH 29.9 26.0 - 34.0 pg   MCHC 30.6 30.0 - 36.0 g/dL   RDW 17.0 (H) 11.5 - 15.5 %   Platelets 197 150 - 400 K/uL  Comprehensive metabolic panel     Status: Abnormal   Collection Time: 08/11/15  5:09 AM  Result Value Ref Range   Sodium 141 135 - 145 mmol/L   Potassium 4.0 3.5 - 5.1 mmol/L   Chloride 105 101 - 111 mmol/L   CO2 30 22 - 32 mmol/L   Glucose, Bld 104 (H) 65 - 99 mg/dL   BUN 13 6 - 20 mg/dL   Creatinine, Ser 0.99 0.44 - 1.00 mg/dL   Calcium 9.1 8.9 - 10.3 mg/dL   Total Protein 6.1 (L) 6.5 - 8.1 g/dL   Albumin 3.3 (L) 3.5 - 5.0 g/dL    AST 198 (H) 15 - 41 U/L   ALT 374 (H) 14 - 54 U/L   Alkaline Phosphatase 158 (H) 38 - 126 U/L   Total Bilirubin 0.6 0.3 - 1.2 mg/dL   GFR calc non Af Amer 56 (L) >60 mL/min   GFR calc Af Amer >60 >60 mL/min    Comment: (NOTE) The eGFR has been calculated using the CKD EPI equation. This calculation has not been validated in all clinical situations. eGFR's persistently <60 mL/min signify possible Chronic Kidney Disease.    Anion gap 6 5 - 15     Lipid Panel  No results found for: CHOL, TRIG, HDL, CHOLHDL, VLDL, LDLCALC, LDLDIRECT   No results found for: HGBA1C   Lab Results  Component Value Date   CREATININE 0.99 08/11/2015     HPI :*HPI: Lynn Ramirez is a 75 y.o. female with a past medical history of hydronephrosis, S/PE nephrectomy, hypertension, COPD, ovarian cancer, eczema of the hands, GERD who comes to the emergency department due to sudden onset of severe abdominal pain at about 1700. Per patient, she started having this intense abdominal pain, associated with nausea while she was at home. She used one of her Zofran sublingual tablets which relieved the nausea and denies any having any vomiting.  In the emergency department, the patient received 4 mg of morphine, which has relieved the pain significantly. She states that pain is minimal at the moment and denies having any nausea. Workup shows a CT scan with increased bowel then sensitive and pericholecystic edema, suspicious for  cholelithiasis. General surgery was consulted and she was thought to have acute calculous cholecystitis. Status post CHOLECYSTECTOMY WITH INTRAOPERATIVE CHOLANGIOGRAM on 1/30   HOSPITAL COURSE:  Acute cholecystitis Patient is status post cholecystectomy. Liver function to go up after surgery, now trending down. Lipase was found to be normal after surgery Patient continued to have some postoperative pain . Cholangiogram did not show any filling defects. However, contrast did not empty out into  the duodenum. However liver function started to improve on the day of discharge Patient passing flatulence and also had a BM, diet advanced by surgery Patient anticipated to discharge home today if tolerating diet As per surgery no antibiotics were indicated at the time of discharge  History of COPD, gold 3 Stable. Continue with her home medications including her inhaled steroid as well as spiriva. X-ray suggested an opacity in the right mid lung. CT scan was done which shows evidence for atelectasis. Incentive spirometry was continued during this hospitalization. Resume outpatient medications at the time of discharge   Abnormal chest x-ray/Solitary pulmonary nodule The solitary nodule was detected on the lung images seen on CT scan of abdomen. Histex shows an opacity in the right midlung. This prompted CT scan of the chest, which does not show any mass. Changes most likely due to atelectasis. She will need follow-up for her pulmonary nodule. Patient does have a history of ovarian cancer. She is already followed by oncology. They will have to weigh in, but this can be pursued as an outpatient. She is also followed by pulmonology.  History of ovarian cancer Not on chemotherapy currently. Followed by oncology closely. They're monitoring her tumor markers. Oncologist is aware.  History of hyperlipidemia Holding her statin due to abnormal LFTs   Discharge Exam:  Blood pressure 141/57, pulse 64, temperature 97.9 F (36.6 C), temperature source Oral, resp. rate 16, height _0  (1.549 m), weight 79.833 kg (176 lb), SpO2 94 %.  General appearance: alert, cooperative, appears stated age and no distress Resp: clear to auscultation bilaterally Cardio: regular rate and rhythm, S1, S2 normal, no murmur, click, rub or gallop GI: Abdomen is obese. Soft. Continues to be tender in the upper abdomen, especially on the right upper quadrant. No tenderness in the epigastric area. Bowel sounds are present. No  masses or organomegaly.  Extremities: extremities normal, atraumatic, no cyanosis or edema Neurologic: Awake and alert. Oriented 3. No focal neurological deficits        Follow-up Information    Follow up with TOTH III,PAUL S, MD. Call in 1 week.   Specialty:  General Surgery   Why:  For post-operation check with Dr. Marlou Starks in 1 week, please go to [facility] lab prior to your appointment.  It is located in the [place] [address] office building on the 2nd floor.   Contact information:   [place] [address] [phone]       Please follow up.   Why:  PLEASE GO TO [facility] IN OUR OFFICE BUILDING TO GET YOUR LABS CHECK PRIOR TO YOUR APPOINTMENT WITH DR. Marlou Starks.  TRY TO GET THERE 1 HOUR EARLY SO THE LABS ARE BACK FOR YOUR APPOINTMENT.      SignedReyne Dumas 08/11/2015, 10:16 AM        Time spent >45 mins

## 2015-08-16 ENCOUNTER — Telehealth: Payer: Self-pay | Admitting: Oncology

## 2015-08-16 ENCOUNTER — Telehealth: Payer: Self-pay | Admitting: *Deleted

## 2015-08-16 NOTE — Telephone Encounter (Signed)
per pof to sch pt appt-per pof pt aware °

## 2015-08-16 NOTE — Telephone Encounter (Signed)
Pt left message "I just got my gallbladder out and they said the cancer is in the abdominal lining and I want to see Dr. Benay Spice sooner than March if I could"  Per Dr. Benay Spice; notified pt that MD can see her 2/21 @ 10 am and will discuss options at that time.  Pt verbalized understanding and expressed appreciation for call back.

## 2015-08-17 DIAGNOSIS — K819 Cholecystitis, unspecified: Secondary | ICD-10-CM | POA: Diagnosis not present

## 2015-08-23 ENCOUNTER — Other Ambulatory Visit: Payer: Self-pay

## 2015-08-23 ENCOUNTER — Telehealth: Payer: Self-pay | Admitting: Oncology

## 2015-08-23 ENCOUNTER — Other Ambulatory Visit: Payer: Medicare Other

## 2015-08-23 ENCOUNTER — Ambulatory Visit: Payer: Medicare Other | Admitting: Gynecologic Oncology

## 2015-08-23 NOTE — Telephone Encounter (Signed)
Per 2/13 pof r/s missed flush. Spoke with patient and per patient what she was trying to explain was that she did not need flush today due to port flush at hosp 2/1. Patient will keep 2/21 f/u as scheduled and is aware she has a flush with 3/27 f/u. No need to r/s.

## 2015-08-23 NOTE — Telephone Encounter (Signed)
Correction lab/flush 3/27 and cxr due also - f/u 2/21 and 3/8. Patient will get new schedule 2/21.

## 2015-08-31 ENCOUNTER — Ambulatory Visit (HOSPITAL_BASED_OUTPATIENT_CLINIC_OR_DEPARTMENT_OTHER): Payer: Medicare Other | Admitting: Oncology

## 2015-08-31 ENCOUNTER — Ambulatory Visit (HOSPITAL_BASED_OUTPATIENT_CLINIC_OR_DEPARTMENT_OTHER): Payer: Medicare Other

## 2015-08-31 ENCOUNTER — Telehealth: Payer: Self-pay | Admitting: Oncology

## 2015-08-31 VITALS — BP 138/74 | HR 83 | Temp 97.8°F | Resp 18 | Ht 61.0 in | Wt 176.9 lb

## 2015-08-31 DIAGNOSIS — C482 Malignant neoplasm of peritoneum, unspecified: Secondary | ICD-10-CM | POA: Diagnosis not present

## 2015-08-31 DIAGNOSIS — Z8543 Personal history of malignant neoplasm of ovary: Secondary | ICD-10-CM | POA: Diagnosis not present

## 2015-08-31 DIAGNOSIS — Z452 Encounter for adjustment and management of vascular access device: Secondary | ICD-10-CM

## 2015-08-31 DIAGNOSIS — Z95828 Presence of other vascular implants and grafts: Secondary | ICD-10-CM

## 2015-08-31 DIAGNOSIS — C799 Secondary malignant neoplasm of unspecified site: Secondary | ICD-10-CM

## 2015-08-31 LAB — CBC WITH DIFFERENTIAL/PLATELET
BASO%: 0.3 % (ref 0.0–2.0)
BASOS ABS: 0 10*3/uL (ref 0.0–0.1)
EOS%: 2.1 % (ref 0.0–7.0)
Eosinophils Absolute: 0.1 10*3/uL (ref 0.0–0.5)
HEMATOCRIT: 39.9 % (ref 34.8–46.6)
HGB: 12.9 g/dL (ref 11.6–15.9)
LYMPH#: 1 10*3/uL (ref 0.9–3.3)
LYMPH%: 16.3 % (ref 14.0–49.7)
MCH: 29.9 pg (ref 25.1–34.0)
MCHC: 32.3 g/dL (ref 31.5–36.0)
MCV: 92.4 fL (ref 79.5–101.0)
MONO#: 0.4 10*3/uL (ref 0.1–0.9)
MONO%: 6.6 % (ref 0.0–14.0)
NEUT#: 4.6 10*3/uL (ref 1.5–6.5)
NEUT%: 74.7 % (ref 38.4–76.8)
Platelets: 240 10*3/uL (ref 145–400)
RBC: 4.32 10*6/uL (ref 3.70–5.45)
RDW: 16 % — AB (ref 11.2–14.5)
WBC: 6.2 10*3/uL (ref 3.9–10.3)

## 2015-08-31 LAB — COMPREHENSIVE METABOLIC PANEL
ALT: 16 U/L (ref 0–55)
AST: 13 U/L (ref 5–34)
Albumin: 3.7 g/dL (ref 3.5–5.0)
Alkaline Phosphatase: 101 U/L (ref 40–150)
Anion Gap: 8 mEq/L (ref 3–11)
BUN: 24.5 mg/dL (ref 7.0–26.0)
CHLORIDE: 101 meq/L (ref 98–109)
CO2: 29 meq/L (ref 22–29)
CREATININE: 0.9 mg/dL (ref 0.6–1.1)
Calcium: 11 mg/dL — ABNORMAL HIGH (ref 8.4–10.4)
EGFR: 61 mL/min/{1.73_m2} — ABNORMAL LOW (ref >90)
GLUCOSE: 99 mg/dL (ref 70–140)
POTASSIUM: 4 meq/L (ref 3.5–5.1)
SODIUM: 139 meq/L (ref 136–145)
Total Bilirubin: 0.42 mg/dL (ref 0.20–1.20)
Total Protein: 7.5 g/dL (ref 6.4–8.3)

## 2015-08-31 MED ORDER — HEPARIN SOD (PORK) LOCK FLUSH 100 UNIT/ML IV SOLN
500.0000 [IU] | Freq: Once | INTRAVENOUS | Status: AC
  Administered 2015-08-31: 500 [IU] via INTRAVENOUS
  Filled 2015-08-31: qty 5

## 2015-08-31 MED ORDER — SODIUM CHLORIDE 0.9% FLUSH
10.0000 mL | INTRAVENOUS | Status: DC | PRN
  Administered 2015-08-31: 10 mL via INTRAVENOUS
  Filled 2015-08-31: qty 10

## 2015-08-31 NOTE — Progress Notes (Signed)
Lynn Ramirez   Diagnosis: Ovarian cancer  INTERVAL HISTORY:   Lynn Ramirez returns as scheduled. Lynn Ramirez was admitted 08/07/2015 with acute cholecystitis. Lynn Ramirez underwent surgery 08/09/2015 by Dr. Marlou Starks. Peritoneal studding was noted, most prominent in the pelvis. Lynn Ramirez underwent a cholecystectomy was performed and the gallbladder pathology revealing chronic cholecystitis and cholelithiasis.  Lynn Ramirez has soreness at the right upper abdomen. Lynn Ramirez complains of pain at the right posterior chest wall with a deep inspiration or cough.  Objective:  Vital signs in last 24 hours:  Blood pressure 138/74, pulse 83, temperature 97.8 F (36.6 C), temperature source Oral, resp. rate 18, height 5\' 1"  (1.549 m), weight 176 lb 14.4 oz (80.241 kg), SpO2 99 %.    HEENT: Neck without mass Lymphatics: No cervical or supraclavicular nodes Resp: Distant breath sounds, no respiratory distress Cardio: Regular rate and rhythm GI: Mild tenderness at the right upper abdomen incision, no mass, no apparent ascites, no hepatomegaly Vascular: No leg edema   Portacath/PICC-without erythema  Lab Results:  Lab Results  Component Value Date   WBC 5.4 08/11/2015   HGB 10.9* 08/11/2015   HCT 35.6* 08/11/2015   MCV 97.5 08/11/2015   PLT 197 08/11/2015   NEUTROABS 2.6 08/08/2015    Medications: I have reviewed the patient's current medications.  Assessment/Plan: 1. 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 Significant improvement in malignant right pleural effusion.  Status post robotic-assisted laparoscopic hysterectomy with bilateral salpingoophorectomy, omentectomy, radical tumor debulking 12/29/2014. Per Dr. Serita Grit office Ramirez 01/14/2015 cytoreduction was optimal with residual disease remaining only in the 1 mm implants on the small intestine. 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  Cycle 1 adjuvant Taxol/carboplatin 01/20/2015  Cycle 2 adjuvant Taxol/carboplatin 02/10/2015  Cycle 3 adjuvant Taxol/carboplatin 03/10/2015  CA 125 on 1013 2016-42  CTs of the chest, abdomen, and pelvis 05/31/2015 with no evidence of progressive ovarian cancer  CTs of the chest, abdomen, and pelvis 08/07/2015 and 08/08/2015-no evidence of progressive ovarian cancer  Peritoneal studding noted at the time of the cholecystectomy procedure 08/09/2015 2. COPD 3. Dyspnea secondary to COPD and the large right pleural effusion, status post therapeutic thoracentesis procedures 09/30/2014,10/09/2014, and 10/21/2014. Left thoracentesis 10/30/2014 4. Left nephrectomy as a child 5. Delayed nausea following cycle 1 Taxol/carboplatin, Aloxi/Emend added with cycle 2 with improvement. 6. Right lower extremity edema, right calf pain 12/09/2014. Negative venous Doppler 12/09/2014. 7. Diffuse pruritus following cycle 1 adjuvant Taxol/carboplatin 01/20/2015, no rash,  resolved with a steroid dose pack 8. Thrombocytopenia second to chemotherapy, the carboplatin was dose reduced with cycle 2 adjuvant Taxol/carboplatin 02/10/2015 9. Chemotherapy-induced peripheral neuropathy-improved 10. Admission with acute cholecystitis 08/07/2015, status post a cholecystectomy 08/09/2015   Disposition:  Lynn Ramirez  remains in clinical remission from ovarian cancer. The CA 125 was mildly elevated and peritoneal studding was noted at the cholecystectomy procedure last month. Lynn Ramirez remains asymptomatic. We decided to continue observation.  Lynn Ramirez will have a Port-A-Cath flush and CA 125 today. Lynn Ramirez will return for an office visit and Port-A-Cath flush in 6 weeks. The intermittent discomfort at the right posterior chest wall may be related to pleural tumor in the right chest. I have a low clinical suspicion for a pulmonary embolism.  Betsy Coder, MD  08/31/2015  10:53 AM

## 2015-08-31 NOTE — Telephone Encounter (Signed)
Pt confirmed labs/ov per 02/21 POF, gave pt AVS and Calendar... KJ °

## 2015-08-31 NOTE — Patient Instructions (Signed)

## 2015-09-01 ENCOUNTER — Telehealth: Payer: Self-pay | Admitting: Oncology

## 2015-09-01 ENCOUNTER — Telehealth: Payer: Self-pay | Admitting: *Deleted

## 2015-09-01 DIAGNOSIS — C799 Secondary malignant neoplasm of unspecified site: Secondary | ICD-10-CM

## 2015-09-01 DIAGNOSIS — C482 Malignant neoplasm of peritoneum, unspecified: Secondary | ICD-10-CM

## 2015-09-01 LAB — CANCER ANTIGEN 125 (PARALLEL TESTING): CA 125: 173 U/mL — ABNORMAL HIGH (ref <35)

## 2015-09-01 LAB — CA 125: CANCER ANTIGEN (CA) 125: 108 U/mL — AB (ref 0.0–38.1)

## 2015-09-01 NOTE — Telephone Encounter (Signed)
-----   Message from Ladell Pier, MD sent at 08/31/2015  6:00 PM EST ----- Please call patient, calcium is high-likely due to calcium supplement, hold calcium and repeat 1 week

## 2015-09-01 NOTE — Telephone Encounter (Signed)
Called pt with lab results. She will hold Calcium for one week and come in for repeat CMET. Pt requests CA 125 results to be released to MyChart when available.

## 2015-09-01 NOTE — Telephone Encounter (Signed)
Labs/flush added per 02/22 POF, pt is aware... KJ

## 2015-09-02 ENCOUNTER — Telehealth: Payer: Self-pay | Admitting: *Deleted

## 2015-09-02 NOTE — Telephone Encounter (Signed)
Message left for pt regarding increase in Ca125 level and Dr. Gearldine Shown order to continue to observe pt unless she has new symptoms at this time.

## 2015-09-02 NOTE — Telephone Encounter (Signed)
-----   Message from Ladell Pier, MD sent at 09/01/2015  4:59 PM EST ----- Please call patient, ca125 is higher, plan to continue observation unless she has new symptoms

## 2015-09-07 ENCOUNTER — Other Ambulatory Visit (HOSPITAL_BASED_OUTPATIENT_CLINIC_OR_DEPARTMENT_OTHER): Payer: Medicare Other

## 2015-09-07 ENCOUNTER — Ambulatory Visit (HOSPITAL_BASED_OUTPATIENT_CLINIC_OR_DEPARTMENT_OTHER): Payer: Medicare Other

## 2015-09-07 VITALS — BP 120/57 | HR 86 | Temp 98.3°F | Resp 18

## 2015-09-07 DIAGNOSIS — Z8543 Personal history of malignant neoplasm of ovary: Secondary | ICD-10-CM

## 2015-09-07 DIAGNOSIS — Z95828 Presence of other vascular implants and grafts: Secondary | ICD-10-CM

## 2015-09-07 DIAGNOSIS — Z452 Encounter for adjustment and management of vascular access device: Secondary | ICD-10-CM

## 2015-09-07 DIAGNOSIS — C482 Malignant neoplasm of peritoneum, unspecified: Secondary | ICD-10-CM

## 2015-09-07 LAB — COMPREHENSIVE METABOLIC PANEL
ALBUMIN: 3.6 g/dL (ref 3.5–5.0)
ALK PHOS: 90 U/L (ref 40–150)
ALT: 15 U/L (ref 0–55)
ANION GAP: 8 meq/L (ref 3–11)
AST: 14 U/L (ref 5–34)
BILIRUBIN TOTAL: 0.45 mg/dL (ref 0.20–1.20)
BUN: 24.9 mg/dL (ref 7.0–26.0)
CALCIUM: 9.8 mg/dL (ref 8.4–10.4)
CO2: 33 mEq/L — ABNORMAL HIGH (ref 22–29)
Chloride: 97 mEq/L — ABNORMAL LOW (ref 98–109)
Creatinine: 1.2 mg/dL — ABNORMAL HIGH (ref 0.6–1.1)
EGFR: 43 mL/min/{1.73_m2} — AB (ref >90)
Glucose: 99 mg/dl (ref 70–140)
Potassium: 4.3 mEq/L (ref 3.5–5.1)
Sodium: 139 mEq/L (ref 136–145)
TOTAL PROTEIN: 7.1 g/dL (ref 6.4–8.3)

## 2015-09-07 MED ORDER — HEPARIN SOD (PORK) LOCK FLUSH 100 UNIT/ML IV SOLN
500.0000 [IU] | Freq: Once | INTRAVENOUS | Status: AC
  Administered 2015-09-07: 500 [IU] via INTRAVENOUS
  Filled 2015-09-07: qty 5

## 2015-09-07 MED ORDER — SODIUM CHLORIDE 0.9% FLUSH
10.0000 mL | INTRAVENOUS | Status: DC | PRN
  Administered 2015-09-07: 10 mL via INTRAVENOUS
  Filled 2015-09-07: qty 10

## 2015-09-07 NOTE — Patient Instructions (Signed)

## 2015-09-09 ENCOUNTER — Telehealth: Payer: Self-pay

## 2015-09-09 DIAGNOSIS — C569 Malignant neoplasm of unspecified ovary: Secondary | ICD-10-CM | POA: Diagnosis not present

## 2015-09-09 DIAGNOSIS — E559 Vitamin D deficiency, unspecified: Secondary | ICD-10-CM | POA: Diagnosis not present

## 2015-09-09 DIAGNOSIS — R8299 Other abnormal findings in urine: Secondary | ICD-10-CM | POA: Diagnosis not present

## 2015-09-09 DIAGNOSIS — E785 Hyperlipidemia, unspecified: Secondary | ICD-10-CM | POA: Diagnosis not present

## 2015-09-09 DIAGNOSIS — E538 Deficiency of other specified B group vitamins: Secondary | ICD-10-CM | POA: Diagnosis not present

## 2015-09-09 DIAGNOSIS — Z79899 Other long term (current) drug therapy: Secondary | ICD-10-CM | POA: Diagnosis not present

## 2015-09-09 DIAGNOSIS — I10 Essential (primary) hypertension: Secondary | ICD-10-CM | POA: Diagnosis not present

## 2015-09-09 DIAGNOSIS — R7309 Other abnormal glucose: Secondary | ICD-10-CM | POA: Diagnosis not present

## 2015-09-09 DIAGNOSIS — J449 Chronic obstructive pulmonary disease, unspecified: Secondary | ICD-10-CM | POA: Diagnosis not present

## 2015-09-09 NOTE — Telephone Encounter (Signed)
Patient called today regarding the results of her lab work.  She had several questions for Dr. Benay Spice- she was taken off her calcium for one week by MD for repeat labs and now the calcium level is normal, she is wondering whether Dr. Benay Spice would like her to continue back on the calcium or stay off of it.  Her preference would be not to go back on it.  She was also concerned with her EGFR dropping and whether to be worried about it.  She also noted her creatinine was slightly raised and because she only has one kidney that also concerns her. Patient is requesting a call back to discuss.

## 2015-09-09 NOTE — Telephone Encounter (Signed)
Returned call to pt, she reports she had labs checked with Dr. Chapman Fitch. Will get results next week. She will request a copy to be sent to this office. Per Dr. Benay Spice: OK to stay off Calcium. Pt asks to have labs checked day before office visit to review results during visit.  Order to schedulers to move Lab/Flush appt.

## 2015-09-10 ENCOUNTER — Telehealth: Payer: Self-pay | Admitting: Oncology

## 2015-09-10 NOTE — Telephone Encounter (Signed)
Spoke with patient to confirm appt changes for 3/30. Lab/flushe moved to 3/29 and ov 3/30 with GBS per 3/2 pof

## 2015-09-15 DIAGNOSIS — E559 Vitamin D deficiency, unspecified: Secondary | ICD-10-CM | POA: Diagnosis not present

## 2015-09-15 DIAGNOSIS — E538 Deficiency of other specified B group vitamins: Secondary | ICD-10-CM | POA: Diagnosis not present

## 2015-09-15 DIAGNOSIS — E785 Hyperlipidemia, unspecified: Secondary | ICD-10-CM | POA: Diagnosis not present

## 2015-09-15 DIAGNOSIS — C569 Malignant neoplasm of unspecified ovary: Secondary | ICD-10-CM | POA: Diagnosis not present

## 2015-09-15 DIAGNOSIS — Z9049 Acquired absence of other specified parts of digestive tract: Secondary | ICD-10-CM | POA: Diagnosis not present

## 2015-09-15 DIAGNOSIS — J449 Chronic obstructive pulmonary disease, unspecified: Secondary | ICD-10-CM | POA: Diagnosis not present

## 2015-09-15 DIAGNOSIS — R7309 Other abnormal glucose: Secondary | ICD-10-CM | POA: Diagnosis not present

## 2015-09-15 DIAGNOSIS — I10 Essential (primary) hypertension: Secondary | ICD-10-CM | POA: Diagnosis not present

## 2015-09-22 DIAGNOSIS — E538 Deficiency of other specified B group vitamins: Secondary | ICD-10-CM | POA: Diagnosis not present

## 2015-09-24 ENCOUNTER — Other Ambulatory Visit: Payer: Self-pay | Admitting: *Deleted

## 2015-09-24 MED ORDER — BUDESONIDE-FORMOTEROL FUMARATE 160-4.5 MCG/ACT IN AERO: 2.0000 | INHALATION_SPRAY | Freq: Two times a day (BID) | RESPIRATORY_TRACT | Status: DC

## 2015-09-29 DIAGNOSIS — E538 Deficiency of other specified B group vitamins: Secondary | ICD-10-CM | POA: Diagnosis not present

## 2015-10-01 ENCOUNTER — Ambulatory Visit (INDEPENDENT_AMBULATORY_CARE_PROVIDER_SITE_OTHER): Payer: Medicare Other | Admitting: Cardiology

## 2015-10-01 ENCOUNTER — Encounter: Payer: Self-pay | Admitting: Cardiology

## 2015-10-01 VITALS — BP 132/70 | HR 79 | Ht 61.0 in | Wt 182.0 lb

## 2015-10-01 DIAGNOSIS — I2510 Atherosclerotic heart disease of native coronary artery without angina pectoris: Secondary | ICD-10-CM | POA: Diagnosis not present

## 2015-10-01 DIAGNOSIS — Z79899 Other long term (current) drug therapy: Secondary | ICD-10-CM | POA: Diagnosis not present

## 2015-10-01 DIAGNOSIS — E785 Hyperlipidemia, unspecified: Secondary | ICD-10-CM

## 2015-10-01 DIAGNOSIS — I2583 Coronary atherosclerosis due to lipid rich plaque: Secondary | ICD-10-CM

## 2015-10-01 DIAGNOSIS — I739 Peripheral vascular disease, unspecified: Secondary | ICD-10-CM | POA: Diagnosis not present

## 2015-10-01 NOTE — Patient Instructions (Addendum)
Medication Instructions:  RESTART ATORVASTATIN  10 MG  EVERY DAY  Labwork: 2 MONTHS  FASTING  BMET  LIPID  AND LIVER LATE  MAY   Testing/Procedures: NONE  Follow-Up: Your physician wants you to follow-up in: [place] will receive a reminder letter in the mail two months in advance. If you don't receive a letter, please call our office to schedule the follow-up appointment.  Any Other Special Instructions Will Be Listed Below (If Applicable).     If you need a refill on your cardiac medications before your next appointment, please call your pharmacy.

## 2015-10-01 NOTE — Progress Notes (Signed)
Patient ID: Lynn Ramirez, female   DOB: Nov 17, 1940, 75 y.o.   MRN: [id number]      Cardiology Office Note  Date:  10/01/2015   ID:  Lynn Ramirez, DOB [birth year], MRN [id number]  PCP:  Antony Blackbird, MD  Cardiologist:  Dorothy Spark, MD   Chief complaint: Follow-up for coronary artery disease and hyperlipidemia.  History of Present Illness: Lynn Ramirez is a 75 y.o. female who presents for evaluation of DOE and abnormal chest CT. The patient is being treated for ovarian cancer with metastasis, currently in remission. She has h/o COPD, hyperlipidemia and hypertension.  She underwent chest CT for evaluation of metastasis and as found to have coronary calcifications.  She denies LE edema, orthopnea, PND, syncope.  She complains of RLE pain mostly on exertion but also at rest.  She has FH of premature CAD.  10/01/2015 - the patient is coming after 6 months, in the meantime she underwent Lexiscan nuclear stress test that was negative for prior infarct or ischemia. She was started atorvastatin 10 mg daily for LDL of 1:30 as well as evidence of calcifications on the coronary arteries on the chest CT. About 2 months ago she underwent cholecystectomy with significant elevation of her LFTs and her atorvastatin was discontinued. Recheck LFTs on femora 22,017 were all back to normal. Today patient denies any chest pain shortness of breath no palpitations or syncope no lower extremity edema no further claudications.   Past Medical History  Diagnosis Date  . H/O hydronephrosis   . Hypertension   . Emphysema   . COPD (chronic obstructive pulmonary disease) ([place])   . Cancer ([place])     ovarian  . Eczema     hands  . GERD (gastroesophageal reflux disease)   . History of transfusion     age 31  . Difficulty sleeping     Past Surgical History  Procedure Laterality Date  . Nephrectomy    . Tubal ligation    . Hand surgery Right 1980's  . Thoracentesis      several  . Robotic  assisted total hysterectomy with bilateral salpingo oopherectomy Bilateral 12/29/2014    Procedure: ROBOTIC ASSISTED TOTAL LAPAROSCOPIC HYSTERECTOMY WITH BILATERAL SALPINGO OOPHORECTOMY AND OOMENTECTOMY WITH RADICAL TUMOR [place] ;  Surgeon: Everitt Amber, MD;  Location: [facility];  Service: Gynecology;  Laterality: Bilateral;  . Laparotomy N/A 12/29/2014    Procedure:  LAPAROTOMY;  Surgeon: Everitt Amber, MD;  Location: [facility];  Service: Gynecology;  Laterality: N/A;  . Tonsillectomy    . Cholecystectomy N/A 08/09/2015    Procedure: Attempted LAPAROSCOPIC coverted  open CHOLECYSTECTOMY WITH INTRAOPERATIVE CHOLANGIOGRAM;  Surgeon: Autumn Messing III, MD;  Location: [facility];  Service: General;  Laterality: N/A;     Current Outpatient Prescriptions  Medication Sig Dispense Refill  . Acetaminophen (TYLENOL) 325 MG CAPS Take 1 tablet by mouth 3 (three) times daily as needed.    Marland Kitchen antiseptic oral rinse (BIOTENE) LIQD 15 mLs by Mouth Rinse route 3 (three) times daily.    . Biotin 2500 MCG CAPS Take 1 tablet by mouth daily.    . budesonide-formoterol (SYMBICORT) 160-4.5 MCG/ACT inhaler Inhale 2 puffs into the lungs 2 (two) times daily. 10.2 g 3  . Cyanocobalamin (B-12 IJ) Inject as directed once a week.    . fluocinonide cream (LIDEX) [id number] % Apply 1 application topically 2 (two) times daily as needed (For ezcema).     . lidocaine-prilocaine (EMLA) cream Apply to port site one hour prior to use.  Do not rub in. Cover with plastic. 30 g 0  . Misc Natural Products (COLON CLEANSE) CAPS Take 1 capsule by mouth daily.    . ondansetron (ZOFRAN-ODT) 4 MG disintegrating tablet Take 4 mg by mouth daily as needed for nausea or vomiting.    Marland Kitchen PROAIR HFA 108 (90 BASE) MCG/ACT inhaler INHALE 2 PUFFS BY MOUTH FOUR TIMES DAILY AS NEEDED FOR WHEEZING 1 Inhaler 3  . Tiotropium Bromide Monohydrate (SPIRIVA RESPIMAT) 2.5 MCG/ACT AERS 2 pffs each am 1 Inhaler 11  . triamterene-hydrochlorothiazide (MAXZIDE-25) 37.5-25 MG tablet Take 0.5  tablets by mouth daily.     . vitamin B-12 (CYANOCOBALAMIN) 100 MCG tablet Take 100 mcg by mouth daily.     No current facility-administered medications for this visit.    Allergies:   Latex and Penicillins    Social History:  The patient  reports that she quit smoking about 2 years ago. Her smoking use included Cigarettes. She has a 40 pack-year smoking history. She has never used smokeless tobacco. She reports that she drinks alcohol. She reports that she does not use illicit drugs.   Family History:  The patient's family history includes Breast cancer in her cousin; Cancer in her cousin; Diabetes in her father, paternal grandmother, and paternal uncle; Heart Problems in her maternal grandfather, maternal grandmother, and paternal uncle; Heart disease in her maternal aunt; Leukemia in her cousin; Lung cancer (age of onset: 66) in her father; Pancreatic cancer (age of onset: 77) in her mother; Stroke in her paternal grandfather and paternal grandmother. There is no history of Colon cancer or Stomach cancer.    ROS:  Please see the history of present illness.   Otherwise, review of systems are positive for none.   All other systems are reviewed and negative.    PHYSICAL EXAM: VS:  BP 132/70 mmHg  Pulse 79  Ht 5\' 1"  (1.549 m)  Wt 182 lb (82.555 kg)  BMI 34.41 kg/m2  SpO2 97% , BMI Body mass index is 34.41 kg/(m^2). GEN: Well nourished, well developed, in no acute distress HEENT: normal Neck: no JVD, carotid bruits, or masses Cardiac: RRR; no murmurs, rubs, or gallops,no edema  Respiratory:  clear to auscultation bilaterally, normal work of breathing GI: soft, nontender, nondistended, + BS MS: no deformity or atrophy Skin: warm and dry, no rash Neuro:  Strength and sensation are intact Psych: euthymic mood, full affect  EKG:  EKG is ordered today. The ekg ordered today demonstrates SR, normal ECG   Recent Labs: 08/08/2015: Magnesium 1.8 08/31/2015: HGB 12.9; Platelets  240 09/07/2015: ALT 15; BUN 24.9; Creatinine 1.2*; Potassium 4.3; Sodium 139    Lipid Panel No results found for: CHOL, TRIG, HDL, CHOLHDL, VLDL, LDLCALC, LDLDIRECT    Wt Readings from Last 3 Encounters:  10/01/15 182 lb (82.555 kg)  08/31/15 176 lb 14.4 oz (80.241 kg)  08/07/15 176 lb (79.833 kg)    Lexiscan nuclear stress test: 04/2015  Nuclear stress EF: 63%.  There was no ST segment deviation noted during stress.  Defect 1: There is a defect of mild severity present in the apex location. Breast attenuation is present. Fixed defect, no ischemia.   ASSESSMENT AND PLAN:  1.  CAD, Abnormal chest CT with coronary calcifications and DOE - normal Lexiscan nuclear stress test (unable to walk on a treadmill) to assess for significant ischemia. We will restart atorvastatin 10 mg daily and check her CMP and lipids in 2 months.  2. Claudications - check LE arterial  Duplex normal B/L   3. HLP - as above, check CMP and lipids in 2 months.   Follow up in 1 year  Signed, Dorothy Spark, MD  10/01/2015 9:22 AM    [facility] [place], [place], [address]  [address] Phone: [phone]; Fax: [phone]

## 2015-10-04 ENCOUNTER — Other Ambulatory Visit: Payer: Medicare Other

## 2015-10-05 ENCOUNTER — Ambulatory Visit: Payer: Medicare Other | Admitting: Oncology

## 2015-10-06 ENCOUNTER — Emergency Department (HOSPITAL_COMMUNITY)
Admission: EM | Admit: 2015-10-06 | Discharge: 2015-10-07 | Disposition: A | Discharge status: Home / Self Care | Payer: Medicare Other | Attending: Emergency Medicine | Admitting: Emergency Medicine

## 2015-10-06 ENCOUNTER — Encounter (HOSPITAL_COMMUNITY): Payer: Self-pay | Admitting: Emergency Medicine

## 2015-10-06 ENCOUNTER — Ambulatory Visit (HOSPITAL_BASED_OUTPATIENT_CLINIC_OR_DEPARTMENT_OTHER): Payer: Medicare Other

## 2015-10-06 ENCOUNTER — Other Ambulatory Visit: Payer: Medicare Other

## 2015-10-06 DIAGNOSIS — K5902 Outlet dysfunction constipation: Secondary | ICD-10-CM | POA: Insufficient documentation

## 2015-10-06 DIAGNOSIS — Z8543 Personal history of malignant neoplasm of ovary: Secondary | ICD-10-CM | POA: Diagnosis not present

## 2015-10-06 DIAGNOSIS — I10 Essential (primary) hypertension: Secondary | ICD-10-CM | POA: Insufficient documentation

## 2015-10-06 DIAGNOSIS — Z452 Encounter for adjustment and management of vascular access device: Secondary | ICD-10-CM

## 2015-10-06 DIAGNOSIS — Z8669 Personal history of other diseases of the nervous system and sense organs: Secondary | ICD-10-CM | POA: Diagnosis not present

## 2015-10-06 DIAGNOSIS — Z9851 Tubal ligation status: Secondary | ICD-10-CM | POA: Insufficient documentation

## 2015-10-06 DIAGNOSIS — C561 Malignant neoplasm of right ovary: Secondary | ICD-10-CM | POA: Diagnosis not present

## 2015-10-06 DIAGNOSIS — C482 Malignant neoplasm of peritoneum, unspecified: Secondary | ICD-10-CM | POA: Diagnosis not present

## 2015-10-06 DIAGNOSIS — R1030 Lower abdominal pain, unspecified: Secondary | ICD-10-CM

## 2015-10-06 DIAGNOSIS — E538 Deficiency of other specified B group vitamins: Secondary | ICD-10-CM | POA: Diagnosis not present

## 2015-10-06 DIAGNOSIS — Z95828 Presence of other vascular implants and grafts: Secondary | ICD-10-CM

## 2015-10-06 DIAGNOSIS — Z9049 Acquired absence of other specified parts of digestive tract: Secondary | ICD-10-CM | POA: Diagnosis not present

## 2015-10-06 DIAGNOSIS — J439 Emphysema, unspecified: Secondary | ICD-10-CM | POA: Diagnosis not present

## 2015-10-06 DIAGNOSIS — Z872 Personal history of diseases of the skin and subcutaneous tissue: Secondary | ICD-10-CM | POA: Diagnosis not present

## 2015-10-06 DIAGNOSIS — K598 Other specified functional intestinal disorders: Secondary | ICD-10-CM | POA: Diagnosis not present

## 2015-10-06 MED ORDER — SODIUM CHLORIDE 0.9% FLUSH
10.0000 mL | INTRAVENOUS | Status: DC | PRN
  Administered 2015-10-06: 10 mL via INTRAVENOUS
  Filled 2015-10-06: qty 10

## 2015-10-06 MED ORDER — HEPARIN SOD (PORK) LOCK FLUSH 100 UNIT/ML IV SOLN
500.0000 [IU] | Freq: Once | INTRAVENOUS | Status: DC
  Filled 2015-10-06: qty 5

## 2015-10-06 NOTE — Patient Instructions (Signed)

## 2015-10-06 NOTE — ED Notes (Signed)
Pt states that she has ovarian cancer and has had lower abdominal pain all day with constipation. Last chemo in August but CA has returned. Alert and oriented.

## 2015-10-07 ENCOUNTER — Encounter (HOSPITAL_COMMUNITY): Payer: Self-pay

## 2015-10-07 ENCOUNTER — Emergency Department (HOSPITAL_COMMUNITY): Payer: Medicare Other

## 2015-10-07 ENCOUNTER — Telehealth: Payer: Self-pay | Admitting: Oncology

## 2015-10-07 ENCOUNTER — Ambulatory Visit (HOSPITAL_BASED_OUTPATIENT_CLINIC_OR_DEPARTMENT_OTHER): Payer: Medicare Other | Admitting: Oncology

## 2015-10-07 ENCOUNTER — Other Ambulatory Visit: Payer: Self-pay | Admitting: *Deleted

## 2015-10-07 ENCOUNTER — Other Ambulatory Visit: Payer: Medicare Other

## 2015-10-07 VITALS — BP 152/65 | HR 95 | Temp 97.8°F | Resp 18 | Ht 61.0 in | Wt 184.5 lb

## 2015-10-07 DIAGNOSIS — J910 Malignant pleural effusion: Secondary | ICD-10-CM

## 2015-10-07 DIAGNOSIS — C772 Secondary and unspecified malignant neoplasm of intra-abdominal lymph nodes: Secondary | ICD-10-CM

## 2015-10-07 DIAGNOSIS — C561 Malignant neoplasm of right ovary: Secondary | ICD-10-CM

## 2015-10-07 DIAGNOSIS — C562 Malignant neoplasm of left ovary: Secondary | ICD-10-CM | POA: Diagnosis not present

## 2015-10-07 DIAGNOSIS — C569 Malignant neoplasm of unspecified ovary: Secondary | ICD-10-CM

## 2015-10-07 DIAGNOSIS — R1030 Lower abdominal pain, unspecified: Secondary | ICD-10-CM | POA: Diagnosis not present

## 2015-10-07 DIAGNOSIS — C5701 Malignant neoplasm of right fallopian tube: Secondary | ICD-10-CM | POA: Diagnosis not present

## 2015-10-07 DIAGNOSIS — K5902 Outlet dysfunction constipation: Secondary | ICD-10-CM | POA: Diagnosis not present

## 2015-10-07 LAB — COMPREHENSIVE METABOLIC PANEL
ALT: 19 U/L (ref 14–54)
AST: 19 U/L (ref 15–41)
Albumin: 4.1 g/dL (ref 3.5–5.0)
Alkaline Phosphatase: 80 U/L (ref 38–126)
Anion gap: 10 (ref 5–15)
BILIRUBIN TOTAL: 0.8 mg/dL (ref 0.3–1.2)
BUN: 28 mg/dL — AB (ref 6–20)
CALCIUM: 10.1 mg/dL (ref 8.9–10.3)
CO2: 28 mmol/L (ref 22–32)
CREATININE: 1.07 mg/dL — AB (ref 0.44–1.00)
Chloride: 100 mmol/L — ABNORMAL LOW (ref 101–111)
GFR, EST AFRICAN AMERICAN: 59 mL/min — AB (ref >60)
GFR, EST NON AFRICAN AMERICAN: 51 mL/min — AB (ref >60)
Glucose, Bld: 150 mg/dL — ABNORMAL HIGH (ref 65–99)
Potassium: 3.7 mmol/L (ref 3.5–5.1)
Sodium: 138 mmol/L (ref 135–145)
TOTAL PROTEIN: 7.9 g/dL (ref 6.5–8.1)

## 2015-10-07 LAB — LIPASE, BLOOD: Lipase: 27 U/L (ref 11–51)

## 2015-10-07 LAB — URINALYSIS, ROUTINE W REFLEX MICROSCOPIC
Bilirubin Urine: NEGATIVE
GLUCOSE, UA: NEGATIVE mg/dL
Hgb urine dipstick: NEGATIVE
KETONES UR: NEGATIVE mg/dL
NITRITE: NEGATIVE
PH: 5.5 (ref 5.0–8.0)
Protein, ur: NEGATIVE mg/dL
SPECIFIC GRAVITY, URINE: 1.012 (ref 1.005–1.030)

## 2015-10-07 LAB — CBC
HEMATOCRIT: 40.3 % (ref 36.0–46.0)
HEMOGLOBIN: 13.2 g/dL (ref 12.0–15.0)
MCH: 30.4 pg (ref 26.0–34.0)
MCHC: 32.8 g/dL (ref 30.0–36.0)
MCV: 92.9 fL (ref 78.0–100.0)
Platelets: 281 10*3/uL (ref 150–400)
RBC: 4.34 MIL/uL (ref 3.87–5.11)
RDW: 16 % — AB (ref 11.5–15.5)
WBC: 9.6 10*3/uL (ref 4.0–10.5)

## 2015-10-07 LAB — CA 125: Cancer Antigen (CA) 125: 280.3 U/mL — ABNORMAL HIGH (ref 0.0–38.1)

## 2015-10-07 LAB — URINE MICROSCOPIC-ADD ON: RBC / HPF: NONE SEEN RBC/hpf (ref 0–5)

## 2015-10-07 MED ORDER — ONDANSETRON HCL 4 MG/2ML IJ SOLN
4.0000 mg | Freq: Once | INTRAMUSCULAR | Status: AC
  Administered 2015-10-07: 4 mg via INTRAVENOUS
  Filled 2015-10-07: qty 2

## 2015-10-07 MED ORDER — ONDANSETRON 4 MG PO TBDP: 4.0000 mg | ORAL_TABLET | Freq: Every day | ORAL | Status: DC | PRN

## 2015-10-07 MED ORDER — SODIUM CHLORIDE 0.9 % IV SOLN
1000.0000 mL | Freq: Once | INTRAVENOUS | Status: AC
  Administered 2015-10-07: 1000 mL via INTRAVENOUS

## 2015-10-07 MED ORDER — DEXAMETHASONE 4 MG PO TABS: ORAL_TABLET | ORAL | Status: DC

## 2015-10-07 MED ORDER — MORPHINE SULFATE (PF) 4 MG/ML IV SOLN: 4.0000 mg | INTRAVENOUS | Status: DC | PRN

## 2015-10-07 MED ORDER — IOHEXOL 300 MG/ML  SOLN
25.0000 mL | Freq: Once | INTRAMUSCULAR | Status: AC | PRN
  Administered 2015-10-07: 25 mL via ORAL

## 2015-10-07 MED ORDER — DIPHENHYDRAMINE HCL 50 MG/ML IJ SOLN
25.0000 mg | Freq: Once | INTRAMUSCULAR | Status: AC
  Administered 2015-10-07: 25 mg via INTRAVENOUS
  Filled 2015-10-07: qty 1

## 2015-10-07 MED ORDER — MORPHINE SULFATE (PF) 4 MG/ML IV SOLN
6.0000 mg | Freq: Once | INTRAVENOUS | Status: AC
  Administered 2015-10-07: 6 mg via INTRAVENOUS
  Filled 2015-10-07: qty 2

## 2015-10-07 MED ORDER — SODIUM CHLORIDE 0.9 % IV SOLN
1000.0000 mL | INTRAVENOUS | Status: DC
  Administered 2015-10-07: 1000 mL via INTRAVENOUS

## 2015-10-07 MED ORDER — IOPAMIDOL (ISOVUE-300) INJECTION 61%
100.0000 mL | Freq: Once | INTRAVENOUS | Status: AC | PRN
  Administered 2015-10-07: 100 mL via INTRAVENOUS

## 2015-10-07 NOTE — Discharge Instructions (Signed)

## 2015-10-07 NOTE — ED Provider Notes (Signed)
CSN: [id number]     Arrival date & time 10/06/15  2344 History  By signing my name below, I, Hansel Feinstein, attest that this documentation has been prepared under the direction and in the presence of Jola Schmidt, MD. Electronically Signed: Hansel Feinstein, ED Scribe. 10/07/2015. 12:25 AM.    Chief Complaint  Patient presents with  . Cancer  . Abdominal Pain   The history is provided by the patient. No language interpreter was used.   HPI Comments: Lynn Ramirez is a 75 y.o. female with h/o ovarian cancer actively receiving chemo, cholecystectomy in 08/09/15 who presents to the Emergency Department complaining of moderate, constant, waxing and waning lower abdominal pain onset this afternoon with associated constipation, nausea. Pt states she is only able to pass very small amounts of stool, but cannot fully relieve herself. Pt states her last normal BM was 2 days ago, but she had a loose stool yesterday. Pt states she took Miralax and colace with no relief of symptoms today. She reports she also took pain medication with no relief of abdominal pain. Last CT abd/pelv was 08/09/15. She denies fever, chills, emesis, diarrhea.   Past Medical History  Diagnosis Date  . H/O hydronephrosis   . Hypertension   . Emphysema   . COPD (chronic obstructive pulmonary disease) ([place])   . Cancer ([place])     ovarian  . Eczema     hands  . GERD (gastroesophageal reflux disease)   . History of transfusion     age 64  . Difficulty sleeping    Past Surgical History  Procedure Laterality Date  . Nephrectomy    . Tubal ligation    . Hand surgery Right 1980's  . Thoracentesis      several  . Robotic assisted total hysterectomy with bilateral salpingo oopherectomy Bilateral 12/29/2014    Procedure: ROBOTIC ASSISTED TOTAL LAPAROSCOPIC HYSTERECTOMY WITH BILATERAL SALPINGO OOPHORECTOMY AND OOMENTECTOMY WITH RADICAL TUMOR [place] ;  Surgeon: Everitt Amber, MD;  [facility];  Service: Gynecology;  Laterality:  Bilateral;  . Laparotomy N/A 12/29/2014    Procedure:  LAPAROTOMY;  Surgeon: Everitt Amber, MD;  [facility];  Service: Gynecology;  Laterality: N/A;  . Tonsillectomy    . Cholecystectomy N/A 08/09/2015    Procedure: Attempted LAPAROSCOPIC coverted  open CHOLECYSTECTOMY WITH INTRAOPERATIVE CHOLANGIOGRAM;  Surgeon: Autumn Messing III, MD;  Location[facility];  Service: General;  Laterality: N/A;   Family History  Problem Relation Age of Onset  . Diabetes Father   . Lung cancer Father 21    metastasis to liver  . Pancreatic cancer Mother 69  . Stroke Paternal Grandfather   . Colon cancer Neg Hx   . Stomach cancer Neg Hx   . Heart disease Maternal Aunt   . Diabetes Paternal Uncle   . Heart Problems Maternal Grandmother   . Heart Problems Maternal Grandfather   . Diabetes Paternal Grandmother   . Stroke Paternal Grandmother   . Heart Problems Paternal Uncle   . Cancer Cousin     unknown type  . Breast cancer Cousin     dx. 32s  . Leukemia Cousin     dx. 16-17   Social History  Substance Use Topics  . Smoking status: Former Smoker -- 1.00 packs/day for 40 years    Types: Cigarettes    Quit date: 07/10/2013  . Smokeless tobacco: Never Used  . Alcohol Use: 0.0 oz/week    0 Standard drinks or equivalent per week  Comment: social    OB History    No data available     Review of Systems A complete 10 system review of systems was obtained and all systems are negative except as noted in the HPI and PMH.    Allergies  Latex and Penicillins  Home Medications   Prior to Admission medications   Medication Sig Start Date End Date Taking? Authorizing Provider  Acetaminophen (TYLENOL) 325 MG CAPS Take 1 tablet by mouth 3 (three) times daily as needed.    Historical Provider, MD  antiseptic oral rinse (BIOTENE) LIQD 15 mLs by Mouth Rinse route 3 (three) times daily.    Historical Provider, MD  atorvastatin (LIPITOR) 10 MG tablet Take 10 mg by mouth daily.    Historical Provider, MD   Biotin 2500 MCG CAPS Take 1 tablet by mouth daily.    Historical Provider, MD  budesonide-formoterol (SYMBICORT) 160-4.5 MCG/ACT inhaler Inhale 2 puffs into the lungs 2 (two) times daily. 09/24/15   Collene Gobble, MD  Cyanocobalamin (B-12 IJ) Inject as directed once a week.    Historical Provider, MD  fluocinonide cream (LIDEX) [id number] % Apply 1 application topically 2 (two) times daily as needed (For ezcema).     Historical Provider, MD  lidocaine-prilocaine (EMLA) cream Apply to port site one hour prior to use. Do not rub in. Cover with plastic. 10/28/14   Ladell Pier, MD  Misc Natural Products (COLON CLEANSE) CAPS Take 1 capsule by mouth daily.    Historical Provider, MD  ondansetron (ZOFRAN-ODT) 4 MG disintegrating tablet Take 4 mg by mouth daily as needed for nausea or vomiting.    Historical Provider, MD  PROAIR HFA 108 (90 BASE) MCG/ACT inhaler INHALE 2 PUFFS BY MOUTH FOUR TIMES DAILY AS NEEDED FOR WHEEZING 07/08/14   Kathee Delton, MD  Tiotropium Bromide Monohydrate (SPIRIVA RESPIMAT) 2.5 MCG/ACT AERS 2 pffs each am 07/14/15   Tanda Rockers, MD  triamterene-hydrochlorothiazide (MAXZIDE-25) 37.5-25 MG tablet Take 0.5 tablets by mouth daily.     Historical Provider, MD  vitamin B-12 (CYANOCOBALAMIN) 100 MCG tablet Take 100 mcg by mouth daily.    Historical Provider, MD   BP 135/64 mmHg  Pulse 93  Temp(Src) 97.8 F (36.6 C) (Oral)  Resp 18  SpO2 94% Physical Exam  Constitutional: She is oriented to person, place, and time. She appears well-developed and well-nourished. No distress.  HENT:  Head: Normocephalic and atraumatic.  Eyes: EOM are normal.  Neck: Normal range of motion.  Cardiovascular: Normal rate, regular rhythm and normal heart sounds.   Pulmonary/Chest: Effort normal and breath sounds normal.  Abdominal: Soft. She exhibits no distension. There is tenderness.  Mild generalized lower abdominal tenderness.   Genitourinary:  Chaperone present throughout entire exam. Normal  rectal exam. No stool impaction.   Musculoskeletal: Normal range of motion.  Neurological: She is alert and oriented to person, place, and time.  Skin: Skin is warm and dry.  Psychiatric: She has a normal mood and affect. Judgment normal.  Nursing note and vitals reviewed.   ED Course  Procedures (including critical care time) DIAGNOSTIC STUDIES: Oxygen Saturation is 94% on RA, adequate by my interpretation.    COORDINATION OF CARE: 12:21 AM Discussed treatment plan with pt at bedside which includes lab work, CT abd/pelv and pt agreed to plan.   Labs Review Labs Reviewed  COMPREHENSIVE METABOLIC PANEL - Abnormal; Notable for the following:    Chloride 100 (*)    Glucose, Bld 150 (*)  BUN 28 (*)    Creatinine, Ser 1.07 (*)    GFR calc non Af Amer 51 (*)    GFR calc Af Amer 59 (*)    All other components within normal limits  CBC - Abnormal; Notable for the following:    RDW 16.0 (*)    All other components within normal limits  URINALYSIS, ROUTINE W REFLEX MICROSCOPIC (NOT AT [facility]) - Abnormal; Notable for the following:    Leukocytes, UA TRACE (*)    All other components within normal limits  URINE MICROSCOPIC-ADD ON - Abnormal; Notable for the following:    Squamous Epithelial / LPF 6-30 (*)    Bacteria, UA RARE (*)    All other components within normal limits  LIPASE, BLOOD    Imaging Review Ct Abdomen Pelvis W Contrast  10/07/2015  CLINICAL DATA:  75 year old female with ovarian cancer presenting with worsening lower abdominal pain. EXAM: CT ABDOMEN AND PELVIS WITH CONTRAST TECHNIQUE: Multidetector CT imaging of the abdomen and pelvis was performed using the standard protocol following bolus administration of intravenous contrast. CONTRAST:  167mL ISOVUE-300 IOPAMIDOL (ISOVUE-300) INJECTION 61% COMPARISON:  CT dated 08/07/2015 FINDINGS: The visualized lung bases are clear. Mild right pleural based thickening/scarring. No intra-abdominal free air or free fluid.  Cholecystectomy. Small amount of fluid is noted in the subhepatic area adjacent to the cholecystectomy clips. This may represent a small fluid collection with possible superimposed infection or leakage of bile. Mild fatty infiltration of the liver. There is mild intrahepatic biliary ductal dilatation, possibly post cholecystectomy. The pancreas, spleen, and adrenal glands appear unremarkable. There is a solitary right kidney. An extrarenal pelvis noted on the right. There is mild right hydronephrosis similar to prior study. No calcified stone identified. A focal area of stricture at the right UPJ is not excluded. The right ureter and urinary bladder appear unremarkable. There is hypo enhancement of the right renal parenchyma possibly related to hydronephrosis. Correlation with urinalysis recommended to exclude UTI. Hysterectomy. There is postsurgical changes and scarring within the pelvis. There is a 1 cm calcific density in the right hemipelvis which may be dystrophic calcification and related to prior inflammation or a diverticulith. There is a focal area of persistent narrowing in the sigmoid colon adjacent to this calcification likely related to underlying adhesion or stricture. Large amount of stool noted throughout the colon proximal to this point suggestive of a degree of luminal narrowing and stricture. There is sigmoid diverticulosis and scattered colonic diverticula without active inflammatory changes. There is no evidence of small-bowel obstruction. There is abutment of multiple loops of small bowel to the anterior peritoneal wall compatible with adhesions. The appendix is not visualized and may be surgically absent. There is mild diffuse nodularity of the omentum predominantly in the left upper abdomen (series 2, image 26). Early omental implants are not excluded. There is diastases of anterior abdominal wall musculature with a broad-based shallow hernia with abutment of short segment of transverse colon  and distal stomach to the anterior peritoneal wall. There is aortoiliac atherosclerotic disease. No portal venous gas identified. There is no adenopathy. There is degenerative changes of the spine. No acute fracture. IMPRESSION: Cholecystectomy with small amount of fluid adjacent to the cholecystectomy bed may represent an infected fluid or bile leak. Clinical correlation is recommended. A hepatobiliary scintigraphy may provide additional information if a bowel leak is clinically suspected. Postsurgical changes of hysterectomy with scarring in the pelvis. Focal area of persistent narrowing of the sigmoid colon in this region  is concerning for stricture possibly related to adhesion with associated mild obstruction. Large amount of stool noted proximal to point. No evidence of small-bowel obstruction. Colonic diverticulosis without active inflammation. Stable appearing mild right hydronephrosis. Underlying UPJ stricture is not excluded. Electronically Signed   By: Anner Crete M.D.   On: 10/07/2015 02:50   I have personally reviewed and evaluated these images and lab results as part of my medical decision-making.   MDM   Final diagnoses:  Lower abdominal pain  Constipation due to outlet dysfunction   Patient overall feels much better at this time.  She does have an area of focal persistent narrowing of the sigmoid colon which could be causing some of her constipation.  She does have stool proximal to this.  She has been made aware of this and she'll need to follow-up with her oncology team as well as outpatient GI.  If this continues to be an issue she may require adhesion takedown from a surgical standpoint.  She is also made aware of the possible omental implants.  She is scheduled to see oncology later today.  She feels better.  No indication for additional treatment or management in the emergency department tonight.  Discharge home in good condition.  I personally performed the services described  in this documentation, which was scribed in my presence. The recorded information has been reviewed and is accurate.       Jola Schmidt, MD 10/07/15 [phone]

## 2015-10-07 NOTE — Progress Notes (Signed)
[place] OFFICE PROGRESS NOTE   Diagnosis: Ovarian cancer  INTERVAL HISTORY:   Lynn Ramirez returns as scheduled. She reports "indigestion" for the past 3 weeks, relieved with Pepcid. She developed acute low abdominal pain yesterday and was evaluated in the emergency room. She had been constipated. A CT in the emergency room revealed stable mild right hydronephrosis. A focal area of persistent narrowing was noted at the sigmoid colon adjacent to a calcification. A large amount of stool was noted throughout the colon. No small bowel obstruction. Mild diffuse nodularity of the omentum, chiefly in the left upper abdomen. She feels better today. No fever.  Objective:  Vital signs in last 24 hours:  Blood pressure 152/65, pulse 95, temperature 97.8 F (36.6 C), temperature source Oral, resp. rate 18, height 5\' 1"  (1.549 m), weight 184 lb 8 oz (83.689 kg), SpO2 92 %.    HEENT: Neck without mass Lymphatics: No cervical, supraclavicular, or inguinal nodes Resp: Lungs clear bilaterally Cardio: Regular rate and rhythm GI: No hepatomegaly, no mass, no apparent ascites, nontender Vascular: No leg edema  Portacath/PICC-without erythema  Lab Results:  Lab Results  Component Value Date   WBC 9.6 10/07/2015   HGB 13.2 10/07/2015   HCT 40.3 10/07/2015   MCV 92.9 10/07/2015   PLT 281 10/07/2015   NEUTROABS 4.6 08/31/2015   CA 125-280  Imaging:  Ct Abdomen Pelvis W Contrast  10/07/2015  CLINICAL DATA:  75 year old female with ovarian cancer presenting with worsening lower abdominal pain. EXAM: CT ABDOMEN AND PELVIS WITH CONTRAST TECHNIQUE: Multidetector CT imaging of the abdomen and pelvis was performed using the standard protocol following bolus administration of intravenous contrast. CONTRAST:  12mL ISOVUE-300 IOPAMIDOL (ISOVUE-300) INJECTION 61% COMPARISON:  CT dated 08/07/2015 FINDINGS: The visualized lung bases are clear. Mild right pleural based  thickening/scarring. No intra-abdominal free air or free fluid. Cholecystectomy. Small amount of fluid is noted in the subhepatic area adjacent to the cholecystectomy clips. This may represent a small fluid collection with possible superimposed infection or leakage of bile. Mild fatty infiltration of the liver. There is mild intrahepatic biliary ductal dilatation, possibly post cholecystectomy. The pancreas, spleen, and adrenal glands appear unremarkable. There is a solitary right kidney. An extrarenal pelvis noted on the right. There is mild right hydronephrosis similar to prior study. No calcified stone identified. A focal area of stricture at the right UPJ is not excluded. The right ureter and urinary bladder appear unremarkable. There is hypo enhancement of the right renal parenchyma possibly related to hydronephrosis. Correlation with urinalysis recommended to exclude UTI. Hysterectomy. There is postsurgical changes and scarring within the pelvis. There is a 1 cm calcific density in the right hemipelvis which may be dystrophic calcification and related to prior inflammation or a diverticulith. There is a focal area of persistent narrowing in the sigmoid colon adjacent to this calcification likely related to underlying adhesion or stricture. Large amount of stool noted throughout the colon proximal to this point suggestive of a degree of luminal narrowing and stricture. There is sigmoid diverticulosis and scattered colonic diverticula without active inflammatory changes. There is no evidence of small-bowel obstruction. There is abutment of multiple loops of small bowel to the anterior peritoneal wall compatible with adhesions. The appendix is not visualized and may be surgically absent. There is mild diffuse nodularity of the omentum predominantly in the left upper abdomen (series 2, image 26). Early omental implants are not excluded. There is diastases of anterior abdominal wall musculature with a broad-based  shallow hernia with abutment of short segment of transverse colon and distal stomach to the anterior peritoneal wall. There is aortoiliac atherosclerotic disease. No portal venous gas identified. There is no adenopathy. There is degenerative changes of the spine. No acute fracture. IMPRESSION: Cholecystectomy with small amount of fluid adjacent to the cholecystectomy bed may represent an infected fluid or bile leak. Clinical correlation is recommended. A hepatobiliary scintigraphy may provide additional information if a bowel leak is clinically suspected. Postsurgical changes of hysterectomy with scarring in the pelvis. Focal area of persistent narrowing of the sigmoid colon in this region is concerning for stricture possibly related to adhesion with associated mild obstruction. Large amount of stool noted proximal to point. No evidence of small-bowel obstruction. Colonic diverticulosis without active inflammation. Stable appearing mild right hydronephrosis. Underlying UPJ stricture is not excluded. Electronically Signed   By: Anner Crete M.D.   On: 10/07/2015 02:50   CT images were reviewed Medications: I have reviewed the patient's current medications.  Assessment/Plan: 1. 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, immunohistochemical profile consistent with a GYN primary, elevated CA 125  Staging CTs of the chest, abdomen, and pelvis on 10/06/2014 revealed a loculated right pleural effusion, ascites, and omental/mesenteric thickening  Cytology from peritoneal fluid 10/13/2014 revealed malignant cells consistent with metastatic adenocarcinoma  Biopsy of an omental mass on 11/02/2014 revealed invasive serous carcinoma with psammoma bodies  Cycle 1 Taxol/carboplatin 10/28/2014  Cycle 2 Taxol/carboplatin 11/18/2014  Cycle 3 Taxol/carboplatin 12/09/2014  CT scan 12/23/2014 with interval improvement in peritoneal carcinomatosis with  near-complete resolution of ascites and decreased omental nodularity. Significant improvement in malignant right pleural effusion.  Status post robotic-assisted laparoscopic hysterectomy with bilateral salpingoophorectomy, omentectomy, radical tumor debulking 12/29/2014. Per Dr. Serita Grit office note 01/14/2015 cytoreduction was optimal with residual disease remaining only in the 1 mm implants on the small intestine. 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  Cycle 1 adjuvant Taxol/carboplatin 01/20/2015  Cycle 2 adjuvant Taxol/carboplatin 02/10/2015  Cycle 3 adjuvant Taxol/carboplatin 03/10/2015  CA 125 on 1013 2016-42  CTs of the chest, abdomen, and pelvis 05/31/2015 with no evidence of progressive ovarian cancer  CTs of the chest, abdomen, and pelvis 08/07/2015 and 08/08/2015-no evidence of progressive ovarian cancer  Peritoneal studding noted at the time of the cholecystectomy procedure 08/09/2015  Elevated CA 125 10/07/2015  CT 10/07/2015 with stricturing at the sigmoid colon, constipation, and omental nodularity 2. COPD 3. Dyspnea secondary to COPD and the large right pleural effusion, status post therapeutic thoracentesis procedures 09/30/2014,10/09/2014, and 10/21/2014. Left thoracentesis 10/30/2014 4. Left nephrectomy as a child 5. Delayed nausea following cycle 1 Taxol/carboplatin, Aloxi/Emend added with cycle 2 with improvement. 6. Right lower extremity edema, right calf pain 12/09/2014. Negative venous Doppler 12/09/2014. 7. Diffuse pruritus following cycle 1 adjuvant Taxol/carboplatin 01/20/2015, no rash, resolved with a steroid dose pack 8. Thrombocytopenia second to chemotherapy, the  carboplatin was dose reduced with cycle 2 adjuvant Taxol/carboplatin 02/10/2015 9. Chemotherapy-induced peripheral neuropathy-improved 10. Admission with acute cholecystitis 08/07/2015, status post a cholecystectomy 08/09/2015 11. Presentation to the emergency room 10/06/2015 with abdominal pain and constipation   Disposition:  Mr. [name] has a history of advanced ovarian cancer. The CA 125 is higher and there is clinical evidence of disease progression. I suspect the constipation and abdominal  discomfort are related to carcinomatosis. I recommended she begin a stool softener and MiraLAX. She will follow a low residual diet.  We discussed salvage treatment options. I recommend weekly Taxol. We reviewed the potential for an allergic reaction, alopecia, hematologic toxicity, and neuropathy. She agrees to proceed.  She will return for weekly Taxol on 10/13/2015.  She will contact us for recurrent obstructive symptoms.  Betsy Coder, MD  10/07/2015  10:54 AM

## 2015-10-07 NOTE — Telephone Encounter (Signed)
Gave and printed appt sched and avs for pt for April °

## 2015-10-07 NOTE — ED Notes (Signed)
Bed: [id number] Expected date:  Expected time:  Means of arrival:  Comments: Cleaning  and changing

## 2015-10-08 ENCOUNTER — Encounter (HOSPITAL_COMMUNITY): Payer: Self-pay | Admitting: *Deleted

## 2015-10-08 ENCOUNTER — Observation Stay (HOSPITAL_COMMUNITY)
Admission: EM | Admit: 2015-10-08 | Discharge: 2015-10-11 | Disposition: A | Discharge status: Home / Self Care | Payer: Medicare Other | Attending: Internal Medicine | Admitting: Internal Medicine

## 2015-10-08 ENCOUNTER — Inpatient Hospital Stay: Admission: AD | Admit: 2015-10-08 | Payer: Medicare Other | Source: Ambulatory Visit | Admitting: Oncology

## 2015-10-08 ENCOUNTER — Ambulatory Visit (HOSPITAL_COMMUNITY)
Admission: RE | Admit: 2015-10-08 | Discharge: 2015-10-08 | Disposition: A | Discharge status: Home / Self Care | Payer: Medicare Other | Source: Ambulatory Visit | Attending: Oncology | Admitting: Oncology

## 2015-10-08 ENCOUNTER — Other Ambulatory Visit: Payer: Self-pay | Admitting: *Deleted

## 2015-10-08 ENCOUNTER — Ambulatory Visit (HOSPITAL_BASED_OUTPATIENT_CLINIC_OR_DEPARTMENT_OTHER): Payer: Medicare Other | Admitting: Nurse Practitioner

## 2015-10-08 VITALS — BP 146/76 | HR 102 | Temp 98.3°F | Resp 17 | Ht 61.0 in | Wt 181.3 lb

## 2015-10-08 DIAGNOSIS — C482 Malignant neoplasm of peritoneum, unspecified: Secondary | ICD-10-CM

## 2015-10-08 DIAGNOSIS — K56600 Partial intestinal obstruction, unspecified as to cause: Secondary | ICD-10-CM

## 2015-10-08 DIAGNOSIS — I10 Essential (primary) hypertension: Secondary | ICD-10-CM | POA: Diagnosis not present

## 2015-10-08 DIAGNOSIS — Z87891 Personal history of nicotine dependence: Secondary | ICD-10-CM | POA: Insufficient documentation

## 2015-10-08 DIAGNOSIS — R112 Nausea with vomiting, unspecified: Secondary | ICD-10-CM | POA: Insufficient documentation

## 2015-10-08 DIAGNOSIS — Z91040 Latex allergy status: Secondary | ICD-10-CM | POA: Diagnosis not present

## 2015-10-08 DIAGNOSIS — C561 Malignant neoplasm of right ovary: Secondary | ICD-10-CM

## 2015-10-08 DIAGNOSIS — K5669 Other intestinal obstruction: Secondary | ICD-10-CM | POA: Diagnosis not present

## 2015-10-08 DIAGNOSIS — Z859 Personal history of malignant neoplasm, unspecified: Secondary | ICD-10-CM | POA: Insufficient documentation

## 2015-10-08 DIAGNOSIS — C786 Secondary malignant neoplasm of retroperitoneum and peritoneum: Secondary | ICD-10-CM | POA: Diagnosis not present

## 2015-10-08 DIAGNOSIS — G479 Sleep disorder, unspecified: Secondary | ICD-10-CM | POA: Diagnosis not present

## 2015-10-08 DIAGNOSIS — Z880 Allergy status to penicillin: Secondary | ICD-10-CM | POA: Diagnosis not present

## 2015-10-08 DIAGNOSIS — C569 Malignant neoplasm of unspecified ovary: Secondary | ICD-10-CM

## 2015-10-08 DIAGNOSIS — R1084 Generalized abdominal pain: Secondary | ICD-10-CM | POA: Diagnosis not present

## 2015-10-08 DIAGNOSIS — J439 Emphysema, unspecified: Secondary | ICD-10-CM | POA: Diagnosis not present

## 2015-10-08 DIAGNOSIS — R109 Unspecified abdominal pain: Secondary | ICD-10-CM | POA: Diagnosis not present

## 2015-10-08 DIAGNOSIS — R110 Nausea: Secondary | ICD-10-CM | POA: Diagnosis not present

## 2015-10-08 DIAGNOSIS — K56609 Unspecified intestinal obstruction, unspecified as to partial versus complete obstruction: Secondary | ICD-10-CM

## 2015-10-08 DIAGNOSIS — Z872 Personal history of diseases of the skin and subcutaneous tissue: Secondary | ICD-10-CM | POA: Diagnosis not present

## 2015-10-08 DIAGNOSIS — C799 Secondary malignant neoplasm of unspecified site: Secondary | ICD-10-CM

## 2015-10-08 DIAGNOSIS — C5701 Malignant neoplasm of right fallopian tube: Secondary | ICD-10-CM

## 2015-10-08 DIAGNOSIS — Z79899 Other long term (current) drug therapy: Secondary | ICD-10-CM | POA: Insufficient documentation

## 2015-10-08 DIAGNOSIS — K219 Gastro-esophageal reflux disease without esophagitis: Secondary | ICD-10-CM | POA: Diagnosis not present

## 2015-10-08 DIAGNOSIS — Z87448 Personal history of other diseases of urinary system: Secondary | ICD-10-CM | POA: Diagnosis not present

## 2015-10-08 DIAGNOSIS — Z7951 Long term (current) use of inhaled steroids: Secondary | ICD-10-CM | POA: Insufficient documentation

## 2015-10-08 DIAGNOSIS — K5900 Constipation, unspecified: Secondary | ICD-10-CM | POA: Diagnosis not present

## 2015-10-08 DIAGNOSIS — C562 Malignant neoplasm of left ovary: Secondary | ICD-10-CM

## 2015-10-08 DIAGNOSIS — K56699 Other intestinal obstruction unspecified as to partial versus complete obstruction: Secondary | ICD-10-CM

## 2015-10-08 LAB — CBC WITH DIFFERENTIAL/PLATELET
BASOS ABS: 0 10*3/uL (ref 0.0–0.1)
BASOS PCT: 0 %
EOS PCT: 0 %
Eosinophils Absolute: 0 10*3/uL (ref 0.0–0.7)
HEMATOCRIT: 41.2 % (ref 36.0–46.0)
Hemoglobin: 13 g/dL (ref 12.0–15.0)
Lymphocytes Relative: 5 %
Lymphs Abs: 0.7 10*3/uL (ref 0.7–4.0)
MCH: 29.6 pg (ref 26.0–34.0)
MCHC: 31.6 g/dL (ref 30.0–36.0)
MCV: 93.8 fL (ref 78.0–100.0)
MONO ABS: 0.4 10*3/uL (ref 0.1–1.0)
MONOS PCT: 3 %
Neutro Abs: 12 10*3/uL — ABNORMAL HIGH (ref 1.7–7.7)
Neutrophils Relative %: 92 %
PLATELETS: 233 10*3/uL (ref 150–400)
RBC: 4.39 MIL/uL (ref 3.87–5.11)
RDW: 16.2 % — AB (ref 11.5–15.5)
WBC: 13.1 10*3/uL — ABNORMAL HIGH (ref 4.0–10.5)

## 2015-10-08 LAB — COMPREHENSIVE METABOLIC PANEL
ALT: 16 U/L (ref 14–54)
ANION GAP: 10 (ref 5–15)
AST: 15 U/L (ref 15–41)
Albumin: 3.7 g/dL (ref 3.5–5.0)
Alkaline Phosphatase: 74 U/L (ref 38–126)
BILIRUBIN TOTAL: 0.9 mg/dL (ref 0.3–1.2)
BUN: 21 mg/dL — AB (ref 6–20)
CHLORIDE: 95 mmol/L — AB (ref 101–111)
CO2: 28 mmol/L (ref 22–32)
Calcium: 9.4 mg/dL (ref 8.9–10.3)
Creatinine, Ser: 1.1 mg/dL — ABNORMAL HIGH (ref 0.44–1.00)
GFR calc Af Amer: 57 mL/min — ABNORMAL LOW (ref >60)
GFR, EST NON AFRICAN AMERICAN: 49 mL/min — AB (ref >60)
Glucose, Bld: 141 mg/dL — ABNORMAL HIGH (ref 65–99)
POTASSIUM: 3.6 mmol/L (ref 3.5–5.1)
Sodium: 133 mmol/L — ABNORMAL LOW (ref 135–145)
TOTAL PROTEIN: 7.2 g/dL (ref 6.5–8.1)

## 2015-10-08 LAB — URINALYSIS, ROUTINE W REFLEX MICROSCOPIC
Bilirubin Urine: NEGATIVE
Glucose, UA: NEGATIVE mg/dL
Hgb urine dipstick: NEGATIVE
KETONES UR: NEGATIVE mg/dL
LEUKOCYTES UA: NEGATIVE
NITRITE: NEGATIVE
PROTEIN: NEGATIVE mg/dL
Specific Gravity, Urine: 1.012 (ref 1.005–1.030)
pH: 5 (ref 5.0–8.0)

## 2015-10-08 LAB — LIPASE, BLOOD: LIPASE: 20 U/L (ref 11–51)

## 2015-10-08 MED ORDER — MOMETASONE FURO-FORMOTEROL FUM 200-5 MCG/ACT IN AERO
2.0000 | INHALATION_SPRAY | Freq: Two times a day (BID) | RESPIRATORY_TRACT | Status: DC
  Filled 2015-10-08: qty 8.8

## 2015-10-08 MED ORDER — MORPHINE SULFATE (PF) 2 MG/ML IV SOLN
2.0000 mg | INTRAVENOUS | Status: DC | PRN
  Administered 2015-10-08 – 2015-10-09 (×3): 2 mg via INTRAVENOUS
  Filled 2015-10-08 (×3): qty 1

## 2015-10-08 MED ORDER — ONDANSETRON HCL 4 MG/2ML IJ SOLN
4.0000 mg | Freq: Once | INTRAMUSCULAR | Status: AC
  Administered 2015-10-08: 4 mg via INTRAVENOUS
  Filled 2015-10-08: qty 2

## 2015-10-08 MED ORDER — FAMOTIDINE 20 MG PO TABS
20.0000 mg | ORAL_TABLET | Freq: Every day | ORAL | Status: DC
  Administered 2015-10-09 – 2015-10-11 (×3): 20 mg via ORAL
  Filled 2015-10-08 (×3): qty 1

## 2015-10-08 MED ORDER — TIOTROPIUM BROMIDE MONOHYDRATE 18 MCG IN CAPS
18.0000 ug | ORAL_CAPSULE | Freq: Every day | RESPIRATORY_TRACT | Status: DC
  Administered 2015-10-09 – 2015-10-11 (×3): 18 ug via RESPIRATORY_TRACT
  Filled 2015-10-08: qty 5

## 2015-10-08 MED ORDER — ACETAMINOPHEN 325 MG PO TABS
650.0000 mg | ORAL_TABLET | Freq: Four times a day (QID) | ORAL | Status: DC | PRN
  Administered 2015-10-10: 650 mg via ORAL
  Filled 2015-10-08: qty 2

## 2015-10-08 MED ORDER — MORPHINE SULFATE (PF) 4 MG/ML IV SOLN
4.0000 mg | Freq: Once | INTRAVENOUS | Status: AC
  Administered 2015-10-08: 4 mg via INTRAVENOUS
  Filled 2015-10-08: qty 1

## 2015-10-08 MED ORDER — ONDANSETRON HCL 4 MG/2ML IJ SOLN
4.0000 mg | Freq: Four times a day (QID) | INTRAMUSCULAR | Status: DC | PRN
  Filled 2015-10-08: qty 2

## 2015-10-08 MED ORDER — ENOXAPARIN SODIUM 40 MG/0.4ML ~~LOC~~ SOLN
40.0000 mg | SUBCUTANEOUS | Status: DC
  Administered 2015-10-09 – 2015-10-10 (×2): 40 mg via SUBCUTANEOUS
  Filled 2015-10-08 (×2): qty 0.4

## 2015-10-08 MED ORDER — SODIUM CHLORIDE 0.9 % IV SOLN: INTRAVENOUS | Status: DC

## 2015-10-08 MED ORDER — ATORVASTATIN CALCIUM 10 MG PO TABS
10.0000 mg | ORAL_TABLET | Freq: Every day | ORAL | Status: DC
  Administered 2015-10-08 – 2015-10-11 (×4): 10 mg via ORAL
  Filled 2015-10-08 (×4): qty 1

## 2015-10-08 MED ORDER — ALBUTEROL SULFATE (2.5 MG/3ML) 0.083% IN NEBU: 2.5000 mg | INHALATION_SOLUTION | Freq: Four times a day (QID) | RESPIRATORY_TRACT | Status: DC | PRN

## 2015-10-08 MED ORDER — POTASSIUM CHLORIDE IN NACL 20-0.9 MEQ/L-% IV SOLN
INTRAVENOUS | Status: AC
  Administered 2015-10-08: 21:00:00 via INTRAVENOUS
  Filled 2015-10-08: qty 1000

## 2015-10-08 MED ORDER — ONDANSETRON HCL 4 MG PO TABS
4.0000 mg | ORAL_TABLET | Freq: Four times a day (QID) | ORAL | Status: DC | PRN
  Administered 2015-10-10: 4 mg via ORAL
  Filled 2015-10-08: qty 1

## 2015-10-08 MED ORDER — SODIUM CHLORIDE 0.9 % IV BOLUS (SEPSIS)
1000.0000 mL | Freq: Once | INTRAVENOUS | Status: AC
  Administered 2015-10-08: 1000 mL via INTRAVENOUS

## 2015-10-08 MED ORDER — NON FORMULARY: Freq: Every morning | Status: DC

## 2015-10-08 MED ORDER — VITAMIN B-12 100 MCG PO TABS
100.0000 ug | ORAL_TABLET | Freq: Every day | ORAL | Status: DC
  Administered 2015-10-09 – 2015-10-11 (×3): 100 ug via ORAL
  Filled 2015-10-08 (×3): qty 1

## 2015-10-08 MED ORDER — BIOTENE DRY MOUTH MT LIQD
15.0000 mL | Freq: Three times a day (TID) | OROMUCOSAL | Status: DC
  Administered 2015-10-08 – 2015-10-11 (×5): 15 mL via OROMUCOSAL

## 2015-10-08 MED ORDER — ACETAMINOPHEN 650 MG RE SUPP: 650.0000 mg | Freq: Four times a day (QID) | RECTAL | Status: DC | PRN

## 2015-10-08 MED ORDER — BUDESONIDE-FORMOTEROL FUMARATE 160-4.5 MCG/ACT IN AERO
2.0000 | INHALATION_SPRAY | Freq: Two times a day (BID) | RESPIRATORY_TRACT | Status: DC
  Administered 2015-10-09 – 2015-10-11 (×5): 2 via RESPIRATORY_TRACT
  Filled 2015-10-08: qty 6

## 2015-10-08 MED ORDER — SORBITOL 70 % SOLN
960.0000 mL | TOPICAL_OIL | Freq: Once | ORAL | Status: AC
  Administered 2015-10-08: 960 mL via RECTAL
  Filled 2015-10-08: qty 240

## 2015-10-08 MED ORDER — TIOTROPIUM BROMIDE MONOHYDRATE 2.5 MCG/ACT IN AERS: 2.0000 | INHALATION_SPRAY | Freq: Every morning | RESPIRATORY_TRACT | Status: DC

## 2015-10-08 NOTE — ED Notes (Signed)
Bed: WA07 Expected date:  Expected time:  Means of arrival:  Comments: Ca pt ? bowel obst

## 2015-10-08 NOTE — ED Notes (Signed)
Pt reports pain has returned since she got up to use the bathroom.

## 2015-10-08 NOTE — Progress Notes (Signed)
Received call from pt stating that her abdominal pain has increased today from yesterday.  She states that she is "constipated" and "doubled over in pain".  She gave herself a fleets enema approx 2 hrs ago with little results. She drank hot tea only today and had emesis after drinking it.  She has taken Miralax and 3 Colace tablets today with no relief of constipation.  Dr. Benay Spice notified of above and he would like for pt to come in to be seen today as soon as possible.  L. Marcello Moores NP will see pt.  Call placed to pt and she is agreeable to coming in to be seen today.  Dr. Benay Spice would like pt to have 2 view abd prior to visit with L. Thomas.  Order placed and radiology notified.  Call placed to pt to notify her of xray prior to NP visit.

## 2015-10-08 NOTE — ED Provider Notes (Signed)
CSN: [id number]     Arrival date & time 10/08/15  1637 History   First MD Initiated Contact with Patient 10/08/15 1639     Chief Complaint  Patient presents with  . Abdominal Pain     (Consider location/radiation/quality/duration/timing/severity/associated sxs/prior Treatment) HPI Comments: Wednesday came in for pain, nausea, had CT and improved.  Constipation secondary to sigmoid narrowing Miralax 1 per day, colace x3, enema x1, since Wednesday Saw Oncologist yesterday  Lower abdominal pain, severe, spasms, severe pain, started Wednesday, became severe Wed night--yesterday was improved then today severe again  After fleets enema had very small amount of stool, barely anything  Throw up everything trying to drink Tried zofran, didn't help Dark emesis "bile", not sure exact color  No fevers, no pain RUQ Emesis started today Had pain medication rx but didn't want to take due to constipation however pain very severe  Monday/Tuesday had normal BM       Patient is a 75 y.o. female presenting with abdominal pain.  Abdominal Pain Associated symptoms: constipation, nausea and vomiting   Associated symptoms: no chest pain, no cough, no diarrhea, no fever, no shortness of breath and no sore throat     Past Medical History  Diagnosis Date  . H/O hydronephrosis   . Hypertension   . Emphysema   . COPD (chronic obstructive pulmonary disease) ([place])   . Cancer ([place])     ovarian  . Eczema     hands  . GERD (gastroesophageal reflux disease)   . History of transfusion     age 20  . Difficulty sleeping    Past Surgical History  Procedure Laterality Date  . Nephrectomy    . Tubal ligation    . Hand surgery Right 1980's  . Thoracentesis      several  . Robotic assisted total hysterectomy with bilateral salpingo oopherectomy Bilateral 12/29/2014    Procedure: ROBOTIC ASSISTED TOTAL LAPAROSCOPIC HYSTERECTOMY WITH BILATERAL SALPINGO OOPHORECTOMY AND OOMENTECTOMY WITH RADICAL TUMOR  [place] ;  Surgeon: Everitt Amber, MD;  Location: [facility];  Service: Gynecology;  Laterality: Bilateral;  . Laparotomy N/A 12/29/2014    Procedure:  LAPAROTOMY;  Surgeon: Everitt Amber, MD;  Location: [facility];  Service: Gynecology;  Laterality: N/A;  . Tonsillectomy    . Cholecystectomy N/A 08/09/2015    Procedure: Attempted LAPAROSCOPIC coverted  open CHOLECYSTECTOMY WITH INTRAOPERATIVE CHOLANGIOGRAM;  Surgeon: Autumn Messing III, MD;  Location: [facility];  Service: General;  Laterality: N/A;   Family History  Problem Relation Age of Onset  . Diabetes Father   . Lung cancer Father 65    metastasis to liver  . Pancreatic cancer Mother 51  . Stroke Paternal Grandfather   . Colon cancer Neg Hx   . Stomach cancer Neg Hx   . Heart disease Maternal Aunt   . Diabetes Paternal Uncle   . Heart Problems Maternal Grandmother   . Heart Problems Maternal Grandfather   . Diabetes Paternal Grandmother   . Stroke Paternal Grandmother   . Heart Problems Paternal Uncle   . Cancer Cousin     unknown type  . Breast cancer Cousin     dx. 39s  . Leukemia Cousin     dx. 16-17   Social History  Substance Use Topics  . Smoking status: Former Smoker -- 1.00 packs/day for 40 years    Types: Cigarettes    Quit date: 07/10/2013  . Smokeless tobacco: Never Used  . Alcohol Use: 0.0 oz/week    0  Standard drinks or equivalent per week     Comment: social    OB History    No data available     Review of Systems  Constitutional: Negative for fever.  HENT: Negative for sore throat.   Eyes: Negative for visual disturbance.  Respiratory: Negative for cough and shortness of breath.   Cardiovascular: Negative for chest pain.  Gastrointestinal: Positive for nausea, vomiting, abdominal pain and constipation. Negative for diarrhea.  Genitourinary: Negative for difficulty urinating.  Musculoskeletal: Negative for back pain and neck pain.  Skin: Negative for rash.  Neurological: Negative for syncope and headaches.       Allergies  Latex and Penicillins  Home Medications   Prior to Admission medications   Medication Sig Start Date End Date Taking? Authorizing Provider  Acetaminophen (TYLENOL) 325 MG CAPS Take 1 tablet by mouth 3 (three) times daily as needed (pain).    Yes Historical Provider, MD  antiseptic oral rinse (BIOTENE) LIQD 15 mLs by Mouth Rinse route 3 (three) times daily.   Yes Historical Provider, MD  atorvastatin (LIPITOR) 10 MG tablet Take 10 mg by mouth daily.   Yes Historical Provider, MD  Biotin 2500 MCG CAPS Take 1 tablet by mouth daily.   Yes Historical Provider, MD  budesonide-formoterol (SYMBICORT) 160-4.5 MCG/ACT inhaler Inhale 2 puffs into the lungs 2 (two) times daily. 09/24/15  Yes Collene Gobble, MD  Cyanocobalamin (B-12 IJ) Inject 1 each as directed every 30 (thirty) days. Reported on 10/07/2015   Yes Historical Provider, MD  dexamethasone (DECADRON) 4 MG tablet Take 10 mg (2.5 tablets) at 10:00PM night before chemotherapy and repeat at 6:00AM the morning of chemotherapy Patient taking differently: Take 10 mg by mouth daily as needed. Take 10 mg (2.5 tablets) at 10:00PM night before chemotherapy and repeat at 6:00AM the morning of chemotherapy 10/07/15  Yes Ladell Pier, MD  famotidine (PEPCID) 10 MG tablet Take 20 mg by mouth daily.    Yes Historical Provider, MD  fluocinonide cream (LIDEX) [id number] % Apply 1 application topically 2 (two) times daily as needed (For ezcema).    Yes Historical Provider, MD  lidocaine-prilocaine (EMLA) cream Apply to port site one hour prior to use. Do not rub in. Cover with plastic. Patient taking differently: Apply 1 application topically daily as needed. Apply to port site one hour prior to use. Do not rub in. Cover with plastic. 10/28/14  Yes Ladell Pier, MD  Omega-3 Fatty Acids (OMEGA-3 PO) Take 1 capsule by mouth daily.   Yes Historical Provider, MD  ondansetron (ZOFRAN-ODT) 4 MG disintegrating tablet Take 1 tablet (4 mg total) by mouth  daily as needed for nausea or vomiting. 10/07/15  Yes Ladell Pier, MD  oxyCODONE (OXY IR/ROXICODONE) 5 MG immediate release tablet Take 1 tablet by mouth every 4 (four) hours as needed for severe pain.  08/11/15  Yes Historical Provider, MD  PROAIR HFA 108 (90 BASE) MCG/ACT inhaler INHALE 2 PUFFS BY MOUTH FOUR TIMES DAILY AS NEEDED FOR WHEEZING 07/08/14  Yes Kathee Delton, MD  Tiotropium Bromide Monohydrate (SPIRIVA RESPIMAT) 2.5 MCG/ACT AERS 2 pffs each am Patient taking differently: Inhale 2 puffs into the lungs every morning. 2 pffs each am 07/14/15  Yes Tanda Rockers, MD  triamterene-hydrochlorothiazide (MAXZIDE-25) 37.5-25 MG tablet Take 0.5 tablets by mouth daily.    Yes Historical Provider, MD  vitamin B-12 (CYANOCOBALAMIN) 100 MCG tablet Take 100 mcg by mouth daily.   Yes Historical Provider, MD   BP  125/61 mmHg  Pulse 85  Temp(Src) 98.8 F (37.1 C) (Oral)  Resp 20  Ht 5\' 1"  (1.549 m)  Wt 181 lb (82.101 kg)  BMI 34.22 kg/m2  SpO2 92% Physical Exam  Constitutional: She is oriented to person, place, and time. She appears well-developed and well-nourished. No distress.  HENT:  Head: Normocephalic and atraumatic.  Eyes: Conjunctivae and EOM are normal.  Neck: Normal range of motion.  Cardiovascular: Normal rate, regular rhythm, normal heart sounds and intact distal pulses.  Exam reveals no gallop and no friction rub.   No murmur heard. Pulmonary/Chest: Effort normal and breath sounds normal. No respiratory distress. She has no wheezes. She has no rales.  Abdominal: Soft. She exhibits no distension. There is tenderness (diffuse). There is guarding.  Musculoskeletal: She exhibits no edema or tenderness.  Neurological: She is alert and oriented to person, place, and time.  Skin: Skin is warm and dry. No rash noted. She is not diaphoretic. No erythema.  Nursing note and vitals reviewed.   ED Course  Procedures (including critical care time) Labs Review Labs Reviewed  CBC WITH  DIFFERENTIAL/PLATELET - Abnormal; Notable for the following:    WBC 13.1 (*)    RDW 16.2 (*)    Neutro Abs 12.0 (*)    All other components within normal limits  COMPREHENSIVE METABOLIC PANEL - Abnormal; Notable for the following:    Sodium 133 (*)    Chloride 95 (*)    Glucose, Bld 141 (*)    BUN 21 (*)    Creatinine, Ser 1.10 (*)    GFR calc non Af Amer 49 (*)    GFR calc Af Amer 57 (*)    All other components within normal limits  BASIC METABOLIC PANEL - Abnormal; Notable for the following:    Glucose, Bld 100 (*)    Calcium 8.7 (*)    GFR calc non Af Amer 60 (*)    All other components within normal limits  CBC - Abnormal; Notable for the following:    RBC 3.78 (*)    Hemoglobin 11.4 (*)    HCT 35.3 (*)    RDW 16.0 (*)    All other components within normal limits  URINE CULTURE  LIPASE, BLOOD  URINALYSIS, ROUTINE W REFLEX MICROSCOPIC (NOT AT [facility])  TSH    Imaging Review Dg Abd 2 Views  10/08/2015  CLINICAL DATA:  Abdominal pain with peritoneal carcinomatosis. Ovarian carcinoma EXAM: ABDOMEN - 2 VIEW COMPARISON:  CT abdomen and pelvis October 07, 2015 FINDINGS: There is fluid throughout the colon. There is no bowel dilatation or air-fluid level suggesting obstruction. No free air. Lung bases are clear. There are small probable phleboliths in the pelvis. There are surgical clips in the upper right abdomen. IMPRESSION: Fluid throughout the colon. This finding may be normal but also may be indicative of early colonic ileus. No bowel obstruction evident. No free air. Electronically Signed   By: Lowella Grip III M.D.   On: 10/08/2015 15:00   I have personally reviewed and evaluated these images and lab results as part of my medical decision-making.   EKG Interpretation None      MDM   Final diagnoses:  Sigmoid stricture (H[facility])  Constipation, unspecified constipation type  Partial bowel obstruction [facility])   74yo female wit history of ovarian cancer, COPD,  cholecystectomy at the end of January, presents with concern for abdominal pain, distention, nausea and vomiting with inability to tolerate po. Patient was seen at [place]  office and was sent for admission for IV hydration, evaluation for obstruction. Patient ahd CT done yesterday which showed sigmoid stricture resulting in mild obstruction.  In addition, radiology comments on fluid in RUQ, however clinical history is not consistent with infection or bilie leak, as pt has no RUQ pain, no fever, has not had pain since immediately after surgery.  Discussed sigmoid stricture resulting in constipation/partial obstruction with Dr. Marlou Starks who will evaluate patient, however feels sigmoid adhesion less likely and recommends further GI/oncology evaluation for possible other mass resulting in stricture.  Pt given IVF, nausea medicines. Not actively vomiting and will not place ng at this time. Admitted to hospitalist for further care with surgery consult.    Gareth Morgan, MD 10/09/15 1217

## 2015-10-08 NOTE — H&P (Signed)
History and Physical  Lynn Ramirez [id number] DOB: 1940-12-16 DOA: 10/08/2015   PCP: Antony Blackbird, MD  Referring Physician: ED/ Dr. Billy Fischer  Chief Complaint: abdominal pain with N/V  HPI:  75 year old female with a history of ovarian adenocarcinoma followed by Dr. Benay Spice, COPD, hyperlipidemia hypertension presents with 2 day history of worsening abdominal pain.  The patientnormally struggles with some constipation, but she states that she had a bowel movement on 10/05/2015 morning.on 10/06/2015, the patient began developing abdominal pain that continued to worsen. She went to emergency department on the morning of 10/07/2015. CT of the abdomen and pelvis at that time revealed diffuse omental nodularity. There was also a focal area of persistent narrowing of the sigmoid colon concerning for possible stricture. There was a large amount of stool without any small bowel obstruction. There were multiple loops of small bowel abutting the peritoneal wall consistent with adhesions. The patient was given opioids with symptomatic relief. She was discharged home in stable condition from the emergency department. Unfortunately, the patient continued to have abdominal pain. She used a fleets enema which only resulted in a small amount of stool, and she had persistent abdominal pain. She went to the cancer center today, 10/08/15. Because of concerns for possible obstruction, the patient was sent back to the emergency department for further evaluation. In the emergency department, 2 view abdominal x-ray  Was negative for any bowel dilatation or air-fluid collections to suggest obstruction. WBC was 13.1. BMP and hepatic enzymes and lipase were unremarkable. The patient was afebrile and hemodynamically stable. Urinalysis was negative for pyuria.  Gen. Surgery was consulted by the emergency department and will consult..  Assessment/Plan: Abdominal pain -likely due to constipation, possible obstipation  which is likely due to adhesions and peritoneal carcinomatosis -The patient only had small amount of stool with fleets enema. -10/07/2015 CT abdomen and pelviswith a large amount of stool and possible narrowing of the sigmoid colon -Try SMOG enema -If no results, may need to involve gastroenterology--?need for stent -Gen. Surgery has been consulted from the emergency department -IV fluids -Judicious opioids for pain relief -TSH Ovarian serous carcinoma -follows Dr. Benay Spice -there is clinical evidence of disease progression per Dr. Benay Spice -last chemotherapy 03/10/2015 Nausea and vomiting -LFTs and lipase unremarkable -Symptomatically treatment -2 view abdominal x-ray negative for obstruction -clear liquids COPD -Stable without exacerbation -Continue Symbicort and Spiriva Hypertension -Elevated BP Partly due to pain -Hold Maxzide for now Leukocytosis -Afebrile and hemodynamically stable -May be stress demargination -Obtain chest x-ray -Urinalysis negative for pyuria       Past Medical History  Diagnosis Date  . H/O hydronephrosis   . Hypertension   . Emphysema   . COPD (chronic obstructive pulmonary disease) ([place])   . Cancer ([place])     ovarian  . Eczema     hands  . GERD (gastroesophageal reflux disease)   . History of transfusion     age 18  . Difficulty sleeping    Past Surgical History  Procedure Laterality Date  . Nephrectomy    . Tubal ligation    . Hand surgery Right 1980's  . Thoracentesis      several  . Robotic assisted total hysterectomy with bilateral salpingo oopherectomy Bilateral 12/29/2014    Procedure: ROBOTIC ASSISTED TOTAL LAPAROSCOPIC HYSTERECTOMY WITH BILATERAL SALPINGO OOPHORECTOMY AND OOMENTECTOMY WITH RADICAL TUMOR [place] ;  Surgeon: Everitt Amber, MD;  Location: [facility];  Service: Gynecology;  Laterality: Bilateral;  . Laparotomy N/A 12/29/2014  Procedure:  LAPAROTOMY;  Surgeon: Everitt Amber, MD;  Location: [facility];  Service: Gynecology;   Laterality: N/A;  . Tonsillectomy    . Cholecystectomy N/A 08/09/2015    Procedure: Attempted LAPAROSCOPIC coverted  open CHOLECYSTECTOMY WITH INTRAOPERATIVE CHOLANGIOGRAM;  Surgeon: Autumn Messing III, MD;  Location: [facility];  Service: General;  Laterality: N/A;   Social History:  reports that she quit smoking about 2 years ago. Her smoking use included Cigarettes. She has a 40 pack-year smoking history. She has never used smokeless tobacco. She reports that she drinks alcohol. She reports that she does not use illicit drugs.   Family History  Problem Relation Age of Onset  . Diabetes Father   . Lung cancer Father 61    metastasis to liver  . Pancreatic cancer Mother 22  . Stroke Paternal Grandfather   . Colon cancer Neg Hx   . Stomach cancer Neg Hx   . Heart disease Maternal Aunt   . Diabetes Paternal Uncle   . Heart Problems Maternal Grandmother   . Heart Problems Maternal Grandfather   . Diabetes Paternal Grandmother   . Stroke Paternal Grandmother   . Heart Problems Paternal Uncle   . Cancer Cousin     unknown type  . Breast cancer Cousin     dx. 40s  . Leukemia Cousin     dx. 16-17     Allergies  Allergen Reactions  . Latex Rash    Latex gloves ONLY  . Penicillins Other (See Comments)    Has patient had a PCN reaction causing immediate rash, facial/tongue/throat swelling, SOB or lightheadedness with hypotension: Yes  Has patient had a PCN reaction causing severe rash involving mucus membranes or skin necrosis:No Has patient had a PCN reaction that required hospitalization:No Has patient had a PCN reaction occurring within the last 10 years:No If all of the above answers are "NO", then may proceed with Cephalosporin use. welps      Prior to Admission medications   Medication Sig Start Date End Date Taking? Authorizing Provider  Acetaminophen (TYLENOL) 325 MG CAPS Take 1 tablet by mouth 3 (three) times daily as needed (pain).    Yes Historical Provider, MD  antiseptic  oral rinse (BIOTENE) LIQD 15 mLs by Mouth Rinse route 3 (three) times daily.   Yes Historical Provider, MD  atorvastatin (LIPITOR) 10 MG tablet Take 10 mg by mouth daily.   Yes Historical Provider, MD  Biotin 2500 MCG CAPS Take 1 tablet by mouth daily.   Yes Historical Provider, MD  budesonide-formoterol (SYMBICORT) 160-4.5 MCG/ACT inhaler Inhale 2 puffs into the lungs 2 (two) times daily. 09/24/15  Yes Collene Gobble, MD  Cyanocobalamin (B-12 IJ) Inject 1 each as directed every 30 (thirty) days. Reported on 10/07/2015   Yes Historical Provider, MD  dexamethasone (DECADRON) 4 MG tablet Take 10 mg (2.5 tablets) at 10:00PM night before chemotherapy and repeat at 6:00AM the morning of chemotherapy Patient taking differently: Take 10 mg by mouth daily as needed. Take 10 mg (2.5 tablets) at 10:00PM night before chemotherapy and repeat at 6:00AM the morning of chemotherapy 10/07/15  Yes Ladell Pier, MD  famotidine (PEPCID) 10 MG tablet Take 20 mg by mouth daily.    Yes Historical Provider, MD  fluocinonide cream (LIDEX) [id number] % Apply 1 application topically 2 (two) times daily as needed (For ezcema).    Yes Historical Provider, MD  lidocaine-prilocaine (EMLA) cream Apply to port site one hour prior to use. Do not rub in.  Cover with plastic. Patient taking differently: Apply 1 application topically daily as needed. Apply to port site one hour prior to use. Do not rub in. Cover with plastic. 10/28/14  Yes Ladell Pier, MD  Omega-3 Fatty Acids (OMEGA-3 PO) Take 1 capsule by mouth daily.   Yes Historical Provider, MD  ondansetron (ZOFRAN-ODT) 4 MG disintegrating tablet Take 1 tablet (4 mg total) by mouth daily as needed for nausea or vomiting. 10/07/15  Yes Ladell Pier, MD  oxyCODONE (OXY IR/ROXICODONE) 5 MG immediate release tablet Take 1 tablet by mouth every 4 (four) hours as needed for severe pain.  08/11/15  Yes Historical Provider, MD  PROAIR HFA 108 (90 BASE) MCG/ACT inhaler INHALE 2 PUFFS BY MOUTH  FOUR TIMES DAILY AS NEEDED FOR WHEEZING 07/08/14  Yes Kathee Delton, MD  Tiotropium Bromide Monohydrate (SPIRIVA RESPIMAT) 2.5 MCG/ACT AERS 2 pffs each am Patient taking differently: Inhale 2 puffs into the lungs every morning. 2 pffs each am 07/14/15  Yes Tanda Rockers, MD  triamterene-hydrochlorothiazide (MAXZIDE-25) 37.5-25 MG tablet Take 0.5 tablets by mouth daily.    Yes Historical Provider, MD  vitamin B-12 (CYANOCOBALAMIN) 100 MCG tablet Take 100 mcg by mouth daily.   Yes Historical Provider, MD    Review of Systems:  Constitutional:  No weight loss, night sweats, Fevers, chills, fatigue.  Head&Eyes: No headache.  No vision loss.  No eye pain or scotoma ENT:  No Difficulty swallowing,Tooth/dental problems,Sore throat,  No ear ache, post nasal drip,  Cardio-vascular:  No chest pain, Orthopnea, PND, swelling in lower extremities,  dizziness, palpitations  GI:  No   diarrhea, loss of appetite, hematochezia, melena, heartburn,  Resp:  No shortness of breath with exertion or at rest. No cough. No coughing up of blood .No wheezing.No chest wall deformity  Skin:  no rash or lesions.  GU:  no dysuria, change in color of urine, no urgency or frequency. No flank pain.  Musculoskeletal:  No joint pain or swelling. No decreased range of motion. No back pain.  Psych:  No change in mood or affect. No depression or anxiety. Neurologic: No headache, no dysesthesia, no focal weakness, no vision loss. No syncope  Physical Exam: Filed Vitals:   10/08/15 1643  BP: 157/135  Pulse: 90  Temp: 98.3 F (36.8 C)  TempSrc: Oral  Resp: 19  SpO2: 92%   General:  A&O x 3, NAD, nontoxic, pleasant/cooperative Head/Eye: No conjunctival hemorrhage, no icterus, /AT, No nystagmus ENT:  No icterus,  No thrush, good dentition, no pharyngeal exudate Neck:  No masses, no lymphadenpathy, no bruits CV:  RRR, no rub, no gallop, no S3 Lung:  CTAB, good air movement, no wheeze, no rhonchi Abdomen:  soft/diffuse abdominal pain without rebound, +BS, nondistended, no peritoneal signs Ext: No cyanosis, No rashes, No petechiae, No lymphangitis, No edema; no clubbing Neuro: CNII-XII intact, strength 4/5 in bilateral upper and lower extremities, no dysmetria  Labs on Admission:  Basic Metabolic Panel:  Recent Labs Lab 10/07/15 0009 10/08/15 1714  NA 138 133*  K 3.7 3.6  CL 100* 95*  CO2 28 28  GLUCOSE 150* 141*  BUN 28* 21*  CREATININE 1.07* 1.10*  CALCIUM 10.1 9.4   Liver Function Tests:  Recent Labs Lab 10/07/15 0009 10/08/15 1714  AST 19 15  ALT 19 16  ALKPHOS 80 74  BILITOT 0.8 0.9  PROT 7.9 7.2  ALBUMIN 4.1 3.7    Recent Labs Lab 10/07/15 0009 10/08/15 1714  LIPASE  27 20   No results for input(s): AMMONIA in the last 168 hours. CBC:  Recent Labs Lab 10/07/15 0009 10/08/15 1714  WBC 9.6 13.1*  NEUTROABS  --  12.0*  HGB 13.2 13.0  HCT 40.3 41.2  MCV 92.9 93.8  PLT 281 233   Cardiac Enzymes: No results for input(s): CKTOTAL, CKMB, CKMBINDEX, TROPONINI in the last 168 hours. BNP: Invalid input(s): POCBNP CBG: No results for input(s): GLUCAP in the last 168 hours.  Radiological Exams on Admission: Ct Abdomen Pelvis W Contrast  10/07/2015  CLINICAL DATA:  75 year old female with ovarian cancer presenting with worsening lower abdominal pain. EXAM: CT ABDOMEN AND PELVIS WITH CONTRAST TECHNIQUE: Multidetector CT imaging of the abdomen and pelvis was performed using the standard protocol following bolus administration of intravenous contrast. CONTRAST:  161mL ISOVUE-300 IOPAMIDOL (ISOVUE-300) INJECTION 61% COMPARISON:  CT dated 08/07/2015 FINDINGS: The visualized lung bases are clear. Mild right pleural based thickening/scarring. No intra-abdominal free air or free fluid. Cholecystectomy. Small amount of fluid is noted in the subhepatic area adjacent to the cholecystectomy clips. This may represent a small fluid collection with possible superimposed infection  or leakage of bile. Mild fatty infiltration of the liver. There is mild intrahepatic biliary ductal dilatation, possibly post cholecystectomy. The pancreas, spleen, and adrenal glands appear unremarkable. There is a solitary right kidney. An extrarenal pelvis noted on the right. There is mild right hydronephrosis similar to prior study. No calcified stone identified. A focal area of stricture at the right UPJ is not excluded. The right ureter and urinary bladder appear unremarkable. There is hypo enhancement of the right renal parenchyma possibly related to hydronephrosis. Correlation with urinalysis recommended to exclude UTI. Hysterectomy. There is postsurgical changes and scarring within the pelvis. There is a 1 cm calcific density in the right hemipelvis which may be dystrophic calcification and related to prior inflammation or a diverticulith. There is a focal area of persistent narrowing in the sigmoid colon adjacent to this calcification likely related to underlying adhesion or stricture. Large amount of stool noted throughout the colon proximal to this point suggestive of a degree of luminal narrowing and stricture. There is sigmoid diverticulosis and scattered colonic diverticula without active inflammatory changes. There is no evidence of small-bowel obstruction. There is abutment of multiple loops of small bowel to the anterior peritoneal wall compatible with adhesions. The appendix is not visualized and may be surgically absent. There is mild diffuse nodularity of the omentum predominantly in the left upper abdomen (series 2, image 26). Early omental implants are not excluded. There is diastases of anterior abdominal wall musculature with a broad-based shallow hernia with abutment of short segment of transverse colon and distal stomach to the anterior peritoneal wall. There is aortoiliac atherosclerotic disease. No portal venous gas identified. There is no adenopathy. There is degenerative changes of the  spine. No acute fracture. IMPRESSION: Cholecystectomy with small amount of fluid adjacent to the cholecystectomy bed may represent an infected fluid or bile leak. Clinical correlation is recommended. A hepatobiliary scintigraphy may provide additional information if a bowel leak is clinically suspected. Postsurgical changes of hysterectomy with scarring in the pelvis. Focal area of persistent narrowing of the sigmoid colon in this region is concerning for stricture possibly related to adhesion with associated mild obstruction. Large amount of stool noted proximal to point. No evidence of small-bowel obstruction. Colonic diverticulosis without active inflammation. Stable appearing mild right hydronephrosis. Underlying UPJ stricture is not excluded. Electronically Signed   By: Anner Crete  M.D.   On: 10/07/2015 02:50   Dg Abd 2 Views  10/08/2015  CLINICAL DATA:  Abdominal pain with peritoneal carcinomatosis. Ovarian carcinoma EXAM: ABDOMEN - 2 VIEW COMPARISON:  CT abdomen and pelvis October 07, 2015 FINDINGS: There is fluid throughout the colon. There is no bowel dilatation or air-fluid level suggesting obstruction. No free air. Lung bases are clear. There are small probable phleboliths in the pelvis. There are surgical clips in the upper right abdomen. IMPRESSION: Fluid throughout the colon. This finding may be normal but also may be indicative of early colonic ileus. No bowel obstruction evident. No free air. Electronically Signed   By: Lowella Grip III M.D.   On: 10/08/2015 15:00       Time spent:60 minutes Code Status:   FULL Family Communication:   Husband updated at bedside   Zeb Rawl, DO  [facility] Pager [phone]  If 7PM-7AM, please contact night-coverage [email or web address] Password [facility] 10/08/2015, 6:14 PM

## 2015-10-08 NOTE — Progress Notes (Addendum)
[place] OFFICE PROGRESS NOTE   Diagnosis:  Ovarian cancer  INTERVAL HISTORY:   Lynn Ramirez returns prior to scheduled follow-up for evaluation of abdominal pain. She reports increased abdominal pain, mainly located at the lower abdomen. She is nauseated. She vomited earlier today after drinking some "hot tea". Abdomen feels "full". She is passing gas and has had a few "very small" bowel movements. No fever. Mouth feels dry. Oral intake is poor.  Objective:  Vital signs in last 24 hours:  Blood pressure 146/76, pulse 102, temperature 98.3 F (36.8 C), temperature source Oral, resp. rate 17, height 5\' 1"  (1.549 m), weight 181 lb 4.8 oz (82.237 kg), SpO2 92 %.   Obviously uncomfortable elderly female. HEENT: No thrush or ulcers. Resp: Lungs clear bilaterally. Cardio: Regular rate and rhythm. GI: Abdomen is soft. Bowel sounds markedly diminished. Abdomen is tender throughout, greatest at the lower abdomen. Vascular: No leg edema. Portacath without erythema.  Lab Results:  Lab Results  Component Value Date   WBC 9.6 10/07/2015   HGB 13.2 10/07/2015   HCT 40.3 10/07/2015   MCV 92.9 10/07/2015   PLT 281 10/07/2015   NEUTROABS 4.6 08/31/2015    Imaging:  Ct Abdomen Pelvis W Contrast  10/07/2015  CLINICAL DATA:  75 year old female with ovarian cancer presenting with worsening lower abdominal pain. EXAM: CT ABDOMEN AND PELVIS WITH CONTRAST TECHNIQUE: Multidetector CT imaging of the abdomen and pelvis was performed using the standard protocol following bolus administration of intravenous contrast. CONTRAST:  145mL ISOVUE-300 IOPAMIDOL (ISOVUE-300) INJECTION 61% COMPARISON:  CT dated 08/07/2015 FINDINGS: The visualized lung bases are clear. Mild right pleural based thickening/scarring. No intra-abdominal free air or free fluid. Cholecystectomy. Small amount of fluid is noted in the subhepatic area adjacent to the cholecystectomy clips. This may represent a small  fluid collection with possible superimposed infection or leakage of bile. Mild fatty infiltration of the liver. There is mild intrahepatic biliary ductal dilatation, possibly post cholecystectomy. The pancreas, spleen, and adrenal glands appear unremarkable. There is a solitary right kidney. An extrarenal pelvis noted on the right. There is mild right hydronephrosis similar to prior study. No calcified stone identified. A focal area of stricture at the right UPJ is not excluded. The right ureter and urinary bladder appear unremarkable. There is hypo enhancement of the right renal parenchyma possibly related to hydronephrosis. Correlation with urinalysis recommended to exclude UTI. Hysterectomy. There is postsurgical changes and scarring within the pelvis. There is a 1 cm calcific density in the right hemipelvis which may be dystrophic calcification and related to prior inflammation or a diverticulith. There is a focal area of persistent narrowing in the sigmoid colon adjacent to this calcification likely related to underlying adhesion or stricture. Large amount of stool noted throughout the colon proximal to this point suggestive of a degree of luminal narrowing and stricture. There is sigmoid diverticulosis and scattered colonic diverticula without active inflammatory changes. There is no evidence of small-bowel obstruction. There is abutment of multiple loops of small bowel to the anterior peritoneal wall compatible with adhesions. The appendix is not visualized and may be surgically absent. There is mild diffuse nodularity of the omentum predominantly in the left upper abdomen (series 2, image 26). Early omental implants are not excluded. There is diastases of anterior abdominal wall musculature with a broad-based shallow hernia with abutment of short segment of transverse colon and distal stomach to the anterior peritoneal wall. There is aortoiliac atherosclerotic disease. No portal venous gas identified. There  is no adenopathy. There is degenerative changes of the spine. No acute fracture. IMPRESSION: Cholecystectomy with small amount of fluid adjacent to the cholecystectomy bed may represent an infected fluid or bile leak. Clinical correlation is recommended. A hepatobiliary scintigraphy may provide additional information if a bowel leak is clinically suspected. Postsurgical changes of hysterectomy with scarring in the pelvis. Focal area of persistent narrowing of the sigmoid colon in this region is concerning for stricture possibly related to adhesion with associated mild obstruction. Large amount of stool noted proximal to point. No evidence of small-bowel obstruction. Colonic diverticulosis without active inflammation. Stable appearing mild right hydronephrosis. Underlying UPJ stricture is not excluded. Electronically Signed   By: Anner Crete M.D.   On: 10/07/2015 02:50   Dg Abd 2 Views  10/08/2015  CLINICAL DATA:  Abdominal pain with peritoneal carcinomatosis. Ovarian carcinoma EXAM: ABDOMEN - 2 VIEW COMPARISON:  CT abdomen and pelvis October 07, 2015 FINDINGS: There is fluid throughout the colon. There is no bowel dilatation or air-fluid level suggesting obstruction. No free air. Lung bases are clear. There are small probable phleboliths in the pelvis. There are surgical clips in the upper right abdomen. IMPRESSION: Fluid throughout the colon. This finding may be normal but also may be indicative of early colonic ileus. No bowel obstruction evident. No free air. Electronically Signed   By: Lowella Grip III M.D.   On: 10/08/2015 15:00    Medications: I have reviewed the patient's current medications.  Assessment/Plan: 1. 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 Significant improvement in malignant right pleural effusion.  Status post robotic-assisted laparoscopic hysterectomy with bilateral salpingoophorectomy, omentectomy, radical tumor debulking 12/29/2014. Per Dr. Serita Grit office note 01/14/2015 cytoreduction was optimal with residual disease remaining only in the 1 mm implants on the small intestine. 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  Cycle 1 adjuvant Taxol/carboplatin 01/20/2015  Cycle 2 adjuvant Taxol/carboplatin 02/10/2015  Cycle 3 adjuvant Taxol/carboplatin 03/10/2015  CA 125 on 1013 2016-42  CTs of the chest, abdomen, and pelvis 05/31/2015 with no evidence of progressive ovarian cancer  CTs of the chest, abdomen, and pelvis 08/07/2015 and 08/08/2015-no evidence of progressive ovarian cancer  Peritoneal studding noted at the time of the cholecystectomy procedure 08/09/2015  Elevated CA 125 10/07/2015  CT 10/07/2015 with stricturing at the sigmoid colon, constipation, and omental nodularity 2. COPD 3. Dyspnea secondary to COPD and the large right pleural effusion, status post therapeutic thoracentesis procedures 09/30/2014,10/09/2014, and  10/21/2014. Left thoracentesis 10/30/2014 4. Left nephrectomy as a child 5. Delayed nausea following cycle 1 Taxol/carboplatin, Aloxi/Emend added with cycle 2 with improvement. 6. Right lower extremity edema, right calf pain 12/09/2014. Negative venous Doppler 12/09/2014. 7. Diffuse pruritus following cycle 1 adjuvant Taxol/carboplatin 01/20/2015,  no rash, resolved with a steroid dose pack 8. Thrombocytopenia second to chemotherapy, the carboplatin was dose reduced with cycle 2 adjuvant Taxol/carboplatin 02/10/2015 9. Chemotherapy-induced peripheral neuropathy-improved 10. Admission with acute cholecystitis 08/07/2015, status post a cholecystectomy 08/09/2015 11. Presentation to the emergency room 10/06/2015 with abdominal pain and constipation   Disposition: Lynn Ramirez has progressive ovarian cancer. She presents today with increased abdominal pain, nausea, constipation. Clinically she appears to have a bowel obstruction. We recommend hospitalization for intravenous hydration, pain management, possible nasogastric tube. She may need evaluation by surgery. She is in agreement. Dr. Benay Spice spoke with the hospitalist who recommended evaluation in the emergency department prior to admission.    Ned Card ANP/GNP-BC   10/08/2015  3:13 PM  this was a shared visit with Ned Card.  Lynn Ramirez was interviewed and examined. We are concerned she is developing an early bowel obstruction related to carcinomatosis or less likely surgical adhesions. I contacted the hospitalist service to arrange for hospital admission and she has been referred to the emergency room. She will likely need surgical consultation.   Please call Oncology as needed. I will check on her 10/11/2015.  Julieanne Manson, M.D.

## 2015-10-08 NOTE — ED Notes (Signed)
Per pt report: pt coming from the [facility] with abd pain.  Pt has n/v, constipation.  Pt was thought to a bowel obstruction but imaging didn't show a bowel obstruction or perforation.  Pt was told to come to the ED to be admitted.

## 2015-10-08 NOTE — Consult Note (Signed)
Reason for Consult:abdominal pain Referring Physician: Dr. Carles Collet  Lynn Ramirez is an 75 y.o. female.  HPI:  The patient is a 75 year old white female who has a history of  Advanced ovarian cancer. She also had a open cholecystectomy about 2 months ago which she tolerated well. She was getting ready to restart her chemotherapy next week. Over the last few days she has had some problems with constipation and abdominal pain. She had an episode of vomiting earlier today. She had a CT scan done 2 days ago which questioned a narrowing that her sigmoid colon which may be secondary to the ovarian cancer. There was no sign of obstruction. The abdominal pain has persisted. After vomiting earlier she decided to come to the emergency department. Her plain x-rays also today do not show a sign of bowel obstruction  Past Medical History  Diagnosis Date  . H/O hydronephrosis   . Hypertension   . Emphysema   . COPD (chronic obstructive pulmonary disease) ([place])   . Cancer ([place])     ovarian  . Eczema     hands  . GERD (gastroesophageal reflux disease)   . History of transfusion     age 65  . Difficulty sleeping     Past Surgical History  Procedure Laterality Date  . Nephrectomy    . Tubal ligation    . Hand surgery Right 1980's  . Thoracentesis      several  . Robotic assisted total hysterectomy with bilateral salpingo oopherectomy Bilateral 12/29/2014    Procedure: ROBOTIC ASSISTED TOTAL LAPAROSCOPIC HYSTERECTOMY WITH BILATERAL SALPINGO OOPHORECTOMY AND OOMENTECTOMY WITH RADICAL TUMOR [place] ;  Surgeon: Everitt Amber, MD;  Location: [facility];  Service: Gynecology;  Laterality: Bilateral;  . Laparotomy N/A 12/29/2014    Procedure:  LAPAROTOMY;  Surgeon: Everitt Amber, MD;  Location: [facility];  Service: Gynecology;  Laterality: N/A;  . Tonsillectomy    . Cholecystectomy N/A 08/09/2015    Procedure: Attempted LAPAROSCOPIC coverted  open CHOLECYSTECTOMY WITH INTRAOPERATIVE CHOLANGIOGRAM;  Surgeon: Autumn Messing  III, MD;  Location: [facility];  Service: General;  Laterality: N/A;    Family History  Problem Relation Age of Onset  . Diabetes Father   . Lung cancer Father 65    metastasis to liver  . Pancreatic cancer Mother 55  . Stroke Paternal Grandfather   . Colon cancer Neg Hx   . Stomach cancer Neg Hx   . Heart disease Maternal Aunt   . Diabetes Paternal Uncle   . Heart Problems Maternal Grandmother   . Heart Problems Maternal Grandfather   . Diabetes Paternal Grandmother   . Stroke Paternal Grandmother   . Heart Problems Paternal Uncle   . Cancer Cousin     unknown type  . Breast cancer Cousin     dx. 38s  . Leukemia Cousin     dx. 16-17    Social History:  reports that she quit smoking about 2 years ago. Her smoking use included Cigarettes. She has a 40 pack-year smoking history. She has never used smokeless tobacco. She reports that she drinks alcohol. She reports that she does not use illicit drugs.  Allergies:  Allergies  Allergen Reactions  . Latex Rash    Latex gloves ONLY  . Penicillins Other (See Comments)    Has patient had a PCN reaction causing immediate rash, facial/tongue/throat swelling, SOB or lightheadedness with hypotension: Yes  Has patient had a PCN reaction causing severe rash involving mucus membranes or skin necrosis:No Has patient  had a PCN reaction that required hospitalization:No Has patient had a PCN reaction occurring within the last 10 years:No If all of the above answers are "NO", then may proceed with Cephalosporin use. welps    Medications: I have reviewed the patient's current medications.  Results for orders placed or performed during the hospital encounter of 10/08/15 (from the past 48 hour(s))  CBC with Differential     Status: Abnormal   Collection Time: 10/08/15  5:14 PM  Result Value Ref Range   WBC 13.1 (H) 4.0 - 10.5 K/uL   RBC 4.39 3.87 - 5.11 MIL/uL   Hemoglobin 13.0 12.0 - 15.0 g/dL   HCT 41.2 36.0 - 46.0 %   MCV 93.8 78.0 -  100.0 fL   MCH 29.6 26.0 - 34.0 pg   MCHC 31.6 30.0 - 36.0 g/dL   RDW 16.2 (H) 11.5 - 15.5 %   Platelets 233 150 - 400 K/uL   Neutrophils Relative % 92 %   Neutro Abs 12.0 (H) 1.7 - 7.7 K/uL   Lymphocytes Relative 5 %   Lymphs Abs 0.7 0.7 - 4.0 K/uL   Monocytes Relative 3 %   Monocytes Absolute 0.4 0.1 - 1.0 K/uL   Eosinophils Relative 0 %   Eosinophils Absolute 0.0 0.0 - 0.7 K/uL   Basophils Relative 0 %   Basophils Absolute 0.0 0.0 - 0.1 K/uL  Comprehensive metabolic panel     Status: Abnormal   Collection Time: 10/08/15  5:14 PM  Result Value Ref Range   Sodium 133 (L) 135 - 145 mmol/L   Potassium 3.6 3.5 - 5.1 mmol/L   Chloride 95 (L) 101 - 111 mmol/L   CO2 28 22 - 32 mmol/L   Glucose, Bld 141 (H) 65 - 99 mg/dL   BUN 21 (H) 6 - 20 mg/dL   Creatinine, Ser 1.10 (H) 0.44 - 1.00 mg/dL   Calcium 9.4 8.9 - 10.3 mg/dL   Total Protein 7.2 6.5 - 8.1 g/dL   Albumin 3.7 3.5 - 5.0 g/dL   AST 15 15 - 41 U/L   ALT 16 14 - 54 U/L   Alkaline Phosphatase 74 38 - 126 U/L   Total Bilirubin 0.9 0.3 - 1.2 mg/dL   GFR calc non Af Amer 49 (L) >60 mL/min   GFR calc Af Amer 57 (L) >60 mL/min    Comment: (NOTE) The eGFR has been calculated using the CKD EPI equation. This calculation has not been validated in all clinical situations. eGFR's persistently <60 mL/min signify possible Chronic Kidney Disease.    Anion gap 10 5 - 15  Lipase, blood     Status: None   Collection Time: 10/08/15  5:14 PM  Result Value Ref Range   Lipase 20 11 - 51 U/L  Urinalysis, Routine w reflex microscopic (not at [facility])     Status: None   Collection Time: 10/08/15  6:42 PM  Result Value Ref Range   Color, Urine YELLOW YELLOW   APPearance CLEAR CLEAR   Specific Gravity, Urine 1.012 1.005 - 1.030   pH 5.0 5.0 - 8.0   Glucose, UA NEGATIVE NEGATIVE mg/dL   Hgb urine dipstick NEGATIVE NEGATIVE   Bilirubin Urine NEGATIVE NEGATIVE   Ketones, ur NEGATIVE NEGATIVE mg/dL   Protein, ur NEGATIVE NEGATIVE mg/dL    Nitrite NEGATIVE NEGATIVE   Leukocytes, UA NEGATIVE NEGATIVE    Comment: MICROSCOPIC NOT DONE ON URINES WITH NEGATIVE PROTEIN, BLOOD, LEUKOCYTES, NITRITE, OR GLUCOSE <1000 mg/dL.  Ct Abdomen Pelvis W Contrast  10/07/2015  CLINICAL DATA:  76 year old female with ovarian cancer presenting with worsening lower abdominal pain. EXAM: CT ABDOMEN AND PELVIS WITH CONTRAST TECHNIQUE: Multidetector CT imaging of the abdomen and pelvis was performed using the standard protocol following bolus administration of intravenous contrast. CONTRAST:  143m ISOVUE-300 IOPAMIDOL (ISOVUE-300) INJECTION 61% COMPARISON:  CT dated 08/07/2015 FINDINGS: The visualized lung bases are clear. Mild right pleural based thickening/scarring. No intra-abdominal free air or free fluid. Cholecystectomy. Small amount of fluid is noted in the subhepatic area adjacent to the cholecystectomy clips. This may represent a small fluid collection with possible superimposed infection or leakage of bile. Mild fatty infiltration of the liver. There is mild intrahepatic biliary ductal dilatation, possibly post cholecystectomy. The pancreas, spleen, and adrenal glands appear unremarkable. There is a solitary right kidney. An extrarenal pelvis noted on the right. There is mild right hydronephrosis similar to prior study. No calcified stone identified. A focal area of stricture at the right UPJ is not excluded. The right ureter and urinary bladder appear unremarkable. There is hypo enhancement of the right renal parenchyma possibly related to hydronephrosis. Correlation with urinalysis recommended to exclude UTI. Hysterectomy. There is postsurgical changes and scarring within the pelvis. There is a 1 cm calcific density in the right hemipelvis which may be dystrophic calcification and related to prior inflammation or a diverticulith. There is a focal area of persistent narrowing in the sigmoid colon adjacent to this calcification likely related to underlying  adhesion or stricture. Large amount of stool noted throughout the colon proximal to this point suggestive of a degree of luminal narrowing and stricture. There is sigmoid diverticulosis and scattered colonic diverticula without active inflammatory changes. There is no evidence of small-bowel obstruction. There is abutment of multiple loops of small bowel to the anterior peritoneal wall compatible with adhesions. The appendix is not visualized and may be surgically absent. There is mild diffuse nodularity of the omentum predominantly in the left upper abdomen (series 2, image 26). Early omental implants are not excluded. There is diastases of anterior abdominal wall musculature with a broad-based shallow hernia with abutment of short segment of transverse colon and distal stomach to the anterior peritoneal wall. There is aortoiliac atherosclerotic disease. No portal venous gas identified. There is no adenopathy. There is degenerative changes of the spine. No acute fracture. IMPRESSION: Cholecystectomy with small amount of fluid adjacent to the cholecystectomy bed may represent an infected fluid or bile leak. Clinical correlation is recommended. A hepatobiliary scintigraphy may provide additional information if a bowel leak is clinically suspected. Postsurgical changes of hysterectomy with scarring in the pelvis. Focal area of persistent narrowing of the sigmoid colon in this region is concerning for stricture possibly related to adhesion with associated mild obstruction. Large amount of stool noted proximal to point. No evidence of small-bowel obstruction. Colonic diverticulosis without active inflammation. Stable appearing mild right hydronephrosis. Underlying UPJ stricture is not excluded. Electronically Signed   By: AAnner CreteM.D.   On: 10/07/2015 02:50   Dg Abd 2 Views  10/08/2015  CLINICAL DATA:  Abdominal pain with peritoneal carcinomatosis. Ovarian carcinoma EXAM: ABDOMEN - 2 VIEW COMPARISON:  CT  abdomen and pelvis October 07, 2015 FINDINGS: There is fluid throughout the colon. There is no bowel dilatation or air-fluid level suggesting obstruction. No free air. Lung bases are clear. There are small probable phleboliths in the pelvis. There are surgical clips in the upper right abdomen. IMPRESSION: Fluid throughout the colon. This  finding may be normal but also may be indicative of early colonic ileus. No bowel obstruction evident. No free air. Electronically Signed   By: Lowella Grip III M.D.   On: 10/08/2015 15:00    Review of Systems  Constitutional: Negative.   HENT: Negative.   Eyes: Negative.   Respiratory: Negative.   Cardiovascular: Negative.   Gastrointestinal: Positive for heartburn, nausea, vomiting, abdominal pain and constipation.  Genitourinary: Negative.   Musculoskeletal: Negative.   Skin: Negative.   Neurological: Negative.   Endo/Heme/Allergies: Negative.   Psychiatric/Behavioral: Negative.    Blood pressure 157/79, pulse 87, temperature 98.2 F (36.8 C), temperature source Oral, resp. rate 20, SpO2 95 %. Physical Exam  Constitutional: She is oriented to person, place, and time. She appears well-developed and well-nourished.  HENT:  Head: Normocephalic and atraumatic.  Eyes: Conjunctivae and EOM are normal. Pupils are equal, round, and reactive to light.  Neck: Normal range of motion. Neck supple.  Cardiovascular: Normal rate, regular rhythm and normal heart sounds.   Respiratory: Effort normal and breath sounds normal.  GI: Soft. Bowel sounds are normal.  There is minimal distension. There is mild tenderness diffusely but no guarding or peritonitis. Incisions are well healed  Musculoskeletal: Normal range of motion.  Neurological: She is alert and oriented to person, place, and time.  Skin: Skin is warm and dry.  Psychiatric: She has a normal mood and affect. Her behavior is normal.    Assessment/Plan:  The patient appears to have abdominal pain which  is likely related to her metastatic ovarian cancer. She does not currently show signs of obstruction. She may have a partial obstruction from tumor implants versus constipation. At this point I would agree with medical admission. I would recommend consulting oncology and gastroenterology. If the nausea and vomiting resolves then I think she could potentially get liquids over the weekend and a gentle bowel prep to try to clear out the stool in her colon. We will follow her closely with you.  TOTH III,PAUL S 10/08/2015, 7:21 PM

## 2015-10-09 DIAGNOSIS — K5669 Other intestinal obstruction: Secondary | ICD-10-CM | POA: Diagnosis not present

## 2015-10-09 DIAGNOSIS — K5900 Constipation, unspecified: Secondary | ICD-10-CM | POA: Diagnosis not present

## 2015-10-09 DIAGNOSIS — C569 Malignant neoplasm of unspecified ovary: Secondary | ICD-10-CM | POA: Diagnosis not present

## 2015-10-09 DIAGNOSIS — R110 Nausea: Secondary | ICD-10-CM | POA: Diagnosis not present

## 2015-10-09 DIAGNOSIS — R112 Nausea with vomiting, unspecified: Secondary | ICD-10-CM | POA: Diagnosis not present

## 2015-10-09 DIAGNOSIS — C482 Malignant neoplasm of peritoneum, unspecified: Secondary | ICD-10-CM | POA: Diagnosis not present

## 2015-10-09 DIAGNOSIS — R1084 Generalized abdominal pain: Secondary | ICD-10-CM | POA: Diagnosis not present

## 2015-10-09 LAB — CBC
HCT: 35.3 % — ABNORMAL LOW (ref 36.0–46.0)
Hemoglobin: 11.4 g/dL — ABNORMAL LOW (ref 12.0–15.0)
MCH: 30.2 pg (ref 26.0–34.0)
MCHC: 32.3 g/dL (ref 30.0–36.0)
MCV: 93.4 fL (ref 78.0–100.0)
PLATELETS: 212 10*3/uL (ref 150–400)
RBC: 3.78 MIL/uL — AB (ref 3.87–5.11)
RDW: 16 % — ABNORMAL HIGH (ref 11.5–15.5)
WBC: 9 10*3/uL (ref 4.0–10.5)

## 2015-10-09 LAB — BASIC METABOLIC PANEL
Anion gap: 8 (ref 5–15)
BUN: 20 mg/dL (ref 6–20)
CALCIUM: 8.7 mg/dL — AB (ref 8.9–10.3)
CO2: 28 mmol/L (ref 22–32)
CREATININE: 0.93 mg/dL (ref 0.44–1.00)
Chloride: 102 mmol/L (ref 101–111)
GFR, EST NON AFRICAN AMERICAN: 60 mL/min — AB (ref >60)
Glucose, Bld: 100 mg/dL — ABNORMAL HIGH (ref 65–99)
Potassium: 3.6 mmol/L (ref 3.5–5.1)
Sodium: 138 mmol/L (ref 135–145)

## 2015-10-09 LAB — TSH: TSH: 0.829 u[IU]/mL (ref 0.350–4.500)

## 2015-10-09 MED ORDER — MAGNESIUM HYDROXIDE 400 MG/5ML PO SUSP
960.0000 mL | Freq: Once | ORAL | Status: AC
  Administered 2015-10-09: 960 mL via RECTAL
  Filled 2015-10-09: qty 240

## 2015-10-09 MED ORDER — SENNA 8.6 MG PO TABS
2.0000 | ORAL_TABLET | Freq: Every day | ORAL | Status: DC
  Administered 2015-10-09 – 2015-10-11 (×3): 17.2 mg via ORAL
  Filled 2015-10-09 (×3): qty 2

## 2015-10-09 MED ORDER — DOCUSATE SODIUM 100 MG PO CAPS
100.0000 mg | ORAL_CAPSULE | Freq: Two times a day (BID) | ORAL | Status: DC
  Administered 2015-10-09 – 2015-10-11 (×4): 100 mg via ORAL
  Filled 2015-10-09 (×4): qty 1

## 2015-10-09 NOTE — Progress Notes (Signed)
SMOG enema with minimal results.

## 2015-10-09 NOTE — Progress Notes (Signed)
PROGRESS NOTE  Lynn Ramirez [id number] DOB: [birth year] DOA: 10/08/2015 PCP: Antony Blackbird, MD Brief history 75 year old female with a history of ovarian adenocarcinoma followed by Dr. Benay Spice, COPD, hyperlipidemia hypertension presents with 2 day history of worsening abdominal pain. The patientnormally struggles with some constipation, but she states that she had a bowel movement on 10/05/2015 morning.  On 10/06/2015, the patient began developing abdominal pain that continued to worsen. She went to emergency department on the morning of 10/07/2015. CT of the abdomen and pelvis at that time revealed diffuse omental nodularity. There was also a focal area of persistent narrowing of the sigmoid colon concerning for possible stricture. There was a large amount of stool without any small bowel obstruction. There were multiple loops of small bowel abutting the peritoneal wall consistent with adhesions. The patient was given opioids with symptomatic relief. She was discharged home in stable condition from the emergency department. Unfortunately, the patient continued to have abdominal pain. She used a fleets enema which only resulted in a small amount of stool, and she had persistent abdominal pain. She went to the cancer center on3/31/17. Because of concerns for possible obstruction, the patient was sent back to the emergency department for further evaluation. In the emergency department, 2 view abdominal x-ray Was negative for any bowel dilatation or air-fluid collections to suggest obstruction. WBC was 13.1.  Assessment/Plan: Abdominal pain/constipation/obstipation -likely due to constipation, possible obstipation which is likely due to adhesions and peritoneal carcinomatosis -4 BMs with SMOG enema--abd pain somewhat improved -10/07/2015 CT abdomen and pelvis with a large amount of stool and possible narrowing of the sigmoid colon -10/09/15-consulted GI--spoke with Dr. Havery Moros -Gen.  Surgery has been consult appreciated -IV fluids -Judicious opioids for pain relief -TSH--0.829 -start Colace and Senokot Ovarian serous carcinoma -follows Dr. Benay Spice -there is clinical evidence of disease progression per Dr. Benay Spice -last chemotherapy 03/10/2015 Nausea and vomiting -LFTs and lipase unremarkable -Symptomatically treatment -2 view abdominal x-ray negative for obstruction -remain on clear liquids, advance as tolerated -improved with BMs COPD -Stable without exacerbation -Continue Symbicort and Spiriva Hypertension -Elevated BP Partly due to pain -Hold Maxzide for now Leukocytosis -Afebrile and hemodynamically stable -May be stress demargination -Urinalysis negative for pyuria -improved   Family Communication:   Pt at beside Disposition Plan:   Home 1-2 days, pending clinical improvement       Procedures/Studies: Ct Abdomen Pelvis W Contrast  10/07/2015  CLINICAL DATA:  75 year old female with ovarian cancer presenting with worsening lower abdominal pain. EXAM: CT ABDOMEN AND PELVIS WITH CONTRAST TECHNIQUE: Multidetector CT imaging of the abdomen and pelvis was performed using the standard protocol following bolus administration of intravenous contrast. CONTRAST:  171mL ISOVUE-300 IOPAMIDOL (ISOVUE-300) INJECTION 61% COMPARISON:  CT dated 08/07/2015 FINDINGS: The visualized lung bases are clear. Mild right pleural based thickening/scarring. No intra-abdominal free air or free fluid. Cholecystectomy. Small amount of fluid is noted in the subhepatic area adjacent to the cholecystectomy clips. This may represent a small fluid collection with possible superimposed infection or leakage of bile. Mild fatty infiltration of the liver. There is mild intrahepatic biliary ductal dilatation, possibly post cholecystectomy. The pancreas, spleen, and adrenal glands appear unremarkable. There is a solitary right kidney. An extrarenal pelvis noted on the right. There is mild  right hydronephrosis similar to prior study. No calcified stone identified. A focal area of stricture at the right UPJ is not excluded. The right ureter and urinary bladder appear unremarkable. There is  hypo enhancement of the right renal parenchyma possibly related to hydronephrosis. Correlation with urinalysis recommended to exclude UTI. Hysterectomy. There is postsurgical changes and scarring within the pelvis. There is a 1 cm calcific density in the right hemipelvis which may be dystrophic calcification and related to prior inflammation or a diverticulith. There is a focal area of persistent narrowing in the sigmoid colon adjacent to this calcification likely related to underlying adhesion or stricture. Large amount of stool noted throughout the colon proximal to this point suggestive of a degree of luminal narrowing and stricture. There is sigmoid diverticulosis and scattered colonic diverticula without active inflammatory changes. There is no evidence of small-bowel obstruction. There is abutment of multiple loops of small bowel to the anterior peritoneal wall compatible with adhesions. The appendix is not visualized and may be surgically absent. There is mild diffuse nodularity of the omentum predominantly in the left upper abdomen (series 2, image 26). Early omental implants are not excluded. There is diastases of anterior abdominal wall musculature with a broad-based shallow hernia with abutment of short segment of transverse colon and distal stomach to the anterior peritoneal wall. There is aortoiliac atherosclerotic disease. No portal venous gas identified. There is no adenopathy. There is degenerative changes of the spine. No acute fracture. IMPRESSION: Cholecystectomy with small amount of fluid adjacent to the cholecystectomy bed may represent an infected fluid or bile leak. Clinical correlation is recommended. A hepatobiliary scintigraphy may provide additional information if a bowel leak is clinically  suspected. Postsurgical changes of hysterectomy with scarring in the pelvis. Focal area of persistent narrowing of the sigmoid colon in this region is concerning for stricture possibly related to adhesion with associated mild obstruction. Large amount of stool noted proximal to point. No evidence of small-bowel obstruction. Colonic diverticulosis without active inflammation. Stable appearing mild right hydronephrosis. Underlying UPJ stricture is not excluded. Electronically Signed   By: Anner Crete M.D.   On: 10/07/2015 02:50   Dg Abd 2 Views  10/08/2015  CLINICAL DATA:  Abdominal pain with peritoneal carcinomatosis. Ovarian carcinoma EXAM: ABDOMEN - 2 VIEW COMPARISON:  CT abdomen and pelvis October 07, 2015 FINDINGS: There is fluid throughout the colon. There is no bowel dilatation or air-fluid level suggesting obstruction. No free air. Lung bases are clear. There are small probable phleboliths in the pelvis. There are surgical clips in the upper right abdomen. IMPRESSION: Fluid throughout the colon. This finding may be normal but also may be indicative of early colonic ileus. No bowel obstruction evident. No free air. Electronically Signed   By: Lowella Grip III M.D.   On: 10/08/2015 15:00         Subjective: Patient had 4 loose bowel movements after smog enema. Abdominal pain is somewhat improved with still significant. Denies any fevers, chills, chest pain, shortness breath, headache, neck pain, fevers, chills, coughing, hemoptysis, nausea, vomiting  Objective: Filed Vitals:   10/08/15 1843 10/08/15 2014 10/08/15 2018 10/09/15 0530  BP: 157/79  144/59 125/61  Pulse: 87  88 88  Temp: 98.2 F (36.8 C)  98.5 F (36.9 C) 98.8 F (37.1 C)  TempSrc: Oral Oral Oral Oral  Resp: 20  16 20   Height:  5\' 1"  (1.549 m)    Weight:  82.101 kg (181 lb)    SpO2: 95%  91% 92%   No intake or output data in the 24 hours ending 10/09/15 0811 Weight change:  Exam:   General:  Pt is alert,  follows commands appropriately, not in  acute distress  HEENT: No icterus, No thrush, No neck mass, [place]/AT  Cardiovascular: RRR, S1/S2, no rubs, no gallops  Respiratory: iminished breath sounds with bibasilar rales. No wheezing. Good air movement.  Abdomen: Soft/+BS, mild lower abdominal tenderness without any rebound, non distended, no guarding  Extremities: No edema, No lymphangitis, No petechiae, No rashes, no synovitis  Data Reviewed: Basic Metabolic Panel:  Recent Labs Lab 10/07/15 0009 10/08/15 1714 10/09/15 0407  NA 138 133* 138  K 3.7 3.6 3.6  CL 100* 95* 102  CO2 28 28 28   GLUCOSE 150* 141* 100*  BUN 28* 21* 20  CREATININE 1.07* 1.10* 0.93  CALCIUM 10.1 9.4 8.7*   Liver Function Tests:  Recent Labs Lab 10/07/15 0009 10/08/15 1714  AST 19 15  ALT 19 16  ALKPHOS 80 74  BILITOT 0.8 0.9  PROT 7.9 7.2  ALBUMIN 4.1 3.7    Recent Labs Lab 10/07/15 0009 10/08/15 1714  LIPASE 27 20   No results for input(s): AMMONIA in the last 168 hours. CBC:  Recent Labs Lab 10/07/15 0009 10/08/15 1714 10/09/15 0407  WBC 9.6 13.1* 9.0  NEUTROABS  --  12.0*  --   HGB 13.2 13.0 11.4*  HCT 40.3 41.2 35.3*  MCV 92.9 93.8 93.4  PLT 281 233 212   Cardiac Enzymes: No results for input(s): CKTOTAL, CKMB, CKMBINDEX, TROPONINI in the last 168 hours. BNP: Invalid input(s): POCBNP CBG: No results for input(s): GLUCAP in the last 168 hours.  No results found for this or any previous visit (from the past 240 hour(s)).   Scheduled Meds: . antiseptic oral rinse  15 mL Mouth Rinse TID  . atorvastatin  10 mg Oral Daily  . budesonide-formoterol  2 puff Inhalation BID  . enoxaparin (LOVENOX) injection  40 mg Subcutaneous Q24H  . famotidine  20 mg Oral Daily  . tiotropium  18 mcg Inhalation Daily  . vitamin B-12  100 mcg Oral Daily   Continuous Infusions: . 0.9 % NaCl with KCl 20 mEq / L 75 mL/hr at 10/08/15 2057     Shaune Westfall, DO  [facility] Pager  [phone]  If 7PM-7AM, please contact night-coverage [email or web address] Password [email or web address] 10/09/2015, 8:11 AM

## 2015-10-09 NOTE — Consult Note (Signed)
Referring Provider: [facility], Dr. Carles Collet Primary Care Physician:  Antony Blackbird, MD Primary Gastroenterologist:  Dr. Deatra Ina  Reason for Consultation:    HPI: Lynn Ramirez is a 75 y.o. female with a history of ovarian adenocarcinoma followed by Dr. Benay Spice, COPD, hyperlipidemia hypertension presented on 3/31 with 2 day history of worsening abdominal pain. The patient normally struggles with some constipation, but she states that she had a bowel movement on 10/05/2015 morning.  On 10/06/2015, the patient began developing abdominal pain that continued to worsen. She went to emergency department on the morning of 10/07/2015. CT of the abdomen and pelvis at that time revealed diffuse omental nodularity. There was also a focal area of persistent narrowing of the sigmoid colon concerning for possible stricture. There was a large amount of stool without any small bowel obstruction. There were multiple loops of small bowel abutting the peritoneal wall consistent with adhesions. The patient was given opioids with symptomatic relief. She was discharged home in stable condition from the emergency department. Unfortunately, the patient continued to have abdominal pain. She used a fleets enema which only resulted in a small amount of stool, and she had persistent abdominal pain. She went to the cancer center, 10/08/15. Because of concerns for possible obstruction, the patient was sent back to the emergency department for further evaluation. In the emergency department, 2 view abdominal x-ray Was negative for any bowel dilatation or air-fluid collections to suggest obstruction. WBC was 13.1. BMP and hepatic enzymes and lipase were unremarkable. The patient was afebrile and hemodynamically stable. Urinalysis was negative for pyuria. Gen. Surgery was consulted. She received a SMOG enema last night with stool production and her pain was significantly improved. Tolerate clears this AM.  CT scan on 3/30 as follows:  Focal  area of persistent narrowing of the sigmoid colon in this region is concerning for stricture possibly related to adhesion with associated mild obstruction. Large amount of stool noted proximal to point. No evidence of small-bowel obstruction.  Colonic diverticulosis without active inflammation.  Stable appearing mild right hydronephrosis. Underlying UPJ stricture is not excluded."  Colonoscopy 08/2014 by Dr. Deatra Ina showed severe diverticulosis, colon polyps (hyperplastic polyp and benign mucosa), and internal hemorrhoids.  Study was incomplete due to "There was severe diverticulosis noted in the sigmoid colon with associated luminal narrowing, colonic spasm, angulation and muscular hypertrophy."   Past Medical History  Diagnosis Date  . H/O hydronephrosis   . Hypertension   . Emphysema   . COPD (chronic obstructive pulmonary disease) ([place])   . Cancer ([place])     ovarian  . Eczema     hands  . GERD (gastroesophageal reflux disease)   . History of transfusion     age 17  . Difficulty sleeping     Past Surgical History  Procedure Laterality Date  . Nephrectomy    . Tubal ligation    . Hand surgery Right 1980's  . Thoracentesis      several  . Robotic assisted total hysterectomy with bilateral salpingo oopherectomy Bilateral 12/29/2014    Procedure: ROBOTIC ASSISTED TOTAL LAPAROSCOPIC HYSTERECTOMY WITH BILATERAL SALPINGO OOPHORECTOMY AND OOMENTECTOMY WITH RADICAL TUMOR [place] ;  Surgeon: Everitt Amber, MD;  Location: [facility];  Service: Gynecology;  Laterality: Bilateral;  . Laparotomy N/A 12/29/2014    Procedure:  LAPAROTOMY;  Surgeon: Everitt Amber, MD;  Location: [facility];  Service: Gynecology;  Laterality: N/A;  . Tonsillectomy    . Cholecystectomy N/A 08/09/2015    Procedure: Attempted LAPAROSCOPIC coverted  open  CHOLECYSTECTOMY WITH INTRAOPERATIVE CHOLANGIOGRAM;  Surgeon: Autumn Messing III, MD;  Location: [facility];  Service: General;  Laterality: N/A;    Prior to Admission medications     Medication Sig Start Date End Date Taking? Authorizing Provider  Acetaminophen (TYLENOL) 325 MG CAPS Take 1 tablet by mouth 3 (three) times daily as needed (pain).    Yes Historical Provider, MD  antiseptic oral rinse (BIOTENE) LIQD 15 mLs by Mouth Rinse route 3 (three) times daily.   Yes Historical Provider, MD  atorvastatin (LIPITOR) 10 MG tablet Take 10 mg by mouth daily.   Yes Historical Provider, MD  Biotin 2500 MCG CAPS Take 1 tablet by mouth daily.   Yes Historical Provider, MD  budesonide-formoterol (SYMBICORT) 160-4.5 MCG/ACT inhaler Inhale 2 puffs into the lungs 2 (two) times daily. 09/24/15  Yes Collene Gobble, MD  Cyanocobalamin (B-12 IJ) Inject 1 each as directed every 30 (thirty) days. Reported on 10/07/2015   Yes Historical Provider, MD  dexamethasone (DECADRON) 4 MG tablet Take 10 mg (2.5 tablets) at 10:00PM night before chemotherapy and repeat at 6:00AM the morning of chemotherapy Patient taking differently: Take 10 mg by mouth daily as needed. Take 10 mg (2.5 tablets) at 10:00PM night before chemotherapy and repeat at 6:00AM the morning of chemotherapy 10/07/15  Yes Ladell Pier, MD  famotidine (PEPCID) 10 MG tablet Take 20 mg by mouth daily.    Yes Historical Provider, MD  fluocinonide cream (LIDEX) [id number] % Apply 1 application topically 2 (two) times daily as needed (For ezcema).    Yes Historical Provider, MD  lidocaine-prilocaine (EMLA) cream Apply to port site one hour prior to use. Do not rub in. Cover with plastic. Patient taking differently: Apply 1 application topically daily as needed. Apply to port site one hour prior to use. Do not rub in. Cover with plastic. 10/28/14  Yes Ladell Pier, MD  Omega-3 Fatty Acids (OMEGA-3 PO) Take 1 capsule by mouth daily.   Yes Historical Provider, MD  ondansetron (ZOFRAN-ODT) 4 MG disintegrating tablet Take 1 tablet (4 mg total) by mouth daily as needed for nausea or vomiting. 10/07/15  Yes Ladell Pier, MD  oxyCODONE (OXY  IR/ROXICODONE) 5 MG immediate release tablet Take 1 tablet by mouth every 4 (four) hours as needed for severe pain.  08/11/15  Yes Historical Provider, MD  PROAIR HFA 108 (90 BASE) MCG/ACT inhaler INHALE 2 PUFFS BY MOUTH FOUR TIMES DAILY AS NEEDED FOR WHEEZING 07/08/14  Yes Kathee Delton, MD  Tiotropium Bromide Monohydrate (SPIRIVA RESPIMAT) 2.5 MCG/ACT AERS 2 pffs each am Patient taking differently: Inhale 2 puffs into the lungs every morning. 2 pffs each am 07/14/15  Yes Tanda Rockers, MD  triamterene-hydrochlorothiazide (MAXZIDE-25) 37.5-25 MG tablet Take 0.5 tablets by mouth daily.    Yes Historical Provider, MD  vitamin B-12 (CYANOCOBALAMIN) 100 MCG tablet Take 100 mcg by mouth daily.   Yes Historical Provider, MD    Current Facility-Administered Medications  Medication Dose Route Frequency Provider Last Rate Last Dose  . 0.9 % NaCl with KCl 20 mEq/ L  infusion   Intravenous Continuous Orson Eva, MD 75 mL/hr at 10/08/15 2057    . acetaminophen (TYLENOL) tablet 650 mg  650 mg Oral Q6H PRN Orson Eva, MD       Or  . acetaminophen (TYLENOL) suppository 650 mg  650 mg Rectal Q6H PRN Orson Eva, MD      . albuterol (PROVENTIL) (2.5 MG/3ML) 0.083% nebulizer solution 2.5 mg  2.5  mg Inhalation Q6H PRN Orson Eva, MD      . antiseptic oral rinse (BIOTENE) solution 15 mL  15 mL Mouth Rinse TID Orson Eva, MD   15 mL at 10/08/15 2200  . atorvastatin (LIPITOR) tablet 10 mg  10 mg Oral Daily Orson Eva, MD   10 mg at 10/08/15 2213  . budesonide-formoterol (SYMBICORT) 160-4.5 MCG/ACT inhaler 2 puff  2 puff Inhalation BID Orson Eva, MD   2 puff at 10/08/15 2230  . docusate sodium (COLACE) capsule 100 mg  100 mg Oral BID Orson Eva, MD      . enoxaparin (LOVENOX) injection 40 mg  40 mg Subcutaneous Q24H Orson Eva, MD   40 mg at 10/08/15 2200  . famotidine (PEPCID) tablet 20 mg  20 mg Oral Daily Orson Eva, MD      . morphine 2 MG/ML injection 2 mg  2 mg Intravenous Q3H PRN Orson Eva, MD   2 mg at 10/09/15 0610  .  ondansetron (ZOFRAN) tablet 4 mg  4 mg Oral Q6H PRN Orson Eva, MD       Or  . ondansetron [facility]) injection 4 mg  4 mg Intravenous Q6H PRN Orson Eva, MD      . senna (SENOKOT) tablet 17.2 mg  2 tablet Oral Daily Orson Eva, MD      . tiotropium [facility]) inhalation capsule 18 mcg  18 mcg Inhalation Daily Orson Eva, MD      . vitamin B-12 (CYANOCOBALAMIN) tablet 100 mcg  100 mcg Oral Daily Orson Eva, MD        Allergies as of 10/08/2015 - Review Complete 10/08/2015  Allergen Reaction Noted  . Latex Rash 11/02/2014  . Penicillins Other (See Comments) 03/06/2012    Family History  Problem Relation Age of Onset  . Diabetes Father   . Lung cancer Father 67    metastasis to liver  . Pancreatic cancer Mother 9  . Stroke Paternal Grandfather   . Colon cancer Neg Hx   . Stomach cancer Neg Hx   . Heart disease Maternal Aunt   . Diabetes Paternal Uncle   . Heart Problems Maternal Grandmother   . Heart Problems Maternal Grandfather   . Diabetes Paternal Grandmother   . Stroke Paternal Grandmother   . Heart Problems Paternal Uncle   . Cancer Cousin     unknown type  . Breast cancer Cousin     dx. 40s  . Leukemia Cousin     dx. 16-17    Social History   Social History  . Marital Status: Married    Spouse Name: N/A  . Number of Children: 3  . Years of Education: N/A   Occupational History  . RETIRED    Social History Main Topics  . Smoking status: Former Smoker -- 1.00 packs/day for 40 years    Types: Cigarettes    Quit date: 07/10/2013  . Smokeless tobacco: Never Used  . Alcohol Use: 0.0 oz/week    0 Standard drinks or equivalent per week     Comment: social   . Drug Use: No  . Sexual Activity: Not on file   Other Topics Concern  . Not on file   Social History Narrative    Review of Systems: Ten point ROS is O/W negative except as mentioned in HPI.  Physical Exam: Vital signs in last 24 hours: Temp:  [98.2 F (36.8 C)-98.8 F (37.1 C)] 98.8 F (37.1 C)  (04/01 0530) Pulse Rate:  [87-102] 88 (  04/01 0530) Resp:  [16-20] 20 (04/01 0530) BP: (125-157)/(59-135) 125/61 mmHg (04/01 0530) SpO2:  [91 %-95 %] 92 % (04/01 0530) Weight:  [181 lb (82.101 kg)-181 lb 4.8 oz (82.237 kg)] 181 lb (82.101 kg) 10-22-22 2014) Last BM Date: 10/09/15 General:  Alert, Well-developed, well-nourished, pleasant and cooperative in NAD Head:  Normocephalic and atraumatic. Eyes:  Sclera clear, no icterus.  Conjunctiva pink. Ears:  Normal auditory acuity. Mouth:  No deformity or lesions.   Lungs:  Clear throughout to auscultation.  No wheezes, crackles, or rhonchi.  Heart:  Regular rate and rhythm; no murmurs, clicks, rubs,  or gallops. Abdomen:  Soft, mild lower abdominal TTP without rebound or guarding,no hsm.   Rectal:  Deferred  Msk:  Symmetrical without gross deformities. Pulses:  Normal pulses noted. Extremities:  Without clubbing or edema. Neurologic:  Alert and  oriented x4;  grossly normal neurologically. Skin:  Intact without significant lesions or rashes. Psych:  Alert and cooperative. Normal mood and affect.  Lab Results:  Recent Labs  10/07/15 0009 October 22, 2015 1714 10/09/15 0407  WBC 9.6 13.1* 9.0  HGB 13.2 13.0 11.4*  HCT 40.3 41.2 35.3*  PLT 281 233 212   BMET  Recent Labs  10/07/15 0009 22-Oct-2015 1714 10/09/15 0407  NA 138 133* 138  K 3.7 3.6 3.6  CL 100* 95* 102  CO2 28 28 28   GLUCOSE 150* 141* 100*  BUN 28* 21* 20  CREATININE 1.07* 1.10* 0.93  CALCIUM 10.1 9.4 8.7*   LFT  Recent Labs  22-Oct-2015 1714  PROT 7.2  ALBUMIN 3.7  AST 15  ALT 16  ALKPHOS 74  BILITOT 0.9   Studies/Results: Dg Abd 2 Views  10/22/2015  CLINICAL DATA:  Abdominal pain with peritoneal carcinomatosis. Ovarian carcinoma EXAM: ABDOMEN - 2 VIEW COMPARISON:  CT abdomen and pelvis October 07, 2015 FINDINGS: There is fluid throughout the colon. There is no bowel dilatation or air-fluid level suggesting obstruction. No free air. Lung bases are clear. There are  small probable phleboliths in the pelvis. There are surgical clips in the upper right abdomen. IMPRESSION: Fluid throughout the colon. This finding may be normal but also may be indicative of early colonic ileus. No bowel obstruction evident. No free air. Electronically Signed   By: Lowella Grip III M.D.   On: October 22, 2015 15:00   IMPRESSION: #1  Abdominal pain:  Likely due to constipation / obstipation, stenosis possibly from diverticulosis, adhesions or peritoneal carcinomatosis.  The patient only had small amount of stool with fleets enema but had good relief with SMOG enema and improved this AM. -Try SMOG enema again, twice daily -miralax PO  ZEHR, JESSICA D.  10/09/2015, 8:53 AM   Attending Addendum I have taken an interval history, reviewed the chart, and examined the patient. I agree with the Advanced Practitioner's note and impression.  Patient has a known severe stenosis of the sigmoid colon - colonoscopy last year was not able to traverse the sigmoid due to severe suspected diverticular disease vs. adhesions. Suspect this is the culprit of her symptoms, perhaps worsened with carcinomatosis. She has had benefit with SMOG enema thus far and is improved today, tolerated clear liquids. At time of exam she did not have any significant pain. Recommend she continue with SMOG enema x 1 now and again later this afternoon if this provided benefit. If tolerating PO would also place high dose Miralax as well moving forward (double dose twice daily, titrate as needed) with goal of loose stools. Hopefully  she does well with medical therapy for this issue and no invasive intervention is needed. We will sign off for now as she seems to be improving, please re-consult if she fails to improve or worsens.   Courtland Cellar, MD [facility] Pager [phone]  .

## 2015-10-09 NOTE — Progress Notes (Signed)
  Subjective: Feels a little better today. Had several bm's last night after enema  Objective: Vital signs in last 24 hours: Temp:  [98.2 F (36.8 C)-98.8 F (37.1 C)] 98.8 F (37.1 C) (04/01 0530) Pulse Rate:  [87-102] 88 (04/01 0530) Resp:  [16-20] 20 (04/01 0530) BP: (125-157)/(59-135) 125/61 mmHg (04/01 0530) SpO2:  [91 %-95 %] 92 % (04/01 0530) Weight:  [82.101 kg (181 lb)-82.237 kg (181 lb 4.8 oz)] 82.101 kg (181 lb) 09-Oct-2022 2014) Last BM Date: 10/09/15  Intake/Output from previous day:   Intake/Output this shift:    Resp: clear to auscultation bilaterally Cardio: regular rate and rhythm GI: soft, less tender. good bs  Lab Results:   Recent Labs  10-09-15 1714 10/09/15 0407  WBC 13.1* 9.0  HGB 13.0 11.4*  HCT 41.2 35.3*  PLT 233 212   BMET  Recent Labs  10-09-15 1714 10/09/15 0407  NA 133* 138  K 3.6 3.6  CL 95* 102  CO2 28 28  GLUCOSE 141* 100*  BUN 21* 20  CREATININE 1.10* 0.93  CALCIUM 9.4 8.7*   PT/INR No results for input(s): LABPROT, INR in the last 72 hours. ABG No results for input(s): PHART, HCO3 in the last 72 hours.  Invalid input(s): PCO2, PO2  Studies/Results: Dg Abd 2 Views  09-Oct-2015  CLINICAL DATA:  Abdominal pain with peritoneal carcinomatosis. Ovarian carcinoma EXAM: ABDOMEN - 2 VIEW COMPARISON:  CT abdomen and pelvis October 07, 2015 FINDINGS: There is fluid throughout the colon. There is no bowel dilatation or air-fluid level suggesting obstruction. No free air. Lung bases are clear. There are small probable phleboliths in the pelvis. There are surgical clips in the upper right abdomen. IMPRESSION: Fluid throughout the colon. This finding may be normal but also may be indicative of early colonic ileus. No bowel obstruction evident. No free air. Electronically Signed   By: Lowella Grip III M.D.   On: 10-09-2015 15:00    Anti-infectives: Anti-infectives    None      Assessment/Plan: s/p * No surgery found * Advance  diet. i would agree with full liquids tonight if she continues to do well No indication for urgent surgery Will discuss restarting chemo with Dr. Benay Spice in Monday     TOTH III,PAUL S 10/09/2015

## 2015-10-10 ENCOUNTER — Observation Stay (HOSPITAL_COMMUNITY): Payer: Medicare Other

## 2015-10-10 DIAGNOSIS — K5669 Other intestinal obstruction: Secondary | ICD-10-CM | POA: Diagnosis not present

## 2015-10-10 DIAGNOSIS — C482 Malignant neoplasm of peritoneum, unspecified: Secondary | ICD-10-CM | POA: Diagnosis not present

## 2015-10-10 DIAGNOSIS — K5660 Unspecified intestinal obstruction: Secondary | ICD-10-CM | POA: Diagnosis not present

## 2015-10-10 DIAGNOSIS — K5900 Constipation, unspecified: Secondary | ICD-10-CM | POA: Diagnosis not present

## 2015-10-10 DIAGNOSIS — R110 Nausea: Secondary | ICD-10-CM | POA: Diagnosis not present

## 2015-10-10 DIAGNOSIS — R112 Nausea with vomiting, unspecified: Secondary | ICD-10-CM | POA: Diagnosis not present

## 2015-10-10 DIAGNOSIS — K56699 Other intestinal obstruction unspecified as to partial versus complete obstruction: Secondary | ICD-10-CM | POA: Insufficient documentation

## 2015-10-10 DIAGNOSIS — C569 Malignant neoplasm of unspecified ovary: Secondary | ICD-10-CM | POA: Diagnosis not present

## 2015-10-10 DIAGNOSIS — R1084 Generalized abdominal pain: Secondary | ICD-10-CM | POA: Diagnosis not present

## 2015-10-10 LAB — BASIC METABOLIC PANEL
Anion gap: 9 (ref 5–15)
BUN: 15 mg/dL (ref 6–20)
CALCIUM: 9.5 mg/dL (ref 8.9–10.3)
CO2: 30 mmol/L (ref 22–32)
CREATININE: 0.92 mg/dL (ref 0.44–1.00)
Chloride: 102 mmol/L (ref 101–111)
GFR calc Af Amer: >60 mL/min (ref >60)
GLUCOSE: 114 mg/dL — AB (ref 65–99)
Potassium: 4.3 mmol/L (ref 3.5–5.1)
Sodium: 141 mmol/L (ref 135–145)

## 2015-10-10 LAB — URINE CULTURE

## 2015-10-10 LAB — MAGNESIUM: Magnesium: 2.1 mg/dL (ref 1.7–2.4)

## 2015-10-10 MED ORDER — LORAZEPAM 0.5 MG PO TABS
0.5000 mg | ORAL_TABLET | Freq: Four times a day (QID) | ORAL | Status: DC | PRN
Start: 2015-10-10 — End: 2015-10-11
  Filled 2015-10-10: qty 1

## 2015-10-10 MED ORDER — MAGNESIUM CITRATE PO SOLN
1.0000 | Freq: Once | ORAL | Status: AC
  Administered 2015-10-10: 1 via ORAL
  Filled 2015-10-10: qty 296

## 2015-10-10 MED ORDER — POLYETHYLENE GLYCOL 3350 17 G PO PACK
17.0000 g | PACK | Freq: Two times a day (BID) | ORAL | Status: DC
  Administered 2015-10-11: 17 g via ORAL

## 2015-10-10 MED ORDER — POLYETHYLENE GLYCOL 3350 17 G PO PACK
17.0000 g | PACK | Freq: Every day | ORAL | Status: DC
  Administered 2015-10-10: 17 g via ORAL
  Filled 2015-10-10: qty 1

## 2015-10-10 NOTE — Progress Notes (Signed)
PROGRESS NOTE  Lynn Ramirez [id number] DOB: Oct 01, 1940 DOA: 10/08/2015 PCP: Antony Blackbird, MD   Brief history 75 year old female with a history of ovarian adenocarcinoma followed by Dr. Benay Spice, COPD, hyperlipidemia hypertension presents with 2 day history of worsening abdominal pain. The patientnormally struggles with some constipation, but she states that she had a bowel movement on 10/05/2015 morning. On 10/06/2015, the patient began developing abdominal pain that continued to worsen. She went to emergency department on the morning of 10/07/2015. CT of the abdomen and pelvis at that time revealed diffuse omental nodularity. There was also a focal area of persistent narrowing of the sigmoid colon concerning for possible stricture. There was a large amount of stool without any small bowel obstruction. There were multiple loops of small bowel abutting the peritoneal wall consistent with adhesions. The patient was given opioids with symptomatic relief. She was discharged home in stable condition from the emergency department. Unfortunately, the patient continued to have abdominal pain. She used a fleets enema which only resulted in a small amount of stool, and she had persistent abdominal pain. She went to the cancer center on3/31/17. Because of concerns for possible obstruction, the patient was sent back to the emergency department for further evaluation. In the emergency department, 2 view abdominal x-ray Was negative for any bowel dilatation or air-fluid collections to suggest obstruction. WBC was 13.1.  Assessment/Plan: Abdominal pain/constipation/obstipation -likely due to constipation, possible obstipation which is likely due to adhesions and peritoneal carcinomatosis -4 BMs with SMOG enema--abd pain somewhat improved -repeat SMOG--only small BM -10/07/2015 CT abdomen and pelvis with a large amount of stool and possible narrowing of the sigmoid colon -10/09/15-appreciate Dr.  Havery Moros -Gen. Surgery consult appreciated -10/10/15-2 view abdominal x-ray negative for obstruction, but still abundant stool -IV fluids -Judicious opioids for pain relief -TSH--0.829 -start miralax -give mag citrate Ovarian serous carcinoma -follows Dr. Benay Spice -there is clinical evidence of disease progression per Dr. Benay Spice -last chemotherapy 03/10/2015 Nausea and vomiting -LFTs and lipase unremarkable -Symptomatically treatment -10/10/15-2 view abdominal x-ray negative for obstruction, but still abundant stool -remain on full liquids -improved with BMs COPD -Stable without exacerbation -Continue Symbicort and Spiriva Hypertension -Elevated BP Partly due to pain -Hold Maxzide for now Leukocytosis -Afebrile and hemodynamically stable -May be stress demargination -Urinalysis negative for pyuria -improved Anxiety -ativan 0.5mg  q 6hr prn  Family Communication: husband and daughter updated at beside Disposition Plan: Home 1-2 days, pending clinical improvement     Procedures/Studies: Ct Abdomen Pelvis W Contrast  10/07/2015  CLINICAL DATA:  75 year old female with ovarian cancer presenting with worsening lower abdominal pain. EXAM: CT ABDOMEN AND PELVIS WITH CONTRAST TECHNIQUE: Multidetector CT imaging of the abdomen and pelvis was performed using the standard protocol following bolus administration of intravenous contrast. CONTRAST:  11mL ISOVUE-300 IOPAMIDOL (ISOVUE-300) INJECTION 61% COMPARISON:  CT dated 08/07/2015 FINDINGS: The visualized lung bases are clear. Mild right pleural based thickening/scarring. No intra-abdominal free air or free fluid. Cholecystectomy. Small amount of fluid is noted in the subhepatic area adjacent to the cholecystectomy clips. This may represent a small fluid collection with possible superimposed infection or leakage of bile. Mild fatty infiltration of the liver. There is mild intrahepatic biliary ductal dilatation, possibly post  cholecystectomy. The pancreas, spleen, and adrenal glands appear unremarkable. There is a solitary right kidney. An extrarenal pelvis noted on the right. There is mild right hydronephrosis similar to prior study. No calcified stone identified. A focal area of stricture at  the right UPJ is not excluded. The right ureter and urinary bladder appear unremarkable. There is hypo enhancement of the right renal parenchyma possibly related to hydronephrosis. Correlation with urinalysis recommended to exclude UTI. Hysterectomy. There is postsurgical changes and scarring within the pelvis. There is a 1 cm calcific density in the right hemipelvis which may be dystrophic calcification and related to prior inflammation or a diverticulith. There is a focal area of persistent narrowing in the sigmoid colon adjacent to this calcification likely related to underlying adhesion or stricture. Large amount of stool noted throughout the colon proximal to this point suggestive of a degree of luminal narrowing and stricture. There is sigmoid diverticulosis and scattered colonic diverticula without active inflammatory changes. There is no evidence of small-bowel obstruction. There is abutment of multiple loops of small bowel to the anterior peritoneal wall compatible with adhesions. The appendix is not visualized and may be surgically absent. There is mild diffuse nodularity of the omentum predominantly in the left upper abdomen (series 2, image 26). Early omental implants are not excluded. There is diastases of anterior abdominal wall musculature with a broad-based shallow hernia with abutment of short segment of transverse colon and distal stomach to the anterior peritoneal wall. There is aortoiliac atherosclerotic disease. No portal venous gas identified. There is no adenopathy. There is degenerative changes of the spine. No acute fracture. IMPRESSION: Cholecystectomy with small amount of fluid adjacent to the cholecystectomy bed may  represent an infected fluid or bile leak. Clinical correlation is recommended. A hepatobiliary scintigraphy may provide additional information if a bowel leak is clinically suspected. Postsurgical changes of hysterectomy with scarring in the pelvis. Focal area of persistent narrowing of the sigmoid colon in this region is concerning for stricture possibly related to adhesion with associated mild obstruction. Large amount of stool noted proximal to point. No evidence of small-bowel obstruction. Colonic diverticulosis without active inflammation. Stable appearing mild right hydronephrosis. Underlying UPJ stricture is not excluded. Electronically Signed   By: Anner Crete M.D.   On: 10/07/2015 02:50   Dg Abd 2 Views  10/10/2015  CLINICAL DATA:  Abdominal pain.  Follow-up small bowel obstruction. EXAM: ABDOMEN - 2 VIEW COMPARISON:  10/08/2015 FINDINGS: No dilated bowel loops are identified. The bowel gas pattern is unremarkable. No suspicious calcifications or pneumoperitoneum noted. Cholecystectomy clips are present. IMPRESSION: Unremarkable bowel gas pattern. Electronically Signed   By: Margarette Canada M.D.   On: 10/10/2015 10:01   Dg Abd 2 Views  10/08/2015  CLINICAL DATA:  Abdominal pain with peritoneal carcinomatosis. Ovarian carcinoma EXAM: ABDOMEN - 2 VIEW COMPARISON:  CT abdomen and pelvis October 07, 2015 FINDINGS: There is fluid throughout the colon. There is no bowel dilatation or air-fluid level suggesting obstruction. No free air. Lung bases are clear. There are small probable phleboliths in the pelvis. There are surgical clips in the upper right abdomen. IMPRESSION: Fluid throughout the colon. This finding may be normal but also may be indicative of early colonic ileus. No bowel obstruction evident. No free air. Electronically Signed   By: Lowella Grip III M.D.   On: 10/08/2015 15:00         Subjective: patient feels that her abdomen is a little bit more full today.  Denies any vomiting,  diarrhea, abdominal pain. Passing flatus. Denies any fevers, chills, chest pain, status post, coughing, hemoptysis. No abdominal pain has improved.  Objective: Filed Vitals:   10/09/15 1953 10/09/15 2038 10/10/15 0515 10/10/15 0749  BP:  123/52 126/78  Pulse:  81 72 69  Temp:  97.9 F (36.6 C) 98 F (36.7 C)   TempSrc:  Oral Oral   Resp:  16 16 18   Height:      Weight:      SpO2: 93% 95% 96% 94%    Intake/Output Summary (Last 24 hours) at 10/10/15 1257 Last data filed at 10/10/15 0226  Gross per 24 hour  Intake    240 ml  Output      0 ml  Net    240 ml   Weight change:  Exam:   General:  Pt is alert, follows commands appropriately, not in acute distress  HEENT: No icterus, No thrush, No neck mass, [place]/AT  Cardiovascular: RRR, S1/S2, no rubs, no gallops  Respiratory: CTA bilaterally, no wheezing, no crackles, no rhonchi;diminished breath sounds at the bases  Abdomen: Soft/+BS, non tender, non distended, no guarding  Extremities: No edema, No lymphangitis, No petechiae, No rashes, no synovitis  Data Reviewed: Basic Metabolic Panel:  Recent Labs Lab 10/07/15 0009 10/08/15 1714 10/09/15 0407 10/10/15 0349  NA 138 133* 138 141  K 3.7 3.6 3.6 4.3  CL 100* 95* 102 102  CO2 28 28 28 30   GLUCOSE 150* 141* 100* 114*  BUN 28* 21* 20 15  CREATININE 1.07* 1.10* 0.93 0.92  CALCIUM 10.1 9.4 8.7* 9.5  MG  --   --   --  2.1   Liver Function Tests:  Recent Labs Lab 10/07/15 0009 10/08/15 1714  AST 19 15  ALT 19 16  ALKPHOS 80 74  BILITOT 0.8 0.9  PROT 7.9 7.2  ALBUMIN 4.1 3.7    Recent Labs Lab 10/07/15 0009 10/08/15 1714  LIPASE 27 20   No results for input(s): AMMONIA in the last 168 hours. CBC:  Recent Labs Lab 10/07/15 0009 10/08/15 1714 10/09/15 0407  WBC 9.6 13.1* 9.0  NEUTROABS  --  12.0*  --   HGB 13.2 13.0 11.4*  HCT 40.3 41.2 35.3*  MCV 92.9 93.8 93.4  PLT 281 233 212   Cardiac Enzymes: No results for input(s): CKTOTAL, CKMB,  CKMBINDEX, TROPONINI in the last 168 hours. BNP: Invalid input(s): POCBNP CBG: No results for input(s): GLUCAP in the last 168 hours.  Recent Results (from the past 240 hour(s))  Urine culture     Status: None   Collection Time: 10/08/15  6:42 PM  Result Value Ref Range Status   Specimen Description URINE, CLEAN CATCH  Final   Special Requests NONE  Final   Culture   Final    MULTIPLE SPECIES PRESENT, SUGGEST RECOLLECTION Performed at [facility]    Report Status 10/10/2015 FINAL  Final     Scheduled Meds: . antiseptic oral rinse  15 mL Mouth Rinse TID  . atorvastatin  10 mg Oral Daily  . budesonide-formoterol  2 puff Inhalation BID  . docusate sodium  100 mg Oral BID  . enoxaparin (LOVENOX) injection  40 mg Subcutaneous Q24H  . famotidine  20 mg Oral Daily  . polyethylene glycol  17 g Oral BID  . senna  2 tablet Oral Daily  . tiotropium  18 mcg Inhalation Daily  . vitamin B-12  100 mcg Oral Daily   Continuous Infusions:    Vanity Larsson, DO  [facility] Pager [phone]  If 7PM-7AM, please contact night-coverage [email or web address] Password [facility] 10/10/2015, 12:57 PM

## 2015-10-10 NOTE — Care Management Obs Status (Addendum)
[facility] NOTIFICATION   Patient Details  Name: Lynn Ramirez MRN: [id number] Date of Birth: [birth year]   Medicare Observation Status Notification Given:  Yes MOON given to and signed by pt; copy to [facility] for scanning into medical records.   Erenest Rasher, RN 10/10/2015, 11:30 AM

## 2015-10-10 NOTE — Progress Notes (Signed)
Pt had large BM x 2 after drinking Mag Citrate. Will continue to monitor.

## 2015-10-10 NOTE — Progress Notes (Signed)
  Subjective: She states she feels a little more bloated than yesterday. No bm since yesterday morning although she continues to pass flatus  Objective: Vital signs in last 24 hours: Temp:  [97.9 F (36.6 C)-98.4 F (36.9 C)] 98 F (36.7 C) (04/02 0515) Pulse Rate:  [69-85] 69 (04/02 0749) Resp:  [16-20] 18 (04/02 0749) BP: (123-139)/(52-78) 126/78 mmHg (04/02 0515) SpO2:  [92 %-96 %] 94 % (04/02 0749) Last BM Date: 10/09/15  Intake/Output from previous day: 04/01 0701 - 04/02 0700 In: 480 [P.O.:480] Out: -  Intake/Output this shift:    Resp: clear to auscultation bilaterally Cardio: regular rate and rhythm GI: mild right sided tenderness but otherwise very soft with no appreciable distension  Lab Results:   Recent Labs  10-20-2015 1714 10/09/15 0407  WBC 13.1* 9.0  HGB 13.0 11.4*  HCT 41.2 35.3*  PLT 233 212   BMET  Recent Labs  10/09/15 0407 10/10/15 0349  NA 138 141  K 3.6 4.3  CL 102 102  CO2 28 30  GLUCOSE 100* 114*  BUN 20 15  CREATININE 0.93 0.92  CALCIUM 8.7* 9.5   PT/INR No results for input(s): LABPROT, INR in the last 72 hours. ABG No results for input(s): PHART, HCO3 in the last 72 hours.  Invalid input(s): PCO2, PO2  Studies/Results: Dg Abd 2 Views  October 20, 2015  CLINICAL DATA:  Abdominal pain with peritoneal carcinomatosis. Ovarian carcinoma EXAM: ABDOMEN - 2 VIEW COMPARISON:  CT abdomen and pelvis October 07, 2015 FINDINGS: There is fluid throughout the colon. There is no bowel dilatation or air-fluid level suggesting obstruction. No free air. Lung bases are clear. There are small probable phleboliths in the pelvis. There are surgical clips in the upper right abdomen. IMPRESSION: Fluid throughout the colon. This finding may be normal but also may be indicative of early colonic ileus. No bowel obstruction evident. No free air. Electronically Signed   By: Lowella Grip III M.D.   On: 10/20/15 15:00    Anti-infectives: Anti-infectives    None      Assessment/Plan: s/p * No surgery found * Stay at full liquids for now since she feels bloated  Check abd xrays today Start miralax and monitor     TOTH III,PAUL S 10/10/2015

## 2015-10-11 DIAGNOSIS — R1084 Generalized abdominal pain: Secondary | ICD-10-CM | POA: Diagnosis not present

## 2015-10-11 DIAGNOSIS — C482 Malignant neoplasm of peritoneum, unspecified: Secondary | ICD-10-CM | POA: Diagnosis not present

## 2015-10-11 DIAGNOSIS — R112 Nausea with vomiting, unspecified: Secondary | ICD-10-CM | POA: Diagnosis not present

## 2015-10-11 DIAGNOSIS — C569 Malignant neoplasm of unspecified ovary: Secondary | ICD-10-CM | POA: Diagnosis not present

## 2015-10-11 DIAGNOSIS — K5669 Other intestinal obstruction: Secondary | ICD-10-CM | POA: Diagnosis not present

## 2015-10-11 DIAGNOSIS — K5900 Constipation, unspecified: Secondary | ICD-10-CM | POA: Diagnosis not present

## 2015-10-11 LAB — BASIC METABOLIC PANEL
Anion gap: 7 (ref 5–15)
BUN: 11 mg/dL (ref 6–20)
CO2: 31 mmol/L (ref 22–32)
CREATININE: 0.86 mg/dL (ref 0.44–1.00)
Calcium: 9 mg/dL (ref 8.9–10.3)
Chloride: 105 mmol/L (ref 101–111)
GFR calc Af Amer: >60 mL/min (ref >60)
GLUCOSE: 113 mg/dL — AB (ref 65–99)
POTASSIUM: 3.7 mmol/L (ref 3.5–5.1)
Sodium: 143 mmol/L (ref 135–145)

## 2015-10-11 MED ORDER — DOCUSATE SODIUM 100 MG PO CAPS: 100.0000 mg | ORAL_CAPSULE | Freq: Two times a day (BID) | ORAL | Status: DC

## 2015-10-11 MED ORDER — POLYETHYLENE GLYCOL 3350 17 G PO PACK: 17.0000 g | PACK | Freq: Every day | ORAL | Status: DC

## 2015-10-11 MED ORDER — SENNA 8.6 MG PO TABS: 2.0000 | ORAL_TABLET | Freq: Every day | ORAL | Status: DC

## 2015-10-11 NOTE — Progress Notes (Signed)
Subjective: Tolerating full liquids.  Having multiple loose BMs after citrate of magnesium.  No nausea.  "Funny feeling" in LLQ. Saw Dr. Benay Spice this morning.  Objective: Vital signs in last 24 hours: Temp:  [97.2 F (36.2 C)-98.3 F (36.8 C)] 98.1 F (36.7 C) (04/03 0522) Pulse Rate:  [68-75] 74 (04/03 0522) Resp:  [16-17] 16 (04/03 0522) BP: (123-141)/(56-61) 128/61 mmHg (04/03 0522) SpO2:  [92 %-97 %] 92 % (04/03 0522) Last BM Date: 2015/11/01  Intake/Output from previous day: 11/01/22 0701 - 04/03 0700 In: 480 [P.O.:480] Out: -  Intake/Output this shift:    PE: General- In NAD Abdomen-soft, mild RLQ tenderness  Lab Results:   Recent Labs  10/08/15 1714 10/09/15 0407  WBC 13.1* 9.0  HGB 13.0 11.4*  HCT 41.2 35.3*  PLT 233 212   BMET  Recent Labs  11-01-2015 0349 10/11/15 0348  NA 141 143  K 4.3 3.7  CL 102 105  CO2 30 31  GLUCOSE 114* 113*  BUN 15 11  CREATININE 0.92 0.86  CALCIUM 9.5 9.0   PT/INR No results for input(s): LABPROT, INR in the last 72 hours. Comprehensive Metabolic Panel:    Component Value Date/Time   NA 143 10/11/2015 0348   NA 141 11-01-2015 0349   NA 139 09/07/2015 1149   NA 139 08/31/2015 1057   K 3.7 10/11/2015 0348   K 4.3 01-Nov-2015 0349   K 4.3 09/07/2015 1149   K 4.0 08/31/2015 1057   CL 105 10/11/2015 0348   CL 102 Nov 01, 2015 0349   CO2 31 10/11/2015 0348   CO2 30 November 01, 2015 0349   CO2 33* 09/07/2015 1149   CO2 29 08/31/2015 1057   BUN 11 10/11/2015 0348   BUN 15 11/01/2015 0349   BUN 24.9 09/07/2015 1149   BUN 24.5 08/31/2015 1057   CREATININE 0.86 10/11/2015 0348   CREATININE 0.92 01-Nov-2015 0349   CREATININE 1.2* 09/07/2015 1149   CREATININE 0.9 08/31/2015 1057   CREATININE 0.91 04/30/2015 1058   GLUCOSE 113* 10/11/2015 0348   GLUCOSE 114* Nov 01, 2015 0349   GLUCOSE 99 09/07/2015 1149   GLUCOSE 99 08/31/2015 1057   CALCIUM 9.0 10/11/2015 0348   CALCIUM 9.5 11-01-15 0349   CALCIUM 9.8 09/07/2015 1149    CALCIUM 11.0* 08/31/2015 1057   AST 15 10/08/2015 1714   AST 19 10/07/2015 0009   AST 14 09/07/2015 1149   AST 13 08/31/2015 1057   ALT 16 10/08/2015 1714   ALT 19 10/07/2015 0009   ALT 15 09/07/2015 1149   ALT 16 08/31/2015 1057   ALKPHOS 74 10/08/2015 1714   ALKPHOS 80 10/07/2015 0009   ALKPHOS 90 09/07/2015 1149   ALKPHOS 101 08/31/2015 1057   BILITOT 0.9 10/08/2015 1714   BILITOT 0.8 10/07/2015 0009   BILITOT 0.45 09/07/2015 1149   BILITOT 0.42 08/31/2015 1057   PROT 7.2 10/08/2015 1714   PROT 7.9 10/07/2015 0009   PROT 7.1 09/07/2015 1149   PROT 7.5 08/31/2015 1057   ALBUMIN 3.7 10/08/2015 1714   ALBUMIN 4.1 10/07/2015 0009   ALBUMIN 3.6 09/07/2015 1149   ALBUMIN 3.7 08/31/2015 1057     Studies/Results: Dg Abd 2 Views  11-01-15  CLINICAL DATA:  Abdominal pain.  Follow-up small bowel obstruction. EXAM: ABDOMEN - 2 VIEW COMPARISON:  10/08/2015 FINDINGS: No dilated bowel loops are identified. The bowel gas pattern is unremarkable. No suspicious calcifications or pneumoperitoneum noted. Cholecystectomy clips are present. IMPRESSION: Unremarkable bowel gas pattern. Electronically Signed   By: Dellis Filbert  Hu M.D.   On: 10/10/2015 10:01    Anti-infectives: Anti-infectives    None      Assessment Active Problems:   Metastatic ovarian cancer peritoneal carcinomatosis (HCC) and partial bowel obstruction-improved      Plan: Soft diet.  Okay for discharge today from our standpoint and starting chemotherapy on 10/13/15   Jenet Durio J 10/11/2015

## 2015-10-11 NOTE — Progress Notes (Signed)
Patient education reviewed with patient.  Patient verbalized understanding.  Transported via wheelchair and released with husband in private vehicle.

## 2015-10-11 NOTE — Discharge Summary (Signed)
Physician Discharge Summary  Lynn Ramirez [id number] DOB: Dec 07, 1940 DOA: 10/08/2015  PCP: Antony Blackbird, MD  Admit date: 10/08/2015 Discharge date: 10/11/2015  Recommendations for Outpatient Follow-up:  1. Pt will need to follow up with PCP in 2 weeks post discharge 2. Please obtain BMP and CBC in 1-2 weeks  Discharge Diagnoses:    Assessment/Plan: Abdominal pain/constipation/obstipation -likely due to constipation, possible obstipation which is likely due to adhesions and peritoneal carcinomatosis -4 BMs with SMOG enema--abd pain somewhat improved -repeat SMOG--only small BM -10/07/2015 CT abdomen and pelvis with a large amount of stool and possible narrowing of the sigmoid colon -10/09/15-appreciate Dr. Havery Moros -Gen. Surgery consult appreciated -10/10/15-2 view abdominal x-ray negative for obstruction, but still abundant stool -IV fluids -Judicious opioids for pain relief -TSH--0.829 -start miralax -give mag citrate-->good effect with multiple stools-->improved abdominal pain and distension -home with daily bowel regimen Ovarian serous carcinoma -follows Dr. Benay Spice -there is clinical evidence of disease progression per Dr. Benay Spice -last chemotherapy 03/10/2015 Nausea and vomiting -LFTs and lipase unremarkable -Symptomatically treatment -10/10/15-2 view abdominal x-ray negative for obstruction, but still abundant stool -remain on full liquids -improved with BMs COPD -Stable without exacerbation -Continue Symbicort and Spiriva Hypertension -Elevated BP Partly due to pain -Hold Maxzide for now Leukocytosis -Afebrile and hemodynamically stable -May be stress demargination -Urinalysis negative for pyuria -improved Anxiety -ativan 0.5mg  q 6hr prn--pt refuses  Discharge Condition: stable  Disposition: home  Diet:soft Wt Readings from Last 3 Encounters:  10/08/15 82.101 kg (181 lb)  10/08/15 82.237 kg (181 lb 4.8 oz)  10/07/15 83.689 kg (184 lb 8 oz)     History of present illness:  Brief history 75 year old female with a history of ovarian adenocarcinoma followed by Dr. Benay Spice, COPD, hyperlipidemia hypertension presents with 2 day history of worsening abdominal pain. The patientnormally struggles with some constipation, but she states that she had a bowel movement on 10/05/2015 morning. On 10/06/2015, the patient began developing abdominal pain that continued to worsen. She went to emergency department on the morning of 10/07/2015. CT of the abdomen and pelvis at that time revealed diffuse omental nodularity. There was also a focal area of persistent narrowing of the sigmoid colon concerning for possible stricture. There was a large amount of stool without any small bowel obstruction. There were multiple loops of small bowel abutting the peritoneal wall consistent with adhesions. The patient was given opioids with symptomatic relief. She was discharged home in stable condition from the emergency department. Unfortunately, the patient continued to have abdominal pain. She used a fleets enema which only resulted in a small amount of stool, and she had persistent abdominal pain. She went to the cancer center on3/31/17. Because of concerns for possible obstruction, the patient was sent back to the emergency department for further evaluation. In the emergency department, 2 view abdominal x-ray Was negative for any bowel dilatation or air-fluid collections to suggest obstruction. WBC was 13.1.  Consultants: Med Onc--[facility]rill Gen surgery [facility]  Discharge Exam: Filed Vitals:   10/11/15 0522 10/11/15 1305  BP: 128/61 124/48  Pulse: 74 78  Temp: 98.1 F (36.7 C) 97.8 F (36.6 C)  Resp: 16 16   Filed Vitals:   10/10/15 2112 10/11/15 0522 10/11/15 0905 10/11/15 1305  BP: 141/56 128/61  124/48  Pulse: 75 74  78  Temp: 98.3 F (36.8 C) 98.1 F (36.7 C)  97.8 F (36.6 C)  TempSrc: Oral Oral  Oral  Resp: 16 16  16   Height:  Weight:      SpO2: 93% 92% 96% 95%   General: A&O x 3, NAD, pleasant, cooperative Cardiovascular: RRR, no rub, no gallop, no S3 Respiratory: CTAB, no wheeze, no rhonchi Abdomen:soft, nontender, nondistended, positive bowel sounds Extremities: No edema, No lymphangitis, no petechiae  Discharge Instructions  Discharge Instructions    Diet - low sodium heart healthy    Complete by:  As directed      Increase activity slowly    Complete by:  As directed             Medication List    TAKE these medications        antiseptic oral rinse Liqd  15 mLs by Mouth Rinse route 3 (three) times daily.     atorvastatin 10 MG tablet  Commonly known as:  LIPITOR  Take 10 mg by mouth daily.     Biotin 2500 MCG Caps  Take 1 tablet by mouth daily.     budesonide-formoterol 160-4.5 MCG/ACT inhaler  Commonly known as:  SYMBICORT  Inhale 2 puffs into the lungs 2 (two) times daily.     dexamethasone 4 MG tablet  Commonly known as:  DECADRON  Take 10 mg (2.5 tablets) at 10:00PM night before chemotherapy and repeat at 6:00AM the morning of chemotherapy     docusate sodium 100 MG capsule  Commonly known as:  COLACE  Take 1 capsule (100 mg total) by mouth 2 (two) times daily.     famotidine 10 MG tablet  Commonly known as:  PEPCID  Take 20 mg by mouth daily.     fluocinonide cream 0.05 %  Commonly known as:  LIDEX  Apply 1 application topically 2 (two) times daily as needed (For ezcema).     lidocaine-prilocaine cream  Commonly known as:  EMLA  Apply to port site one hour prior to use. Do not rub in. Cover with plastic.     OMEGA-3 PO  Take 1 capsule by mouth daily.     ondansetron 4 MG disintegrating tablet  Commonly known as:  ZOFRAN-ODT  Take 1 tablet (4 mg total) by mouth daily as needed for nausea or vomiting.     oxyCODONE 5 MG immediate release tablet  Commonly known as:  Oxy IR/ROXICODONE  Take 1 tablet by mouth every 4 (four) hours as needed for severe pain.      polyethylene glycol packet  Commonly known as:  MIRALAX / GLYCOLAX  Take 17 g by mouth daily.     PROAIR HFA 108 (90 Base) MCG/ACT inhaler  Generic drug:  albuterol  INHALE 2 PUFFS BY MOUTH FOUR TIMES DAILY AS NEEDED FOR WHEEZING     senna 8.6 MG Tabs tablet  Commonly known as:  SENOKOT  Take 2 tablets (17.2 mg total) by mouth daily.     Tiotropium Bromide Monohydrate 2.5 MCG/ACT Aers  Commonly known as:  SPIRIVA RESPIMAT  2 pffs each am     triamterene-hydrochlorothiazide 37.5-25 MG tablet  Commonly known as:  MAXZIDE-25  Take 0.5 tablets by mouth daily.     TYLENOL 325 MG Caps  Generic drug:  Acetaminophen  Take 1 tablet by mouth 3 (three) times daily as needed (pain).     vitamin B-12 100 MCG tablet  Commonly known as:  CYANOCOBALAMIN  Take 100 mcg by mouth daily.     B-12 IJ  Inject 1 each as directed every 30 (thirty) days. Reported on 10/07/2015         The results  of significant diagnostics from this hospitalization (including imaging, microbiology, ancillary and laboratory) are listed below for reference.    Significant Diagnostic Studies: Ct Abdomen Pelvis W Contrast  10/07/2015  CLINICAL DATA:  75 year old female with ovarian cancer presenting with worsening lower abdominal pain. EXAM: CT ABDOMEN AND PELVIS WITH CONTRAST TECHNIQUE: Multidetector CT imaging of the abdomen and pelvis was performed using the standard protocol following bolus administration of intravenous contrast. CONTRAST:  172mL ISOVUE-300 IOPAMIDOL (ISOVUE-300) INJECTION 61% COMPARISON:  CT dated 08/07/2015 FINDINGS: The visualized lung bases are clear. Mild right pleural based thickening/scarring. No intra-abdominal free air or free fluid. Cholecystectomy. Small amount of fluid is noted in the subhepatic area adjacent to the cholecystectomy clips. This may represent a small fluid collection with possible superimposed infection or leakage of bile. Mild fatty infiltration of the liver. There is mild  intrahepatic biliary ductal dilatation, possibly post cholecystectomy. The pancreas, spleen, and adrenal glands appear unremarkable. There is a solitary right kidney. An extrarenal pelvis noted on the right. There is mild right hydronephrosis similar to prior study. No calcified stone identified. A focal area of stricture at the right UPJ is not excluded. The right ureter and urinary bladder appear unremarkable. There is hypo enhancement of the right renal parenchyma possibly related to hydronephrosis. Correlation with urinalysis recommended to exclude UTI. Hysterectomy. There is postsurgical changes and scarring within the pelvis. There is a 1 cm calcific density in the right hemipelvis which may be dystrophic calcification and related to prior inflammation or a diverticulith. There is a focal area of persistent narrowing in the sigmoid colon adjacent to this calcification likely related to underlying adhesion or stricture. Large amount of stool noted throughout the colon proximal to this point suggestive of a degree of luminal narrowing and stricture. There is sigmoid diverticulosis and scattered colonic diverticula without active inflammatory changes. There is no evidence of small-bowel obstruction. There is abutment of multiple loops of small bowel to the anterior peritoneal wall compatible with adhesions. The appendix is not visualized and may be surgically absent. There is mild diffuse nodularity of the omentum predominantly in the left upper abdomen (series 2, image 26). Early omental implants are not excluded. There is diastases of anterior abdominal wall musculature with a broad-based shallow hernia with abutment of short segment of transverse colon and distal stomach to the anterior peritoneal wall. There is aortoiliac atherosclerotic disease. No portal venous gas identified. There is no adenopathy. There is degenerative changes of the spine. No acute fracture. IMPRESSION: Cholecystectomy with small amount  of fluid adjacent to the cholecystectomy bed may represent an infected fluid or bile leak. Clinical correlation is recommended. A hepatobiliary scintigraphy may provide additional information if a bowel leak is clinically suspected. Postsurgical changes of hysterectomy with scarring in the pelvis. Focal area of persistent narrowing of the sigmoid colon in this region is concerning for stricture possibly related to adhesion with associated mild obstruction. Large amount of stool noted proximal to point. No evidence of small-bowel obstruction. Colonic diverticulosis without active inflammation. Stable appearing mild right hydronephrosis. Underlying UPJ stricture is not excluded. Electronically Signed   By: Anner Crete M.D.   On: 10/07/2015 02:50   Dg Abd 2 Views  10/10/2015  CLINICAL DATA:  Abdominal pain.  Follow-up small bowel obstruction. EXAM: ABDOMEN - 2 VIEW COMPARISON:  10/08/2015 FINDINGS: No dilated bowel loops are identified. The bowel gas pattern is unremarkable. No suspicious calcifications or pneumoperitoneum noted. Cholecystectomy clips are present. IMPRESSION: Unremarkable bowel gas pattern. Electronically Signed  By: Margarette Canada M.D.   On: 10/10/2015 10:01   Dg Abd 2 Views  10/08/2015  CLINICAL DATA:  Abdominal pain with peritoneal carcinomatosis. Ovarian carcinoma EXAM: ABDOMEN - 2 VIEW COMPARISON:  CT abdomen and pelvis October 07, 2015 FINDINGS: There is fluid throughout the colon. There is no bowel dilatation or air-fluid level suggesting obstruction. No free air. Lung bases are clear. There are small probable phleboliths in the pelvis. There are surgical clips in the upper right abdomen. IMPRESSION: Fluid throughout the colon. This finding may be normal but also may be indicative of early colonic ileus. No bowel obstruction evident. No free air. Electronically Signed   By: Lowella Grip III M.D.   On: 10/08/2015 15:00     Microbiology: Recent Results (from the past 240 hour(s))   Urine culture     Status: None   Collection Time: 10/08/15  6:42 PM  Result Value Ref Range Status   Specimen Description URINE, CLEAN CATCH  Final   Special Requests NONE  Final   Culture   Final    MULTIPLE SPECIES PRESENT, SUGGEST RECOLLECTION Performed at [facility]    Report Status 10/10/2015 FINAL  Final     Labs: Basic Metabolic Panel:  Recent Labs Lab 10/07/15 0009 10/08/15 1714 10/09/15 0407 10/10/15 0349 10/11/15 0348  NA 138 133* 138 141 143  K 3.7 3.6 3.6 4.3 3.7  CL 100* 95* 102 102 105  CO2 28 28 28 30 31   GLUCOSE 150* 141* 100* 114* 113*  BUN 28* 21* 20 15 11   CREATININE 1.07* 1.10* 0.93 0.92 0.86  CALCIUM 10.1 9.4 8.7* 9.5 9.0  MG  --   --   --  2.1  --    Liver Function Tests:  Recent Labs Lab 10/07/15 0009 10/08/15 1714  AST 19 15  ALT 19 16  ALKPHOS 80 74  BILITOT 0.8 0.9  PROT 7.9 7.2  ALBUMIN 4.1 3.7    Recent Labs Lab 10/07/15 0009 10/08/15 1714  LIPASE 27 20   No results for input(s): AMMONIA in the last 168 hours. CBC:  Recent Labs Lab 10/07/15 0009 10/08/15 1714 10/09/15 0407  WBC 9.6 13.1* 9.0  NEUTROABS  --  12.0*  --   HGB 13.2 13.0 11.4*  HCT 40.3 41.2 35.3*  MCV 92.9 93.8 93.4  PLT 281 233 212   Cardiac Enzymes: No results for input(s): CKTOTAL, CKMB, CKMBINDEX, TROPONINI in the last 168 hours. BNP: Invalid input(s): POCBNP CBG: No results for input(s): GLUCAP in the last 168 hours.  Time coordinating discharge:  Greater than 30 minutes  Signed:  Jalysa Swopes, DO [facility] Pager: [phone] 10/11/2015, 8:30 PM

## 2015-10-12 ENCOUNTER — Other Ambulatory Visit: Payer: Self-pay | Admitting: *Deleted

## 2015-10-12 ENCOUNTER — Other Ambulatory Visit: Payer: Self-pay | Admitting: Oncology

## 2015-10-12 NOTE — Progress Notes (Signed)
Pt scheduled for treatment 10/13/15. Per Dr. Benay Spice: OK to treat if pain and nausea is improved and pt's bowels are moving. If not, please review with MD. No labs needed for 4/5 treatment. Pt had CBC/CMET 3/31, with repeat BMET on 4/3.

## 2015-10-13 ENCOUNTER — Ambulatory Visit (HOSPITAL_BASED_OUTPATIENT_CLINIC_OR_DEPARTMENT_OTHER): Payer: Medicare Other

## 2015-10-13 ENCOUNTER — Other Ambulatory Visit: Payer: Medicare Other

## 2015-10-13 ENCOUNTER — Encounter: Payer: Medicare Other | Admitting: Nutrition

## 2015-10-13 ENCOUNTER — Ambulatory Visit: Payer: Medicare Other | Admitting: Nutrition

## 2015-10-13 VITALS — BP 140/55 | HR 71 | Temp 97.8°F | Resp 18

## 2015-10-13 DIAGNOSIS — Z5111 Encounter for antineoplastic chemotherapy: Secondary | ICD-10-CM

## 2015-10-13 DIAGNOSIS — C569 Malignant neoplasm of unspecified ovary: Secondary | ICD-10-CM | POA: Diagnosis not present

## 2015-10-13 DIAGNOSIS — C482 Malignant neoplasm of peritoneum, unspecified: Secondary | ICD-10-CM

## 2015-10-13 MED ORDER — SODIUM CHLORIDE 0.9 % IV SOLN
10.0000 mg | Freq: Once | INTRAVENOUS | Status: AC
  Administered 2015-10-13: 10 mg via INTRAVENOUS
  Filled 2015-10-13: qty 1

## 2015-10-13 MED ORDER — DIPHENHYDRAMINE HCL 50 MG/ML IJ SOLN
25.0000 mg | Freq: Once | INTRAMUSCULAR | Status: AC
  Administered 2015-10-13: 25 mg via INTRAVENOUS

## 2015-10-13 MED ORDER — FAMOTIDINE IN NACL 20-0.9 MG/50ML-% IV SOLN
20.0000 mg | Freq: Once | INTRAVENOUS | Status: AC
  Administered 2015-10-13: 20 mg via INTRAVENOUS

## 2015-10-13 MED ORDER — DIPHENHYDRAMINE HCL 50 MG/ML IJ SOLN
INTRAMUSCULAR | Status: AC
  Filled 2015-10-13: qty 1

## 2015-10-13 MED ORDER — FAMOTIDINE IN NACL 20-0.9 MG/50ML-% IV SOLN
INTRAVENOUS | Status: AC
  Filled 2015-10-13: qty 50

## 2015-10-13 MED ORDER — PACLITAXEL CHEMO INJECTION 300 MG/50ML
80.0000 mg/m2 | Freq: Once | INTRAVENOUS | Status: AC
  Administered 2015-10-13: 150 mg via INTRAVENOUS
  Filled 2015-10-13: qty 25

## 2015-10-13 MED ORDER — HEPARIN SOD (PORK) LOCK FLUSH 100 UNIT/ML IV SOLN
500.0000 [IU] | Freq: Once | INTRAVENOUS | Status: AC | PRN
  Administered 2015-10-13: 500 [IU]
  Filled 2015-10-13: qty 5

## 2015-10-13 MED ORDER — SODIUM CHLORIDE 0.9 % IV SOLN
Freq: Once | INTRAVENOUS | Status: AC
  Administered 2015-10-13: 09:00:00 via INTRAVENOUS

## 2015-10-13 MED ORDER — SODIUM CHLORIDE 0.9% FLUSH
10.0000 mL | INTRAVENOUS | Status: DC | PRN
  Administered 2015-10-13: 10 mL
  Filled 2015-10-13: qty 10

## 2015-10-13 NOTE — Progress Notes (Signed)
75 year old female diagnosed with ovarian cancer. She is a patient of Dr. Julieanne Manson.  Past medical history includes partial small bowel obstruction, COPD, hypertension, hyperlipidemia, hydronephrosis, emphysema, and GERD.  Medications include Biotene, Lipitor, vitamin B12, Decadron, Colace, Pepcid, omega-3 fatty acids, Zofran, and MiraLAX.  Labs were reviewed.  Height: 61 inches Weight: 181 pounds March 31. BMI: 34.22  Patient contacted me to receive diet education on a low residue diet to minimize bowel obstructions. Patient was told by physician to follow a low roughage diet. Noted history of partial small bowel obstruction and admission to the hospital.  Nutrition diagnosis: Food and nutrition related knowledge deficit related to ovarian cancer as evidenced by partial small bowel obstruction.  Intervention: I educated patient on low fiber diet and reviewed importance of eliminating high fiber foods. Reviewed fact sheet with patient and provided her with a copy Questions were answered.  Teach back method used.  Monitoring, evaluation, goals: Patient will tolerate low fiber diet to minimize risk for bowel obstruction.  Nutrition diagnosis resolved.  No follow-up has been scheduled.

## 2015-10-13 NOTE — Patient Instructions (Signed)
[facility] Discharge Instructions for Patients Receiving Chemotherapy  Today you received the following chemotherapy agents: Taxol To help prevent nausea and vomiting after your treatment, we encourage you to take your nausea medication as directed.    If you develop nausea and vomiting that is not controlled by your nausea medication, call the clinic.   BELOW ARE SYMPTOMS THAT SHOULD BE REPORTED IMMEDIATELY:  *FEVER GREATER THAN 100.5 F  *CHILLS WITH OR WITHOUT FEVER  NAUSEA AND VOMITING THAT IS NOT CONTROLLED WITH YOUR NAUSEA MEDICATION  *UNUSUAL SHORTNESS OF BREATH  *UNUSUAL BRUISING OR BLEEDING  TENDERNESS IN MOUTH AND THROAT WITH OR WITHOUT PRESENCE OF ULCERS  *URINARY PROBLEMS  *BOWEL PROBLEMS  UNUSUAL RASH Items with * indicate a potential emergency and should be followed up as soon as possible.  Feel free to call the clinic you have any questions or concerns. The clinic phone number is [phone].  Please show the CHEMO ALERT CARD at check-in to the Emergency Department and triage nurse.  Paclitaxel injection What is this medicine? PACLITAXEL (PAK li TAX el) is a chemotherapy drug. It targets fast dividing cells, like cancer cells, and causes these cells to die. This medicine is used to treat ovarian cancer, breast cancer, and other cancers. This medicine may be used for other purposes; ask your health care provider or pharmacist if you have questions. What should I tell my health care provider before I take this medicine? They need to know if you have any of these conditions: -blood disorders -irregular heartbeat -infection (especially a virus infection such as chickenpox, cold sores, or herpes) -liver disease -previous or ongoing radiation therapy -an unusual or allergic reaction to paclitaxel, alcohol, polyoxyethylated castor oil, other chemotherapy agents, other medicines, foods, dyes, or preservatives -pregnant or trying to get  pregnant -breast-feeding How should I use this medicine? This drug is given as an infusion into a vein. It is administered in a hospital or clinic by a specially trained health care professional. Talk to your pediatrician regarding the use of this medicine in children. Special care may be needed. Overdosage: If you think you have taken too much of this medicine contact a poison control center or emergency room at once. NOTE: This medicine is only for you. Do not share this medicine with others. What if I miss a dose? It is important not to miss your dose. Call your doctor or health care professional if you are unable to keep an appointment. What may interact with this medicine? Do not take this medicine with any of the following medications: -disulfiram -metronidazole This medicine may also interact with the following medications: -cyclosporine -diazepam -ketoconazole -medicines to increase blood counts like filgrastim, pegfilgrastim, sargramostim -other chemotherapy drugs like cisplatin, doxorubicin, epirubicin, etoposide, teniposide, vincristine -quinidine -testosterone -vaccines -verapamil Talk to your doctor or health care professional before taking any of these medicines: -acetaminophen -aspirin -ibuprofen -ketoprofen -naproxen This list may not describe all possible interactions. Give your health care provider a list of all the medicines, herbs, non-prescription drugs, or dietary supplements you use. Also tell them if you smoke, drink alcohol, or use illegal drugs. Some items may interact with your medicine. What should I watch for while using this medicine? Your condition will be monitored carefully while you are receiving this medicine. You will need important blood work done while you are taking this medicine. This drug may make you feel generally unwell. This is not uncommon, as chemotherapy can affect healthy cells as well as cancer   cells. Report any side effects. Continue  your course of treatment even though you feel ill unless your doctor tells you to stop. This medicine can cause serious allergic reactions. To reduce your risk you will need to take other medicine(s) before treatment with this medicine. In some cases, you may be given additional medicines to help with side effects. Follow all directions for their use. Call your doctor or health care professional for advice if you get a fever, chills or sore throat, or other symptoms of a cold or flu. Do not treat yourself. This drug decreases your body's ability to fight infections. Try to avoid being around people who are sick. This medicine may increase your risk to bruise or bleed. Call your doctor or health care professional if you notice any unusual bleeding. Be careful brushing and flossing your teeth or using a toothpick because you may get an infection or bleed more easily. If you have any dental work done, tell your dentist you are receiving this medicine. Avoid taking products that contain aspirin, acetaminophen, ibuprofen, naproxen, or ketoprofen unless instructed by your doctor. These medicines may hide a fever. Do not become pregnant while taking this medicine. Women should inform their doctor if they wish to become pregnant or think they might be pregnant. There is a potential for serious side effects to an unborn child. Talk to your health care professional or pharmacist for more information. Do not breast-feed an infant while taking this medicine. Men are advised not to father a child while receiving this medicine. This product may contain alcohol. Ask your pharmacist or healthcare provider if this medicine contains alcohol. Be sure to tell all healthcare providers you are taking this medicine. Certain medicines, like metronidazole and disulfiram, can cause an unpleasant reaction when taken with alcohol. The reaction includes flushing, headache, nausea, vomiting, sweating, and increased thirst. The reaction  can last from 30 minutes to several hours. What side effects may I notice from receiving this medicine? Side effects that you should report to your doctor or health care professional as soon as possible: -allergic reactions like skin rash, itching or hives, swelling of the face, lips, or tongue -low blood counts - This drug may decrease the number of white blood cells, red blood cells and platelets. You may be at increased risk for infections and bleeding. -signs of infection - fever or chills, cough, sore throat, pain or difficulty passing urine -signs of decreased platelets or bleeding - bruising, pinpoint red spots on the skin, black, tarry stools, nosebleeds -signs of decreased red blood cells - unusually weak or tired, fainting spells, lightheadedness -breathing problems -chest pain -high or low blood pressure -mouth sores -nausea and vomiting -pain, swelling, redness or irritation at the injection site -pain, tingling, numbness in the hands or feet -slow or irregular heartbeat -swelling of the ankle, feet, hands Side effects that usually do not require medical attention (report to your doctor or health care professional if they continue or are bothersome): -bone pain -complete hair loss including hair on your head, underarms, pubic hair, eyebrows, and eyelashes -changes in the color of fingernails -diarrhea -loosening of the fingernails -loss of appetite -muscle or joint pain -red flush to skin -sweating This list may not describe all possible side effects. Call your doctor for medical advice about side effects. You may report side effects to FDA at 1-800-FDA-1088. Where should I keep my medicine? This drug is given in a hospital or clinic and will not be stored at home. NOTE:   This sheet is a summary. It may not cover all possible information. If you have questions about this medicine, talk to your doctor, pharmacist, or health care provider.    2016, [employer].  (2015-02-11 13:02:56)  

## 2015-10-17 ENCOUNTER — Other Ambulatory Visit: Payer: Self-pay | Admitting: Oncology

## 2015-10-20 ENCOUNTER — Telehealth: Payer: Self-pay | Admitting: *Deleted

## 2015-10-20 ENCOUNTER — Ambulatory Visit (HOSPITAL_BASED_OUTPATIENT_CLINIC_OR_DEPARTMENT_OTHER): Payer: Medicare Other | Admitting: Oncology

## 2015-10-20 ENCOUNTER — Telehealth: Payer: Self-pay | Admitting: Nurse Practitioner

## 2015-10-20 ENCOUNTER — Ambulatory Visit (HOSPITAL_BASED_OUTPATIENT_CLINIC_OR_DEPARTMENT_OTHER): Payer: Medicare Other

## 2015-10-20 ENCOUNTER — Ambulatory Visit: Payer: Medicare Other

## 2015-10-20 ENCOUNTER — Other Ambulatory Visit (HOSPITAL_BASED_OUTPATIENT_CLINIC_OR_DEPARTMENT_OTHER): Payer: Medicare Other

## 2015-10-20 VITALS — BP 145/58 | HR 80 | Temp 98.3°F | Resp 18 | Ht 61.0 in | Wt 181.1 lb

## 2015-10-20 DIAGNOSIS — C799 Secondary malignant neoplasm of unspecified site: Secondary | ICD-10-CM

## 2015-10-20 DIAGNOSIS — G620 Drug-induced polyneuropathy: Secondary | ICD-10-CM | POA: Diagnosis not present

## 2015-10-20 DIAGNOSIS — C5701 Malignant neoplasm of right fallopian tube: Secondary | ICD-10-CM

## 2015-10-20 DIAGNOSIS — Z5111 Encounter for antineoplastic chemotherapy: Secondary | ICD-10-CM

## 2015-10-20 DIAGNOSIS — C7961 Secondary malignant neoplasm of right ovary: Secondary | ICD-10-CM

## 2015-10-20 DIAGNOSIS — C569 Malignant neoplasm of unspecified ovary: Secondary | ICD-10-CM

## 2015-10-20 DIAGNOSIS — Z95828 Presence of other vascular implants and grafts: Secondary | ICD-10-CM

## 2015-10-20 DIAGNOSIS — C7962 Secondary malignant neoplasm of left ovary: Secondary | ICD-10-CM

## 2015-10-20 DIAGNOSIS — D6959 Other secondary thrombocytopenia: Secondary | ICD-10-CM

## 2015-10-20 DIAGNOSIS — C482 Malignant neoplasm of peritoneum, unspecified: Secondary | ICD-10-CM

## 2015-10-20 LAB — CBC WITH DIFFERENTIAL/PLATELET
BASO%: 0.8 % (ref 0.0–2.0)
Basophils Absolute: 0 10*3/uL (ref 0.0–0.1)
EOS%: 2.1 % (ref 0.0–7.0)
Eosinophils Absolute: 0.1 10*3/uL (ref 0.0–0.5)
HCT: 35.2 % (ref 34.8–46.6)
HGB: 11.5 g/dL — ABNORMAL LOW (ref 11.6–15.9)
LYMPH%: 22.2 % (ref 14.0–49.7)
MCH: 30.1 pg (ref 25.1–34.0)
MCHC: 32.6 g/dL (ref 31.5–36.0)
MCV: 92.2 fL (ref 79.5–101.0)
MONO#: 0.2 10*3/uL (ref 0.1–0.9)
MONO%: 4.6 % (ref 0.0–14.0)
NEUT%: 70.3 % (ref 38.4–76.8)
NEUTROS ABS: 3.6 10*3/uL (ref 1.5–6.5)
Platelets: 232 10*3/uL (ref 145–400)
RBC: 3.82 10*6/uL (ref 3.70–5.45)
RDW: 16 % — AB (ref 11.2–14.5)
WBC: 5.2 10*3/uL (ref 3.9–10.3)
lymph#: 1.1 10*3/uL (ref 0.9–3.3)

## 2015-10-20 LAB — COMPREHENSIVE METABOLIC PANEL
ALT: 15 U/L (ref 0–55)
AST: 12 U/L (ref 5–34)
Albumin: 3.4 g/dL — ABNORMAL LOW (ref 3.5–5.0)
Alkaline Phosphatase: 69 U/L (ref 40–150)
Anion Gap: 7 mEq/L (ref 3–11)
BUN: 23.2 mg/dL (ref 7.0–26.0)
CO2: 31 meq/L — AB (ref 22–29)
Calcium: 9.6 mg/dL (ref 8.4–10.4)
Chloride: 100 mEq/L (ref 98–109)
Creatinine: 1 mg/dL (ref 0.6–1.1)
EGFR: 55 mL/min/{1.73_m2} — AB (ref >90)
GLUCOSE: 112 mg/dL (ref 70–140)
Potassium: 3.6 mEq/L (ref 3.5–5.1)
SODIUM: 138 meq/L (ref 136–145)
TOTAL PROTEIN: 6.7 g/dL (ref 6.4–8.3)

## 2015-10-20 MED ORDER — SODIUM CHLORIDE 0.9% FLUSH
10.0000 mL | INTRAVENOUS | Status: DC | PRN
  Administered 2015-10-20: 10 mL via INTRAVENOUS
  Filled 2015-10-20: qty 10

## 2015-10-20 MED ORDER — FAMOTIDINE IN NACL 20-0.9 MG/50ML-% IV SOLN
INTRAVENOUS | Status: AC
  Filled 2015-10-20: qty 50

## 2015-10-20 MED ORDER — DIPHENHYDRAMINE HCL 50 MG/ML IJ SOLN
25.0000 mg | Freq: Once | INTRAMUSCULAR | Status: AC
  Administered 2015-10-20: 25 mg via INTRAVENOUS

## 2015-10-20 MED ORDER — HEPARIN SOD (PORK) LOCK FLUSH 100 UNIT/ML IV SOLN
500.0000 [IU] | Freq: Once | INTRAVENOUS | Status: AC | PRN
  Administered 2015-10-20: 500 [IU]
  Filled 2015-10-20: qty 5

## 2015-10-20 MED ORDER — SODIUM CHLORIDE 0.9 % IV SOLN
Freq: Once | INTRAVENOUS | Status: AC
  Administered 2015-10-20: 10:00:00 via INTRAVENOUS

## 2015-10-20 MED ORDER — SODIUM CHLORIDE 0.9 % IV SOLN
10.0000 mg | Freq: Once | INTRAVENOUS | Status: AC
  Administered 2015-10-20: 10 mg via INTRAVENOUS
  Filled 2015-10-20: qty 1

## 2015-10-20 MED ORDER — DIPHENHYDRAMINE HCL 50 MG/ML IJ SOLN
INTRAMUSCULAR | Status: AC
  Filled 2015-10-20: qty 1

## 2015-10-20 MED ORDER — SODIUM CHLORIDE 0.9% FLUSH
10.0000 mL | INTRAVENOUS | Status: DC | PRN
  Administered 2015-10-20: 10 mL
  Filled 2015-10-20: qty 10

## 2015-10-20 MED ORDER — FAMOTIDINE IN NACL 20-0.9 MG/50ML-% IV SOLN
20.0000 mg | Freq: Once | INTRAVENOUS | Status: AC
  Administered 2015-10-20: 20 mg via INTRAVENOUS

## 2015-10-20 MED ORDER — SODIUM CHLORIDE 0.9 % IV SOLN
80.0000 mg/m2 | Freq: Once | INTRAVENOUS | Status: AC
  Administered 2015-10-20: 150 mg via INTRAVENOUS
  Filled 2015-10-20: qty 25

## 2015-10-20 NOTE — Telephone Encounter (Signed)
per pof to sch pt appt-sent MW emailto sch trmt-pt to getupdated copy b4 leaving** °

## 2015-10-20 NOTE — Progress Notes (Signed)
[place] OFFICE PROGRESS NOTE   Diagnosis: Ovarian cancer  INTERVAL HISTORY:   Ms. Fulwood returns as scheduled. She was admitted 10/08/2015 with constipation and abdominal pain, likely secondary to adhesions in peritoneal carcinomatosis. Her symptoms improved with bowel rest and laxatives. She denies recurrent constipation. She is following a low residual diet and continues a laxative regimen. She began salvage weekly Taxol chemotherapy on 10/13/2015. She reports tolerating the Taxol well. No symptoms of an allergic reaction. No change in baseline neuropathy symptoms.  Objective:  Vital signs in last 24 hours:  Blood pressure 145/58, pulse 80, temperature 98.3 F (36.8 C), temperature source Oral, resp. rate 18, height 5\' 1"  (1.549 m), weight 181 lb 1.6 oz (82.146 kg), SpO2 97 %.    HEENT: No thrush, the mouth is dry Resp: Lungs clear bilaterally Cardio: Regular rate and rhythm GI: No hepatomegaly, nondistended, no mass, nontender Vascular: No leg edema   Portacath/PICC-without erythema  Lab Results:  Lab Results  Component Value Date   WBC 5.2 10/20/2015   HGB 11.5* 10/20/2015   HCT 35.2 10/20/2015   MCV 92.2 10/20/2015   PLT 232 10/20/2015   NEUTROABS 3.6 10/20/2015     Medications: I have reviewed the patient's current medications.  Assessment/Plan: 1. 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, immunohistochemical profile consistent with a GYN primary, elevated CA 125  Staging CTs of the chest, abdomen, and pelvis on 10/06/2014 revealed a loculated right pleural effusion, ascites, and omental/mesenteric thickening  Cytology from peritoneal fluid 10/13/2014 revealed malignant cells consistent with metastatic adenocarcinoma  Biopsy of an omental mass on 11/02/2014 revealed invasive serous carcinoma with psammoma bodies  Cycle 1 Taxol/carboplatin 10/28/2014  Cycle 2  Taxol/carboplatin 11/18/2014  Cycle 3 Taxol/carboplatin 12/09/2014  CT scan 12/23/2014 with interval improvement in peritoneal carcinomatosis with near-complete resolution of ascites and decreased omental nodularity. Significant improvement in malignant right pleural effusion.  Status post robotic-assisted laparoscopic hysterectomy with bilateral salpingoophorectomy, omentectomy, radical tumor debulking 12/29/2014. Per Dr. Serita Grit office note 01/14/2015 cytoreduction was optimal with residual disease remaining only in the 1 mm implants on the small intestine. 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  Cycle 1 adjuvant Taxol/carboplatin 01/20/2015  Cycle 2 adjuvant Taxol/carboplatin 02/10/2015  Cycle 3 adjuvant Taxol/carboplatin 03/10/2015  CA 125 on 1013 2016-42  CTs of the chest, abdomen, and pelvis 05/31/2015 with no evidence of progressive ovarian cancer  CTs of the chest, abdomen, and pelvis 08/07/2015 and 08/08/2015-no evidence of progressive ovarian cancer  Peritoneal studding noted at the time of the cholecystectomy procedure 08/09/2015  Elevated CA 125 10/07/2015  CT 10/07/2015 with stricturing at the sigmoid colon, constipation, and omental nodularity  Initiation of salvage weekly Taxol chemotherapy 10/13/2015 2. COPD 3. Dyspnea secondary to COPD and the large right pleural effusion, status post therapeutic thoracentesis procedures 09/30/2014,10/09/2014, and 10/21/2014. Left thoracentesis 10/30/2014 4. Left nephrectomy as a child 5. Delayed nausea following cycle 1 Taxol/carboplatin, Aloxi/Emend added with cycle 2 with improvement. 6. Right lower extremity edema, right calf pain 12/09/2014.  Negative venous Doppler 12/09/2014. 7. Diffuse pruritus following cycle 1 adjuvant Taxol/carboplatin 01/20/2015, no rash, resolved with a steroid dose pack 8. Thrombocytopenia second to chemotherapy, the carboplatin was dose reduced with cycle 2 adjuvant Taxol/carboplatin 02/10/2015 9. Chemotherapy-induced peripheral neuropathy-improved 10. Admission  with acute cholecystitis 08/07/2015, status post a cholecystectomy 08/09/2015 11. Admission 10/08/2015 with abdominal pain/constipation, improved with bowel rest and laxatives     Disposition:  The constipation and abdominal pain have improved. She will continue a low residual diet and laxative regimen. Ms. Burker will continue weekly Taxol chemotherapy. She will return for chemotherapy on 10/27/2015. She is scheduled for an office visit and chemotherapy on 11/03/2015. We will check the CA 125 when she is here on 11/03/2015.  Betsy Coder, MD  10/20/2015  3:44 PM

## 2015-10-20 NOTE — Telephone Encounter (Signed)
Per staff message and POF I have scheduled appts. Advised scheduler of appts. JMW  

## 2015-10-20 NOTE — Patient Instructions (Signed)

## 2015-10-20 NOTE — Patient Instructions (Signed)
[facility] Discharge Instructions for Patients Receiving Chemotherapy  Today you received the following chemotherapy agents Taxol   To help prevent nausea and vomiting after your treatment, we encourage you to take your nausea medication as directed.   If you develop nausea and vomiting that is not controlled by your nausea medication, call the clinic.   BELOW ARE SYMPTOMS THAT SHOULD BE REPORTED IMMEDIATELY:  *FEVER GREATER THAN 100.5 F  *CHILLS WITH OR WITHOUT FEVER  NAUSEA AND VOMITING THAT IS NOT CONTROLLED WITH YOUR NAUSEA MEDICATION  *UNUSUAL SHORTNESS OF BREATH  *UNUSUAL BRUISING OR BLEEDING  TENDERNESS IN MOUTH AND THROAT WITH OR WITHOUT PRESENCE OF ULCERS  *URINARY PROBLEMS  *BOWEL PROBLEMS  UNUSUAL RASH Items with * indicate a potential emergency and should be followed up as soon as possible.  Feel free to call the clinic you have any questions or concerns. The clinic phone number is [phone].  Please show the CHEMO ALERT CARD at check-in to the Emergency Department and triage nurse.   

## 2015-10-24 ENCOUNTER — Other Ambulatory Visit: Payer: Self-pay | Admitting: Oncology

## 2015-10-26 ENCOUNTER — Telehealth: Payer: Self-pay | Admitting: Emergency Medicine

## 2015-10-26 MED ORDER — ALBUTEROL SULFATE HFA 108 (90 BASE) MCG/ACT IN AERS: INHALATION_SPRAY | RESPIRATORY_TRACT | Status: DC

## 2015-10-26 NOTE — Telephone Encounter (Signed)
[email or web address] for pt 

## 2015-10-26 NOTE — Telephone Encounter (Signed)
Spoke with patient-aware that I have sent her Proair HFA refill to confirmed pharmacy on file. Pt also reminded of upcoming appt with RB. Nothing more needed at this time.

## 2015-10-26 NOTE — Telephone Encounter (Signed)
[phone] calling back

## 2015-10-27 ENCOUNTER — Ambulatory Visit: Payer: Medicare Other | Admitting: Nurse Practitioner

## 2015-10-27 ENCOUNTER — Ambulatory Visit: Payer: Medicare Other

## 2015-10-27 ENCOUNTER — Other Ambulatory Visit (HOSPITAL_BASED_OUTPATIENT_CLINIC_OR_DEPARTMENT_OTHER): Payer: Medicare Other

## 2015-10-27 ENCOUNTER — Ambulatory Visit (HOSPITAL_BASED_OUTPATIENT_CLINIC_OR_DEPARTMENT_OTHER): Payer: Medicare Other

## 2015-10-27 ENCOUNTER — Other Ambulatory Visit: Payer: Medicare Other

## 2015-10-27 ENCOUNTER — Encounter: Payer: Self-pay | Admitting: *Deleted

## 2015-10-27 VITALS — BP 151/66 | HR 68 | Temp 97.0°F | Resp 16

## 2015-10-27 DIAGNOSIS — Z5111 Encounter for antineoplastic chemotherapy: Secondary | ICD-10-CM | POA: Diagnosis not present

## 2015-10-27 DIAGNOSIS — C5701 Malignant neoplasm of right fallopian tube: Secondary | ICD-10-CM

## 2015-10-27 DIAGNOSIS — Z95828 Presence of other vascular implants and grafts: Secondary | ICD-10-CM

## 2015-10-27 DIAGNOSIS — C482 Malignant neoplasm of peritoneum, unspecified: Secondary | ICD-10-CM

## 2015-10-27 DIAGNOSIS — C561 Malignant neoplasm of right ovary: Secondary | ICD-10-CM

## 2015-10-27 DIAGNOSIS — C569 Malignant neoplasm of unspecified ovary: Secondary | ICD-10-CM

## 2015-10-27 LAB — CBC WITH DIFFERENTIAL/PLATELET
BASO%: 0.4 % (ref 0.0–2.0)
BASOS ABS: 0 10*3/uL (ref 0.0–0.1)
EOS%: 1.5 % (ref 0.0–7.0)
Eosinophils Absolute: 0 10*3/uL (ref 0.0–0.5)
HEMATOCRIT: 35.1 % (ref 34.8–46.6)
HEMOGLOBIN: 11.2 g/dL — AB (ref 11.6–15.9)
LYMPH#: 1.1 10*3/uL (ref 0.9–3.3)
LYMPH%: 39 % (ref 14.0–49.7)
MCH: 29.9 pg (ref 25.1–34.0)
MCHC: 31.9 g/dL (ref 31.5–36.0)
MCV: 93.9 fL (ref 79.5–101.0)
MONO#: 0.2 10*3/uL (ref 0.1–0.9)
MONO%: 7 % (ref 0.0–14.0)
NEUT#: 1.4 10*3/uL — ABNORMAL LOW (ref 1.5–6.5)
NEUT%: 52.1 % (ref 38.4–76.8)
PLATELETS: 235 10*3/uL (ref 145–400)
RBC: 3.74 10*6/uL (ref 3.70–5.45)
RDW: 15.7 % — AB (ref 11.2–14.5)
WBC: 2.7 10*3/uL — ABNORMAL LOW (ref 3.9–10.3)

## 2015-10-27 MED ORDER — SODIUM CHLORIDE 0.9% FLUSH
10.0000 mL | INTRAVENOUS | Status: DC | PRN
  Administered 2015-10-27: 10 mL via INTRAVENOUS
  Filled 2015-10-27: qty 10

## 2015-10-27 MED ORDER — HEPARIN SOD (PORK) LOCK FLUSH 100 UNIT/ML IV SOLN
500.0000 [IU] | Freq: Once | INTRAVENOUS | Status: AC | PRN
  Administered 2015-10-27: 500 [IU]
  Filled 2015-10-27: qty 5

## 2015-10-27 MED ORDER — SODIUM CHLORIDE 0.9 % IV SOLN
Freq: Once | INTRAVENOUS | Status: AC
  Administered 2015-10-27: 12:00:00 via INTRAVENOUS

## 2015-10-27 MED ORDER — FAMOTIDINE IN NACL 20-0.9 MG/50ML-% IV SOLN
20.0000 mg | Freq: Once | INTRAVENOUS | Status: AC
  Administered 2015-10-27: 20 mg via INTRAVENOUS

## 2015-10-27 MED ORDER — DIPHENHYDRAMINE HCL 50 MG/ML IJ SOLN
INTRAMUSCULAR | Status: AC
  Filled 2015-10-27: qty 1

## 2015-10-27 MED ORDER — FAMOTIDINE IN NACL 20-0.9 MG/50ML-% IV SOLN
INTRAVENOUS | Status: AC
  Filled 2015-10-27: qty 50

## 2015-10-27 MED ORDER — SODIUM CHLORIDE 0.9% FLUSH
10.0000 mL | INTRAVENOUS | Status: DC | PRN
  Administered 2015-10-27: 10 mL
  Filled 2015-10-27: qty 10

## 2015-10-27 MED ORDER — SODIUM CHLORIDE 0.9 % IV SOLN
80.0000 mg/m2 | Freq: Once | INTRAVENOUS | Status: AC
  Administered 2015-10-27: 150 mg via INTRAVENOUS
  Filled 2015-10-27: qty 25

## 2015-10-27 MED ORDER — SODIUM CHLORIDE 0.9 % IV SOLN
10.0000 mg | Freq: Once | INTRAVENOUS | Status: AC
  Administered 2015-10-27: 10 mg via INTRAVENOUS
  Filled 2015-10-27: qty 1

## 2015-10-27 MED ORDER — DIPHENHYDRAMINE HCL 50 MG/ML IJ SOLN
25.0000 mg | Freq: Once | INTRAMUSCULAR | Status: AC
  Administered 2015-10-27: 25 mg via INTRAVENOUS

## 2015-10-27 NOTE — Progress Notes (Signed)
ANC 1.4 - Dr. Benay Spice notified and ok'd to treat.

## 2015-10-27 NOTE — Patient Instructions (Addendum)
[facility] Discharge Instructions for Patients Receiving Chemotherapy  Today you received the following chemotherapy agents:  Taxol.  To help prevent nausea and vomiting after your treatment, we encourage you to take your nausea medication as prescribed.   If you develop nausea and vomiting that is not controlled by your nausea medication, call the clinic.   BELOW ARE SYMPTOMS THAT SHOULD BE REPORTED IMMEDIATELY:  *FEVER GREATER THAN 100.5 F  *CHILLS WITH OR WITHOUT FEVER  NAUSEA AND VOMITING THAT IS NOT CONTROLLED WITH YOUR NAUSEA MEDICATION  *UNUSUAL SHORTNESS OF BREATH  *UNUSUAL BRUISING OR BLEEDING  TENDERNESS IN MOUTH AND THROAT WITH OR WITHOUT PRESENCE OF ULCERS  *URINARY PROBLEMS  *BOWEL PROBLEMS  UNUSUAL RASH Items with * indicate a potential emergency and should be followed up as soon as possible.  Feel free to call the clinic you have any questions or concerns. The clinic phone number is [phone].  Please show the [place] at check-in to the Emergency Department and triage nurse.  Neutropenia Neutropenia is a condition that occurs when the level of a certain type of white blood cell (neutrophil) in your body becomes lower than normal. Neutrophils are made in the bone marrow and fight infections. These cells protect against bacteria and viruses. The fewer neutrophils you have, and the longer your body remains without them, the greater your risk of getting a severe infection becomes. CAUSES  The cause of neutropenia may be hard to determine. However, it is usually due to 3 main problems:   Decreased production of neutrophils. This may be due to:  Certain medicines such as chemotherapy.  Genetic problems.  Cancer.  Radiation treatments.  Vitamin deficiency.  Some pesticides.  Increased destruction of neutrophils. This may be due to:  Overwhelming infections.  Hemolytic anemia. This is when the body destroys its own blood  cells.  Chemotherapy.  Neutrophils moving to areas of the body where they cannot fight infections. This may be due to:  Dialysis procedures.  Conditions where the spleen becomes enlarged. Neutrophils are held in the spleen and are not available to the rest of the body.  Overwhelming infections. The neutrophils are held in the area of the infection and are not available to the rest of the body. SYMPTOMS  There are no specific symptoms of neutropenia. The lack of neutrophils can result in an infection, and an infection can cause various problems. DIAGNOSIS  Diagnosis is made by a blood test. A complete blood count is performed. The normal level of neutrophils in human blood differs with age and race. Infants have lower counts than older children and adults. African Americans have lower counts than Caucasians or Asians. The average adult level is 1500 cells/mm3 of blood. Neutrophil counts are interpreted as follows:  Greater than 1000 cells/mm3 gives normal protection against infection.  500 to 1000 cells/mm3 gives an increased risk for infection.  200 to 500 cells/mm3 is a greater risk for severe infection.  Lower than 200 cells/mm3 is a marked risk of infection. This may require hospitalization and treatment with antibiotic medicines. TREATMENT  Treatment depends on the underlying cause, severity, and presence of infections or symptoms. It also depends on your health. Your caregiver will discuss the treatment plan with you. Mild cases are often easily treated and have a good outcome. Preventative measures may also be started to limit your risk of infections. Treatment can include:  Taking antibiotics.  Stopping medicines that are known to cause neutropenia.  Correcting nutritional  deficiencies by eating green vegetables to supply folic acid and taking vitamin B supplements.  Stopping exposure to pesticides if your neutropenia is related to pesticide exposure.  Taking a blood growth  factor called sargramostim, pegfilgrastim, or filgrastim if you are undergoing chemotherapy for cancer. This stimulates white blood cell production.  Removal of the spleen if you have Felty's syndrome and have repeated infections. HOME CARE INSTRUCTIONS   Follow your caregiver's instructions about when you need to have blood work done.  Wash your hands often. Make sure others who come in contact with you also wash their hands.  Wash raw fruits and vegetables before eating them. They can carry bacteria and fungi.  Avoid people with colds or spreadable (contagious) diseases (chickenpox, herpes zoster, influenza).  Avoid large crowds.  Avoid construction areas. The dust can release fungus into the air.  Be cautious around children in daycare or school environments.  Take care of your respiratory system by coughing and deep breathing.  Bathe daily.  Protect your skin from cuts and burns.  Do not work in the garden or with flowers and plants.  Care for the mouth before and after meals by brushing with a soft toothbrush. If you have mucositis, do not use mouthwash. Mouthwash contains alcohol and can dry out the mouth even more.  Clean the area between the genitals and the anus (perineal area) after urination and bowel movements. Women need to wipe from front to back.  Use a water soluble lubricant during sexual intercourse and practice good hygiene after. Do not have intercourse if you are severely neutropenic. Check with your caregiver for guidelines.  Exercise daily as tolerated.  Avoid people who were vaccinated with a live vaccine in the past 30 days. You should not receive live vaccines (polio, typhoid).  Do not provide direct care for pets. Avoid animal droppings. Do not clean litter boxes and bird cages.  Do not share food utensils.  Do not use tampons, enemas, or rectal suppositories unless directed by your caregiver.  Use an electric razor to remove hair.  Wash your  hands after handling magazines, letters, and newspapers. SEEK IMMEDIATE MEDICAL CARE IF:   You have a fever.  You have chills or start to shake.  You feel nauseous or vomit.  You develop mouth sores.  You develop aches and pains.  You have redness and swelling around open wounds.  Your skin is warm to the touch.  You have pus coming from your wounds.  You develop swollen lymph nodes.  You feel weak or fatigued.  You develop red streaks on the skin. MAKE SURE YOU:  Understand these instructions.  Will watch your condition.  Will get help right away if you are not doing well or get worse.   This information is not intended to replace advice given to you by your health care provider. Make sure you discuss any questions you have with your health care provider.   Document Released: 12/16/2001 Document Revised: 09/18/2011 Document Reviewed: 01/06/2015 Elsevier Interactive Patient Education [employer].

## 2015-10-31 ENCOUNTER — Other Ambulatory Visit: Payer: Self-pay | Admitting: Oncology

## 2015-11-02 ENCOUNTER — Other Ambulatory Visit: Payer: Self-pay

## 2015-11-02 ENCOUNTER — Ambulatory Visit (INDEPENDENT_AMBULATORY_CARE_PROVIDER_SITE_OTHER): Payer: Medicare Other | Admitting: Emergency Medicine

## 2015-11-02 ENCOUNTER — Encounter: Payer: Self-pay | Admitting: Emergency Medicine

## 2015-11-02 VITALS — BP 124/76 | HR 95 | Ht 61.0 in | Wt 182.0 lb

## 2015-11-02 DIAGNOSIS — Z95828 Presence of other vascular implants and grafts: Secondary | ICD-10-CM

## 2015-11-02 DIAGNOSIS — J449 Chronic obstructive pulmonary disease, unspecified: Secondary | ICD-10-CM | POA: Diagnosis not present

## 2015-11-02 DIAGNOSIS — J309 Allergic rhinitis, unspecified: Secondary | ICD-10-CM | POA: Diagnosis not present

## 2015-11-02 DIAGNOSIS — I2510 Atherosclerotic heart disease of native coronary artery without angina pectoris: Secondary | ICD-10-CM | POA: Diagnosis not present

## 2015-11-02 NOTE — Progress Notes (Signed)
   Subjective:    Patient ID: Lynn Ramirez, female    DOB: Jun 28, 1941, 75 y.o.   MRN: [id number]  HPI 75 yo woman, has been followed by Dr Gwenette Greet for COPD, pleural effusions in setting of chemotherapy for serous carcinoma possibly Fallopian. History is taken from the patient and from her husband. She completed chemo August, is following with Dr Benay Spice, Dr Denman George. She has been tired, is having some neuropathy. Her breathing is stable. She is on spiriva and symbicort. Uses SABA rarely. She is interested in doing a trial off of spiriva. I personally reviewed her pulmonary function testing from 05/06/2012. This showed severe obstructive lung disease without bronchodilator response.   ROV 05/06/15 -- follow-up visit for COPD and pleural effusions in the setting of serous carcinoma on chemotherapy. At her last visit we tried stopping Spiriva to see if she would tolerate this. She doesn't notice any difference in her breathing off this. Her chemo ended 03/10/15. She still has periods of severe fatigue that can influence her breathing. She remains on Symbicort. She wants the flu shot and Prevnar-13, has now been off chemo x 7 weeks. She underwent cardiac stress testing 10/11 that was reassuring. Also tested her LE perfusion > no abnormality.   ROV 11/02/15 -- patient with a history of COPD, lateral pleural effusions and serous ovarian versus fallopian carcinoma. She was recently started on Taxol as salvage chemotherapy. She is on Symbicort, was started back on Spiriva in mid December and believes that she has probably benefited. Symbicort seems to do the most for her. She does get fatigued w chemo, is associated with some dyspnea. She is able to go to the [facility] and exercise. She rarely uses albuterol. She does not cough often, she does have a hoarse voice recently due to allergies. She is on loratadine now.      Review of Systems As per HPI     Objective:   Physical Exam Filed Vitals:   11/02/15 1436  11/02/15 1437  BP:  124/76  Pulse:  95  Height: 5\' 1"  (1.549 m)   Weight: 182 lb (82.555 kg)   SpO2:  98%   Gen: Pleasant, well-nourished, in no distress,  normal affect  ENT: No lesions,  mouth clear,  oropharynx clear, no postnasal drip  Neck: No JVD, no TMG, no carotid bruits  Lungs: No use of accessory muscles, small breaths, clear without rales or rhonchi  Cardiovascular: RRR, heart sounds normal, no murmur or gallops, no peripheral edema  Musculoskeletal: No deformities, no cyanosis or clubbing  Neuro: alert, non focal  Skin: Warm, no lesions or rashes      Assessment & Plan:  COPD (chronic obstructive pulmonary disease) ([place]) She appears to be overall clinically stable from a breathing standpoint. Her COPD has been managed with Symbicort and Spiriva since December. She would like to do a trial off of Spiriva to see if she misses this. She will call me and one month to let me know if her breathing changes on Symbicort alone. Otherwise I will follow with her in 6 months  Allergic rhinitis Continue loratadine daily

## 2015-11-02 NOTE — Assessment & Plan Note (Signed)
Continue loratadine daily

## 2015-11-02 NOTE — Assessment & Plan Note (Signed)
She appears to be overall clinically stable from a breathing standpoint. Her COPD has been managed with Symbicort and Spiriva since December. She would like to do a trial off of Spiriva to see if she misses this. She will call me and one month to let me know if her breathing changes on Symbicort alone. Otherwise I will follow with her in 6 months

## 2015-11-02 NOTE — Patient Instructions (Signed)
We will continue your Symbicort twice a day Please continue your loratadine (Claritin) every day  We will try stopping your Spiriva again to see if you miss it. Please call our office in 1 month to let us know if you have missed it.  Continue your other medications and follow with Dr Benay Spice as planned.  Follow with Dr Lamonte Sakai in 6 months or sooner if you have any problems

## 2015-11-03 ENCOUNTER — Ambulatory Visit (HOSPITAL_BASED_OUTPATIENT_CLINIC_OR_DEPARTMENT_OTHER): Payer: Medicare Other | Admitting: Nurse Practitioner

## 2015-11-03 ENCOUNTER — Telehealth: Payer: Self-pay | Admitting: *Deleted

## 2015-11-03 ENCOUNTER — Other Ambulatory Visit (HOSPITAL_BASED_OUTPATIENT_CLINIC_OR_DEPARTMENT_OTHER): Payer: Medicare Other

## 2015-11-03 ENCOUNTER — Ambulatory Visit: Payer: Medicare Other

## 2015-11-03 ENCOUNTER — Telehealth: Payer: Self-pay | Admitting: Oncology

## 2015-11-03 ENCOUNTER — Other Ambulatory Visit: Payer: Medicare Other

## 2015-11-03 ENCOUNTER — Ambulatory Visit (HOSPITAL_BASED_OUTPATIENT_CLINIC_OR_DEPARTMENT_OTHER): Payer: Medicare Other

## 2015-11-03 VITALS — BP 125/77 | HR 87 | Temp 97.6°F | Resp 17 | Ht 61.0 in | Wt 181.4 lb

## 2015-11-03 DIAGNOSIS — C5701 Malignant neoplasm of right fallopian tube: Secondary | ICD-10-CM

## 2015-11-03 DIAGNOSIS — Z95828 Presence of other vascular implants and grafts: Secondary | ICD-10-CM

## 2015-11-03 DIAGNOSIS — C561 Malignant neoplasm of right ovary: Secondary | ICD-10-CM

## 2015-11-03 DIAGNOSIS — C786 Secondary malignant neoplasm of retroperitoneum and peritoneum: Secondary | ICD-10-CM

## 2015-11-03 DIAGNOSIS — C569 Malignant neoplasm of unspecified ovary: Secondary | ICD-10-CM | POA: Diagnosis not present

## 2015-11-03 DIAGNOSIS — C562 Malignant neoplasm of left ovary: Secondary | ICD-10-CM

## 2015-11-03 DIAGNOSIS — C482 Malignant neoplasm of peritoneum, unspecified: Secondary | ICD-10-CM

## 2015-11-03 DIAGNOSIS — C799 Secondary malignant neoplasm of unspecified site: Secondary | ICD-10-CM

## 2015-11-03 DIAGNOSIS — Z5111 Encounter for antineoplastic chemotherapy: Secondary | ICD-10-CM

## 2015-11-03 LAB — CBC WITH DIFFERENTIAL/PLATELET
BASO%: 1.2 % (ref 0.0–2.0)
Basophils Absolute: 0 10*3/uL (ref 0.0–0.1)
EOS%: 1.5 % (ref 0.0–7.0)
Eosinophils Absolute: 0 10*3/uL (ref 0.0–0.5)
HCT: 34.4 % — ABNORMAL LOW (ref 34.8–46.6)
HGB: 11.1 g/dL — ABNORMAL LOW (ref 11.6–15.9)
LYMPH%: 34.7 % (ref 14.0–49.7)
MCH: 30 pg (ref 25.1–34.0)
MCHC: 32.3 g/dL (ref 31.5–36.0)
MCV: 92.9 fL (ref 79.5–101.0)
MONO#: 0.2 10*3/uL (ref 0.1–0.9)
MONO%: 7.1 % (ref 0.0–14.0)
NEUT%: 55.5 % (ref 38.4–76.8)
NEUTROS ABS: 1.5 10*3/uL (ref 1.5–6.5)
Platelets: 235 10*3/uL (ref 145–400)
RBC: 3.71 10*6/uL (ref 3.70–5.45)
RDW: 16.6 % — ABNORMAL HIGH (ref 11.2–14.5)
WBC: 2.6 10*3/uL — AB (ref 3.9–10.3)
lymph#: 0.9 10*3/uL (ref 0.9–3.3)

## 2015-11-03 LAB — COMPREHENSIVE METABOLIC PANEL
ALT: 20 U/L (ref 0–55)
AST: 15 U/L (ref 5–34)
Albumin: 3.6 g/dL (ref 3.5–5.0)
Alkaline Phosphatase: 68 U/L (ref 40–150)
Anion Gap: 7 mEq/L (ref 3–11)
BUN: 18.9 mg/dL (ref 7.0–26.0)
CALCIUM: 9.9 mg/dL (ref 8.4–10.4)
CHLORIDE: 103 meq/L (ref 98–109)
CO2: 29 mEq/L (ref 22–29)
CREATININE: 1 mg/dL (ref 0.6–1.1)
EGFR: 60 mL/min/{1.73_m2} — ABNORMAL LOW (ref >90)
GLUCOSE: 113 mg/dL (ref 70–140)
POTASSIUM: 4.1 meq/L (ref 3.5–5.1)
SODIUM: 139 meq/L (ref 136–145)
Total Bilirubin: 0.31 mg/dL (ref 0.20–1.20)
Total Protein: 6.8 g/dL (ref 6.4–8.3)

## 2015-11-03 MED ORDER — SODIUM CHLORIDE 0.9 % IV SOLN
80.0000 mg/m2 | Freq: Once | INTRAVENOUS | Status: AC
  Administered 2015-11-03: 150 mg via INTRAVENOUS
  Filled 2015-11-03: qty 25

## 2015-11-03 MED ORDER — FAMOTIDINE IN NACL 20-0.9 MG/50ML-% IV SOLN
INTRAVENOUS | Status: AC
  Filled 2015-11-03: qty 50

## 2015-11-03 MED ORDER — SODIUM CHLORIDE 0.9 % IV SOLN
10.0000 mg | Freq: Once | INTRAVENOUS | Status: AC
  Administered 2015-11-03: 10 mg via INTRAVENOUS
  Filled 2015-11-03: qty 1

## 2015-11-03 MED ORDER — SODIUM CHLORIDE 0.9 % IJ SOLN
10.0000 mL | INTRAMUSCULAR | Status: DC | PRN
  Administered 2015-11-03: 10 mL via INTRAVENOUS
  Filled 2015-11-03: qty 10

## 2015-11-03 MED ORDER — DIPHENHYDRAMINE HCL 50 MG/ML IJ SOLN
INTRAMUSCULAR | Status: AC
  Filled 2015-11-03: qty 1

## 2015-11-03 MED ORDER — HEPARIN SOD (PORK) LOCK FLUSH 100 UNIT/ML IV SOLN
500.0000 [IU] | Freq: Once | INTRAVENOUS | Status: AC | PRN
  Administered 2015-11-03: 500 [IU]
  Filled 2015-11-03: qty 5

## 2015-11-03 MED ORDER — SODIUM CHLORIDE 0.9% FLUSH
10.0000 mL | INTRAVENOUS | Status: DC | PRN
  Administered 2015-11-03: 10 mL
  Filled 2015-11-03: qty 10

## 2015-11-03 MED ORDER — SODIUM CHLORIDE 0.9 % IV SOLN
Freq: Once | INTRAVENOUS | Status: AC
  Administered 2015-11-03: 11:00:00 via INTRAVENOUS

## 2015-11-03 MED ORDER — DIPHENHYDRAMINE HCL 50 MG/ML IJ SOLN
25.0000 mg | Freq: Once | INTRAMUSCULAR | Status: AC
  Administered 2015-11-03: 25 mg via INTRAVENOUS

## 2015-11-03 MED ORDER — FAMOTIDINE IN NACL 20-0.9 MG/50ML-% IV SOLN
20.0000 mg | Freq: Once | INTRAVENOUS | Status: AC
  Administered 2015-11-03: 20 mg via INTRAVENOUS

## 2015-11-03 NOTE — Patient Instructions (Signed)

## 2015-11-03 NOTE — Progress Notes (Signed)
[place] OFFICE PROGRESS NOTE   Diagnosis:  Ovarian cancer  INTERVAL HISTORY:   Lynn Ramirez returns as scheduled. She continues weekly Taxol. She completed the third weekly treatment on 10/27/2015. No significant nausea. She had some mild nausea this morning. No mouth sores. Bowel habits continue to be somewhat erratic. She had frequent bowel movements over the weekend of various consistency. She had a normal bowel movement this morning. She continues a laxative regimen. She denies abdominal pain. She has mild intermittent numbness in the fingertips and feet. She has a good appetite. She continues a low residual diet.  Objective:  Vital signs in last 24 hours:  Blood pressure 125/77, pulse 87, temperature 97.6 F (36.4 C), temperature source Oral, resp. rate 17, height 5\' 1"  (1.549 m), weight 181 lb 6.4 oz (82.283 kg), SpO2 97 %.    HEENT: No thrush or ulcers. Resp: Lungs clear bilaterally. Cardio: Regular rate and rhythm. GI: Abdomen is soft. Mild tenderness right upper abdomen. Nondistended. No mass. No hepatomegaly. Vascular: No leg edema. Skin: No rash. Port-A-Cath without erythema.    Lab Results:  Lab Results  Component Value Date   WBC 2.6* 11/03/2015   HGB 11.1* 11/03/2015   HCT 34.4* 11/03/2015   MCV 92.9 11/03/2015   PLT 235 11/03/2015   NEUTROABS 1.5 11/03/2015    Imaging:  No results found.  Medications: I have reviewed the patient's current medications.  Assessment/Plan: 1. 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, immunohistochemical profile consistent with a GYN primary, elevated CA 125  Staging CTs of the chest, abdomen, and pelvis on 10/06/2014 revealed a loculated right pleural effusion, ascites, and omental/mesenteric thickening  Cytology from peritoneal fluid 10/13/2014 revealed malignant cells consistent with metastatic adenocarcinoma  Biopsy of an omental mass on  11/02/2014 revealed invasive serous carcinoma with psammoma bodies  Cycle 1 Taxol/carboplatin 10/28/2014  Cycle 2 Taxol/carboplatin 11/18/2014  Cycle 3 Taxol/carboplatin 12/09/2014  CT scan 12/23/2014 with interval improvement in peritoneal carcinomatosis with near-complete resolution of ascites and decreased omental nodularity. Significant improvement in malignant right pleural effusion.  Status post robotic-assisted laparoscopic hysterectomy with bilateral salpingoophorectomy, omentectomy, radical tumor debulking 12/29/2014. Per Dr. Serita Grit office note 01/14/2015 cytoreduction was optimal with residual disease remaining only in the 1 mm implants on the small intestine. 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  Cycle 1 adjuvant Taxol/carboplatin 01/20/2015  Cycle 2 adjuvant Taxol/carboplatin 02/10/2015  Cycle 3 adjuvant Taxol/carboplatin 03/10/2015  CA 125 on 1013 2016-42  CTs of the chest, abdomen, and pelvis 05/31/2015 with no evidence of progressive ovarian cancer  CTs of the chest, abdomen, and pelvis 08/07/2015 and 08/08/2015-no evidence of progressive ovarian cancer  Peritoneal studding noted at the time of the cholecystectomy procedure 08/09/2015  Elevated CA 125 10/07/2015  CT 10/07/2015 with stricturing at the sigmoid colon, constipation, and omental nodularity  Initiation of salvage weekly Taxol chemotherapy 10/13/2015, 10/20/2015, 10/27/2015, 11/03/2015  2. COPD 3. Dyspnea secondary to COPD and the large right pleural effusion, status post therapeutic thoracentesis procedures 09/30/2014,10/09/2014, and 10/21/2014. Left thoracentesis 10/30/2014 4. Left nephrectomy as a child 5. Delayed  nausea following cycle 1 Taxol/carboplatin, Aloxi/Emend added with cycle 2 with improvement. 6. Right lower extremity edema, right calf pain 12/09/2014. Negative venous Doppler 12/09/2014. 7. Diffuse pruritus following cycle  1 adjuvant Taxol/carboplatin 01/20/2015, no rash, resolved with a steroid dose pack 8. Thrombocytopenia second to chemotherapy, the carboplatin was dose reduced with cycle 2 adjuvant Taxol/carboplatin 02/10/2015 9. Chemotherapy-induced peripheral neuropathy-improved 10. Admission with acute cholecystitis 08/07/2015, status post a cholecystectomy 08/09/2015 11. Admission 10/08/2015 with abdominal pain/constipation, improved with bowel rest and laxatives    Disposition: Lynn Ramirez appears stable. She has completed 3 weekly treatments with Taxol. Plan to proceed with week 4 today as scheduled. We will follow-up on the CA-125 from today. She will be off of treatment next week. She will return to resume weekly Taxol 11/17/2015. She will continue the current low-residue diet and laxative regimen. We will see her in follow-up on 12/01/2015. She will contact the office in the interim with any problems.  Plan reviewed with Dr. Benay Spice.    Ned Card ANP/GNP-BC   11/03/2015  9:57 AM

## 2015-11-03 NOTE — Telephone Encounter (Signed)
Gave and printed appt sched and avs fo rpt for May °

## 2015-11-03 NOTE — Telephone Encounter (Signed)
Per staff message and POF I have scheduled appts. Advised scheduler of appts. JMW  

## 2015-11-03 NOTE — Patient Instructions (Signed)
[facility] Discharge Instructions for Patients Receiving Chemotherapy  Today you received the following chemotherapy agents:  Taxol  To help prevent nausea and vomiting after your treatment, we encourage you to take your nausea medication as prescribed.   If you develop nausea and vomiting that is not controlled by your nausea medication, call the clinic.   BELOW ARE SYMPTOMS THAT SHOULD BE REPORTED IMMEDIATELY:  *FEVER GREATER THAN 100.5 F  *CHILLS WITH OR WITHOUT FEVER  NAUSEA AND VOMITING THAT IS NOT CONTROLLED WITH YOUR NAUSEA MEDICATION  *UNUSUAL SHORTNESS OF BREATH  *UNUSUAL BRUISING OR BLEEDING  TENDERNESS IN MOUTH AND THROAT WITH OR WITHOUT PRESENCE OF ULCERS  *URINARY PROBLEMS  *BOWEL PROBLEMS  UNUSUAL RASH Items with * indicate a potential emergency and should be followed up as soon as possible.  Feel free to call the clinic you have any questions or concerns. The clinic phone number is [phone].  Please show the CHEMO ALERT CARD at check-in to the Emergency Department and triage nurse.   

## 2015-11-04 ENCOUNTER — Telehealth: Payer: Self-pay | Admitting: *Deleted

## 2015-11-04 DIAGNOSIS — E538 Deficiency of other specified B group vitamins: Secondary | ICD-10-CM | POA: Diagnosis not present

## 2015-11-04 LAB — CA 125: Cancer Antigen (CA) 125: 246.5 U/mL — ABNORMAL HIGH (ref 0.0–38.1)

## 2015-11-04 NOTE — Telephone Encounter (Signed)
Message from pt requesting CA125 result. Informed her of result. She verbalized understanding.

## 2015-11-17 ENCOUNTER — Other Ambulatory Visit (HOSPITAL_BASED_OUTPATIENT_CLINIC_OR_DEPARTMENT_OTHER): Payer: Medicare Other

## 2015-11-17 ENCOUNTER — Ambulatory Visit (HOSPITAL_BASED_OUTPATIENT_CLINIC_OR_DEPARTMENT_OTHER): Payer: Medicare Other

## 2015-11-17 ENCOUNTER — Other Ambulatory Visit: Payer: Medicare Other

## 2015-11-17 ENCOUNTER — Ambulatory Visit: Payer: Medicare Other

## 2015-11-17 ENCOUNTER — Encounter: Payer: Self-pay | Admitting: Nutrition

## 2015-11-17 VITALS — BP 150/80 | HR 78 | Temp 97.0°F | Resp 18

## 2015-11-17 DIAGNOSIS — C482 Malignant neoplasm of peritoneum, unspecified: Secondary | ICD-10-CM

## 2015-11-17 DIAGNOSIS — Z95828 Presence of other vascular implants and grafts: Secondary | ICD-10-CM

## 2015-11-17 DIAGNOSIS — C799 Secondary malignant neoplasm of unspecified site: Secondary | ICD-10-CM

## 2015-11-17 DIAGNOSIS — C569 Malignant neoplasm of unspecified ovary: Secondary | ICD-10-CM

## 2015-11-17 DIAGNOSIS — C561 Malignant neoplasm of right ovary: Secondary | ICD-10-CM

## 2015-11-17 DIAGNOSIS — C562 Malignant neoplasm of left ovary: Secondary | ICD-10-CM

## 2015-11-17 DIAGNOSIS — Z5111 Encounter for antineoplastic chemotherapy: Secondary | ICD-10-CM | POA: Diagnosis not present

## 2015-11-17 LAB — COMPREHENSIVE METABOLIC PANEL
ALBUMIN: 3.5 g/dL (ref 3.5–5.0)
ALK PHOS: 84 U/L (ref 40–150)
ALT: 17 U/L (ref 0–55)
AST: 13 U/L (ref 5–34)
Anion Gap: 6 mEq/L (ref 3–11)
BUN: 17.8 mg/dL (ref 7.0–26.0)
CALCIUM: 9.6 mg/dL (ref 8.4–10.4)
CO2: 32 mEq/L — ABNORMAL HIGH (ref 22–29)
CREATININE: 1.1 mg/dL (ref 0.6–1.1)
Chloride: 103 mEq/L (ref 98–109)
EGFR: 48 mL/min/{1.73_m2} — ABNORMAL LOW (ref >90)
Glucose: 130 mg/dl (ref 70–140)
POTASSIUM: 3.7 meq/L (ref 3.5–5.1)
Sodium: 141 mEq/L (ref 136–145)
Total Bilirubin: <0.3 mg/dL (ref 0.20–1.20)
Total Protein: 6.9 g/dL (ref 6.4–8.3)

## 2015-11-17 LAB — CBC WITH DIFFERENTIAL/PLATELET
BASO%: 0.8 % (ref 0.0–2.0)
BASOS ABS: 0 10*3/uL (ref 0.0–0.1)
EOS%: 0.8 % (ref 0.0–7.0)
Eosinophils Absolute: 0 10*3/uL (ref 0.0–0.5)
HEMATOCRIT: 34.5 % — AB (ref 34.8–46.6)
HEMOGLOBIN: 11.2 g/dL — AB (ref 11.6–15.9)
LYMPH#: 1.2 10*3/uL (ref 0.9–3.3)
LYMPH%: 33.9 % (ref 14.0–49.7)
MCH: 30.9 pg (ref 25.1–34.0)
MCHC: 32.5 g/dL (ref 31.5–36.0)
MCV: 95.3 fL (ref 79.5–101.0)
MONO#: 0.4 10*3/uL (ref 0.1–0.9)
MONO%: 11.2 % (ref 0.0–14.0)
NEUT#: 2 10*3/uL (ref 1.5–6.5)
NEUT%: 53.3 % (ref 38.4–76.8)
PLATELETS: 266 10*3/uL (ref 145–400)
RBC: 3.62 10*6/uL — ABNORMAL LOW (ref 3.70–5.45)
RDW: 17.1 % — AB (ref 11.2–14.5)
WBC: 3.7 10*3/uL — ABNORMAL LOW (ref 3.9–10.3)

## 2015-11-17 MED ORDER — SODIUM CHLORIDE 0.9 % IV SOLN
Freq: Once | INTRAVENOUS | Status: AC
  Administered 2015-11-17: 12:00:00 via INTRAVENOUS

## 2015-11-17 MED ORDER — SODIUM CHLORIDE 0.9 % IJ SOLN
10.0000 mL | INTRAMUSCULAR | Status: DC | PRN
  Administered 2015-11-17: 10 mL via INTRAVENOUS
  Filled 2015-11-17: qty 10

## 2015-11-17 MED ORDER — FAMOTIDINE IN NACL 20-0.9 MG/50ML-% IV SOLN
INTRAVENOUS | Status: AC
  Filled 2015-11-17: qty 50

## 2015-11-17 MED ORDER — SODIUM CHLORIDE 0.9 % IV SOLN
80.0000 mg/m2 | Freq: Once | INTRAVENOUS | Status: AC
  Administered 2015-11-17: 150 mg via INTRAVENOUS
  Filled 2015-11-17: qty 25

## 2015-11-17 MED ORDER — ZOLPIDEM TARTRATE 5 MG PO TABS: 5.0000 mg | ORAL_TABLET | Freq: Every evening | ORAL | Status: DC | PRN

## 2015-11-17 MED ORDER — SODIUM CHLORIDE 0.9% FLUSH
10.0000 mL | INTRAVENOUS | Status: DC | PRN
  Administered 2015-11-17: 10 mL
  Filled 2015-11-17: qty 10

## 2015-11-17 MED ORDER — DIPHENHYDRAMINE HCL 50 MG/ML IJ SOLN
INTRAMUSCULAR | Status: AC
  Filled 2015-11-17: qty 1

## 2015-11-17 MED ORDER — SODIUM CHLORIDE 0.9 % IV SOLN
10.0000 mg | Freq: Once | INTRAVENOUS | Status: AC
  Administered 2015-11-17: 10 mg via INTRAVENOUS
  Filled 2015-11-17: qty 1

## 2015-11-17 MED ORDER — HEPARIN SOD (PORK) LOCK FLUSH 100 UNIT/ML IV SOLN
500.0000 [IU] | Freq: Once | INTRAVENOUS | Status: AC | PRN
  Administered 2015-11-17: 500 [IU]
  Filled 2015-11-17: qty 5

## 2015-11-17 MED ORDER — FAMOTIDINE IN NACL 20-0.9 MG/50ML-% IV SOLN
20.0000 mg | Freq: Once | INTRAVENOUS | Status: AC
  Administered 2015-11-17: 20 mg via INTRAVENOUS

## 2015-11-17 MED ORDER — DIPHENHYDRAMINE HCL 50 MG/ML IJ SOLN
25.0000 mg | Freq: Once | INTRAMUSCULAR | Status: AC
  Administered 2015-11-17: 25 mg via INTRAVENOUS

## 2015-11-17 NOTE — Patient Instructions (Signed)
[facility] Discharge Instructions for Patients Receiving Chemotherapy  Today you received the following chemotherapy agents:  Taxol  To help prevent nausea and vomiting after your treatment, we encourage you to take your nausea medication as prescribed.   If you develop nausea and vomiting that is not controlled by your nausea medication, call the clinic.   BELOW ARE SYMPTOMS THAT SHOULD BE REPORTED IMMEDIATELY:  *FEVER GREATER THAN 100.5 F  *CHILLS WITH OR WITHOUT FEVER  NAUSEA AND VOMITING THAT IS NOT CONTROLLED WITH YOUR NAUSEA MEDICATION  *UNUSUAL SHORTNESS OF BREATH  *UNUSUAL BRUISING OR BLEEDING  TENDERNESS IN MOUTH AND THROAT WITH OR WITHOUT PRESENCE OF ULCERS  *URINARY PROBLEMS  *BOWEL PROBLEMS  UNUSUAL RASH Items with * indicate a potential emergency and should be followed up as soon as possible.  Feel free to call the clinic you have any questions or concerns. The clinic phone number is [phone].  Please show the CHEMO ALERT CARD at check-in to the Emergency Department and triage nurse.   

## 2015-11-17 NOTE — Patient Instructions (Signed)

## 2015-11-17 NOTE — Progress Notes (Signed)
Patient called reporting she is still on a low fiber diet and had questions. I returned her call and answered her questions.

## 2015-11-22 ENCOUNTER — Telehealth: Payer: Self-pay | Admitting: Cardiology

## 2015-11-22 NOTE — Telephone Encounter (Signed)
Pt having blood work done before chemo, will go this Wednesday and next Wednesday in the chemo lab -wants to know if order can be put in -they will draw from her port and it's much easier on her -bmp and lipid- pls [phone]

## 2015-11-22 NOTE — Telephone Encounter (Signed)
Pt states that back in March, Dr Meda Coffee ordered for her to have a bmet, lipids, and LFTs done sometime in May.  Pt states she is going to her Oncology appt on 11/24/15 to have labs done there, prior to her chemo.  Pt was wanting to know if she could just get her labs Dr Meda Coffee ordered, connected to her Oncology lab appt on 11/24/15, to save her another stick.  Pt states that where she goes to have her lab done through oncology, is with the [facility] system.  Informed the pt that would be no problem at all.  Informed the pt that I will link the order for her bmet, lipids, and LFTs to her already scheduled lab appt on 11/24/15 at Oncology dept.  Advised the pt to call her Oncology office and ask if they can see the orders linked to this appt.  Informed the pt to call our office back if they are unable to visualize the orders linked to the 5/17 appt. Pt verbalized understanding, agrees with this plan, and gracious for all the assistance provided.

## 2015-11-23 DIAGNOSIS — E785 Hyperlipidemia, unspecified: Secondary | ICD-10-CM | POA: Diagnosis not present

## 2015-11-23 DIAGNOSIS — Z79899 Other long term (current) drug therapy: Secondary | ICD-10-CM | POA: Diagnosis not present

## 2015-11-24 ENCOUNTER — Other Ambulatory Visit (HOSPITAL_BASED_OUTPATIENT_CLINIC_OR_DEPARTMENT_OTHER): Payer: Medicare Other

## 2015-11-24 ENCOUNTER — Ambulatory Visit: Payer: Medicare Other

## 2015-11-24 ENCOUNTER — Ambulatory Visit (HOSPITAL_BASED_OUTPATIENT_CLINIC_OR_DEPARTMENT_OTHER): Payer: Medicare Other

## 2015-11-24 VITALS — BP 149/68 | HR 76 | Temp 97.7°F | Resp 19

## 2015-11-24 DIAGNOSIS — Z5111 Encounter for antineoplastic chemotherapy: Secondary | ICD-10-CM

## 2015-11-24 DIAGNOSIS — C799 Secondary malignant neoplasm of unspecified site: Secondary | ICD-10-CM

## 2015-11-24 DIAGNOSIS — C561 Malignant neoplasm of right ovary: Secondary | ICD-10-CM

## 2015-11-24 DIAGNOSIS — C562 Malignant neoplasm of left ovary: Secondary | ICD-10-CM

## 2015-11-24 DIAGNOSIS — Z79899 Other long term (current) drug therapy: Secondary | ICD-10-CM

## 2015-11-24 DIAGNOSIS — E785 Hyperlipidemia, unspecified: Secondary | ICD-10-CM

## 2015-11-24 DIAGNOSIS — C5701 Malignant neoplasm of right fallopian tube: Secondary | ICD-10-CM

## 2015-11-24 DIAGNOSIS — Z95828 Presence of other vascular implants and grafts: Secondary | ICD-10-CM

## 2015-11-24 DIAGNOSIS — C569 Malignant neoplasm of unspecified ovary: Secondary | ICD-10-CM

## 2015-11-24 DIAGNOSIS — C482 Malignant neoplasm of peritoneum, unspecified: Secondary | ICD-10-CM

## 2015-11-24 LAB — CBC WITH DIFFERENTIAL/PLATELET
BASO%: 0.9 % (ref 0.0–2.0)
BASOS ABS: 0 10*3/uL (ref 0.0–0.1)
EOS ABS: 0 10*3/uL (ref 0.0–0.5)
EOS%: 1 % (ref 0.0–7.0)
HEMATOCRIT: 33.6 % — AB (ref 34.8–46.6)
HGB: 11 g/dL — ABNORMAL LOW (ref 11.6–15.9)
LYMPH#: 1.1 10*3/uL (ref 0.9–3.3)
LYMPH%: 23 % (ref 14.0–49.7)
MCH: 30.9 pg (ref 25.1–34.0)
MCHC: 32.8 g/dL (ref 31.5–36.0)
MCV: 94.1 fL (ref 79.5–101.0)
MONO#: 0.2 10*3/uL (ref 0.1–0.9)
MONO%: 4.7 % (ref 0.0–14.0)
NEUT#: 3.4 10*3/uL (ref 1.5–6.5)
NEUT%: 70.4 % (ref 38.4–76.8)
PLATELETS: 258 10*3/uL (ref 145–400)
RBC: 3.57 10*6/uL — ABNORMAL LOW (ref 3.70–5.45)
RDW: 17.3 % — ABNORMAL HIGH (ref 11.2–14.5)
WBC: 4.9 10*3/uL (ref 3.9–10.3)

## 2015-11-24 LAB — COMPREHENSIVE METABOLIC PANEL
ALBUMIN: 3.5 g/dL (ref 3.5–5.0)
ALK PHOS: 71 U/L (ref 40–150)
ALT: 17 U/L (ref 0–55)
ANION GAP: 6 meq/L (ref 3–11)
AST: 11 U/L (ref 5–34)
BUN: 19.7 mg/dL (ref 7.0–26.0)
CALCIUM: 9.9 mg/dL (ref 8.4–10.4)
CHLORIDE: 101 meq/L (ref 98–109)
CO2: 32 mEq/L — ABNORMAL HIGH (ref 22–29)
Creatinine: 1 mg/dL (ref 0.6–1.1)
EGFR: 60 mL/min/{1.73_m2} — AB (ref >90)
Glucose: 109 mg/dl (ref 70–140)
POTASSIUM: 3.8 meq/L (ref 3.5–5.1)
Sodium: 139 mEq/L (ref 136–145)
Total Bilirubin: 0.31 mg/dL (ref 0.20–1.20)
Total Protein: 6.9 g/dL (ref 6.4–8.3)

## 2015-11-24 MED ORDER — DIPHENHYDRAMINE HCL 50 MG/ML IJ SOLN
INTRAMUSCULAR | Status: AC
  Filled 2015-11-24: qty 1

## 2015-11-24 MED ORDER — SODIUM CHLORIDE 0.9 % IV SOLN
Freq: Once | INTRAVENOUS | Status: AC
  Administered 2015-11-24: 10:00:00 via INTRAVENOUS

## 2015-11-24 MED ORDER — FAMOTIDINE IN NACL 20-0.9 MG/50ML-% IV SOLN
20.0000 mg | Freq: Once | INTRAVENOUS | Status: AC
  Administered 2015-11-24: 20 mg via INTRAVENOUS

## 2015-11-24 MED ORDER — SODIUM CHLORIDE 0.9% FLUSH
10.0000 mL | INTRAVENOUS | Status: DC | PRN
  Administered 2015-11-24: 10 mL
  Filled 2015-11-24: qty 10

## 2015-11-24 MED ORDER — HEPARIN SOD (PORK) LOCK FLUSH 100 UNIT/ML IV SOLN
500.0000 [IU] | Freq: Once | INTRAVENOUS | Status: AC | PRN
  Administered 2015-11-24: 500 [IU]
  Filled 2015-11-24: qty 5

## 2015-11-24 MED ORDER — DIPHENHYDRAMINE HCL 50 MG/ML IJ SOLN
25.0000 mg | Freq: Once | INTRAMUSCULAR | Status: AC
  Administered 2015-11-24: 25 mg via INTRAVENOUS

## 2015-11-24 MED ORDER — DEXAMETHASONE SODIUM PHOSPHATE 100 MG/10ML IJ SOLN
10.0000 mg | Freq: Once | INTRAMUSCULAR | Status: AC
  Administered 2015-11-24: 10 mg via INTRAVENOUS
  Filled 2015-11-24: qty 1

## 2015-11-24 MED ORDER — FAMOTIDINE IN NACL 20-0.9 MG/50ML-% IV SOLN
INTRAVENOUS | Status: AC
  Filled 2015-11-24: qty 50

## 2015-11-24 MED ORDER — SODIUM CHLORIDE 0.9 % IJ SOLN
10.0000 mL | INTRAMUSCULAR | Status: DC | PRN
  Administered 2015-11-24: 10 mL via INTRAVENOUS
  Filled 2015-11-24: qty 10

## 2015-11-24 MED ORDER — SODIUM CHLORIDE 0.9 % IV SOLN
80.0000 mg/m2 | Freq: Once | INTRAVENOUS | Status: AC
  Administered 2015-11-24: 150 mg via INTRAVENOUS
  Filled 2015-11-24: qty 25

## 2015-11-24 NOTE — Patient Instructions (Signed)

## 2015-11-24 NOTE — Patient Instructions (Signed)
[facility] Discharge Instructions for Patients Receiving Chemotherapy  Today you received the following chemotherapy agents Taxol   To help prevent nausea and vomiting after your treatment, we encourage you to take your nausea medication as directed.   If you develop nausea and vomiting that is not controlled by your nausea medication, call the clinic.   BELOW ARE SYMPTOMS THAT SHOULD BE REPORTED IMMEDIATELY:  *FEVER GREATER THAN 100.5 F  *CHILLS WITH OR WITHOUT FEVER  NAUSEA AND VOMITING THAT IS NOT CONTROLLED WITH YOUR NAUSEA MEDICATION  *UNUSUAL SHORTNESS OF BREATH  *UNUSUAL BRUISING OR BLEEDING  TENDERNESS IN MOUTH AND THROAT WITH OR WITHOUT PRESENCE OF ULCERS  *URINARY PROBLEMS  *BOWEL PROBLEMS  UNUSUAL RASH Items with * indicate a potential emergency and should be followed up as soon as possible.  Feel free to call the clinic you have any questions or concerns. The clinic phone number is [phone].  Please show the CHEMO ALERT CARD at check-in to the Emergency Department and triage nurse.   

## 2015-12-01 ENCOUNTER — Ambulatory Visit: Payer: Medicare Other

## 2015-12-01 ENCOUNTER — Ambulatory Visit (HOSPITAL_BASED_OUTPATIENT_CLINIC_OR_DEPARTMENT_OTHER): Payer: Medicare Other | Admitting: Oncology

## 2015-12-01 ENCOUNTER — Ambulatory Visit (HOSPITAL_BASED_OUTPATIENT_CLINIC_OR_DEPARTMENT_OTHER): Payer: Medicare Other

## 2015-12-01 ENCOUNTER — Other Ambulatory Visit (HOSPITAL_BASED_OUTPATIENT_CLINIC_OR_DEPARTMENT_OTHER): Payer: Medicare Other

## 2015-12-01 ENCOUNTER — Telehealth: Payer: Self-pay | Admitting: Oncology

## 2015-12-01 VITALS — BP 141/64 | HR 88 | Temp 98.4°F | Resp 19 | Ht 61.0 in | Wt 182.2 lb

## 2015-12-01 DIAGNOSIS — C5701 Malignant neoplasm of right fallopian tube: Secondary | ICD-10-CM

## 2015-12-01 DIAGNOSIS — Z5111 Encounter for antineoplastic chemotherapy: Secondary | ICD-10-CM | POA: Diagnosis present

## 2015-12-01 DIAGNOSIS — N1339 Other hydronephrosis: Secondary | ICD-10-CM

## 2015-12-01 DIAGNOSIS — Q600 Renal agenesis, unilateral: Secondary | ICD-10-CM | POA: Diagnosis not present

## 2015-12-01 DIAGNOSIS — C799 Secondary malignant neoplasm of unspecified site: Secondary | ICD-10-CM

## 2015-12-01 DIAGNOSIS — C562 Malignant neoplasm of left ovary: Secondary | ICD-10-CM | POA: Diagnosis not present

## 2015-12-01 DIAGNOSIS — N1330 Unspecified hydronephrosis: Secondary | ICD-10-CM | POA: Diagnosis not present

## 2015-12-01 DIAGNOSIS — C561 Malignant neoplasm of right ovary: Secondary | ICD-10-CM | POA: Diagnosis not present

## 2015-12-01 DIAGNOSIS — C569 Malignant neoplasm of unspecified ovary: Secondary | ICD-10-CM

## 2015-12-01 DIAGNOSIS — J449 Chronic obstructive pulmonary disease, unspecified: Secondary | ICD-10-CM | POA: Diagnosis not present

## 2015-12-01 DIAGNOSIS — R109 Unspecified abdominal pain: Secondary | ICD-10-CM | POA: Diagnosis not present

## 2015-12-01 DIAGNOSIS — C786 Secondary malignant neoplasm of retroperitoneum and peritoneum: Secondary | ICD-10-CM

## 2015-12-01 DIAGNOSIS — C482 Malignant neoplasm of peritoneum, unspecified: Secondary | ICD-10-CM

## 2015-12-01 DIAGNOSIS — Z95828 Presence of other vascular implants and grafts: Secondary | ICD-10-CM

## 2015-12-01 LAB — CBC WITH DIFFERENTIAL/PLATELET
BASO%: 1.1 % (ref 0.0–2.0)
BASOS ABS: 0 10*3/uL (ref 0.0–0.1)
EOS%: 1.5 % (ref 0.0–7.0)
Eosinophils Absolute: 0 10*3/uL (ref 0.0–0.5)
HEMATOCRIT: 32.4 % — AB (ref 34.8–46.6)
HEMOGLOBIN: 10.7 g/dL — AB (ref 11.6–15.9)
LYMPH#: 0.9 10*3/uL (ref 0.9–3.3)
LYMPH%: 33.1 % (ref 14.0–49.7)
MCH: 30.9 pg (ref 25.1–34.0)
MCHC: 33 g/dL (ref 31.5–36.0)
MCV: 93.8 fL (ref 79.5–101.0)
MONO#: 0.2 10*3/uL (ref 0.1–0.9)
MONO%: 6.1 % (ref 0.0–14.0)
NEUT#: 1.6 10*3/uL (ref 1.5–6.5)
NEUT%: 58.2 % (ref 38.4–76.8)
PLATELETS: 241 10*3/uL (ref 145–400)
RBC: 3.46 10*6/uL — AB (ref 3.70–5.45)
RDW: 17.6 % — AB (ref 11.2–14.5)
WBC: 2.7 10*3/uL — ABNORMAL LOW (ref 3.9–10.3)

## 2015-12-01 LAB — COMPREHENSIVE METABOLIC PANEL
ALT: 15 U/L (ref 0–55)
AST: 12 U/L (ref 5–34)
Albumin: 3.6 g/dL (ref 3.5–5.0)
Alkaline Phosphatase: 67 U/L (ref 40–150)
Anion Gap: 7 mEq/L (ref 3–11)
BUN: 23.6 mg/dL (ref 7.0–26.0)
CO2: 31 meq/L — AB (ref 22–29)
Calcium: 9.9 mg/dL (ref 8.4–10.4)
Chloride: 103 mEq/L (ref 98–109)
Creatinine: 0.9 mg/dL (ref 0.6–1.1)
EGFR: 63 mL/min/{1.73_m2} — AB (ref >90)
GLUCOSE: 115 mg/dL (ref 70–140)
POTASSIUM: 3.8 meq/L (ref 3.5–5.1)
SODIUM: 141 meq/L (ref 136–145)
TOTAL PROTEIN: 6.7 g/dL (ref 6.4–8.3)
Total Bilirubin: 0.39 mg/dL (ref 0.20–1.20)

## 2015-12-01 MED ORDER — SODIUM CHLORIDE 0.9 % IV SOLN
10.0000 mg | Freq: Once | INTRAVENOUS | Status: AC
  Administered 2015-12-01: 10 mg via INTRAVENOUS
  Filled 2015-12-01: qty 1

## 2015-12-01 MED ORDER — SODIUM CHLORIDE 0.9 % IV SOLN
Freq: Once | INTRAVENOUS | Status: AC
  Administered 2015-12-01: 11:00:00 via INTRAVENOUS

## 2015-12-01 MED ORDER — SODIUM CHLORIDE 0.9 % IV SOLN
80.0000 mg/m2 | Freq: Once | INTRAVENOUS | Status: AC
  Administered 2015-12-01: 150 mg via INTRAVENOUS
  Filled 2015-12-01: qty 25

## 2015-12-01 MED ORDER — SODIUM CHLORIDE 0.9% FLUSH
10.0000 mL | INTRAVENOUS | Status: DC | PRN
  Administered 2015-12-01: 10 mL via INTRAVENOUS
  Filled 2015-12-01: qty 10

## 2015-12-01 MED ORDER — DIPHENHYDRAMINE HCL 50 MG/ML IJ SOLN
25.0000 mg | Freq: Once | INTRAMUSCULAR | Status: AC
  Administered 2015-12-01: 25 mg via INTRAVENOUS

## 2015-12-01 MED ORDER — DIPHENHYDRAMINE HCL 50 MG/ML IJ SOLN
INTRAMUSCULAR | Status: AC
  Filled 2015-12-01: qty 1

## 2015-12-01 MED ORDER — HEPARIN SOD (PORK) LOCK FLUSH 100 UNIT/ML IV SOLN
500.0000 [IU] | Freq: Once | INTRAVENOUS | Status: AC | PRN
  Administered 2015-12-01: 500 [IU]
  Filled 2015-12-01: qty 5

## 2015-12-01 MED ORDER — FAMOTIDINE IN NACL 20-0.9 MG/50ML-% IV SOLN
20.0000 mg | Freq: Once | INTRAVENOUS | Status: AC
  Administered 2015-12-01: 20 mg via INTRAVENOUS

## 2015-12-01 MED ORDER — SODIUM CHLORIDE 0.9% FLUSH
10.0000 mL | INTRAVENOUS | Status: DC | PRN
  Administered 2015-12-01: 10 mL
  Filled 2015-12-01: qty 10

## 2015-12-01 MED ORDER — FAMOTIDINE IN NACL 20-0.9 MG/50ML-% IV SOLN
INTRAVENOUS | Status: AC
  Filled 2015-12-01: qty 50

## 2015-12-01 NOTE — Telephone Encounter (Signed)
Gave and printed appt sched and avs for pt for June °

## 2015-12-01 NOTE — Progress Notes (Signed)
[facility] OFFICE PROGRESS NOTE   Diagnosis: Ovarian cancer  INTERVAL HISTORY:   She returns as scheduled. She was last treated with Taxol on 11/24/2015. She feels well. She has occasional low abdominal pain. Her bowels are moving. She continues a low residual diet. She would like to reintroduce some vegetables. No change in neuropathy symptoms that preexisted salvage Taxol. She completed a Live strong course.  Objective:  Vital signs in last 24 hours:  Blood pressure 141/64, pulse 88, temperature 98.4 F (36.9 C), temperature source Oral, resp. rate 19, height 5\' 1"  (1.549 m), weight 182 lb 3.2 oz (82.645 kg), SpO2 97 %.    HEENT: No thrush or ulcers Resp: Lungs clear bilaterally Cardio: Regular rate and rhythm GI: No hepatomegaly, no mass, no apparent ascites, mild tenderness in the low abdomen bilaterally Vascular: No leg edema  Portacath/PICC-without erythema  Lab Results:  Lab Results  Component Value Date   WBC 2.7* 12/01/2015   HGB 10.7* 12/01/2015   HCT 32.4* 12/01/2015   MCV 93.8 12/01/2015   PLT 241 12/01/2015   NEUTROABS 1.6 12/01/2015   CA-125 on 11/03/2015-246.5   Medications: I have reviewed the patient's current medications.  Assessment/Plan: 1. 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 Significant improvement in malignant right pleural effusion.  Status post robotic-assisted laparoscopic hysterectomy with bilateral salpingoophorectomy, omentectomy, radical tumor debulking 12/29/2014. Per Dr. Serita Grit office note 01/14/2015 cytoreduction was optimal with residual disease remaining only in the 1 mm implants on the small intestine. 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  Cycle 1 adjuvant Taxol/carboplatin 01/20/2015  Cycle 2 adjuvant Taxol/carboplatin 02/10/2015  Cycle 3 adjuvant Taxol/carboplatin 03/10/2015  CA 125 on 1013 2016-42  CTs of the chest, abdomen, and pelvis 05/31/2015 with no evidence of progressive ovarian cancer  CTs of the chest, abdomen, and pelvis 08/07/2015 and 08/08/2015-no evidence of progressive ovarian cancer  Peritoneal studding noted at the time of the cholecystectomy procedure 08/09/2015  Elevated CA 125 10/07/2015  CT 10/07/2015 with stricturing at the sigmoid colon, constipation, and omental nodularity  Initiation of salvage weekly Taxol chemotherapy 10/13/2015 2. COPD 3. Dyspnea secondary to COPD and the large right pleural effusion, status post therapeutic thoracentesis procedures 09/30/2014,10/09/2014, and 10/21/2014. Left thoracentesis 10/30/2014 4. Left nephrectomy as a child 5. Delayed nausea following cycle 1 Taxol/carboplatin, Aloxi/Emend added with cycle 2 with improvement. 6. Right lower extremity edema, right calf pain 12/09/2014. Negative venous Doppler 12/09/2014. 7. Diffuse pruritus following cycle 1 adjuvant Taxol/carboplatin 01/20/2015, no  rash, resolved with a steroid dose pack 8. Thrombocytopenia second to chemotherapy, the carboplatin was dose reduced with cycle 2 adjuvant Taxol/carboplatin 02/10/2015 9. Chemotherapy-induced peripheral neuropathy-improved 10. Admission with acute cholecystitis 08/07/2015, status post a cholecystectomy 08/09/2015 11.  Admission 10/08/2015 with abdominal pain/constipation, improved with bowel rest and laxatives 12. Mild right hydronephrosis noted on the CT 10/07/2015   Disposition:  [name] appears stable. She is tolerating the Taxol well. The CA-125 was slightly lower on 11/03/2015. We will follow-up on the CA-125 from today. She will complete another treatment with Taxol today. She plans a vacation next week. She will be scheduled for the next cycle of chemotherapy on 12/15/2015.  She has a solitary kidney and mild right hydronephrosis was noted on a CT 10/07/2015. The creatinine has remained stable. She will be referred for a renal ultrasound.  Betsy Coder, MD  12/01/2015  10:26 AM

## 2015-12-01 NOTE — Patient Instructions (Signed)
Paclitaxel injection What is this medicine? PACLITAXEL (PAK li TAX el) is a chemotherapy drug. It targets fast dividing cells, like cancer cells, and causes these cells to die. This medicine is used to treat ovarian cancer, breast cancer, and other cancers. This medicine may be used for other purposes; ask your health care provider or pharmacist if you have questions. What should I tell my health care provider before I take this medicine? They need to know if you have any of these conditions: -blood disorders -irregular heartbeat -infection (especially a virus infection such as chickenpox, cold sores, or herpes) -liver disease -previous or ongoing radiation therapy -an unusual or allergic reaction to paclitaxel, alcohol, polyoxyethylated castor oil, other chemotherapy agents, other medicines, foods, dyes, or preservatives -pregnant or trying to get pregnant -breast-feeding How should I use this medicine? This drug is given as an infusion into a vein. It is administered in a hospital or clinic by a specially trained health care professional. Talk to your pediatrician regarding the use of this medicine in children. Special care may be needed. Overdosage: If you think you have taken too much of this medicine contact a poison control center or emergency room at once. NOTE: This medicine is only for you. Do not share this medicine with others. What if I miss a dose? It is important not to miss your dose. Call your doctor or health care professional if you are unable to keep an appointment. What may interact with this medicine? Do not take this medicine with any of the following medications: -disulfiram -metronidazole This medicine may also interact with the following medications: -cyclosporine -diazepam -ketoconazole -medicines to increase blood counts like filgrastim, pegfilgrastim, sargramostim -other chemotherapy drugs like cisplatin, doxorubicin, epirubicin, etoposide, teniposide,  vincristine -quinidine -testosterone -vaccines -verapamil Talk to your doctor or health care professional before taking any of these medicines: -acetaminophen -aspirin -ibuprofen -ketoprofen -naproxen This list may not describe all possible interactions. Give your health care provider a list of all the medicines, herbs, non-prescription drugs, or dietary supplements you use. Also tell them if you smoke, drink alcohol, or use illegal drugs. Some items may interact with your medicine. What should I watch for while using this medicine? Your condition will be monitored carefully while you are receiving this medicine. You will need important blood work done while you are taking this medicine. This drug may make you feel generally unwell. This is not uncommon, as chemotherapy can affect healthy cells as well as cancer cells. Report any side effects. Continue your course of treatment even though you feel ill unless your doctor tells you to stop. This medicine can cause serious allergic reactions. To reduce your risk you will need to take other medicine(s) before treatment with this medicine. In some cases, you may be given additional medicines to help with side effects. Follow all directions for their use. Call your doctor or health care professional for advice if you get a fever, chills or sore throat, or other symptoms of a cold or flu. Do not treat yourself. This drug decreases your body's ability to fight infections. Try to avoid being around people who are sick. This medicine may increase your risk to bruise or bleed. Call your doctor or health care professional if you notice any unusual bleeding. Be careful brushing and flossing your teeth or using a toothpick because you may get an infection or bleed more easily. If you have any dental work done, tell your dentist you are receiving this medicine. Avoid taking products that   contain aspirin, acetaminophen, ibuprofen, naproxen, or ketoprofen unless  instructed by your doctor. These medicines may hide a fever. Do not become pregnant while taking this medicine. Women should inform their doctor if they wish to become pregnant or think they might be pregnant. There is a potential for serious side effects to an unborn child. Talk to your health care professional or pharmacist for more information. Do not breast-feed an infant while taking this medicine. Men are advised not to father a child while receiving this medicine. This product may contain alcohol. Ask your pharmacist or healthcare provider if this medicine contains alcohol. Be sure to tell all healthcare providers you are taking this medicine. Certain medicines, like metronidazole and disulfiram, can cause an unpleasant reaction when taken with alcohol. The reaction includes flushing, headache, nausea, vomiting, sweating, and increased thirst. The reaction can last from 30 minutes to several hours. What side effects may I notice from receiving this medicine? Side effects that you should report to your doctor or health care professional as soon as possible: -allergic reactions like skin rash, itching or hives, swelling of the face, lips, or tongue -low blood counts - This drug may decrease the number of white blood cells, red blood cells and platelets. You may be at increased risk for infections and bleeding. -signs of infection - fever or chills, cough, sore throat, pain or difficulty passing urine -signs of decreased platelets or bleeding - bruising, pinpoint red spots on the skin, black, tarry stools, nosebleeds -signs of decreased red blood cells - unusually weak or tired, fainting spells, lightheadedness -breathing problems -chest pain -high or low blood pressure -mouth sores -nausea and vomiting -pain, swelling, redness or irritation at the injection site -pain, tingling, numbness in the hands or feet -slow or irregular heartbeat -swelling of the ankle, feet, hands Side effects that  usually do not require medical attention (report to your doctor or health care professional if they continue or are bothersome): -bone pain -complete hair loss including hair on your head, underarms, pubic hair, eyebrows, and eyelashes -changes in the color of fingernails -diarrhea -loosening of the fingernails -loss of appetite -muscle or joint pain -red flush to skin -sweating This list may not describe all possible side effects. Call your doctor for medical advice about side effects. You may report side effects to FDA at 1-800-FDA-1088. Where should I keep my medicine? This drug is given in a hospital or clinic and will not be stored at home. NOTE: This sheet is a summary. It may not cover all possible information. If you have questions about this medicine, talk to your doctor, pharmacist, or health care provider.    2016, [employer]. (2015-02-11 13:02:56)  

## 2015-12-02 ENCOUNTER — Telehealth: Payer: Self-pay | Admitting: Oncology

## 2015-12-02 ENCOUNTER — Telehealth: Payer: Self-pay | Admitting: *Deleted

## 2015-12-02 LAB — CA 125: CANCER ANTIGEN (CA) 125: 60.9 U/mL — AB (ref 0.0–38.1)

## 2015-12-02 NOTE — Telephone Encounter (Signed)
s.w. pt and advise don 6.7 appt.....pt ok and aware °

## 2015-12-02 NOTE — Telephone Encounter (Signed)
Per staff message and POF I have scheduled appts. Advised scheduler of appts and to move appts. JMW  

## 2015-12-03 ENCOUNTER — Telehealth: Payer: Self-pay | Admitting: Cardiology

## 2015-12-03 DIAGNOSIS — E785 Hyperlipidemia, unspecified: Secondary | ICD-10-CM

## 2015-12-03 DIAGNOSIS — E538 Deficiency of other specified B group vitamins: Secondary | ICD-10-CM | POA: Diagnosis not present

## 2015-12-03 DIAGNOSIS — Z79899 Other long term (current) drug therapy: Secondary | ICD-10-CM

## 2015-12-03 NOTE — Telephone Encounter (Signed)
That's ok.

## 2015-12-03 NOTE — Telephone Encounter (Signed)
New MEssage  Pt calling to speak w/ RN- wanted to discuss lab work that was taken @ Cancer center last week. Please call back and discuss.

## 2015-12-03 NOTE — Telephone Encounter (Signed)
She has normal electrolytes, liver and kidney function. Hb 10.7 that is mildly reduced, otherwise normal labs. Her oncologist has to comment on CA 125. KN

## 2015-12-03 NOTE — Telephone Encounter (Signed)
Spoke with pt and she states that she never heard about her labs from 5/17. Pt states [facility] center was to draw the labs and they were to be sent to Dr. Meda Coffee. Advised pt that labs are still showing a status of "needs to be drawn" in our system. Pt was to have lipids, LFT and BMET.  CMET has been and results available but no lipids. Pt does not go back to [facility] center until 6/7.  Advised pt that I would send this message to Dr. Meda Coffee and see if ok to wait until 6/7 for lipid panel or if she needed it sooner. Pt verbalized understanding.

## 2015-12-03 NOTE — Telephone Encounter (Signed)
Is it ok for her to wait until 6/7 to have lipids drawn?  They did not draw them on the 17th.

## 2015-12-03 NOTE — Telephone Encounter (Signed)
Pt aware okay to get done on 6/7, new orders put in system and she going to let them know to draw at same time for Dr. Meda Coffee

## 2015-12-08 NOTE — Addendum Note (Signed)
Addended by: Nuala Alpha on: 12/08/2015 09:01 AM   Modules accepted: Orders

## 2015-12-08 NOTE — Telephone Encounter (Signed)
Appropriate lab entry entered in the system for the pt to have her lipids checked at the cancer center on 12/15/15.  Will link this order to the pts lab appt at the cancer center on 12/15/15.  Pt aware to come fasting to her lab appt on 6/7 at the cancer center.

## 2015-12-12 ENCOUNTER — Other Ambulatory Visit: Payer: Self-pay | Admitting: Oncology

## 2015-12-15 ENCOUNTER — Ambulatory Visit (HOSPITAL_COMMUNITY)
Admission: RE | Admit: 2015-12-15 | Discharge: 2015-12-15 | Disposition: A | Discharge status: Home / Self Care | Payer: Medicare Other | Source: Ambulatory Visit | Attending: Nurse Practitioner | Admitting: Nurse Practitioner

## 2015-12-15 ENCOUNTER — Ambulatory Visit (HOSPITAL_BASED_OUTPATIENT_CLINIC_OR_DEPARTMENT_OTHER): Payer: Medicare Other | Admitting: Nurse Practitioner

## 2015-12-15 ENCOUNTER — Other Ambulatory Visit (HOSPITAL_BASED_OUTPATIENT_CLINIC_OR_DEPARTMENT_OTHER): Payer: Medicare Other

## 2015-12-15 ENCOUNTER — Other Ambulatory Visit: Payer: Medicare Other

## 2015-12-15 ENCOUNTER — Ambulatory Visit (HOSPITAL_BASED_OUTPATIENT_CLINIC_OR_DEPARTMENT_OTHER): Payer: Medicare Other

## 2015-12-15 ENCOUNTER — Ambulatory Visit: Payer: Medicare Other

## 2015-12-15 VITALS — BP 143/70 | HR 78 | Temp 98.6°F | Resp 20

## 2015-12-15 DIAGNOSIS — C561 Malignant neoplasm of right ovary: Secondary | ICD-10-CM | POA: Insufficient documentation

## 2015-12-15 DIAGNOSIS — W57XXXA Bitten or stung by nonvenomous insect and other nonvenomous arthropods, initial encounter: Secondary | ICD-10-CM

## 2015-12-15 DIAGNOSIS — R918 Other nonspecific abnormal finding of lung field: Secondary | ICD-10-CM | POA: Diagnosis not present

## 2015-12-15 DIAGNOSIS — R0600 Dyspnea, unspecified: Secondary | ICD-10-CM | POA: Insufficient documentation

## 2015-12-15 DIAGNOSIS — C482 Malignant neoplasm of peritoneum, unspecified: Secondary | ICD-10-CM | POA: Diagnosis not present

## 2015-12-15 DIAGNOSIS — N1339 Other hydronephrosis: Secondary | ICD-10-CM

## 2015-12-15 DIAGNOSIS — Z79899 Other long term (current) drug therapy: Secondary | ICD-10-CM | POA: Diagnosis not present

## 2015-12-15 DIAGNOSIS — Z452 Encounter for adjustment and management of vascular access device: Secondary | ICD-10-CM

## 2015-12-15 DIAGNOSIS — Z95828 Presence of other vascular implants and grafts: Secondary | ICD-10-CM

## 2015-12-15 DIAGNOSIS — C569 Malignant neoplasm of unspecified ovary: Secondary | ICD-10-CM

## 2015-12-15 DIAGNOSIS — E785 Hyperlipidemia, unspecified: Secondary | ICD-10-CM

## 2015-12-15 DIAGNOSIS — J449 Chronic obstructive pulmonary disease, unspecified: Secondary | ICD-10-CM | POA: Insufficient documentation

## 2015-12-15 DIAGNOSIS — Z5111 Encounter for antineoplastic chemotherapy: Secondary | ICD-10-CM | POA: Diagnosis not present

## 2015-12-15 LAB — CBC WITH DIFFERENTIAL/PLATELET
BASO%: 0.8 % (ref 0.0–2.0)
BASOS ABS: 0 10*3/uL (ref 0.0–0.1)
EOS ABS: 0 10*3/uL (ref 0.0–0.5)
EOS%: 1 % (ref 0.0–7.0)
HEMATOCRIT: 34.5 % — AB (ref 34.8–46.6)
HEMOGLOBIN: 11.1 g/dL — AB (ref 11.6–15.9)
LYMPH#: 0.9 10*3/uL (ref 0.9–3.3)
LYMPH%: 23.9 % (ref 14.0–49.7)
MCH: 31 pg (ref 25.1–34.0)
MCHC: 32.2 g/dL (ref 31.5–36.0)
MCV: 96.3 fL (ref 79.5–101.0)
MONO#: 0.4 10*3/uL (ref 0.1–0.9)
MONO%: 10.3 % (ref 0.0–14.0)
NEUT#: 2.4 10*3/uL (ref 1.5–6.5)
NEUT%: 64 % (ref 38.4–76.8)
Platelets: 277 10*3/uL (ref 145–400)
RBC: 3.58 10*6/uL — ABNORMAL LOW (ref 3.70–5.45)
RDW: 19.9 % — AB (ref 11.2–14.5)
WBC: 3.8 10*3/uL — ABNORMAL LOW (ref 3.9–10.3)

## 2015-12-15 LAB — COMPREHENSIVE METABOLIC PANEL
ALBUMIN: 3.5 g/dL (ref 3.5–5.0)
ALK PHOS: 71 U/L (ref 40–150)
ALT: 13 U/L (ref 0–55)
ANION GAP: 7 meq/L (ref 3–11)
AST: 13 U/L (ref 5–34)
BUN: 15.9 mg/dL (ref 7.0–26.0)
CALCIUM: 10 mg/dL (ref 8.4–10.4)
CO2: 29 mEq/L (ref 22–29)
Chloride: 106 mEq/L (ref 98–109)
Creatinine: 1 mg/dL (ref 0.6–1.1)
EGFR: 57 mL/min/{1.73_m2} — AB (ref >90)
Glucose: 99 mg/dl (ref 70–140)
POTASSIUM: 4.3 meq/L (ref 3.5–5.1)
Sodium: 142 mEq/L (ref 136–145)
Total Bilirubin: 0.36 mg/dL (ref 0.20–1.20)
Total Protein: 6.7 g/dL (ref 6.4–8.3)

## 2015-12-15 MED ORDER — DEXAMETHASONE SODIUM PHOSPHATE 100 MG/10ML IJ SOLN
10.0000 mg | Freq: Once | INTRAMUSCULAR | Status: AC
  Administered 2015-12-15: 10 mg via INTRAVENOUS
  Filled 2015-12-15: qty 1

## 2015-12-15 MED ORDER — ALTEPLASE 2 MG IJ SOLR
2.0000 mg | Freq: Once | INTRAMUSCULAR | Status: AC | PRN
  Administered 2015-12-15: 2 mg
  Filled 2015-12-15: qty 2

## 2015-12-15 MED ORDER — SODIUM CHLORIDE 0.9 % IV SOLN
80.0000 mg/m2 | Freq: Once | INTRAVENOUS | Status: AC
  Administered 2015-12-15: 150 mg via INTRAVENOUS
  Filled 2015-12-15: qty 25

## 2015-12-15 MED ORDER — FAMOTIDINE IN NACL 20-0.9 MG/50ML-% IV SOLN
INTRAVENOUS | Status: AC
  Filled 2015-12-15: qty 50

## 2015-12-15 MED ORDER — SODIUM CHLORIDE 0.9 % IV SOLN
Freq: Once | INTRAVENOUS | Status: AC
  Administered 2015-12-15: 14:00:00 via INTRAVENOUS

## 2015-12-15 MED ORDER — DIPHENHYDRAMINE HCL 50 MG/ML IJ SOLN
25.0000 mg | Freq: Once | INTRAMUSCULAR | Status: AC
Start: 2015-12-15 — End: 2015-12-15
  Administered 2015-12-15: 25 mg via INTRAVENOUS

## 2015-12-15 MED ORDER — SODIUM CHLORIDE 0.9 % IJ SOLN
10.0000 mL | INTRAMUSCULAR | Status: DC | PRN
Start: 2015-12-15 — End: 2015-12-15
  Administered 2015-12-15: 10 mL via INTRAVENOUS
  Filled 2015-12-15: qty 10

## 2015-12-15 MED ORDER — SODIUM CHLORIDE 0.9% FLUSH
10.0000 mL | INTRAVENOUS | Status: DC | PRN
Start: 2015-12-15 — End: 2015-12-15
  Administered 2015-12-15: 10 mL
  Filled 2015-12-15: qty 10

## 2015-12-15 MED ORDER — HEPARIN SOD (PORK) LOCK FLUSH 100 UNIT/ML IV SOLN
500.0000 [IU] | Freq: Once | INTRAVENOUS | Status: AC | PRN
  Administered 2015-12-15: 500 [IU]
  Filled 2015-12-15: qty 5

## 2015-12-15 MED ORDER — FAMOTIDINE IN NACL 20-0.9 MG/50ML-% IV SOLN
20.0000 mg | Freq: Once | INTRAVENOUS | Status: AC
  Administered 2015-12-15: 20 mg via INTRAVENOUS

## 2015-12-15 MED ORDER — DIPHENHYDRAMINE HCL 50 MG/ML IJ SOLN
INTRAMUSCULAR | Status: AC
  Filled 2015-12-15: qty 1

## 2015-12-15 NOTE — Progress Notes (Signed)
Right antecubital -pt reports 'bug bite' , one week ago in a motel room, it itches and was warm to touch . She reports it looks better now , has  a red area 1" x 3/4" in diameter-not warm to touch.

## 2015-12-15 NOTE — Patient Instructions (Signed)

## 2015-12-15 NOTE — Patient Instructions (Addendum)
[facility] Discharge Instructions for Patients Receiving Chemotherapy  Today you received the following chemotherapy agents Taxol  To help prevent nausea and vomiting after your treatment, we encourage you to take your nausea medication    If you develop nausea and vomiting that is not controlled by your nausea medication, call the clinic.   BELOW ARE SYMPTOMS THAT SHOULD BE REPORTED IMMEDIATELY:  *FEVER GREATER THAN 100.5 F  *CHILLS WITH OR WITHOUT FEVER  NAUSEA AND VOMITING THAT IS NOT CONTROLLED WITH YOUR NAUSEA MEDICATION  *UNUSUAL SHORTNESS OF BREATH  *UNUSUAL BRUISING OR BLEEDING  TENDERNESS IN MOUTH AND THROAT WITH OR WITHOUT PRESENCE OF ULCERS  *URINARY PROBLEMS  *BOWEL PROBLEMS  UNUSUAL RASH Items with * indicate a potential emergency and should be followed up as soon as possible.  Feel free to call the clinic you have any questions or concerns. The clinic phone number is [phone].  Please show the [place] at check-in to the Emergency Department and triage nurse   Go to radiology today for chest Xray. Office will call with result. Report to ED for chest pain or worsening dyspnea.

## 2015-12-15 NOTE — Progress Notes (Signed)
At completion of Taxol infusion pt reported exertional dyspnea is not resolving with rest today as it usually does. Pt has COPD. Dyspnea started while ambulating to restroom x 2. She reports she used rescue inhaler with little relief. Pt short of breath with speaking. Drue Second, NP notified.  APP in to see pt. Order received for chest Xray. OK to discharge home. Will call pt with chest Xray result.  Pt transported to radiology via wheelchair with husband. She understands to go to ED for worsening dyspnea or chest pain.

## 2015-12-16 ENCOUNTER — Encounter: Payer: Self-pay | Admitting: Nurse Practitioner

## 2015-12-16 ENCOUNTER — Telehealth: Payer: Self-pay

## 2015-12-16 ENCOUNTER — Ambulatory Visit (HOSPITAL_COMMUNITY)
Admission: RE | Admit: 2015-12-16 | Discharge: 2015-12-16 | Disposition: A | Discharge status: Home / Self Care | Payer: Medicare Other | Source: Ambulatory Visit | Attending: Oncology | Admitting: Oncology

## 2015-12-16 DIAGNOSIS — N1339 Other hydronephrosis: Secondary | ICD-10-CM | POA: Diagnosis not present

## 2015-12-16 DIAGNOSIS — C561 Malignant neoplasm of right ovary: Secondary | ICD-10-CM | POA: Diagnosis not present

## 2015-12-16 DIAGNOSIS — R0600 Dyspnea, unspecified: Secondary | ICD-10-CM | POA: Diagnosis not present

## 2015-12-16 DIAGNOSIS — J449 Chronic obstructive pulmonary disease, unspecified: Secondary | ICD-10-CM | POA: Diagnosis not present

## 2015-12-16 DIAGNOSIS — R918 Other nonspecific abnormal finding of lung field: Secondary | ICD-10-CM | POA: Diagnosis not present

## 2015-12-16 DIAGNOSIS — N1330 Unspecified hydronephrosis: Secondary | ICD-10-CM | POA: Diagnosis not present

## 2015-12-16 DIAGNOSIS — W57XXXA Bitten or stung by nonvenomous insect and other nonvenomous arthropods, initial encounter: Secondary | ICD-10-CM | POA: Insufficient documentation

## 2015-12-16 NOTE — Telephone Encounter (Signed)
Called to follow up with patient SOB. States she went home after chemo yesterday and took her evening Symbicort and layed down, states she felt much better and was able to get up and do a couple things around the house. Informed patient there were no new abnormalities on chest x-ray. Patient verbalized understanding and knows to call with any worsening SOB or abnormalities.

## 2015-12-16 NOTE — Telephone Encounter (Signed)
-----   Message from [name], NP sent at 12/16/2015  9:12 AM EDT ----- Please call her this morning to follow-up on the shortness of breath. Thanks

## 2015-12-17 ENCOUNTER — Encounter: Payer: Self-pay | Admitting: Nurse Practitioner

## 2015-12-17 ENCOUNTER — Telehealth: Payer: Self-pay | Admitting: *Deleted

## 2015-12-17 ENCOUNTER — Telehealth: Payer: Self-pay | Admitting: Nurse Practitioner

## 2015-12-17 ENCOUNTER — Telehealth: Payer: Self-pay | Admitting: Emergency Medicine

## 2015-12-17 NOTE — Progress Notes (Signed)
SYMPTOM MANAGEMENT CLINIC    Chief Complaint: Insect bite and shortness of breath  HPI:  Lynn Ramirez 75 y.o. female diagnosed with ovarian cancer/peritoneal carcinomatosis.  Currently undergoing Taxol chemotherapy regimen.  Patient presented to the [facility] today to receive her next cycle of chemotherapy.  She reports an insect bite to right forearm; and later complained of some increased shortness of breath.  What she had been up walking and going to the bathroom.    No history exists.    Review of Systems  Respiratory: Positive for shortness of breath.   Skin:       Right forearm insect bite.  All other systems reviewed and are negative.   Past Medical History  Diagnosis Date  . H/O hydronephrosis   . Hypertension   . Emphysema   . COPD (chronic obstructive pulmonary disease) ([place])   . Cancer ([place])     ovarian  . Eczema     hands  . GERD (gastroesophageal reflux disease)   . History of transfusion     age 35  . Difficulty sleeping     Past Surgical History  Procedure Laterality Date  . Nephrectomy    . Tubal ligation    . Hand surgery Right 1980's  . Thoracentesis      several  . Robotic assisted total hysterectomy with bilateral salpingo oopherectomy Bilateral 12/29/2014    Procedure: ROBOTIC ASSISTED TOTAL LAPAROSCOPIC HYSTERECTOMY WITH BILATERAL SALPINGO OOPHORECTOMY AND OOMENTECTOMY WITH RADICAL TUMOR [place] ;  Surgeon: Everitt Amber, MD;  Location: [facility];  Service: Gynecology;  Laterality: Bilateral;  . Laparotomy N/A 12/29/2014    Procedure:  LAPAROTOMY;  Surgeon: Everitt Amber, MD;  Location: [facility];  Service: Gynecology;  Laterality: N/A;  . Tonsillectomy    . Cholecystectomy N/A 08/09/2015    Procedure: Attempted LAPAROSCOPIC coverted  open CHOLECYSTECTOMY WITH INTRAOPERATIVE CHOLANGIOGRAM;  Surgeon: Autumn Messing III, MD;  Location: [facility];  Service: General;  Laterality: N/A;    has COPD  GOLD III; Encopresis; Pleural effusion; Pelvic mass in  female; Insomnia; Metastatic adenocarcinoma ([place]); Primary peritoneal carcinomatosis ([place]); Ascites; Adenocarcinoma ([place]); Ovarian cancer ([place]); Genetic testing; Malignant neoplasm of fallopian tube ([place]); Family history of pancreatic cancer; Family history of breast cancer in female; Chemotherapy-induced neuropathy ([place]); CAD (coronary artery disease); Hyperlipidemia; Claudication ([place]); Dyspnea; Chronic respiratory failure with hypoxia ([place]); Cholecystitis; Acute cholecystitis; Solitary pulmonary nodule on lung CT; Acute calculous cholecystitis; Abnormal CXR; COPD (chronic obstructive pulmonary disease) ([place]); Obstipation; Constipation; Sigmoid stricture ([place]); Port catheter in place; Allergic rhinitis; and Insect bite on her problem list.    is allergic to latex and penicillins.    Medication List       This list is accurate as of: 12/15/15 11:59 PM.  Always use your most recent med list.               albuterol 108 (90 Base) MCG/ACT inhaler  Commonly known as:  PROAIR HFA  INHALE 2 PUFFS BY MOUTH FOUR TIMES DAILY AS NEEDED FOR WHEEZING     antiseptic oral rinse Liqd  15 mLs by Mouth Rinse route 3 (three) times daily.     atorvastatin 10 MG tablet  Commonly known as:  LIPITOR  Take 10 mg by mouth daily.     Biotin 2500 MCG Caps  Take 1 tablet by mouth daily.     budesonide-formoterol 160-4.5 MCG/ACT inhaler  Commonly known as:  SYMBICORT  Inhale 2 puffs into the lungs 2 (two) times daily.  docusate sodium 100 MG capsule  Commonly known as:  COLACE  Take 1 capsule (100 mg total) by mouth 2 (two) times daily.     famotidine 10 MG tablet  Commonly known as:  PEPCID  Take 20 mg by mouth daily.     fluocinonide cream 0.05 %  Commonly known as:  LIDEX  Apply 1 application topically 2 (two) times daily as needed (For ezcema).     lidocaine-prilocaine cream  Commonly known as:  EMLA  Apply to port site one hour prior to use. Do not rub in. Cover with plastic.     OMEGA-3  PO  Take 1 capsule by mouth daily.     ondansetron 4 MG disintegrating tablet  Commonly known as:  ZOFRAN-ODT  Take 1 tablet (4 mg total) by mouth daily as needed for nausea or vomiting.     oxyCODONE 5 MG immediate release tablet  Commonly known as:  Oxy IR/ROXICODONE  Take 1 tablet by mouth every 4 (four) hours as needed for severe pain.     polyethylene glycol packet  Commonly known as:  MIRALAX / GLYCOLAX  Take 17 g by mouth daily.     senna 8.6 MG Tabs tablet  Commonly known as:  SENOKOT  Take 2 tablets (17.2 mg total) by mouth daily.     triamterene-hydrochlorothiazide 37.5-25 MG tablet  Commonly known as:  MAXZIDE-25  Take 0.5 tablets by mouth daily.     TYLENOL 325 MG Caps  Generic drug:  Acetaminophen  Take 1 tablet by mouth 3 (three) times daily as needed (pain).     vitamin B-12 100 MCG tablet  Commonly known as:  CYANOCOBALAMIN  Take 100 mcg by mouth daily.     B-12 IJ  Inject 1 each as directed every 30 (thirty) days. Reported on 10/07/2015     zolpidem 5 MG tablet  Commonly known as:  AMBIEN  Take 1 tablet (5 mg total) by mouth at bedtime as needed for sleep.         PHYSICAL EXAMINATION  Oncology Vitals 12/15/2015 12/15/2015  Height - -  Weight - -  Weight (lbs) - -  BMI (kg/m2) - -  Temp - 98.6  Pulse 78 76  Resp - 20  SpO2 95 98  BSA (m2) - -   BP Readings from Last 2 Encounters:  12/15/15 143/70  12/01/15 141/64    Physical Exam  Constitutional: She is oriented to person, place, and time and well-developed, well-nourished, and in no distress.  HENT:  Head: Normocephalic and atraumatic.  Mouth/Throat: Oropharynx is clear and moist.  Eyes: Conjunctivae and EOM are normal. Pupils are equal, round, and reactive to light. Right eye exhibits no discharge. Left eye exhibits no discharge. No scleral icterus.  Neck: Normal range of motion. Neck supple. No JVD present. No tracheal deviation present. No thyromegaly present.  Cardiovascular: Normal  rate, regular rhythm, normal heart sounds and intact distal pulses.   Pulmonary/Chest: Effort normal and breath sounds normal. No respiratory distress. She has no wheezes. She has no rales. She exhibits no tenderness.  Mild shortness of breath.  Later this afternoon on exam.  Abdominal: Soft. Bowel sounds are normal. She exhibits no distension and no mass. There is no tenderness. There is no rebound and no guarding.  Musculoskeletal: Normal range of motion. She exhibits no edema or tenderness.  Lymphadenopathy:    She has no cervical adenopathy.  Neurological: She is alert and oriented to person, place, and time.  Gait normal.  Skin: Skin is warm and dry. No rash noted. No erythema. No pallor.  Patient has what appears to be a resolving insect bite to the right forearm with no cellulitis.  Psychiatric: Affect normal.    LABORATORY DATA:. Appointment on 12/15/2015  Component Date Value Ref Range Status  . WBC 12/15/2015 3.8* 3.9 - 10.3 10e3/uL Final  . NEUT# 12/15/2015 2.4  1.5 - 6.5 10e3/uL Final  . HGB 12/15/2015 11.1* 11.6 - 15.9 g/dL Final  . HCT 12/15/2015 34.5* 34.8 - 46.6 % Final  . Platelets 12/15/2015 277  145 - 400 10e3/uL Final  . MCV 12/15/2015 96.3  79.5 - 101.0 fL Final  . MCH 12/15/2015 31.0  25.1 - 34.0 pg Final  . MCHC 12/15/2015 32.2  31.5 - 36.0 g/dL Final  . RBC 12/15/2015 3.58* 3.70 - 5.45 10e6/uL Final  . RDW 12/15/2015 19.9* 11.2 - 14.5 % Final  . lymph# 12/15/2015 0.9  0.9 - 3.3 10e3/uL Final  . MONO# 12/15/2015 0.4  0.1 - 0.9 10e3/uL Final  . Eosinophils Absolute 12/15/2015 0.0  0.0 - 0.5 10e3/uL Final  . Basophils Absolute 12/15/2015 0.0  0.0 - 0.1 10e3/uL Final  . NEUT% 12/15/2015 64.0  38.4 - 76.8 % Final  . LYMPH% 12/15/2015 23.9  14.0 - 49.7 % Final  . MONO% 12/15/2015 10.3  0.0 - 14.0 % Final  . EOS% 12/15/2015 1.0  0.0 - 7.0 % Final  . BASO% 12/15/2015 0.8  0.0 - 2.0 % Final  . Sodium 12/15/2015 142  136 - 145 mEq/L Final  . Potassium 12/15/2015 4.3   3.5 - 5.1 mEq/L Final  . Chloride 12/15/2015 106  98 - 109 mEq/L Final  . CO2 12/15/2015 29  22 - 29 mEq/L Final  . Glucose 12/15/2015 99  70 - 140 mg/dl Final   Glucose reference range is for nonfasting patients. Fasting glucose reference range is 70- 100.  Marland Kitchen BUN 12/15/2015 15.9  7.0 - 26.0 mg/dL Final  . Creatinine 12/15/2015 1.0  0.6 - 1.1 mg/dL Final  . Total Bilirubin 12/15/2015 0.36  0.20 - 1.20 mg/dL Final  . Alkaline Phosphatase 12/15/2015 71  40 - 150 U/L Final  . AST 12/15/2015 13  5 - 34 U/L Final  . ALT 12/15/2015 13  0 - 55 U/L Final  . Total Protein 12/15/2015 6.7  6.4 - 8.3 g/dL Final  . Albumin 12/15/2015 3.5  3.5 - 5.0 g/dL Final  . Calcium 12/15/2015 10.0  8.4 - 10.4 mg/dL Final  . Anion Gap 12/15/2015 7  3 - 11 mEq/L Final  . EGFR 12/15/2015 57* >90 ml/min/1.73 m2 Final   eGFR is calculated using the CKD-EPI Creatinine Equation (2009)   Photo of right arm insect bite:  Scan on 12/15/2015 1:59 PM by Susanne Borders, NP  RADIOGRAPHIC STUDIES: Dg Chest 2 View  12/15/2015  CLINICAL DATA:  Shortness of breath following chemotherapy for ovarian cancer, history COPD, hypertension, bronchitis, pneumonia EXAM: CHEST  2 VIEW COMPARISON:  08/08/2015 FINDINGS: RIGHT jugular Port-A-Cath stable with tip projecting over SVC. Normal heart size, mediastinal contours, and pulmonary vascularity. Atherosclerotic calcification aorta. Emphysematous and bronchitic changes consistent with COPD. Minimal chronic interstitial prominence stable. Vague density in the RIGHT mid lung on the previous exam is less prominent, on previous CT study corresponding to thickening or fluid at the RIGHT major fissure. No acute infiltrate, additional pleural effusion or pneumothorax. Significant diffuse osseous demineralization. IMPRESSION: COPD changes with slight decreased prominence of RIGHT  mid lung opacity, by prior CT representing fluid or thickening along the major fissure. No new abnormalities. Electronically  Signed   By: Lavonia Dana M.D.   On: 12/15/2015 17:16   US Renal  12/16/2015  CLINICAL DATA:  Followup hydronephrosis. EXAM: RENAL / URINARY TRACT ULTRASOUND COMPLETE COMPARISON:  CT 10/07/2015 FINDINGS: Right Kidney: Length: 14.1 cm. Improved appearance of pelviectasis compatible with previous exam no hydronephrosis. Echogenicity within normal limits. No mass or hydronephrosis visualized. Left Kidney: Absent Bladder: Appears normal for degree of bladder distention. IMPRESSION: 1. Right pelviectasis, improved from previous exam. Electronically Signed   By: Kerby Moors M.D.   On: 12/16/2015 14:31    ASSESSMENT/PLAN:    Primary peritoneal carcinomatosis ([place]) Patient received cycle 3, day 1 of her Taxol chemotherapy today.  She is scheduled for renal ultrasound on 12/16/2015.  She is scheduled for labs, flush, visit, chemotherapy on 12/22/2015.  Insect bite Patient states that she developed an apparent insect bite to her right forearm several days ago; and it was initially pruritic.  She states it is slowly resolving.  Exam today reveals an apparent resolving insect bite of some type.  There is no surrounding cellulitis.  It appears that this may very well have been either and mosquito bite or a spider bite.  Patient was advised to try Benadryl and Pepcid orally as directed.  Printed instructions regarding Benadryl and Pepcid were given to the patient.  Also, patient was advised to call/return or go directly to the emergency department for any worsening symptoms whatsoever.  Dyspnea Patient has a history of chronic dyspnea secondary to previously diagnosed chronic COPD.  She states that she became slightly increased dyspneic after walking to the bathroom this afternoon.  She tried taking 2 inhalations of her albuterol inhaler; with minimal effectiveness.  Exam today reveals patients with breast sounds clear and no wheezing.  She also has no coughing.  She does appear mild to moderately short of  breath.  Her vital signs are stable and O2 sats are 95% on room air.  Patient did appear to calm somewhat; and patient was sent to the radiology department for a chest x-ray for further evaluation.  Chest x-ray revealed no acute findings.  Patient was advised to continue taking her inhalers as directed.  She was advised to call/return or go directly to the emergency department for any worsening symptoms whatsoever.   Patient stated understanding of all instructions; and was in agreement with this plan of care. The patient knows to call the clinic with any problems, questions or concerns.   Total time spent with patient was 25 minutes;  with greater than 75 percent of that time spent in face to face counseling regarding patient's symptoms,  and coordination of care and follow up.  Disclaimer:This dictation was prepared with Dragon/digital dictation along with [employer]. Any transcriptional errors that result from this process are unintentional.  Drue Second, NP 12/17/2015

## 2015-12-17 NOTE — Assessment & Plan Note (Signed)
Patient has a history of chronic dyspnea secondary to previously diagnosed chronic COPD.  She states that she became slightly increased dyspneic after walking to the bathroom this afternoon.  She tried taking 2 inhalations of her albuterol inhaler; with minimal effectiveness.  Exam today reveals patients with breast sounds clear and no wheezing.  She also has no coughing.  She does appear mild to moderately short of breath.  Her vital signs are stable and O2 sats are 95% on room air.  Patient did appear to calm somewhat; and patient was sent to the radiology department for a chest x-ray for further evaluation.  Chest x-ray revealed no acute findings.  Patient was advised to continue taking her inhalers as directed.  She was advised to call/return or go directly to the emergency department for any worsening symptoms whatsoever.

## 2015-12-17 NOTE — Assessment & Plan Note (Signed)
Patient states that she developed an apparent insect bite to her right forearm several days ago; and it was initially pruritic.  She states it is slowly resolving.  Exam today reveals an apparent resolving insect bite of some type.  There is no surrounding cellulitis.  It appears that this may very well have been either and mosquito bite or a spider bite.  Patient was advised to try Benadryl and Pepcid orally as directed.  Printed instructions regarding Benadryl and Pepcid were given to the patient.  Also, patient was advised to call/return or go directly to the emergency department for any worsening symptoms whatsoever.

## 2015-12-17 NOTE — Telephone Encounter (Signed)
Spoke with pt, states she has been off of spiriva since 11/02/15- has noticed no difference in her breathing.  Pt states she is on chemo and will have increased fatigue for 2-3 days after her chemo treatment, but pt states her 02 has been stable and has not been using her rescue inhaler any more now than she was on her Spiriva, and would like to stay off of her spiriva.    Forwarding to [place] as FYI.

## 2015-12-17 NOTE — Telephone Encounter (Signed)
-----   Message from Ladell Pier, MD sent at 12/17/2015  8:20 AM EDT ----- Please call patient, [place] of kidney looks good

## 2015-12-17 NOTE — Telephone Encounter (Signed)
Late entry for date of 12/16/2015 at 10 AM: Called patient to check in with her.  She states that her shortness of breath has greatly improved since last night.  She states she's been using her inhalers as directed.  Patient states that she understands she will go to the emergency department for any worsening symptoms whatsoever.

## 2015-12-17 NOTE — Assessment & Plan Note (Signed)
Patient received cycle 3, day 1 of her Taxol chemotherapy today.  She is scheduled for renal ultrasound on 12/16/2015.  She is scheduled for labs, flush, visit, chemotherapy on 12/22/2015.

## 2015-12-17 NOTE — Telephone Encounter (Signed)
Called pt with ultrasound result. Looks good, per Dr. Benay Spice. She voiced understanding. Pt has not had recurrence of the breathlessness from 6/7.

## 2015-12-22 ENCOUNTER — Ambulatory Visit: Payer: Medicare Other

## 2015-12-22 ENCOUNTER — Telehealth: Payer: Self-pay | Admitting: Oncology

## 2015-12-22 ENCOUNTER — Ambulatory Visit (HOSPITAL_BASED_OUTPATIENT_CLINIC_OR_DEPARTMENT_OTHER): Payer: Medicare Other

## 2015-12-22 ENCOUNTER — Ambulatory Visit (HOSPITAL_BASED_OUTPATIENT_CLINIC_OR_DEPARTMENT_OTHER): Payer: Medicare Other | Admitting: Nurse Practitioner

## 2015-12-22 ENCOUNTER — Other Ambulatory Visit (HOSPITAL_BASED_OUTPATIENT_CLINIC_OR_DEPARTMENT_OTHER): Payer: Medicare Other

## 2015-12-22 VITALS — BP 122/79 | HR 83 | Temp 98.0°F | Resp 18 | Ht 61.0 in | Wt 185.1 lb

## 2015-12-22 DIAGNOSIS — G620 Drug-induced polyneuropathy: Secondary | ICD-10-CM

## 2015-12-22 DIAGNOSIS — Z95828 Presence of other vascular implants and grafts: Secondary | ICD-10-CM

## 2015-12-22 DIAGNOSIS — C561 Malignant neoplasm of right ovary: Secondary | ICD-10-CM

## 2015-12-22 DIAGNOSIS — N1339 Other hydronephrosis: Secondary | ICD-10-CM | POA: Diagnosis not present

## 2015-12-22 DIAGNOSIS — D6959 Other secondary thrombocytopenia: Secondary | ICD-10-CM

## 2015-12-22 DIAGNOSIS — C482 Malignant neoplasm of peritoneum, unspecified: Secondary | ICD-10-CM

## 2015-12-22 DIAGNOSIS — Z5111 Encounter for antineoplastic chemotherapy: Secondary | ICD-10-CM

## 2015-12-22 DIAGNOSIS — C5701 Malignant neoplasm of right fallopian tube: Secondary | ICD-10-CM

## 2015-12-22 DIAGNOSIS — C799 Secondary malignant neoplasm of unspecified site: Secondary | ICD-10-CM

## 2015-12-22 DIAGNOSIS — C569 Malignant neoplasm of unspecified ovary: Secondary | ICD-10-CM

## 2015-12-22 LAB — CBC WITH DIFFERENTIAL/PLATELET
BASO%: 0.4 % (ref 0.0–2.0)
BASOS ABS: 0 10*3/uL (ref 0.0–0.1)
EOS ABS: 0.1 10*3/uL (ref 0.0–0.5)
EOS%: 1.1 % (ref 0.0–7.0)
HEMATOCRIT: 33.2 % — AB (ref 34.8–46.6)
HGB: 10.6 g/dL — ABNORMAL LOW (ref 11.6–15.9)
LYMPH#: 1.3 10*3/uL (ref 0.9–3.3)
LYMPH%: 23 % (ref 14.0–49.7)
MCH: 31.4 pg (ref 25.1–34.0)
MCHC: 31.9 g/dL (ref 31.5–36.0)
MCV: 98.2 fL (ref 79.5–101.0)
MONO#: 0.3 10*3/uL (ref 0.1–0.9)
MONO%: 4.4 % (ref 0.0–14.0)
NEUT#: 4.1 10*3/uL (ref 1.5–6.5)
NEUT%: 71.1 % (ref 38.4–76.8)
PLATELETS: 269 10*3/uL (ref 145–400)
RBC: 3.38 10*6/uL — ABNORMAL LOW (ref 3.70–5.45)
RDW: 18.3 % — ABNORMAL HIGH (ref 11.2–14.5)
WBC: 5.7 10*3/uL (ref 3.9–10.3)

## 2015-12-22 LAB — COMPREHENSIVE METABOLIC PANEL
ALBUMIN: 3.5 g/dL (ref 3.5–5.0)
ALT: 18 U/L (ref 0–55)
ANION GAP: 7 meq/L (ref 3–11)
AST: 14 U/L (ref 5–34)
Alkaline Phosphatase: 72 U/L (ref 40–150)
BILIRUBIN TOTAL: 0.39 mg/dL (ref 0.20–1.20)
BUN: 29.3 mg/dL — AB (ref 7.0–26.0)
CALCIUM: 9.5 mg/dL (ref 8.4–10.4)
CO2: 30 mEq/L — ABNORMAL HIGH (ref 22–29)
CREATININE: 1 mg/dL (ref 0.6–1.1)
Chloride: 102 mEq/L (ref 98–109)
EGFR: 56 mL/min/{1.73_m2} — ABNORMAL LOW (ref >90)
Glucose: 125 mg/dl (ref 70–140)
Potassium: 4.1 mEq/L (ref 3.5–5.1)
Sodium: 139 mEq/L (ref 136–145)
TOTAL PROTEIN: 7 g/dL (ref 6.4–8.3)

## 2015-12-22 MED ORDER — DIPHENHYDRAMINE HCL 50 MG/ML IJ SOLN
25.0000 mg | Freq: Once | INTRAMUSCULAR | Status: AC
  Administered 2015-12-22: 25 mg via INTRAVENOUS

## 2015-12-22 MED ORDER — FAMOTIDINE IN NACL 20-0.9 MG/50ML-% IV SOLN
INTRAVENOUS | Status: AC
  Filled 2015-12-22: qty 50

## 2015-12-22 MED ORDER — HEPARIN SOD (PORK) LOCK FLUSH 100 UNIT/ML IV SOLN
500.0000 [IU] | Freq: Once | INTRAVENOUS | Status: AC | PRN
  Administered 2015-12-22: 500 [IU]
  Filled 2015-12-22: qty 5

## 2015-12-22 MED ORDER — SODIUM CHLORIDE 0.9 % IV SOLN
80.0000 mg/m2 | Freq: Once | INTRAVENOUS | Status: AC
  Administered 2015-12-22: 150 mg via INTRAVENOUS
  Filled 2015-12-22: qty 25

## 2015-12-22 MED ORDER — SODIUM CHLORIDE 0.9% FLUSH
10.0000 mL | INTRAVENOUS | Status: DC | PRN
  Administered 2015-12-22: 10 mL
  Filled 2015-12-22: qty 10

## 2015-12-22 MED ORDER — SODIUM CHLORIDE 0.9 % IV SOLN
10.0000 mg | Freq: Once | INTRAVENOUS | Status: AC
  Administered 2015-12-22: 10 mg via INTRAVENOUS
  Filled 2015-12-22: qty 1

## 2015-12-22 MED ORDER — FAMOTIDINE IN NACL 20-0.9 MG/50ML-% IV SOLN
20.0000 mg | Freq: Once | INTRAVENOUS | Status: AC
  Administered 2015-12-22: 20 mg via INTRAVENOUS

## 2015-12-22 MED ORDER — SODIUM CHLORIDE 0.9 % IJ SOLN
10.0000 mL | INTRAMUSCULAR | Status: DC | PRN
  Administered 2015-12-22: 10 mL via INTRAVENOUS
  Filled 2015-12-22: qty 10

## 2015-12-22 MED ORDER — SODIUM CHLORIDE 0.9 % IV SOLN
Freq: Once | INTRAVENOUS | Status: AC
  Administered 2015-12-22: 12:00:00 via INTRAVENOUS

## 2015-12-22 MED ORDER — DIPHENHYDRAMINE HCL 50 MG/ML IJ SOLN
INTRAMUSCULAR | Status: AC
  Filled 2015-12-22: qty 1

## 2015-12-22 NOTE — Telephone Encounter (Signed)
Gave and printed appt sched and avs for pt for June and July...port with labs not put on pof

## 2015-12-22 NOTE — Patient Instructions (Signed)

## 2015-12-22 NOTE — Patient Instructions (Signed)
[facility] Discharge Instructions for Patients Receiving Chemotherapy  Today you received the following chemotherapy agents Taxol   To help prevent nausea and vomiting after your treatment, we encourage you to take your nausea medication as directed.   If you develop nausea and vomiting that is not controlled by your nausea medication, call the clinic.   BELOW ARE SYMPTOMS THAT SHOULD BE REPORTED IMMEDIATELY:  *FEVER GREATER THAN 100.5 F  *CHILLS WITH OR WITHOUT FEVER  NAUSEA AND VOMITING THAT IS NOT CONTROLLED WITH YOUR NAUSEA MEDICATION  *UNUSUAL SHORTNESS OF BREATH  *UNUSUAL BRUISING OR BLEEDING  TENDERNESS IN MOUTH AND THROAT WITH OR WITHOUT PRESENCE OF ULCERS  *URINARY PROBLEMS  *BOWEL PROBLEMS  UNUSUAL RASH Items with * indicate a potential emergency and should be followed up as soon as possible.  Feel free to call the clinic you have any questions or concerns. The clinic phone number is [phone].  Please show the CHEMO ALERT CARD at check-in to the Emergency Department and triage nurse.   

## 2015-12-22 NOTE — Progress Notes (Signed)
[facility] OFFICE PROGRESS NOTE   Diagnosis:  Ovarian cancer  INTERVAL HISTORY:   Lynn Ramirez returns as scheduled. She was last treated with Taxol 12/15/2015. She experienced some shortness of breath while in the office. She was referred for a chest x-ray which was stable. She reports the dyspnea is much better, now at baseline. No nausea or vomiting. No mouth sores. Bowels moving regularly. She takes a laxative as needed. No diarrhea. No change in baseline neuropathy symptoms in the fingertips and feet.  Objective:  Vital signs in last 24 hours:  Blood pressure 122/79, pulse 83, temperature 98 F (36.7 C), temperature source Oral, resp. rate 18, height 5\' 1"  (1.549 m), weight 185 lb 1.6 oz (83.961 kg), SpO2 97 %.    HEENT: No thrush or ulcers. Resp: Lungs clear bilaterally. Cardio: Regular rate and rhythm. GI: Abdomen soft and nontender. No hepatomegaly. No mass. No apparent ascites. Vascular: No leg edema. Neuro: Vibratory sense mildly decreased over the fingertips per tuning fork exam.  Skin: No rash. Port-A-Cath without erythema.    Lab Results:  Lab Results  Component Value Date   WBC 5.7 12/22/2015   HGB 10.6* 12/22/2015   HCT 33.2* 12/22/2015   MCV 98.2 12/22/2015   PLT 269 12/22/2015   NEUTROABS 4.1 12/22/2015    Imaging:  No results found.  Medications: I have reviewed the patient's current medications.  Assessment/Plan: 1. 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, immunohistochemical profile consistent with a GYN primary, elevated CA 125  Staging CTs of the chest, abdomen, and pelvis on 10/06/2014 revealed a loculated right pleural effusion, ascites, and omental/mesenteric thickening  Cytology from peritoneal fluid 10/13/2014 revealed malignant cells consistent with metastatic adenocarcinoma  Biopsy of an omental mass on 11/02/2014 revealed invasive serous carcinoma with  psammoma bodies  Cycle 1 Taxol/carboplatin 10/28/2014  Cycle 2 Taxol/carboplatin 11/18/2014  Cycle 3 Taxol/carboplatin 12/09/2014  CT scan 12/23/2014 with interval improvement in peritoneal carcinomatosis with near-complete resolution of ascites and decreased omental nodularity. Significant improvement in malignant right pleural effusion.  Status post robotic-assisted laparoscopic hysterectomy with bilateral salpingoophorectomy, omentectomy, radical tumor debulking 12/29/2014. Per Dr. Serita Grit office note 01/14/2015 cytoreduction was optimal with residual disease remaining only in the 1 mm implants on the small intestine. 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  Cycle 1 adjuvant Taxol/carboplatin 01/20/2015  Cycle 2 adjuvant Taxol/carboplatin 02/10/2015  Cycle 3 adjuvant Taxol/carboplatin 03/10/2015  CA 125 on 1013 2016-42  CTs of the chest, abdomen, and pelvis 05/31/2015 with no evidence of progressive ovarian cancer  CTs of the chest, abdomen, and pelvis 08/07/2015 and 08/08/2015-no evidence of progressive ovarian cancer  Peritoneal studding noted at the time of the cholecystectomy procedure 08/09/2015  Elevated CA 125 10/07/2015  CT 10/07/2015 with stricturing at the sigmoid colon, constipation, and omental nodularity  Initiation of salvage weekly Taxol chemotherapy 10/13/2015 2. COPD 3. Dyspnea secondary to COPD and the large right pleural effusion, status post therapeutic thoracentesis procedures 09/30/2014,10/09/2014, and 10/21/2014. Left thoracentesis 10/30/2014 4. Left nephrectomy as a child 5. Delayed nausea following cycle 1 Taxol/carboplatin, Aloxi/Emend added with cycle 2 with  improvement. 6. Right lower extremity edema, right calf pain 12/09/2014. Negative venous Doppler 12/09/2014. 7. Diffuse pruritus following cycle 1 adjuvant Taxol/carboplatin 01/20/2015, no rash, resolved with  a steroid dose pack 8. Thrombocytopenia second to chemotherapy, the carboplatin was dose reduced with cycle 2 adjuvant Taxol/carboplatin 02/10/2015 9. Chemotherapy-induced peripheral neuropathy-improved 10. Admission with acute cholecystitis 08/07/2015, status post a cholecystectomy 08/09/2015 11. Admission 10/08/2015 with abdominal pain/constipation, improved with bowel rest and laxatives 12. Mild right hydronephrosis noted on the CT 10/07/2015. Renal ultrasound 12/16/2015 with no hydronephrosis noted.   Disposition: Lynn Ramirez appears stable. The CA-125 was improved several weeks ago. The plan is to continue weekly Taxol. She will be treated today, 12/29/2015 and 01/05/2016. She will return for a follow-up visit and treatment on 01/19/2016. She will contact the office in the interim with any problems.    Ned Card ANP/GNP-BC   12/22/2015  11:09 AM

## 2015-12-23 LAB — CA 125: Cancer Antigen (CA) 125: 46 U/mL — ABNORMAL HIGH (ref 0.0–38.1)

## 2015-12-29 ENCOUNTER — Ambulatory Visit (HOSPITAL_BASED_OUTPATIENT_CLINIC_OR_DEPARTMENT_OTHER): Payer: Medicare Other

## 2015-12-29 ENCOUNTER — Other Ambulatory Visit (HOSPITAL_BASED_OUTPATIENT_CLINIC_OR_DEPARTMENT_OTHER): Payer: Medicare Other

## 2015-12-29 VITALS — BP 143/72 | HR 75 | Temp 97.1°F | Resp 18

## 2015-12-29 DIAGNOSIS — C561 Malignant neoplasm of right ovary: Secondary | ICD-10-CM | POA: Diagnosis not present

## 2015-12-29 DIAGNOSIS — C799 Secondary malignant neoplasm of unspecified site: Secondary | ICD-10-CM

## 2015-12-29 DIAGNOSIS — C482 Malignant neoplasm of peritoneum, unspecified: Secondary | ICD-10-CM

## 2015-12-29 DIAGNOSIS — Z5111 Encounter for antineoplastic chemotherapy: Secondary | ICD-10-CM | POA: Diagnosis not present

## 2015-12-29 DIAGNOSIS — C569 Malignant neoplasm of unspecified ovary: Secondary | ICD-10-CM

## 2015-12-29 LAB — COMPREHENSIVE METABOLIC PANEL
ALT: 18 U/L (ref 0–55)
AST: 16 U/L (ref 5–34)
Albumin: 3.5 g/dL (ref 3.5–5.0)
Alkaline Phosphatase: 68 U/L (ref 40–150)
Anion Gap: 11 mEq/L (ref 3–11)
BILIRUBIN TOTAL: 0.36 mg/dL (ref 0.20–1.20)
BUN: 20.3 mg/dL (ref 7.0–26.0)
CO2: 28 meq/L (ref 22–29)
CREATININE: 1 mg/dL (ref 0.6–1.1)
Calcium: 10.1 mg/dL (ref 8.4–10.4)
Chloride: 102 mEq/L (ref 98–109)
EGFR: 56 mL/min/{1.73_m2} — ABNORMAL LOW (ref >90)
Glucose: 117 mg/dl (ref 70–140)
Potassium: 3.9 mEq/L (ref 3.5–5.1)
SODIUM: 140 meq/L (ref 136–145)
TOTAL PROTEIN: 7 g/dL (ref 6.4–8.3)

## 2015-12-29 LAB — CBC WITH DIFFERENTIAL/PLATELET
BASO%: 0.2 % (ref 0.0–2.0)
Basophils Absolute: 0 10*3/uL (ref 0.0–0.1)
EOS%: 1.9 % (ref 0.0–7.0)
Eosinophils Absolute: 0.1 10*3/uL (ref 0.0–0.5)
HCT: 33.3 % — ABNORMAL LOW (ref 34.8–46.6)
HGB: 10.9 g/dL — ABNORMAL LOW (ref 11.6–15.9)
LYMPH%: 32.9 % (ref 14.0–49.7)
MCH: 31.8 pg (ref 25.1–34.0)
MCHC: 32.8 g/dL (ref 31.5–36.0)
MCV: 97 fL (ref 79.5–101.0)
MONO#: 0.2 10*3/uL (ref 0.1–0.9)
MONO%: 6.6 % (ref 0.0–14.0)
NEUT%: 58.4 % (ref 38.4–76.8)
NEUTROS ABS: 1.7 10*3/uL (ref 1.5–6.5)
PLATELETS: 260 10*3/uL (ref 145–400)
RBC: 3.43 10*6/uL — AB (ref 3.70–5.45)
RDW: 19.5 % — ABNORMAL HIGH (ref 11.2–14.5)
WBC: 2.9 10*3/uL — AB (ref 3.9–10.3)
lymph#: 0.9 10*3/uL (ref 0.9–3.3)

## 2015-12-29 MED ORDER — SODIUM CHLORIDE 0.9% FLUSH
10.0000 mL | INTRAVENOUS | Status: DC | PRN
  Administered 2015-12-29: 10 mL
  Filled 2015-12-29: qty 10

## 2015-12-29 MED ORDER — SODIUM CHLORIDE 0.9 % IV SOLN
Freq: Once | INTRAVENOUS | Status: AC
  Administered 2015-12-29: 10:00:00 via INTRAVENOUS

## 2015-12-29 MED ORDER — SODIUM CHLORIDE 0.9 % IV SOLN
10.0000 mg | Freq: Once | INTRAVENOUS | Status: AC
  Administered 2015-12-29: 10 mg via INTRAVENOUS
  Filled 2015-12-29: qty 1

## 2015-12-29 MED ORDER — HEPARIN SOD (PORK) LOCK FLUSH 100 UNIT/ML IV SOLN
500.0000 [IU] | Freq: Once | INTRAVENOUS | Status: AC | PRN
  Administered 2015-12-29: 500 [IU]
  Filled 2015-12-29: qty 5

## 2015-12-29 MED ORDER — PACLITAXEL CHEMO INJECTION 300 MG/50ML
80.0000 mg/m2 | Freq: Once | INTRAVENOUS | Status: AC
  Administered 2015-12-29: 150 mg via INTRAVENOUS
  Filled 2015-12-29: qty 25

## 2015-12-29 MED ORDER — FAMOTIDINE IN NACL 20-0.9 MG/50ML-% IV SOLN
INTRAVENOUS | Status: AC
  Filled 2015-12-29: qty 50

## 2015-12-29 MED ORDER — FAMOTIDINE IN NACL 20-0.9 MG/50ML-% IV SOLN
20.0000 mg | Freq: Once | INTRAVENOUS | Status: AC
  Administered 2015-12-29: 20 mg via INTRAVENOUS

## 2015-12-29 MED ORDER — DIPHENHYDRAMINE HCL 50 MG/ML IJ SOLN
INTRAMUSCULAR | Status: AC
  Filled 2015-12-29: qty 1

## 2015-12-29 MED ORDER — DIPHENHYDRAMINE HCL 50 MG/ML IJ SOLN
25.0000 mg | Freq: Once | INTRAMUSCULAR | Status: AC
  Administered 2015-12-29: 25 mg via INTRAVENOUS

## 2015-12-29 NOTE — Patient Instructions (Signed)
[facility] Discharge Instructions for Patients Receiving Chemotherapy  Today you received the following chemotherapy agents Taxol   To help prevent nausea and vomiting after your treatment, we encourage you to take your nausea medication as directed.   If you develop nausea and vomiting that is not controlled by your nausea medication, call the clinic.   BELOW ARE SYMPTOMS THAT SHOULD BE REPORTED IMMEDIATELY:  *FEVER GREATER THAN 100.5 F  *CHILLS WITH OR WITHOUT FEVER  NAUSEA AND VOMITING THAT IS NOT CONTROLLED WITH YOUR NAUSEA MEDICATION  *UNUSUAL SHORTNESS OF BREATH  *UNUSUAL BRUISING OR BLEEDING  TENDERNESS IN MOUTH AND THROAT WITH OR WITHOUT PRESENCE OF ULCERS  *URINARY PROBLEMS  *BOWEL PROBLEMS  UNUSUAL RASH Items with * indicate a potential emergency and should be followed up as soon as possible.  Feel free to call the clinic you have any questions or concerns. The clinic phone number is [phone].  Please show the CHEMO ALERT CARD at check-in to the Emergency Department and triage nurse.   

## 2015-12-30 ENCOUNTER — Telehealth: Payer: Self-pay | Admitting: Cardiology

## 2015-12-30 DIAGNOSIS — E785 Hyperlipidemia, unspecified: Secondary | ICD-10-CM

## 2015-12-30 NOTE — Telephone Encounter (Signed)
New message   Pt is calling about result for liver panel 12-15-2015

## 2015-12-30 NOTE — Telephone Encounter (Signed)
Pt is calling to inform Dr Meda Coffee that the lipid panel she ordered in the past, that the cancer center has been drawing, was being sent to the incorrect lab off [address] and that's why she has been unable to receive her lipid results, for Dr Meda Coffee was never able to visualize results to advise on.  Pt states that the cancer center has sent this incorrectly 2 times, and she would like to come and have her lipids drawn this Monday 6/26 at our office, so Dr Meda Coffee can get the results and follow this accordingly.  Pt states this is in no error on our part, for we have called this order into the cancer center and informed them where to send this lab too, so Dr Meda Coffee could review this and advise on.  Informed the pt that she could absolutely come in on Monday 6/26 and have her lipids checked.  Informed the pt that she needs to come fasting to this lab appt.  Informed the pt that I will make Dr Meda Coffee aware of this error on their part, and I will let her know that she will be coming in on Monday to our office to have this done.   Pt wants to make sure Dr Meda Coffee is ok with this, and I informed her that Dr Meda Coffee previously ordered this, and she would be completely fine with this.  Pt verbalized understanding and agrees with this plan.  Pt gracious for all the assistance provided and wanted to extend her thanks to Dr Meda Coffee.

## 2015-12-31 ENCOUNTER — Other Ambulatory Visit: Payer: Self-pay | Admitting: *Deleted

## 2016-01-03 ENCOUNTER — Other Ambulatory Visit (INDEPENDENT_AMBULATORY_CARE_PROVIDER_SITE_OTHER): Payer: Medicare Other | Admitting: *Deleted

## 2016-01-03 DIAGNOSIS — E785 Hyperlipidemia, unspecified: Secondary | ICD-10-CM | POA: Diagnosis not present

## 2016-01-03 DIAGNOSIS — E538 Deficiency of other specified B group vitamins: Secondary | ICD-10-CM | POA: Diagnosis not present

## 2016-01-03 LAB — LIPID PANEL
Cholesterol: 133 mg/dL (ref 125–200)
HDL: 37 mg/dL — ABNORMAL LOW (ref >=46)
LDL Cholesterol: 62 mg/dL (ref <130)
Total CHOL/HDL Ratio: 3.6 Ratio (ref <=5.0)
Triglycerides: 168 mg/dL — ABNORMAL HIGH (ref <150)
VLDL: 34 mg/dL — ABNORMAL HIGH (ref <30)

## 2016-01-03 NOTE — Addendum Note (Signed)
Addended by: Eulis Foster on: 01/03/2016 08:31 AM   Modules accepted: Orders

## 2016-01-05 ENCOUNTER — Other Ambulatory Visit (HOSPITAL_BASED_OUTPATIENT_CLINIC_OR_DEPARTMENT_OTHER): Payer: Medicare Other

## 2016-01-05 ENCOUNTER — Ambulatory Visit: Payer: Medicare Other

## 2016-01-05 ENCOUNTER — Other Ambulatory Visit: Payer: Self-pay | Admitting: Oncology

## 2016-01-05 ENCOUNTER — Other Ambulatory Visit: Payer: Self-pay | Admitting: *Deleted

## 2016-01-05 ENCOUNTER — Ambulatory Visit (HOSPITAL_BASED_OUTPATIENT_CLINIC_OR_DEPARTMENT_OTHER): Payer: Medicare Other

## 2016-01-05 ENCOUNTER — Other Ambulatory Visit: Payer: Medicare Other

## 2016-01-05 VITALS — BP 152/59 | HR 65 | Temp 97.7°F | Resp 18

## 2016-01-05 DIAGNOSIS — C561 Malignant neoplasm of right ovary: Secondary | ICD-10-CM

## 2016-01-05 DIAGNOSIS — Z5111 Encounter for antineoplastic chemotherapy: Secondary | ICD-10-CM

## 2016-01-05 DIAGNOSIS — Z95828 Presence of other vascular implants and grafts: Secondary | ICD-10-CM

## 2016-01-05 DIAGNOSIS — C482 Malignant neoplasm of peritoneum, unspecified: Secondary | ICD-10-CM

## 2016-01-05 DIAGNOSIS — C799 Secondary malignant neoplasm of unspecified site: Secondary | ICD-10-CM

## 2016-01-05 DIAGNOSIS — C569 Malignant neoplasm of unspecified ovary: Secondary | ICD-10-CM

## 2016-01-05 LAB — CBC WITH DIFFERENTIAL/PLATELET
BASO%: 0.7 % (ref 0.0–2.0)
BASOS ABS: 0 10*3/uL (ref 0.0–0.1)
EOS ABS: 0 10*3/uL (ref 0.0–0.5)
EOS%: 1.1 % (ref 0.0–7.0)
HCT: 31.5 % — ABNORMAL LOW (ref 34.8–46.6)
HEMOGLOBIN: 9.9 g/dL — AB (ref 11.6–15.9)
LYMPH%: 28.4 % (ref 14.0–49.7)
MCH: 31 pg (ref 25.1–34.0)
MCHC: 31.4 g/dL — ABNORMAL LOW (ref 31.5–36.0)
MCV: 98.7 fL (ref 79.5–101.0)
MONO#: 0.2 10*3/uL (ref 0.1–0.9)
MONO%: 5.6 % (ref 0.0–14.0)
NEUT%: 64.2 % (ref 38.4–76.8)
NEUTROS ABS: 1.8 10*3/uL (ref 1.5–6.5)
PLATELETS: 275 10*3/uL (ref 145–400)
RBC: 3.19 10*6/uL — ABNORMAL LOW (ref 3.70–5.45)
RDW: 18.5 % — ABNORMAL HIGH (ref 11.2–14.5)
WBC: 2.9 10*3/uL — AB (ref 3.9–10.3)
lymph#: 0.8 10*3/uL — ABNORMAL LOW (ref 0.9–3.3)

## 2016-01-05 LAB — COMPREHENSIVE METABOLIC PANEL
ALBUMIN: 3.5 g/dL (ref 3.5–5.0)
ALT: 19 U/L (ref 0–55)
ANION GAP: 9 meq/L (ref 3–11)
AST: 15 U/L (ref 5–34)
Alkaline Phosphatase: 79 U/L (ref 40–150)
BILIRUBIN TOTAL: 0.32 mg/dL (ref 0.20–1.20)
BUN: 19.1 mg/dL (ref 7.0–26.0)
CALCIUM: 10.3 mg/dL (ref 8.4–10.4)
CHLORIDE: 101 meq/L (ref 98–109)
CO2: 31 mEq/L — ABNORMAL HIGH (ref 22–29)
CREATININE: 1 mg/dL (ref 0.6–1.1)
EGFR: 55 mL/min/{1.73_m2} — ABNORMAL LOW (ref >90)
Glucose: 113 mg/dl (ref 70–140)
Potassium: 3.5 mEq/L (ref 3.5–5.1)
SODIUM: 141 meq/L (ref 136–145)
TOTAL PROTEIN: 6.5 g/dL (ref 6.4–8.3)

## 2016-01-05 MED ORDER — HEPARIN SOD (PORK) LOCK FLUSH 100 UNIT/ML IV SOLN
500.0000 [IU] | Freq: Once | INTRAVENOUS | Status: AC | PRN
  Administered 2016-01-05: 500 [IU]
  Filled 2016-01-05: qty 5

## 2016-01-05 MED ORDER — FAMOTIDINE IN NACL 20-0.9 MG/50ML-% IV SOLN
20.0000 mg | Freq: Once | INTRAVENOUS | Status: AC
  Administered 2016-01-05: 20 mg via INTRAVENOUS

## 2016-01-05 MED ORDER — DIPHENHYDRAMINE HCL 50 MG/ML IJ SOLN
INTRAMUSCULAR | Status: AC
  Filled 2016-01-05: qty 1

## 2016-01-05 MED ORDER — DIPHENHYDRAMINE HCL 50 MG/ML IJ SOLN
25.0000 mg | Freq: Once | INTRAMUSCULAR | Status: AC
Start: 2016-01-05 — End: 2016-01-05
  Administered 2016-01-05: 25 mg via INTRAVENOUS

## 2016-01-05 MED ORDER — SODIUM CHLORIDE 0.9 % IV SOLN
80.0000 mg/m2 | Freq: Once | INTRAVENOUS | Status: AC
  Administered 2016-01-05: 150 mg via INTRAVENOUS
  Filled 2016-01-05: qty 25

## 2016-01-05 MED ORDER — SODIUM CHLORIDE 0.9% FLUSH
10.0000 mL | INTRAVENOUS | Status: DC | PRN
  Administered 2016-01-05: 10 mL
  Filled 2016-01-05: qty 10

## 2016-01-05 MED ORDER — SODIUM CHLORIDE 0.9 % IV SOLN
Freq: Once | INTRAVENOUS | Status: AC
  Administered 2016-01-05: 09:00:00 via INTRAVENOUS

## 2016-01-05 MED ORDER — SODIUM CHLORIDE 0.9 % IJ SOLN
10.0000 mL | INTRAMUSCULAR | Status: DC | PRN
  Administered 2016-01-05: 10 mL via INTRAVENOUS
  Filled 2016-01-05: qty 10

## 2016-01-05 MED ORDER — FAMOTIDINE IN NACL 20-0.9 MG/50ML-% IV SOLN
INTRAVENOUS | Status: AC
  Filled 2016-01-05: qty 50

## 2016-01-05 MED ORDER — SODIUM CHLORIDE 0.9 % IV SOLN
10.0000 mg | Freq: Once | INTRAVENOUS | Status: AC
  Administered 2016-01-05: 10 mg via INTRAVENOUS
  Filled 2016-01-05: qty 1

## 2016-01-05 NOTE — Progress Notes (Signed)
Pt stated that she is now fatigued two days after tx instead of her one day. Patient also shared that this past Sunday night that her feet felt like "stabbing knives." Patient also c/o indigestion more "than normal."

## 2016-01-05 NOTE — Patient Instructions (Signed)

## 2016-01-05 NOTE — Patient Instructions (Signed)
[facility] Discharge Instructions for Patients Receiving Chemotherapy  Today you received the following chemotherapy agents Taxol   To help prevent nausea and vomiting after your treatment, we encourage you to take your nausea medication as directed.   If you develop nausea and vomiting that is not controlled by your nausea medication, call the clinic.   BELOW ARE SYMPTOMS THAT SHOULD BE REPORTED IMMEDIATELY:  *FEVER GREATER THAN 100.5 F  *CHILLS WITH OR WITHOUT FEVER  NAUSEA AND VOMITING THAT IS NOT CONTROLLED WITH YOUR NAUSEA MEDICATION  *UNUSUAL SHORTNESS OF BREATH  *UNUSUAL BRUISING OR BLEEDING  TENDERNESS IN MOUTH AND THROAT WITH OR WITHOUT PRESENCE OF ULCERS  *URINARY PROBLEMS  *BOWEL PROBLEMS  UNUSUAL RASH Items with * indicate a potential emergency and should be followed up as soon as possible.  Feel free to call the clinic you have any questions or concerns. The clinic phone number is [phone].  Please show the CHEMO ALERT CARD at check-in to the Emergency Department and triage nurse.   

## 2016-01-14 ENCOUNTER — Other Ambulatory Visit: Payer: Self-pay | Admitting: Oncology

## 2016-01-19 ENCOUNTER — Ambulatory Visit (HOSPITAL_BASED_OUTPATIENT_CLINIC_OR_DEPARTMENT_OTHER): Payer: Medicare Other

## 2016-01-19 ENCOUNTER — Telehealth: Payer: Self-pay | Admitting: Oncology

## 2016-01-19 ENCOUNTER — Ambulatory Visit: Payer: Medicare Other

## 2016-01-19 ENCOUNTER — Ambulatory Visit (HOSPITAL_BASED_OUTPATIENT_CLINIC_OR_DEPARTMENT_OTHER): Payer: Medicare Other | Admitting: Nurse Practitioner

## 2016-01-19 ENCOUNTER — Other Ambulatory Visit (HOSPITAL_BASED_OUTPATIENT_CLINIC_OR_DEPARTMENT_OTHER): Payer: Medicare Other

## 2016-01-19 ENCOUNTER — Other Ambulatory Visit: Payer: Medicare Other

## 2016-01-19 VITALS — BP 154/69 | HR 91 | Temp 97.9°F | Resp 18 | Ht 61.0 in | Wt 187.6 lb

## 2016-01-19 DIAGNOSIS — Z95828 Presence of other vascular implants and grafts: Secondary | ICD-10-CM

## 2016-01-19 DIAGNOSIS — C5701 Malignant neoplasm of right fallopian tube: Secondary | ICD-10-CM

## 2016-01-19 DIAGNOSIS — C561 Malignant neoplasm of right ovary: Secondary | ICD-10-CM

## 2016-01-19 DIAGNOSIS — C7962 Secondary malignant neoplasm of left ovary: Secondary | ICD-10-CM | POA: Diagnosis not present

## 2016-01-19 DIAGNOSIS — C7982 Secondary malignant neoplasm of genital organs: Secondary | ICD-10-CM

## 2016-01-19 DIAGNOSIS — C799 Secondary malignant neoplasm of unspecified site: Secondary | ICD-10-CM | POA: Diagnosis not present

## 2016-01-19 DIAGNOSIS — C786 Secondary malignant neoplasm of retroperitoneum and peritoneum: Secondary | ICD-10-CM

## 2016-01-19 DIAGNOSIS — G620 Drug-induced polyneuropathy: Secondary | ICD-10-CM

## 2016-01-19 DIAGNOSIS — Z5111 Encounter for antineoplastic chemotherapy: Secondary | ICD-10-CM

## 2016-01-19 DIAGNOSIS — C7961 Secondary malignant neoplasm of right ovary: Secondary | ICD-10-CM

## 2016-01-19 DIAGNOSIS — C569 Malignant neoplasm of unspecified ovary: Secondary | ICD-10-CM

## 2016-01-19 DIAGNOSIS — D6959 Other secondary thrombocytopenia: Secondary | ICD-10-CM | POA: Diagnosis not present

## 2016-01-19 DIAGNOSIS — C482 Malignant neoplasm of peritoneum, unspecified: Secondary | ICD-10-CM

## 2016-01-19 LAB — COMPREHENSIVE METABOLIC PANEL
ALT: 16 U/L (ref 0–55)
AST: 14 U/L (ref 5–34)
Albumin: 3.4 g/dL — ABNORMAL LOW (ref 3.5–5.0)
Alkaline Phosphatase: 87 U/L (ref 40–150)
Anion Gap: 9 mEq/L (ref 3–11)
BUN: 18 mg/dL (ref 7.0–26.0)
CALCIUM: 9.6 mg/dL (ref 8.4–10.4)
CHLORIDE: 102 meq/L (ref 98–109)
CO2: 29 mEq/L (ref 22–29)
CREATININE: 1 mg/dL (ref 0.6–1.1)
EGFR: 58 mL/min/{1.73_m2} — ABNORMAL LOW (ref >90)
GLUCOSE: 155 mg/dL — AB (ref 70–140)
Potassium: 3.7 mEq/L (ref 3.5–5.1)
Sodium: 140 mEq/L (ref 136–145)
Total Bilirubin: 0.3 mg/dL (ref 0.20–1.20)
Total Protein: 6.7 g/dL (ref 6.4–8.3)

## 2016-01-19 LAB — CBC WITH DIFFERENTIAL/PLATELET
BASO%: 1 % (ref 0.0–2.0)
Basophils Absolute: 0 10*3/uL (ref 0.0–0.1)
EOS%: 0.6 % (ref 0.0–7.0)
Eosinophils Absolute: 0 10*3/uL (ref 0.0–0.5)
HEMATOCRIT: 32.7 % — AB (ref 34.8–46.6)
HEMOGLOBIN: 10.7 g/dL — AB (ref 11.6–15.9)
LYMPH#: 1.1 10*3/uL (ref 0.9–3.3)
LYMPH%: 24.3 % (ref 14.0–49.7)
MCH: 32.2 pg (ref 25.1–34.0)
MCHC: 32.8 g/dL (ref 31.5–36.0)
MCV: 98.4 fL (ref 79.5–101.0)
MONO#: 0.4 10*3/uL (ref 0.1–0.9)
MONO%: 9.9 % (ref 0.0–14.0)
NEUT#: 2.8 10*3/uL (ref 1.5–6.5)
NEUT%: 64.2 % (ref 38.4–76.8)
Platelets: 270 10*3/uL (ref 145–400)
RBC: 3.33 10*6/uL — ABNORMAL LOW (ref 3.70–5.45)
RDW: 20.4 % — AB (ref 11.2–14.5)
WBC: 4.3 10*3/uL (ref 3.9–10.3)

## 2016-01-19 MED ORDER — DIPHENHYDRAMINE HCL 50 MG/ML IJ SOLN
INTRAMUSCULAR | Status: AC
  Filled 2016-01-19: qty 1

## 2016-01-19 MED ORDER — SODIUM CHLORIDE 0.9 % IJ SOLN
10.0000 mL | INTRAMUSCULAR | Status: DC | PRN
  Administered 2016-01-19: 10 mL via INTRAVENOUS
  Filled 2016-01-19: qty 10

## 2016-01-19 MED ORDER — SODIUM CHLORIDE 0.9 % IV SOLN
80.0000 mg/m2 | Freq: Once | INTRAVENOUS | Status: AC
  Administered 2016-01-19: 150 mg via INTRAVENOUS
  Filled 2016-01-19: qty 25

## 2016-01-19 MED ORDER — SODIUM CHLORIDE 0.9 % IV SOLN
Freq: Once | INTRAVENOUS | Status: AC
  Administered 2016-01-19: 11:00:00 via INTRAVENOUS

## 2016-01-19 MED ORDER — HEPARIN SOD (PORK) LOCK FLUSH 100 UNIT/ML IV SOLN
500.0000 [IU] | Freq: Once | INTRAVENOUS | Status: AC | PRN
  Administered 2016-01-19: 500 [IU]
  Filled 2016-01-19: qty 5

## 2016-01-19 MED ORDER — FAMOTIDINE IN NACL 20-0.9 MG/50ML-% IV SOLN
20.0000 mg | Freq: Once | INTRAVENOUS | Status: AC
  Administered 2016-01-19: 20 mg via INTRAVENOUS

## 2016-01-19 MED ORDER — SODIUM CHLORIDE 0.9% FLUSH
10.0000 mL | INTRAVENOUS | Status: DC | PRN
  Administered 2016-01-19: 10 mL
  Filled 2016-01-19: qty 10

## 2016-01-19 MED ORDER — SODIUM CHLORIDE 0.9 % IV SOLN
10.0000 mg | Freq: Once | INTRAVENOUS | Status: AC
  Administered 2016-01-19: 10 mg via INTRAVENOUS
  Filled 2016-01-19: qty 1

## 2016-01-19 MED ORDER — FAMOTIDINE IN NACL 20-0.9 MG/50ML-% IV SOLN
INTRAVENOUS | Status: AC
  Filled 2016-01-19: qty 50

## 2016-01-19 MED ORDER — DIPHENHYDRAMINE HCL 50 MG/ML IJ SOLN
25.0000 mg | Freq: Once | INTRAMUSCULAR | Status: AC
  Administered 2016-01-19: 25 mg via INTRAVENOUS

## 2016-01-19 NOTE — Patient Instructions (Signed)
Paclitaxel injection What is this medicine? PACLITAXEL (PAK li TAX el) is a chemotherapy drug. It targets fast dividing cells, like cancer cells, and causes these cells to die. This medicine is used to treat ovarian cancer, breast cancer, and other cancers. This medicine may be used for other purposes; ask your health care provider or pharmacist if you have questions. What should I tell my health care provider before I take this medicine? They need to know if you have any of these conditions: -blood disorders -irregular heartbeat -infection (especially a virus infection such as chickenpox, cold sores, or herpes) -liver disease -previous or ongoing radiation therapy -an unusual or allergic reaction to paclitaxel, alcohol, polyoxyethylated castor oil, other chemotherapy agents, other medicines, foods, dyes, or preservatives -pregnant or trying to get pregnant -breast-feeding How should I use this medicine? This drug is given as an infusion into a vein. It is administered in a hospital or clinic by a specially trained health care professional. Talk to your pediatrician regarding the use of this medicine in children. Special care may be needed. Overdosage: If you think you have taken too much of this medicine contact a poison control center or emergency room at once. NOTE: This medicine is only for you. Do not share this medicine with others. What if I miss a dose? It is important not to miss your dose. Call your doctor or health care professional if you are unable to keep an appointment. What may interact with this medicine? Do not take this medicine with any of the following medications: -disulfiram -metronidazole This medicine may also interact with the following medications: -cyclosporine -diazepam -ketoconazole -medicines to increase blood counts like filgrastim, pegfilgrastim, sargramostim -other chemotherapy drugs like cisplatin, doxorubicin, epirubicin, etoposide, teniposide,  vincristine -quinidine -testosterone -vaccines -verapamil Talk to your doctor or health care professional before taking any of these medicines: -acetaminophen -aspirin -ibuprofen -ketoprofen -naproxen This list may not describe all possible interactions. Give your health care provider a list of all the medicines, herbs, non-prescription drugs, or dietary supplements you use. Also tell them if you smoke, drink alcohol, or use illegal drugs. Some items may interact with your medicine. What should I watch for while using this medicine? Your condition will be monitored carefully while you are receiving this medicine. You will need important blood work done while you are taking this medicine. This drug may make you feel generally unwell. This is not uncommon, as chemotherapy can affect healthy cells as well as cancer cells. Report any side effects. Continue your course of treatment even though you feel ill unless your doctor tells you to stop. This medicine can cause serious allergic reactions. To reduce your risk you will need to take other medicine(s) before treatment with this medicine. In some cases, you may be given additional medicines to help with side effects. Follow all directions for their use. Call your doctor or health care professional for advice if you get a fever, chills or sore throat, or other symptoms of a cold or flu. Do not treat yourself. This drug decreases your body's ability to fight infections. Try to avoid being around people who are sick. This medicine may increase your risk to bruise or bleed. Call your doctor or health care professional if you notice any unusual bleeding. Be careful brushing and flossing your teeth or using a toothpick because you may get an infection or bleed more easily. If you have any dental work done, tell your dentist you are receiving this medicine. Avoid taking products that   contain aspirin, acetaminophen, ibuprofen, naproxen, or ketoprofen unless  instructed by your doctor. These medicines may hide a fever. Do not become pregnant while taking this medicine. Women should inform their doctor if they wish to become pregnant or think they might be pregnant. There is a potential for serious side effects to an unborn child. Talk to your health care professional or pharmacist for more information. Do not breast-feed an infant while taking this medicine. Men are advised not to father a child while receiving this medicine. This product may contain alcohol. Ask your pharmacist or healthcare provider if this medicine contains alcohol. Be sure to tell all healthcare providers you are taking this medicine. Certain medicines, like metronidazole and disulfiram, can cause an unpleasant reaction when taken with alcohol. The reaction includes flushing, headache, nausea, vomiting, sweating, and increased thirst. The reaction can last from 30 minutes to several hours. What side effects may I notice from receiving this medicine? Side effects that you should report to your doctor or health care professional as soon as possible: -allergic reactions like skin rash, itching or hives, swelling of the face, lips, or tongue -low blood counts - This drug may decrease the number of white blood cells, red blood cells and platelets. You may be at increased risk for infections and bleeding. -signs of infection - fever or chills, cough, sore throat, pain or difficulty passing urine -signs of decreased platelets or bleeding - bruising, pinpoint red spots on the skin, black, tarry stools, nosebleeds -signs of decreased red blood cells - unusually weak or tired, fainting spells, lightheadedness -breathing problems -chest pain -high or low blood pressure -mouth sores -nausea and vomiting -pain, swelling, redness or irritation at the injection site -pain, tingling, numbness in the hands or feet -slow or irregular heartbeat -swelling of the ankle, feet, hands Side effects that  usually do not require medical attention (report to your doctor or health care professional if they continue or are bothersome): -bone pain -complete hair loss including hair on your head, underarms, pubic hair, eyebrows, and eyelashes -changes in the color of fingernails -diarrhea -loosening of the fingernails -loss of appetite -muscle or joint pain -red flush to skin -sweating This list may not describe all possible side effects. Call your doctor for medical advice about side effects. You may report side effects to FDA at 1-800-FDA-1088. Where should I keep my medicine? This drug is given in a hospital or clinic and will not be stored at home. NOTE: This sheet is a summary. It may not cover all possible information. If you have questions about this medicine, talk to your doctor, pharmacist, or health care provider.    2016, [employer]. (2015-02-11 13:02:56)  

## 2016-01-19 NOTE — Telephone Encounter (Signed)
Gv pt appt for 02/02/16.

## 2016-01-19 NOTE — Progress Notes (Signed)
[place] OFFICE PROGRESS NOTE   Diagnosis:  Ovarian cancer  INTERVAL HISTORY:   Lynn Ramirez returns as scheduled. She completed another treatment with weekly Taxol 01/05/2016. She has occasional mild nausea. No vomiting. No significant mouth sores. She notes diarrhea with her current laxative regimen. She plans on adjusting the regimen. She denies abdominal pain. She is advancing her diet slightly and tolerating this well. She is concerned about weight gain. She reports worsening pain in her feet at nighttime. She would like to resume gabapentin. She has mild numbness in the fingertips which is stable and predated Taxol. With regard to her breathing she states that she has "good days and bad".  Objective:  Vital signs in last 24 hours:  Blood pressure 154/69, pulse 91, temperature 97.9 F (36.6 C), temperature source Oral, resp. rate 18, height 5\' 1"  (1.549 m), weight 187 lb 9.6 oz (85.095 kg), SpO2 97 %.    HEENT: Question tiny ulceration right lower inner lip. Resp: Lungs clear bilaterally. Cardio: Regular rate and rhythm. GI: Abdomen is soft. Mild tenderness right abdomen. No hepatomegaly. No masses. No apparent ascites. Vascular: No leg edema. Neuro: Vibratory sense mildly decreased over the fingertips per tuning fork exam.  Skin: No rash. Port-A-Cath without erythema.    Lab Results:  Lab Results  Component Value Date   WBC 4.3 01/19/2016   HGB 10.7* 01/19/2016   HCT 32.7* 01/19/2016   MCV 98.4 01/19/2016   PLT 270 01/19/2016   NEUTROABS 2.8 01/19/2016    Imaging:  No results found.  Medications: I have reviewed the patient's current medications.  Assessment/Plan: 1. 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, immunohistochemical profile consistent with a GYN primary, elevated CA 125  Staging CTs of the chest, abdomen, and pelvis on 10/06/2014 revealed a loculated right pleural  effusion, ascites, and omental/mesenteric thickening  Cytology from peritoneal fluid 10/13/2014 revealed malignant cells consistent with metastatic adenocarcinoma  Biopsy of an omental mass on 11/02/2014 revealed invasive serous carcinoma with psammoma bodies  Cycle 1 Taxol/carboplatin 10/28/2014  Cycle 2 Taxol/carboplatin 11/18/2014  Cycle 3 Taxol/carboplatin 12/09/2014  CT scan 12/23/2014 with interval improvement in peritoneal carcinomatosis with near-complete resolution of ascites and decreased omental nodularity. Significant improvement in malignant right pleural effusion.  Status post robotic-assisted laparoscopic hysterectomy with bilateral salpingoophorectomy, omentectomy, radical tumor debulking 12/29/2014. Per Dr. Serita Grit office note 01/14/2015 cytoreduction was optimal with residual disease remaining only in the 1 mm implants on the small intestine. 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  Cycle 1 adjuvant Taxol/carboplatin 01/20/2015  Cycle 2 adjuvant Taxol/carboplatin 02/10/2015  Cycle 3 adjuvant Taxol/carboplatin 03/10/2015  CA 125 on 1013 2016-42  CTs of the chest, abdomen, and pelvis 05/31/2015 with no evidence of progressive ovarian cancer  CTs of the chest, abdomen, and pelvis 08/07/2015 and 08/08/2015-no evidence of progressive ovarian cancer  Peritoneal studding noted at the time of the cholecystectomy procedure 08/09/2015  Elevated CA 125 10/07/2015  CT 10/07/2015 with stricturing at the sigmoid colon, constipation, and omental nodularity  Initiation of salvage weekly Taxol chemotherapy 10/13/2015  Taxol changed to every 2 weeks beginning 01/19/2016 due to painful  neuropathy. 2. COPD 3. Dyspnea secondary to COPD and the large right pleural effusion, status post therapeutic thoracentesis procedures 09/30/2014,10/09/2014, and 10/21/2014. Left  thoracentesis 10/30/2014 4. Left nephrectomy as a child 5. Delayed nausea following cycle 1 Taxol/carboplatin, Aloxi/Emend added with cycle 2 with improvement. 6. Right lower extremity edema, right calf pain 12/09/2014. Negative venous Doppler 12/09/2014. 7. Diffuse pruritus following cycle 1 adjuvant Taxol/carboplatin 01/20/2015, no rash, resolved with a steroid dose pack 8. Thrombocytopenia second to chemotherapy, the carboplatin was dose reduced with cycle 2 adjuvant Taxol/carboplatin 02/10/2015 9. Chemotherapy-induced peripheral neuropathy-painful peripheral neuropathy involving the feet 01/19/2016. Gabapentin resumed. 10. Admission with acute cholecystitis 08/07/2015, status post a cholecystectomy 08/09/2015 11. Admission 10/08/2015 with abdominal pain/constipation, improved with bowel rest and laxatives 12. Mild right hydronephrosis noted on the CT 10/07/2015. Renal ultrasound 12/16/2015 with no hydronephrosis noted.   Disposition: Lynn Ramirez appears stable. She has completed 11 treatments with weekly Taxol. Most recent CA-125 was further improved. We will follow-up on the CA-125 from today.  She has developed painful peripheral neuropathy at nighttime in her feet. She will resume gabapentin. Plan to proceed with Taxol today as scheduled. The treatment schedule will be adjusted from weekly to every other week going forward.  She will return for a follow-up visit in Taxol in 2 weeks. She will contact the office in the interim with any problems.  Plan reviewed with Dr. Benay Spice.    Ned Card ANP/GNP-BC   01/19/2016  10:28 AM

## 2016-01-19 NOTE — Patient Instructions (Signed)

## 2016-01-20 ENCOUNTER — Telehealth: Payer: Self-pay

## 2016-01-20 LAB — CA 125: Cancer Antigen (CA) 125: 38 U/mL (ref 0.0–38.1)

## 2016-01-20 NOTE — Telephone Encounter (Signed)
Called and informed pt of CA-125 result. Patient verbalized understanding and appreciative of call.

## 2016-01-20 NOTE — Telephone Encounter (Signed)
-----   Message from [name], NP sent at 01/20/2016  8:50 AM EDT ----- Please let her know the CA-125 is better.

## 2016-01-25 DIAGNOSIS — E538 Deficiency of other specified B group vitamins: Secondary | ICD-10-CM | POA: Diagnosis not present

## 2016-01-26 ENCOUNTER — Telehealth: Payer: Self-pay | Admitting: Nutrition

## 2016-01-26 NOTE — Telephone Encounter (Signed)
Received a phone call from patient requesting information on diet advancement. Patient last seen in April 2017 for low fiber diet secondary to partial small bowel obstruction and ovarian cancer. Patient concerned with weight gain with weight documented as 187 pounds up from 181 pounds March 31. Patient was unavailable but I left message for her to contact me by phone and/or schedule nutrition appointment for follow-up. Left my name and phone number.

## 2016-01-30 ENCOUNTER — Other Ambulatory Visit: Payer: Self-pay | Admitting: Oncology

## 2016-02-02 ENCOUNTER — Ambulatory Visit (HOSPITAL_BASED_OUTPATIENT_CLINIC_OR_DEPARTMENT_OTHER): Payer: Medicare Other

## 2016-02-02 ENCOUNTER — Telehealth: Payer: Self-pay | Admitting: Nurse Practitioner

## 2016-02-02 ENCOUNTER — Ambulatory Visit (HOSPITAL_BASED_OUTPATIENT_CLINIC_OR_DEPARTMENT_OTHER): Payer: Medicare Other | Admitting: Nurse Practitioner

## 2016-02-02 ENCOUNTER — Telehealth: Payer: Self-pay | Admitting: *Deleted

## 2016-02-02 ENCOUNTER — Ambulatory Visit: Payer: Medicare Other

## 2016-02-02 VITALS — BP 128/68 | HR 85 | Temp 97.6°F | Resp 17 | Ht 61.0 in | Wt 188.7 lb

## 2016-02-02 DIAGNOSIS — C561 Malignant neoplasm of right ovary: Secondary | ICD-10-CM

## 2016-02-02 DIAGNOSIS — C7961 Secondary malignant neoplasm of right ovary: Secondary | ICD-10-CM | POA: Diagnosis not present

## 2016-02-02 DIAGNOSIS — C5701 Malignant neoplasm of right fallopian tube: Secondary | ICD-10-CM

## 2016-02-02 DIAGNOSIS — D6959 Other secondary thrombocytopenia: Secondary | ICD-10-CM | POA: Diagnosis not present

## 2016-02-02 DIAGNOSIS — Z95828 Presence of other vascular implants and grafts: Secondary | ICD-10-CM

## 2016-02-02 DIAGNOSIS — C569 Malignant neoplasm of unspecified ovary: Secondary | ICD-10-CM

## 2016-02-02 DIAGNOSIS — C799 Secondary malignant neoplasm of unspecified site: Secondary | ICD-10-CM

## 2016-02-02 DIAGNOSIS — Z5111 Encounter for antineoplastic chemotherapy: Secondary | ICD-10-CM

## 2016-02-02 DIAGNOSIS — G620 Drug-induced polyneuropathy: Secondary | ICD-10-CM

## 2016-02-02 DIAGNOSIS — C7962 Secondary malignant neoplasm of left ovary: Secondary | ICD-10-CM | POA: Diagnosis not present

## 2016-02-02 DIAGNOSIS — C482 Malignant neoplasm of peritoneum, unspecified: Secondary | ICD-10-CM

## 2016-02-02 LAB — COMPREHENSIVE METABOLIC PANEL
ALBUMIN: 3.6 g/dL (ref 3.5–5.0)
ALK PHOS: 84 U/L (ref 40–150)
ALT: 17 U/L (ref 0–55)
AST: 14 U/L (ref 5–34)
Anion Gap: 9 mEq/L (ref 3–11)
BILIRUBIN TOTAL: 0.43 mg/dL (ref 0.20–1.20)
BUN: 18.4 mg/dL (ref 7.0–26.0)
CO2: 30 meq/L — AB (ref 22–29)
Calcium: 10 mg/dL (ref 8.4–10.4)
Chloride: 103 mEq/L (ref 98–109)
Creatinine: 1 mg/dL (ref 0.6–1.1)
EGFR: 58 mL/min/{1.73_m2} — ABNORMAL LOW (ref >90)
GLUCOSE: 107 mg/dL (ref 70–140)
POTASSIUM: 4.2 meq/L (ref 3.5–5.1)
SODIUM: 142 meq/L (ref 136–145)
TOTAL PROTEIN: 7.1 g/dL (ref 6.4–8.3)

## 2016-02-02 LAB — CBC WITH DIFFERENTIAL/PLATELET
BASO%: 0.5 % (ref 0.0–2.0)
BASOS ABS: 0 10*3/uL (ref 0.0–0.1)
EOS ABS: 0.1 10*3/uL (ref 0.0–0.5)
EOS%: 2.1 % (ref 0.0–7.0)
HCT: 34.9 % (ref 34.8–46.6)
HEMOGLOBIN: 10.9 g/dL — AB (ref 11.6–15.9)
LYMPH%: 26.9 % (ref 14.0–49.7)
MCH: 31.6 pg (ref 25.1–34.0)
MCHC: 31.2 g/dL — ABNORMAL LOW (ref 31.5–36.0)
MCV: 101.2 fL — ABNORMAL HIGH (ref 79.5–101.0)
MONO#: 0.4 10*3/uL (ref 0.1–0.9)
MONO%: 8.8 % (ref 0.0–14.0)
NEUT#: 2.6 10*3/uL (ref 1.5–6.5)
NEUT%: 61.7 % (ref 38.4–76.8)
Platelets: 241 10*3/uL (ref 145–400)
RBC: 3.45 10*6/uL — AB (ref 3.70–5.45)
RDW: 18.4 % — AB (ref 11.2–14.5)
WBC: 4.2 10*3/uL (ref 3.9–10.3)
lymph#: 1.1 10*3/uL (ref 0.9–3.3)

## 2016-02-02 MED ORDER — FAMOTIDINE IN NACL 20-0.9 MG/50ML-% IV SOLN
20.0000 mg | Freq: Once | INTRAVENOUS | Status: AC
  Administered 2016-02-02: 20 mg via INTRAVENOUS

## 2016-02-02 MED ORDER — SODIUM CHLORIDE 0.9 % IV SOLN
80.0000 mg/m2 | Freq: Once | INTRAVENOUS | Status: AC
  Administered 2016-02-02: 150 mg via INTRAVENOUS
  Filled 2016-02-02: qty 25

## 2016-02-02 MED ORDER — FAMOTIDINE IN NACL 20-0.9 MG/50ML-% IV SOLN
INTRAVENOUS | Status: AC
  Filled 2016-02-02: qty 50

## 2016-02-02 MED ORDER — SODIUM CHLORIDE 0.9% FLUSH
10.0000 mL | INTRAVENOUS | Status: DC | PRN
  Administered 2016-02-02: 10 mL
  Filled 2016-02-02: qty 10

## 2016-02-02 MED ORDER — SODIUM CHLORIDE 0.9 % IJ SOLN
10.0000 mL | INTRAMUSCULAR | Status: DC | PRN
  Administered 2016-02-02: 10 mL via INTRAVENOUS
  Filled 2016-02-02: qty 10

## 2016-02-02 MED ORDER — DEXAMETHASONE SODIUM PHOSPHATE 100 MG/10ML IJ SOLN
10.0000 mg | Freq: Once | INTRAMUSCULAR | Status: AC
  Administered 2016-02-02: 10 mg via INTRAVENOUS
  Filled 2016-02-02: qty 1

## 2016-02-02 MED ORDER — HEPARIN SOD (PORK) LOCK FLUSH 100 UNIT/ML IV SOLN
500.0000 [IU] | Freq: Once | INTRAVENOUS | Status: AC | PRN
  Administered 2016-02-02: 500 [IU]
  Filled 2016-02-02: qty 5

## 2016-02-02 MED ORDER — DIPHENHYDRAMINE HCL 50 MG/ML IJ SOLN
25.0000 mg | Freq: Once | INTRAMUSCULAR | Status: AC
  Administered 2016-02-02: 25 mg via INTRAVENOUS

## 2016-02-02 MED ORDER — SODIUM CHLORIDE 0.9 % IV SOLN
Freq: Once | INTRAVENOUS | Status: AC
  Administered 2016-02-02: 11:00:00 via INTRAVENOUS

## 2016-02-02 MED ORDER — DIPHENHYDRAMINE HCL 50 MG/ML IJ SOLN
INTRAMUSCULAR | Status: AC
  Filled 2016-02-02: qty 1

## 2016-02-02 NOTE — Telephone Encounter (Signed)
Per staff message and POF I have scheduled appts. Advised scheduler of appts. JMW  

## 2016-02-02 NOTE — Telephone Encounter (Signed)
Gave pt cal & avs °

## 2016-02-02 NOTE — Progress Notes (Addendum)
[place] OFFICE PROGRESS NOTE   Diagnosis:  Ovarian cancer  INTERVAL HISTORY:   Lynn Ramirez returns as scheduled. She completed another cycle of Taxol 01/19/2016. The numbness/pain in her feet is better. She resumed Neurontin following her last visit. She has intermittent leg swelling that resolves with elevation. She denies nausea/vomiting. No mouth sores. No abdominal distention. She has occasional upper abdominal pain. Bowels moving regularly. She takes Miralax if needed. She began experiencing increased shortness of breath yesterday. She used her "rescue inhaler" with improvement. She continues to have dyspnea today. She has dyspnea periodically and feels the current episode is no different. She denies fever.  Objective:  Vital signs in last 24 hours:  Blood pressure 128/68, pulse 85, temperature 97.6 F (36.4 C), temperature source Oral, resp. rate 17, height 5\' 1"  (1.549 m), weight 188 lb 11.2 oz (85.6 kg), SpO2 94 %.    HEENT: No thrush or ulcers. Resp: Lungs are clear. No respiratory distress. Cardio: Regular rate and rhythm. GI: Abdomen is soft. Mild tenderness right abdomen. No hepatomegaly. No mass. No apparent ascites. Vascular: Trace lower leg edema bilaterally. Calves soft and nontender. Skin: No rash. Port-A-Cath without erythema.    Lab Results:  Lab Results  Component Value Date   WBC 4.3 01/19/2016   HGB 10.7 (L) 01/19/2016   HCT 32.7 (L) 01/19/2016   MCV 98.4 01/19/2016   PLT 270 01/19/2016   NEUTROABS 2.8 01/19/2016    Imaging:  No results found.  Medications: I have reviewed the patient's current medications.  Assessment/Plan: 1. 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 Staging CTs of the chest, abdomen, and pelvis on 10/06/2014 revealed a loculated right pleural effusion, ascites, and  omental/mesenteric thickening ? Cytology from peritoneal fluid 10/13/2014 revealed malignant cells consistent with metastatic adenocarcinoma ? 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 Cycle 1 Taxol/carboplatin 10/28/2014 ? Cycle 2 Taxol/carboplatin 11/18/2014 ? Cycle 3 Taxol/carboplatin 12/09/2014 ? CT scan 12/23/2014 with interval improvement in peritoneal carcinomatosis with near-complete resolution of ascites and decreased omental nodularity. Significant improvement in malignant right pleural effusion. ? Status post robotic-assisted laparoscopic hysterectomy with bilateral salpingoophorectomy, omentectomy, radical tumor debulking 12/29/2014. Per Dr. Serita Grit office note 01/14/2015 cytoreduction was optimal with residual disease remaining only in the 1 mm implants on the small intestine. 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 ? Cycle 1 adjuvant Taxol/carboplatin 01/20/2015 ? Cycle 2 adjuvant Taxol/carboplatin 02/10/2015 ? Cycle 3 adjuvant Taxol/carboplatin 03/10/2015 ? CA 125 on 1013 2016-42 ? CTs of the chest, abdomen, and pelvis 05/31/2015 with no evidence of progressive ovarian cancer ? CTs of the chest, abdomen, and pelvis 08/07/2015 and 08/08/2015-no evidence of progressive ovarian cancer ? Peritoneal studding noted at the time of the cholecystectomy procedure 08/09/2015 ? Elevated CA 125 10/07/2015 ? CT 10/07/2015 with stricturing at the sigmoid colon, constipation, and omental nodularity ? Initiation of salvage weekly Taxol chemotherapy 10/13/2015 ? Taxol changed to every 2 weeks beginning 01/19/2016 due to painful neuropathy. 2. COPD 3. Dyspnea  secondary to COPD and the large right pleural effusion, status post therapeutic thoracentesis procedures 09/30/2014,10/09/2014, and 10/21/2014. Left thoracentesis 10/30/2014 4. Left nephrectomy as a child 5. Delayed nausea  following cycle 1 Taxol/carboplatin, Aloxi/Emend added with cycle 2 with improvement. 6. Right lower extremity edema, right calf pain 12/09/2014. Negative venous Doppler 12/09/2014. 7. Diffuse pruritus following cycle 1 adjuvant Taxol/carboplatin 01/20/2015, no rash, resolved with a steroid dose pack 8. Thrombocytopenia second to chemotherapy, the carboplatin was dose reduced with cycle 2 adjuvant Taxol/carboplatin 02/10/2015 9. Chemotherapy-induced peripheral neuropathy-painful peripheral neuropathy involving the feet 01/19/2016. Gabapentin resumed. Improved 02/02/2016. 10. Admission with acute cholecystitis 08/07/2015, status post a cholecystectomy 08/09/2015 11. Admission 10/08/2015 with abdominal pain/constipation, improved with bowel rest and laxatives 12. Mild right hydronephrosis noted on the CT 10/07/2015. Renal ultrasound 12/16/2015 with no hydronephrosis noted.   Disposition: Lynn Ramirez appears stable. The most recent CA-125 was further improved. Plan to continue every 2 week Taxol.  The dyspnea is likely related to COPD. She will contact the office if the dyspnea worsens or she develops fever. For now she will continue the inhaler as she does normally when these episodes occur.  She will return for a follow-up visit and Taxol in 2 weeks. She will contact the office in the interim as outlined above or with any other problems.  Plan reviewed with Dr. Benay Spice.   Ned Card ANP/GNP-BC   02/02/2016  9:53 AM

## 2016-02-02 NOTE — Patient Instructions (Signed)
[facility] Discharge Instructions for Patients Receiving Chemotherapy  Today you received the following chemotherapy agents Taxol   To help prevent nausea and vomiting after your treatment, we encourage you to take your nausea medication as directed.   If you develop nausea and vomiting that is not controlled by your nausea medication, call the clinic.   BELOW ARE SYMPTOMS THAT SHOULD BE REPORTED IMMEDIATELY:  *FEVER GREATER THAN 100.5 F  *CHILLS WITH OR WITHOUT FEVER  NAUSEA AND VOMITING THAT IS NOT CONTROLLED WITH YOUR NAUSEA MEDICATION  *UNUSUAL SHORTNESS OF BREATH  *UNUSUAL BRUISING OR BLEEDING  TENDERNESS IN MOUTH AND THROAT WITH OR WITHOUT PRESENCE OF ULCERS  *URINARY PROBLEMS  *BOWEL PROBLEMS  UNUSUAL RASH Items with * indicate a potential emergency and should be followed up as soon as possible.  Feel free to call the clinic you have any questions or concerns. The clinic phone number is [phone].  Please show the CHEMO ALERT CARD at check-in to the Emergency Department and triage nurse.   

## 2016-02-02 NOTE — Patient Instructions (Signed)
[facility] Discharge Instructions for Patients Receiving Chemotherapy  Today you received the following chemotherapy agents Taxol   To help prevent nausea and vomiting after your treatment, we encourage you to take your nausea medication as directed.   If you develop nausea and vomiting that is not controlled by your nausea medication, call the clinic.   BELOW ARE SYMPTOMS THAT SHOULD BE REPORTED IMMEDIATELY:  *FEVER GREATER THAN 100.5 F  *CHILLS WITH OR WITHOUT FEVER  NAUSEA AND VOMITING THAT IS NOT CONTROLLED WITH YOUR NAUSEA MEDICATION  *UNUSUAL SHORTNESS OF BREATH  *UNUSUAL BRUISING OR BLEEDING  TENDERNESS IN MOUTH AND THROAT WITH OR WITHOUT PRESENCE OF ULCERS  *URINARY PROBLEMS  *BOWEL PROBLEMS  UNUSUAL RASH Items with * indicate a potential emergency and should be followed up as soon as possible.  Feel free to call the clinic you have any questions or concerns. The clinic phone number is [phone].  Please show the CHEMO ALERT CARD at check-in to the Emergency Department and triage nurse.   

## 2016-02-03 ENCOUNTER — Telehealth: Payer: Self-pay

## 2016-02-03 ENCOUNTER — Ambulatory Visit: Payer: Medicare Other | Admitting: Nutrition

## 2016-02-03 LAB — CA 125: CANCER ANTIGEN (CA) 125: 34.7 U/mL (ref 0.0–38.1)

## 2016-02-03 NOTE — Progress Notes (Signed)
Nutrition follow-up completed with patient diagnosed with ovarian cancer to provide additional diet information. Spoke with both patient and M.D. who reports patient can advance diet and start including some higher fiber foods.  Nutrition diagnosis:  Food and nutrition related knowledge deficit related to low fiber diet as evidenced by no prior need for nutrition related information.  Intervention: Encouraged patient to begin adding one new food every 3 days and evaluate any nutrition impact symptoms. Recommended patient keep a journal of which foods.  She has added. Reviewed specific diet information on fact sheet with patient. Questions were answered.  Teach back method used.  Monitoring, evaluation, goals: Patient will tolerate diet advancement.  Patient will contact me for questions.

## 2016-02-03 NOTE — Telephone Encounter (Signed)
-----   Message from [name], NP sent at 02/03/2016  8:54 AM EDT ----- Please let her know CA125 is further improved.

## 2016-02-03 NOTE — Telephone Encounter (Signed)
Called and spoke with patients husband, informed him patients CA-125 has improved. [name] verbalized understanding and denies any questions or concerns at this time.

## 2016-02-08 ENCOUNTER — Other Ambulatory Visit: Payer: Self-pay | Admitting: Emergency Medicine

## 2016-02-16 ENCOUNTER — Ambulatory Visit (HOSPITAL_BASED_OUTPATIENT_CLINIC_OR_DEPARTMENT_OTHER): Payer: Medicare Other | Admitting: Nurse Practitioner

## 2016-02-16 ENCOUNTER — Telehealth: Payer: Self-pay | Admitting: Oncology

## 2016-02-16 ENCOUNTER — Ambulatory Visit: Payer: Medicare Other

## 2016-02-16 ENCOUNTER — Other Ambulatory Visit: Payer: Self-pay

## 2016-02-16 ENCOUNTER — Other Ambulatory Visit (HOSPITAL_BASED_OUTPATIENT_CLINIC_OR_DEPARTMENT_OTHER): Payer: Medicare Other

## 2016-02-16 VITALS — BP 149/57 | HR 79 | Temp 98.0°F | Resp 18 | Ht 61.0 in | Wt 194.5 lb

## 2016-02-16 DIAGNOSIS — G622 Polyneuropathy due to other toxic agents: Secondary | ICD-10-CM

## 2016-02-16 DIAGNOSIS — C5701 Malignant neoplasm of right fallopian tube: Secondary | ICD-10-CM

## 2016-02-16 DIAGNOSIS — C799 Secondary malignant neoplasm of unspecified site: Secondary | ICD-10-CM | POA: Diagnosis not present

## 2016-02-16 DIAGNOSIS — C561 Malignant neoplasm of right ovary: Secondary | ICD-10-CM

## 2016-02-16 DIAGNOSIS — Z95828 Presence of other vascular implants and grafts: Secondary | ICD-10-CM

## 2016-02-16 LAB — CBC WITH DIFFERENTIAL/PLATELET
BASO%: 0.3 % (ref 0.0–2.0)
Basophils Absolute: 0 10*3/uL (ref 0.0–0.1)
EOS%: 1.8 % (ref 0.0–7.0)
Eosinophils Absolute: 0.1 10*3/uL (ref 0.0–0.5)
HCT: 33.4 % — ABNORMAL LOW (ref 34.8–46.6)
HGB: 10.4 g/dL — ABNORMAL LOW (ref 11.6–15.9)
LYMPH%: 26.6 % (ref 14.0–49.7)
MCH: 31.4 pg (ref 25.1–34.0)
MCHC: 31.1 g/dL — AB (ref 31.5–36.0)
MCV: 100.9 fL (ref 79.5–101.0)
MONO#: 0.3 10*3/uL (ref 0.1–0.9)
MONO%: 8.3 % (ref 0.0–14.0)
NEUT#: 2.5 10*3/uL (ref 1.5–6.5)
NEUT%: 63 % (ref 38.4–76.8)
PLATELETS: 223 10*3/uL (ref 145–400)
RBC: 3.31 10*6/uL — AB (ref 3.70–5.45)
RDW: 17.8 % — ABNORMAL HIGH (ref 11.2–14.5)
WBC: 4 10*3/uL (ref 3.9–10.3)
lymph#: 1.1 10*3/uL (ref 0.9–3.3)

## 2016-02-16 LAB — COMPREHENSIVE METABOLIC PANEL
ALK PHOS: 73 U/L (ref 40–150)
ALT: 15 U/L (ref 0–55)
ANION GAP: 6 meq/L (ref 3–11)
AST: 12 U/L (ref 5–34)
Albumin: 3.3 g/dL — ABNORMAL LOW (ref 3.5–5.0)
BILIRUBIN TOTAL: 0.3 mg/dL (ref 0.20–1.20)
BUN: 22 mg/dL (ref 7.0–26.0)
CHLORIDE: 106 meq/L (ref 98–109)
CO2: 28 mEq/L (ref 22–29)
CREATININE: 0.9 mg/dL (ref 0.6–1.1)
Calcium: 9.5 mg/dL (ref 8.4–10.4)
EGFR: 63 mL/min/{1.73_m2} — AB (ref >90)
Glucose: 111 mg/dl (ref 70–140)
Potassium: 4.1 mEq/L (ref 3.5–5.1)
Sodium: 140 mEq/L (ref 136–145)
Total Protein: 6.4 g/dL (ref 6.4–8.3)

## 2016-02-16 MED ORDER — SODIUM CHLORIDE 0.9 % IJ SOLN
10.0000 mL | INTRAMUSCULAR | Status: DC | PRN
  Administered 2016-02-16: 10 mL via INTRAVENOUS
  Filled 2016-02-16: qty 10

## 2016-02-16 MED ORDER — TRAMADOL HCL 50 MG PO TABS: 50.0000 mg | ORAL_TABLET | Freq: Two times a day (BID) | ORAL | 0 refills | Status: DC | PRN

## 2016-02-16 MED ORDER — HEPARIN SOD (PORK) LOCK FLUSH 100 UNIT/ML IV SOLN
500.0000 [IU] | Freq: Once | INTRAVENOUS | Status: AC
  Administered 2016-02-16: 500 [IU] via INTRAVENOUS
  Filled 2016-02-16: qty 5

## 2016-02-16 NOTE — Telephone Encounter (Signed)
Called tramadol prescription into pharmacy. Called and informed pt.

## 2016-02-16 NOTE — Telephone Encounter (Signed)
Gave pt cal & avs °

## 2016-02-16 NOTE — Progress Notes (Addendum)
[name] OFFICE PROGRESS NOTE   Diagnosis:  Ovarian cancer  INTERVAL HISTORY:   Lynn Ramirez returns as scheduled. She completed another cycle of Taxol 02/02/2016. She denies nausea/vomiting. No mouth sores. Bowels moving regularly. She continues to advance her diet. She has noted increased leg swelling recently. She has been very active. She was in the car yesterday for 4 and a half hours. The leg swelling improves overnight and is much better this morning. She has restless legs at bedtime. She has intermittent numbness/tingling/pain in her feet. Overall this is better since resuming Neurontin. Yesterday she felt like she was wearing socks. Today she does not note this sensation. She has intermittent tingling in the fingertips. She has dropped a few items recently.  Objective:  Vital signs in last 24 hours:  Blood pressure (!) 149/57, pulse 79, temperature 98 F (36.7 C), temperature source Oral, resp. rate 18, height 5\' 1"  (1.549 m), weight 194 lb 8 oz (88.2 kg), SpO2 98 %.    HEENT: No thrush or ulcers. Resp: Lungs clear bilaterally. Cardio: Regular rate and rhythm. GI: Abdomen soft. Mild tenderness right abdomen. No hepatomegaly. No mass. No apparent ascites. Vascular: Trace lower leg edema bilaterally. Neuro: Vibratory sense mildly decreased over the fingertips per tuning fork exam.  Skin: No rash. Port-A-Cath without erythema.    Lab Results:  Lab Results  Component Value Date   WBC 4.0 02/16/2016   HGB 10.4 (L) 02/16/2016   HCT 33.4 (L) 02/16/2016   MCV 100.9 02/16/2016   PLT 223 02/16/2016   NEUTROABS 2.5 02/16/2016    Imaging:  No results found.  Medications: I have reviewed the patient's current medications.  Assessment/Plan: 1. 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 Staging CTs of the chest, abdomen,  and pelvis on 10/06/2014 revealed a loculated right pleural effusion, ascites, and omental/mesenteric thickening ? Cytology from peritoneal fluid 10/13/2014 revealed malignant cells consistent with metastatic adenocarcinoma ? 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 Cycle 1 Taxol/carboplatin 10/28/2014 ? Cycle 2 Taxol/carboplatin 11/18/2014 ? Cycle 3 Taxol/carboplatin 12/09/2014 ? CT scan 12/23/2014 with interval improvement in peritoneal carcinomatosis with near-complete resolution of ascites and decreased omental nodularity. Significant improvement in malignant right pleural effusion. ? Status post robotic-assisted laparoscopic hysterectomy with bilateral salpingoophorectomy, omentectomy, radical tumor debulking 12/29/2014. Per Dr. Serita Grit office note 01/14/2015 cytoreduction was optimal with residual disease remaining only in the 1 mm implants on the small intestine. 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 ? Cycle 1 adjuvant Taxol/carboplatin 01/20/2015 ? Cycle 2 adjuvant Taxol/carboplatin 02/10/2015 ? Cycle 3 adjuvant Taxol/carboplatin 03/10/2015 ? CA 125 on 1013 2016-42 ? CTs of the chest, abdomen, and pelvis 05/31/2015 with no evidence of progressive ovarian cancer ? CTs of the chest, abdomen, and pelvis 08/07/2015 and 08/08/2015-no evidence of progressive ovarian cancer ? Peritoneal studding noted at the time of the cholecystectomy procedure 08/09/2015 ? Elevated CA 125 10/07/2015 ? CT 10/07/2015 with stricturing at the sigmoid colon, constipation, and omental nodularity ? Initiation of salvage weekly Taxol chemotherapy 10/13/2015 ? Taxol changed to  every 2 weeks beginning 01/19/2016 due to painful neuropathy. 2. COPD 3. Dyspnea secondary to COPD and the large  right pleural effusion, status post therapeutic thoracentesis procedures 09/30/2014,10/09/2014, and 10/21/2014. Left thoracentesis 10/30/2014 4. Left nephrectomy as a child 5. Delayed nausea following cycle 1 Taxol/carboplatin, Aloxi/Emend added with cycle 2 with improvement. 6. Right lower extremity edema, right calf pain 12/09/2014. Negative venous Doppler 12/09/2014. 7. Diffuse pruritus following cycle 1 adjuvant Taxol/carboplatin 01/20/2015, no rash, resolved with a steroid dose pack 8. Thrombocytopenia second to chemotherapy, the carboplatin was dose reduced with cycle 2 adjuvant Taxol/carboplatin 02/10/2015 9. Chemotherapy-induced peripheral neuropathy-painful peripheral neuropathy involving the feet 01/19/2016. Gabapentin resumed. Improved 02/02/2016. 10. Admission with acute cholecystitis 08/07/2015, status post a cholecystectomy 08/09/2015 11. Admission 10/08/2015 with abdominal pain/constipation, improved with bowel rest and laxatives 12. Mild right hydronephrosis noted on the CT 10/07/2015. Renal ultrasound 12/16/2015 with no hydronephrosis noted.   Disposition: Lynn Ramirez appears stable. She is currently on active treatment with Taxol every 2 weeks. The most recent CA-125 was further improved in the normal range. She has developed progressive neuropathy symptoms. We decided to hold today's treatment. She will return in 2 weeks to reevaluate the neuropathy symptoms.  Patient seen with Dr. Benay Spice. 25 minutes were spent face-to-face at today's visit with the majority of that time involved in counseling/coordination of care.    Ned Card ANP/GNP-BC   02/16/2016  11:13 AM This was a shared visit with Ned Card. Lynn Ramirez has developed progressive neuropathy symptoms. We decided to place Taxol on hold.  Julieanne Manson, M.D.

## 2016-02-17 ENCOUNTER — Telehealth: Payer: Self-pay

## 2016-02-17 LAB — CA 125: Cancer Antigen (CA) 125: 30.8 U/mL (ref 0.0–38.1)

## 2016-02-17 NOTE — Telephone Encounter (Signed)
Called and informed pt of ca-125 result. Pt verbalized understanding and appreciative of call. Denies any questions or concerns at this time.

## 2016-02-17 NOTE — Telephone Encounter (Signed)
-----   Message from [name], NP sent at 02/17/2016  8:52 AM EDT ----- Please let her know CA-125 is better.

## 2016-03-01 ENCOUNTER — Ambulatory Visit (HOSPITAL_BASED_OUTPATIENT_CLINIC_OR_DEPARTMENT_OTHER): Payer: Medicare Other

## 2016-03-01 ENCOUNTER — Ambulatory Visit (HOSPITAL_BASED_OUTPATIENT_CLINIC_OR_DEPARTMENT_OTHER): Payer: Medicare Other | Admitting: Oncology

## 2016-03-01 ENCOUNTER — Ambulatory Visit: Payer: Medicare Other

## 2016-03-01 ENCOUNTER — Other Ambulatory Visit (HOSPITAL_BASED_OUTPATIENT_CLINIC_OR_DEPARTMENT_OTHER): Payer: Medicare Other

## 2016-03-01 ENCOUNTER — Telehealth: Payer: Self-pay | Admitting: Oncology

## 2016-03-01 VITALS — BP 154/62 | HR 72 | Temp 97.7°F | Resp 16 | Ht 61.0 in | Wt 189.4 lb

## 2016-03-01 DIAGNOSIS — C569 Malignant neoplasm of unspecified ovary: Secondary | ICD-10-CM

## 2016-03-01 DIAGNOSIS — C5701 Malignant neoplasm of right fallopian tube: Secondary | ICD-10-CM

## 2016-03-01 DIAGNOSIS — Z5111 Encounter for antineoplastic chemotherapy: Secondary | ICD-10-CM

## 2016-03-01 DIAGNOSIS — G620 Drug-induced polyneuropathy: Secondary | ICD-10-CM | POA: Diagnosis not present

## 2016-03-01 DIAGNOSIS — C561 Malignant neoplasm of right ovary: Secondary | ICD-10-CM

## 2016-03-01 DIAGNOSIS — Z95828 Presence of other vascular implants and grafts: Secondary | ICD-10-CM

## 2016-03-01 DIAGNOSIS — C799 Secondary malignant neoplasm of unspecified site: Secondary | ICD-10-CM | POA: Diagnosis not present

## 2016-03-01 DIAGNOSIS — D6959 Other secondary thrombocytopenia: Secondary | ICD-10-CM

## 2016-03-01 DIAGNOSIS — C482 Malignant neoplasm of peritoneum, unspecified: Secondary | ICD-10-CM

## 2016-03-01 LAB — CBC WITH DIFFERENTIAL/PLATELET
BASO%: 0.4 % (ref 0.0–2.0)
BASOS ABS: 0 10*3/uL (ref 0.0–0.1)
EOS ABS: 0.1 10*3/uL (ref 0.0–0.5)
EOS%: 2 % (ref 0.0–7.0)
HCT: 36.9 % (ref 34.8–46.6)
HGB: 11.6 g/dL (ref 11.6–15.9)
LYMPH%: 25.9 % (ref 14.0–49.7)
MCH: 31.4 pg (ref 25.1–34.0)
MCHC: 31.4 g/dL — AB (ref 31.5–36.0)
MCV: 100 fL (ref 79.5–101.0)
MONO#: 0.4 10*3/uL (ref 0.1–0.9)
MONO%: 6.9 % (ref 0.0–14.0)
NEUT#: 3.3 10*3/uL (ref 1.5–6.5)
NEUT%: 64.8 % (ref 38.4–76.8)
Platelets: 251 10*3/uL (ref 145–400)
RBC: 3.69 10*6/uL — ABNORMAL LOW (ref 3.70–5.45)
RDW: 16.4 % — ABNORMAL HIGH (ref 11.2–14.5)
WBC: 5.1 10*3/uL (ref 3.9–10.3)
lymph#: 1.3 10*3/uL (ref 0.9–3.3)

## 2016-03-01 LAB — COMPREHENSIVE METABOLIC PANEL
ALK PHOS: 73 U/L (ref 40–150)
ALT: 15 U/L (ref 0–55)
AST: 15 U/L (ref 5–34)
Albumin: 3.5 g/dL (ref 3.5–5.0)
Anion Gap: 9 mEq/L (ref 3–11)
BUN: 20.4 mg/dL (ref 7.0–26.0)
CALCIUM: 9.9 mg/dL (ref 8.4–10.4)
CHLORIDE: 102 meq/L (ref 98–109)
CO2: 31 mEq/L — ABNORMAL HIGH (ref 22–29)
Creatinine: 0.9 mg/dL (ref 0.6–1.1)
EGFR: 62 mL/min/{1.73_m2} — AB (ref >90)
Glucose: 113 mg/dl (ref 70–140)
POTASSIUM: 4.1 meq/L (ref 3.5–5.1)
SODIUM: 142 meq/L (ref 136–145)
Total Bilirubin: 0.41 mg/dL (ref 0.20–1.20)
Total Protein: 6.8 g/dL (ref 6.4–8.3)

## 2016-03-01 MED ORDER — SODIUM CHLORIDE 0.9 % IV SOLN
80.0000 mg/m2 | Freq: Once | INTRAVENOUS | Status: AC
  Administered 2016-03-01: 150 mg via INTRAVENOUS
  Filled 2016-03-01: qty 25

## 2016-03-01 MED ORDER — SODIUM CHLORIDE 0.9% FLUSH
10.0000 mL | INTRAVENOUS | Status: DC | PRN
  Administered 2016-03-01: 10 mL
  Filled 2016-03-01: qty 10

## 2016-03-01 MED ORDER — SODIUM CHLORIDE 0.9 % IJ SOLN
10.0000 mL | INTRAMUSCULAR | Status: DC | PRN
  Administered 2016-03-01: 10 mL via INTRAVENOUS
  Filled 2016-03-01: qty 10

## 2016-03-01 MED ORDER — HEPARIN SOD (PORK) LOCK FLUSH 100 UNIT/ML IV SOLN
500.0000 [IU] | Freq: Once | INTRAVENOUS | Status: AC | PRN
  Administered 2016-03-01: 500 [IU]
  Filled 2016-03-01: qty 5

## 2016-03-01 MED ORDER — SODIUM CHLORIDE 0.9 % IV SOLN
Freq: Once | INTRAVENOUS | Status: AC
  Administered 2016-03-01: 13:00:00 via INTRAVENOUS

## 2016-03-01 MED ORDER — FAMOTIDINE IN NACL 20-0.9 MG/50ML-% IV SOLN
INTRAVENOUS | Status: AC
  Filled 2016-03-01: qty 50

## 2016-03-01 MED ORDER — DEXAMETHASONE SODIUM PHOSPHATE 100 MG/10ML IJ SOLN
10.0000 mg | Freq: Once | INTRAMUSCULAR | Status: AC
  Administered 2016-03-01: 10 mg via INTRAVENOUS
  Filled 2016-03-01: qty 1

## 2016-03-01 MED ORDER — DIPHENHYDRAMINE HCL 50 MG/ML IJ SOLN
INTRAMUSCULAR | Status: AC
  Filled 2016-03-01: qty 1

## 2016-03-01 MED ORDER — DIPHENHYDRAMINE HCL 50 MG/ML IJ SOLN
25.0000 mg | Freq: Once | INTRAMUSCULAR | Status: AC
  Administered 2016-03-01: 25 mg via INTRAVENOUS

## 2016-03-01 MED ORDER — FAMOTIDINE IN NACL 20-0.9 MG/50ML-% IV SOLN
20.0000 mg | Freq: Once | INTRAVENOUS | Status: AC
  Administered 2016-03-01: 20 mg via INTRAVENOUS

## 2016-03-01 NOTE — Patient Instructions (Signed)
[facility] Discharge Instructions for Patients Receiving Chemotherapy  Today you received the following chemotherapy agents Taxol   To help prevent nausea and vomiting after your treatment, we encourage you to take your nausea medication as directed.   If you develop nausea and vomiting that is not controlled by your nausea medication, call the clinic.   BELOW ARE SYMPTOMS THAT SHOULD BE REPORTED IMMEDIATELY:  *FEVER GREATER THAN 100.5 F  *CHILLS WITH OR WITHOUT FEVER  NAUSEA AND VOMITING THAT IS NOT CONTROLLED WITH YOUR NAUSEA MEDICATION  *UNUSUAL SHORTNESS OF BREATH  *UNUSUAL BRUISING OR BLEEDING  TENDERNESS IN MOUTH AND THROAT WITH OR WITHOUT PRESENCE OF ULCERS  *URINARY PROBLEMS  *BOWEL PROBLEMS  UNUSUAL RASH Items with * indicate a potential emergency and should be followed up as soon as possible.  Feel free to call the clinic you have any questions or concerns. The clinic phone number is [phone].  Please show the CHEMO ALERT CARD at check-in to the Emergency Department and triage nurse.   

## 2016-03-01 NOTE — Telephone Encounter (Signed)
GAVE PATIENT AVS REPORT AND APPOINTMENTS FOR September.  °

## 2016-03-01 NOTE — Progress Notes (Signed)
Lynn Ramirez   Diagnosis: Ovarian cancer  INTERVAL HISTORY:   Lynn Ramirez returns as scheduled. She feels well. Edema in her feet has improved. She has noted improvement in the neuropathy symptoms at the hands. She reports the numbness in fingers and toes is generally unchanged compared to following the initial course of Taxol/carboplatin. She is no longer dropping things. Foot pain has improved.  Objective:  Vital signs in last 24 hours:  Blood pressure (!) 154/62, pulse 72, temperature 97.7 F (36.5 C), temperature source Oral, resp. rate 16, height 5\' 1"  (1.549 m), weight 189 lb 6.4 oz (85.9 kg), SpO2 96 %.    HEENT: No thrush Resp: Distant breath sounds, no respiratory distress Cardio: Regular rate and rhythm GI: No hepatomegaly, no apparent ascites, tender in the right upper abdomen, no mass Vascular: Trace pedal edema bilaterally Neuro: Moderate loss of vibratory sense at the fingertips bilaterally   Portacath/PICC-without erythema  Lab Results:  Lab Results  Component Value Date   WBC 5.1 03/01/2016   HGB 11.6 03/01/2016   HCT 36.9 03/01/2016   MCV 100.0 03/01/2016   PLT 251 03/01/2016   NEUTROABS 3.3 03/01/2016    CEA 125 on 02/16/2016-30.8  Medications: I have reviewed the patient's current medications.  Assessment/Plan: 1. 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 Staging CTs of the chest, abdomen, and pelvis on 10/06/2014 revealed a loculated right pleural effusion, ascites, and omental/mesenteric thickening ? Cytology from peritoneal fluid 10/13/2014 revealed malignant cells consistent with metastatic adenocarcinoma ? 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 Cycle 1 Taxol/carboplatin 10/28/2014 ? Cycle 2 Taxol/carboplatin  11/18/2014 ? Cycle 3 Taxol/carboplatin 12/09/2014 ? CT scan 12/23/2014 with interval improvement in peritoneal carcinomatosis with near-complete resolution of ascites and decreased omental nodularity. Significant improvement in malignant right pleural effusion. ? Status post robotic-assisted laparoscopic hysterectomy with bilateral salpingoophorectomy, omentectomy, radical tumor debulking 12/29/2014. Per Dr. Serita Grit office Ramirez 01/14/2015 cytoreduction was optimal with residual disease remaining only in the 1 mm implants on the small intestine. 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 ? Cycle 1 adjuvant Taxol/carboplatin 01/20/2015 ? Cycle 2 adjuvant Taxol/carboplatin 02/10/2015 ? Cycle 3 adjuvant Taxol/carboplatin 03/10/2015 ? CA 125 on 1013 2016-42 ? CTs of the chest, abdomen, and pelvis 05/31/2015 with no evidence of progressive ovarian cancer ? CTs of the chest, abdomen, and pelvis 08/07/2015 and 08/08/2015-no evidence of progressive ovarian cancer ? Peritoneal studding noted at the time of the cholecystectomy procedure 08/09/2015 ? Elevated CA 125 10/07/2015 ? CT 10/07/2015 with stricturing at the sigmoid colon, constipation, and omental nodularity ? Initiation of salvage weekly Taxol chemotherapy 10/13/2015 ? Taxol changed to every 2 weeks beginning 01/19/2016 due to painful neuropathy. 2. COPD 3. Dyspnea secondary to COPD and the large right pleural effusion, status post therapeutic thoracentesis procedures 09/30/2014,10/09/2014, and 10/21/2014. Left thoracentesis 10/30/2014 4. Left nephrectomy as a child 5. Delayed nausea following cycle 1 Taxol/carboplatin, Aloxi/Emend added with cycle 2 with  improvement. 6. Right lower extremity edema, right calf pain 12/09/2014. Negative venous Doppler 12/09/2014. 7. Diffuse pruritus following cycle 1 adjuvant Taxol/carboplatin 01/20/2015, no rash, resolved with a steroid dose pack 8. Thrombocytopenia second to  chemotherapy, the carboplatin was dose reduced with cycle 2 adjuvant Taxol/carboplatin 02/10/2015 9. Chemotherapy-induced peripheral neuropathy-painful peripheral neuropathy involving the feet 01/19/2016.  10. Admission with acute cholecystitis 08/07/2015, status post a cholecystectomy 08/09/2015 11. Admission 10/08/2015 with abdominal pain/constipation, improved with bowel rest and laxatives 12. Mild right hydronephrosis noted on the CT 10/07/2015. Renal ultrasound 12/16/2015 with no hydronephrosis noted.    Disposition:  The neuropathy symptoms have improved. She would like to resume Taxol. The CA-125 was improved when she was here 2 weeks ago. The plan is to resume Taxol on a 2 week schedule. We will follow her clinical status, the CA 125, and neuropathy symptoms closely.  She will return for an office visit and chemotherapy in 2 weeks.  Betsy Coder, MD  03/01/2016  12:15 PM

## 2016-03-02 ENCOUNTER — Telehealth: Payer: Self-pay | Admitting: *Deleted

## 2016-03-02 LAB — CA 125: CANCER ANTIGEN (CA) 125: 36.4 U/mL (ref 0.0–38.1)

## 2016-03-02 NOTE — Telephone Encounter (Signed)
Left message on voicemail informing pt of stable lab.  

## 2016-03-03 ENCOUNTER — Telehealth: Payer: Self-pay | Admitting: *Deleted

## 2016-03-03 NOTE — Telephone Encounter (Signed)
Received call from pt to obtain CA 19-9 results from 03/01/16.  Pt concerned regarding slight increase in result. Comfort given to pt.  Patient appreciative of assistance and has no questions at this time.

## 2016-03-06 ENCOUNTER — Other Ambulatory Visit: Payer: Self-pay | Admitting: Nurse Practitioner

## 2016-03-06 DIAGNOSIS — C482 Malignant neoplasm of peritoneum, unspecified: Secondary | ICD-10-CM

## 2016-03-07 ENCOUNTER — Other Ambulatory Visit: Payer: Self-pay | Admitting: Oncology

## 2016-03-07 DIAGNOSIS — C482 Malignant neoplasm of peritoneum, unspecified: Secondary | ICD-10-CM

## 2016-03-08 ENCOUNTER — Other Ambulatory Visit: Payer: Self-pay | Admitting: Oncology

## 2016-03-08 DIAGNOSIS — C482 Malignant neoplasm of peritoneum, unspecified: Secondary | ICD-10-CM

## 2016-03-08 NOTE — Telephone Encounter (Signed)
Received refill request for Neurontin. Spoke with pt, she reports she is taking 300 mg QHS for foot pain. Had foot pain x3 days after chemo, resolved now. Numbness in fingers persists, does not interfere with function. Rx sent to pharmacy, per Dr. Benay Spice.

## 2016-03-15 DIAGNOSIS — C569 Malignant neoplasm of unspecified ovary: Secondary | ICD-10-CM | POA: Diagnosis not present

## 2016-03-15 DIAGNOSIS — E559 Vitamin D deficiency, unspecified: Secondary | ICD-10-CM | POA: Diagnosis not present

## 2016-03-15 DIAGNOSIS — E785 Hyperlipidemia, unspecified: Secondary | ICD-10-CM | POA: Diagnosis not present

## 2016-03-15 DIAGNOSIS — R7309 Other abnormal glucose: Secondary | ICD-10-CM | POA: Diagnosis not present

## 2016-03-15 DIAGNOSIS — Z905 Acquired absence of kidney: Secondary | ICD-10-CM | POA: Diagnosis not present

## 2016-03-15 LAB — BASIC METABOLIC PANEL
CREATININE: 1 mg/dL (ref 0.5–1.1)
Glucose: 115 mg/dL
Potassium: 4.3 mmol/L (ref 3.4–5.3)
Sodium: 141 mmol/L (ref 137–147)

## 2016-03-15 LAB — CBC AND DIFFERENTIAL
HEMATOCRIT: 39 % (ref 36–46)
HEMOGLOBIN: 12.3 g/dL (ref 12.0–16.0)
PLATELETS: 262 10*3/uL (ref 150–399)
WBC: 4.2 10^3/mL

## 2016-03-15 LAB — HEMOGLOBIN A1C: HEMOGLOBIN A1C: 5.5

## 2016-03-15 LAB — HEPATIC FUNCTION PANEL
ALK PHOS: 68 U/L (ref 25–125)
ALT: 14 U/L (ref 7–35)
AST: 11 U/L — AB (ref 13–35)
Bilirubin, Total: 0.3 mg/dL

## 2016-03-15 LAB — LIPID PANEL
CHOLESTEROL: 151 mg/dL (ref 0–200)
HDL: 33 mg/dL — AB (ref 35–70)
LDL Cholesterol: 83 mg/dL
TRIGLYCERIDES: 174 mg/dL — AB (ref 40–160)

## 2016-03-15 LAB — TSH: TSH: 2.46 u[IU]/mL (ref 0.41–5.90)

## 2016-03-16 ENCOUNTER — Ambulatory Visit (HOSPITAL_BASED_OUTPATIENT_CLINIC_OR_DEPARTMENT_OTHER): Payer: Medicare Other

## 2016-03-16 ENCOUNTER — Telehealth: Payer: Self-pay | Admitting: Oncology

## 2016-03-16 ENCOUNTER — Telehealth: Payer: Self-pay | Admitting: *Deleted

## 2016-03-16 ENCOUNTER — Ambulatory Visit: Payer: Medicare Other

## 2016-03-16 ENCOUNTER — Ambulatory Visit (HOSPITAL_BASED_OUTPATIENT_CLINIC_OR_DEPARTMENT_OTHER): Payer: Medicare Other | Admitting: Oncology

## 2016-03-16 VITALS — BP 142/64 | HR 77 | Temp 98.3°F | Resp 18 | Ht 61.0 in | Wt 189.3 lb

## 2016-03-16 DIAGNOSIS — D6959 Other secondary thrombocytopenia: Secondary | ICD-10-CM | POA: Diagnosis not present

## 2016-03-16 DIAGNOSIS — C561 Malignant neoplasm of right ovary: Secondary | ICD-10-CM

## 2016-03-16 DIAGNOSIS — J910 Malignant pleural effusion: Secondary | ICD-10-CM

## 2016-03-16 DIAGNOSIS — C482 Malignant neoplasm of peritoneum, unspecified: Secondary | ICD-10-CM

## 2016-03-16 DIAGNOSIS — C799 Secondary malignant neoplasm of unspecified site: Secondary | ICD-10-CM

## 2016-03-16 DIAGNOSIS — C569 Malignant neoplasm of unspecified ovary: Secondary | ICD-10-CM | POA: Diagnosis not present

## 2016-03-16 DIAGNOSIS — Z5111 Encounter for antineoplastic chemotherapy: Secondary | ICD-10-CM | POA: Diagnosis present

## 2016-03-16 DIAGNOSIS — G620 Drug-induced polyneuropathy: Secondary | ICD-10-CM | POA: Diagnosis not present

## 2016-03-16 DIAGNOSIS — C5701 Malignant neoplasm of right fallopian tube: Secondary | ICD-10-CM | POA: Diagnosis not present

## 2016-03-16 DIAGNOSIS — Z95828 Presence of other vascular implants and grafts: Secondary | ICD-10-CM

## 2016-03-16 LAB — CBC WITH DIFFERENTIAL/PLATELET
BASO%: 0.9 % (ref 0.0–2.0)
Basophils Absolute: 0 10*3/uL (ref 0.0–0.1)
EOS%: 2 % (ref 0.0–7.0)
Eosinophils Absolute: 0.1 10*3/uL (ref 0.0–0.5)
HCT: 36.9 % (ref 34.8–46.6)
HEMOGLOBIN: 11.8 g/dL (ref 11.6–15.9)
LYMPH#: 1 10*3/uL (ref 0.9–3.3)
LYMPH%: 26.2 % (ref 14.0–49.7)
MCH: 30.9 pg (ref 25.1–34.0)
MCHC: 32 g/dL (ref 31.5–36.0)
MCV: 96.3 fL (ref 79.5–101.0)
MONO#: 0.3 10*3/uL (ref 0.1–0.9)
MONO%: 8.7 % (ref 0.0–14.0)
NEUT%: 62.2 % (ref 38.4–76.8)
NEUTROS ABS: 2.4 10*3/uL (ref 1.5–6.5)
PLATELETS: 268 10*3/uL (ref 145–400)
RBC: 3.84 10*6/uL (ref 3.70–5.45)
RDW: 17.6 % — AB (ref 11.2–14.5)
WBC: 3.9 10*3/uL (ref 3.9–10.3)

## 2016-03-16 LAB — COMPREHENSIVE METABOLIC PANEL
ALT: 15 U/L (ref 0–55)
ANION GAP: 8 meq/L (ref 3–11)
AST: 14 U/L (ref 5–34)
Albumin: 3.5 g/dL (ref 3.5–5.0)
Alkaline Phosphatase: 82 U/L (ref 40–150)
BILIRUBIN TOTAL: 0.33 mg/dL (ref 0.20–1.20)
BUN: 17.7 mg/dL (ref 7.0–26.0)
CO2: 31 meq/L — AB (ref 22–29)
Calcium: 10 mg/dL (ref 8.4–10.4)
Chloride: 103 mEq/L (ref 98–109)
Creatinine: 0.9 mg/dL (ref 0.6–1.1)
EGFR: 62 mL/min/{1.73_m2} — ABNORMAL LOW (ref >90)
GLUCOSE: 103 mg/dL (ref 70–140)
Potassium: 4 mEq/L (ref 3.5–5.1)
SODIUM: 142 meq/L (ref 136–145)
TOTAL PROTEIN: 7 g/dL (ref 6.4–8.3)

## 2016-03-16 MED ORDER — DIPHENHYDRAMINE HCL 50 MG/ML IJ SOLN
25.0000 mg | Freq: Once | INTRAMUSCULAR | Status: AC
  Administered 2016-03-16: 25 mg via INTRAVENOUS

## 2016-03-16 MED ORDER — SODIUM CHLORIDE 0.9 % IJ SOLN
10.0000 mL | INTRAMUSCULAR | Status: DC | PRN
  Administered 2016-03-16: 10 mL via INTRAVENOUS
  Filled 2016-03-16: qty 10

## 2016-03-16 MED ORDER — SODIUM CHLORIDE 0.9 % IV SOLN
80.0000 mg/m2 | Freq: Once | INTRAVENOUS | Status: AC
  Administered 2016-03-16: 150 mg via INTRAVENOUS
  Filled 2016-03-16: qty 25

## 2016-03-16 MED ORDER — SODIUM CHLORIDE 0.9% FLUSH
10.0000 mL | INTRAVENOUS | Status: DC | PRN
  Administered 2016-03-16: 10 mL
  Filled 2016-03-16: qty 10

## 2016-03-16 MED ORDER — SODIUM CHLORIDE 0.9 % IV SOLN
10.0000 mg | Freq: Once | INTRAVENOUS | Status: AC
  Administered 2016-03-16: 10 mg via INTRAVENOUS
  Filled 2016-03-16: qty 1

## 2016-03-16 MED ORDER — FAMOTIDINE IN NACL 20-0.9 MG/50ML-% IV SOLN
INTRAVENOUS | Status: AC
  Filled 2016-03-16: qty 50

## 2016-03-16 MED ORDER — DIPHENHYDRAMINE HCL 50 MG/ML IJ SOLN
INTRAMUSCULAR | Status: AC
  Filled 2016-03-16: qty 1

## 2016-03-16 MED ORDER — HEPARIN SOD (PORK) LOCK FLUSH 100 UNIT/ML IV SOLN
500.0000 [IU] | Freq: Once | INTRAVENOUS | Status: AC | PRN
  Administered 2016-03-16: 500 [IU]
  Filled 2016-03-16: qty 5

## 2016-03-16 MED ORDER — FAMOTIDINE IN NACL 20-0.9 MG/50ML-% IV SOLN
20.0000 mg | Freq: Once | INTRAVENOUS | Status: AC
  Administered 2016-03-16: 20 mg via INTRAVENOUS

## 2016-03-16 MED ORDER — SODIUM CHLORIDE 0.9 % IV SOLN
Freq: Once | INTRAVENOUS | Status: AC
  Administered 2016-03-16: 13:00:00 via INTRAVENOUS

## 2016-03-16 NOTE — Patient Instructions (Signed)
[facility] Discharge Instructions for Patients Receiving Chemotherapy  Today you received the following chemotherapy agents Taxol   To help prevent nausea and vomiting after your treatment, we encourage you to take your nausea medication as directed.   If you develop nausea and vomiting that is not controlled by your nausea medication, call the clinic.   BELOW ARE SYMPTOMS THAT SHOULD BE REPORTED IMMEDIATELY:  *FEVER GREATER THAN 100.5 F  *CHILLS WITH OR WITHOUT FEVER  NAUSEA AND VOMITING THAT IS NOT CONTROLLED WITH YOUR NAUSEA MEDICATION  *UNUSUAL SHORTNESS OF BREATH  *UNUSUAL BRUISING OR BLEEDING  TENDERNESS IN MOUTH AND THROAT WITH OR WITHOUT PRESENCE OF ULCERS  *URINARY PROBLEMS  *BOWEL PROBLEMS  UNUSUAL RASH Items with * indicate a potential emergency and should be followed up as soon as possible.  Feel free to call the clinic you have any questions or concerns. The clinic phone number is [phone].  Please show the CHEMO ALERT CARD at check-in to the Emergency Department and triage nurse.   

## 2016-03-16 NOTE — Progress Notes (Signed)
[place] OFFICE PROGRESS NOTE   Diagnosis: Ovarian cancer  INTERVAL HISTORY:   Mr. Dirksen returns as scheduled. She completed another treatment with Taxol on 03/01/2016. She reports no change in the peripheral numbness. She has intermittent dyspnea.  Objective:  Vital signs in last 24 hours:  Blood pressure (!) 142/64, pulse 77, temperature 98.3 F (36.8 C), temperature source Oral, resp. rate 18, height 5\' 1"  (1.549 m), weight 189 lb 4.8 oz (85.9 kg), SpO2 98 %.    HEENT: No thrush or ulcers Resp: Lungs clear bilaterally Cardio: Regular rate and rhythm GI: No hepatosplenomegaly, no apparent ascites, no mass Vascular: No leg edema Neuro: Mild decrease in vibratory sense at the fingertips bilaterally   Portacath/PICC-without erythema  Lab Results:  Lab Results  Component Value Date   WBC 3.9 03/16/2016   HGB 11.8 03/16/2016   HCT 36.9 03/16/2016   MCV 96.3 03/16/2016   PLT 268 03/16/2016   NEUTROABS 2.4 03/16/2016     Medications: I have reviewed the patient's current medications.  Assessment/Plan: 1. 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 Staging CTs of the chest, abdomen, and pelvis on 10/06/2014 revealed a loculated right pleural effusion, ascites, and omental/mesenteric thickening ? Cytology from peritoneal fluid 10/13/2014 revealed malignant cells consistent with metastatic adenocarcinoma ? 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 Cycle 1 Taxol/carboplatin 10/28/2014 ? Cycle 2 Taxol/carboplatin 11/18/2014 ? Cycle 3 Taxol/carboplatin 12/09/2014 ? CT scan 12/23/2014 with interval improvement in peritoneal carcinomatosis with near-complete resolution of ascites and decreased omental nodularity. Significant improvement in malignant right pleural  effusion. ? Status post robotic-assisted laparoscopic hysterectomy with bilateral salpingoophorectomy, omentectomy, radical tumor debulking 12/29/2014. Per Dr. Serita Grit office note 01/14/2015 cytoreduction was optimal with residual disease remaining only in the 1 mm implants on the small intestine. 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 ? Cycle 1 adjuvant Taxol/carboplatin 01/20/2015 ? Cycle 2 adjuvant Taxol/carboplatin 02/10/2015 ? Cycle 3 adjuvant Taxol/carboplatin 03/10/2015 ? CA 125 on 1013 2016-42 ? CTs of the chest, abdomen, and pelvis 05/31/2015 with no evidence of progressive ovarian cancer ? CTs of the chest, abdomen, and pelvis 08/07/2015 and 08/08/2015-no evidence of progressive ovarian cancer ? Peritoneal studding noted at the time of the cholecystectomy procedure 08/09/2015 ? Elevated CA 125 10/07/2015 ? CT 10/07/2015 with stricturing at the sigmoid colon, constipation, and omental nodularity ? Initiation of salvage weekly Taxol chemotherapy 10/13/2015 ? Taxol changed to every 2 weeks beginning 01/19/2016 due to painful neuropathy. 2. COPD 3. Dyspnea secondary to COPD and the large right pleural effusion, status post therapeutic thoracentesis procedures 09/30/2014,10/09/2014, and 10/21/2014. Left thoracentesis 10/30/2014 4. Left nephrectomy as a child 5. Delayed nausea following cycle 1 Taxol/carboplatin, Aloxi/Emend added with cycle 2 with improvement. 6. Right lower extremity edema, right calf pain 12/09/2014. Negative venous Doppler 12/09/2014. 7. Diffuse pruritus following cycle 1 adjuvant Taxol/carboplatin 01/20/2015, no rash, resolved with a steroid dose pack 8. Thrombocytopenia second to  chemotherapy, the carboplatin was dose reduced with cycle 2 adjuvant Taxol/carboplatin 02/10/2015 9. Chemotherapy-induced peripheral neuropathy-painful peripheral neuropathy involving the feet 01/19/2016.  10. Admission with acute cholecystitis 08/07/2015, status post a cholecystectomy 08/09/2015 11. Admission 10/08/2015 with abdominal pain/constipation, improved  with bowel rest and laxatives 12. Mild right hydronephrosis noted on the CT 10/07/2015. Renal ultrasound 12/16/2015 with no hydronephrosis noted.   Disposition:  She appears stable. No change in the neuropathy symptoms. The plan is to continue weekly Taxol. She will return for an office visit and Taxol on 03/29/2016.  Betsy Coder, MD  03/16/2016  12:12 PM

## 2016-03-16 NOTE — Telephone Encounter (Signed)
Avs report and  appt schd given per  03/16/16 los. Message sent to chemo scheduler to add chemo for 03/29/16, per los 03/16/16.

## 2016-03-16 NOTE — Patient Instructions (Signed)

## 2016-03-16 NOTE — Telephone Encounter (Signed)
Per LOS I have scheduled appts and notified the ascheduler

## 2016-03-17 ENCOUNTER — Telehealth: Payer: Self-pay | Admitting: *Deleted

## 2016-03-17 LAB — CA 125: CANCER ANTIGEN (CA) 125: 38.4 U/mL — AB (ref 0.0–38.1)

## 2016-03-17 NOTE — Telephone Encounter (Signed)
Message received from patient requesting CA 125 results.  Per Dr. Benay Spice, pt notified that CA 125 results are at the high end of normal and to f/u as scheduled.  Pt appreciative of call back and has no questions or concerns at this time.

## 2016-03-23 DIAGNOSIS — Z1231 Encounter for screening mammogram for malignant neoplasm of breast: Secondary | ICD-10-CM | POA: Diagnosis not present

## 2016-03-23 DIAGNOSIS — J449 Chronic obstructive pulmonary disease, unspecified: Secondary | ICD-10-CM | POA: Diagnosis not present

## 2016-03-23 DIAGNOSIS — E559 Vitamin D deficiency, unspecified: Secondary | ICD-10-CM | POA: Diagnosis not present

## 2016-03-23 DIAGNOSIS — Z905 Acquired absence of kidney: Secondary | ICD-10-CM | POA: Diagnosis not present

## 2016-03-23 DIAGNOSIS — E784 Other hyperlipidemia: Secondary | ICD-10-CM | POA: Diagnosis not present

## 2016-03-23 DIAGNOSIS — E2839 Other primary ovarian failure: Secondary | ICD-10-CM | POA: Diagnosis not present

## 2016-03-23 DIAGNOSIS — C569 Malignant neoplasm of unspecified ovary: Secondary | ICD-10-CM | POA: Diagnosis not present

## 2016-03-23 DIAGNOSIS — C7801 Secondary malignant neoplasm of right lung: Secondary | ICD-10-CM | POA: Diagnosis not present

## 2016-03-23 DIAGNOSIS — I10 Essential (primary) hypertension: Secondary | ICD-10-CM | POA: Diagnosis not present

## 2016-03-23 LAB — HM MAMMOGRAPHY: HM MAMMO: NORMAL (ref 0–4)

## 2016-03-26 ENCOUNTER — Other Ambulatory Visit: Payer: Self-pay | Admitting: Oncology

## 2016-03-29 ENCOUNTER — Telehealth: Payer: Self-pay | Admitting: Nurse Practitioner

## 2016-03-29 ENCOUNTER — Ambulatory Visit (HOSPITAL_BASED_OUTPATIENT_CLINIC_OR_DEPARTMENT_OTHER): Payer: Medicare Other | Admitting: Nurse Practitioner

## 2016-03-29 ENCOUNTER — Other Ambulatory Visit (HOSPITAL_BASED_OUTPATIENT_CLINIC_OR_DEPARTMENT_OTHER): Payer: Medicare Other

## 2016-03-29 ENCOUNTER — Ambulatory Visit: Payer: Medicare Other

## 2016-03-29 ENCOUNTER — Ambulatory Visit (HOSPITAL_BASED_OUTPATIENT_CLINIC_OR_DEPARTMENT_OTHER): Payer: Medicare Other

## 2016-03-29 VITALS — BP 136/71 | HR 92 | Temp 97.7°F | Resp 17 | Ht 61.0 in | Wt 193.0 lb

## 2016-03-29 DIAGNOSIS — C5701 Malignant neoplasm of right fallopian tube: Secondary | ICD-10-CM

## 2016-03-29 DIAGNOSIS — D6959 Other secondary thrombocytopenia: Secondary | ICD-10-CM

## 2016-03-29 DIAGNOSIS — C799 Secondary malignant neoplasm of unspecified site: Secondary | ICD-10-CM | POA: Diagnosis not present

## 2016-03-29 DIAGNOSIS — C561 Malignant neoplasm of right ovary: Secondary | ICD-10-CM | POA: Diagnosis not present

## 2016-03-29 DIAGNOSIS — C7961 Secondary malignant neoplasm of right ovary: Secondary | ICD-10-CM

## 2016-03-29 DIAGNOSIS — C7962 Secondary malignant neoplasm of left ovary: Secondary | ICD-10-CM | POA: Diagnosis not present

## 2016-03-29 DIAGNOSIS — Z5111 Encounter for antineoplastic chemotherapy: Secondary | ICD-10-CM

## 2016-03-29 DIAGNOSIS — G620 Drug-induced polyneuropathy: Secondary | ICD-10-CM | POA: Diagnosis not present

## 2016-03-29 DIAGNOSIS — Z95828 Presence of other vascular implants and grafts: Secondary | ICD-10-CM

## 2016-03-29 DIAGNOSIS — C569 Malignant neoplasm of unspecified ovary: Secondary | ICD-10-CM

## 2016-03-29 DIAGNOSIS — C482 Malignant neoplasm of peritoneum, unspecified: Secondary | ICD-10-CM

## 2016-03-29 DIAGNOSIS — J90 Pleural effusion, not elsewhere classified: Secondary | ICD-10-CM

## 2016-03-29 LAB — CBC WITH DIFFERENTIAL/PLATELET
BASO%: 0.2 % (ref 0.0–2.0)
BASOS ABS: 0 10*3/uL (ref 0.0–0.1)
EOS ABS: 0.1 10*3/uL (ref 0.0–0.5)
EOS%: 2.5 % (ref 0.0–7.0)
HEMATOCRIT: 36.5 % (ref 34.8–46.6)
HEMOGLOBIN: 11.4 g/dL — AB (ref 11.6–15.9)
LYMPH#: 1 10*3/uL (ref 0.9–3.3)
LYMPH%: 21.9 % (ref 14.0–49.7)
MCH: 30.6 pg (ref 25.1–34.0)
MCHC: 31.2 g/dL — ABNORMAL LOW (ref 31.5–36.0)
MCV: 97.9 fL (ref 79.5–101.0)
MONO#: 0.4 10*3/uL (ref 0.1–0.9)
MONO%: 9 % (ref 0.0–14.0)
NEUT#: 2.9 10*3/uL (ref 1.5–6.5)
NEUT%: 66.4 % (ref 38.4–76.8)
PLATELETS: 232 10*3/uL (ref 145–400)
RBC: 3.73 10*6/uL (ref 3.70–5.45)
RDW: 16 % — AB (ref 11.2–14.5)
WBC: 4.3 10*3/uL (ref 3.9–10.3)

## 2016-03-29 MED ORDER — SODIUM CHLORIDE 0.9 % IJ SOLN
10.0000 mL | INTRAMUSCULAR | Status: DC | PRN
  Administered 2016-03-29: 10 mL via INTRAVENOUS
  Filled 2016-03-29: qty 10

## 2016-03-29 MED ORDER — SODIUM CHLORIDE 0.9 % IV SOLN
5.0000 mg | Freq: Once | INTRAVENOUS | Status: AC
  Administered 2016-03-29: 5 mg via INTRAVENOUS
  Filled 2016-03-29: qty 0.5

## 2016-03-29 MED ORDER — HEPARIN SOD (PORK) LOCK FLUSH 100 UNIT/ML IV SOLN
500.0000 [IU] | Freq: Once | INTRAVENOUS | Status: AC | PRN
  Administered 2016-03-29: 500 [IU]
  Filled 2016-03-29: qty 5

## 2016-03-29 MED ORDER — SODIUM CHLORIDE 0.9 % IV SOLN
80.0000 mg/m2 | Freq: Once | INTRAVENOUS | Status: AC
  Administered 2016-03-29: 150 mg via INTRAVENOUS
  Filled 2016-03-29: qty 25

## 2016-03-29 MED ORDER — FAMOTIDINE IN NACL 20-0.9 MG/50ML-% IV SOLN
20.0000 mg | Freq: Once | INTRAVENOUS | Status: AC
  Administered 2016-03-29: 20 mg via INTRAVENOUS

## 2016-03-29 MED ORDER — SODIUM CHLORIDE 0.9% FLUSH
10.0000 mL | INTRAVENOUS | Status: DC | PRN
  Administered 2016-03-29: 10 mL
  Filled 2016-03-29: qty 10

## 2016-03-29 MED ORDER — SODIUM CHLORIDE 0.9 % IV SOLN
Freq: Once | INTRAVENOUS | Status: AC
  Administered 2016-03-29: 13:00:00 via INTRAVENOUS

## 2016-03-29 MED ORDER — FAMOTIDINE IN NACL 20-0.9 MG/50ML-% IV SOLN
INTRAVENOUS | Status: AC
  Filled 2016-03-29: qty 50

## 2016-03-29 MED ORDER — DIPHENHYDRAMINE HCL 50 MG/ML IJ SOLN
INTRAMUSCULAR | Status: AC
  Filled 2016-03-29: qty 1

## 2016-03-29 MED ORDER — DIPHENHYDRAMINE HCL 50 MG/ML IJ SOLN
25.0000 mg | Freq: Once | INTRAMUSCULAR | Status: AC
  Administered 2016-03-29: 25 mg via INTRAVENOUS

## 2016-03-29 NOTE — Patient Instructions (Signed)
[facility] Discharge Instructions for Patients Receiving Chemotherapy  Today you received the following chemotherapy agents Taxol   To help prevent nausea and vomiting after your treatment, we encourage you to take your nausea medication as directed.   If you develop nausea and vomiting that is not controlled by your nausea medication, call the clinic.   BELOW ARE SYMPTOMS THAT SHOULD BE REPORTED IMMEDIATELY:  *FEVER GREATER THAN 100.5 F  *CHILLS WITH OR WITHOUT FEVER  NAUSEA AND VOMITING THAT IS NOT CONTROLLED WITH YOUR NAUSEA MEDICATION  *UNUSUAL SHORTNESS OF BREATH  *UNUSUAL BRUISING OR BLEEDING  TENDERNESS IN MOUTH AND THROAT WITH OR WITHOUT PRESENCE OF ULCERS  *URINARY PROBLEMS  *BOWEL PROBLEMS  UNUSUAL RASH Items with * indicate a potential emergency and should be followed up as soon as possible.  Feel free to call the clinic you have any questions or concerns. The clinic phone number is [phone].  Please show the CHEMO ALERT CARD at check-in to the Emergency Department and triage nurse.   

## 2016-03-29 NOTE — Telephone Encounter (Signed)
Message sent to chemo scheduler to add chemo. Avs report and appointment schedule given to patient,per 03/29/16 los.

## 2016-03-29 NOTE — Progress Notes (Signed)
[place] OFFICE PROGRESS NOTE   Diagnosis:  Ovarian cancer  INTERVAL HISTORY:   Lynn Ramirez returns as scheduled. She continues every 2 week Taxol, last treatment 03/16/2016. She rarely has nausea. No vomiting. No abdominal pain. No mouth sores. No diarrhea. She has a good appetite. She feels neuropathy symptoms are better. She has minimal numbness in the fingertips. Main complaint is fatigue. Legs intermittently feel weak.  Objective:  Vital signs in last 24 hours:  Blood pressure 136/71, pulse 92, temperature 97.7 F (36.5 C), temperature source Oral, resp. rate 17, height 5\' 1"  (1.549 m), weight 193 lb (87.5 kg), SpO2 94 %.    HEENT: No thrush or ulcers. Resp: Lungs clear bilaterally. Cardio: Regular rate and rhythm. GI: Abdomen soft. Mild tenderness surrounding the right abdominal scar. No hepatomegaly. No mass. No apparent ascites. Vascular: No leg edema. Neuro: Vibratory sense intact to mildly decreased over the fingertips per tuning fork exam. Lower extremity motor strength 5 over 5.  Port-A-Cath without erythema.   Lab Results:  Lab Results  Component Value Date   WBC 4.3 03/29/2016   HGB 11.4 (L) 03/29/2016   HCT 36.5 03/29/2016   MCV 97.9 03/29/2016   PLT 232 03/29/2016   NEUTROABS 2.9 03/29/2016    Imaging:  No results found.  Medications: I have reviewed the patient's current medications.  Assessment/Plan: 1. 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 Staging CTs of the chest, abdomen, and pelvis on 10/06/2014 revealed a loculated right pleural effusion, ascites, and omental/mesenteric thickening ? Cytology from peritoneal fluid 10/13/2014 revealed malignant cells consistent with metastatic adenocarcinoma ? Biopsy of an omental mass on 11/02/2014 revealed invasive serous carcinoma with psammoma  bodies ? Cycle 1 Taxol/carboplatin 10/28/2014 ? Cycle 2 Taxol/carboplatin 11/18/2014 ? Cycle 3 Taxol/carboplatin 12/09/2014 ? CT scan 12/23/2014 with interval improvement in peritoneal carcinomatosis with near-complete resolution of ascites and decreased omental nodularity. Significant improvement in malignant right pleural effusion. ? Status post robotic-assisted laparoscopic hysterectomy with bilateral salpingoophorectomy, omentectomy, radical tumor debulking 12/29/2014. Per Dr. Serita Grit office note 01/14/2015 cytoreduction was optimal with residual disease remaining only in the 1 mm implants on the small intestine. 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 ? Cycle 1 adjuvant Taxol/carboplatin 01/20/2015 ? Cycle 2 adjuvant Taxol/carboplatin 02/10/2015 ? Cycle 3 adjuvant Taxol/carboplatin 03/10/2015 ? CA 125 on 1013 2016-42 ? CTs of the chest, abdomen, and pelvis 05/31/2015 with no evidence of progressive ovarian cancer ? CTs of the chest, abdomen, and pelvis 08/07/2015 and 08/08/2015-no evidence of progressive ovarian cancer ? Peritoneal studding noted at the time of the cholecystectomy procedure 08/09/2015 ? Elevated CA 125 10/07/2015 ? CT 10/07/2015 with stricturing at the sigmoid colon, constipation, and omental nodularity ? Initiation of salvage weekly Taxol chemotherapy 10/13/2015 ? Taxol changed to every 2 weeks beginning 01/19/2016 due to painful neuropathy. 2. COPD 3. Dyspnea secondary to COPD and the large right pleural effusion, status post therapeutic thoracentesis procedures 09/30/2014,10/09/2014, and 10/21/2014. Left thoracentesis 10/30/2014 4. Left nephrectomy as a child 5. Delayed nausea  following cycle 1 Taxol/carboplatin, Aloxi/Emend added with cycle 2 with improvement. 6. Right lower extremity edema, right calf pain 12/09/2014. Negative venous Doppler 12/09/2014. 7. Diffuse pruritus following cycle 1 adjuvant Taxol/carboplatin  01/20/2015, no rash, resolved with a steroid dose pack 8. Thrombocytopenia second to chemotherapy, the carboplatin was dose reduced with cycle 2 adjuvant Taxol/carboplatin 02/10/2015 9. Chemotherapy-induced peripheral neuropathy-painful peripheral neuropathy involving the feet 01/19/2016.  10. Admission with acute cholecystitis 08/07/2015, status post a cholecystectomy 08/09/2015 11. Admission 10/08/2015 with abdominal pain/constipation, improved with bowel rest and laxatives 12. Mild right hydronephrosis noted on the CT 10/07/2015. Renal ultrasound 12/16/2015 with no hydronephrosis noted.   Disposition: Lynn Ramirez appears stable. Plan to proceed with Taxol today as scheduled. We will follow-up on the CA-125 from today. She will return for a follow-up visit and Taxol in 2 weeks. She will contact the office in the interim with any problems.  Plan reviewed with Dr. Benay Spice.      Ned Card ANP/GNP-BC   03/29/2016  11:28 AM

## 2016-03-29 NOTE — Patient Instructions (Signed)

## 2016-03-29 NOTE — Progress Notes (Signed)
Per Ned Card NP, okay to use 9-7 CMET results; no CMET needed today.

## 2016-03-30 ENCOUNTER — Telehealth: Payer: Self-pay | Admitting: *Deleted

## 2016-03-30 LAB — CA 125: Cancer Antigen (CA) 125: 39.8 U/mL — ABNORMAL HIGH (ref 0.0–38.1)

## 2016-03-30 NOTE — Telephone Encounter (Signed)
Call received from patient inquiring about her CA-125 results from 03/29/16.  Results given to patient with no questions at this time.  Pt appreciative of information.

## 2016-03-30 NOTE — Telephone Encounter (Signed)
Per LOS I have scheduled appts and notified the scheduler 

## 2016-03-31 ENCOUNTER — Telehealth: Payer: Self-pay | Admitting: *Deleted

## 2016-03-31 NOTE — Telephone Encounter (Signed)
Patient notified of ca125 results on 03/30/16.

## 2016-03-31 NOTE — Telephone Encounter (Signed)
-----   Message from Ladell Pier, MD sent at 03/30/2016  8:44 PM EDT ----- Please call patient, ca125 is stable

## 2016-04-03 ENCOUNTER — Other Ambulatory Visit: Payer: Self-pay | Admitting: Family

## 2016-04-03 DIAGNOSIS — Z1231 Encounter for screening mammogram for malignant neoplasm of breast: Secondary | ICD-10-CM

## 2016-04-04 ENCOUNTER — Other Ambulatory Visit: Payer: Self-pay | Admitting: *Deleted

## 2016-04-04 ENCOUNTER — Ambulatory Visit (INDEPENDENT_AMBULATORY_CARE_PROVIDER_SITE_OTHER): Payer: Medicare Other | Admitting: Emergency Medicine

## 2016-04-04 ENCOUNTER — Encounter: Payer: Self-pay | Admitting: Emergency Medicine

## 2016-04-04 DIAGNOSIS — J449 Chronic obstructive pulmonary disease, unspecified: Secondary | ICD-10-CM | POA: Diagnosis not present

## 2016-04-04 DIAGNOSIS — I2510 Atherosclerotic heart disease of native coronary artery without angina pectoris: Secondary | ICD-10-CM | POA: Diagnosis not present

## 2016-04-04 MED ORDER — BUDESONIDE-FORMOTEROL FUMARATE 160-4.5 MCG/ACT IN AERO: 2.0000 | INHALATION_SPRAY | Freq: Two times a day (BID) | RESPIRATORY_TRACT | 0 refills | Status: DC

## 2016-04-04 NOTE — Patient Instructions (Addendum)
Please continue your Symbicort twice a day  Keep albuterol available to use as needed for shortness of breath We will not restart Spiriva at this time.  Please discuss with Dr Benay Spice the correct timing of getting your flu shot in conjunction with your chemo You can get the pneumovax pneumonia shot.  Follow with Dr Lamonte Sakai in 6 months or sooner if you have any problems

## 2016-04-04 NOTE — Addendum Note (Signed)
Addended by: Mathis Dad on: 04/04/2016 12:45 PM   Modules accepted: Orders

## 2016-04-04 NOTE — Assessment & Plan Note (Signed)
COPD has been relatively stable. She does have exertional dyspnea. She tolerated the discontinuation of Spiriva. She continues Symbicort twice a day. She is about December and asked her to modify her exercise accordingly. She does not have documented exertional hypoxemia. She is due for the flu shot and I'll ask her to get this through oncology to adjust timing based on her Taxol demonstration. She also would qualify for the Pneumovax. Her Prevnar is up-to-date.

## 2016-04-04 NOTE — Progress Notes (Signed)
Subjective:    Patient ID: Lynn Ramirez, female    DOB: 1941/01/29, 75 y.o.   MRN: [id number]  HPI 75 yo woman, has been followed by Dr Gwenette Greet for COPD, pleural effusions in setting of chemotherapy for serous carcinoma possibly Fallopian. History is taken from the patient and from her husband. She completed chemo August, is following with Dr Benay Spice, Dr Denman George. She has been tired, is having some neuropathy. Her breathing is stable. She is on spiriva and symbicort. Uses SABA rarely. She is interested in doing a trial off of spiriva. I personally reviewed her pulmonary function testing from 05/06/2012. This showed severe obstructive lung disease without bronchodilator response.   ROV 05/06/15 -- follow-up visit for COPD and pleural effusions in the setting of serous carcinoma on chemotherapy. At her last visit we tried stopping Spiriva to see if she would tolerate this. She doesn't notice any difference in her breathing off this. Her chemo ended 03/10/15. She still has periods of severe fatigue that can influence her breathing. She remains on Symbicort. She wants the flu shot and Prevnar-13, has now been off chemo x 7 weeks. She underwent cardiac stress testing 10/11 that was reassuring. Also tested her LE perfusion > no abnormality.   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 She was recently started on Taxol as salvage chemotherapy. She is on Symbicort, was started back on Spiriva in mid December and believes that she has probably benefited. Symbicort seems to do the most for her. She does get fatigued w chemo, is associated with some dyspnea. She is able to go to the [facility] and exercise. She rarely uses albuterol. She does not cough often, she does have a hoarse voice recently due to allergies. She is on loratadine now.   ROV 04/04/16 -- this follow-up visit for history of COPD and bilateral pleural effusions, resolved. She  also has a history of ovarian/fallopian carcinoma being treated with chemotherapy. Had difficulty w neuropathy on taxol.   At her last visit in April we did a trial off of Spiriva to see if she would miss this. She continued Symbicort at that time. She was able to tolerate coming off the Spiriva, no significant clinical change. Not needing her SABA. She is planning to go to [facility] in October.     Review of Systems  As per HPI     Objective:   Physical Exam Vitals:   04/04/16 1102  BP: 110/74  Pulse: 86  SpO2: 95%  Weight: 192 lb (87.1 kg)  Height: 5\' 1"  (1.549 m)   Gen: Pleasant, well-nourished, in no distress,  normal affect  ENT: No lesions,  mouth clear,  oropharynx clear, no postnasal drip  Neck: No JVD, no TMG, no carotid bruits  Lungs: No use of accessory muscles, small breaths, clear without rales or rhonchi  Cardiovascular: RRR, heart sounds normal, no murmur or gallops, no peripheral edema  Musculoskeletal: No deformities, no cyanosis or clubbing  Neuro: alert, non focal  Skin: Warm, no lesions or rashes      Assessment & Plan:  COPD (chronic obstructive pulmonary disease) (HCC) COPD has been relatively stable. She does have exertional dyspnea. She tolerated the discontinuation of Spiriva. She continues Symbicort twice a day. She is about December and asked her to modify her exercise accordingly. She does not have documented exertional hypoxemia. She is due for the flu shot and I'll ask her to get this through  oncology to adjust timing based on her Taxol demonstration. She also would qualify for the Pneumovax. Her Prevnar is up-to-date.  Baltazar Apo, MD, PhD 04/04/2016, 11:17 AM [facility] [phone] or if no answer [phone]

## 2016-04-05 DIAGNOSIS — Z1231 Encounter for screening mammogram for malignant neoplasm of breast: Secondary | ICD-10-CM | POA: Diagnosis not present

## 2016-04-05 DIAGNOSIS — Z803 Family history of malignant neoplasm of breast: Secondary | ICD-10-CM | POA: Diagnosis not present

## 2016-04-05 DIAGNOSIS — Z780 Asymptomatic menopausal state: Secondary | ICD-10-CM | POA: Diagnosis not present

## 2016-04-06 ENCOUNTER — Other Ambulatory Visit: Payer: Self-pay | Admitting: Oncology

## 2016-04-08 ENCOUNTER — Other Ambulatory Visit: Payer: Self-pay | Admitting: Oncology

## 2016-04-12 ENCOUNTER — Ambulatory Visit (HOSPITAL_BASED_OUTPATIENT_CLINIC_OR_DEPARTMENT_OTHER): Payer: Medicare Other

## 2016-04-12 ENCOUNTER — Other Ambulatory Visit (HOSPITAL_BASED_OUTPATIENT_CLINIC_OR_DEPARTMENT_OTHER): Payer: Medicare Other

## 2016-04-12 ENCOUNTER — Ambulatory Visit (HOSPITAL_BASED_OUTPATIENT_CLINIC_OR_DEPARTMENT_OTHER): Payer: Medicare Other | Admitting: Oncology

## 2016-04-12 ENCOUNTER — Ambulatory Visit: Payer: Medicare Other

## 2016-04-12 ENCOUNTER — Telehealth: Payer: Self-pay | Admitting: *Deleted

## 2016-04-12 ENCOUNTER — Telehealth: Payer: Self-pay | Admitting: Oncology

## 2016-04-12 VITALS — BP 174/75 | HR 86 | Temp 98.1°F | Resp 18 | Ht 61.0 in | Wt 194.2 lb

## 2016-04-12 DIAGNOSIS — Z95828 Presence of other vascular implants and grafts: Secondary | ICD-10-CM

## 2016-04-12 DIAGNOSIS — C7962 Secondary malignant neoplasm of left ovary: Secondary | ICD-10-CM | POA: Diagnosis not present

## 2016-04-12 DIAGNOSIS — C561 Malignant neoplasm of right ovary: Secondary | ICD-10-CM

## 2016-04-12 DIAGNOSIS — C7961 Secondary malignant neoplasm of right ovary: Secondary | ICD-10-CM

## 2016-04-12 DIAGNOSIS — D6959 Other secondary thrombocytopenia: Secondary | ICD-10-CM | POA: Diagnosis not present

## 2016-04-12 DIAGNOSIS — G620 Drug-induced polyneuropathy: Secondary | ICD-10-CM

## 2016-04-12 DIAGNOSIS — Z5111 Encounter for antineoplastic chemotherapy: Secondary | ICD-10-CM | POA: Diagnosis not present

## 2016-04-12 DIAGNOSIS — C5701 Malignant neoplasm of right fallopian tube: Secondary | ICD-10-CM | POA: Diagnosis not present

## 2016-04-12 DIAGNOSIS — C799 Secondary malignant neoplasm of unspecified site: Secondary | ICD-10-CM | POA: Diagnosis not present

## 2016-04-12 DIAGNOSIS — C482 Malignant neoplasm of peritoneum, unspecified: Secondary | ICD-10-CM

## 2016-04-12 DIAGNOSIS — Z23 Encounter for immunization: Secondary | ICD-10-CM

## 2016-04-12 DIAGNOSIS — C569 Malignant neoplasm of unspecified ovary: Secondary | ICD-10-CM

## 2016-04-12 LAB — CBC WITH DIFFERENTIAL/PLATELET
BASO%: 0.2 % (ref 0.0–2.0)
Basophils Absolute: 0 10*3/uL (ref 0.0–0.1)
EOS ABS: 0.1 10*3/uL (ref 0.0–0.5)
EOS%: 2.1 % (ref 0.0–7.0)
HCT: 36 % (ref 34.8–46.6)
HGB: 11.2 g/dL — ABNORMAL LOW (ref 11.6–15.9)
LYMPH%: 26.8 % (ref 14.0–49.7)
MCH: 30 pg (ref 25.1–34.0)
MCHC: 31.1 g/dL — AB (ref 31.5–36.0)
MCV: 96.5 fL (ref 79.5–101.0)
MONO#: 0.4 10*3/uL (ref 0.1–0.9)
MONO%: 8.9 % (ref 0.0–14.0)
NEUT#: 2.6 10*3/uL (ref 1.5–6.5)
NEUT%: 62 % (ref 38.4–76.8)
PLATELETS: 245 10*3/uL (ref 145–400)
RBC: 3.73 10*6/uL (ref 3.70–5.45)
RDW: 16.4 % — ABNORMAL HIGH (ref 11.2–14.5)
WBC: 4.3 10*3/uL (ref 3.9–10.3)
lymph#: 1.1 10*3/uL (ref 0.9–3.3)

## 2016-04-12 LAB — COMPREHENSIVE METABOLIC PANEL
ALT: 18 U/L (ref 0–55)
ANION GAP: 9 meq/L (ref 3–11)
AST: 13 U/L (ref 5–34)
Albumin: 3.3 g/dL — ABNORMAL LOW (ref 3.5–5.0)
Alkaline Phosphatase: 84 U/L (ref 40–150)
BILIRUBIN TOTAL: 0.34 mg/dL (ref 0.20–1.20)
BUN: 19 mg/dL (ref 7.0–26.0)
CHLORIDE: 102 meq/L (ref 98–109)
CO2: 30 meq/L — AB (ref 22–29)
Calcium: 9.6 mg/dL (ref 8.4–10.4)
Creatinine: 0.9 mg/dL (ref 0.6–1.1)
EGFR: 64 mL/min/{1.73_m2} — AB (ref >90)
Glucose: 112 mg/dl (ref 70–140)
POTASSIUM: 4.1 meq/L (ref 3.5–5.1)
SODIUM: 141 meq/L (ref 136–145)
Total Protein: 6.7 g/dL (ref 6.4–8.3)

## 2016-04-12 LAB — HM DEXA SCAN

## 2016-04-12 MED ORDER — INFLUENZA VAC SPLIT QUAD 0.5 ML IM SUSY
0.5000 mL | PREFILLED_SYRINGE | Freq: Once | INTRAMUSCULAR | Status: AC
  Administered 2016-04-12: 0.5 mL via INTRAMUSCULAR
  Filled 2016-04-12: qty 0.5

## 2016-04-12 MED ORDER — HEPARIN SOD (PORK) LOCK FLUSH 100 UNIT/ML IV SOLN
500.0000 [IU] | Freq: Once | INTRAVENOUS | Status: AC | PRN
  Administered 2016-04-12: 500 [IU]
  Filled 2016-04-12: qty 5

## 2016-04-12 MED ORDER — DIPHENHYDRAMINE HCL 50 MG/ML IJ SOLN
25.0000 mg | Freq: Once | INTRAMUSCULAR | Status: AC
Start: 2016-04-12 — End: 2016-04-12
  Administered 2016-04-12: 25 mg via INTRAVENOUS

## 2016-04-12 MED ORDER — DIPHENHYDRAMINE HCL 50 MG/ML IJ SOLN
INTRAMUSCULAR | Status: AC
  Filled 2016-04-12: qty 1

## 2016-04-12 MED ORDER — SODIUM CHLORIDE 0.9 % IJ SOLN
10.0000 mL | INTRAMUSCULAR | Status: DC | PRN
  Administered 2016-04-12: 10 mL via INTRAVENOUS
  Filled 2016-04-12: qty 10

## 2016-04-12 MED ORDER — SODIUM CHLORIDE 0.9% FLUSH
10.0000 mL | INTRAVENOUS | Status: DC | PRN
  Administered 2016-04-12: 10 mL
  Filled 2016-04-12: qty 10

## 2016-04-12 MED ORDER — SODIUM CHLORIDE 0.9 % IV SOLN
80.0000 mg/m2 | Freq: Once | INTRAVENOUS | Status: AC
  Administered 2016-04-12: 150 mg via INTRAVENOUS
  Filled 2016-04-12: qty 25

## 2016-04-12 MED ORDER — INFLUENZA VAC SPLIT QUAD 0.5 ML IM SUSY
0.5000 mL | PREFILLED_SYRINGE | Freq: Once | INTRAMUSCULAR | Status: DC
  Filled 2016-04-12: qty 0.5

## 2016-04-12 MED ORDER — SODIUM CHLORIDE 0.9 % IV SOLN
Freq: Once | INTRAVENOUS | Status: AC
  Administered 2016-04-12: 12:00:00 via INTRAVENOUS

## 2016-04-12 MED ORDER — FAMOTIDINE IN NACL 20-0.9 MG/50ML-% IV SOLN
20.0000 mg | Freq: Once | INTRAVENOUS | Status: AC
  Administered 2016-04-12: 20 mg via INTRAVENOUS

## 2016-04-12 MED ORDER — SODIUM CHLORIDE 0.9 % IV SOLN
5.0000 mg | Freq: Once | INTRAVENOUS | Status: AC
  Administered 2016-04-12: 5 mg via INTRAVENOUS
  Filled 2016-04-12: qty 0.5

## 2016-04-12 MED ORDER — FAMOTIDINE IN NACL 20-0.9 MG/50ML-% IV SOLN
INTRAVENOUS | Status: AC
  Filled 2016-04-12: qty 50

## 2016-04-12 NOTE — Progress Notes (Signed)
[place] OFFICE PROGRESS NOTE   Diagnosis: Ovarian cancer  INTERVAL HISTORY:   Lynn Ramirez returns as scheduled. She completed another treatment with Taxol on 03/29/2016. She reports no more pain in the feet. No change in numbness. This does not interfere with activity. She would like to discontinue Neurontin. She has varying degrees of dyspnea and malaise. She uses the rescue inhaler occasionally. She is having bowel movements. She is no longer taking MiraLAX secondary to diarrhea.  Lynn Ramirez plans a trip to [place] in 3 weeks.  Objective:  Vital signs in last 24 hours:  Blood pressure (!) 174/75, pulse 86, temperature 98.1 F (36.7 C), temperature source Oral, resp. rate 18, height 5\' 1"  (1.549 m), weight 194 lb 3.2 oz (88.1 kg), SpO2 96 %.    HEENT: No thrush Resp: Distant breath sounds, symmetric air movement, no respiratory distress Cardio: Regular rate and rhythm GI: No hepatomegaly, no apparent ascites, no mass Vascular: No leg edema   Portacath/PICC-without erythema  Lab Results:  Lab Results  Component Value Date   WBC 4.3 04/12/2016   HGB 11.2 (L) 04/12/2016   HCT 36.0 04/12/2016   MCV 96.5 04/12/2016   PLT 245 04/12/2016   NEUTROABS 2.6 04/12/2016   03/29/2016: CA 125-39.8   Medications: I have reviewed the patient's current medications.  Assessment/Plan: 1. 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 Staging CTs of the chest, abdomen, and pelvis on 10/06/2014 revealed a loculated right pleural effusion, ascites, and omental/mesenteric thickening ? Cytology from peritoneal fluid 10/13/2014 revealed malignant cells consistent with metastatic adenocarcinoma ? 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 Cycle 1 Taxol/carboplatin 10/28/2014 ? Cycle  2 Taxol/carboplatin 11/18/2014 ? Cycle 3 Taxol/carboplatin 12/09/2014 ? CT scan 12/23/2014 with interval improvement in peritoneal carcinomatosis with near-complete resolution of ascites and decreased omental nodularity. Significant improvement in malignant right pleural effusion. ? Status post robotic-assisted laparoscopic hysterectomy with bilateral salpingoophorectomy, omentectomy, radical tumor debulking 12/29/2014. Per Dr. Serita Grit office note 01/14/2015 cytoreduction was optimal with residual disease remaining only in the 1 mm implants on the small intestine. 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 ? Cycle 1 adjuvant Taxol/carboplatin 01/20/2015 ? Cycle 2 adjuvant Taxol/carboplatin 02/10/2015 ? Cycle 3 adjuvant Taxol/carboplatin 03/10/2015 ? CA 125 on 1013 2016-42 ? CTs of the chest, abdomen, and pelvis 05/31/2015 with no evidence of progressive ovarian cancer ? CTs of the chest, abdomen, and pelvis 08/07/2015 and 08/08/2015-no evidence of progressive ovarian cancer ? Peritoneal studding noted at the time of the cholecystectomy procedure 08/09/2015 ? Elevated CA 125 10/07/2015 ? CT 10/07/2015 with stricturing at the sigmoid colon, constipation, and omental nodularity ? Initiation of salvage weekly Taxol chemotherapy 10/13/2015 ? Taxol changed to every 2 weeks beginning 01/19/2016 due to painful neuropathy. 2. COPD 3. Dyspnea secondary to COPD and the large right pleural effusion, status post therapeutic thoracentesis procedures 09/30/2014,10/09/2014, and 10/21/2014. Left thoracentesis 10/30/2014 4. Left nephrectomy as a child 5. Delayed nausea following cycle 1 Taxol/carboplatin, Aloxi/Emend added with  cycle 2 with improvement. 6. Right lower extremity edema, right calf pain 12/09/2014. Negative venous Doppler 12/09/2014. 7. Diffuse pruritus following cycle 1 adjuvant Taxol/carboplatin 01/20/2015, no rash, resolved with  a steroid dose pack 8. Thrombocytopenia second to chemotherapy, the carboplatin was dose reduced with cycle 2 adjuvant Taxol/carboplatin 02/10/2015 9. Chemotherapy-induced peripheral neuropathy-painful peripheral neuropathy involving the feet 01/19/2016.  10. Admission with acute cholecystitis 08/07/2015, status post a cholecystectomy 08/09/2015 11. Admission 10/08/2015 with abdominal pain/constipation, improved with bowel rest and laxatives 12. Mild right hydronephrosis noted on the CT 10/07/2015. Renal ultrasound 12/16/2015 with no hydronephrosis noted.   Disposition:  She appears unchanged. The plan is to continue every 2 week Taxol. We will follow-up on the CA 125 from today. She will return for an office visit and chemotherapy in 2 weeks.  Ms. Butchko will receive an influenza vaccine today. We will schedule a restaging CT for a persistent rise in the CA 125.  Betsy Coder, MD  04/12/2016  11:22 AM

## 2016-04-12 NOTE — Telephone Encounter (Signed)
Mrssage sent to chemo scheduler to add chemo. Avs report and  Appointment schedule, given to patient, per 04/12/16 los.

## 2016-04-12 NOTE — Telephone Encounter (Signed)
Per LOS I have scheduled appts and notified the scheduler 

## 2016-04-12 NOTE — Patient Instructions (Signed)
[facility] Discharge Instructions for Patients Receiving Chemotherapy  Today you received the following chemotherapy agents:  Taxol  To help prevent nausea and vomiting after your treatment, we encourage you to take your nausea medication as prescribed.   If you develop nausea and vomiting that is not controlled by your nausea medication, call the clinic.   BELOW ARE SYMPTOMS THAT SHOULD BE REPORTED IMMEDIATELY:  *FEVER GREATER THAN 100.5 F  *CHILLS WITH OR WITHOUT FEVER  NAUSEA AND VOMITING THAT IS NOT CONTROLLED WITH YOUR NAUSEA MEDICATION  *UNUSUAL SHORTNESS OF BREATH  *UNUSUAL BRUISING OR BLEEDING  TENDERNESS IN MOUTH AND THROAT WITH OR WITHOUT PRESENCE OF ULCERS  *URINARY PROBLEMS  *BOWEL PROBLEMS  UNUSUAL RASH Items with * indicate a potential emergency and should be followed up as soon as possible.  Feel free to call the clinic you have any questions or concerns. The clinic phone number is [phone].  Please show the CHEMO ALERT CARD at check-in to the Emergency Department and triage nurse.   

## 2016-04-13 ENCOUNTER — Telehealth: Payer: Self-pay | Admitting: *Deleted

## 2016-04-13 LAB — CA 125: Cancer Antigen (CA) 125: 44 U/mL — ABNORMAL HIGH (ref 0.0–38.1)

## 2016-04-13 NOTE — Telephone Encounter (Signed)
"  I'm calling for the 125 results drawn yesterday.  They usually call in the morning and I'm getting anxious."  Results given at this time.

## 2016-04-24 ENCOUNTER — Other Ambulatory Visit: Payer: Self-pay | Admitting: Oncology

## 2016-04-26 ENCOUNTER — Ambulatory Visit (HOSPITAL_BASED_OUTPATIENT_CLINIC_OR_DEPARTMENT_OTHER): Payer: Medicare Other

## 2016-04-26 ENCOUNTER — Telehealth: Payer: Self-pay | Admitting: *Deleted

## 2016-04-26 ENCOUNTER — Other Ambulatory Visit (HOSPITAL_BASED_OUTPATIENT_CLINIC_OR_DEPARTMENT_OTHER): Payer: Medicare Other

## 2016-04-26 ENCOUNTER — Ambulatory Visit (HOSPITAL_BASED_OUTPATIENT_CLINIC_OR_DEPARTMENT_OTHER): Payer: Medicare Other | Admitting: Nurse Practitioner

## 2016-04-26 ENCOUNTER — Ambulatory Visit: Payer: Medicare Other

## 2016-04-26 VITALS — BP 146/69 | HR 80

## 2016-04-26 DIAGNOSIS — C7961 Secondary malignant neoplasm of right ovary: Secondary | ICD-10-CM | POA: Diagnosis not present

## 2016-04-26 DIAGNOSIS — C5701 Malignant neoplasm of right fallopian tube: Secondary | ICD-10-CM

## 2016-04-26 DIAGNOSIS — C561 Malignant neoplasm of right ovary: Secondary | ICD-10-CM

## 2016-04-26 DIAGNOSIS — C7962 Secondary malignant neoplasm of left ovary: Secondary | ICD-10-CM

## 2016-04-26 DIAGNOSIS — D6959 Other secondary thrombocytopenia: Secondary | ICD-10-CM

## 2016-04-26 DIAGNOSIS — C482 Malignant neoplasm of peritoneum, unspecified: Secondary | ICD-10-CM

## 2016-04-26 DIAGNOSIS — Z5111 Encounter for antineoplastic chemotherapy: Secondary | ICD-10-CM

## 2016-04-26 DIAGNOSIS — Z95828 Presence of other vascular implants and grafts: Secondary | ICD-10-CM

## 2016-04-26 DIAGNOSIS — G620 Drug-induced polyneuropathy: Secondary | ICD-10-CM | POA: Diagnosis not present

## 2016-04-26 DIAGNOSIS — C569 Malignant neoplasm of unspecified ovary: Secondary | ICD-10-CM

## 2016-04-26 DIAGNOSIS — C799 Secondary malignant neoplasm of unspecified site: Secondary | ICD-10-CM

## 2016-04-26 LAB — CBC WITH DIFFERENTIAL/PLATELET
BASO%: 0.3 % (ref 0.0–2.0)
Basophils Absolute: 0 10*3/uL (ref 0.0–0.1)
EOS ABS: 0.1 10*3/uL (ref 0.0–0.5)
EOS%: 2.5 % (ref 0.0–7.0)
HCT: 36.9 % (ref 34.8–46.6)
HEMOGLOBIN: 11.3 g/dL — AB (ref 11.6–15.9)
LYMPH%: 29.4 % (ref 14.0–49.7)
MCH: 29.4 pg (ref 25.1–34.0)
MCHC: 30.6 g/dL — ABNORMAL LOW (ref 31.5–36.0)
MCV: 95.8 fL (ref 79.5–101.0)
MONO#: 0.4 10*3/uL (ref 0.1–0.9)
MONO%: 11.1 % (ref 0.0–14.0)
NEUT%: 56.7 % (ref 38.4–76.8)
NEUTROS ABS: 2.1 10*3/uL (ref 1.5–6.5)
Platelets: 239 10*3/uL (ref 145–400)
RBC: 3.85 10*6/uL (ref 3.70–5.45)
RDW: 16.8 % — AB (ref 11.2–14.5)
WBC: 3.6 10*3/uL — AB (ref 3.9–10.3)
lymph#: 1.1 10*3/uL (ref 0.9–3.3)

## 2016-04-26 LAB — COMPREHENSIVE METABOLIC PANEL
ALBUMIN: 3.3 g/dL — AB (ref 3.5–5.0)
ALK PHOS: 87 U/L (ref 40–150)
ALT: 12 U/L (ref 0–55)
AST: 13 U/L (ref 5–34)
Anion Gap: 8 mEq/L (ref 3–11)
BILIRUBIN TOTAL: 0.23 mg/dL (ref 0.20–1.20)
BUN: 19.3 mg/dL (ref 7.0–26.0)
CO2: 29 mEq/L (ref 22–29)
Calcium: 9.7 mg/dL (ref 8.4–10.4)
Chloride: 104 mEq/L (ref 98–109)
Creatinine: 0.9 mg/dL (ref 0.6–1.1)
EGFR: 66 mL/min/{1.73_m2} — ABNORMAL LOW (ref >90)
GLUCOSE: 116 mg/dL (ref 70–140)
Potassium: 4 mEq/L (ref 3.5–5.1)
SODIUM: 141 meq/L (ref 136–145)
TOTAL PROTEIN: 6.6 g/dL (ref 6.4–8.3)

## 2016-04-26 MED ORDER — SODIUM CHLORIDE 0.9 % IJ SOLN
10.0000 mL | INTRAMUSCULAR | Status: DC | PRN
  Administered 2016-04-26: 10 mL via INTRAVENOUS
  Filled 2016-04-26: qty 10

## 2016-04-26 MED ORDER — FAMOTIDINE IN NACL 20-0.9 MG/50ML-% IV SOLN
INTRAVENOUS | Status: AC
  Filled 2016-04-26: qty 50

## 2016-04-26 MED ORDER — HEPARIN SOD (PORK) LOCK FLUSH 100 UNIT/ML IV SOLN
500.0000 [IU] | Freq: Once | INTRAVENOUS | Status: AC | PRN
Start: 2016-04-26 — End: 2016-04-26
  Administered 2016-04-26: 500 [IU]
  Filled 2016-04-26: qty 5

## 2016-04-26 MED ORDER — SODIUM CHLORIDE 0.9 % IV SOLN
80.0000 mg/m2 | Freq: Once | INTRAVENOUS | Status: AC
  Administered 2016-04-26: 150 mg via INTRAVENOUS
  Filled 2016-04-26: qty 25

## 2016-04-26 MED ORDER — TRAMADOL HCL 50 MG PO TABS: 50.0000 mg | ORAL_TABLET | Freq: Two times a day (BID) | ORAL | 0 refills | Status: DC | PRN

## 2016-04-26 MED ORDER — DEXAMETHASONE SODIUM PHOSPHATE 10 MG/ML IJ SOLN
5.0000 mg | Freq: Once | INTRAMUSCULAR | Status: AC
  Administered 2016-04-26: 5 mg via INTRAVENOUS

## 2016-04-26 MED ORDER — DEXAMETHASONE SODIUM PHOSPHATE 10 MG/ML IJ SOLN
INTRAMUSCULAR | Status: AC
  Filled 2016-04-26: qty 1

## 2016-04-26 MED ORDER — DIPHENHYDRAMINE HCL 50 MG/ML IJ SOLN
INTRAMUSCULAR | Status: AC
  Filled 2016-04-26: qty 1

## 2016-04-26 MED ORDER — SODIUM CHLORIDE 0.9% FLUSH
10.0000 mL | INTRAVENOUS | Status: DC | PRN
  Administered 2016-04-26: 10 mL
  Filled 2016-04-26: qty 10

## 2016-04-26 MED ORDER — DIPHENHYDRAMINE HCL 50 MG/ML IJ SOLN
25.0000 mg | Freq: Once | INTRAMUSCULAR | Status: AC
  Administered 2016-04-26: 25 mg via INTRAVENOUS

## 2016-04-26 MED ORDER — SODIUM CHLORIDE 0.9 % IV SOLN
Freq: Once | INTRAVENOUS | Status: AC
  Administered 2016-04-26: 13:00:00 via INTRAVENOUS

## 2016-04-26 MED ORDER — FAMOTIDINE IN NACL 20-0.9 MG/50ML-% IV SOLN
20.0000 mg | Freq: Once | INTRAVENOUS | Status: AC
  Administered 2016-04-26: 20 mg via INTRAVENOUS

## 2016-04-26 NOTE — Progress Notes (Signed)
[place] OFFICE PROGRESS NOTE   Diagnosis:  Ovarian cancer  INTERVAL HISTORY:   [name] returns as scheduled. She completed another treatment with Taxol 04/12/2016. No change in baseline numbness in the feet and hands. She continues to have "achy" leg pain at nighttime. No nausea or vomiting. Bowels are moving. She had an episode of left groin pain earlier this week. No hip pain.  Objective:  Vital signs in last 24 hours:  Blood pressure (!) 151/66, pulse 82, temperature 98.5 F (36.9 C), temperature source Oral, resp. rate 18, height 5\' 1"  (1.549 m), weight 192 lb 14.4 oz (87.5 kg), SpO2 93 %.    HEENT: No thrush or ulcers. Lymphatics: No palpable inguinal lymph nodes. Resp: Lungs clear bilaterally. Cardio: Regular rate and rhythm. GI: Abdomen soft and nontender. No hepatomegaly. No mass. No apparent ascites. Vascular: No leg edema. Neuro: Vibratory sense mildly to moderately decreased over the fingertips per tuning fork exam.  Port-A-Cath without erythema.   Lab Results:  Lab Results  Component Value Date   WBC 3.6 (L) 04/26/2016   HGB 11.3 (L) 04/26/2016   HCT 36.9 04/26/2016   MCV 95.8 04/26/2016   PLT 239 04/26/2016   NEUTROABS 2.1 04/26/2016    Imaging:  No results found.  Medications: I have reviewed the patient's current medications.  Assessment/Plan: 1. 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 Staging CTs of the chest, abdomen, and pelvis on 10/06/2014 revealed a loculated right pleural effusion, ascites, and omental/mesenteric thickening ? Cytology from peritoneal fluid 10/13/2014 revealed malignant cells consistent with metastatic adenocarcinoma ? 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 Cycle 1 Taxol/carboplatin 10/28/2014 ? Cycle 2  Taxol/carboplatin 11/18/2014 ? Cycle 3 Taxol/carboplatin 12/09/2014 ? CT scan 12/23/2014 with interval improvement in peritoneal carcinomatosis with near-complete resolution of ascites and decreased omental nodularity. Significant improvement in malignant right pleural effusion. ? Status post robotic-assisted laparoscopic hysterectomy with bilateral salpingoophorectomy, omentectomy, radical tumor debulking 12/29/2014. Per Dr. Serita Grit office note 01/14/2015 cytoreduction was optimal with residual disease remaining only in the 1 mm implants on the small intestine. 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 ? Cycle 1 adjuvant Taxol/carboplatin 01/20/2015 ? Cycle 2 adjuvant Taxol/carboplatin 02/10/2015 ? Cycle 3 adjuvant Taxol/carboplatin 03/10/2015 ? CA 125 on 1013 2016-42 ? CTs of the chest, abdomen, and pelvis 05/31/2015 with no evidence of progressive ovarian cancer ? CTs of the chest, abdomen, and pelvis 08/07/2015 and 08/08/2015-no evidence of progressive ovarian cancer ? Peritoneal studding noted at the time of the cholecystectomy procedure 08/09/2015 ? Elevated CA 125 10/07/2015 ? CT 10/07/2015 with stricturing at the sigmoid colon, constipation, and omental nodularity ? Initiation of salvage weekly Taxol chemotherapy 10/13/2015 ? Taxol changed to every 2 weeks beginning 01/19/2016 due to painful neuropathy. 2. COPD 3. Dyspnea secondary to COPD and the large right pleural effusion, status post therapeutic thoracentesis procedures 09/30/2014,10/09/2014, and 10/21/2014. Left thoracentesis 10/30/2014 4. Left nephrectomy as a child 5. Delayed nausea following cycle 1 Taxol/carboplatin, Aloxi/Emend added with cycle  2 with improvement. 6. Right lower extremity edema, right calf pain 12/09/2014. Negative venous Doppler 12/09/2014. 7. Diffuse pruritus following cycle 1 adjuvant Taxol/carboplatin 01/20/2015, no rash, resolved  with a steroid dose pack 8. Thrombocytopenia second to chemotherapy, the carboplatin was dose reduced with cycle 2 adjuvant Taxol/carboplatin 02/10/2015 9. Chemotherapy-induced peripheral neuropathy-painful peripheral neuropathy involving the feet 01/19/2016.  10. Admission with acute cholecystitis 08/07/2015, status post a cholecystectomy 08/09/2015 11. Admission 10/08/2015 with abdominal pain/constipation, improved with bowel rest and laxatives 12. Mild right hydronephrosis noted on the CT 10/07/2015. Renal ultrasound 12/16/2015 with no hydronephrosis noted.   Disposition: Lynn Ramirez appears stable. Plan to proceed with Taxol today as scheduled. We will follow-up on the CA-125 from today. If higher the plan is to obtain restaging CT scans prior to her next visit in 3 weeks.  She will return for a follow-up visit on 05/17/2016. She will contact the office in the interim with any problems.  Plan reviewed with Dr. Benay Spice.    Ned Card ANP/GNP-BC   04/26/2016  11:23 AM

## 2016-04-26 NOTE — Patient Instructions (Signed)
[facility] Discharge Instructions for Patients Receiving Chemotherapy  Today you received the following chemotherapy agents Taxol   To help prevent nausea and vomiting after your treatment, we encourage you to take your nausea medication as directed.   If you develop nausea and vomiting that is not controlled by your nausea medication, call the clinic.   BELOW ARE SYMPTOMS THAT SHOULD BE REPORTED IMMEDIATELY:  *FEVER GREATER THAN 100.5 F  *CHILLS WITH OR WITHOUT FEVER  NAUSEA AND VOMITING THAT IS NOT CONTROLLED WITH YOUR NAUSEA MEDICATION  *UNUSUAL SHORTNESS OF BREATH  *UNUSUAL BRUISING OR BLEEDING  TENDERNESS IN MOUTH AND THROAT WITH OR WITHOUT PRESENCE OF ULCERS  *URINARY PROBLEMS  *BOWEL PROBLEMS  UNUSUAL RASH Items with * indicate a potential emergency and should be followed up as soon as possible.  Feel free to call the clinic you have any questions or concerns. The clinic phone number is [phone].  Please show the CHEMO ALERT CARD at check-in to the Emergency Department and triage nurse.   

## 2016-04-26 NOTE — Telephone Encounter (Signed)
Call from pt requesting Tramadol refill. Stated she was told it would be called to pharmacy. Refilled called in per 10/18 order in chart. Pt aware.

## 2016-04-26 NOTE — Patient Instructions (Signed)

## 2016-04-27 ENCOUNTER — Telehealth: Payer: Self-pay | Admitting: *Deleted

## 2016-04-27 LAB — CA 125: Cancer Antigen (CA) 125: 44.1 U/mL — ABNORMAL HIGH (ref 0.0–38.1)

## 2016-04-27 NOTE — Telephone Encounter (Signed)
Per Dr. Benay Spice, patient notified that ca 125 is stable and to continue with treatment as scheduled.  Patient appreciative of call and has no questions at this time.

## 2016-05-17 ENCOUNTER — Ambulatory Visit (HOSPITAL_BASED_OUTPATIENT_CLINIC_OR_DEPARTMENT_OTHER): Payer: Medicare Other | Admitting: Oncology

## 2016-05-17 ENCOUNTER — Ambulatory Visit (HOSPITAL_BASED_OUTPATIENT_CLINIC_OR_DEPARTMENT_OTHER): Payer: Medicare Other

## 2016-05-17 ENCOUNTER — Other Ambulatory Visit (HOSPITAL_BASED_OUTPATIENT_CLINIC_OR_DEPARTMENT_OTHER): Payer: Medicare Other

## 2016-05-17 ENCOUNTER — Ambulatory Visit: Payer: Medicare Other

## 2016-05-17 ENCOUNTER — Telehealth: Payer: Self-pay | Admitting: Oncology

## 2016-05-17 VITALS — BP 134/67 | HR 92 | Temp 98.0°F | Resp 18 | Ht 61.0 in | Wt 188.6 lb

## 2016-05-17 DIAGNOSIS — J910 Malignant pleural effusion: Secondary | ICD-10-CM | POA: Diagnosis not present

## 2016-05-17 DIAGNOSIS — C569 Malignant neoplasm of unspecified ovary: Secondary | ICD-10-CM

## 2016-05-17 DIAGNOSIS — G620 Drug-induced polyneuropathy: Secondary | ICD-10-CM

## 2016-05-17 DIAGNOSIS — Z5111 Encounter for antineoplastic chemotherapy: Secondary | ICD-10-CM

## 2016-05-17 DIAGNOSIS — C561 Malignant neoplasm of right ovary: Secondary | ICD-10-CM | POA: Diagnosis not present

## 2016-05-17 DIAGNOSIS — C482 Malignant neoplasm of peritoneum, unspecified: Secondary | ICD-10-CM

## 2016-05-17 DIAGNOSIS — C7961 Secondary malignant neoplasm of right ovary: Secondary | ICD-10-CM

## 2016-05-17 DIAGNOSIS — C5701 Malignant neoplasm of right fallopian tube: Secondary | ICD-10-CM

## 2016-05-17 DIAGNOSIS — C7962 Secondary malignant neoplasm of left ovary: Secondary | ICD-10-CM

## 2016-05-17 DIAGNOSIS — Z23 Encounter for immunization: Secondary | ICD-10-CM | POA: Diagnosis not present

## 2016-05-17 DIAGNOSIS — Z95828 Presence of other vascular implants and grafts: Secondary | ICD-10-CM

## 2016-05-17 LAB — COMPREHENSIVE METABOLIC PANEL
ALT: 12 U/L (ref 0–55)
ANION GAP: 9 meq/L (ref 3–11)
AST: 9 U/L (ref 5–34)
Albumin: 3.3 g/dL — ABNORMAL LOW (ref 3.5–5.0)
Alkaline Phosphatase: 85 U/L (ref 40–150)
BILIRUBIN TOTAL: 0.36 mg/dL (ref 0.20–1.20)
BUN: 23.1 mg/dL (ref 7.0–26.0)
CALCIUM: 9.8 mg/dL (ref 8.4–10.4)
CO2: 29 meq/L (ref 22–29)
CREATININE: 0.9 mg/dL (ref 0.6–1.1)
Chloride: 102 mEq/L (ref 98–109)
EGFR: 61 mL/min/{1.73_m2} — ABNORMAL LOW (ref >90)
Glucose: 127 mg/dl (ref 70–140)
Potassium: 3.9 mEq/L (ref 3.5–5.1)
Sodium: 140 mEq/L (ref 136–145)
TOTAL PROTEIN: 7.1 g/dL (ref 6.4–8.3)

## 2016-05-17 LAB — CBC WITH DIFFERENTIAL/PLATELET
BASO%: 0.6 % (ref 0.0–2.0)
Basophils Absolute: 0 10*3/uL (ref 0.0–0.1)
EOS ABS: 0.2 10*3/uL (ref 0.0–0.5)
EOS%: 2.3 % (ref 0.0–7.0)
HEMATOCRIT: 38 % (ref 34.8–46.6)
HEMOGLOBIN: 12.1 g/dL (ref 11.6–15.9)
LYMPH#: 1.2 10*3/uL (ref 0.9–3.3)
LYMPH%: 14.6 % (ref 14.0–49.7)
MCH: 29.3 pg (ref 25.1–34.0)
MCHC: 31.9 g/dL (ref 31.5–36.0)
MCV: 91.9 fL (ref 79.5–101.0)
MONO#: 0.6 10*3/uL (ref 0.1–0.9)
MONO%: 8.2 % (ref 0.0–14.0)
NEUT%: 74.3 % (ref 38.4–76.8)
NEUTROS ABS: 5.9 10*3/uL (ref 1.5–6.5)
PLATELETS: 280 10*3/uL (ref 145–400)
RBC: 4.14 10*6/uL (ref 3.70–5.45)
RDW: 18.6 % — AB (ref 11.2–14.5)
WBC: 7.9 10*3/uL (ref 3.9–10.3)

## 2016-05-17 MED ORDER — DIPHENHYDRAMINE HCL 25 MG PO CAPS
ORAL_CAPSULE | ORAL | Status: AC
  Filled 2016-05-17: qty 1

## 2016-05-17 MED ORDER — DEXAMETHASONE SODIUM PHOSPHATE 10 MG/ML IJ SOLN
INTRAMUSCULAR | Status: AC
  Filled 2016-05-17: qty 1

## 2016-05-17 MED ORDER — PNEUMOCOCCAL VAC POLYVALENT 25 MCG/0.5ML IJ INJ
0.5000 mL | INJECTION | INTRAMUSCULAR | Status: AC
  Administered 2016-05-17: 0.5 mL via INTRAMUSCULAR
  Filled 2016-05-17: qty 0.5

## 2016-05-17 MED ORDER — SODIUM CHLORIDE 0.9 % IV SOLN
Freq: Once | INTRAVENOUS | Status: AC
  Administered 2016-05-17: 10:00:00 via INTRAVENOUS

## 2016-05-17 MED ORDER — HEPARIN SOD (PORK) LOCK FLUSH 100 UNIT/ML IV SOLN
500.0000 [IU] | Freq: Once | INTRAVENOUS | Status: AC | PRN
  Administered 2016-05-17: 500 [IU]
  Filled 2016-05-17: qty 5

## 2016-05-17 MED ORDER — DIPHENHYDRAMINE HCL 50 MG/ML IJ SOLN
25.0000 mg | Freq: Once | INTRAMUSCULAR | Status: AC
  Administered 2016-05-17: 25 mg via INTRAVENOUS

## 2016-05-17 MED ORDER — DEXAMETHASONE SODIUM PHOSPHATE 10 MG/ML IJ SOLN
5.0000 mg | Freq: Once | INTRAMUSCULAR | Status: AC
  Administered 2016-05-17: 5 mg via INTRAVENOUS

## 2016-05-17 MED ORDER — SODIUM CHLORIDE 0.9% FLUSH
10.0000 mL | INTRAVENOUS | Status: DC | PRN
  Administered 2016-05-17: 10 mL
  Filled 2016-05-17: qty 10

## 2016-05-17 MED ORDER — SODIUM CHLORIDE 0.9 % IJ SOLN
10.0000 mL | INTRAMUSCULAR | Status: DC | PRN
  Administered 2016-05-17: 10 mL via INTRAVENOUS
  Filled 2016-05-17: qty 10

## 2016-05-17 MED ORDER — FAMOTIDINE IN NACL 20-0.9 MG/50ML-% IV SOLN
20.0000 mg | Freq: Once | INTRAVENOUS | Status: AC
  Administered 2016-05-17: 20 mg via INTRAVENOUS

## 2016-05-17 MED ORDER — FAMOTIDINE IN NACL 20-0.9 MG/50ML-% IV SOLN
INTRAVENOUS | Status: AC
  Filled 2016-05-17: qty 50

## 2016-05-17 MED ORDER — DEXTROSE 5 % IV SOLN
80.0000 mg/m2 | Freq: Once | INTRAVENOUS | Status: AC
  Administered 2016-05-17: 150 mg via INTRAVENOUS
  Filled 2016-05-17: qty 25

## 2016-05-17 MED ORDER — DIPHENHYDRAMINE HCL 50 MG/ML IJ SOLN
INTRAMUSCULAR | Status: AC
Start: 2016-05-17 — End: 2016-05-17
  Filled 2016-05-17: qty 1

## 2016-05-17 NOTE — Patient Instructions (Signed)

## 2016-05-17 NOTE — Progress Notes (Signed)
[facility] OFFICE PROGRESS NOTE   Diagnosis: Ovarian cancer  INTERVAL HISTORY:   Mr. Thielen returns as scheduled. She was last treated with Taxol 04/26/2016. She reports stable peripheral neuropathy symptoms. This does not interfere with activity. She has noted intermittent numbness and tingling in the right upper arm for the past several weeks. This occurs several times per day. No neck pain. No arm or hand weakness. She took a trip to [place]. She had increased dyspnea while in [place].  Objective:  Vital signs in last 24 hours:  Blood pressure 134/67, pulse 92, temperature 98 F (36.7 C), resp. rate 18, height 5\' 1"  (1.549 m), weight 188 lb 9.6 oz (85.5 kg), SpO2 93 %.    HEENT: No thrush Resp: Distant breath sounds, scattered inspiratory rhonchi, no respiratory distress Cardio: Regular rate and rhythm GI: No hepatosplenomegaly, nondistended Vascular: No leg edema, no swelling of the right arm Neuro: The motor exam appears intact in the arm and hands bilaterally.     Portacath/PICC-without erythema  Lab Results:  Lab Results  Component Value Date   WBC 7.9 05/17/2016   HGB 12.1 05/17/2016   HCT 38.0 05/17/2016   MCV 91.9 05/17/2016   PLT 280 05/17/2016   NEUTROABS 5.9 05/17/2016   04/26/2016: CA 125-44.1  Medications: I have reviewed the patient's current medications.  Assessment/Plan: 1. 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 Staging CTs of the chest, abdomen, and pelvis on 10/06/2014 revealed a loculated right pleural effusion, ascites, and omental/mesenteric thickening ? Cytology from peritoneal fluid 10/13/2014 revealed malignant cells consistent with metastatic adenocarcinoma ? 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 Cycle 1 Taxol/carboplatin  10/28/2014 ? Cycle 2 Taxol/carboplatin 11/18/2014 ? Cycle 3 Taxol/carboplatin 12/09/2014 ? CT scan 12/23/2014 with interval improvement in peritoneal carcinomatosis with near-complete resolution of ascites and decreased omental nodularity. Significant improvement in malignant right pleural effusion. ? Status post robotic-assisted laparoscopic hysterectomy with bilateral salpingoophorectomy, omentectomy, radical tumor debulking 12/29/2014. Per Dr. Serita Grit office note 01/14/2015 cytoreduction was optimal with residual disease remaining only in the 1 mm implants on the small intestine. 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 ? Cycle 1 adjuvant Taxol/carboplatin 01/20/2015 ? Cycle 2 adjuvant Taxol/carboplatin 02/10/2015 ? Cycle 3 adjuvant Taxol/carboplatin 03/10/2015 ? CA 125 on 1013 2016-42 ? CTs of the chest, abdomen, and pelvis 05/31/2015 with no evidence of progressive ovarian cancer ? CTs of the chest, abdomen, and pelvis 08/07/2015 and 08/08/2015-no evidence of progressive ovarian cancer ? Peritoneal studding noted at the time of the cholecystectomy procedure 08/09/2015 ? Elevated CA 125 10/07/2015 ? CT 10/07/2015 with stricturing at the sigmoid colon, constipation, and omental nodularity ? Initiation of salvage weekly Taxol chemotherapy 10/13/2015 ? Taxol changed to every 2 weeks beginning 01/19/2016 due to painful neuropathy. 2. COPD 3. Dyspnea secondary to COPD and the large right pleural effusion, status post therapeutic thoracentesis procedures 09/30/2014,10/09/2014, and 10/21/2014. Left thoracentesis 10/30/2014 4. Left nephrectomy as a child 5. Delayed nausea following cycle 1 Taxol/carboplatin,  Aloxi/Emend added with cycle 2 with improvement. 6. Right lower extremity edema, right calf pain 12/09/2014. Negative venous Doppler 12/09/2014. 7. Diffuse pruritus following cycle 1 adjuvant Taxol/carboplatin 01/20/2015, no rash,  resolved with a steroid dose pack 8. Thrombocytopenia second to chemotherapy, the carboplatin was dose reduced with cycle 2 adjuvant Taxol/carboplatin 02/10/2015 9. Chemotherapy-induced peripheral neuropathy-painful peripheral neuropathy involving the feet 01/19/2016.  10. Admission with acute cholecystitis 08/07/2015, status post a cholecystectomy 08/09/2015 11. Admission 10/08/2015 with abdominal pain/constipation, improved with bowel rest and laxatives 12. Mild right hydronephrosis noted on the CT 10/07/2015. Renal ultrasound 12/16/2015 with no hydronephrosis noted.    Disposition:  Her overall status appears unchanged. The plan is to continue weekly Taxol unless there is clear evidence of disease progression. We will consider adding Avastin at the time of progression. We reviewed the potential toxicities associated with Avastin.  She will complete another treatment with Taxol today. She will return for an office visit and chemotherapy in 2 weeks.  The etiology of the right arm numbness/tingling is unclear. I doubt this is related to Taxol neuropathy. She has no symptoms to suggest a radicular process from the neck. We will consider imaging of the cervical spine and brain if her symptoms persist.   She will receive a 23 valent pneumonia vaccine today.  Betsy Coder, MD  05/17/2016  9:53 AM

## 2016-05-17 NOTE — Patient Instructions (Signed)
[facility] Discharge Instructions for Patients Receiving Chemotherapy  Today you received the following chemotherapy agents Taxol   To help prevent nausea and vomiting after your treatment, we encourage you to take your nausea medication as directed.   If you develop nausea and vomiting that is not controlled by your nausea medication, call the clinic.   BELOW ARE SYMPTOMS THAT SHOULD BE REPORTED IMMEDIATELY:  *FEVER GREATER THAN 100.5 F  *CHILLS WITH OR WITHOUT FEVER  NAUSEA AND VOMITING THAT IS NOT CONTROLLED WITH YOUR NAUSEA MEDICATION  *UNUSUAL SHORTNESS OF BREATH  *UNUSUAL BRUISING OR BLEEDING  TENDERNESS IN MOUTH AND THROAT WITH OR WITHOUT PRESENCE OF ULCERS  *URINARY PROBLEMS  *BOWEL PROBLEMS  UNUSUAL RASH Items with * indicate a potential emergency and should be followed up as soon as possible.  Feel free to call the clinic you have any questions or concerns. The clinic phone number is [phone].  Please show the CHEMO ALERT CARD at check-in to the Emergency Department and triage nurse.   

## 2016-05-17 NOTE — Telephone Encounter (Signed)
Message sent to chemo scheduler to add chemo. Appointments scheduled per 05/17/16 los. AVS report and appointment schedule was given to patient, per 05/17/16 los.

## 2016-05-18 ENCOUNTER — Telehealth: Payer: Self-pay | Admitting: *Deleted

## 2016-05-18 LAB — CA 125: Cancer Antigen (CA) 125: 50.1 U/mL — ABNORMAL HIGH (ref 0.0–38.1)

## 2016-05-18 NOTE — Telephone Encounter (Signed)
Call received from patient requesting ca-125 results.  Per Dr. Benay Spice, patient notified that results are mildly elevated and that he will not add Avastin at this time.  Ca-125 results reviewed with patient per her request.  Patient has no further questions at this time and is appreciative of information.

## 2016-05-28 ENCOUNTER — Other Ambulatory Visit: Payer: Self-pay | Admitting: Oncology

## 2016-05-31 ENCOUNTER — Ambulatory Visit (HOSPITAL_BASED_OUTPATIENT_CLINIC_OR_DEPARTMENT_OTHER): Payer: Medicare Other

## 2016-05-31 ENCOUNTER — Other Ambulatory Visit (HOSPITAL_BASED_OUTPATIENT_CLINIC_OR_DEPARTMENT_OTHER): Payer: Medicare Other

## 2016-05-31 ENCOUNTER — Telehealth: Payer: Self-pay | Admitting: Oncology

## 2016-05-31 ENCOUNTER — Ambulatory Visit: Payer: Medicare Other

## 2016-05-31 ENCOUNTER — Ambulatory Visit (HOSPITAL_BASED_OUTPATIENT_CLINIC_OR_DEPARTMENT_OTHER): Payer: Medicare Other | Admitting: Oncology

## 2016-05-31 VITALS — BP 127/64 | HR 76 | Temp 97.6°F | Resp 20

## 2016-05-31 VITALS — BP 140/65 | HR 78 | Temp 98.2°F | Resp 18 | Wt 192.2 lb

## 2016-05-31 DIAGNOSIS — C5701 Malignant neoplasm of right fallopian tube: Secondary | ICD-10-CM

## 2016-05-31 DIAGNOSIS — C7962 Secondary malignant neoplasm of left ovary: Secondary | ICD-10-CM

## 2016-05-31 DIAGNOSIS — D6959 Other secondary thrombocytopenia: Secondary | ICD-10-CM

## 2016-05-31 DIAGNOSIS — C7961 Secondary malignant neoplasm of right ovary: Secondary | ICD-10-CM | POA: Diagnosis not present

## 2016-05-31 DIAGNOSIS — G620 Drug-induced polyneuropathy: Secondary | ICD-10-CM

## 2016-05-31 DIAGNOSIS — C482 Malignant neoplasm of peritoneum, unspecified: Secondary | ICD-10-CM

## 2016-05-31 DIAGNOSIS — Z95828 Presence of other vascular implants and grafts: Secondary | ICD-10-CM

## 2016-05-31 DIAGNOSIS — C561 Malignant neoplasm of right ovary: Secondary | ICD-10-CM

## 2016-05-31 DIAGNOSIS — C569 Malignant neoplasm of unspecified ovary: Secondary | ICD-10-CM

## 2016-05-31 DIAGNOSIS — Z5111 Encounter for antineoplastic chemotherapy: Secondary | ICD-10-CM

## 2016-05-31 LAB — CBC WITH DIFFERENTIAL/PLATELET
BASO%: 0.7 % (ref 0.0–2.0)
Basophils Absolute: 0 10*3/uL (ref 0.0–0.1)
EOS%: 5.5 % (ref 0.0–7.0)
Eosinophils Absolute: 0.3 10*3/uL (ref 0.0–0.5)
HCT: 36.8 % (ref 34.8–46.6)
HGB: 11.3 g/dL — ABNORMAL LOW (ref 11.6–15.9)
LYMPH%: 17.5 % (ref 14.0–49.7)
MCH: 28.9 pg (ref 25.1–34.0)
MCHC: 30.7 g/dL — AB (ref 31.5–36.0)
MCV: 94.1 fL (ref 79.5–101.0)
MONO#: 0.4 10*3/uL (ref 0.1–0.9)
MONO%: 9.7 % (ref 0.0–14.0)
NEUT#: 3 10*3/uL (ref 1.5–6.5)
NEUT%: 66.6 % (ref 38.4–76.8)
PLATELETS: 248 10*3/uL (ref 145–400)
RBC: 3.91 10*6/uL (ref 3.70–5.45)
RDW: 17.3 % — ABNORMAL HIGH (ref 11.2–14.5)
WBC: 4.5 10*3/uL (ref 3.9–10.3)
lymph#: 0.8 10*3/uL — ABNORMAL LOW (ref 0.9–3.3)

## 2016-05-31 MED ORDER — SODIUM CHLORIDE 0.9% FLUSH
10.0000 mL | INTRAVENOUS | Status: DC | PRN
  Administered 2016-05-31: 10 mL
  Filled 2016-05-31: qty 10

## 2016-05-31 MED ORDER — DIPHENHYDRAMINE HCL 50 MG/ML IJ SOLN
25.0000 mg | Freq: Once | INTRAMUSCULAR | Status: AC
  Administered 2016-05-31: 25 mg via INTRAVENOUS

## 2016-05-31 MED ORDER — FAMOTIDINE IN NACL 20-0.9 MG/50ML-% IV SOLN
INTRAVENOUS | Status: AC
Start: 2016-05-31 — End: 2016-05-31
  Filled 2016-05-31: qty 50

## 2016-05-31 MED ORDER — SODIUM CHLORIDE 0.9 % IJ SOLN
10.0000 mL | INTRAMUSCULAR | Status: DC | PRN
  Administered 2016-05-31: 10 mL via INTRAVENOUS
  Filled 2016-05-31: qty 10

## 2016-05-31 MED ORDER — FAMOTIDINE IN NACL 20-0.9 MG/50ML-% IV SOLN
20.0000 mg | Freq: Once | INTRAVENOUS | Status: AC
  Administered 2016-05-31: 20 mg via INTRAVENOUS

## 2016-05-31 MED ORDER — DIPHENHYDRAMINE HCL 50 MG/ML IJ SOLN
INTRAMUSCULAR | Status: AC
  Filled 2016-05-31: qty 1

## 2016-05-31 MED ORDER — DEXTROSE 5 % IV SOLN
80.0000 mg/m2 | Freq: Once | INTRAVENOUS | Status: AC
  Administered 2016-05-31: 150 mg via INTRAVENOUS
  Filled 2016-05-31: qty 25

## 2016-05-31 MED ORDER — DEXAMETHASONE SODIUM PHOSPHATE 10 MG/ML IJ SOLN
5.0000 mg | Freq: Once | INTRAMUSCULAR | Status: AC
  Administered 2016-05-31: 5 mg via INTRAVENOUS

## 2016-05-31 MED ORDER — HEPARIN SOD (PORK) LOCK FLUSH 100 UNIT/ML IV SOLN
500.0000 [IU] | Freq: Once | INTRAVENOUS | Status: AC | PRN
  Administered 2016-05-31: 500 [IU]
  Filled 2016-05-31: qty 5

## 2016-05-31 MED ORDER — SODIUM CHLORIDE 0.9 % IV SOLN
Freq: Once | INTRAVENOUS | Status: AC
  Administered 2016-05-31: 11:00:00 via INTRAVENOUS

## 2016-05-31 MED ORDER — DEXAMETHASONE SODIUM PHOSPHATE 10 MG/ML IJ SOLN
INTRAMUSCULAR | Status: AC
  Filled 2016-05-31: qty 1

## 2016-05-31 NOTE — Telephone Encounter (Signed)
Appointments scheduled per 11/22 LOS. Patient given AVS report and calendars with future scheduled appointments. °

## 2016-05-31 NOTE — Progress Notes (Signed)
OK to treat with metabolic labs of [id number] per Dr. Benay Spice.

## 2016-05-31 NOTE — Progress Notes (Addendum)
[facility] OFFICE PROGRESS NOTE   Diagnosis: Ovarian cancer  INTERVAL HISTORY:   [name] returns as scheduled. She reports stable neuropathy symptoms. She has pain in the knees. She noted increased bilateral leg swelling today. Stable dyspnea.  Objective:  Vital signs in last 24 hours:  Blood pressure 140/65, pulse 78, temperature 98.2 F (36.8 C), temperature source Oral, resp. rate 18, weight 192 lb 3.2 oz (87.2 kg), SpO2 95 %.    HEENT: No thrush Resp: Distant breath sounds bilaterally, no respiratory distress Cardio: Regular 8 and rhythm GI: No hepatomegaly, no mass, mild tenderness in the left mid abdomen Vascular: Trace pitting edema at the lower leg bilaterally  Portacath/PICC-without erythema  Lab Results:  Lab Results  Component Value Date   WBC 4.5 05/31/2016   HGB 11.3 (L) 05/31/2016   HCT 36.8 05/31/2016   MCV 94.1 05/31/2016   PLT 248 05/31/2016   NEUTROABS 3.0 05/31/2016   05/17/2016: CA 125-50.1  Medications: I have reviewed the patient's current medications.  Assessment/Plan 1. 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, immunohistochemical profile consistent with a GYN primary, elevated CA 125              Staging CTs of the chest, abdomen, and pelvis on 10/06/2014 revealed a loculated right pleural effusion, ascites, and omental/mesenteric thickening  Cytology from peritoneal fluid 10/13/2014 revealed malignant cells consistent with metastatic adenocarcinoma Biopsy of an omental mass on 11/02/2014 revealed invasive serous carcinoma with psammoma bodies Cycle 1 Taxol/carboplatin 10/28/2014 Cycle 2 Taxol/carboplatin 11/18/2014 Cycle 3 Taxol/carboplatin 12/09/2014 CT scan 12/23/2014 with interval improvement in peritoneal carcinomatosis with near-complete resolution of ascites and decreased omental nodularity. Significant improvement in malignant right pleural  effusion. Status post robotic-assisted laparoscopic hysterectomy with bilateral salpingoophorectomy, omentectomy, radical tumor debulking 12/29/2014. Per Dr. Serita Grit office note 01/14/2015 cytoreduction was optimal with residual disease remaining only in the 1 mm implants on the small intestine. 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 Cycle 1 adjuvant Taxol/carboplatin 01/20/2015 Cycle 2 adjuvant Taxol/carboplatin 02/10/2015 Cycle 3 adjuvant Taxol/carboplatin 03/10/2015 CA 125 on 1013 2016-42 CTs of the chest, abdomen, and pelvis 05/31/2015 with no evidence of progressive ovarian cancer CTs of the chest, abdomen, and pelvis 08/07/2015 and 08/08/2015-no evidence of progressive ovarian cancer Peritoneal studding noted at the time of the cholecystectomy procedure 08/09/2015 Elevated CA 125 10/07/2015 CT 10/07/2015 with stricturing at the sigmoid colon, constipation, and omental nodularity Initiation of salvage weekly Taxol chemotherapy 10/13/2015 Taxol changed to every 2 weeks beginning 01/19/2016 due to painful neuropathy. 2. COPD 3. Dyspnea secondary to COPD and the large right pleural effusion, status post therapeutic thoracentesis procedures 09/30/2014,10/09/2014, and 10/21/2014. Left thoracentesis 10/30/2014 4. Left nephrectomy as a child 5. Delayed nausea following cycle 1 Taxol/carboplatin, Aloxi/Emend added with cycle 2 with improvement. 6. Right lower extremity edema, right calf pain 12/09/2014. Negative venous Doppler 12/09/2014. 7. Diffuse pruritus following cycle 1 adjuvant Taxol/carboplatin 01/20/2015, no rash, resolved with a steroid dose pack 8. Thrombocytopenia second to chemotherapy, the  carboplatin was dose reduced with cycle 2 adjuvant Taxol/carboplatin 02/10/2015 9. Chemotherapy-induced peripheral neuropathy-painful peripheral neuropathy involving the feet 01/19/2016.  10. Admission with acute cholecystitis 08/07/2015, status post a cholecystectomy 08/09/2015 11. Admission 10/08/2015 with abdominal pain/constipation, improved with bowel rest and laxatives 12. Mild right  hydronephrosis noted on the CT 10/07/2015. Renal ultrasound 12/16/2015 with no hydronephrosis noted.   Disposition:  [name] appears unchanged. The CA-125 has not changed significantly over the past few months. The plan is to continue every 2 week Taxol. We will add Avastin for a consistent rise in the CA-125 or clinical progression.  She will return for an office visit and chemotherapy in 2 weeks.  Betsy Coder, MD  05/31/2016  10:50 AM

## 2016-06-01 LAB — CA 125: CANCER ANTIGEN (CA) 125: 50.1 U/mL — AB (ref 0.0–38.1)

## 2016-06-02 ENCOUNTER — Telehealth: Payer: Self-pay | Admitting: *Deleted

## 2016-06-02 NOTE — Telephone Encounter (Signed)
Per LOS I have scheduled appts and notified the scheruler 

## 2016-06-11 ENCOUNTER — Other Ambulatory Visit: Payer: Self-pay | Admitting: Oncology

## 2016-06-14 ENCOUNTER — Ambulatory Visit (HOSPITAL_BASED_OUTPATIENT_CLINIC_OR_DEPARTMENT_OTHER): Payer: Medicare Other

## 2016-06-14 ENCOUNTER — Other Ambulatory Visit: Payer: Self-pay | Admitting: *Deleted

## 2016-06-14 ENCOUNTER — Ambulatory Visit: Payer: Medicare Other

## 2016-06-14 ENCOUNTER — Telehealth: Payer: Self-pay | Admitting: Oncology

## 2016-06-14 ENCOUNTER — Ambulatory Visit (HOSPITAL_BASED_OUTPATIENT_CLINIC_OR_DEPARTMENT_OTHER): Payer: Medicare Other | Admitting: Oncology

## 2016-06-14 ENCOUNTER — Telehealth: Payer: Self-pay | Admitting: *Deleted

## 2016-06-14 ENCOUNTER — Other Ambulatory Visit (HOSPITAL_BASED_OUTPATIENT_CLINIC_OR_DEPARTMENT_OTHER): Payer: Medicare Other

## 2016-06-14 VITALS — BP 148/71 | HR 86 | Temp 97.3°F | Resp 18 | Ht 61.0 in | Wt 189.2 lb

## 2016-06-14 DIAGNOSIS — D6959 Other secondary thrombocytopenia: Secondary | ICD-10-CM

## 2016-06-14 DIAGNOSIS — Z5111 Encounter for antineoplastic chemotherapy: Secondary | ICD-10-CM | POA: Diagnosis not present

## 2016-06-14 DIAGNOSIS — C561 Malignant neoplasm of right ovary: Secondary | ICD-10-CM

## 2016-06-14 DIAGNOSIS — Z95828 Presence of other vascular implants and grafts: Secondary | ICD-10-CM

## 2016-06-14 DIAGNOSIS — C5701 Malignant neoplasm of right fallopian tube: Secondary | ICD-10-CM

## 2016-06-14 DIAGNOSIS — C482 Malignant neoplasm of peritoneum, unspecified: Secondary | ICD-10-CM

## 2016-06-14 DIAGNOSIS — C569 Malignant neoplasm of unspecified ovary: Secondary | ICD-10-CM

## 2016-06-14 LAB — CBC WITH DIFFERENTIAL/PLATELET
BASO%: 1.2 % (ref 0.0–2.0)
BASOS ABS: 0 10*3/uL (ref 0.0–0.1)
EOS%: 2.8 % (ref 0.0–7.0)
Eosinophils Absolute: 0.1 10*3/uL (ref 0.0–0.5)
HCT: 36.5 % (ref 34.8–46.6)
HGB: 11.7 g/dL (ref 11.6–15.9)
LYMPH%: 22.4 % (ref 14.0–49.7)
MCH: 28.8 pg (ref 25.1–34.0)
MCHC: 31.9 g/dL (ref 31.5–36.0)
MCV: 90.2 fL (ref 79.5–101.0)
MONO#: 0.4 10*3/uL (ref 0.1–0.9)
MONO%: 9.7 % (ref 0.0–14.0)
NEUT#: 2.6 10*3/uL (ref 1.5–6.5)
NEUT%: 63.9 % (ref 38.4–76.8)
Platelets: 255 10*3/uL (ref 145–400)
RBC: 4.05 10*6/uL (ref 3.70–5.45)
RDW: 18.4 % — ABNORMAL HIGH (ref 11.2–14.5)
WBC: 4.1 10*3/uL (ref 3.9–10.3)
lymph#: 0.9 10*3/uL (ref 0.9–3.3)

## 2016-06-14 LAB — COMPREHENSIVE METABOLIC PANEL
ALT: 17 U/L (ref 0–55)
AST: 15 U/L (ref 5–34)
Albumin: 3.4 g/dL — ABNORMAL LOW (ref 3.5–5.0)
Alkaline Phosphatase: 88 U/L (ref 40–150)
Anion Gap: 10 mEq/L (ref 3–11)
BUN: 19.9 mg/dL (ref 7.0–26.0)
CALCIUM: 9.8 mg/dL (ref 8.4–10.4)
CHLORIDE: 102 meq/L (ref 98–109)
CO2: 29 mEq/L (ref 22–29)
Creatinine: 1 mg/dL (ref 0.6–1.1)
EGFR: 55 mL/min/{1.73_m2} — ABNORMAL LOW (ref >90)
Glucose: 118 mg/dl (ref 70–140)
POTASSIUM: 3.8 meq/L (ref 3.5–5.1)
SODIUM: 141 meq/L (ref 136–145)
Total Bilirubin: 0.43 mg/dL (ref 0.20–1.20)
Total Protein: 7 g/dL (ref 6.4–8.3)

## 2016-06-14 MED ORDER — DEXAMETHASONE SODIUM PHOSPHATE 10 MG/ML IJ SOLN
INTRAMUSCULAR | Status: AC
  Filled 2016-06-14: qty 1

## 2016-06-14 MED ORDER — SODIUM CHLORIDE 0.9% FLUSH
10.0000 mL | INTRAVENOUS | Status: DC | PRN
  Administered 2016-06-14: 10 mL
  Filled 2016-06-14: qty 10

## 2016-06-14 MED ORDER — ZOLPIDEM TARTRATE 5 MG PO TABS: 5.0000 mg | ORAL_TABLET | Freq: Every evening | ORAL | 0 refills | Status: DC | PRN

## 2016-06-14 MED ORDER — SODIUM CHLORIDE 0.9 % IV SOLN
Freq: Once | INTRAVENOUS | Status: AC
  Administered 2016-06-14: 11:00:00 via INTRAVENOUS

## 2016-06-14 MED ORDER — DIPHENHYDRAMINE HCL 50 MG/ML IJ SOLN
25.0000 mg | Freq: Once | INTRAMUSCULAR | Status: AC
  Administered 2016-06-14: 25 mg via INTRAVENOUS

## 2016-06-14 MED ORDER — SODIUM CHLORIDE 0.9 % IV SOLN
80.0000 mg/m2 | Freq: Once | INTRAVENOUS | Status: AC
  Administered 2016-06-14: 150 mg via INTRAVENOUS
  Filled 2016-06-14: qty 25

## 2016-06-14 MED ORDER — HEPARIN SOD (PORK) LOCK FLUSH 100 UNIT/ML IV SOLN
500.0000 [IU] | Freq: Once | INTRAVENOUS | Status: AC | PRN
  Administered 2016-06-14: 500 [IU]
  Filled 2016-06-14: qty 5

## 2016-06-14 MED ORDER — DIPHENHYDRAMINE HCL 50 MG/ML IJ SOLN
INTRAMUSCULAR | Status: AC
  Filled 2016-06-14: qty 1

## 2016-06-14 MED ORDER — FAMOTIDINE IN NACL 20-0.9 MG/50ML-% IV SOLN
20.0000 mg | Freq: Once | INTRAVENOUS | Status: AC
  Administered 2016-06-14: 20 mg via INTRAVENOUS

## 2016-06-14 MED ORDER — FAMOTIDINE IN NACL 20-0.9 MG/50ML-% IV SOLN
INTRAVENOUS | Status: AC
  Filled 2016-06-14: qty 50

## 2016-06-14 MED ORDER — DEXAMETHASONE SODIUM PHOSPHATE 10 MG/ML IJ SOLN
5.0000 mg | Freq: Once | INTRAMUSCULAR | Status: AC
  Administered 2016-06-14: 5 mg via INTRAVENOUS

## 2016-06-14 MED ORDER — SODIUM CHLORIDE 0.9 % IJ SOLN
10.0000 mL | INTRAMUSCULAR | Status: DC | PRN
  Administered 2016-06-14: 10 mL via INTRAVENOUS
  Filled 2016-06-14: qty 10

## 2016-06-14 NOTE — Patient Instructions (Signed)

## 2016-06-14 NOTE — Telephone Encounter (Signed)
Appointments scheduled per 12/6 LOS. Patient given AVS report and calendars with future scheduled appointments. °

## 2016-06-14 NOTE — Telephone Encounter (Signed)
Per LOS I have scheduled appt and notified the scheduler 

## 2016-06-14 NOTE — Patient Instructions (Signed)
[facility] Discharge Instructions for Patients Receiving Chemotherapy  Today you received the following chemotherapy agents:  Taxol  To help prevent nausea and vomiting after your treatment, we encourage you to take your nausea medication as prescribed.   If you develop nausea and vomiting that is not controlled by your nausea medication, call the clinic.   BELOW ARE SYMPTOMS THAT SHOULD BE REPORTED IMMEDIATELY:  *FEVER GREATER THAN 100.5 F  *CHILLS WITH OR WITHOUT FEVER  NAUSEA AND VOMITING THAT IS NOT CONTROLLED WITH YOUR NAUSEA MEDICATION  *UNUSUAL SHORTNESS OF BREATH  *UNUSUAL BRUISING OR BLEEDING  TENDERNESS IN MOUTH AND THROAT WITH OR WITHOUT PRESENCE OF ULCERS  *URINARY PROBLEMS  *BOWEL PROBLEMS  UNUSUAL RASH Items with * indicate a potential emergency and should be followed up as soon as possible.  Feel free to call the clinic you have any questions or concerns. The clinic phone number is [phone].  Please show the CHEMO ALERT CARD at check-in to the Emergency Department and triage nurse.   

## 2016-06-14 NOTE — Progress Notes (Signed)
[facility] OFFICE PROGRESS NOTE   Diagnosis: Ovarian cancer  INTERVAL HISTORY:   Lynn Ramirez returns as scheduled. She was last treated with Taxol 05/31/2016. She has persistent neuropathy symptoms in the hands and feet. This does not interfere with activity. The exertional dyspnea is worse with the change in the weather. No difficulty with bowel function.  Objective:  Vital signs in last 24 hours:  Blood pressure (!) 148/71, pulse 86, temperature 97.3 F (36.3 C), temperature source Oral, resp. rate 18, height 5\' 1"  (1.549 m), weight 189 lb 3.2 oz (85.8 kg), SpO2 94 %.    HEENT:  No  thrush Resp:  Distant breath sounds, scattered coarse rhonchi, no respiratory distress Cardio:  Regular  rate and rhythm GI:  No hepatomegaly, no mass, no apparent ascites Vascular:  No leg edema  Portacath/PICC-without erythema  Lab Results:  Lab Results  Component Value Date   WBC 4.1 06/14/2016   HGB 11.7 06/14/2016   HCT 36.5 06/14/2016   MCV 90.2 06/14/2016   PLT 255 06/14/2016   NEUTROABS 2.6 06/14/2016     Medications: I have reviewed the patient's current medications.  Assessment/Plan: 1. 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, immunohistochemical profile consistent with a GYN primary, elevated CA 125                                                                                                                          Staging CTs of the chest, abdomen, and pelvis on 10/06/2014 revealed a loculated right pleural effusion, ascites, and omental/mesenteric thickening  Cytology from peritoneal fluid 10/13/2014 revealed malignant cells consistent with metastatic adenocarcinoma Biopsy of an omental mass on 11/02/2014 revealed invasive serous carcinoma with psammoma bodies Cycle 1 Taxol/carboplatin 10/28/2014 Cycle 2 Taxol/carboplatin 11/18/2014 Cycle 3 Taxol/carboplatin 12/09/2014 CT scan 12/23/2014  with interval improvement in peritoneal carcinomatosis with near-complete resolution of ascites and decreased omental nodularity. Significant improvement in malignant right pleural effusion. Status post robotic-assisted laparoscopic hysterectomy with bilateral salpingoophorectomy, omentectomy, radical tumor debulking 12/29/2014. Per Dr. Serita Grit office note 01/14/2015 cytoreduction was optimal with residual disease remaining only in the 1 mm implants on the small intestine. 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 Cycle 1 adjuvant Taxol/carboplatin 01/20/2015 Cycle 2 adjuvant Taxol/carboplatin 02/10/2015 Cycle 3 adjuvant Taxol/carboplatin 03/10/2015 CA 125 on 1013 2016-42 CTs of the chest, abdomen, and pelvis 05/31/2015 with no evidence of progressive ovarian cancer CTs of the chest, abdomen, and pelvis 08/07/2015 and 08/08/2015-no evidence of progressive ovarian cancer Peritoneal studding noted at the time of the cholecystectomy procedure 08/09/2015 Elevated CA 125 10/07/2015 CT 10/07/2015 with stricturing at the sigmoid colon, constipation, and omental nodularity Initiation of salvage weekly Taxol chemotherapy 10/13/2015 Taxol changed to every 2 weeks beginning 01/19/2016 due to  painful neuropathy. 2. COPD 3. Dyspnea secondary to COPD and the large right pleural effusion, status post therapeutic thoracentesis procedures 09/30/2014,10/09/2014, and 10/21/2014. Left thoracentesis 10/30/2014 4. Left nephrectomy as a child 5. Delayed nausea following cycle 1 Taxol/carboplatin, Aloxi/Emend added with cycle 2 with improvement. 6. Right lower extremity edema, right calf pain 12/09/2014. Negative venous Doppler  12/09/2014. 7. Diffuse pruritus following cycle 1 adjuvant Taxol/carboplatin 01/20/2015, no rash, resolved with a steroid dose pack 8. Thrombocytopenia second to chemotherapy, the carboplatin was dose reduced with cycle 2 adjuvant Taxol/carboplatin 02/10/2015 9. Chemotherapy-induced peripheral neuropathy-painful peripheral neuropathy involving the feet 01/19/2016.  10. Admission with acute cholecystitis 08/07/2015, status post a cholecystectomy 08/09/2015 11. Admission 10/08/2015 with abdominal pain/constipation, improved with bowel rest and laxatives 12. Mild right hydronephrosis noted on the CT 10/07/2015. Renal ultrasound 12/16/2015 with no hydronephrosis noted.     Disposition:   [name] appears stable. The plan is to continue every 2 week Taxol. We will add Avastin for a persistent rise in the CA 125.  She will return for an office visit and the next treatment with Taxol on 07/06/2016.  Betsy Coder, MD  06/14/2016  10:24 AM

## 2016-06-15 ENCOUNTER — Telehealth: Payer: Self-pay | Admitting: *Deleted

## 2016-06-15 LAB — CA 125: Cancer Antigen (CA) 125: 50.3 U/mL — ABNORMAL HIGH (ref 0.0–38.1)

## 2016-06-15 NOTE — Telephone Encounter (Signed)
Call received from patient to obtain CA 125 results from 06/14/16.  Results given to patient.  Patient appreciative of information and has no questions at this time.

## 2016-06-20 ENCOUNTER — Telehealth: Payer: Self-pay | Admitting: *Deleted

## 2016-06-20 NOTE — Telephone Encounter (Signed)
Message from pt reporting diarrhea. Began last night, has had more than 10 stools today. Returned call, pt reports she has taken 66mL of Imodium today.. It seems to have slowed some, having fecal urgency and incontinence. Pt reports mild nausea no vomiting. Denies abdominal pain. Pt was given instructions to push electrolyte containing fluids by Thu, Triage RN. Reviewed with Dr. Benay Spice: OK to continue Imodium, call for abdominal pain/ vomiting. Resume soft diet, push fluids. Call office if not resolved in 1-2 days. Pt informed of above instructions. Informed her she can take up to 8 doses per day of Imodium, she voiced understanding.

## 2016-06-26 ENCOUNTER — Other Ambulatory Visit: Payer: Self-pay | Admitting: Nurse Practitioner

## 2016-06-26 ENCOUNTER — Other Ambulatory Visit: Payer: Self-pay | Admitting: Oncology

## 2016-06-26 DIAGNOSIS — C561 Malignant neoplasm of right ovary: Secondary | ICD-10-CM

## 2016-06-26 DIAGNOSIS — C799 Secondary malignant neoplasm of unspecified site: Secondary | ICD-10-CM

## 2016-07-05 ENCOUNTER — Ambulatory Visit: Payer: Medicare Other | Admitting: Nurse Practitioner

## 2016-07-05 ENCOUNTER — Ambulatory Visit: Payer: Medicare Other

## 2016-07-05 ENCOUNTER — Other Ambulatory Visit: Payer: Medicare Other

## 2016-07-06 ENCOUNTER — Ambulatory Visit (HOSPITAL_BASED_OUTPATIENT_CLINIC_OR_DEPARTMENT_OTHER): Payer: Medicare Other | Admitting: Nurse Practitioner

## 2016-07-06 ENCOUNTER — Telehealth: Payer: Self-pay | Admitting: Oncology

## 2016-07-06 ENCOUNTER — Ambulatory Visit (HOSPITAL_BASED_OUTPATIENT_CLINIC_OR_DEPARTMENT_OTHER): Payer: Medicare Other

## 2016-07-06 ENCOUNTER — Other Ambulatory Visit: Payer: Self-pay | Admitting: Hematology & Oncology

## 2016-07-06 ENCOUNTER — Encounter: Payer: Self-pay | Admitting: Nurse Practitioner

## 2016-07-06 ENCOUNTER — Ambulatory Visit: Payer: Medicare Other

## 2016-07-06 ENCOUNTER — Other Ambulatory Visit (HOSPITAL_BASED_OUTPATIENT_CLINIC_OR_DEPARTMENT_OTHER): Payer: Medicare Other

## 2016-07-06 VITALS — BP 152/87 | HR 84 | Temp 97.6°F | Resp 18 | Ht 61.0 in | Wt 188.8 lb

## 2016-07-06 DIAGNOSIS — C482 Malignant neoplasm of peritoneum, unspecified: Secondary | ICD-10-CM

## 2016-07-06 DIAGNOSIS — Z5111 Encounter for antineoplastic chemotherapy: Secondary | ICD-10-CM

## 2016-07-06 DIAGNOSIS — R252 Cramp and spasm: Secondary | ICD-10-CM

## 2016-07-06 DIAGNOSIS — Z95828 Presence of other vascular implants and grafts: Secondary | ICD-10-CM

## 2016-07-06 DIAGNOSIS — C569 Malignant neoplasm of unspecified ovary: Secondary | ICD-10-CM

## 2016-07-06 DIAGNOSIS — C561 Malignant neoplasm of right ovary: Secondary | ICD-10-CM | POA: Diagnosis not present

## 2016-07-06 DIAGNOSIS — R197 Diarrhea, unspecified: Secondary | ICD-10-CM | POA: Insufficient documentation

## 2016-07-06 LAB — CBC WITH DIFFERENTIAL/PLATELET
BASO%: 0.9 % (ref 0.0–2.0)
BASOS ABS: 0.1 10*3/uL (ref 0.0–0.1)
EOS ABS: 1.2 10*3/uL — AB (ref 0.0–0.5)
EOS%: 15.8 % — ABNORMAL HIGH (ref 0.0–7.0)
HEMATOCRIT: 37 % (ref 34.8–46.6)
HGB: 12 g/dL (ref 11.6–15.9)
LYMPH#: 1.3 10*3/uL (ref 0.9–3.3)
LYMPH%: 17.3 % (ref 14.0–49.7)
MCH: 29.6 pg (ref 25.1–34.0)
MCHC: 32.4 g/dL (ref 31.5–36.0)
MCV: 91.2 fL (ref 79.5–101.0)
MONO#: 0.5 10*3/uL (ref 0.1–0.9)
MONO%: 7.4 % (ref 0.0–14.0)
NEUT#: 4.4 10*3/uL (ref 1.5–6.5)
NEUT%: 58.6 % (ref 38.4–76.8)
PLATELETS: 262 10*3/uL (ref 145–400)
RBC: 4.06 10*6/uL (ref 3.70–5.45)
RDW: 19.2 % — ABNORMAL HIGH (ref 11.2–14.5)
WBC: 7.4 10*3/uL (ref 3.9–10.3)

## 2016-07-06 LAB — COMPREHENSIVE METABOLIC PANEL
ALT: 14 U/L (ref 0–55)
ANION GAP: 9 meq/L (ref 3–11)
AST: 13 U/L (ref 5–34)
Albumin: 3.3 g/dL — ABNORMAL LOW (ref 3.5–5.0)
Alkaline Phosphatase: 107 U/L (ref 40–150)
BILIRUBIN TOTAL: 0.3 mg/dL (ref 0.20–1.20)
BUN: 19.3 mg/dL (ref 7.0–26.0)
CALCIUM: 9.7 mg/dL (ref 8.4–10.4)
CHLORIDE: 102 meq/L (ref 98–109)
CO2: 30 mEq/L — ABNORMAL HIGH (ref 22–29)
CREATININE: 0.9 mg/dL (ref 0.6–1.1)
EGFR: 60 mL/min/{1.73_m2} — ABNORMAL LOW (ref >90)
Glucose: 114 mg/dl (ref 70–140)
Potassium: 4 mEq/L (ref 3.5–5.1)
Sodium: 141 mEq/L (ref 136–145)
Total Protein: 6.6 g/dL (ref 6.4–8.3)

## 2016-07-06 MED ORDER — PACLITAXEL CHEMO INJECTION 300 MG/50ML
80.0000 mg/m2 | Freq: Once | INTRAVENOUS | Status: AC
  Administered 2016-07-06: 150 mg via INTRAVENOUS
  Filled 2016-07-06: qty 25

## 2016-07-06 MED ORDER — DIPHENOXYLATE-ATROPINE 2.5-0.025 MG PO TABS: 2.0000 | ORAL_TABLET | Freq: Four times a day (QID) | ORAL | 0 refills | Status: DC | PRN

## 2016-07-06 MED ORDER — DEXAMETHASONE SODIUM PHOSPHATE 10 MG/ML IJ SOLN
5.0000 mg | Freq: Once | INTRAMUSCULAR | Status: AC
  Administered 2016-07-06: 5 mg via INTRAVENOUS

## 2016-07-06 MED ORDER — DIPHENHYDRAMINE HCL 50 MG/ML IJ SOLN
25.0000 mg | Freq: Once | INTRAMUSCULAR | Status: AC
  Administered 2016-07-06: 25 mg via INTRAVENOUS

## 2016-07-06 MED ORDER — FAMOTIDINE IN NACL 20-0.9 MG/50ML-% IV SOLN
INTRAVENOUS | Status: AC
  Filled 2016-07-06: qty 50

## 2016-07-06 MED ORDER — SODIUM CHLORIDE 0.9 % IJ SOLN
10.0000 mL | INTRAMUSCULAR | Status: DC | PRN
  Administered 2016-07-06: 10 mL via INTRAVENOUS
  Filled 2016-07-06: qty 10

## 2016-07-06 MED ORDER — FAMOTIDINE IN NACL 20-0.9 MG/50ML-% IV SOLN
20.0000 mg | Freq: Once | INTRAVENOUS | Status: AC
  Administered 2016-07-06: 20 mg via INTRAVENOUS

## 2016-07-06 MED ORDER — HEPARIN SOD (PORK) LOCK FLUSH 100 UNIT/ML IV SOLN
500.0000 [IU] | Freq: Once | INTRAVENOUS | Status: AC | PRN
  Administered 2016-07-06: 500 [IU]
  Filled 2016-07-06: qty 5

## 2016-07-06 MED ORDER — DIPHENHYDRAMINE HCL 50 MG/ML IJ SOLN
INTRAMUSCULAR | Status: AC
  Filled 2016-07-06: qty 1

## 2016-07-06 MED ORDER — DEXAMETHASONE SODIUM PHOSPHATE 10 MG/ML IJ SOLN
INTRAMUSCULAR | Status: AC
  Filled 2016-07-06: qty 1

## 2016-07-06 MED ORDER — SODIUM CHLORIDE 0.9 % IV SOLN
Freq: Once | INTRAVENOUS | Status: AC
  Administered 2016-07-06: 11:00:00 via INTRAVENOUS

## 2016-07-06 MED ORDER — SODIUM CHLORIDE 0.9% FLUSH
10.0000 mL | INTRAVENOUS | Status: DC | PRN
  Administered 2016-07-06: 10 mL
  Filled 2016-07-06: qty 10

## 2016-07-06 NOTE — Telephone Encounter (Signed)
No LOS per 07/06/16 visit.

## 2016-07-06 NOTE — Assessment & Plan Note (Signed)
Patient presented to the [facility] today to receive cycle 7, day 15 of her Taxol chemotherapy regimen.  She states she's been on fairly well recently; with the exception of some intermittent, explosive diarrhea.  She also has had some cramping of her hands intermittently as well.  She continues with some mild, stable.  Neuropathy to her extremities.  She denies any recent fevers or chills.  Labs obtained today were all within normal limits.  Patient will proceed today with her chemotherapy as planned.  She will return on 07/19/2016 for labs, flush, visit, and her next cycle of chemotherapy.

## 2016-07-06 NOTE — Progress Notes (Signed)
HPI:  Lynn Ramirez 75 y.o. female diagnosed with ovarian cancer.  Currently undergoing Taxol chemotherapy regimen.    No history exists.    Review of Systems  Gastrointestinal: Positive for diarrhea.  Musculoskeletal:       Hand cramping  All other systems reviewed and are negative.   Past Medical History:  Diagnosis Date  . Cancer ([place])    ovarian  . COPD (chronic obstructive pulmonary disease) ([place])   . Difficulty sleeping   . Eczema    hands  . Emphysema   . GERD (gastroesophageal reflux disease)   . H/O hydronephrosis   . History of transfusion    age 45  . Hypertension     Past Surgical History:  Procedure Laterality Date  . CHOLECYSTECTOMY N/A 08/09/2015   Procedure: Attempted LAPAROSCOPIC coverted  open CHOLECYSTECTOMY WITH INTRAOPERATIVE CHOLANGIOGRAM;  Surgeon: Autumn Messing III, MD;  Location: [facility];  Service: General;  Laterality: N/A;  . HAND SURGERY Right 1980's  . LAPAROTOMY N/A 12/29/2014   Procedure:  LAPAROTOMY;  Surgeon: Everitt Amber, MD;  Location: [facility];  Service: Gynecology;  Laterality: N/A;  . NEPHRECTOMY    . ROBOTIC ASSISTED TOTAL HYSTERECTOMY WITH BILATERAL SALPINGO OOPHERECTOMY Bilateral 12/29/2014   Procedure: ROBOTIC ASSISTED TOTAL LAPAROSCOPIC HYSTERECTOMY WITH BILATERAL SALPINGO OOPHORECTOMY AND OOMENTECTOMY WITH RADICAL TUMOR [place] ;  Surgeon: Everitt Amber, MD;  Location: [facility];  Service: Gynecology;  Laterality: Bilateral;  . THORACENTESIS     several  . TONSILLECTOMY    . TUBAL LIGATION      has COPD  GOLD III; Encopresis; Pelvic mass in female; Insomnia; Metastatic adenocarcinoma ([place]); Primary peritoneal carcinomatosis ([place]); Adenocarcinoma ([place]); Ovarian cancer ([place]); Genetic testing; Malignant neoplasm of fallopian tube ([place]); Family history of pancreatic cancer; Family history of breast cancer in female; Chemotherapy-induced neuropathy ([place]); CAD (coronary artery disease); Hyperlipidemia; Claudication ([place]); Chronic respiratory  failure with hypoxia ([place]); Cholecystitis; Acute cholecystitis; Solitary pulmonary nodule on lung CT; Acute calculous cholecystitis; Abnormal CXR; COPD (chronic obstructive pulmonary disease) ([place]); Obstipation; Constipation; Sigmoid stricture; Port catheter in place; Allergic rhinitis; Diarrhea; and Cramping of hands on her problem list.    is allergic to latex and penicillins.  Allergies as of 07/06/2016      Reactions   Latex Rash   Latex gloves ONLY   Penicillins Other (See Comments)   Has patient had a PCN reaction causing immediate rash, facial/tongue/throat swelling, SOB or lightheadedness with hypotension: Yes  Has patient had a PCN reaction causing severe rash involving mucus membranes or skin necrosis:No Has patient had a PCN reaction that required hospitalization:No Has patient had a PCN reaction occurring within the last 10 years:No If all of the above answers are "NO", then may proceed with Cephalosporin use. welps      Medication List       Accurate as of 07/06/16  4:38 PM. Always use your most recent med list.          antiseptic oral rinse Liqd 15 mLs by Mouth Rinse route 3 (three) times daily.   atorvastatin 10 MG tablet Commonly known as:  LIPITOR Take 10 mg by mouth daily.   Biotin 2500 MCG Caps Take 1 tablet by mouth daily.   budesonide-formoterol 160-4.5 MCG/ACT inhaler Commonly known as:  SYMBICORT Inhale 2 puffs into the lungs 2 (two) times daily.   cholecalciferol 1000 units tablet Commonly known as:  VITAMIN D Take 1,000 Units by mouth daily.   diphenoxylate-atropine 2.5-0.025 MG tablet Commonly known as:  LOMOTIL Take 2 tablets by mouth 4 (four) times daily as needed for diarrhea or loose stools.   docusate sodium 100 MG capsule Commonly known as:  COLACE Take 1 capsule (100 mg total) by mouth 2 (two) times daily.   famotidine 10 MG tablet Commonly known as:  PEPCID Take 20 mg by mouth daily.   fluocinonide cream 0.05 % Commonly known  as:  LIDEX Apply 1 application topically 2 (two) times daily as needed (For ezcema).   lidocaine-prilocaine cream Commonly known as:  EMLA Apply to port site one hour prior to use. Do not rub in. Cover with plastic.   loperamide 2 MG capsule Commonly known as:  IMODIUM Take by mouth as needed for diarrhea or loose stools.   OMEGA-3 PO Take 1 capsule by mouth daily.   ondansetron 4 MG disintegrating tablet Commonly known as:  ZOFRAN-ODT DISSOLVE 1 TABLET BY MOUTH DAILY AS NEEDED FOR NAUSEA OR VOMITING   polyethylene glycol packet Commonly known as:  MIRALAX / GLYCOLAX Take 17 g by mouth daily.   PROAIR HFA 108 (90 Base) MCG/ACT inhaler Generic drug:  albuterol INHALE 2 PUFFS INTO THE LUNGS FOUR TIMES DAILY AS NEEDED FOR WHEEZING   senna 8.6 MG Tabs tablet Commonly known as:  SENOKOT Take 2 tablets (17.2 mg total) by mouth daily.   traMADol 50 MG tablet Commonly known as:  ULTRAM TAKE 1 TABLET BY MOUTH EVERY 12 HOURS AS NEEDED FOR PAIN   triamterene-hydrochlorothiazide 37.5-25 MG tablet Commonly known as:  MAXZIDE-25 Take 0.5 tablets by mouth daily.   TYLENOL 325 MG Caps Generic drug:  Acetaminophen Take 1 tablet by mouth 3 (three) times daily as needed (pain).   vitamin B-12 100 MCG tablet Commonly known as:  CYANOCOBALAMIN Take 100 mcg by mouth daily. Reported on 01/19/2016   zolpidem 5 MG tablet Commonly known as:  AMBIEN Take 1 tablet (5 mg total) by mouth at bedtime as needed for sleep.        PHYSICAL EXAMINATION  Oncology Vitals 07/06/2016 06/14/2016  Height 155 cm 155 cm  Weight 85.639 kg 85.821 kg  Weight (lbs) 188 lbs 13 oz 189 lbs 3 oz  BMI (kg/m2) 35.67 kg/m2 35.75 kg/m2  Temp 97.6 97.3  Pulse 84 86  Resp 18 18  SpO2 95 94  BSA (m2) 1.92 m2 1.92 m2   BP Readings from Last 2 Encounters:  07/06/16 (!) 152/87  06/14/16 (!) 148/71    Physical Exam  Constitutional: She is oriented to person, place, and time and well-developed, well-nourished,  and in no distress.  HENT:  Head: Normocephalic and atraumatic.  Mouth/Throat: Oropharynx is clear and moist.  Eyes: Conjunctivae and EOM are normal. Pupils are equal, round, and reactive to light.  Neck: Normal range of motion.  Pulmonary/Chest: Effort normal. No respiratory distress.  Musculoskeletal: Normal range of motion.  Neurological: She is alert and oriented to person, place, and time. Gait normal.  Skin: Skin is warm and dry.  Psychiatric: Affect normal.  Nursing note and vitals reviewed.   LABORATORY DATA:. Appointment on 07/06/2016  Component Date Value Ref Range Status  . WBC 07/06/2016 7.4  3.9 - 10.3 10e3/uL Final  . NEUT# 07/06/2016 4.4  1.5 - 6.5 10e3/uL Final  . HGB 07/06/2016 12.0  11.6 - 15.9 g/dL Final  . HCT 07/06/2016 37.0  34.8 - 46.6 % Final  . Platelets 07/06/2016 262  145 - 400 10e3/uL Final  . MCV 07/06/2016 91.2  79.5 - 101.0 fL Final  .  Groom 07/06/2016 29.6  25.1 - 34.0 pg Final  . MCHC 07/06/2016 32.4  31.5 - 36.0 g/dL Final  . RBC 07/06/2016 4.06  3.70 - 5.45 10e6/uL Final  . RDW 07/06/2016 19.2* 11.2 - 14.5 % Final  . lymph# 07/06/2016 1.3  0.9 - 3.3 10e3/uL Final  . MONO# 07/06/2016 0.5  0.1 - 0.9 10e3/uL Final  . Eosinophils Absolute 07/06/2016 1.2* 0.0 - 0.5 10e3/uL Final  . Basophils Absolute 07/06/2016 0.1  0.0 - 0.1 10e3/uL Final  . NEUT% 07/06/2016 58.6  38.4 - 76.8 % Final  . LYMPH% 07/06/2016 17.3  14.0 - 49.7 % Final  . MONO% 07/06/2016 7.4  0.0 - 14.0 % Final  . EOS% 07/06/2016 15.8* 0.0 - 7.0 % Final  . BASO% 07/06/2016 0.9  0.0 - 2.0 % Final  . Sodium 07/06/2016 141  136 - 145 mEq/L Final  . Potassium 07/06/2016 4.0  3.5 - 5.1 mEq/L Final  . Chloride 07/06/2016 102  98 - 109 mEq/L Final  . CO2 07/06/2016 30* 22 - 29 mEq/L Final  . Glucose 07/06/2016 114  70 - 140 mg/dl Final  . BUN 07/06/2016 19.3  7.0 - 26.0 mg/dL Final  . Creatinine 07/06/2016 0.9  0.6 - 1.1 mg/dL Final  . Total Bilirubin 07/06/2016 0.30  0.20 - 1.20 mg/dL  Final  . Alkaline Phosphatase 07/06/2016 107  40 - 150 U/L Final  . AST 07/06/2016 13  5 - 34 U/L Final  . ALT 07/06/2016 14  0 - 55 U/L Final  . Total Protein 07/06/2016 6.6  6.4 - 8.3 g/dL Final  . Albumin 07/06/2016 3.3* 3.5 - 5.0 g/dL Final  . Calcium 07/06/2016 9.7  8.4 - 10.4 mg/dL Final  . Anion Gap 07/06/2016 9  3 - 11 mEq/L Final  . EGFR 07/06/2016 60* >90 ml/min/1.73 m2 Final    RADIOGRAPHIC STUDIES: No results found.  ASSESSMENT/PLAN:    Primary peritoneal carcinomatosis [facility]) Patient presented to the [facility] today to receive cycle 7, day 15 of her Taxol chemotherapy regimen.  She states she's been on fairly well recently; with the exception of some intermittent, explosive diarrhea.  She also has had some cramping of her hands intermittently as well.  She continues with some mild, stable.  Neuropathy to her extremities.  She denies any recent fevers or chills.  Labs obtained today were all within normal limits.  Patient will proceed today with her chemotherapy as planned.  She will return on 07/19/2016 for labs, flush, visit, and her next cycle of chemotherapy.  Diarrhea Patient states that she's experienced up to 3 episodes of diarrhea that were sometimes explosive.  Intermittently per day.  She states she takes Imodium; and it controls the diarrhea.  She has had no diarrhea within the past 24 hours.  Patient was given a prescription for Lomotil to try as well.  She was advised that she could alternate both the Imodium and Lomotil if she would like.  Cramping of hands Patient states she's had some new onset cramping of her hands at times.  Advised patient that the cramping of her hands could be a side effect of her chemotherapy; but could also be a symptom of dehydration.  She was advised to try over-the-counter magnesium; but could also try regular tonic water with quinine as well to see if this would help.   Patient stated understanding of all instructions; and  was in agreement with this plan of care. The patient knows to call the  clinic with any problems, questions or concerns.   Total time spent with patient was 25 minutes;  with greater than 75 percent of that time spent in face to face counseling regarding patient's symptoms,  and coordination of care and follow up.  Disclaimer:This dictation was prepared with Dragon/digital dictation along with [employer]. Any transcriptional errors that result from this process are unintentional.  Drue Second, NP 07/06/2016

## 2016-07-06 NOTE — Assessment & Plan Note (Signed)
Patient states that she's experienced up to 3 episodes of diarrhea that were sometimes explosive.  Intermittently per day.  She states she takes Imodium; and it controls the diarrhea.  She has had no diarrhea within the past 24 hours.  Patient was given a prescription for Lomotil to try as well.  She was advised that she could alternate both the Imodium and Lomotil if she would like.

## 2016-07-06 NOTE — Assessment & Plan Note (Signed)
Patient states she's had some new onset cramping of her hands at times.  Advised patient that the cramping of her hands could be a side effect of her chemotherapy; but could also be a symptom of dehydration.  She was advised to try over-the-counter magnesium; but could also try regular tonic water with quinine as well to see if this would help.

## 2016-07-06 NOTE — Patient Instructions (Signed)
[facility] Discharge Instructions for Patients Receiving Chemotherapy  Today you received the following chemotherapy agents:  Taxol  To help prevent nausea and vomiting after your treatment, we encourage you to take your nausea medication as prescribed.   If you develop nausea and vomiting that is not controlled by your nausea medication, call the clinic.   BELOW ARE SYMPTOMS THAT SHOULD BE REPORTED IMMEDIATELY:  *FEVER GREATER THAN 100.5 F  *CHILLS WITH OR WITHOUT FEVER  NAUSEA AND VOMITING THAT IS NOT CONTROLLED WITH YOUR NAUSEA MEDICATION  *UNUSUAL SHORTNESS OF BREATH  *UNUSUAL BRUISING OR BLEEDING  TENDERNESS IN MOUTH AND THROAT WITH OR WITHOUT PRESENCE OF ULCERS  *URINARY PROBLEMS  *BOWEL PROBLEMS  UNUSUAL RASH Items with * indicate a potential emergency and should be followed up as soon as possible.  Feel free to call the clinic you have any questions or concerns. The clinic phone number is [phone].  Please show the CHEMO ALERT CARD at check-in to the Emergency Department and triage nurse.   

## 2016-07-07 ENCOUNTER — Telehealth: Payer: Self-pay | Admitting: *Deleted

## 2016-07-07 ENCOUNTER — Telehealth: Payer: Self-pay | Admitting: Medical Oncology

## 2016-07-07 LAB — CA 125: Cancer Antigen (CA) 125: 56.8 U/mL — ABNORMAL HIGH (ref 0.0–38.1)

## 2016-07-07 NOTE — Telephone Encounter (Signed)
Per Dr. Benay Spice, patient notified that ca125 is slightly higher and that he will discuss adding Avastin at next visit.  Patient appreciative of call and has no questions at this time.

## 2016-07-07 NOTE — Telephone Encounter (Signed)
Wants ca 125 result

## 2016-07-07 NOTE — Telephone Encounter (Signed)
-----   Message from Ladell Pier, MD sent at 07/07/2016 12:42 PM EST ----- Please call patient, ca125 slightly higher, will discuss adding avastin at next visit

## 2016-07-19 ENCOUNTER — Telehealth: Payer: Self-pay | Admitting: Nurse Practitioner

## 2016-07-19 ENCOUNTER — Ambulatory Visit (HOSPITAL_COMMUNITY)
Admission: RE | Admit: 2016-07-19 | Discharge: 2016-07-19 | Disposition: A | Discharge status: Home / Self Care | Payer: Medicare Other | Source: Ambulatory Visit | Attending: Nurse Practitioner | Admitting: Nurse Practitioner

## 2016-07-19 ENCOUNTER — Ambulatory Visit: Payer: Medicare Other

## 2016-07-19 ENCOUNTER — Ambulatory Visit (HOSPITAL_BASED_OUTPATIENT_CLINIC_OR_DEPARTMENT_OTHER): Payer: Medicare Other | Admitting: Nurse Practitioner

## 2016-07-19 ENCOUNTER — Encounter: Payer: Self-pay | Admitting: Oncology

## 2016-07-19 ENCOUNTER — Ambulatory Visit (HOSPITAL_BASED_OUTPATIENT_CLINIC_OR_DEPARTMENT_OTHER): Payer: Medicare Other

## 2016-07-19 ENCOUNTER — Other Ambulatory Visit (HOSPITAL_BASED_OUTPATIENT_CLINIC_OR_DEPARTMENT_OTHER): Payer: Medicare Other

## 2016-07-19 VITALS — BP 142/70 | HR 92 | Temp 97.8°F | Resp 18 | Wt 188.9 lb

## 2016-07-19 DIAGNOSIS — C561 Malignant neoplasm of right ovary: Secondary | ICD-10-CM

## 2016-07-19 DIAGNOSIS — Z905 Acquired absence of kidney: Secondary | ICD-10-CM | POA: Insufficient documentation

## 2016-07-19 DIAGNOSIS — C5701 Malignant neoplasm of right fallopian tube: Secondary | ICD-10-CM

## 2016-07-19 DIAGNOSIS — R1011 Right upper quadrant pain: Secondary | ICD-10-CM

## 2016-07-19 DIAGNOSIS — D6959 Other secondary thrombocytopenia: Secondary | ICD-10-CM

## 2016-07-19 DIAGNOSIS — C569 Malignant neoplasm of unspecified ovary: Secondary | ICD-10-CM | POA: Diagnosis not present

## 2016-07-19 DIAGNOSIS — Z452 Encounter for adjustment and management of vascular access device: Secondary | ICD-10-CM | POA: Diagnosis not present

## 2016-07-19 DIAGNOSIS — G620 Drug-induced polyneuropathy: Secondary | ICD-10-CM

## 2016-07-19 DIAGNOSIS — R109 Unspecified abdominal pain: Secondary | ICD-10-CM

## 2016-07-19 DIAGNOSIS — Z95828 Presence of other vascular implants and grafts: Secondary | ICD-10-CM

## 2016-07-19 LAB — CBC WITH DIFFERENTIAL/PLATELET
BASO%: 0.9 % (ref 0.0–2.0)
BASOS ABS: 0 10*3/uL (ref 0.0–0.1)
EOS%: 6.4 % (ref 0.0–7.0)
Eosinophils Absolute: 0.3 10*3/uL (ref 0.0–0.5)
HCT: 36.7 % (ref 34.8–46.6)
HGB: 12 g/dL (ref 11.6–15.9)
LYMPH#: 0.9 10*3/uL (ref 0.9–3.3)
LYMPH%: 19.5 % (ref 14.0–49.7)
MCH: 29.8 pg (ref 25.1–34.0)
MCHC: 32.7 g/dL (ref 31.5–36.0)
MCV: 91.1 fL (ref 79.5–101.0)
MONO#: 0.3 10*3/uL (ref 0.1–0.9)
MONO%: 7.3 % (ref 0.0–14.0)
NEUT#: 3 10*3/uL (ref 1.5–6.5)
NEUT%: 65.9 % (ref 38.4–76.8)
PLATELETS: 287 10*3/uL (ref 145–400)
RBC: 4.03 10*6/uL (ref 3.70–5.45)
RDW: 19.2 % — ABNORMAL HIGH (ref 11.2–14.5)
WBC: 4.6 10*3/uL (ref 3.9–10.3)

## 2016-07-19 LAB — COMPREHENSIVE METABOLIC PANEL
ALT: 18 U/L (ref 0–55)
AST: 13 U/L (ref 5–34)
Albumin: 3.5 g/dL (ref 3.5–5.0)
Alkaline Phosphatase: 99 U/L (ref 40–150)
Anion Gap: 8 mEq/L (ref 3–11)
BUN: 20.4 mg/dL (ref 7.0–26.0)
CHLORIDE: 99 meq/L (ref 98–109)
CO2: 33 meq/L — AB (ref 22–29)
Calcium: 9.9 mg/dL (ref 8.4–10.4)
Creatinine: 0.9 mg/dL (ref 0.6–1.1)
EGFR: 63 mL/min/{1.73_m2} — ABNORMAL LOW (ref >90)
Glucose: 111 mg/dl (ref 70–140)
POTASSIUM: 4 meq/L (ref 3.5–5.1)
Sodium: 140 mEq/L (ref 136–145)
Total Bilirubin: 0.4 mg/dL (ref 0.20–1.20)
Total Protein: 7 g/dL (ref 6.4–8.3)

## 2016-07-19 MED ORDER — OXYCODONE-ACETAMINOPHEN 5-325 MG PO TABS: 1.0000 | ORAL_TABLET | Freq: Four times a day (QID) | ORAL | 0 refills | Status: DC | PRN

## 2016-07-19 MED ORDER — ALTEPLASE 2 MG IJ SOLR
2.0000 mg | Freq: Once | INTRAMUSCULAR | Status: AC | PRN
  Administered 2016-07-19: 2 mg
  Filled 2016-07-19: qty 2

## 2016-07-19 MED ORDER — IOPAMIDOL (ISOVUE-300) INJECTION 61%
100.0000 mL | Freq: Once | INTRAVENOUS | Status: AC | PRN
  Administered 2016-07-19: 100 mL via INTRAVENOUS

## 2016-07-19 MED ORDER — IOPAMIDOL (ISOVUE-300) INJECTION 61%
INTRAVENOUS | Status: AC
  Filled 2016-07-19: qty 30

## 2016-07-19 MED ORDER — SODIUM CHLORIDE 0.9 % IJ SOLN
10.0000 mL | INTRAMUSCULAR | Status: DC | PRN
  Administered 2016-07-19: 10 mL via INTRAVENOUS
  Filled 2016-07-19: qty 10

## 2016-07-19 MED ORDER — IOPAMIDOL (ISOVUE-300) INJECTION 61%
30.0000 mL | Freq: Once | INTRAVENOUS | Status: DC | PRN
  Administered 2016-07-19: 30 mL via ORAL
  Filled 2016-07-19: qty 30

## 2016-07-19 NOTE — Telephone Encounter (Signed)
Appointments scheduled per 1/10 LOS. Patient given AVS report and calendars with future scheduled appointments. °

## 2016-07-19 NOTE — Progress Notes (Signed)
Patient came in to discuss billing concern she was having. Emailed Elmyra Ricks in billing to express patient's concerns and asked someone in billing to contact her with her contact name and number.

## 2016-07-19 NOTE — Progress Notes (Addendum)
[place] OFFICE PROGRESS NOTE   Diagnosis:  Ovarian cancer  INTERVAL HISTORY:   Lynn Ramirez returns as scheduled. She completed another cycle of Taxol on 07/06/2016. For the past 6-7 weeks she has been having intermittent diarrhea. She does not feel the diarrhea is related to chemotherapy. She will have up to 10 loose stools a day. The episodes of diarrhea are preceded by "belching". She has occasional nausea. No bleeding. For the past week she has been having right-sided low back pain radiating to the low abdomen. She takes tramadol as needed. She reports stable neuropathy symptoms. She has periodic hand cramps.  Objective:  Vital signs in last 24 hours:  Blood pressure (!) 142/70, pulse 92, temperature 97.8 F (36.6 C), temperature source Oral, resp. rate 18, weight 188 lb 14.4 oz (85.7 kg), SpO2 95 %.    HEENT: No thrush or ulcers. Resp: Lungs clear bilaterally. Cardio: Regular rate and rhythm. GI: Abdomen is soft. Marked tenderness right upper abdomen. No hepatomegaly. No apparent ascites. Vascular: No leg edema. Musculoskeletal: Tender over the right flank region. Port-A-Cath without erythema.   Lab Results:  Lab Results  Component Value Date   WBC 4.6 07/19/2016   HGB 12.0 07/19/2016   HCT 36.7 07/19/2016   MCV 91.1 07/19/2016   PLT 287 07/19/2016   NEUTROABS 3.0 07/19/2016    Imaging:  No results found.  Medications: I have reviewed the patient's current medications.  Assessment/Plan: 1. 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, immunohistochemical profile consistent with a GYN primary, elevated CA 125   Staging CTs of the chest, abdomen, and pelvis on 10/06/2014 revealed a loculated right pleural effusion, ascites, and omental/mesenteric thickening  Cytology  from peritoneal fluid 10/13/2014 revealed malignant cells consistent with metastatic adenocarcinoma Biopsy of an omental mass on 11/02/2014 revealed invasive serous carcinoma with psammoma bodies Cycle 1 Taxol/carboplatin 10/28/2014 Cycle 2 Taxol/carboplatin 11/18/2014 Cycle 3 Taxol/carboplatin 12/09/2014 CT scan 12/23/2014 with interval improvement in peritoneal carcinomatosis with near-complete resolution of ascites and decreased omental nodularity. Significant improvement in malignant right pleural effusion. Status post robotic-assisted laparoscopic hysterectomy with bilateral salpingoophorectomy, omentectomy, radical tumor debulking 12/29/2014. Per Dr. Serita Grit office note 01/14/2015 cytoreduction was optimal with residual disease remaining only in the 1 mm implants on the small intestine. 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 Cycle 1 adjuvant Taxol/carboplatin 01/20/2015 Cycle 2 adjuvant Taxol/carboplatin 02/10/2015 Cycle 3 adjuvant Taxol/carboplatin 03/10/2015 CA 125 on 1013 2016-42 CTs of the chest, abdomen, and pelvis 05/31/2015 with no evidence of progressive ovarian cancer CTs of the chest, abdomen, and pelvis 08/07/2015 and 08/08/2015-no evidence of progressive ovarian cancer Peritoneal studding noted at the time of the cholecystectomy procedure 08/09/2015 Elevated CA 125 10/07/2015 CT 10/07/2015 with stricturing at the sigmoid colon, constipation, and omental nodularity Initiation of salvage weekly Taxol chemotherapy 10/13/2015 Taxol changed to every 2 weeks beginning 01/19/2016 due to painful neuropathy. 2. COPD 3. Dyspnea secondary to COPD and the large right pleural effusion, status post  therapeutic thoracentesis procedures 09/30/2014,10/09/2014, and 10/21/2014. Left thoracentesis 10/30/2014 4. Left nephrectomy as a child 5. Delayed nausea following cycle 1 Taxol/carboplatin, Aloxi/Emend added with cycle 2 with improvement. 6. Right lower extremity edema, right calf pain 12/09/2014. Negative venous Doppler 12/09/2014. 7. Diffuse pruritus following cycle 1 adjuvant Taxol/carboplatin 01/20/2015,  no rash, resolved with a steroid dose pack 8. Thrombocytopenia second to chemotherapy, the carboplatin was dose reduced with cycle 2 adjuvant Taxol/carboplatin 02/10/2015 9. Chemotherapy-induced peripheral neuropathy-painful peripheral neuropathy involving the feet 01/19/2016.  10. Admission with acute cholecystitis 08/07/2015, status post a cholecystectomy 08/09/2015 11. Admission 10/08/2015 with abdominal pain/constipation, improved with bowel rest and laxatives 12. Mild right hydronephrosis noted on the CT 10/07/2015. Renal ultrasound 12/16/2015 with no hydronephrosis noted.   Disposition: Lynn Ramirez is currently on active treatment with Taxol every 2 weeks. She is having intermittent diarrhea and more recent onset of right flank and right upper abdominal pain. We are holding today's chemotherapy and referring her for CT scans of the abdomen/pelvis. She was given a prescription for Percocet. She will return for a follow-up visit on 07/21/2016. She will contact the office in the interim with any problems.  Patient seen with Dr. Benay Spice. 25 minutes were spent face-to-face at today's visit with the majority of that time involved in counseling/coordination of care.    Ned Card ANP/GNP-BC   07/19/2016  11:59 AM  This was a shared visit with Ned Card. Lynn Ramirez was interviewed and examined. We are concerned she has developed disease progression in the abdomen. She will be referred for a restaging CT and return for an office visit on 07/21/2016.  Julieanne Manson,  M.D.

## 2016-07-20 LAB — CA 125: Cancer Antigen (CA) 125: 66.6 U/mL — ABNORMAL HIGH (ref 0.0–38.1)

## 2016-07-21 ENCOUNTER — Ambulatory Visit (HOSPITAL_BASED_OUTPATIENT_CLINIC_OR_DEPARTMENT_OTHER): Payer: Medicare Other | Admitting: Nurse Practitioner

## 2016-07-21 ENCOUNTER — Encounter: Payer: Self-pay | Admitting: *Deleted

## 2016-07-21 ENCOUNTER — Telehealth: Payer: Self-pay | Admitting: Nurse Practitioner

## 2016-07-21 VITALS — BP 134/67 | HR 103 | Temp 98.2°F | Resp 18 | Wt 188.0 lb

## 2016-07-21 DIAGNOSIS — D6959 Other secondary thrombocytopenia: Secondary | ICD-10-CM

## 2016-07-21 DIAGNOSIS — C5701 Malignant neoplasm of right fallopian tube: Secondary | ICD-10-CM

## 2016-07-21 DIAGNOSIS — J910 Malignant pleural effusion: Secondary | ICD-10-CM | POA: Diagnosis not present

## 2016-07-21 DIAGNOSIS — G620 Drug-induced polyneuropathy: Secondary | ICD-10-CM | POA: Diagnosis not present

## 2016-07-21 DIAGNOSIS — C786 Secondary malignant neoplasm of retroperitoneum and peritoneum: Secondary | ICD-10-CM | POA: Diagnosis not present

## 2016-07-21 DIAGNOSIS — C569 Malignant neoplasm of unspecified ovary: Secondary | ICD-10-CM

## 2016-07-21 NOTE — Progress Notes (Addendum)
[facility] OFFICE PROGRESS NOTE   Diagnosis:  Ovarian cancer  INTERVAL HISTORY:   Ms. Hollway returns as scheduled. She was last seen on 07/19/2016. Chemotherapy was held that day due to diarrhea and right-sided low back/abdominal pain. She was referred for CT scans.  She continues to have right flank pain. The right upper abdominal pain is somewhat better. She has had no diarrhea for the past 4 days. She is having intermittent nausea. No dysuria, frequency or urgency. No fever.  Objective:  Vital signs in last 24 hours:  Blood pressure 134/67, pulse (!) 103, temperature 98.2 F (36.8 C), temperature source Oral, resp. rate 18, weight 188 lb (85.3 kg), SpO2 98 %.   Resp: Lungs clear bilaterally. Cardio: Regular rate and rhythm. GI: Abdomen is soft. Tender at the right upper abdomen. No hepatomegaly. No mass. Vascular: No leg edema. Musculoskeletal: Tender right flank/lower ribs.  Skin: No rash. Port-A-Cath without erythema.    Lab Results:  Lab Results  Component Value Date   WBC 4.6 07/19/2016   HGB 12.0 07/19/2016   HCT 36.7 07/19/2016   MCV 91.1 07/19/2016   PLT 287 07/19/2016   NEUTROABS 3.0 07/19/2016    Imaging:  Ct Abdomen Pelvis W Contrast  Result Date: 07/19/2016 CLINICAL DATA:  Right upper quadrant and right flank pain. Intermittent diarrhea. History of ovarian cancer. EXAM: CT ABDOMEN AND PELVIS WITH CONTRAST TECHNIQUE: Multidetector CT imaging of the abdomen and pelvis was performed using the standard protocol following bolus administration of intravenous contrast. CONTRAST:  148mL ISOVUE-300 IOPAMIDOL (ISOVUE-300) INJECTION 61% COMPARISON:  10/07/2015. FINDINGS: Lower chest: Probable scarring in the medial right middle lobe is unchanged. Hepatobiliary: No focal abnormality within the liver parenchyma. Gallbladder surgically absent. No intrahepatic or extrahepatic biliary dilation. Pancreas: No focal mass lesion. No dilatation of the main  duct. No intraparenchymal cyst. No peripancreatic edema. Spleen: No splenomegaly. No focal mass lesion. Adrenals/Urinary Tract: 16 mm left adrenal nodule is unchanged since most recent comparison and appears decreased since 10/06/2014. Average attenuation is -14 Hounsfield units, suggesting adenoma. Right adrenal gland is unremarkable. Solitary right kidney again noted. Mild fullness right intrarenal collecting system is stable. No right hydroureteronephrosis. The urinary bladder appears normal for the degree of distention. Stomach/Bowel: Stomach is nondistended. No gastric wall thickening. No evidence of outlet obstruction. Duodenum is normally positioned as is the ligament of Treitz. No small bowel wall thickening. No small bowel dilatation. The terminal ileum is normal. The appendix is not visualized, but there is no edema or inflammation in the region of the cecum. Diverticular changes are noted in the left colon without evidence of diverticulitis. Vascular/Lymphatic: There is abdominal aortic atherosclerosis without aneurysm. There is no gastrohepatic or hepatoduodenal ligament lymphadenopathy. No intraperitoneal or retroperitoneal lymphadenopathy. No pelvic sidewall lymphadenopathy. Reproductive: Uterus is surgically absent. There is no adnexal mass. Other: No intraperitoneal free fluid. Musculoskeletal: Marked pelvic floor laxity is evident. Paraumbilical ventral hernia contains a knuckle of small bowel without small bowel wall thickening or evidence of obstruction. Degenerative changes noted left hip. Bone windows reveal no worrisome lytic or sclerotic osseous lesions. IMPRESSION: 1. No acute findings. 2. No features in the abdomen or pelvis to suggest recurrent disease. 3. Colonic diverticulosis with no features of diverticulitis. The distal sigmoid colon narrowing described on the previous study is less evident on today's exam. 4. Mild fullness right intrarenal collecting system and renal pelvis again  noted. Component of underlying UPJ obstruction not excluded. 5. No change in small left adrenal  nodule compatible with adenoma. 6.  Abdominal Aortic Atherosclerois (ICD10-170.0) Electronically Signed   By: Misty Stanley M.D.   On: 07/19/2016 17:18    Medications: I have reviewed the patient's current medications.  Assessment/Plan: 1. 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, immunohistochemical profile consistent with a GYN primary, elevated CA 125   Staging CTs of the chest, abdomen, and pelvis on 10/06/2014 revealed a loculated right pleural effusion, ascites, and omental/mesenteric thickening  Cytology from peritoneal fluid 10/13/2014 revealed malignant cells consistent with metastatic adenocarcinoma Biopsy of an omental mass on 11/02/2014 revealed invasive serous carcinoma with psammoma bodies Cycle 1 Taxol/carboplatin 10/28/2014 Cycle 2 Taxol/carboplatin 11/18/2014 Cycle 3 Taxol/carboplatin 12/09/2014 CT scan 12/23/2014 with interval improvement in peritoneal carcinomatosis with near-complete resolution of ascites and decreased omental nodularity. Significant improvement in malignant right pleural effusion. Status post robotic-assisted laparoscopic hysterectomy with bilateral salpingoophorectomy, omentectomy, radical tumor debulking 12/29/2014. Per Dr. Serita Grit office note 01/14/2015 cytoreduction was optimal with residual disease remaining only in the 1 mm implants on the small intestine. Pathology on the omentum showed high-grade serous carcinoma; papillary high-grade serous carcinoma arising from the right fallopian tube; high-grade serous carcinoma involving the right ovary; high-grade serous carcinoma involving paratubal soft tissue of the left fallopian tube; high-grade serous carcinoma involving left  ovary. Cycle 1 adjuvant Taxol/carboplatin 01/20/2015 Cycle 2 adjuvant Taxol/carboplatin 02/10/2015 Cycle 3 adjuvant Taxol/carboplatin 03/10/2015 CA 125 on 1013 2016-42 CTs of the chest, abdomen, and pelvis 05/31/2015 with no evidence of progressive ovarian cancer CTs of the chest, abdomen, and pelvis 08/07/2015 and 08/08/2015-no evidence of progressive ovarian cancer Peritoneal studding noted at the time of the cholecystectomy procedure 08/09/2015 Elevated CA 125 10/07/2015 CT 10/07/2015 with stricturing at the sigmoid colon, constipation, and omental nodularity Initiation of salvage weekly Taxol chemotherapy 10/13/2015 Taxol changed to every 2 weeks beginning 01/19/2016 due to painful neuropathy. CT scans 07/19/2016 with no acute findings. No features in the abdomen or pelvis to suggest recurrent disease. Stable mild fullness right intrarenal collecting system. 2. COPD 3. Dyspnea secondary to COPD and the large right pleural effusion, status post therapeutic thoracentesis procedures 09/30/2014,10/09/2014, and 10/21/2014. Left thoracentesis 10/30/2014 4. Left nephrectomy as a child 5. Delayed nausea following cycle 1 Taxol/carboplatin, Aloxi/Emend added with cycle 2 with improvement. 6. Right lower extremity edema, right calf pain 12/09/2014. Negative venous Doppler 12/09/2014. 7. Diffuse pruritus following cycle 1 adjuvant Taxol/carboplatin 01/20/2015, no rash, resolved with a steroid dose pack 8. Thrombocytopenia second to chemotherapy, the carboplatin was dose reduced with cycle 2 adjuvant Taxol/carboplatin 02/10/2015 9. Chemotherapy-induced peripheral neuropathy-painful peripheral neuropathy involving the feet 01/19/2016.  10. Admission with acute cholecystitis 08/07/2015, status post a cholecystectomy 08/09/2015 11. Admission 10/08/2015 with abdominal pain/constipation, improved with bowel rest and laxatives 12. Mild right hydronephrosis noted on the CT 10/07/2015. Renal ultrasound  12/16/2015 with no hydronephrosis noted. No hydronephrosis on CT 07/19/2016.   Disposition: Ms. Inocencio appears stable. The recent CT scans show no evidence of cancer or a specific cause for the right flank pain. We discussed that she may have fractured a rib. We reviewed the CA-125 result from 07/19/2016 showing mild further increase. Dr. Benay Spice discussed options to include continuation of Taxol, continuation of Taxol with addition of Avastin, treatment break. It was mutually decided to proceed with a treatment break.  For the pain she will continue tramadol. She will contact the office if the pain worsens.  She will return for labs and a follow-up visit in 4 weeks.  Patient seen with Dr. Benay Spice.  CT images reviewed on the computer with Ms. Patzke and her husband. 25 minutes were spent face-to-face at today's visit with the majority of that time involved in counseling/coordination of care.    Ned Card ANP/GNP-BC   07/21/2016  12:24 PM  This was a shared visit with Ned Card. Ms. Potenza was interviewed and examined. There is no explanation for her pain on the CT and the CT shows no evidence of progressive ovarian cancer. We reviewed the CT images. The CA 125 is slightly higher while on every 2 week Taxol therapy. We decided to follow her with observation with the plan to add Avastin based therapy if there is clinical evidence of disease progression.  Julieanne Manson, M.D.

## 2016-07-21 NOTE — Telephone Encounter (Signed)
Appointments scheduled per 11/2 LOS. Patient given AVS report and calendars with future scheduled appointments °

## 2016-07-27 ENCOUNTER — Other Ambulatory Visit: Payer: Self-pay | Admitting: Cardiology

## 2016-07-27 DIAGNOSIS — I25119 Atherosclerotic heart disease of native coronary artery with unspecified angina pectoris: Secondary | ICD-10-CM

## 2016-07-27 DIAGNOSIS — E785 Hyperlipidemia, unspecified: Secondary | ICD-10-CM

## 2016-08-07 ENCOUNTER — Encounter: Payer: Self-pay | Admitting: Family Medicine

## 2016-08-07 ENCOUNTER — Ambulatory Visit (INDEPENDENT_AMBULATORY_CARE_PROVIDER_SITE_OTHER): Payer: Medicare Other | Admitting: Family Medicine

## 2016-08-07 ENCOUNTER — Encounter: Payer: Self-pay | Admitting: General Practice

## 2016-08-07 VITALS — BP 130/71 | HR 98 | Temp 97.9°F | Resp 16 | Ht 61.0 in | Wt 189.2 lb

## 2016-08-07 DIAGNOSIS — I25119 Atherosclerotic heart disease of native coronary artery with unspecified angina pectoris: Secondary | ICD-10-CM

## 2016-08-07 DIAGNOSIS — I10 Essential (primary) hypertension: Secondary | ICD-10-CM | POA: Diagnosis not present

## 2016-08-07 DIAGNOSIS — E538 Deficiency of other specified B group vitamins: Secondary | ICD-10-CM | POA: Diagnosis not present

## 2016-08-07 DIAGNOSIS — E559 Vitamin D deficiency, unspecified: Secondary | ICD-10-CM

## 2016-08-07 DIAGNOSIS — R252 Cramp and spasm: Secondary | ICD-10-CM | POA: Diagnosis not present

## 2016-08-07 DIAGNOSIS — J449 Chronic obstructive pulmonary disease, unspecified: Secondary | ICD-10-CM | POA: Diagnosis not present

## 2016-08-07 DIAGNOSIS — E785 Hyperlipidemia, unspecified: Secondary | ICD-10-CM

## 2016-08-07 DIAGNOSIS — C482 Malignant neoplasm of peritoneum, unspecified: Secondary | ICD-10-CM | POA: Diagnosis not present

## 2016-08-07 LAB — VITAMIN D 25 HYDROXY (VIT D DEFICIENCY, FRACTURES): VITD: 33.11 ng/mL (ref 30.00–100.00)

## 2016-08-07 LAB — VITAMIN B12: VITAMIN B 12: 930 pg/mL — AB (ref 211–911)

## 2016-08-07 LAB — MAGNESIUM: Magnesium: 1.9 mg/dL (ref 1.5–2.5)

## 2016-08-07 NOTE — Patient Instructions (Addendum)
Schedule your Medicare Annual Wellness Visit w/ Maudie Mercury in 6 months and also a follow up w/ me We'll notify you of your lab results and make any changes if needed Continue to work on healthy diet and regular exercise (as you are able)- you look great! Call with any questions or concerns Welcome!  We're glad to have you!!!

## 2016-08-07 NOTE — Assessment & Plan Note (Signed)
New to provider, ongoing for pt.  Following w/ Dr Ammie Dalton.  Pt thinks the next step is to start Avastin.  Will review records and follow along- assisting as able.

## 2016-08-07 NOTE — Assessment & Plan Note (Signed)
New to provider, chronic problem for pt.  Tolerating statin w/o difficulty.  Stressed need for healthy diet and exercise as able.  Recent LDL shows good control.  No need to repeat at this time.

## 2016-08-07 NOTE — Progress Notes (Signed)
   Subjective:    Patient ID: Lynn Ramirez, female    DOB: [birth year], 76 y.o.   MRN: [id number]  HPI New to establish.  Previous MD- [facility], [facility]  UTD on mammo, DEXA, colonoscopy  Hyperlipidemia- chronic problem, on Lipitor 10mg  daily.  Denies abd pain today- will have intermittently.  no N/V.  Lipids are well controlled- LDL 83.  COPD- chronic problem, on Symbicort daily and Proair prn.  Does not require O2.    HTN-  Chronic problem, on Maxzide w/ good BP control.  No CP, SOB above baseline, HAs, visual changes, edema.  Ovarian Cancer- pt has metastatic ovarian cancer w/ carcinomatosis.  Following w/ Dr Ammie Dalton.  Pt is thinking that Dr Ammie Dalton will start Avastin next week.  Pt lives w/ husband and has local daughter and then 1 daughter and 1 son in [place].  Very supportive friends and church congregation.  Going to Ovarian Cancer support group.  B12 deficiency- previously needed injxns.  Now taking OTC supplement  Vit D deficiency- pt has hx of this, due for repeat labs  Hand cramps- pt reports this usually happens when Magnesium is low.  Improves w/ Tonic water   Review of Systems For ROS see HPI     Objective:   Physical Exam  Constitutional: She is oriented to person, place, and time. She appears well-developed and well-nourished. No distress.  HENT:  Head: Normocephalic and atraumatic.  Eyes: Conjunctivae and EOM are normal. Pupils are equal, round, and reactive to light.  Neck: Normal range of motion. Neck supple. No thyromegaly present.  Cardiovascular: Normal rate, regular rhythm, normal heart sounds and intact distal pulses.   No murmur heard. Pulmonary/Chest: Effort normal and breath sounds normal. No respiratory distress.  Abdominal: Soft. She exhibits no distension. There is no tenderness.  Musculoskeletal: She exhibits no edema.  Lymphadenopathy:    She has no cervical adenopathy.  Neurological: She is alert and oriented to person, place, and time.  Skin:  Skin is warm and dry.  Psychiatric: She has a normal mood and affect. Her behavior is normal.  Vitals reviewed.         Assessment & Plan:

## 2016-08-07 NOTE — Assessment & Plan Note (Signed)
New to provider, ongoing for pt.  On Maxzide w/ adequate control.  Asymptomatic at this time.  Reviewed recent labs.  No med changes at this time

## 2016-08-07 NOTE — Progress Notes (Signed)
Pre visit review using our clinic review tool, if applicable. No additional management support is needed unless otherwise documented below in the visit note. 

## 2016-08-07 NOTE — Assessment & Plan Note (Signed)
New to provider but pt has hx of this.  Check labs and replete prn.

## 2016-08-07 NOTE — Assessment & Plan Note (Signed)
New to provider, ongoing for pt.  Improves w/ tonic water.  Pt is concerned about Magnesium levels.  Check and replete prn.

## 2016-08-07 NOTE — Assessment & Plan Note (Signed)
New to provider, ongoing for pt.  Following w/ Dr Lamonte Sakai.  sxs are currently well controlled.  Not O2 requiring.  Will follow along and assist as able.

## 2016-08-11 ENCOUNTER — Other Ambulatory Visit: Payer: Medicare Other

## 2016-08-14 ENCOUNTER — Other Ambulatory Visit: Payer: Self-pay | Admitting: Emergency Medicine

## 2016-08-15 ENCOUNTER — Encounter: Payer: Self-pay | Admitting: General Practice

## 2016-08-16 ENCOUNTER — Other Ambulatory Visit: Payer: Self-pay | Admitting: Oncology

## 2016-08-16 ENCOUNTER — Other Ambulatory Visit (HOSPITAL_BASED_OUTPATIENT_CLINIC_OR_DEPARTMENT_OTHER): Payer: Medicare Other

## 2016-08-16 ENCOUNTER — Ambulatory Visit (HOSPITAL_BASED_OUTPATIENT_CLINIC_OR_DEPARTMENT_OTHER): Payer: Medicare Other | Admitting: Oncology

## 2016-08-16 ENCOUNTER — Ambulatory Visit (HOSPITAL_BASED_OUTPATIENT_CLINIC_OR_DEPARTMENT_OTHER): Payer: Medicare Other

## 2016-08-16 ENCOUNTER — Other Ambulatory Visit: Payer: Self-pay | Admitting: *Deleted

## 2016-08-16 ENCOUNTER — Telehealth: Payer: Self-pay | Admitting: Oncology

## 2016-08-16 VITALS — BP 147/73 | HR 88 | Temp 98.4°F | Resp 18 | Ht 61.0 in | Wt 188.7 lb

## 2016-08-16 DIAGNOSIS — C482 Malignant neoplasm of peritoneum, unspecified: Secondary | ICD-10-CM | POA: Diagnosis not present

## 2016-08-16 DIAGNOSIS — Z452 Encounter for adjustment and management of vascular access device: Secondary | ICD-10-CM

## 2016-08-16 DIAGNOSIS — Z95828 Presence of other vascular implants and grafts: Secondary | ICD-10-CM

## 2016-08-16 DIAGNOSIS — C5701 Malignant neoplasm of right fallopian tube: Secondary | ICD-10-CM

## 2016-08-16 DIAGNOSIS — C799 Secondary malignant neoplasm of unspecified site: Secondary | ICD-10-CM

## 2016-08-16 DIAGNOSIS — C786 Secondary malignant neoplasm of retroperitoneum and peritoneum: Secondary | ICD-10-CM

## 2016-08-16 DIAGNOSIS — C561 Malignant neoplasm of right ovary: Secondary | ICD-10-CM

## 2016-08-16 DIAGNOSIS — C569 Malignant neoplasm of unspecified ovary: Secondary | ICD-10-CM | POA: Diagnosis not present

## 2016-08-16 LAB — COMPREHENSIVE METABOLIC PANEL
ALT: 16 U/L (ref 0–55)
ANION GAP: 8 meq/L (ref 3–11)
AST: 12 U/L (ref 5–34)
Albumin: 3.7 g/dL (ref 3.5–5.0)
Alkaline Phosphatase: 90 U/L (ref 40–150)
BILIRUBIN TOTAL: 0.33 mg/dL (ref 0.20–1.20)
BUN: 25.1 mg/dL (ref 7.0–26.0)
CHLORIDE: 100 meq/L (ref 98–109)
CO2: 32 meq/L — AB (ref 22–29)
Calcium: 10 mg/dL (ref 8.4–10.4)
Creatinine: 1 mg/dL (ref 0.6–1.1)
EGFR: 59 mL/min/{1.73_m2} — AB (ref >90)
GLUCOSE: 116 mg/dL (ref 70–140)
POTASSIUM: 4 meq/L (ref 3.5–5.1)
SODIUM: 140 meq/L (ref 136–145)
TOTAL PROTEIN: 7.1 g/dL (ref 6.4–8.3)

## 2016-08-16 LAB — CBC WITH DIFFERENTIAL/PLATELET
BASO%: 0.4 % (ref 0.0–2.0)
Basophils Absolute: 0 10*3/uL (ref 0.0–0.1)
EOS ABS: 0.2 10*3/uL (ref 0.0–0.5)
EOS%: 3.4 % (ref 0.0–7.0)
HCT: 40.4 % (ref 34.8–46.6)
HGB: 12.2 g/dL (ref 11.6–15.9)
LYMPH%: 17.1 % (ref 14.0–49.7)
MCH: 29.3 pg (ref 25.1–34.0)
MCHC: 30.2 g/dL — AB (ref 31.5–36.0)
MCV: 97.1 fL (ref 79.5–101.0)
MONO#: 0.4 10*3/uL (ref 0.1–0.9)
MONO%: 7.8 % (ref 0.0–14.0)
NEUT%: 71.3 % (ref 38.4–76.8)
NEUTROS ABS: 3.9 10*3/uL (ref 1.5–6.5)
Platelets: 247 10*3/uL (ref 145–400)
RBC: 4.16 10*6/uL (ref 3.70–5.45)
RDW: 17.1 % — ABNORMAL HIGH (ref 11.2–14.5)
WBC: 5.5 10*3/uL (ref 3.9–10.3)
lymph#: 0.9 10*3/uL (ref 0.9–3.3)

## 2016-08-16 MED ORDER — SODIUM CHLORIDE 0.9 % IJ SOLN
10.0000 mL | INTRAMUSCULAR | Status: DC | PRN
  Administered 2016-08-16: 10 mL via INTRAVENOUS
  Filled 2016-08-16: qty 10

## 2016-08-16 MED ORDER — TRAMADOL HCL 50 MG PO TABS: 50.0000 mg | ORAL_TABLET | Freq: Two times a day (BID) | ORAL | 0 refills | Status: DC | PRN

## 2016-08-16 MED ORDER — HEPARIN SOD (PORK) LOCK FLUSH 100 UNIT/ML IV SOLN
500.0000 [IU] | Freq: Once | INTRAVENOUS | Status: AC | PRN
  Administered 2016-08-16: 500 [IU] via INTRAVENOUS
  Filled 2016-08-16: qty 5

## 2016-08-16 NOTE — Telephone Encounter (Signed)
Appointments scheduled per 08/16/16 los. Patient was given a copy of the AVS report and appointment schedule per 08/16/16 los. ° °

## 2016-08-16 NOTE — Progress Notes (Signed)
[facility] OFFICE PROGRESS NOTE   Diagnosis: Ovarian cancer  INTERVAL HISTORY:   Mr. Ratay returns as scheduled. She no longer has right flank pain. She complains of bilateral thigh pain that improves partially with activity. No leg swelling. The neuropathy symptoms have improved. Good appetite.  Objective:  Vital signs in last 24 hours:  Blood pressure (!) 147/73, pulse 88, temperature 98.4 F (36.9 C), temperature source Oral, resp. rate 18, height 5\' 1"  (1.549 m), weight 188 lb 11.2 oz (85.6 kg), SpO2 95 %.   Resp: Distant breath sounds, no respiratory distress Cardio: Regular rate and rhythm GI: No hepatomegaly, no mass, no apparent ascites, nontender Vascular: No leg edema Neuro: The motor exam is intact in the legs and feet bilaterally, she is able to ambulate  Musculoskeletal: Tenderness over the thighs bilaterally, no mass or rash   Portacath/PICC-without erythema  Lab Results:  Lab Results  Component Value Date   WBC 5.5 08/16/2016   HGB 12.2 08/16/2016   HCT 40.4 08/16/2016   MCV 97.1 08/16/2016   PLT 247 08/16/2016   NEUTROABS 3.9 08/16/2016     Medications: I have reviewed the patient's current medications.  Assessment/Plan: 1. 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, immunohistochemical profile consistent with a GYN primary, elevated CA 125   Staging CTs of the chest, abdomen, and pelvis on 10/06/2014 revealed a loculated right pleural effusion, ascites, and omental/mesenteric thickening  Cytology from peritoneal fluid 10/13/2014 revealed malignant cells consistent with metastatic adenocarcinoma Biopsy of an omental mass on 11/02/2014 revealed invasive serous carcinoma with psammoma bodies Cycle 1 Taxol/carboplatin 10/28/2014 Cycle 2 Taxol/carboplatin  11/18/2014 Cycle 3 Taxol/carboplatin 12/09/2014 CT scan 12/23/2014 with interval improvement in peritoneal carcinomatosis with near-complete resolution of ascites and decreased omental nodularity. Significant improvement in malignant right pleural effusion. Status post robotic-assisted laparoscopic hysterectomy with bilateral salpingoophorectomy, omentectomy, radical tumor debulking 12/29/2014. Per Dr. Serita Grit office note 01/14/2015 cytoreduction was optimal with residual disease remaining only in the 1 mm implants on the small intestine. 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 Cycle 1 adjuvant Taxol/carboplatin 01/20/2015 Cycle 2 adjuvant Taxol/carboplatin 02/10/2015 Cycle 3 adjuvant Taxol/carboplatin 03/10/2015 CA 125 on 1013 2016-42 CTs of the chest, abdomen, and pelvis 05/31/2015 with no evidence of progressive ovarian cancer CTs of the chest, abdomen, and pelvis 08/07/2015 and 08/08/2015-no evidence of progressive ovarian cancer Peritoneal studding noted at the time of the cholecystectomy procedure 08/09/2015 Elevated CA 125 10/07/2015 CT 10/07/2015 with stricturing at the sigmoid colon, constipation, and omental nodularity Initiation of salvage weekly Taxol chemotherapy 10/13/2015 Taxol changed to every 2 weeks beginning 01/19/2016 due to painful neuropathy. CT scans 07/19/2016 with no acute findings. No features in the abdomen or pelvis to suggest recurrent disease. Stable mild fullness right intrarenal collecting system. 2. COPD 3. Dyspnea secondary to COPD and the large right pleural effusion, status post therapeutic thoracentesis procedures 09/30/2014,10/09/2014, and 10/21/2014. Left thoracentesis  10/30/2014 4. Left nephrectomy as a child 5. Delayed nausea following cycle 1 Taxol/carboplatin, Aloxi/Emend added with cycle 2 with improvement. 6. Right lower extremity edema, right calf pain 12/09/2014. Negative venous Doppler 12/09/2014. 7. Diffuse pruritus following cycle 1 adjuvant Taxol/carboplatin 01/20/2015, no rash, resolved with a steroid dose pack 8. Thrombocytopenia second to chemotherapy, the carboplatin was dose reduced with cycle  2 adjuvant Taxol/carboplatin 02/10/2015 9. Chemotherapy-induced peripheral neuropathy-painful peripheral neuropathy involving the feet 01/19/2016.  10. Admission with acute cholecystitis 08/07/2015, status post a cholecystectomy 08/09/2015 11. Admission 10/08/2015 with abdominal pain/constipation, improved with bowel rest and laxatives 12. Mild right hydronephrosis noted on the CT 10/07/2015. Renal ultrasound 12/16/2015 with no hydronephrosis noted. No hydronephrosis on CT 07/19/2016.    Disposition:  Ms. Obryant has advanced stage ovarian cancer. She is now maintained off of specific therapy. Her overall status appears unchanged. The etiology of the bilateral thigh discomfort is unclear. I doubt this is related to the ovarian cancer or treatment. We will check an erythrocyte sedimentation rate and treat with low-dose prednisone if the sedimentation rate is markedly elevated.  She will return for an office visit in 3 weeks.  We discussed the pathology report from the debulking surgery and future treatment options.  25 minutes were spent with the patient today. The majority of the time was used for counseling and coordination of care.  Betsy Coder, MD  08/16/2016  10:42 AM

## 2016-08-17 ENCOUNTER — Telehealth: Payer: Self-pay | Admitting: *Deleted

## 2016-08-17 LAB — SEDIMENTATION RATE: SED RATE: 21 mm/h (ref 0–40)

## 2016-08-17 LAB — CA 125: Cancer Antigen (CA) 125: 109.2 U/mL — ABNORMAL HIGH (ref 0.0–38.1)

## 2016-08-17 NOTE — Telephone Encounter (Signed)
Call placed to patient to inform her per Dr. Benay Spice that there is no change in plan at this time and to f/u as scheduled with Dr. Benay Spice on 09/04/16.  Patient appreciative of call back and has no questions at this time.

## 2016-08-17 NOTE — Telephone Encounter (Signed)
"  I was in yesterday.  Would like to know CA-125 and Sed rate results.  Results are not on MyChart yet.  I also need to know future plan."  Provided results of CA-125 = 109.2, sed rate = 21.  Patient awaiting return call with plan.

## 2016-08-29 DIAGNOSIS — H2513 Age-related nuclear cataract, bilateral: Secondary | ICD-10-CM | POA: Diagnosis not present

## 2016-09-04 ENCOUNTER — Ambulatory Visit (HOSPITAL_COMMUNITY)
Admission: RE | Admit: 2016-09-04 | Discharge: 2016-09-04 | Disposition: A | Discharge status: Home / Self Care | Payer: Medicare Other | Source: Ambulatory Visit | Attending: Nurse Practitioner | Admitting: Nurse Practitioner

## 2016-09-04 ENCOUNTER — Telehealth: Payer: Self-pay | Admitting: Gynecologic Oncology

## 2016-09-04 ENCOUNTER — Ambulatory Visit (HOSPITAL_BASED_OUTPATIENT_CLINIC_OR_DEPARTMENT_OTHER): Payer: Medicare Other | Admitting: Nurse Practitioner

## 2016-09-04 ENCOUNTER — Other Ambulatory Visit: Payer: Medicare Other

## 2016-09-04 ENCOUNTER — Telehealth: Payer: Self-pay | Admitting: Nurse Practitioner

## 2016-09-04 VITALS — BP 152/71 | HR 96 | Temp 97.9°F | Resp 18 | Wt 188.2 lb

## 2016-09-04 DIAGNOSIS — R10819 Abdominal tenderness, unspecified site: Secondary | ICD-10-CM | POA: Diagnosis not present

## 2016-09-04 DIAGNOSIS — R109 Unspecified abdominal pain: Secondary | ICD-10-CM | POA: Diagnosis not present

## 2016-09-04 DIAGNOSIS — Z95828 Presence of other vascular implants and grafts: Secondary | ICD-10-CM | POA: Insufficient documentation

## 2016-09-04 DIAGNOSIS — C569 Malignant neoplasm of unspecified ovary: Secondary | ICD-10-CM

## 2016-09-04 DIAGNOSIS — S42301S Unspecified fracture of shaft of humerus, right arm, sequela: Secondary | ICD-10-CM | POA: Insufficient documentation

## 2016-09-04 DIAGNOSIS — J910 Malignant pleural effusion: Secondary | ICD-10-CM | POA: Diagnosis not present

## 2016-09-04 DIAGNOSIS — C5701 Malignant neoplasm of right fallopian tube: Secondary | ICD-10-CM

## 2016-09-04 DIAGNOSIS — R0600 Dyspnea, unspecified: Secondary | ICD-10-CM

## 2016-09-04 DIAGNOSIS — Z8543 Personal history of malignant neoplasm of ovary: Secondary | ICD-10-CM | POA: Diagnosis not present

## 2016-09-04 DIAGNOSIS — R05 Cough: Secondary | ICD-10-CM | POA: Diagnosis not present

## 2016-09-04 DIAGNOSIS — R0602 Shortness of breath: Secondary | ICD-10-CM | POA: Insufficient documentation

## 2016-09-04 NOTE — Telephone Encounter (Signed)
Patient completed ROI form in office today, verified NCDL and scanned form in to chart. requested medical records which I gave patient in office.

## 2016-09-04 NOTE — Telephone Encounter (Signed)
Appointments scheduled per 2/26 LOS. Patient given AVS report and calendars with future scheduled appointments. °

## 2016-09-04 NOTE — Progress Notes (Addendum)
[place] OFFICE PROGRESS NOTE   Diagnosis:  Ovarian cancer  INTERVAL HISTORY:   Lynn Ramirez returns as scheduled. She reports intermittent right-sided abdominal pain for the past week. Yesterday was a "good day". She woke up this morning with increased abdominal pain. She takes tramadol as needed. Bowels moving regularly. She does not feel distended. No nausea or vomiting. She has noted increased dyspnea for the past 2 weeks. She has decreased numbness in the hands and feet. She continues to have bilateral upper leg pain, described as "achy".  Objective:  Vital signs in last 24 hours:  Blood pressure (!) 152/71, pulse 96, temperature 97.9 F (36.6 C), temperature source Oral, resp. rate 18, weight 188 lb 3.2 oz (85.4 kg), SpO2 93 %.    HEENT: No thrush or ulcers. Resp: Distant breath sounds. No respiratory distress. Cardio: Regular rate and rhythm. GI: Abdomen is soft. Tender over the right abdomen. No hepatomegaly. No mass. No apparent ascites. Vascular: No leg edema. Portacath without erythema.  Lab Results:  Lab Results  Component Value Date   WBC 5.5 08/16/2016   HGB 12.2 08/16/2016   HCT 40.4 08/16/2016   MCV 97.1 08/16/2016   PLT 247 08/16/2016   NEUTROABS 3.9 08/16/2016    Imaging:  No results found.  Medications: I have reviewed the patient's current medications.  Assessment/Plan: 1. 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 Significant improvement in malignant right pleural effusion. Status post robotic-assisted laparoscopic hysterectomy with bilateral salpingoophorectomy, omentectomy, radical tumor debulking 12/29/2014. Per Dr. Serita Grit office note 01/14/2015 cytoreduction was optimal with residual disease remaining only in the 1 mm implants on the small intestine. 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 Cycle 1 adjuvant Taxol/carboplatin 01/20/2015 Cycle 2 adjuvant Taxol/carboplatin 02/10/2015 Cycle 3 adjuvant Taxol/carboplatin 03/10/2015 CA 125 on 1013 2016-42 CTs of the chest, abdomen, and pelvis 05/31/2015 with no evidence of progressive ovarian cancer CTs of the chest, abdomen, and pelvis 08/07/2015 and 08/08/2015-no evidence of progressive ovarian cancer Peritoneal studding noted at the time of the cholecystectomy procedure 08/09/2015 Elevated CA 125 10/07/2015 CT 10/07/2015 with stricturing at the sigmoid colon, constipation, and omental nodularity Initiation of salvage weekly Taxol chemotherapy 10/13/2015 Taxol changed to every 2 weeks beginning 01/19/2016 due to painful neuropathy. CT scans 07/19/2016 with no acute findings. No features in the abdomen or pelvis to suggest recurrent disease.Stable mild fullness right intrarenal collecting system. 2. COPD 3. Dyspnea secondary to  COPD and the large right pleural effusion, status post therapeutic thoracentesis procedures 09/30/2014,10/09/2014, and 10/21/2014. Left thoracentesis 10/30/2014 4. Left nephrectomy as a child 5. Delayed nausea following cycle 1 Taxol/carboplatin, Aloxi/Emend added with cycle 2 with improvement. 6. Right lower extremity edema, right calf pain 12/09/2014. Negative venous Doppler 12/09/2014. 7. Diffuse  pruritus following cycle 1 adjuvant Taxol/carboplatin 01/20/2015, no rash, resolved with a steroid dose pack 8. Thrombocytopenia second to chemotherapy, the carboplatin was dose reduced with cycle 2 adjuvant Taxol/carboplatin 02/10/2015 9. Chemotherapy-induced peripheral neuropathy-painful peripheral neuropathy involving the feet 01/19/2016.  10. Admission with acute cholecystitis 08/07/2015, status post a cholecystectomy 08/09/2015 11. Admission 10/08/2015 with abdominal pain/constipation, improved with bowel rest and laxatives 12. Mild right hydronephrosis noted on the CT 10/07/2015. Renal ultrasound 12/16/2015 with no hydronephrosis noted.No hydronephrosis on CT 07/19/2016.   Disposition: Lynn Ramirez appears unchanged. She is having increased dyspnea and right-sided abdominal pain. We are referring her for a chest x-ray to evaluate for a pleural effusion and abdominal x-ray to evaluate for possible early bowel obstruction.  The CA 125 from 08/16/2016 was higher. Dr. Benay Spice reviewed treatment options. She will return for a follow-up CA-125 in 3 weeks with a follow-up visit a few days later. The plan is to begin Taxol/Avastin if the CA-125 is higher.  The etiology of the leg pain is unclear. She will try resuming Neurontin at bedtime.  She will return for a follow-up visit on 09/27/2016. She will contact the office in the interim with any problems. We specifically discussed nausea/vomiting, constipation, worsening pain and/or dyspnea.  Patient seen with Dr. Benay Spice. 25 minutes were spent  face-to-face at today's visit with the majority of that time involved in counseling/coordination of care.      Ned Card ANP/GNP-BC   09/04/2016  9:50 AM This was a shared visit with Ned Card. Lynn Ramirez has a history of advanced stage ovarian cancer. The CA-125 is higher while off of treatment. It is unclear whether her current symptoms are related to the ovarian cancer. We discussed treatment options. The plan is to initiate treatment with Taxol/Avastin if the CA-125 continues to rise.  Julieanne Manson, M.D.

## 2016-09-05 ENCOUNTER — Telehealth: Payer: Self-pay

## 2016-09-05 NOTE — Telephone Encounter (Signed)
Called and informed pt of xray results. Pt verbalized understanding and denies any questions or concerns at this time.

## 2016-09-05 NOTE — Telephone Encounter (Signed)
-----   Message from [name], NP sent at 09/05/2016  9:49 AM EST ----- Please let her know the chest x-ray was negative for pleural effusion and abdominal x-ray negative for signs of obstruction.

## 2016-09-21 ENCOUNTER — Encounter: Payer: Self-pay | Admitting: Cardiology

## 2016-09-25 ENCOUNTER — Other Ambulatory Visit (HOSPITAL_BASED_OUTPATIENT_CLINIC_OR_DEPARTMENT_OTHER): Payer: Medicare Other

## 2016-09-25 ENCOUNTER — Ambulatory Visit (HOSPITAL_BASED_OUTPATIENT_CLINIC_OR_DEPARTMENT_OTHER): Payer: Medicare Other

## 2016-09-25 DIAGNOSIS — C5701 Malignant neoplasm of right fallopian tube: Secondary | ICD-10-CM | POA: Diagnosis not present

## 2016-09-25 DIAGNOSIS — Z95828 Presence of other vascular implants and grafts: Secondary | ICD-10-CM

## 2016-09-25 DIAGNOSIS — C569 Malignant neoplasm of unspecified ovary: Secondary | ICD-10-CM

## 2016-09-25 DIAGNOSIS — Z452 Encounter for adjustment and management of vascular access device: Secondary | ICD-10-CM | POA: Diagnosis not present

## 2016-09-25 LAB — COMPREHENSIVE METABOLIC PANEL
ALBUMIN: 3.5 g/dL (ref 3.5–5.0)
ALT: 17 U/L (ref 0–55)
AST: 14 U/L (ref 5–34)
Alkaline Phosphatase: 90 U/L (ref 40–150)
Anion Gap: 9 mEq/L (ref 3–11)
BILIRUBIN TOTAL: 0.33 mg/dL (ref 0.20–1.20)
BUN: 22.9 mg/dL (ref 7.0–26.0)
CO2: 31 meq/L — AB (ref 22–29)
CREATININE: 1 mg/dL (ref 0.6–1.1)
Calcium: 9.8 mg/dL (ref 8.4–10.4)
Chloride: 101 mEq/L (ref 98–109)
EGFR: 54 mL/min/{1.73_m2} — ABNORMAL LOW (ref >90)
GLUCOSE: 128 mg/dL (ref 70–140)
Potassium: 3.9 mEq/L (ref 3.5–5.1)
SODIUM: 141 meq/L (ref 136–145)
TOTAL PROTEIN: 6.9 g/dL (ref 6.4–8.3)

## 2016-09-25 LAB — CBC WITH DIFFERENTIAL/PLATELET
BASO%: 0.2 % (ref 0.0–2.0)
Basophils Absolute: 0 10*3/uL (ref 0.0–0.1)
EOS%: 2.1 % (ref 0.0–7.0)
Eosinophils Absolute: 0.1 10*3/uL (ref 0.0–0.5)
HCT: 39 % (ref 34.8–46.6)
HEMOGLOBIN: 12.3 g/dL (ref 11.6–15.9)
LYMPH%: 20.3 % (ref 14.0–49.7)
MCH: 29.9 pg (ref 25.1–34.0)
MCHC: 31.5 g/dL (ref 31.5–36.0)
MCV: 94.7 fL (ref 79.5–101.0)
MONO#: 0.4 10*3/uL (ref 0.1–0.9)
MONO%: 6.6 % (ref 0.0–14.0)
NEUT%: 70.8 % (ref 38.4–76.8)
NEUTROS ABS: 3.7 10*3/uL (ref 1.5–6.5)
Platelets: 259 10*3/uL (ref 145–400)
RBC: 4.12 10*6/uL (ref 3.70–5.45)
RDW: 15.8 % — AB (ref 11.2–14.5)
WBC: 5.3 10*3/uL (ref 3.9–10.3)
lymph#: 1.1 10*3/uL (ref 0.9–3.3)

## 2016-09-25 MED ORDER — HEPARIN SOD (PORK) LOCK FLUSH 100 UNIT/ML IV SOLN
500.0000 [IU] | Freq: Once | INTRAVENOUS | Status: AC | PRN
  Administered 2016-09-25: 500 [IU] via INTRAVENOUS
  Filled 2016-09-25: qty 5

## 2016-09-25 MED ORDER — SODIUM CHLORIDE 0.9 % IJ SOLN
10.0000 mL | INTRAMUSCULAR | Status: DC | PRN
  Administered 2016-09-25: 10 mL via INTRAVENOUS
  Filled 2016-09-25: qty 10

## 2016-09-26 ENCOUNTER — Other Ambulatory Visit: Payer: Self-pay | Admitting: Oncology

## 2016-09-26 DIAGNOSIS — Z7189 Other specified counseling: Secondary | ICD-10-CM

## 2016-09-26 LAB — CA 125: CANCER ANTIGEN (CA) 125: 354.8 U/mL — AB (ref 0.0–38.1)

## 2016-09-27 ENCOUNTER — Ambulatory Visit (HOSPITAL_BASED_OUTPATIENT_CLINIC_OR_DEPARTMENT_OTHER): Payer: Medicare Other | Admitting: Oncology

## 2016-09-27 ENCOUNTER — Ambulatory Visit (HOSPITAL_BASED_OUTPATIENT_CLINIC_OR_DEPARTMENT_OTHER): Payer: Medicare Other

## 2016-09-27 ENCOUNTER — Telehealth: Payer: Self-pay | Admitting: Oncology

## 2016-09-27 VITALS — BP 144/76

## 2016-09-27 VITALS — BP 157/83 | HR 84 | Temp 98.0°F | Resp 22 | Wt 190.8 lb

## 2016-09-27 DIAGNOSIS — Z5112 Encounter for antineoplastic immunotherapy: Secondary | ICD-10-CM

## 2016-09-27 DIAGNOSIS — C482 Malignant neoplasm of peritoneum, unspecified: Secondary | ICD-10-CM

## 2016-09-27 DIAGNOSIS — C569 Malignant neoplasm of unspecified ovary: Secondary | ICD-10-CM

## 2016-09-27 DIAGNOSIS — C5701 Malignant neoplasm of right fallopian tube: Secondary | ICD-10-CM

## 2016-09-27 DIAGNOSIS — Z5111 Encounter for antineoplastic chemotherapy: Secondary | ICD-10-CM

## 2016-09-27 LAB — UA PROTEIN, DIPSTICK - CHCC: Protein, ur: <30 mg/dL

## 2016-09-27 MED ORDER — PACLITAXEL CHEMO INJECTION 300 MG/50ML
80.0000 mg/m2 | Freq: Once | INTRAVENOUS | Status: AC
  Administered 2016-09-27: 150 mg via INTRAVENOUS
  Filled 2016-09-27: qty 25

## 2016-09-27 MED ORDER — SODIUM CHLORIDE 0.9 % IV SOLN
10.0000 mg/kg | Freq: Once | INTRAVENOUS | Status: AC
  Administered 2016-09-27: 850 mg via INTRAVENOUS
  Filled 2016-09-27: qty 32

## 2016-09-27 MED ORDER — SODIUM CHLORIDE 0.9 % IV SOLN
Freq: Once | INTRAVENOUS | Status: AC
  Administered 2016-09-27: 13:00:00 via INTRAVENOUS

## 2016-09-27 MED ORDER — HEPARIN SOD (PORK) LOCK FLUSH 100 UNIT/ML IV SOLN
500.0000 [IU] | Freq: Once | INTRAVENOUS | Status: AC | PRN
  Administered 2016-09-27: 500 [IU]
  Filled 2016-09-27: qty 5

## 2016-09-27 MED ORDER — FAMOTIDINE IN NACL 20-0.9 MG/50ML-% IV SOLN
20.0000 mg | Freq: Once | INTRAVENOUS | Status: AC
  Administered 2016-09-27: 20 mg via INTRAVENOUS

## 2016-09-27 MED ORDER — SODIUM CHLORIDE 0.9 % IV SOLN: Freq: Once | INTRAVENOUS | Status: DC

## 2016-09-27 MED ORDER — DEXAMETHASONE SODIUM PHOSPHATE 10 MG/ML IJ SOLN
INTRAMUSCULAR | Status: AC
  Filled 2016-09-27: qty 1

## 2016-09-27 MED ORDER — SODIUM CHLORIDE 0.9% FLUSH
10.0000 mL | INTRAVENOUS | Status: DC | PRN
  Administered 2016-09-27: 10 mL
  Filled 2016-09-27: qty 10

## 2016-09-27 MED ORDER — DIPHENHYDRAMINE HCL 50 MG/ML IJ SOLN
INTRAMUSCULAR | Status: AC
  Filled 2016-09-27: qty 1

## 2016-09-27 MED ORDER — DIPHENHYDRAMINE HCL 50 MG/ML IJ SOLN
25.0000 mg | Freq: Once | INTRAMUSCULAR | Status: AC
  Administered 2016-09-27: 25 mg via INTRAVENOUS

## 2016-09-27 MED ORDER — DEXAMETHASONE SODIUM PHOSPHATE 10 MG/ML IJ SOLN
5.0000 mg | Freq: Once | INTRAMUSCULAR | Status: AC
  Administered 2016-09-27: 5 mg via INTRAVENOUS

## 2016-09-27 MED ORDER — FAMOTIDINE IN NACL 20-0.9 MG/50ML-% IV SOLN
INTRAVENOUS | Status: AC
  Filled 2016-09-27: qty 50

## 2016-09-27 NOTE — Progress Notes (Signed)
[place] OFFICE PROGRESS NOTE   Diagnosis: Ovarian cancer  INTERVAL HISTORY:   She reports improvement in leg pain with gabapentin. She has increased dyspnea. She plans to see Dr. Lamonte Sakai next week. She has soreness in the right lower abdomen.  Objective:  Vital signs in last 24 hours:  Blood pressure (!) 157/83, pulse 84, temperature 98 F (36.7 C), temperature source Oral, resp. rate (!) 22, weight 190 lb 12.8 oz (86.5 kg), SpO2 94 %.    HEENT: No thrush Resp: Distant breath sounds, no respiratory distress Cardio: Regular rate and rhythm GI: No hepatosplenomegaly, no apparent ascites, no mass, mild tenderness in the lower abdomen Vascular: No leg edema  Portacath/PICC-without erythema  Lab Results:  Lab Results  Component Value Date   WBC 5.3 09/25/2016   HGB 12.3 09/25/2016   HCT 39.0 09/25/2016   MCV 94.7 09/25/2016   PLT 259 09/25/2016   NEUTROABS 3.7 09/25/2016     Medications: I have reviewed the patient's current medications.  Assessment/Plan: 1. 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 Significant improvement in malignant right pleural effusion. Status post robotic-assisted laparoscopic hysterectomy with bilateral salpingoophorectomy, omentectomy, radical tumor debulking 12/29/2014. Per Dr. Serita Grit office note 01/14/2015 cytoreduction was optimal with residual disease remaining only in the 1 mm implants on the small intestine. 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 Cycle 1 adjuvant Taxol/carboplatin 01/20/2015 Cycle 2 adjuvant Taxol/carboplatin 02/10/2015 Cycle 3 adjuvant Taxol/carboplatin 03/10/2015 CA 125 on 1013 2016-42 CTs of the chest, abdomen, and pelvis 05/31/2015 with no evidence of progressive ovarian cancer CTs of the chest, abdomen, and pelvis 08/07/2015 and 08/08/2015-no evidence of progressive ovarian cancer Peritoneal studding noted at the time of the cholecystectomy procedure 08/09/2015 Elevated CA 125 10/07/2015 CT 10/07/2015 with stricturing at the sigmoid colon, constipation, and omental nodularity Initiation of salvage weekly Taxol chemotherapy 10/13/2015 Taxol changed to every 2 weeks beginning 01/19/2016 due to painful neuropathy. CT scans 07/19/2016 with no acute findings. No features in the abdomen or pelvis to suggest recurrent disease.Stable mild fullness right intrarenal collecting system. Elevated CA 125 treatment resumed with Taxol/Avastin on a 2 week schedule 09/27/2016 2. COPD 3. Dyspnea secondary to COPD and the large right pleural effusion, status post therapeutic thoracentesis procedures 09/30/2014,10/09/2014, and 10/21/2014. Left thoracentesis 10/30/2014 4. Left nephrectomy as a child 5. Delayed nausea following cycle 1 Taxol/carboplatin,  Aloxi/Emend added with cycle 2 with improvement. 6. Right lower extremity edema, right calf pain 12/09/2014. Negative venous Doppler 12/09/2014. 7. Diffuse pruritus following cycle 1 adjuvant Taxol/carboplatin 01/20/2015, no rash, resolved with a steroid dose pack 8. Thrombocytopenia second to chemotherapy, the carboplatin was dose reduced with cycle 2 adjuvant Taxol/carboplatin 02/10/2015 9. Chemotherapy-induced peripheral neuropathy-painful peripheral neuropathy involving the feet 01/19/2016.  10. Admission  with acute cholecystitis 08/07/2015, status post a cholecystectomy 08/09/2015 11. Admission 10/08/2015 with abdominal pain/constipation, improved with bowel rest and laxatives 12. Mild right hydronephrosis noted on the CT 10/07/2015. Renal ultrasound 12/16/2015 with no hydronephrosis noted.No hydronephrosis on CT 07/19/2016.    Disposition:  She appears unchanged. The CA 125 is higher. The plan is to begin Taxol/Avastin today. We reviewed the potential toxicities associated with Avastin and she agrees to proceed.  25 minutes were spent with the patient today. The majority of the time was used for counseling and coordination of care.  Betsy Coder, MD  09/27/2016  11:33 AM

## 2016-09-27 NOTE — Telephone Encounter (Signed)
Gave patient spouse avs report and appointments for April.   °

## 2016-09-27 NOTE — Patient Instructions (Signed)
[facility] Discharge Instructions for Patients Receiving Chemotherapy  Today you received the following chemotherapy agents:  Taxol and Avastin.  To help prevent nausea and vomiting after your treatment, we encourage you to take your nausea medication as prescribed.   If you develop nausea and vomiting that is not controlled by your nausea medication, call the clinic.   BELOW ARE SYMPTOMS THAT SHOULD BE REPORTED IMMEDIATELY:  *FEVER GREATER THAN 100.5 F  *CHILLS WITH OR WITHOUT FEVER  NAUSEA AND VOMITING THAT IS NOT CONTROLLED WITH YOUR NAUSEA MEDICATION  *UNUSUAL SHORTNESS OF BREATH  *UNUSUAL BRUISING OR BLEEDING  TENDERNESS IN MOUTH AND THROAT WITH OR WITHOUT PRESENCE OF ULCERS  *URINARY PROBLEMS  *BOWEL PROBLEMS  UNUSUAL RASH Items with * indicate a potential emergency and should be followed up as soon as possible.  Feel free to call the clinic you have any questions or concerns. The clinic phone number is [phone].  Please show the [place] at check-in to the Emergency Department and triage nurse.  (Avastin) Bevacizumab What is this medicine? BEVACIZUMAB (be va SIZ yoo mab) is a monoclonal antibody. It is used to treat many types of cancer. This medicine may be used for other purposes; ask your health care provider or pharmacist if you have questions. COMMON BRAND NAME(S): Avastin What should I tell my health care provider before I take this medicine? They need to know if you have any of these conditions: -diabetes -heart disease -high blood pressure -history of coughing up blood -prior anthracycline chemotherapy (e.g., doxorubicin, daunorubicin, epirubicin) -recent or ongoing radiation therapy -recent or planning to have surgery -stroke -an unusual or allergic reaction to bevacizumab, hamster proteins, mouse proteins, other medicines, foods, dyes, or preservatives -pregnant or trying to get pregnant -breast-feeding How should I use  this medicine? This medicine is for infusion into a vein. It is given by a health care professional in a hospital or clinic setting. Talk to your pediatrician regarding the use of this medicine in children. Special care may be needed. Overdosage: If you think you have taken too much of this medicine contact a poison control center or emergency room at once. NOTE: This medicine is only for you. Do not share this medicine with others. What if I miss a dose? It is important not to miss your dose. Call your doctor or health care professional if you are unable to keep an appointment. What may interact with this medicine? Interactions are not expected. This list may not describe all possible interactions. Give your health care provider a list of all the medicines, herbs, non-prescription drugs, or dietary supplements you use. Also tell them if you smoke, drink alcohol, or use illegal drugs. Some items may interact with your medicine. What should I watch for while using this medicine? Your condition will be monitored carefully while you are receiving this medicine. You will need important blood work and urine testing done while you are taking this medicine. This medicine may increase your risk to bruise or bleed. Call your doctor or health care professional if you notice any unusual bleeding. This medicine should be started at least 28 days following major surgery and the site of the surgery should be totally healed. Check with your doctor before scheduling dental work or surgery while you are receiving this treatment. Talk to your doctor if you have recently had surgery or if you have a wound that has not healed. Do not become pregnant while taking this medicine or for  6 months after stopping it. Women should inform their doctor if they wish to become pregnant or think they might be pregnant. There is a potential for serious side effects to an unborn child. Talk to your health care professional or pharmacist  for more information. Do not breast-feed an infant while taking this medicine and for 6 months after the last dose. This medicine has caused ovarian failure in some women. This medicine may interfere with the ability to have a child. You should talk to your doctor or health care professional if you are concerned about your fertility. What side effects may I notice from receiving this medicine? Side effects that you should report to your doctor or health care professional as soon as possible: -allergic reactions like skin rash, itching or hives, swelling of the face, lips, or tongue -chest pain or chest tightness -chills -coughing up blood -high fever -seizures -severe constipation -signs and symptoms of bleeding such as bloody or black, tarry stools; red or dark-brown urine; spitting up blood or brown material that looks like coffee grounds; red spots on the skin; unusual bruising or bleeding from the eye, gums, or nose -signs and symptoms of a blood clot such as breathing problems; chest pain; severe, sudden headache; pain, swelling, warmth in the leg -signs and symptoms of a stroke like changes in vision; confusion; trouble speaking or understanding; severe headaches; sudden numbness or weakness of the face, arm or leg; trouble walking; dizziness; loss of balance or coordination -stomach pain -sweating -swelling of legs or ankles -vomiting -weight gain Side effects that usually do not require medical attention (report to your doctor or health care professional if they continue or are bothersome): -back pain -changes in taste -decreased appetite -dry skin -nausea -tiredness This list may not describe all possible side effects. Call your doctor for medical advice about side effects. You may report side effects to FDA at 1-800-FDA-1088. Where should I keep my medicine? This drug is given in a hospital or clinic and will not be stored at home. NOTE: This sheet is a summary. It may not  cover all possible information. If you have questions about this medicine, talk to your doctor, pharmacist, or health care provider.  2018 [employer]vier/Gold Standard (2016-06-23 14:33:29)

## 2016-10-02 ENCOUNTER — Ambulatory Visit (INDEPENDENT_AMBULATORY_CARE_PROVIDER_SITE_OTHER): Payer: Medicare Other | Admitting: Cardiology

## 2016-10-02 ENCOUNTER — Other Ambulatory Visit: Payer: Self-pay | Admitting: Oncology

## 2016-10-02 ENCOUNTER — Encounter: Payer: Self-pay | Admitting: Cardiology

## 2016-10-02 VITALS — BP 132/64 | HR 73 | Ht 61.0 in | Wt 187.0 lb

## 2016-10-02 DIAGNOSIS — C799 Secondary malignant neoplasm of unspecified site: Secondary | ICD-10-CM

## 2016-10-02 DIAGNOSIS — E782 Mixed hyperlipidemia: Secondary | ICD-10-CM | POA: Diagnosis not present

## 2016-10-02 DIAGNOSIS — M79604 Pain in right leg: Secondary | ICD-10-CM

## 2016-10-02 DIAGNOSIS — I2583 Coronary atherosclerosis due to lipid rich plaque: Secondary | ICD-10-CM | POA: Diagnosis not present

## 2016-10-02 DIAGNOSIS — I10 Essential (primary) hypertension: Secondary | ICD-10-CM | POA: Diagnosis not present

## 2016-10-02 DIAGNOSIS — M7989 Other specified soft tissue disorders: Secondary | ICD-10-CM | POA: Diagnosis not present

## 2016-10-02 DIAGNOSIS — I2510 Atherosclerotic heart disease of native coronary artery without angina pectoris: Secondary | ICD-10-CM

## 2016-10-02 DIAGNOSIS — C561 Malignant neoplasm of right ovary: Secondary | ICD-10-CM

## 2016-10-02 DIAGNOSIS — E784 Other hyperlipidemia: Secondary | ICD-10-CM | POA: Diagnosis not present

## 2016-10-02 DIAGNOSIS — E7849 Other hyperlipidemia: Secondary | ICD-10-CM

## 2016-10-02 MED ORDER — CARVEDILOL 3.125 MG PO TABS: 3.1250 mg | ORAL_TABLET | Freq: Two times a day (BID) | ORAL | 1 refills | Status: DC

## 2016-10-02 MED ORDER — ONDANSETRON 4 MG PO TBDP: 4.0000 mg | ORAL_TABLET | Freq: Three times a day (TID) | ORAL | 1 refills | Status: DC | PRN

## 2016-10-02 NOTE — Patient Instructions (Addendum)
Medication Instructions:   START TAKING CARVEDILOL 3.125 MG TWICE DAILY     Labwork:  PRIOR TOO YOUR 3 MONTH FOLLOW-UP APPOINTMENT WITH DR NELSON TO CHECK ---CMET, CBC W DIFF, TSH, AND LIPIDS---PLEASE COME FASTING TO THIS LAB APPOINTMENT---PT WILL GET HER CMET, CBC W DIFF, AND TSH DONE AT [facility]    Testing/Procedures:  Your physician has requested that you have an echocardiogram. Echocardiography is a painless test that uses sound waves to create images of your heart. It provides your doctor with information about the size and shape of your heart and how well your heart's chambers and valves are working. This procedure takes approximately one hour. There are no restrictions for this procedure.  DO THIS ECHO WITH STRAIN PER DR NELSON   Your physician has requested that you have a lower  venous duplex. This test is an ultrasound of the veins in the legs or arms. It looks at venous blood flow that carries blood from the heart to the legs or arms. Allow one hour for a Lower Venous exam. Allow thirty minutes for an Upper Venous exam. There are no restrictions or special instructions.     Follow-Up:  3 MONTHS WITH DR NELSON---PLEASE HAVE YOUR LABS DONE A WEEK PRIOR TOO       If you need a refill on your cardiac medications before your next appointment, please call your pharmacy.

## 2016-10-02 NOTE — Progress Notes (Signed)
Patient ID: Lynn Ramirez, female   DOB: Jul 02, 1941, 76 y.o.   MRN: [id number]      Cardiology Office Note  Date:  10/02/2016   ID:  Lynn Ramirez, DOB [birth year], MRN [id number]  PCP:  Annye Asa, MD  Cardiologist:  Ena Dawley, MD   Chief complaint: Follow-up for coronary artery disease and hyperlipidemia.  History of Present Illness: Lynn Ramirez is a 76 y.o. female who presents for evaluation of DOE and abnormal chest CT. The patient is being treated for ovarian cancer with metastasis, currently in remission. She has h/o COPD, hyperlipidemia and hypertension.  She underwent chest CT for evaluation of metastasis and as found to have coronary calcifications.  She denies LE edema, orthopnea, PND, syncope.  She complains of RLE pain mostly on exertion but also at rest.  She has FH of premature CAD.  10/01/2015 - the patient is coming after 6 months, in the meantime she underwent Lexiscan nuclear stress test that was negative for prior infarct or ischemia. She was started atorvastatin 10 mg daily for LDL of 1:30 as well as evidence of calcifications on the coronary arteries on the chest CT. About 2 months ago she underwent cholecystectomy with significant elevation of her LFTs and her atorvastatin was discontinued. Recheck LFTs on femora 22,017 were all back to normal. Today patient denies any chest pain shortness of breath no palpitations or syncope no lower extremity edema no further claudications.  10/02/2016 - this is one year follow-up, the patient states that her overeating cancer that she was diagnosed with 2 years ago has been worsening with increasing levels of CA-125 - the most recent 350. She was started on chemotherapy Avastin that she takes every other week. His chemotherapy is known for causing hypertension and to 40% of patients, also DVT and in very low frequency potential myocardial infarction. The patient states that she has noticed elevated blood pressure  after her chemotherapy in 140-150 range. She denies any chest pain, no palpitations dizziness or syncope. Yesterday she has noticed swelling and pain in her right lower extremity calf. No shortness of breath.  Past Medical History:  Diagnosis Date  . Cancer ([place])    ovarian  . COPD (chronic obstructive pulmonary disease) ([place])   . Difficulty sleeping   . Eczema    hands  . Emphysema   . GERD (gastroesophageal reflux disease)   . H/O hydronephrosis   . History of transfusion    age 27  . Hypertension     Past Surgical History:  Procedure Laterality Date  . ABDOMINAL HYSTERECTOMY    . CHOLECYSTECTOMY N/A 08/09/2015   Procedure: Attempted LAPAROSCOPIC coverted  open CHOLECYSTECTOMY WITH INTRAOPERATIVE CHOLANGIOGRAM;  Surgeon: Autumn Messing III, MD;  Location: [facility];  Service: General;  Laterality: N/A;  . HAND SURGERY Right 1980's  . LAPAROTOMY N/A 12/29/2014   Procedure:  LAPAROTOMY;  Surgeon: Everitt Amber, MD;  Location: [facility];  Service: Gynecology;  Laterality: N/A;  . NEPHRECTOMY    . ROBOTIC ASSISTED TOTAL HYSTERECTOMY WITH BILATERAL SALPINGO OOPHERECTOMY Bilateral 12/29/2014   Procedure: ROBOTIC ASSISTED TOTAL LAPAROSCOPIC HYSTERECTOMY WITH BILATERAL SALPINGO OOPHORECTOMY AND OOMENTECTOMY WITH RADICAL TUMOR [place] ;  Surgeon: Everitt Amber, MD;  Location: [facility];  Service: Gynecology;  Laterality: Bilateral;  . THORACENTESIS     several  . TONSILLECTOMY    . TUBAL LIGATION       Current Outpatient Prescriptions  Medication Sig Dispense Refill  . Acetaminophen (TYLENOL) 325 MG CAPS Take 1 tablet by mouth  3 (three) times daily as needed (pain).     Marland Kitchen antiseptic oral rinse (BIOTENE) LIQD 15 mLs by Mouth Rinse route 3 (three) times daily.    Marland Kitchen atorvastatin (LIPITOR) 10 MG tablet TAKE 1 TABLET(10 MG) BY MOUTH DAILY 90 tablet 0  . Biotin 2500 MCG CAPS Take 1 tablet by mouth daily.    . budesonide-formoterol (SYMBICORT) 160-4.5 MCG/ACT inhaler Inhale 2 puffs into the lungs 2 (two)  times daily. 2 Inhaler 0  . cholecalciferol (VITAMIN D) 1000 units tablet Take 1,000 Units by mouth daily.    . diphenoxylate-atropine (LOMOTIL) 2.5-0.025 MG tablet Take 2 tablets by mouth 4 (four) times daily as needed for diarrhea or loose stools. 30 tablet 0  . docusate sodium (COLACE) 100 MG capsule Take 1 capsule (100 mg total) by mouth 2 (two) times daily. 60 capsule 0  . famotidine (PEPCID) 10 MG tablet Take 20 mg by mouth daily.     . fluocinonide cream (LIDEX) 0.62 % Apply 1 application topically 2 (two) times daily as needed (For ezcema).     . gabapentin (NEURONTIN) 300 MG capsule Take 300 mg by mouth at bedtime.    . lidocaine-prilocaine (EMLA) cream Apply to port site one hour prior to use. Do not rub in. Cover with plastic. 30 g 0  . loperamide (IMODIUM) 2 MG capsule Take by mouth as needed for diarrhea or loose stools.    . Omega-3 Fatty Acids (OMEGA-3 PO) Take 1 capsule by mouth daily.    . ondansetron (ZOFRAN-ODT) 4 MG disintegrating tablet DISSOLVE 1 TABLET BY MOUTH DAILY AS NEEDED FOR NAUSEA OR VOMITING 20 tablet 0  . oxyCODONE-acetaminophen (PERCOCET/ROXICET) 5-325 MG tablet Take 1 tablet by mouth every 6 (six) hours as needed for severe pain. 30 tablet 0  . polyethylene glycol (MIRALAX / GLYCOLAX) packet Take 17 g by mouth daily. 14 each 0  . PROAIR HFA 108 (90 Base) MCG/ACT inhaler INHALE 2 PUFFS INTO THE LUNGS FOUR TIMES DAILY AS NEEDED FOR WHEEZING 8.5 g 5  . senna (SENOKOT) 8.6 MG TABS tablet Take 2 tablets (17.2 mg total) by mouth daily. 60 each 0  . traMADol (ULTRAM) 50 MG tablet Take 1 tablet (50 mg total) by mouth every 12 (twelve) hours as needed. for pain 40 tablet 0  . triamterene-hydrochlorothiazide (MAXZIDE-25) 37.5-25 MG tablet Take 0.5 tablets by mouth daily.     . vitamin B-12 (CYANOCOBALAMIN) 100 MCG tablet Take 100 mcg by mouth daily. Reported on 01/19/2016    . zolpidem (AMBIEN) 5 MG tablet Take 1 tablet (5 mg total) by mouth at bedtime as needed for sleep. 30  tablet 0  . carvedilol (COREG) 3.125 MG tablet Take 1 tablet (3.125 mg total) by mouth 2 (two) times daily with a meal. 180 tablet 1   No current facility-administered medications for this visit.     Allergies:   Latex and Penicillins    Social History:  The patient  reports that she quit smoking about 3 years ago. Her smoking use included Cigarettes. She has a 40.00 pack-year smoking history. She has never used smokeless tobacco. She reports that she drinks alcohol. She reports that she does not use drugs.   Family History:  The patient's family history includes Breast cancer in her cousin; Cancer in her cousin and father; Diabetes in her father, paternal grandmother, and paternal uncle; Heart Problems in her maternal grandfather, maternal grandmother, and paternal uncle; Heart disease in her maternal aunt; Leukemia in her cousin; Lung cancer (  age of onset: 38) in her father; Pancreatic cancer (age of onset: 64) in her mother; Stroke in her paternal grandfather and paternal grandmother.    ROS:  Please see the history of present illness.   Otherwise, review of systems are positive for none.   All other systems are reviewed and negative.    PHYSICAL EXAM: VS:  BP 132/64   Pulse 73   Ht 5\' 1"  (1.549 m)   Wt 187 lb (84.8 kg)   BMI 35.33 kg/m  , BMI Body mass index is 35.33 kg/m. GEN: Well nourished, well developed, in no acute distress  HEENT: normal  Neck: no JVD, carotid bruits, or masses Cardiac: RRR; no murmurs, rubs, or gallops mild right calf tenderness and swelling.  Respiratory:  clear to auscultation bilaterally, normal work of breathing GI: soft, nontender, nondistended, + BS MS: no deformity or atrophy  Skin: warm and dry, no rash Neuro:  Strength and sensation are intact Psych: euthymic mood, full affect  EKG:  EKG is ordered today. The ekg ordered today demonstrates SR, normal ECG  Recent Labs: 03/15/2016: TSH 2.46 08/07/2016: Magnesium 1.9 09/25/2016: ALT 17; BUN  22.9; Creatinine 1.0; HGB 12.3; Platelets 259; Potassium 3.9; Sodium 141   Lipid Panel    Component Value Date/Time   CHOL 151 03/15/2016   TRIG 174 (A) 03/15/2016   HDL 33 (A) 03/15/2016   CHOLHDL 3.6 01/03/2016 0831   VLDL 34 (H) 01/03/2016 0831   LDLCALC 83 03/15/2016    Wt Readings from Last 3 Encounters:  10/02/16 187 lb (84.8 kg)  09/27/16 190 lb 12.8 oz (86.5 kg)  09/04/16 188 lb 3.2 oz (85.4 kg)    Lexiscan nuclear stress test: 04/2015  Nuclear stress EF: 63%.  There was no ST segment deviation noted during stress.  Defect 1: There is a defect of mild severity present in the apex location. Breast attenuation is present. Fixed defect, no ischemia.  EKG performed today 10/02/2016 was personally reviewed and it shows sinus rhythm with PACs otherwise normal EKG   ASSESSMENT AND PLAN:  1.  CAD, Abnormal chest CT with coronary calcifications and DOE - normal Lexiscan nuclear stress test in 04/2015 (unable to walk on a treadmill) to assess for significant ischemia. We will continue atorvastatin and add carvedilol 3.125 mg by mouth twice a day.  2. Hypertension - worsening with Avastin, considering she is also on chemotherapy will start carvedilol 3.125 mg by mouth twice a day. Continue Maxide 25.  3. Chemotherapy with Avastin - we will obtain baseline echocardiogram with strain and repeat in 3 months.  4. Right calf edema and tenderness - we will check venous duplex to evaluate for potential DVT as Avastin is known for causing this in addition to increased risk with cancer itself.  5. HLP - check lipids prior to her next visit, for now continue atorvastatin 10 mg daily and fish oil.   Follow up in 1 year  Signed, Ena Dawley, MD  10/02/2016 1:24 PM    [facility] [address], [place], [place]  [address] Phone: [phone]; Fax: [phone]

## 2016-10-05 ENCOUNTER — Ambulatory Visit (INDEPENDENT_AMBULATORY_CARE_PROVIDER_SITE_OTHER): Payer: Medicare Other | Admitting: Emergency Medicine

## 2016-10-05 ENCOUNTER — Encounter: Payer: Self-pay | Admitting: Emergency Medicine

## 2016-10-05 VITALS — BP 122/72 | HR 91 | Ht 61.0 in | Wt 186.0 lb

## 2016-10-05 DIAGNOSIS — I2510 Atherosclerotic heart disease of native coronary artery without angina pectoris: Secondary | ICD-10-CM

## 2016-10-05 DIAGNOSIS — I2583 Coronary atherosclerosis due to lipid rich plaque: Secondary | ICD-10-CM | POA: Diagnosis not present

## 2016-10-05 DIAGNOSIS — J449 Chronic obstructive pulmonary disease, unspecified: Secondary | ICD-10-CM

## 2016-10-05 MED ORDER — TIOTROPIUM BROMIDE-OLODATEROL 2.5-2.5 MCG/ACT IN AERS: 2.0000 | INHALATION_SPRAY | Freq: Every day | RESPIRATORY_TRACT | 0 refills | Status: DC

## 2016-10-05 NOTE — Assessment & Plan Note (Signed)
COPD is undertreated, suspect that she is desaturating.  Will change symbicort to Stiolto to see if she benefits.  Walking oximetry today, start O2 if she meets criteria. Want her to get a POC if she does qualify.  Follow in 3 months

## 2016-10-05 NOTE — Progress Notes (Signed)
Patient seen in the office today and instructed on use of Stiolto.  Patient expressed understanding and demonstrated technique. Lynn Ramirez [facility] 10/05/16

## 2016-10-05 NOTE — Patient Instructions (Addendum)
We will stop Symbicort for now.  Start once a day Take albuterol 2 puffs up to every 4 hours if needed for shortness of breath.  We will perform a walking oximetry on room air today.  Follow with Dr Lamonte Sakai in 3 months or sooner if you have any problems.

## 2016-10-05 NOTE — Progress Notes (Signed)
Subjective:    Patient ID: Lynn Ramirez, female    DOB: [birth year], 76 y.o.   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 History is taken from the patient and from her husband. She completed chemo August, is following with Dr Benay Spice, Dr Denman George. She has been tired, is having some neuropathy. Her breathing is stable. She is on spiriva and symbicort. Uses SABA rarely. She is interested in doing a trial off of spiriva. I personally reviewed her pulmonary function testing from 05/06/2012. This showed severe obstructive lung disease without bronchodilator response.   ROV 05/06/15 -- follow-up visit for COPD and pleural effusions in the setting of serous carcinoma on chemotherapy. At her last visit we tried stopping Spiriva to see if she would tolerate this. She doesn't notice any difference in her breathing off this. Her chemo ended 03/10/15. She still has periods of severe fatigue that can influence her breathing. She remains on Symbicort. She wants the flu shot and Prevnar-13, has now been off chemo x 7 weeks. She underwent cardiac stress testing 10/11 that was reassuring. Also tested her LE perfusion > no abnormality.   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 She was recently started on Taxol as salvage chemotherapy. She is on Symbicort, was started back on Spiriva in mid December and believes that she has probably benefited. Symbicort seems to do the most for her. She does get fatigued w chemo, is associated with some dyspnea. She is able to go to the [facility] and exercise. She rarely uses albuterol. She does not cough often, she does have a hoarse voice recently due to allergies. She is on loratadine now.   ROV 04/04/16 -- this follow-up visit for history of COPD and bilateral pleural effusions, resolved. She  also has a history of ovarian/fallopian carcinoma being treated with chemotherapy. Had difficulty w neuropathy on taxol.   At her last visit in April we did a trial off of Spiriva to see if she would miss this. She continued Symbicort at that time. She was able to tolerate coming off the Spiriva, no significant clinical change. Not needing her SABA. She is planning to go to [facility] in October.   ROV 10/05/16 -- visit for patient with a history of COPD, hx pleural effusions as detailed above. Her current BD is Symbicort. Her last CXR was 09/04/16 that I have reviewed - no pleural fluid. She is having more dyspnea on exertion. She restarted chemotherapy in march for progression of her fallopian CA. She uses albuterol 1-3x a day. She is on RA. Has not noticed any desats at home.     Review of Systems  As per HPI     Objective:   Physical Exam Vitals:   10/05/16 1104  BP: 122/72  Pulse: 91  SpO2: 91%  Weight: 186 lb (84.4 kg)  Height: 5\' 1"  (1.549 m)   Gen: Pleasant, well-nourished, in no distress,  normal affect  ENT: No lesions,  mouth clear,  oropharynx clear, no postnasal drip  Neck: No JVD, no TMG, no carotid bruits  Lungs: No use of accessory muscles, small breaths, clear without rales or rhonchi  Cardiovascular: RRR, heart sounds normal, no murmur or gallops, no peripheral edema  Musculoskeletal: No deformities, no cyanosis or clubbing  Neuro: alert, non focal  Skin: Warm, no lesions or  rashes      Assessment & Plan:  COPD (chronic obstructive pulmonary disease) ([place]) COPD is undertreated, suspect that she is desaturating.  Will change symbicort to Stiolto to see if she benefits.  Walking oximetry today, start O2 if she meets criteria. Want her to get a POC if she does qualify.  Follow in 3 months   Baltazar Apo, MD, PhD 10/05/2016, 12:42 PM [facility] [phone] or if no answer [phone]

## 2016-10-08 ENCOUNTER — Other Ambulatory Visit: Payer: Self-pay | Admitting: Oncology

## 2016-10-12 ENCOUNTER — Other Ambulatory Visit (HOSPITAL_BASED_OUTPATIENT_CLINIC_OR_DEPARTMENT_OTHER): Payer: Medicare Other

## 2016-10-12 ENCOUNTER — Ambulatory Visit: Payer: Medicare Other

## 2016-10-12 ENCOUNTER — Ambulatory Visit (HOSPITAL_BASED_OUTPATIENT_CLINIC_OR_DEPARTMENT_OTHER): Payer: Medicare Other | Admitting: Oncology

## 2016-10-12 ENCOUNTER — Ambulatory Visit: Payer: Medicare Other | Admitting: Family Medicine

## 2016-10-12 ENCOUNTER — Ambulatory Visit (HOSPITAL_BASED_OUTPATIENT_CLINIC_OR_DEPARTMENT_OTHER): Payer: Medicare Other

## 2016-10-12 ENCOUNTER — Other Ambulatory Visit: Payer: Self-pay | Admitting: *Deleted

## 2016-10-12 VITALS — BP 123/53 | HR 70 | Temp 97.6°F | Resp 17 | Ht 61.0 in | Wt 189.8 lb

## 2016-10-12 VITALS — BP 118/46

## 2016-10-12 DIAGNOSIS — J910 Malignant pleural effusion: Secondary | ICD-10-CM

## 2016-10-12 DIAGNOSIS — C786 Secondary malignant neoplasm of retroperitoneum and peritoneum: Secondary | ICD-10-CM

## 2016-10-12 DIAGNOSIS — C569 Malignant neoplasm of unspecified ovary: Secondary | ICD-10-CM | POA: Diagnosis not present

## 2016-10-12 DIAGNOSIS — C5701 Malignant neoplasm of right fallopian tube: Secondary | ICD-10-CM

## 2016-10-12 DIAGNOSIS — Z5111 Encounter for antineoplastic chemotherapy: Secondary | ICD-10-CM | POA: Diagnosis not present

## 2016-10-12 DIAGNOSIS — Z5112 Encounter for antineoplastic immunotherapy: Secondary | ICD-10-CM | POA: Diagnosis not present

## 2016-10-12 DIAGNOSIS — C482 Malignant neoplasm of peritoneum, unspecified: Secondary | ICD-10-CM

## 2016-10-12 LAB — COMPREHENSIVE METABOLIC PANEL
ALBUMIN: 3.4 g/dL — AB (ref 3.5–5.0)
ALT: 15 U/L (ref 0–55)
AST: 14 U/L (ref 5–34)
Alkaline Phosphatase: 93 U/L (ref 40–150)
Anion Gap: 8 mEq/L (ref 3–11)
BUN: 21.7 mg/dL (ref 7.0–26.0)
CHLORIDE: 102 meq/L (ref 98–109)
CO2: 30 mEq/L — ABNORMAL HIGH (ref 22–29)
CREATININE: 1.1 mg/dL (ref 0.6–1.1)
Calcium: 9.8 mg/dL (ref 8.4–10.4)
EGFR: 51 mL/min/{1.73_m2} — ABNORMAL LOW (ref >90)
Glucose: 115 mg/dl (ref 70–140)
POTASSIUM: 4.2 meq/L (ref 3.5–5.1)
SODIUM: 140 meq/L (ref 136–145)
Total Bilirubin: 0.37 mg/dL (ref 0.20–1.20)
Total Protein: 6.8 g/dL (ref 6.4–8.3)

## 2016-10-12 LAB — CBC WITH DIFFERENTIAL/PLATELET
BASO%: 0.8 % (ref 0.0–2.0)
BASOS ABS: 0 10*3/uL (ref 0.0–0.1)
EOS%: 7.9 % — AB (ref 0.0–7.0)
Eosinophils Absolute: 0.3 10*3/uL (ref 0.0–0.5)
HCT: 39.3 % (ref 34.8–46.6)
HEMOGLOBIN: 12.8 g/dL (ref 11.6–15.9)
LYMPH%: 28.1 % (ref 14.0–49.7)
MCH: 29.7 pg (ref 25.1–34.0)
MCHC: 32.5 g/dL (ref 31.5–36.0)
MCV: 91.5 fL (ref 79.5–101.0)
MONO#: 0.4 10*3/uL (ref 0.1–0.9)
MONO%: 10.5 % (ref 0.0–14.0)
NEUT%: 52.7 % (ref 38.4–76.8)
NEUTROS ABS: 2 10*3/uL (ref 1.5–6.5)
Platelets: 199 10*3/uL (ref 145–400)
RBC: 4.29 10*6/uL (ref 3.70–5.45)
RDW: 15.9 % — AB (ref 11.2–14.5)
WBC: 3.8 10*3/uL — AB (ref 3.9–10.3)
lymph#: 1.1 10*3/uL (ref 0.9–3.3)

## 2016-10-12 MED ORDER — LIDOCAINE-PRILOCAINE 2.5-2.5 % EX CREA: TOPICAL_CREAM | CUTANEOUS | 0 refills | Status: AC

## 2016-10-12 MED ORDER — DEXAMETHASONE SODIUM PHOSPHATE 10 MG/ML IJ SOLN
5.0000 mg | Freq: Once | INTRAMUSCULAR | Status: AC
  Administered 2016-10-12: 5 mg via INTRAVENOUS

## 2016-10-12 MED ORDER — FAMOTIDINE IN NACL 20-0.9 MG/50ML-% IV SOLN
20.0000 mg | Freq: Once | INTRAVENOUS | Status: AC
  Administered 2016-10-12: 20 mg via INTRAVENOUS

## 2016-10-12 MED ORDER — DIPHENHYDRAMINE HCL 50 MG/ML IJ SOLN
INTRAMUSCULAR | Status: AC
  Filled 2016-10-12: qty 1

## 2016-10-12 MED ORDER — DIPHENHYDRAMINE HCL 50 MG/ML IJ SOLN
25.0000 mg | Freq: Once | INTRAMUSCULAR | Status: AC
  Administered 2016-10-12: 25 mg via INTRAVENOUS

## 2016-10-12 MED ORDER — SODIUM CHLORIDE 0.9% FLUSH
10.0000 mL | INTRAVENOUS | Status: DC | PRN
  Administered 2016-10-12: 10 mL
  Filled 2016-10-12: qty 10

## 2016-10-12 MED ORDER — HEPARIN SOD (PORK) LOCK FLUSH 100 UNIT/ML IV SOLN
500.0000 [IU] | Freq: Once | INTRAVENOUS | Status: AC | PRN
  Administered 2016-10-12: 500 [IU]
  Filled 2016-10-12: qty 5

## 2016-10-12 MED ORDER — DEXAMETHASONE SODIUM PHOSPHATE 10 MG/ML IJ SOLN
INTRAMUSCULAR | Status: AC
  Filled 2016-10-12: qty 1

## 2016-10-12 MED ORDER — PACLITAXEL CHEMO INJECTION 300 MG/50ML
80.0000 mg/m2 | Freq: Once | INTRAVENOUS | Status: AC
  Administered 2016-10-12: 150 mg via INTRAVENOUS
  Filled 2016-10-12: qty 25

## 2016-10-12 MED ORDER — SODIUM CHLORIDE 0.9 % IV SOLN
Freq: Once | INTRAVENOUS | Status: AC
  Administered 2016-10-12: 11:00:00 via INTRAVENOUS

## 2016-10-12 MED ORDER — SODIUM CHLORIDE 0.9 % IV SOLN
10.5000 mg/kg | Freq: Once | INTRAVENOUS | Status: AC
  Administered 2016-10-12: 900 mg via INTRAVENOUS
  Filled 2016-10-12: qty 32

## 2016-10-12 MED ORDER — FAMOTIDINE IN NACL 20-0.9 MG/50ML-% IV SOLN
INTRAVENOUS | Status: AC
  Filled 2016-10-12: qty 50

## 2016-10-12 NOTE — Progress Notes (Signed)
[facility] OFFICE PROGRESS NOTE   Diagnosis: Ovarian cancer  INTERVAL HISTORY:   Lynn Ramirez returns as scheduled. She resumed treatment with Taxol/Avastin on 09/27/2016. No change in neuropathy symptoms. She continues to have leg pain. She has been scheduled for Dopplers by cardiology. She is scheduled to see orthopedics next week. She reports intermittent episodes of low abdominal pain. She also has intermittent diarrhea. She had diarrhea earlier today. She has been able to go out to eat several times this week. She is now using a 2 L nasal cannula.   Objective:  Vital signs in last 24 hours:  Blood pressure (!) 123/53, pulse 70, temperature 97.6 F (36.4 C), temperature source Oral, resp. rate 17, height 5\' 1"  (1.549 m), weight 189 lb 12.8 oz (86.1 kg), SpO2 93 %.    HEENT: No thrush Resp: Distant breath sounds, no respiratory distress Cardio: Regular rate and rhythm GI: No apparent ascites, soft, mild tenderness in the mid lower abdomen, no mass Vascular: Trace edema at the lower leg bilaterally   Portacath/PICC-without erythema  Lab Results:  Lab Results  Component Value Date   WBC 3.8 (L) 10/12/2016   HGB 12.8 10/12/2016   HCT 39.3 10/12/2016   MCV 91.5 10/12/2016   PLT 199 10/12/2016   NEUTROABS 2.0 10/12/2016     Medications: I have reviewed the patient's current medications.  Assessment/Plan: 1. 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, immunohistochemical profile consistent with a GYN primary, elevated CA 125   Staging CTs of the chest, abdomen, and pelvis on 10/06/2014 revealed a loculated right pleural effusion, ascites, and omental/mesenteric thickening   Cytology from peritoneal fluid 10/13/2014 revealed malignant cells consistent with metastatic  adenocarcinoma Biopsy of an omental mass on 11/02/2014 revealed invasive serous carcinoma with psammoma bodies Cycle 1 Taxol/carboplatin 10/28/2014 Cycle 2 Taxol/carboplatin 11/18/2014 Cycle 3 Taxol/carboplatin 12/09/2014  CT scan 12/23/2014 with interval improvement in peritoneal carcinomatosis with near-complete resolution of ascites and decreased omental nodularity. Significant improvement in malignant right pleural effusion.  Status post robotic-assisted laparoscopic hysterectomy with bilateral salpingoophorectomy, omentectomy, radical tumor debulking 12/29/2014. Per Dr. Serita Grit office note 01/14/2015 cytoreduction was optimal with residual disease remaining only in the 1 mm implants on the small intestine. 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 Cycle 1 adjuvant Taxol/carboplatin 01/20/2015 Cycle 2 adjuvant Taxol/carboplatin 02/10/2015 Cycle 3 adjuvant Taxol/carboplatin 03/10/2015 CA 125 on 1013 2016-42  CTs of the chest, abdomen, and pelvis 05/31/2015 with no evidence of progressive ovarian cancer  CTs of the chest, abdomen, and pelvis 08/07/2015 and 08/08/2015-no evidence of progressive ovarian cancer  Peritoneal studding noted at the time of the cholecystectomy procedure 08/09/2015 Elevated CA 125 10/07/2015  CT 10/07/2015 with stricturing at the sigmoid colon, constipation, and omental nodularity  Initiation of salvage weekly Taxol chemotherapy 10/13/2015  Taxol changed to every 2 weeks beginning 01/19/2016 due to painful neuropathy.  CT scans 07/19/2016 with no acute findings. No features in the abdomen or pelvis to suggest recurrent disease.Stable mild fullness right intrarenal collecting  system.  Elevated CA 125 treatment resumed with Taxol/Avastin on a 2 week schedule 09/27/2016 2. COPD 3. Dyspnea secondary to COPD and the large right pleural effusion, status post therapeutic thoracentesis procedures 09/30/2014,10/09/2014, and 10/21/2014. Left thoracentesis 10/30/2014 4. Left nephrectomy as a child  5. Delayed nausea following cycle 1 Taxol/carboplatin, Aloxi/Emend added with cycle 2 with improvement. 6. Right lower extremity edema, right calf pain 12/09/2014. Negative venous Doppler 12/09/2014. 7. Diffuse pruritus following cycle 1 adjuvant Taxol/carboplatin 01/20/2015, no rash, resolved with a steroid dose pack 8. Thrombocytopenia second to chemotherapy, the carboplatin was dose reduced with cycle 2 adjuvant Taxol/carboplatin 02/10/2015 9. Chemotherapy-induced peripheral neuropathy-painful peripheral neuropathy involving the feet 01/19/2016.  10. Admission with acute cholecystitis 08/07/2015, status post a cholecystectomy 08/09/2015 11. Admission 10/08/2015 with abdominal pain/constipation, improved with bowel rest and laxatives 12. Mild right hydronephrosis noted on the CT 10/07/2015. Renal ultrasound 12/16/2015 with no hydronephrosis noted.No hydronephrosis on CT 07/19/2016.   Disposition:  She tolerated the Avastin well. The plan is to continue every 2 week Taxol/Avastin with close clinical and CA 125 follow-up. We will schedule a restaging CT evaluation if the abdominal pain and diarrhea persist.  Lynn Ramirez will return for an office visit and chemotherapy in 2 weeks.  25 minutes were spent with the patient today. The majority of the time was used for counseling and coordination of care.  Betsy Coder, MD  10/12/2016  10:06 AM

## 2016-10-12 NOTE — Patient Instructions (Signed)
[facility] Discharge Instructions for Patients Receiving Chemotherapy  Today you received the following chemotherapy agents:  Taxol and Avastin.  To help prevent nausea and vomiting after your treatment, we encourage you to take your nausea medication as prescribed.   If you develop nausea and vomiting that is not controlled by your nausea medication, call the clinic.   BELOW ARE SYMPTOMS THAT SHOULD BE REPORTED IMMEDIATELY:  *FEVER GREATER THAN 100.5 F  *CHILLS WITH OR WITHOUT FEVER  NAUSEA AND VOMITING THAT IS NOT CONTROLLED WITH YOUR NAUSEA MEDICATION  *UNUSUAL SHORTNESS OF BREATH  *UNUSUAL BRUISING OR BLEEDING  TENDERNESS IN MOUTH AND THROAT WITH OR WITHOUT PRESENCE OF ULCERS  *URINARY PROBLEMS  *BOWEL PROBLEMS  UNUSUAL RASH Items with * indicate a potential emergency and should be followed up as soon as possible.  Feel free to call the clinic you have any questions or concerns. The clinic phone number is [phone].  Please show the [facility] at check-in to the Emergency Department and triage nurse.  (Avastin) Bevacizumab What is this medicine? BEVACIZUMAB (be va SIZ yoo mab) is a monoclonal antibody. It is used to treat many types of cancer. This medicine may be used for other purposes; ask your health care provider or pharmacist if you have questions. COMMON BRAND NAME(S): Avastin What should I tell my health care provider before I take this medicine? They need to know if you have any of these conditions: -diabetes -heart disease -high blood pressure -history of coughing up blood -prior anthracycline chemotherapy (e.g., doxorubicin, daunorubicin, epirubicin) -recent or ongoing radiation therapy -recent or planning to have surgery -stroke -an unusual or allergic reaction to bevacizumab, hamster proteins, mouse proteins, other medicines, foods, dyes, or preservatives -pregnant or trying to get pregnant -breast-feeding How should I use  this medicine? This medicine is for infusion into a vein. It is given by a health care professional in a hospital or clinic setting. Talk to your pediatrician regarding the use of this medicine in children. Special care may be needed. Overdosage: If you think you have taken too much of this medicine contact a poison control center or emergency room at once. NOTE: This medicine is only for you. Do not share this medicine with others. What if I miss a dose? It is important not to miss your dose. Call your doctor or health care professional if you are unable to keep an appointment. What may interact with this medicine? Interactions are not expected. This list may not describe all possible interactions. Give your health care provider a list of all the medicines, herbs, non-prescription drugs, or dietary supplements you use. Also tell them if you smoke, drink alcohol, or use illegal drugs. Some items may interact with your medicine. What should I watch for while using this medicine? Your condition will be monitored carefully while you are receiving this medicine. You will need important blood work and urine testing done while you are taking this medicine. This medicine may increase your risk to bruise or bleed. Call your doctor or health care professional if you notice any unusual bleeding. This medicine should be started at least 28 days following major surgery and the site of the surgery should be totally healed. Check with your doctor before scheduling dental work or surgery while you are receiving this treatment. Talk to your doctor if you have recently had surgery or if you have a wound that has not healed. Do not become pregnant while taking this medicine or for  6 months after stopping it. Women should inform their doctor if they wish to become pregnant or think they might be pregnant. There is a potential for serious side effects to an unborn child. Talk to your health care professional or pharmacist  for more information. Do not breast-feed an infant while taking this medicine and for 6 months after the last dose. This medicine has caused ovarian failure in some women. This medicine may interfere with the ability to have a child. You should talk to your doctor or health care professional if you are concerned about your fertility. What side effects may I notice from receiving this medicine? Side effects that you should report to your doctor or health care professional as soon as possible: -allergic reactions like skin rash, itching or hives, swelling of the face, lips, or tongue -chest pain or chest tightness -chills -coughing up blood -high fever -seizures -severe constipation -signs and symptoms of bleeding such as bloody or black, tarry stools; red or dark-brown urine; spitting up blood or brown material that looks like coffee grounds; red spots on the skin; unusual bruising or bleeding from the eye, gums, or nose -signs and symptoms of a blood clot such as breathing problems; chest pain; severe, sudden headache; pain, swelling, warmth in the leg -signs and symptoms of a stroke like changes in vision; confusion; trouble speaking or understanding; severe headaches; sudden numbness or weakness of the face, arm or leg; trouble walking; dizziness; loss of balance or coordination -stomach pain -sweating -swelling of legs or ankles -vomiting -weight gain Side effects that usually do not require medical attention (report to your doctor or health care professional if they continue or are bothersome): -back pain -changes in taste -decreased appetite -dry skin -nausea -tiredness This list may not describe all possible side effects. Call your doctor for medical advice about side effects. You may report side effects to FDA at 1-800-FDA-1088. Where should I keep my medicine? This drug is given in a hospital or clinic and will not be stored at home. NOTE: This sheet is a summary. It may not  cover all possible information. If you have questions about this medicine, talk to your doctor, pharmacist, or health care provider.  2018 Elsevier/Gold Standard (2016-06-23 14:33:29)

## 2016-10-12 NOTE — Patient Instructions (Signed)

## 2016-10-13 ENCOUNTER — Telehealth: Payer: Self-pay | Admitting: Oncology

## 2016-10-13 LAB — CA 125: CANCER ANTIGEN (CA) 125: 312.7 U/mL — AB (ref 0.0–38.1)

## 2016-10-13 NOTE — Telephone Encounter (Signed)
Scheduled appts per 10/12/2016 los. Patient aware of appt date and time and is my chart active.

## 2016-10-16 DIAGNOSIS — M25561 Pain in right knee: Secondary | ICD-10-CM | POA: Diagnosis not present

## 2016-10-16 DIAGNOSIS — M25562 Pain in left knee: Secondary | ICD-10-CM | POA: Diagnosis not present

## 2016-10-16 DIAGNOSIS — M25551 Pain in right hip: Secondary | ICD-10-CM | POA: Diagnosis not present

## 2016-10-16 DIAGNOSIS — M25552 Pain in left hip: Secondary | ICD-10-CM | POA: Diagnosis not present

## 2016-10-17 ENCOUNTER — Other Ambulatory Visit: Payer: Self-pay

## 2016-10-17 ENCOUNTER — Ambulatory Visit (HOSPITAL_COMMUNITY)
Admission: RE | Admit: 2016-10-17 | Discharge: 2016-10-17 | Disposition: A | Discharge status: Home / Self Care | Payer: Medicare Other | Source: Ambulatory Visit | Attending: Cardiovascular Disease | Admitting: Cardiovascular Disease

## 2016-10-17 ENCOUNTER — Ambulatory Visit (HOSPITAL_BASED_OUTPATIENT_CLINIC_OR_DEPARTMENT_OTHER): Payer: Medicare Other

## 2016-10-17 ENCOUNTER — Other Ambulatory Visit: Payer: Self-pay | Admitting: Nurse Practitioner

## 2016-10-17 DIAGNOSIS — E784 Other hyperlipidemia: Secondary | ICD-10-CM | POA: Diagnosis not present

## 2016-10-17 DIAGNOSIS — I071 Rheumatic tricuspid insufficiency: Secondary | ICD-10-CM | POA: Diagnosis not present

## 2016-10-17 DIAGNOSIS — C799 Secondary malignant neoplasm of unspecified site: Secondary | ICD-10-CM

## 2016-10-17 DIAGNOSIS — M79604 Pain in right leg: Secondary | ICD-10-CM | POA: Insufficient documentation

## 2016-10-17 DIAGNOSIS — E7849 Other hyperlipidemia: Secondary | ICD-10-CM

## 2016-10-17 DIAGNOSIS — I10 Essential (primary) hypertension: Secondary | ICD-10-CM | POA: Insufficient documentation

## 2016-10-17 DIAGNOSIS — M7989 Other specified soft tissue disorders: Secondary | ICD-10-CM

## 2016-10-17 DIAGNOSIS — Z8543 Personal history of malignant neoplasm of ovary: Secondary | ICD-10-CM | POA: Insufficient documentation

## 2016-10-17 DIAGNOSIS — I2583 Coronary atherosclerosis due to lipid rich plaque: Secondary | ICD-10-CM

## 2016-10-17 DIAGNOSIS — Z87891 Personal history of nicotine dependence: Secondary | ICD-10-CM | POA: Diagnosis not present

## 2016-10-17 DIAGNOSIS — I2510 Atherosclerotic heart disease of native coronary artery without angina pectoris: Secondary | ICD-10-CM | POA: Insufficient documentation

## 2016-10-17 DIAGNOSIS — J449 Chronic obstructive pulmonary disease, unspecified: Secondary | ICD-10-CM | POA: Insufficient documentation

## 2016-10-17 DIAGNOSIS — C482 Malignant neoplasm of peritoneum, unspecified: Secondary | ICD-10-CM

## 2016-10-17 MED ORDER — PERFLUTREN LIPID MICROSPHERE
1.0000 mL | INTRAVENOUS | Status: AC | PRN
  Administered 2016-10-17: 1 mL via INTRAVENOUS

## 2016-10-18 ENCOUNTER — Other Ambulatory Visit: Payer: Self-pay

## 2016-10-18 ENCOUNTER — Emergency Department (HOSPITAL_COMMUNITY): Payer: Medicare Other

## 2016-10-18 ENCOUNTER — Encounter (HOSPITAL_COMMUNITY): Payer: Self-pay | Admitting: Family Medicine

## 2016-10-18 ENCOUNTER — Emergency Department (HOSPITAL_COMMUNITY)
Admission: EM | Admit: 2016-10-18 | Discharge: 2016-10-18 | Disposition: A | Discharge status: Home / Self Care | Payer: Medicare Other | Attending: Emergency Medicine | Admitting: Emergency Medicine

## 2016-10-18 ENCOUNTER — Telehealth: Payer: Self-pay | Admitting: *Deleted

## 2016-10-18 DIAGNOSIS — Z79899 Other long term (current) drug therapy: Secondary | ICD-10-CM | POA: Insufficient documentation

## 2016-10-18 DIAGNOSIS — Z7969 Long term (current) use of other immunomodulators and immunosuppressants: Secondary | ICD-10-CM | POA: Insufficient documentation

## 2016-10-18 DIAGNOSIS — J441 Chronic obstructive pulmonary disease with (acute) exacerbation: Secondary | ICD-10-CM | POA: Diagnosis not present

## 2016-10-18 DIAGNOSIS — I2510 Atherosclerotic heart disease of native coronary artery without angina pectoris: Secondary | ICD-10-CM | POA: Diagnosis not present

## 2016-10-18 DIAGNOSIS — C801 Malignant (primary) neoplasm, unspecified: Secondary | ICD-10-CM

## 2016-10-18 DIAGNOSIS — Z91040 Latex allergy status: Secondary | ICD-10-CM | POA: Insufficient documentation

## 2016-10-18 DIAGNOSIS — I10 Essential (primary) hypertension: Secondary | ICD-10-CM | POA: Diagnosis not present

## 2016-10-18 DIAGNOSIS — Z87891 Personal history of nicotine dependence: Secondary | ICD-10-CM | POA: Diagnosis not present

## 2016-10-18 DIAGNOSIS — R4702 Dysphasia: Secondary | ICD-10-CM | POA: Diagnosis not present

## 2016-10-18 DIAGNOSIS — R0682 Tachypnea, not elsewhere classified: Secondary | ICD-10-CM | POA: Diagnosis not present

## 2016-10-18 DIAGNOSIS — R110 Nausea: Secondary | ICD-10-CM | POA: Diagnosis not present

## 2016-10-18 DIAGNOSIS — Z8543 Personal history of malignant neoplasm of ovary: Secondary | ICD-10-CM | POA: Diagnosis not present

## 2016-10-18 DIAGNOSIS — R0602 Shortness of breath: Secondary | ICD-10-CM | POA: Diagnosis not present

## 2016-10-18 LAB — BASIC METABOLIC PANEL
ANION GAP: 5 (ref 5–15)
BUN: 34 mg/dL — ABNORMAL HIGH (ref 6–20)
CO2: 31 mmol/L (ref 22–32)
Calcium: 9.5 mg/dL (ref 8.9–10.3)
Chloride: 105 mmol/L (ref 101–111)
Creatinine, Ser: 1.04 mg/dL — ABNORMAL HIGH (ref 0.44–1.00)
GFR calc Af Amer: >60 mL/min (ref >60)
GFR, EST NON AFRICAN AMERICAN: 52 mL/min — AB (ref >60)
GLUCOSE: 136 mg/dL — AB (ref 65–99)
POTASSIUM: 4.2 mmol/L (ref 3.5–5.1)
Sodium: 141 mmol/L (ref 135–145)

## 2016-10-18 LAB — CBC
HEMATOCRIT: 40.3 % (ref 36.0–46.0)
Hemoglobin: 12.8 g/dL (ref 12.0–15.0)
MCH: 30 pg (ref 26.0–34.0)
MCHC: 31.8 g/dL (ref 30.0–36.0)
MCV: 94.6 fL (ref 78.0–100.0)
Platelets: 253 10*3/uL (ref 150–400)
RBC: 4.26 MIL/uL (ref 3.87–5.11)
RDW: 15.3 % (ref 11.5–15.5)
WBC: 6.2 10*3/uL (ref 4.0–10.5)

## 2016-10-18 LAB — BRAIN NATRIURETIC PEPTIDE: B NATRIURETIC PEPTIDE 5: 32.5 pg/mL (ref 0.0–100.0)

## 2016-10-18 LAB — TROPONIN I

## 2016-10-18 MED ORDER — METHYLPREDNISOLONE SODIUM SUCC 125 MG IJ SOLR
125.0000 mg | Freq: Once | INTRAMUSCULAR | Status: AC
  Administered 2016-10-18: 125 mg via INTRAVENOUS
  Filled 2016-10-18: qty 2

## 2016-10-18 MED ORDER — PREDNISONE 10 MG PO TABS: 60.0000 mg | ORAL_TABLET | Freq: Every day | ORAL | 0 refills | Status: DC

## 2016-10-18 MED ORDER — IPRATROPIUM BROMIDE 0.02 % IN SOLN
0.5000 mg | Freq: Once | RESPIRATORY_TRACT | Status: AC
  Administered 2016-10-18: 0.5 mg via RESPIRATORY_TRACT
  Filled 2016-10-18: qty 2.5

## 2016-10-18 MED ORDER — ALBUTEROL (5 MG/ML) CONTINUOUS INHALATION SOLN
10.0000 mg/h | INHALATION_SOLUTION | RESPIRATORY_TRACT | Status: DC
  Administered 2016-10-18: 10 mg/h via RESPIRATORY_TRACT
  Filled 2016-10-18: qty 20

## 2016-10-18 NOTE — Telephone Encounter (Signed)
-----   Message from Dorothy Spark, MD sent at 10/18/2016  3:39 PM EDT ----- Unchanged echocardiogram, preserved LVEF, please repeat in 3 months with strain.

## 2016-10-18 NOTE — ED Triage Notes (Signed)
Patient is from home and transported via [facility]. At 20:00, patient developed shortness of breath. At 23:00, pt woke up with being unable to breath. When EMS arrived she was only able to talk 2-3 words at a time. EMS placed patient on C-pap. During transport, she has some improvement when she is able to a talk in full sentences and her color has improved. Pt was given Zofran 4mg  IV enroute to [facility]. Patient reports she had a ECHO yesterday and had diarrhea yesterday.

## 2016-10-18 NOTE — ED Provider Notes (Signed)
[facility] DEPT Provider Note   CSN: [id number] Arrival date & time: 10/18/16  0206   By signing my name below, I, Eunice Blase, attest that this documentation has been prepared under the direction and in the presence of Jola Schmidt, MD. Electronically signed, Eunice Blase, ED Scribe. 10/18/16. 2:34 AM.   History   Chief Complaint Chief Complaint  Patient presents with  . Respiratory Distress   The history is provided by the patient and medical records. No language interpreter was used.    Lynn Ramirez is a 76 y.o. female with h/o Oxygen dependent COPD who presents to the emergency department via EMS with concern for persistent episodic frequent SOB since 8 PM last night. Pt notes 1 episode at onset of symptoms yesterday relieved by inhaler and another onset ~between 11PM last night and 12 AM this morning for which she presents to [facility] ED currently. Speech difficulty, diarrhea noted. Pt placed on C-pap and given IV zofran 4 mg with mild relief to en route via EMS.  Denies fevers and chills.  Denies unilateral leg swelling.  No active chest pain this time.  Denies abdominal pain.  Symptoms are moderate in severity  Past Medical History:  Diagnosis Date  . Cancer ([place])    ovarian  . COPD (chronic obstructive pulmonary disease) ([place])   . Difficulty sleeping   . Eczema    hands  . Emphysema   . GERD (gastroesophageal reflux disease)   . H/O hydronephrosis   . History of transfusion    age 64  . Hypertension     Patient Active Problem List   Diagnosis Date Noted  . Pain and swelling of right lower extremity 10/02/2016  . Goals of care, counseling/discussion 09/26/2016  . HTN (hypertension) 08/07/2016  . B12 deficiency 08/07/2016  . Vitamin D deficiency 08/07/2016  . Diarrhea 07/06/2016  . Cramping of hands 07/06/2016  . Port catheter in place 11/02/2015  . Allergic rhinitis 11/02/2015  . Constipation   . Sigmoid stricture   . Obstipation 10/08/2015  . Abnormal  CXR   . COPD (chronic obstructive pulmonary disease) ([place])   . Acute cholecystitis 08/08/2015  . Solitary pulmonary nodule on lung CT 08/08/2015  . Acute calculous cholecystitis 08/08/2015  . Cholecystitis 08/07/2015  . CAD (coronary artery disease) 03/31/2015  . Hyperlipidemia 03/31/2015  . Claudication ([place]) 03/31/2015  . Chemotherapy-induced neuropathy ([place]) 03/25/2015  . Genetic testing 02/19/2015  . Malignant neoplasm of fallopian tube ([place]) 02/19/2015  . Family history of pancreatic cancer 02/19/2015  . Family history of breast cancer in female 02/19/2015  . Ovarian cancer ([place]) 12/29/2014  . Adenocarcinoma ([place])   . Primary peritoneal carcinomatosis ([place]) 10/23/2014  . Metastatic adenocarcinoma ([place]) 10/21/2014  . Insomnia 10/11/2014  . Pelvic mass in female 10/05/2014  . Encopresis 06/18/2014  . COPD  GOLD III 03/07/2012    Past Surgical History:  Procedure Laterality Date  . ABDOMINAL HYSTERECTOMY    . CHOLECYSTECTOMY N/A 08/09/2015   Procedure: Attempted LAPAROSCOPIC coverted  open CHOLECYSTECTOMY WITH INTRAOPERATIVE CHOLANGIOGRAM;  Surgeon: Autumn Messing III, MD;  Location: [facility] ORS;  Service: General;  Laterality: N/A;  . HAND SURGERY Right 1980's  . LAPAROTOMY N/A 12/29/2014   Procedure:  LAPAROTOMY;  Surgeon: Everitt Amber, MD;  Location: [facility] ORS;  Service: Gynecology;  Laterality: N/A;  . NEPHRECTOMY    . ROBOTIC ASSISTED TOTAL HYSTERECTOMY WITH BILATERAL SALPINGO OOPHERECTOMY Bilateral 12/29/2014   Procedure: ROBOTIC ASSISTED TOTAL LAPAROSCOPIC HYSTERECTOMY WITH BILATERAL SALPINGO OOPHORECTOMY AND OOMENTECTOMY WITH RADICAL  TUMOR DEBULKIING ;  Surgeon: Everitt Amber, MD;  Location: [facility] ORS;  Service: Gynecology;  Laterality: Bilateral;  . THORACENTESIS     several  . TONSILLECTOMY    . TUBAL LIGATION      OB History    No data available       Home Medications    Prior to Admission medications   Medication Sig Start Date End Date Taking? Authorizing Provider    Acetaminophen (TYLENOL) 325 MG CAPS Take 1 tablet by mouth 3 (three) times daily as needed (pain).     Historical Provider, MD  antiseptic oral rinse (BIOTENE) LIQD 15 mLs by Mouth Rinse route 3 (three) times daily.    Historical Provider, MD  atorvastatin (LIPITOR) 10 MG tablet TAKE 1 TABLET(10 MG) BY MOUTH DAILY 07/28/16   Dorothy Spark, MD  Biotin 2500 MCG CAPS Take 1 tablet by mouth daily.    Historical Provider, MD  carvedilol (COREG) 3.125 MG tablet Take 1 tablet (3.125 mg total) by mouth 2 (two) times daily with a meal. 10/02/16   Dorothy Spark, MD  cholecalciferol (VITAMIN D) 1000 units tablet Take 1,000 Units by mouth daily.    Historical Provider, MD  diphenoxylate-atropine (LOMOTIL) 2.5-0.025 MG tablet TAKE 2 TABLETS BY MOUTH FOUR TIMES DAILY AS NEEDED FOR DIARRHEA OR LOOSE STOOLS 10/17/16   Susanne Borders, NP  docusate sodium (COLACE) 100 MG capsule Take 1 capsule (100 mg total) by mouth 2 (two) times daily. Patient not taking: Reported on 10/12/2016 10/11/15   Orson Eva, MD  famotidine (PEPCID) 10 MG tablet Take 20 mg by mouth daily.     Historical Provider, MD  fluocinonide cream (LIDEX) 3.41 % Apply 1 application topically 2 (two) times daily as needed (For ezcema).     Historical Provider, MD  gabapentin (NEURONTIN) 300 MG capsule Take 300 mg by mouth at bedtime. 09/05/16   Historical Provider, MD  lidocaine-prilocaine (EMLA) cream Apply to port site one hour prior to use. Do not rub in. Cover with plastic. 10/12/16   Ladell Pier, MD  loperamide (IMODIUM) 2 MG capsule Take by mouth as needed for diarrhea or loose stools.    Historical Provider, MD  Omega-3 Fatty Acids (OMEGA-3 PO) Take 1 capsule by mouth daily.    Historical Provider, MD  ondansetron (ZOFRAN-ODT) 4 MG disintegrating tablet Take 1 tablet (4 mg total) by mouth every 8 (eight) hours as needed for nausea or vomiting. 10/02/16   Ladell Pier, MD  oxyCODONE-acetaminophen (PERCOCET/ROXICET) 5-325 MG tablet Take 1  tablet by mouth every 6 (six) hours as needed for severe pain. 07/19/16   [name], NP  polyethylene glycol (MIRALAX / GLYCOLAX) packet Take 17 g by mouth daily. 10/11/15   Orson Eva, MD  PROAIR HFA 108 [phone] Base) MCG/ACT inhaler INHALE 2 PUFFS INTO THE LUNGS FOUR TIMES DAILY AS NEEDED FOR WHEEZING 02/08/16   Collene Gobble, MD  senna (SENOKOT) 8.6 MG TABS tablet Take 2 tablets (17.2 mg total) by mouth daily. 10/11/15   Orson Eva, MD  Tiotropium Bromide-Olodaterol (STIOLTO RESPIMAT) 2.5-2.5 MCG/ACT AERS Inhale 2 puffs into the lungs daily. 10/05/16   Collene Gobble, MD  traMADol (ULTRAM) 50 MG tablet TAKE 1 TABLET BY MOUTH EVERY 12 HOURS AS NEEDED FOR PAIN 10/02/16   Ladell Pier, MD  triamterene-hydrochlorothiazide (MAXZIDE-25) 37.5-25 MG tablet Take 0.5 tablets by mouth daily.     Historical Provider, MD  vitamin B-12 (CYANOCOBALAMIN) 100 MCG tablet Take 100  mcg by mouth daily. Reported on 01/19/2016    Historical Provider, MD  zolpidem (AMBIEN) 5 MG tablet Take 1 tablet (5 mg total) by mouth at bedtime as needed for sleep. 06/14/16   Ladell Pier, MD    Family History Family History  Problem Relation Age of Onset  . Diabetes Father   . Lung cancer Father 35    metastasis to liver  . Cancer Father     liver  . Pancreatic cancer Mother 86  . Stroke Paternal Grandfather   . Heart disease Maternal Aunt   . Diabetes Paternal Uncle   . Heart Problems Maternal Grandmother   . Heart Problems Maternal Grandfather   . Diabetes Paternal Grandmother   . Stroke Paternal Grandmother   . Heart Problems Paternal Uncle   . Cancer Cousin     unknown type  . Breast cancer Cousin     dx. 75s  . Leukemia Cousin     dx. 16-17  . Colon cancer Neg Hx   . Stomach cancer Neg Hx     Social History Social History  Substance Use Topics  . Smoking status: Former Smoker    Packs/day: 1.00    Years: 40.00    Types: Cigarettes    Quit date: 07/10/2013  . Smokeless tobacco: Never Used  . Alcohol use 0.0  oz/week     Comment: 1-2 a week     Allergies   Latex and Penicillins   Review of Systems Review of Systems All other systems reviewed and all systems are negative for acute changes except as noted in the HPI and PMH.    Physical Exam Updated Vital Signs BP 135/82 (BP Location: Left Arm)   Pulse 91   Temp 97.7 F (36.5 C) (Oral)   Resp (!) 21   Ht 5\' 1"  (1.549 m)   Wt 189 lb (85.7 kg)   SpO2 98% Comment: on Cipap 78 Room Air  BMI 35.71 kg/m   Physical Exam  Constitutional: She is oriented to person, place, and time. She appears well-developed and well-nourished. No distress.  HENT:  Head: Normocephalic and atraumatic.  Eyes: EOM are normal.  Neck: Normal range of motion.  Cardiovascular: Normal rate, regular rhythm and normal heart sounds.   Pulmonary/Chest: She is in respiratory distress. She has decreased breath sounds in the right upper field, the right middle field, the right lower field, the left upper field, the left middle field and the left lower field.  Abdominal: Soft. She exhibits no distension. There is no tenderness.  Musculoskeletal: Normal range of motion.  Neurological: She is alert and oriented to person, place, and time.  Skin: Skin is warm and dry.  Psychiatric: She has a normal mood and affect. Judgment normal.  Nursing note and vitals reviewed.    ED Treatments / Results  DIAGNOSTIC STUDIES: Oxygen Saturation is 78% on RA, 98% on c-pap; low by my interpretation.    COORDINATION OF CARE: 2:27 AM Discussed treatment plan with pt at bedside and pt agreed to plan. Will order breathing treatment and reassess.  Labs (all labs ordered are listed, but only abnormal results are displayed) Labs Reviewed  BASIC METABOLIC PANEL - Abnormal; Notable for the following:       Result Value   Glucose, Bld 136 (*)    BUN 34 (*)    Creatinine, Ser 1.04 (*)    GFR calc non Af Amer 52 (*)    All other components within normal limits  CBC  TROPONIN I  BRAIN  NATRIURETIC PEPTIDE    EKG  EKG Interpretation  Date/Time:  Wednesday October 18 2016 02:11:23 EDT Ventricular Rate:  103 PR Interval:    QRS Duration: 104 QT Interval:  332 QTC Calculation: 435 R Axis:   97 Text Interpretation:  Sinus tachycardia Right axis deviation Low voltage, precordial leads Minimal ST depression, inferior leads No significant change was found Confirmed by Krissie Merrick  MD, Lennette Bihari ([id number]) on 10/18/2016 5:49:10 AM       Radiology Dg Chest Portable 1 View  Result Date: 10/18/2016 CLINICAL DATA:  Acute onset of shortness of breath. Initial encounter. EXAM: PORTABLE CHEST 1 VIEW COMPARISON:  Chest radiograph performed 09/04/2016 FINDINGS: The lungs are well-aerated. Mild vascular congestion is noted. Increased interstitial markings may reflect minimal interstitial edema. There is no evidence of pleural effusion or pneumothorax. The cardiomediastinal silhouette is within normal limits. No acute osseous abnormalities are seen. A right-sided chest port is noted ending about the mid SVC. IMPRESSION: Mild vascular congestion. Increased interstitial markings may reflect minimal interstitial edema. Electronically Signed   By: Garald Balding M.D.   On: 10/18/2016 03:14    Procedures Procedures (including critical care time)  Medications Ordered in ED Medications  albuterol (PROVENTIL,VENTOLIN) solution continuous neb (10 mg/hr Nebulization New Bag/Given 10/18/16 0305)  ipratropium (ATROVENT) nebulizer solution 0.5 mg (0.5 mg Nebulization Given 10/18/16 0305)  methylPREDNISolone sodium succinate (SOLU-MEDROL) 125 mg/2 mL injection 125 mg (125 mg Intravenous Given 10/18/16 0519)     Initial Impression / Assessment and Plan / ED Course  I have reviewed the triage vital signs and the nursing notes.  Pertinent labs & imaging results that were available during my care of the patient were reviewed by me and considered in my medical decision making (see chart for details).     5:49  AM Patient is overall well-appearing.  She feels much better at this time.  She is moving better air.  I suspect this is COPD exacerbation.  Doubt PE.  Discharge home with prednisone and albuterol.  Patient understands return to ER for new or worsening symptoms.  She ambulate around the emergency department without any hypoxia.  She feels better.  She understands she is welcome to return for worsening symptoms  Final Clinical Impressions(s) / ED Diagnoses   Final diagnoses:  COPD exacerbation ([place])  2 I personally performed the services described in this documentation, which was scribed in my presence. The recorded information has been reviewed and is accurate.     New Prescriptions New Prescriptions   PREDNISONE (DELTASONE) 10 MG TABLET    Take 6 tablets (60 mg total) by mouth daily.   I personally performed the services described in this documentation, which was scribed in my presence. The recorded information has been reviewed and is accurate.       Jola Schmidt, MD 10/18/16 [phone]

## 2016-10-18 NOTE — ED Notes (Signed)
Lab called to add troponin and BNP to previously drawn blood.

## 2016-10-18 NOTE — ED Notes (Signed)
Radiology at bedside

## 2016-10-18 NOTE — ED Notes (Signed)
Ambulated Pt, O2 stayed between 91 and 92

## 2016-10-18 NOTE — Telephone Encounter (Signed)
Notified the pt that per Dr Meda Coffee, her echo is unchanged from prior echo, preserved LVEF, and we will repeat this in 3 months to be done, with STRAIN.  Informed the pt that I will place the order in the system and have a [facility] scheduler to call her back to arrange echo with STRAIN for 3 months out.  Pt verbalized understanding and agrees with this plan.

## 2016-10-18 NOTE — ED Notes (Signed)
Patient given water, ok per provider.  

## 2016-10-19 ENCOUNTER — Telehealth: Payer: Self-pay | Admitting: *Deleted

## 2016-10-19 ENCOUNTER — Encounter: Payer: Self-pay | Admitting: Emergency Medicine

## 2016-10-19 ENCOUNTER — Ambulatory Visit: Payer: Medicare Other

## 2016-10-19 ENCOUNTER — Ambulatory Visit (INDEPENDENT_AMBULATORY_CARE_PROVIDER_SITE_OTHER): Payer: Medicare Other | Admitting: Emergency Medicine

## 2016-10-19 DIAGNOSIS — I2583 Coronary atherosclerosis due to lipid rich plaque: Secondary | ICD-10-CM | POA: Diagnosis not present

## 2016-10-19 DIAGNOSIS — C799 Secondary malignant neoplasm of unspecified site: Secondary | ICD-10-CM

## 2016-10-19 DIAGNOSIS — J449 Chronic obstructive pulmonary disease, unspecified: Secondary | ICD-10-CM

## 2016-10-19 DIAGNOSIS — I2510 Atherosclerotic heart disease of native coronary artery without angina pectoris: Secondary | ICD-10-CM

## 2016-10-19 MED ORDER — TIOTROPIUM BROMIDE-OLODATEROL 2.5-2.5 MCG/ACT IN AERS: 2.0000 | INHALATION_SPRAY | Freq: Every day | RESPIRATORY_TRACT | 5 refills | Status: DC

## 2016-10-19 NOTE — Telephone Encounter (Signed)
Message from pt reporting diarrhea for past 7 days. Taking Lomotil and Imodium with no relief. Returned call, no answer. Pt returned call, she reports diarrhea every other day since last chemo treatment. Pt is having bowel urgency and incontinence. Instructed pt to come in to give a stool sample for CDiff testing. She voiced understanding.

## 2016-10-19 NOTE — Progress Notes (Signed)
Subjective:    Patient ID: Lynn Ramirez, female    DOB: 06-17-1941, 76 y.o.   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 History is taken from the patient and from her husband. She completed chemo August, is following with Dr Benay Spice, Dr Denman George. She has been tired, is having some neuropathy. Her breathing is stable. She is on spiriva and symbicort. Uses SABA rarely. She is interested in doing a trial off of spiriva. I personally reviewed her pulmonary function testing from 05/06/2012. This showed severe obstructive lung disease without bronchodilator response.   ROV 05/06/15 -- follow-up visit for COPD and pleural effusions in the setting of serous carcinoma on chemotherapy. At her last visit we tried stopping Spiriva to see if she would tolerate this. She doesn't notice any difference in her breathing off this. Her chemo ended 03/10/15. She still has periods of severe fatigue that can influence her breathing. She remains on Symbicort. She wants the flu shot and Prevnar-13, has now been off chemo x 7 weeks. She underwent cardiac stress testing 10/11 that was reassuring. Also tested her LE perfusion > no abnormality.   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 She was recently started on Taxol as salvage chemotherapy. She is on Symbicort, was started back on Spiriva in mid December and believes that she has probably benefited. Symbicort seems to do the most for her. She does get fatigued w chemo, is associated with some dyspnea. She is able to go to the [facility] and exercise. She rarely uses albuterol. She does not cough often, she does have a hoarse voice recently due to allergies. She is on loratadine now.   ROV 04/04/16 -- this follow-up visit for history of COPD and bilateral pleural effusions, resolved. She  also has a history of ovarian/fallopian carcinoma being treated with chemotherapy. Had difficulty w neuropathy on taxol.   At her last visit in April we did a trial off of Spiriva to see if she would miss this. She continued Symbicort at that time. She was able to tolerate coming off the Spiriva, no significant clinical change. Not needing her SABA. She is planning to go to [facility] in October.   ROV 10/05/16 -- visit for patient with a history of COPD, hx pleural effusions as detailed above. Her current BD is Symbicort. Her last CXR was 09/04/16 that I have reviewed - no pleural fluid. She is having more dyspnea on exertion. She restarted chemotherapy in march for progression of her fallopian CA. She uses albuterol 1-3x a day. She is on RA. Has not noticed any desats at home.   ROV 10/19/16 -- follow up visit for COPD, hx ovarian CA w pleural effusions. She went to the ED with dyspnea 4/11. She developed diarrhea (has been a problem since she has been back on chemo), had some dyspnea in the evening that responded to SABA. Then she awakened from sleep w dyspnea. In the ED she received nebs, was started on steroids. She is on Stiolto. She is having some nasal congestion, dry nose from her O2. She feels better.    Review of Systems  As per HPI     Objective:   Physical Exam Vitals:   10/19/16 1641  BP: 134/74  Pulse: 68  SpO2: 96%  Weight: 187 lb 6.4 oz (85 kg)  Height: 5\' 1"  (1.549 m)   Gen: Pleasant, well-nourished, in no distress,  normal affect  ENT: No lesions,  mouth clear,  oropharynx clear, no postnasal drip  Neck: No JVD, no TMG, no carotid bruits  Lungs: No use of accessory muscles, small breaths, clear without rales or rhonchi  Cardiovascular: RRR, heart sounds normal, no murmur or gallops, no peripheral edema  Musculoskeletal: No deformities, no cyanosis or clubbing  Neuro: alert, non focal  Skin: Warm, no lesions or rashes      Assessment & Plan:  COPD (chronic  obstructive pulmonary disease) ([place]) With recent AE-COPD, improved after treatment.   Please continue your stiolto once a day Take albuterol 2 puffs up to every 4 hours if needed for shortness of breath.  Finish the prednisone as prescribed.  Continue your oxygen 2L/min  Follow with Dr Lamonte Sakai in 3 months or sooner if you have any problems.  Baltazar Apo, MD, PhD 10/19/2016, 5:16 PM [facility] [phone] or if no answer [phone]

## 2016-10-19 NOTE — Addendum Note (Signed)
Addended by: Jannette Spanner on: 10/19/2016 05:27 PM   Modules accepted: Orders

## 2016-10-19 NOTE — Patient Instructions (Signed)
Please continue your stiolto once a day Take albuterol 2 puffs up to every 4 hours if needed for shortness of breath.  Finish the prednisone as prescribed.  Continue your oxygen 2L/min  Follow with Dr Lamonte Sakai in 3 months or sooner if you have any problems.

## 2016-10-19 NOTE — Assessment & Plan Note (Signed)
With recent AE-COPD, improved after treatment.   Please continue your stiolto once a day Take albuterol 2 puffs up to every 4 hours if needed for shortness of breath.  Finish the prednisone as prescribed.  Continue your oxygen 2L/min  Follow with Dr Lamonte Sakai in 3 months or sooner if you have any problems.

## 2016-10-20 NOTE — Telephone Encounter (Signed)
Called pt, she reports diarrhea has improved slightly. She was unable to give a stool specimen on 4/12. She will bring it in today. She understands to hold anti-diarrheals until she is able to give sample.

## 2016-10-22 ENCOUNTER — Other Ambulatory Visit: Payer: Self-pay | Admitting: Oncology

## 2016-10-25 ENCOUNTER — Ambulatory Visit (HOSPITAL_BASED_OUTPATIENT_CLINIC_OR_DEPARTMENT_OTHER): Payer: Medicare Other | Admitting: Nurse Practitioner

## 2016-10-25 ENCOUNTER — Telehealth: Payer: Self-pay | Admitting: Nurse Practitioner

## 2016-10-25 ENCOUNTER — Ambulatory Visit: Payer: Medicare Other

## 2016-10-25 ENCOUNTER — Other Ambulatory Visit (HOSPITAL_BASED_OUTPATIENT_CLINIC_OR_DEPARTMENT_OTHER): Payer: Medicare Other

## 2016-10-25 ENCOUNTER — Ambulatory Visit (HOSPITAL_BASED_OUTPATIENT_CLINIC_OR_DEPARTMENT_OTHER): Payer: Medicare Other

## 2016-10-25 VITALS — BP 128/62 | HR 85 | Temp 97.6°F | Resp 22 | Wt 186.7 lb

## 2016-10-25 DIAGNOSIS — C482 Malignant neoplasm of peritoneum, unspecified: Secondary | ICD-10-CM

## 2016-10-25 DIAGNOSIS — Z5111 Encounter for antineoplastic chemotherapy: Secondary | ICD-10-CM

## 2016-10-25 DIAGNOSIS — C569 Malignant neoplasm of unspecified ovary: Secondary | ICD-10-CM

## 2016-10-25 DIAGNOSIS — C5701 Malignant neoplasm of right fallopian tube: Secondary | ICD-10-CM

## 2016-10-25 DIAGNOSIS — C786 Secondary malignant neoplasm of retroperitoneum and peritoneum: Secondary | ICD-10-CM

## 2016-10-25 DIAGNOSIS — N1339 Other hydronephrosis: Secondary | ICD-10-CM | POA: Diagnosis not present

## 2016-10-25 DIAGNOSIS — Z95828 Presence of other vascular implants and grafts: Secondary | ICD-10-CM

## 2016-10-25 LAB — COMPREHENSIVE METABOLIC PANEL
ALT: 23 U/L (ref 0–55)
AST: 13 U/L (ref 5–34)
Albumin: 2.9 g/dL — ABNORMAL LOW (ref 3.5–5.0)
Alkaline Phosphatase: 88 U/L (ref 40–150)
Anion Gap: 9 mEq/L (ref 3–11)
BILIRUBIN TOTAL: 0.41 mg/dL (ref 0.20–1.20)
BUN: 26.2 mg/dL — AB (ref 7.0–26.0)
CO2: 33 meq/L — AB (ref 22–29)
Calcium: 9.2 mg/dL (ref 8.4–10.4)
Chloride: 98 mEq/L (ref 98–109)
Creatinine: 0.9 mg/dL (ref 0.6–1.1)
EGFR: 61 mL/min/{1.73_m2} — ABNORMAL LOW (ref >90)
GLUCOSE: 114 mg/dL (ref 70–140)
POTASSIUM: 4.1 meq/L (ref 3.5–5.1)
SODIUM: 140 meq/L (ref 136–145)
TOTAL PROTEIN: 6.2 g/dL — AB (ref 6.4–8.3)

## 2016-10-25 LAB — CBC WITH DIFFERENTIAL/PLATELET
BASO%: 0.2 % (ref 0.0–2.0)
Basophils Absolute: 0 10*3/uL (ref 0.0–0.1)
EOS ABS: 0 10*3/uL (ref 0.0–0.5)
EOS%: 0.4 % (ref 0.0–7.0)
HEMATOCRIT: 41.3 % (ref 34.8–46.6)
HEMOGLOBIN: 12.8 g/dL (ref 11.6–15.9)
LYMPH#: 1.2 10*3/uL (ref 0.9–3.3)
LYMPH%: 22.5 % (ref 14.0–49.7)
MCH: 29.1 pg (ref 25.1–34.0)
MCHC: 31 g/dL — ABNORMAL LOW (ref 31.5–36.0)
MCV: 93.9 fL (ref 79.5–101.0)
MONO#: 0.4 10*3/uL (ref 0.1–0.9)
MONO%: 8.3 % (ref 0.0–14.0)
NEUT%: 68.6 % (ref 38.4–76.8)
NEUTROS ABS: 3.6 10*3/uL (ref 1.5–6.5)
Platelets: 213 10*3/uL (ref 145–400)
RBC: 4.4 10*6/uL (ref 3.70–5.45)
RDW: 15.7 % — ABNORMAL HIGH (ref 11.2–14.5)
WBC: 5.3 10*3/uL (ref 3.9–10.3)

## 2016-10-25 LAB — UA PROTEIN, DIPSTICK - CHCC: Protein, ur: NEGATIVE mg/dL

## 2016-10-25 MED ORDER — DIPHENHYDRAMINE HCL 50 MG/ML IJ SOLN
25.0000 mg | Freq: Once | INTRAMUSCULAR | Status: AC
  Administered 2016-10-25: 25 mg via INTRAVENOUS

## 2016-10-25 MED ORDER — FAMOTIDINE IN NACL 20-0.9 MG/50ML-% IV SOLN
20.0000 mg | Freq: Once | INTRAVENOUS | Status: AC
  Administered 2016-10-25: 20 mg via INTRAVENOUS

## 2016-10-25 MED ORDER — DIPHENHYDRAMINE HCL 50 MG/ML IJ SOLN
INTRAMUSCULAR | Status: AC
  Filled 2016-10-25: qty 1

## 2016-10-25 MED ORDER — SODIUM CHLORIDE 0.9% FLUSH
10.0000 mL | INTRAVENOUS | Status: DC | PRN
  Administered 2016-10-25: 10 mL
  Filled 2016-10-25: qty 10

## 2016-10-25 MED ORDER — DEXAMETHASONE SODIUM PHOSPHATE 10 MG/ML IJ SOLN
INTRAMUSCULAR | Status: AC
  Filled 2016-10-25: qty 1

## 2016-10-25 MED ORDER — HEPARIN SOD (PORK) LOCK FLUSH 100 UNIT/ML IV SOLN
500.0000 [IU] | Freq: Once | INTRAVENOUS | Status: AC | PRN
  Administered 2016-10-25: 500 [IU]
  Filled 2016-10-25: qty 5

## 2016-10-25 MED ORDER — DEXAMETHASONE SODIUM PHOSPHATE 10 MG/ML IJ SOLN
5.0000 mg | Freq: Once | INTRAMUSCULAR | Status: AC
  Administered 2016-10-25: 5 mg via INTRAVENOUS

## 2016-10-25 MED ORDER — SODIUM CHLORIDE 0.9 % IV SOLN
Freq: Once | INTRAVENOUS | Status: AC
  Administered 2016-10-25: 13:00:00 via INTRAVENOUS

## 2016-10-25 MED ORDER — FAMOTIDINE IN NACL 20-0.9 MG/50ML-% IV SOLN
INTRAVENOUS | Status: AC
  Filled 2016-10-25: qty 50

## 2016-10-25 MED ORDER — SODIUM CHLORIDE 0.9 % IJ SOLN
10.0000 mL | INTRAMUSCULAR | Status: DC | PRN
  Administered 2016-10-25: 10 mL via INTRAVENOUS
  Filled 2016-10-25: qty 10

## 2016-10-25 MED ORDER — SODIUM CHLORIDE 0.9 % IV SOLN
10.5000 mg/kg | Freq: Once | INTRAVENOUS | Status: AC
  Administered 2016-10-25: 900 mg via INTRAVENOUS
  Filled 2016-10-25: qty 32

## 2016-10-25 MED ORDER — DEXTROSE 5 % IV SOLN
80.0000 mg/m2 | Freq: Once | INTRAVENOUS | Status: AC
  Administered 2016-10-25: 150 mg via INTRAVENOUS
  Filled 2016-10-25: qty 25

## 2016-10-25 NOTE — Progress Notes (Signed)
[facility] OFFICE PROGRESS NOTE   Diagnosis:  Ovarian cancer  INTERVAL HISTORY:   Lynn Ramirez returns as scheduled. She completed cycle 2 Taxol/Avastin 10/12/2016. She was seen in the emergency department 10/18/2016 for evaluation of dyspnea felt to be secondary to COPD exacerbation. She has occasional mild nausea. She does not feel the nausea is related to chemotherapy. No mouth sores. Bowel habits continue to alternate between loose and soft stools. She thinks the neuropathy might be slightly worse in her feet but she is not completely sure. She notes blood with nose blowing. No other bleeding. Breathing is "much better". She reports recent bilateral knee injections with significant improvement in pain.  Objective:  Vital signs in last 24 hours:  Blood pressure 128/62, pulse 85, temperature 97.6 F (36.4 C), temperature source Oral, resp. rate (!) 22, weight 186 lb 11.2 oz (84.7 kg), SpO2 96 %.    HEENT: No thrush or ulcers. Resp: Distant breath sounds. No wheezing. No respiratory distress. Cardio: Regular rate and rhythm. GI: Abdomen is soft with mild tenderness over the upper abdomen. No hepatomegaly. No mass. Vascular: No leg edema. Port-A-Cath without erythema.    Lab Results:  Lab Results  Component Value Date   WBC 5.3 10/25/2016   HGB 12.8 10/25/2016   HCT 41.3 10/25/2016   MCV 93.9 10/25/2016   PLT 213 10/25/2016   NEUTROABS 3.6 10/25/2016    Imaging:  No results found.  Medications: I have reviewed the patient's current medications.  Assessment/Plan: 1. 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, immunohistochemical profile consistent with a GYN primary, elevated CA 125   Staging CTs of the chest, abdomen, and pelvis on 10/06/2014 revealed a loculated right pleural  effusion, ascites, and omental/mesenteric thickening   Cytology from peritoneal fluid 10/13/2014 revealed malignant cells consistent with metastatic adenocarcinoma Biopsy of an omental mass on 11/02/2014 revealed invasive serous carcinoma with psammoma bodies Cycle 1 Taxol/carboplatin 10/28/2014 Cycle 2 Taxol/carboplatin 11/18/2014 Cycle 3 Taxol/carboplatin 12/09/2014  CT scan 12/23/2014 with interval improvement in peritoneal carcinomatosis with near-complete resolution of ascites and decreased omental nodularity. Significant improvement in malignant right pleural effusion.  Status post robotic-assisted laparoscopic hysterectomy with bilateral salpingoophorectomy, omentectomy, radical tumor debulking 12/29/2014. Per Dr. Serita Grit office note 01/14/2015 cytoreduction was optimal with residual disease remaining only in the 1 mm implants on the small intestine. 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 Cycle 1 adjuvant Taxol/carboplatin 01/20/2015 Cycle 2 adjuvant Taxol/carboplatin 02/10/2015 Cycle 3 adjuvant Taxol/carboplatin 03/10/2015 CA 125 on 1013 2016-42  CTs of the chest, abdomen, and pelvis 05/31/2015 with no evidence of progressive ovarian cancer  CTs of the chest, abdomen, and pelvis 08/07/2015 and 08/08/2015-no evidence of progressive ovarian cancer  Peritoneal studding noted at the time of the cholecystectomy procedure 08/09/2015 Elevated CA 125 10/07/2015  CT 10/07/2015 with stricturing at the sigmoid colon, constipation, and omental nodularity  Initiation of salvage weekly Taxol chemotherapy 10/13/2015  Taxol changed to every 2 weeks beginning 01/19/2016 due to painful neuropathy.  CT scans  07/19/2016 with no acute findings. No features in the abdomen or pelvis to suggest recurrent disease.Stable mild fullness right intrarenal collecting system.  Elevated CA 125 treatment resumed with Taxol/Avastin on a 2 week schedule 09/27/2016 2. COPD 3. Dyspnea secondary to COPD and the  large right pleural effusion, status post therapeutic thoracentesis procedures 09/30/2014,10/09/2014, and 10/21/2014. Left thoracentesis 10/30/2014 4. Left nephrectomy as a child 5. Delayed nausea following cycle 1 Taxol/carboplatin, Aloxi/Emend added with cycle 2 with improvement. 6. Right lower extremity edema, right calf pain 12/09/2014. Negative venous Doppler 12/09/2014. 7. Diffuse pruritus following cycle 1 adjuvant Taxol/carboplatin 01/20/2015, no rash, resolved with a steroid dose pack 8. Thrombocytopenia second to chemotherapy, the carboplatin was dose reduced with cycle 2 adjuvant Taxol/carboplatin 02/10/2015 9. Chemotherapy-induced peripheral neuropathy-painful peripheral neuropathy involving the feet 01/19/2016.  10. Admission with acute cholecystitis 08/07/2015, status post a cholecystectomy 08/09/2015 11. Admission 10/08/2015 with abdominal pain/constipation, improved with bowel rest and laxatives 12. Mild right hydronephrosis noted on the CT 10/07/2015. Renal ultrasound 12/16/2015 with no hydronephrosis noted.No hydronephrosis on CT 07/19/2016.   Disposition: Lynn Ramirez appears stable. She has completed 2 cycles of Taxol/Avastin. Plan to proceed with cycle 3 today as scheduled. She will return for a follow-up visit and cycle 4 in 2 weeks. She will contact the office in the interim with any problems.  Plan reviewed with Dr. Benay Spice.    Ned Card ANP/GNP-BC   10/25/2016  11:52 AM

## 2016-10-25 NOTE — Patient Instructions (Signed)
Implanted Port Home Guide An implanted port is a type of central line that is placed under the skin. Central lines are used to provide IV access when treatment or nutrition needs to be given through a person's veins. Implanted ports are used for long-term IV access. An implanted port may be placed because:  You need IV medicine that would be irritating to the small veins in your hands or arms.  You need long-term IV medicines, such as antibiotics.  You need IV nutrition for a long period.  You need frequent blood draws for lab tests.  You need dialysis.  Implanted ports are usually placed in the chest area, but they can also be placed in the upper arm, the abdomen, or the leg. An implanted port has two main parts:  Reservoir. The reservoir is round and will appear as a small, raised area under your skin. The reservoir is the part where a needle is inserted to give medicines or draw blood.  Catheter. The catheter is a thin, flexible tube that extends from the reservoir. The catheter is placed into a large vein. Medicine that is inserted into the reservoir goes into the catheter and then into the vein.  How will I care for my incision site? Do not get the incision site wet. Bathe or shower as directed by your health care provider. How is my port accessed? Special steps must be taken to access the port:  Before the port is accessed, a numbing cream can be placed on the skin. This helps numb the skin over the port site.  Your health care provider uses a sterile technique to access the port. ? Your health care provider must put on a mask and sterile gloves. ? The skin over your port is cleaned carefully with an antiseptic and allowed to dry. ? The port is gently pinched between sterile gloves, and a needle is inserted into the port.  Only "non-coring" port needles should be used to access the port. Once the port is accessed, a blood return should be checked. This helps ensure that the port  is in the vein and is not clogged.  If your port needs to remain accessed for a constant infusion, a clear (transparent) bandage will be placed over the needle site. The bandage and needle will need to be changed every week, or as directed by your health care provider.  Keep the bandage covering the needle clean and dry. Do not get it wet. Follow your health care provider's instructions on how to take a shower or bath while the port is accessed.  If your port does not need to stay accessed, no bandage is needed over the port.  What is flushing? Flushing helps keep the port from getting clogged. Follow your health care provider's instructions on how and when to flush the port. Ports are usually flushed with saline solution or a medicine called heparin. The need for flushing will depend on how the port is used.  If the port is used for intermittent medicines or blood draws, the port will need to be flushed: ? After medicines have been given. ? After blood has been drawn. ? As part of routine maintenance.  If a constant infusion is running, the port may not need to be flushed.  How long will my port stay implanted? The port can stay in for as long as your health care provider thinks it is needed. When it is time for the port to come out, surgery will be   done to remove it. The procedure is similar to the one performed when the port was put in. When should I seek immediate medical care? When you have an implanted port, you should seek immediate medical care if:  You notice a bad smell coming from the incision site.  You have swelling, redness, or drainage at the incision site.  You have more swelling or pain at the port site or the surrounding area.  You have a fever that is not controlled with medicine.  This information is not intended to replace advice given to you by your health care provider. Make sure you discuss any questions you have with your health care provider. Document  Released: 06/26/2005 Document Revised: 12/02/2015 Document Reviewed: 03/03/2013 Elsevier Interactive Patient Education  2017 Elsevier Inc.  

## 2016-10-25 NOTE — Patient Instructions (Signed)
[facility] Discharge Instructions for Patients Receiving Chemotherapy  Today you received the following chemotherapy agents:  Taxol and Avastin.  To help prevent nausea and vomiting after your treatment, we encourage you to take your nausea medication as prescribed.   If you develop nausea and vomiting that is not controlled by your nausea medication, call the clinic.   BELOW ARE SYMPTOMS THAT SHOULD BE REPORTED IMMEDIATELY:  *FEVER GREATER THAN 100.5 F  *CHILLS WITH OR WITHOUT FEVER  NAUSEA AND VOMITING THAT IS NOT CONTROLLED WITH YOUR NAUSEA MEDICATION  *UNUSUAL SHORTNESS OF BREATH  *UNUSUAL BRUISING OR BLEEDING  TENDERNESS IN MOUTH AND THROAT WITH OR WITHOUT PRESENCE OF ULCERS  *URINARY PROBLEMS  *BOWEL PROBLEMS  UNUSUAL RASH Items with * indicate a potential emergency and should be followed up as soon as possible.  Feel free to call the clinic you have any questions or concerns. The clinic phone number is [phone].  Please show the [facility] at check-in to the Emergency Department and triage nurse.  (Avastin) Bevacizumab What is this medicine? BEVACIZUMAB (be va SIZ yoo mab) is a monoclonal antibody. It is used to treat many types of cancer. This medicine may be used for other purposes; ask your health care provider or pharmacist if you have questions. COMMON BRAND NAME(S): Avastin What should I tell my health care provider before I take this medicine? They need to know if you have any of these conditions: -diabetes -heart disease -high blood pressure -history of coughing up blood -prior anthracycline chemotherapy (e.g., doxorubicin, daunorubicin, epirubicin) -recent or ongoing radiation therapy -recent or planning to have surgery -stroke -an unusual or allergic reaction to bevacizumab, hamster proteins, mouse proteins, other medicines, foods, dyes, or preservatives -pregnant or trying to get pregnant -breast-feeding How should I use  this medicine? This medicine is for infusion into a vein. It is given by a health care professional in a hospital or clinic setting. Talk to your pediatrician regarding the use of this medicine in children. Special care may be needed. Overdosage: If you think you have taken too much of this medicine contact a poison control center or emergency room at once. NOTE: This medicine is only for you. Do not share this medicine with others. What if I miss a dose? It is important not to miss your dose. Call your doctor or health care professional if you are unable to keep an appointment. What may interact with this medicine? Interactions are not expected. This list may not describe all possible interactions. Give your health care provider a list of all the medicines, herbs, non-prescription drugs, or dietary supplements you use. Also tell them if you smoke, drink alcohol, or use illegal drugs. Some items may interact with your medicine. What should I watch for while using this medicine? Your condition will be monitored carefully while you are receiving this medicine. You will need important blood work and urine testing done while you are taking this medicine. This medicine may increase your risk to bruise or bleed. Call your doctor or health care professional if you notice any unusual bleeding. This medicine should be started at least 28 days following major surgery and the site of the surgery should be totally healed. Check with your doctor before scheduling dental work or surgery while you are receiving this treatment. Talk to your doctor if you have recently had surgery or if you have a wound that has not healed. Do not become pregnant while taking this medicine or for  6 months after stopping it. Women should inform their doctor if they wish to become pregnant or think they might be pregnant. There is a potential for serious side effects to an unborn child. Talk to your health care professional or pharmacist  for more information. Do not breast-feed an infant while taking this medicine and for 6 months after the last dose. This medicine has caused ovarian failure in some women. This medicine may interfere with the ability to have a child. You should talk to your doctor or health care professional if you are concerned about your fertility. What side effects may I notice from receiving this medicine? Side effects that you should report to your doctor or health care professional as soon as possible: -allergic reactions like skin rash, itching or hives, swelling of the face, lips, or tongue -chest pain or chest tightness -chills -coughing up blood -high fever -seizures -severe constipation -signs and symptoms of bleeding such as bloody or black, tarry stools; red or dark-brown urine; spitting up blood or brown material that looks like coffee grounds; red spots on the skin; unusual bruising or bleeding from the eye, gums, or nose -signs and symptoms of a blood clot such as breathing problems; chest pain; severe, sudden headache; pain, swelling, warmth in the leg -signs and symptoms of a stroke like changes in vision; confusion; trouble speaking or understanding; severe headaches; sudden numbness or weakness of the face, arm or leg; trouble walking; dizziness; loss of balance or coordination -stomach pain -sweating -swelling of legs or ankles -vomiting -weight gain Side effects that usually do not require medical attention (report to your doctor or health care professional if they continue or are bothersome): -back pain -changes in taste -decreased appetite -dry skin -nausea -tiredness This list may not describe all possible side effects. Call your doctor for medical advice about side effects. You may report side effects to FDA at 1-800-FDA-1088. Where should I keep my medicine? This drug is given in a hospital or clinic and will not be stored at home. NOTE: This sheet is a summary. It may not  cover all possible information. If you have questions about this medicine, talk to your doctor, pharmacist, or health care provider.  2018 Elsevier/Gold Standard (2016-06-23 14:33:29)

## 2016-10-25 NOTE — Telephone Encounter (Signed)
Gave patient AVS and calender per 4/18 los.  

## 2016-10-26 ENCOUNTER — Encounter: Payer: Self-pay | Admitting: *Deleted

## 2016-10-26 ENCOUNTER — Telehealth: Payer: Self-pay | Admitting: *Deleted

## 2016-10-26 LAB — CA 125: Cancer Antigen (CA) 125: 446.6 U/mL — ABNORMAL HIGH (ref 0.0–38.1)

## 2016-10-26 NOTE — Telephone Encounter (Signed)
Per Dr. Benay Spice patient notified of Ca 125 results and that we will continue treatment as planned at this time.  Patient anxious about results.  Emotional support given.  Patient has no questions at this time.

## 2016-10-31 ENCOUNTER — Telehealth: Payer: Self-pay | Admitting: *Deleted

## 2016-10-31 MED ORDER — MAGIC MOUTHWASH: 5.0000 mL | Freq: Four times a day (QID) | ORAL | 1 refills | Status: DC | PRN

## 2016-10-31 NOTE — Telephone Encounter (Signed)
Message from pt reporting tongue "burns" whenever she eats. She denies discrete ulcerations or white patches. She can tolerate bland foods only- nothing hot cold or spicy. Requesting prescription for MMW. Reviewed with Dr. Benay Spice: Order received for Magic Mouthwash. Sent to pharmacy. Pt notified. She reports loose stools persist. She wonders if this is related to Avastin. Pt reports she plans to try Fibercon to see if this will bulk up her stools.

## 2016-11-05 ENCOUNTER — Other Ambulatory Visit: Payer: Self-pay | Admitting: Oncology

## 2016-11-08 ENCOUNTER — Ambulatory Visit: Payer: Medicare Other

## 2016-11-08 ENCOUNTER — Other Ambulatory Visit: Payer: Self-pay | Admitting: Nurse Practitioner

## 2016-11-08 ENCOUNTER — Other Ambulatory Visit (HOSPITAL_BASED_OUTPATIENT_CLINIC_OR_DEPARTMENT_OTHER): Payer: Medicare Other

## 2016-11-08 ENCOUNTER — Ambulatory Visit (HOSPITAL_BASED_OUTPATIENT_CLINIC_OR_DEPARTMENT_OTHER): Payer: Medicare Other | Admitting: Nurse Practitioner

## 2016-11-08 ENCOUNTER — Telehealth: Payer: Self-pay | Admitting: Nurse Practitioner

## 2016-11-08 ENCOUNTER — Ambulatory Visit (HOSPITAL_BASED_OUTPATIENT_CLINIC_OR_DEPARTMENT_OTHER): Payer: Medicare Other

## 2016-11-08 VITALS — BP 161/69 | HR 87 | Temp 97.9°F | Resp 18 | Ht 61.0 in | Wt 190.1 lb

## 2016-11-08 VITALS — BP 135/52 | HR 67

## 2016-11-08 DIAGNOSIS — Z5111 Encounter for antineoplastic chemotherapy: Secondary | ICD-10-CM | POA: Diagnosis not present

## 2016-11-08 DIAGNOSIS — Z5112 Encounter for antineoplastic immunotherapy: Secondary | ICD-10-CM

## 2016-11-08 DIAGNOSIS — C7962 Secondary malignant neoplasm of left ovary: Secondary | ICD-10-CM | POA: Diagnosis not present

## 2016-11-08 DIAGNOSIS — C482 Malignant neoplasm of peritoneum, unspecified: Secondary | ICD-10-CM | POA: Diagnosis not present

## 2016-11-08 DIAGNOSIS — N390 Urinary tract infection, site not specified: Secondary | ICD-10-CM

## 2016-11-08 DIAGNOSIS — C7961 Secondary malignant neoplasm of right ovary: Secondary | ICD-10-CM

## 2016-11-08 DIAGNOSIS — C569 Malignant neoplasm of unspecified ovary: Secondary | ICD-10-CM

## 2016-11-08 DIAGNOSIS — C5701 Malignant neoplasm of right fallopian tube: Secondary | ICD-10-CM

## 2016-11-08 DIAGNOSIS — G620 Drug-induced polyneuropathy: Secondary | ICD-10-CM | POA: Diagnosis not present

## 2016-11-08 DIAGNOSIS — D6959 Other secondary thrombocytopenia: Secondary | ICD-10-CM

## 2016-11-08 DIAGNOSIS — R350 Frequency of micturition: Secondary | ICD-10-CM

## 2016-11-08 DIAGNOSIS — Z95828 Presence of other vascular implants and grafts: Secondary | ICD-10-CM

## 2016-11-08 LAB — URINALYSIS, MICROSCOPIC - CHCC
Bilirubin (Urine): NEGATIVE
Blood: NEGATIVE
GLUCOSE UR CHCC: NEGATIVE mg/dL
KETONES: NEGATIVE mg/dL
NITRITE: NEGATIVE
PH: 6 (ref 4.6–8.0)
PROTEIN: NEGATIVE mg/dL
Specific Gravity, Urine: 1.025 (ref 1.003–1.035)
Urobilinogen, UR: 0.2 mg/dL (ref 0.2–1)

## 2016-11-08 LAB — CBC WITH DIFFERENTIAL/PLATELET
BASO%: 1.2 % (ref 0.0–2.0)
Basophils Absolute: 0 10*3/uL (ref 0.0–0.1)
EOS ABS: 0.1 10*3/uL (ref 0.0–0.5)
EOS%: 3.7 % (ref 0.0–7.0)
HCT: 37.1 % (ref 34.8–46.6)
HGB: 11.9 g/dL (ref 11.6–15.9)
LYMPH%: 36.7 % (ref 14.0–49.7)
MCH: 29.6 pg (ref 25.1–34.0)
MCHC: 32.2 g/dL (ref 31.5–36.0)
MCV: 92 fL (ref 79.5–101.0)
MONO#: 0.4 10*3/uL (ref 0.1–0.9)
MONO%: 14.4 % — ABNORMAL HIGH (ref 0.0–14.0)
NEUT%: 44 % (ref 38.4–76.8)
NEUTROS ABS: 1.3 10*3/uL — AB (ref 1.5–6.5)
PLATELETS: 205 10*3/uL (ref 145–400)
RBC: 4.03 10*6/uL (ref 3.70–5.45)
RDW: 16.7 % — ABNORMAL HIGH (ref 11.2–14.5)
WBC: 2.9 10*3/uL — AB (ref 3.9–10.3)
lymph#: 1.1 10*3/uL (ref 0.9–3.3)

## 2016-11-08 LAB — COMPREHENSIVE METABOLIC PANEL
ALBUMIN: 3.2 g/dL — AB (ref 3.5–5.0)
ALK PHOS: 87 U/L (ref 40–150)
ALT: 19 U/L (ref 0–55)
ANION GAP: 6 meq/L (ref 3–11)
AST: 13 U/L (ref 5–34)
BILIRUBIN TOTAL: 0.48 mg/dL (ref 0.20–1.20)
BUN: 21.2 mg/dL (ref 7.0–26.0)
CO2: 33 meq/L — AB (ref 22–29)
Calcium: 9.2 mg/dL (ref 8.4–10.4)
Chloride: 102 mEq/L (ref 98–109)
Creatinine: 1.2 mg/dL — ABNORMAL HIGH (ref 0.6–1.1)
EGFR: 45 mL/min/{1.73_m2} — AB (ref >90)
Glucose: 116 mg/dl (ref 70–140)
Potassium: 4.5 mEq/L (ref 3.5–5.1)
Sodium: 142 mEq/L (ref 136–145)
TOTAL PROTEIN: 6.2 g/dL — AB (ref 6.4–8.3)

## 2016-11-08 LAB — UA PROTEIN, DIPSTICK - CHCC: Protein, ur: NEGATIVE mg/dL

## 2016-11-08 MED ORDER — PACLITAXEL CHEMO INJECTION 300 MG/50ML
80.0000 mg/m2 | Freq: Once | INTRAVENOUS | Status: AC
  Administered 2016-11-08: 150 mg via INTRAVENOUS
  Filled 2016-11-08: qty 25

## 2016-11-08 MED ORDER — HEPARIN SOD (PORK) LOCK FLUSH 100 UNIT/ML IV SOLN
500.0000 [IU] | Freq: Once | INTRAVENOUS | Status: AC | PRN
  Administered 2016-11-08: 500 [IU]
  Filled 2016-11-08: qty 5

## 2016-11-08 MED ORDER — DEXAMETHASONE SODIUM PHOSPHATE 10 MG/ML IJ SOLN
5.0000 mg | Freq: Once | INTRAMUSCULAR | Status: AC
  Administered 2016-11-08: 5 mg via INTRAVENOUS

## 2016-11-08 MED ORDER — SODIUM CHLORIDE 0.9 % IV SOLN
Freq: Once | INTRAVENOUS | Status: AC
  Administered 2016-11-08: 14:00:00 via INTRAVENOUS

## 2016-11-08 MED ORDER — CIPROFLOXACIN HCL 250 MG PO TABS: 250.0000 mg | ORAL_TABLET | Freq: Two times a day (BID) | ORAL | 0 refills | Status: DC

## 2016-11-08 MED ORDER — SODIUM CHLORIDE 0.9% FLUSH
10.0000 mL | INTRAVENOUS | Status: DC | PRN
  Administered 2016-11-08: 10 mL
  Filled 2016-11-08: qty 10

## 2016-11-08 MED ORDER — SODIUM CHLORIDE 0.9 % IJ SOLN
10.0000 mL | INTRAMUSCULAR | Status: DC | PRN
  Administered 2016-11-08: 10 mL via INTRAVENOUS
  Filled 2016-11-08: qty 10

## 2016-11-08 MED ORDER — SODIUM CHLORIDE 0.9 % IV SOLN
10.5000 mg/kg | Freq: Once | INTRAVENOUS | Status: AC
  Administered 2016-11-08: 900 mg via INTRAVENOUS
  Filled 2016-11-08: qty 32

## 2016-11-08 MED ORDER — DIPHENHYDRAMINE HCL 50 MG/ML IJ SOLN
25.0000 mg | Freq: Once | INTRAMUSCULAR | Status: AC
  Administered 2016-11-08: 25 mg via INTRAVENOUS

## 2016-11-08 MED ORDER — DIPHENHYDRAMINE HCL 50 MG/ML IJ SOLN
INTRAMUSCULAR | Status: AC
  Filled 2016-11-08: qty 1

## 2016-11-08 MED ORDER — FAMOTIDINE IN NACL 20-0.9 MG/50ML-% IV SOLN
INTRAVENOUS | Status: AC
  Filled 2016-11-08: qty 50

## 2016-11-08 MED ORDER — FAMOTIDINE IN NACL 20-0.9 MG/50ML-% IV SOLN
20.0000 mg | Freq: Once | INTRAVENOUS | Status: AC
  Administered 2016-11-08: 20 mg via INTRAVENOUS

## 2016-11-08 MED ORDER — DEXAMETHASONE SODIUM PHOSPHATE 10 MG/ML IJ SOLN
INTRAMUSCULAR | Status: AC
  Filled 2016-11-08: qty 1

## 2016-11-08 NOTE — Progress Notes (Signed)
[place] OFFICE PROGRESS NOTE   Diagnosis:  Ovarian cancer  INTERVAL HISTORY:   Ms. Suppa returns as scheduled. She completed cycle 3 Taxol/Avastin 10/25/2016. She has mild intermittent nausea. No vomiting. She notes tongue soreness. No ulcerations. For 2-3 days after the chemotherapy she has increased pain in her feet. The pain subsequently returns to baseline. She continues to note blood with nose blowing. She has mild shortness of breath. She continues to have a cough. She has noted hoarseness which worsens after going outside. She wonders if some of her symptoms are related to seasonal allergies. No fever. She had significant improvement in the pain following recent injections. The pain has recurred somewhat. She has follow-up with orthopedics. She has noted recent urinary frequency. At times she is not able to make it to the bathroom. No dysuria. No fever.  Objective:  Vital signs in last 24 hours:  Blood pressure (!) 161/69, pulse 87, temperature 97.9 F (36.6 C), temperature source Oral, resp. rate 18, height 5\' 1"  (1.549 m), weight 190 lb 1.6 oz (86.2 kg), SpO2 94 %.    HEENT: No thrush or ulcers.  Resp: Distant breath sounds. No respiratory distress. Cardio: Regular rate and rhythm. GI: Abdomen is soft. No hepatomegaly. Tenderness at the right upper abdomen. Vascular: No leg edema. Port-A-Cath without erythema.  Lab Results:  Lab Results  Component Value Date   WBC 2.9 (L) 11/08/2016   HGB 11.9 11/08/2016   HCT 37.1 11/08/2016   MCV 92.0 11/08/2016   PLT 205 11/08/2016   NEUTROABS 1.3 (L) 11/08/2016    Imaging:  No results found.  Medications: I have reviewed the patient's current medications.  Assessment/Plan: 1. 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 Significant improvement in malignant right pleural effusion.  Status post robotic-assisted laparoscopic hysterectomy with bilateral salpingoophorectomy, omentectomy, radical tumor debulking 12/29/2014. Per Dr. Serita Grit office note 01/14/2015 cytoreduction was optimal with residual disease remaining only in the 1 mm implants on the small intestine. 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 Cycle 1 adjuvant Taxol/carboplatin 01/20/2015 Cycle 2 adjuvant Taxol/carboplatin 02/10/2015 Cycle 3 adjuvant Taxol/carboplatin 03/10/2015 CA 125 on 1013 2016-42  CTs of the chest, abdomen, and pelvis 05/31/2015 with no evidence of progressive ovarian cancer  CTs of the chest, abdomen, and pelvis 08/07/2015 and 08/08/2015-no evidence of progressive ovarian cancer  Peritoneal studding noted at the time of the cholecystectomy procedure 08/09/2015 Elevated CA 125 10/07/2015  CT 10/07/2015 with  stricturing at the sigmoid colon, constipation, and omental nodularity  Initiation of salvage weekly Taxol chemotherapy 10/13/2015  Taxol changed to every 2 weeks beginning 01/19/2016 due to painful neuropathy.  CT scans 07/19/2016 with no acute findings. No features in the abdomen or pelvis to suggest recurrent  disease.Stable mild fullness right intrarenal collecting system.  Elevated CA 125 treatment resumed with Taxol/Avastin on a 2 week schedule 09/27/2016 2. COPD-followed by Dr. Lamonte Sakai. 3. Dyspnea secondary to COPD and the large right pleural effusion, status post therapeutic thoracentesis procedures 09/30/2014,10/09/2014, and 10/21/2014. Left thoracentesis 10/30/2014 4. Left nephrectomy as a child 5. Delayed nausea following cycle 1 Taxol/carboplatin, Aloxi/Emend added with cycle 2 with improvement. 6. Right lower extremity edema, right calf pain 12/09/2014. Negative venous Doppler 12/09/2014. 7. Diffuse pruritus following cycle 1 adjuvant Taxol/carboplatin 01/20/2015, no rash, resolved with a steroid dose pack 8. Thrombocytopenia second to chemotherapy, the carboplatin was dose reduced with cycle 2 adjuvant Taxol/carboplatin 02/10/2015 9. Chemotherapy-induced peripheral neuropathy-painful peripheral neuropathy involving the feet 01/19/2016.  10. Admission with acute cholecystitis 08/07/2015, status post a cholecystectomy 08/09/2015 11. Admission 10/08/2015 with abdominal pain/constipation, improved with bowel rest and laxatives 12. Mild right hydronephrosis noted on the CT 10/07/2015. Renal ultrasound 12/16/2015 with no hydronephrosis noted.No hydronephrosis on CT 07/19/2016.    Disposition: Lynn Ramirez appears stable. She has completed 3 cycles of Taxol/Avastin. Plan to proceed with cycle 4 today as scheduled.  We reviewed today's labs. The neutrophil count is mildly decreased. She understands to contact the office with signs of infection.  We will obtain a urinalysis to  evaluate the urinary frequency.  She will return for a follow-up visit and chemotherapy in 2 weeks. She will contact the office in the interim with any problems.  Plan reviewed with Dr. Benay Spice.    Ned Card ANP/GNP-BC   11/08/2016  12:35 PM

## 2016-11-08 NOTE — Telephone Encounter (Signed)
Gave patient AVS and calender per 5/2 los.  

## 2016-11-08 NOTE — Patient Instructions (Signed)
[facility] Discharge Instructions for Patients Receiving Chemotherapy  Today you received the following chemotherapy agents:  Taxol and Avastin.  To help prevent nausea and vomiting after your treatment, we encourage you to take your nausea medication as prescribed.   If you develop nausea and vomiting that is not controlled by your nausea medication, call the clinic.   BELOW ARE SYMPTOMS THAT SHOULD BE REPORTED IMMEDIATELY:  *FEVER GREATER THAN 100.5 F  *CHILLS WITH OR WITHOUT FEVER  NAUSEA AND VOMITING THAT IS NOT CONTROLLED WITH YOUR NAUSEA MEDICATION  *UNUSUAL SHORTNESS OF BREATH  *UNUSUAL BRUISING OR BLEEDING  TENDERNESS IN MOUTH AND THROAT WITH OR WITHOUT PRESENCE OF ULCERS  *URINARY PROBLEMS  *BOWEL PROBLEMS  UNUSUAL RASH Items with * indicate a potential emergency and should be followed up as soon as possible.  Feel free to call the clinic you have any questions or concerns. The clinic phone number is [phone].  Please show the [place] at check-in to the Emergency Department and triage nurse.  (Avastin) Bevacizumab What is this medicine? BEVACIZUMAB (be va SIZ yoo mab) is a monoclonal antibody. It is used to treat many types of cancer. This medicine may be used for other purposes; ask your health care provider or pharmacist if you have questions. COMMON BRAND NAME(S): Avastin What should I tell my health care provider before I take this medicine? They need to know if you have any of these conditions: -diabetes -heart disease -high blood pressure -history of coughing up blood -prior anthracycline chemotherapy (e.g., doxorubicin, daunorubicin, epirubicin) -recent or ongoing radiation therapy -recent or planning to have surgery -stroke -an unusual or allergic reaction to bevacizumab, hamster proteins, mouse proteins, other medicines, foods, dyes, or preservatives -pregnant or trying to get pregnant -breast-feeding How should I use  this medicine? This medicine is for infusion into a vein. It is given by a health care professional in a hospital or clinic setting. Talk to your pediatrician regarding the use of this medicine in children. Special care may be needed. Overdosage: If you think you have taken too much of this medicine contact a poison control center or emergency room at once. NOTE: This medicine is only for you. Do not share this medicine with others. What if I miss a dose? It is important not to miss your dose. Call your doctor or health care professional if you are unable to keep an appointment. What may interact with this medicine? Interactions are not expected. This list may not describe all possible interactions. Give your health care provider a list of all the medicines, herbs, non-prescription drugs, or dietary supplements you use. Also tell them if you smoke, drink alcohol, or use illegal drugs. Some items may interact with your medicine. What should I watch for while using this medicine? Your condition will be monitored carefully while you are receiving this medicine. You will need important blood work and urine testing done while you are taking this medicine. This medicine may increase your risk to bruise or bleed. Call your doctor or health care professional if you notice any unusual bleeding. This medicine should be started at least 28 days following major surgery and the site of the surgery should be totally healed. Check with your doctor before scheduling dental work or surgery while you are receiving this treatment. Talk to your doctor if you have recently had surgery or if you have a wound that has not healed. Do not become pregnant while taking this medicine or for  6 months after stopping it. Women should inform their doctor if they wish to become pregnant or think they might be pregnant. There is a potential for serious side effects to an unborn child. Talk to your health care professional or pharmacist  for more information. Do not breast-feed an infant while taking this medicine and for 6 months after the last dose. This medicine has caused ovarian failure in some women. This medicine may interfere with the ability to have a child. You should talk to your doctor or health care professional if you are concerned about your fertility. What side effects may I notice from receiving this medicine? Side effects that you should report to your doctor or health care professional as soon as possible: -allergic reactions like skin rash, itching or hives, swelling of the face, lips, or tongue -chest pain or chest tightness -chills -coughing up blood -high fever -seizures -severe constipation -signs and symptoms of bleeding such as bloody or black, tarry stools; red or dark-brown urine; spitting up blood or brown material that looks like coffee grounds; red spots on the skin; unusual bruising or bleeding from the eye, gums, or nose -signs and symptoms of a blood clot such as breathing problems; chest pain; severe, sudden headache; pain, swelling, warmth in the leg -signs and symptoms of a stroke like changes in vision; confusion; trouble speaking or understanding; severe headaches; sudden numbness or weakness of the face, arm or leg; trouble walking; dizziness; loss of balance or coordination -stomach pain -sweating -swelling of legs or ankles -vomiting -weight gain Side effects that usually do not require medical attention (report to your doctor or health care professional if they continue or are bothersome): -back pain -changes in taste -decreased appetite -dry skin -nausea -tiredness This list may not describe all possible side effects. Call your doctor for medical advice about side effects. You may report side effects to FDA at 1-800-FDA-1088. Where should I keep my medicine? This drug is given in a hospital or clinic and will not be stored at home. NOTE: This sheet is a summary. It may not  cover all possible information. If you have questions about this medicine, talk to your doctor, pharmacist, or health care provider.  2018 Elsevier/Gold Standard (2016-06-23 14:33:29)

## 2016-11-08 NOTE — Progress Notes (Signed)
OK to treat per Lattie Haw NP for ANC 1.3

## 2016-11-09 ENCOUNTER — Telehealth: Payer: Self-pay | Admitting: *Deleted

## 2016-11-09 LAB — CA 125: CANCER ANTIGEN (CA) 125: 344.4 U/mL — AB (ref 0.0–38.1)

## 2016-11-09 NOTE — Telephone Encounter (Signed)
Call from pt requesting CA125 result. Result given. Pt was pleased to hear it came down.

## 2016-11-09 NOTE — Telephone Encounter (Signed)
-----   Message from [name], NP sent at 11/09/2016 12:01 PM EDT ----- Please let her know the CA-125 is better at 344

## 2016-11-09 NOTE — Telephone Encounter (Signed)
Telephone call to patient- advised lab results. Pt verbalized an understanding and thanked me for the call. Pt advised to call this office with any questions or concerns.

## 2016-11-10 ENCOUNTER — Other Ambulatory Visit: Payer: Self-pay | Admitting: Cardiology

## 2016-11-10 DIAGNOSIS — E785 Hyperlipidemia, unspecified: Secondary | ICD-10-CM

## 2016-11-10 DIAGNOSIS — I25119 Atherosclerotic heart disease of native coronary artery with unspecified angina pectoris: Secondary | ICD-10-CM

## 2016-11-10 LAB — URINE CULTURE

## 2016-11-14 ENCOUNTER — Other Ambulatory Visit: Payer: Self-pay | Admitting: *Deleted

## 2016-11-14 ENCOUNTER — Ambulatory Visit: Payer: Medicare Other | Admitting: Emergency Medicine

## 2016-11-14 DIAGNOSIS — L570 Actinic keratosis: Secondary | ICD-10-CM | POA: Diagnosis not present

## 2016-11-14 DIAGNOSIS — D692 Other nonthrombocytopenic purpura: Secondary | ICD-10-CM | POA: Diagnosis not present

## 2016-11-14 DIAGNOSIS — L821 Other seborrheic keratosis: Secondary | ICD-10-CM | POA: Diagnosis not present

## 2016-11-14 DIAGNOSIS — D1801 Hemangioma of skin and subcutaneous tissue: Secondary | ICD-10-CM | POA: Diagnosis not present

## 2016-11-14 DIAGNOSIS — C482 Malignant neoplasm of peritoneum, unspecified: Secondary | ICD-10-CM

## 2016-11-14 MED ORDER — DIPHENOXYLATE-ATROPINE 2.5-0.025 MG PO TABS: ORAL_TABLET | ORAL | 0 refills | Status: DC

## 2016-11-19 ENCOUNTER — Other Ambulatory Visit: Payer: Self-pay | Admitting: Oncology

## 2016-11-20 DIAGNOSIS — M1711 Unilateral primary osteoarthritis, right knee: Secondary | ICD-10-CM | POA: Diagnosis not present

## 2016-11-20 DIAGNOSIS — M1712 Unilateral primary osteoarthritis, left knee: Secondary | ICD-10-CM | POA: Diagnosis not present

## 2016-11-20 DIAGNOSIS — M170 Bilateral primary osteoarthritis of knee: Secondary | ICD-10-CM | POA: Diagnosis not present

## 2016-11-22 ENCOUNTER — Ambulatory Visit (HOSPITAL_BASED_OUTPATIENT_CLINIC_OR_DEPARTMENT_OTHER): Payer: Medicare Other | Admitting: Oncology

## 2016-11-22 ENCOUNTER — Ambulatory Visit: Payer: Medicare Other

## 2016-11-22 ENCOUNTER — Other Ambulatory Visit (HOSPITAL_BASED_OUTPATIENT_CLINIC_OR_DEPARTMENT_OTHER): Payer: Medicare Other

## 2016-11-22 ENCOUNTER — Ambulatory Visit (HOSPITAL_BASED_OUTPATIENT_CLINIC_OR_DEPARTMENT_OTHER): Payer: Medicare Other

## 2016-11-22 ENCOUNTER — Telehealth: Payer: Self-pay | Admitting: Oncology

## 2016-11-22 VITALS — BP 139/52

## 2016-11-22 VITALS — BP 164/83 | HR 87 | Temp 97.8°F | Resp 18 | Ht 61.0 in | Wt 189.1 lb

## 2016-11-22 DIAGNOSIS — C482 Malignant neoplasm of peritoneum, unspecified: Secondary | ICD-10-CM | POA: Diagnosis not present

## 2016-11-22 DIAGNOSIS — Z5112 Encounter for antineoplastic immunotherapy: Secondary | ICD-10-CM | POA: Diagnosis not present

## 2016-11-22 DIAGNOSIS — C5701 Malignant neoplasm of right fallopian tube: Secondary | ICD-10-CM | POA: Diagnosis not present

## 2016-11-22 DIAGNOSIS — C569 Malignant neoplasm of unspecified ovary: Secondary | ICD-10-CM

## 2016-11-22 DIAGNOSIS — Z5111 Encounter for antineoplastic chemotherapy: Secondary | ICD-10-CM

## 2016-11-22 DIAGNOSIS — C7961 Secondary malignant neoplasm of right ovary: Secondary | ICD-10-CM

## 2016-11-22 DIAGNOSIS — C7962 Secondary malignant neoplasm of left ovary: Secondary | ICD-10-CM

## 2016-11-22 DIAGNOSIS — Z95828 Presence of other vascular implants and grafts: Secondary | ICD-10-CM

## 2016-11-22 LAB — COMPREHENSIVE METABOLIC PANEL
ALBUMIN: 3.5 g/dL (ref 3.5–5.0)
ALT: 19 U/L (ref 0–55)
AST: 15 U/L (ref 5–34)
Alkaline Phosphatase: 77 U/L (ref 40–150)
Anion Gap: 9 mEq/L (ref 3–11)
BILIRUBIN TOTAL: 0.39 mg/dL (ref 0.20–1.20)
BUN: 21.8 mg/dL (ref 7.0–26.0)
CO2: 30 mEq/L — ABNORMAL HIGH (ref 22–29)
Calcium: 9.7 mg/dL (ref 8.4–10.4)
Chloride: 102 mEq/L (ref 98–109)
Creatinine: 0.9 mg/dL (ref 0.6–1.1)
EGFR: 61 mL/min/{1.73_m2} — AB (ref >90)
GLUCOSE: 100 mg/dL (ref 70–140)
Potassium: 4 mEq/L (ref 3.5–5.1)
SODIUM: 140 meq/L (ref 136–145)
TOTAL PROTEIN: 6.7 g/dL (ref 6.4–8.3)

## 2016-11-22 LAB — CBC WITH DIFFERENTIAL/PLATELET
BASO%: 1.2 % (ref 0.0–2.0)
Basophils Absolute: 0 10*3/uL (ref 0.0–0.1)
EOS ABS: 0.1 10*3/uL (ref 0.0–0.5)
EOS%: 1.7 % (ref 0.0–7.0)
HEMATOCRIT: 36.2 % (ref 34.8–46.6)
HEMOGLOBIN: 12.1 g/dL (ref 11.6–15.9)
LYMPH#: 1.1 10*3/uL (ref 0.9–3.3)
LYMPH%: 30 % (ref 14.0–49.7)
MCH: 30.6 pg (ref 25.1–34.0)
MCHC: 33.3 g/dL (ref 31.5–36.0)
MCV: 91.8 fL (ref 79.5–101.0)
MONO#: 0.4 10*3/uL (ref 0.1–0.9)
MONO%: 12.2 % (ref 0.0–14.0)
NEUT%: 54.9 % (ref 38.4–76.8)
NEUTROS ABS: 1.9 10*3/uL (ref 1.5–6.5)
PLATELETS: 219 10*3/uL (ref 145–400)
RBC: 3.95 10*6/uL (ref 3.70–5.45)
RDW: 17.2 % — AB (ref 11.2–14.5)
WBC: 3.5 10*3/uL — AB (ref 3.9–10.3)

## 2016-11-22 LAB — UA PROTEIN, DIPSTICK - CHCC: Protein, ur: NEGATIVE mg/dL

## 2016-11-22 MED ORDER — PACLITAXEL CHEMO INJECTION 300 MG/50ML
80.0000 mg/m2 | Freq: Once | INTRAVENOUS | Status: AC
  Administered 2016-11-22: 150 mg via INTRAVENOUS
  Filled 2016-11-22: qty 25

## 2016-11-22 MED ORDER — FAMOTIDINE IN NACL 20-0.9 MG/50ML-% IV SOLN
20.0000 mg | Freq: Once | INTRAVENOUS | Status: AC
  Administered 2016-11-22: 20 mg via INTRAVENOUS

## 2016-11-22 MED ORDER — HEPARIN SOD (PORK) LOCK FLUSH 100 UNIT/ML IV SOLN
500.0000 [IU] | Freq: Once | INTRAVENOUS | Status: AC | PRN
  Administered 2016-11-22: 500 [IU]
  Filled 2016-11-22: qty 5

## 2016-11-22 MED ORDER — DEXAMETHASONE SODIUM PHOSPHATE 10 MG/ML IJ SOLN
5.0000 mg | Freq: Once | INTRAMUSCULAR | Status: AC
  Administered 2016-11-22: 5 mg via INTRAVENOUS

## 2016-11-22 MED ORDER — DEXAMETHASONE SODIUM PHOSPHATE 10 MG/ML IJ SOLN
INTRAMUSCULAR | Status: AC
  Filled 2016-11-22: qty 1

## 2016-11-22 MED ORDER — DIPHENHYDRAMINE HCL 50 MG/ML IJ SOLN
INTRAMUSCULAR | Status: AC
  Filled 2016-11-22: qty 1

## 2016-11-22 MED ORDER — SODIUM CHLORIDE 0.9 % IV SOLN
10.4000 mg/kg | Freq: Once | INTRAVENOUS | Status: AC
  Administered 2016-11-22: 900 mg via INTRAVENOUS
  Filled 2016-11-22: qty 32

## 2016-11-22 MED ORDER — SODIUM CHLORIDE 0.9 % IV SOLN
Freq: Once | INTRAVENOUS | Status: AC
  Administered 2016-11-22: 11:00:00 via INTRAVENOUS

## 2016-11-22 MED ORDER — DIPHENHYDRAMINE HCL 50 MG/ML IJ SOLN
25.0000 mg | Freq: Once | INTRAMUSCULAR | Status: AC
  Administered 2016-11-22: 25 mg via INTRAVENOUS

## 2016-11-22 MED ORDER — FAMOTIDINE IN NACL 20-0.9 MG/50ML-% IV SOLN
INTRAVENOUS | Status: AC
  Filled 2016-11-22: qty 50

## 2016-11-22 MED ORDER — SODIUM CHLORIDE 0.9% FLUSH
10.0000 mL | INTRAVENOUS | Status: DC | PRN
  Administered 2016-11-22: 10 mL
  Filled 2016-11-22: qty 10

## 2016-11-22 MED ORDER — SODIUM CHLORIDE 0.9 % IJ SOLN
10.0000 mL | INTRAMUSCULAR | Status: DC | PRN
  Administered 2016-11-22: 10 mL via INTRAVENOUS
  Filled 2016-11-22: qty 10

## 2016-11-22 NOTE — Patient Instructions (Signed)

## 2016-11-22 NOTE — Patient Instructions (Signed)
[facility] Discharge Instructions for Patients Receiving Chemotherapy  Today you received the following chemotherapy agents:  Taxol  To help prevent nausea and vomiting after your treatment, we encourage you to take your nausea medication as prescribed.   If you develop nausea and vomiting that is not controlled by your nausea medication, call the clinic.   BELOW ARE SYMPTOMS THAT SHOULD BE REPORTED IMMEDIATELY:  *FEVER GREATER THAN 100.5 F  *CHILLS WITH OR WITHOUT FEVER  NAUSEA AND VOMITING THAT IS NOT CONTROLLED WITH YOUR NAUSEA MEDICATION  *UNUSUAL SHORTNESS OF BREATH  *UNUSUAL BRUISING OR BLEEDING  TENDERNESS IN MOUTH AND THROAT WITH OR WITHOUT PRESENCE OF ULCERS  *URINARY PROBLEMS  *BOWEL PROBLEMS  UNUSUAL RASH Items with * indicate a potential emergency and should be followed up as soon as possible.  Feel free to call the clinic you have any questions or concerns. The clinic phone number is [phone].  Please show the CHEMO ALERT CARD at check-in to the Emergency Department and triage nurse.   

## 2016-11-22 NOTE — Telephone Encounter (Signed)
Gave patient AVS and calender per 5/16 los.  

## 2016-11-22 NOTE — Progress Notes (Signed)
[place] OFFICE PROGRESS NOTE   Diagnosis: Ovarian cancer  INTERVAL HISTORY:   Mr. Murphey returns as scheduled. She completed another treatment with Taxol/Avastin on 11/08/2016. No change in neuropathy symptoms. She has mild nose bleeding in the mornings. No other bleeding or symptom of thrombosis. The diarrhea has resolved. She has persistent exertional dyspnea. Objective:  Vital signs in last 24 hours:  Blood pressure (!) 164/83, pulse 87, temperature 97.8 F (36.6 C), temperature source Oral, resp. rate 18, height 5\' 1"  (1.549 m), weight 189 lb 1.6 oz (85.8 kg), SpO2 94 %.    HEENT: No thrush Resp: Distant breath sounds, no respiratory distress, symmetric air movement Cardio: Regular rate and rhythm GI: No mass, no hepatosplenomegaly, no apparent ascites Vascular: No leg edema   Portacath/PICC-without erythema  Lab Results:  Lab Results  Component Value Date   WBC 3.5 (L) 11/22/2016   HGB 12.1 11/22/2016   HCT 36.2 11/22/2016   MCV 91.8 11/22/2016   PLT 219 11/22/2016   NEUTROABS 1.9 11/22/2016    CMP     Component Value Date/Time   NA 140 11/22/2016 0907   K 4.0 11/22/2016 0907   CL 105 10/18/2016 0336   CO2 30 (H) 11/22/2016 0907   GLUCOSE 100 11/22/2016 0907   BUN 21.8 11/22/2016 0907   CREATININE 0.9 11/22/2016 0907   CALCIUM 9.7 11/22/2016 0907   PROT 6.7 11/22/2016 0907   ALBUMIN 3.5 11/22/2016 0907   AST 15 11/22/2016 0907   ALT 19 11/22/2016 0907   ALKPHOS 77 11/22/2016 0907   BILITOT 0.39 11/22/2016 0907   GFRNONAA 52 (L) 10/18/2016 0336   GFRAA >60 10/18/2016 0336    Medications: I have reviewed the patient's current medications.  Assessment/Plan: 1. 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 Significant improvement in malignant right pleural effusion.  Status post robotic-assisted laparoscopic hysterectomy with bilateral salpingoophorectomy, omentectomy, radical tumor debulking 12/29/2014. Per Dr. Serita Grit office note 01/14/2015 cytoreduction was optimal with residual disease remaining only in the 1 mm implants on the small intestine. 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 Cycle 1 adjuvant Taxol/carboplatin 01/20/2015 Cycle 2 adjuvant Taxol/carboplatin 02/10/2015 Cycle 3 adjuvant Taxol/carboplatin 03/10/2015 CA 125 on 1013 2016-42  CTs of the chest, abdomen, and pelvis 05/31/2015 with no evidence of progressive ovarian cancer  CTs of the chest, abdomen, and pelvis 08/07/2015 and 08/08/2015-no evidence of progressive ovarian cancer  Peritoneal studding noted at the time of the cholecystectomy procedure 08/09/2015 Elevated CA 125 10/07/2015  CT 10/07/2015 with  stricturing at the sigmoid colon, constipation, and omental nodularity  Initiation of salvage weekly Taxol chemotherapy 10/13/2015  Taxol changed to every 2 weeks beginning 01/19/2016 due to painful neuropathy.  CT scans 07/19/2016 with no acute  findings. No features in the abdomen or pelvis to suggest recurrent disease.Stable mild fullness right intrarenal collecting system.  Elevated CA 125 treatment resumed with Taxol/Avastin on a 2 week schedule 09/27/2016 2. COPD-followed by Dr. Lamonte Sakai. 3. Dyspnea secondary to COPD and the large right pleural effusion, status post therapeutic thoracentesis procedures 09/30/2014,10/09/2014, and 10/21/2014. Left thoracentesis 10/30/2014 4. Left nephrectomy as a child 5. Delayed nausea following cycle 1 Taxol/carboplatin, Aloxi/Emend added with cycle 2 with improvement. 6. Right lower extremity edema, right calf pain 12/09/2014. Negative venous Doppler 12/09/2014. 7. Diffuse pruritus following cycle 1 adjuvant Taxol/carboplatin 01/20/2015, no rash, resolved with a steroid dose pack 8. Thrombocytopenia second to chemotherapy, the carboplatin was dose reduced with cycle 2 adjuvant Taxol/carboplatin 02/10/2015 9. Chemotherapy-induced peripheral neuropathy-painful peripheral neuropathy involving the feet 01/19/2016.  10. Admission with acute cholecystitis 08/07/2015, status post a cholecystectomy 08/09/2015 11. Admission 10/08/2015 with abdominal pain/constipation, improved with bowel rest and laxatives 12. Mild right hydronephrosis noted on the CT 10/07/2015. Renal ultrasound 12/16/2015 with no hydronephrosis noted.No hydronephrosis on CT 07/19/2016.  Disposition:  She appears unchanged. She is tolerating the Taxol/Avastin well. The CA-125 was slightly improved 2 weeks ago. We will follow-up on the CA 125 from today. She will return for an office visit and chemotherapy in 2 weeks.   Betsy Coder, MD  11/22/2016  10:42 AM

## 2016-11-23 ENCOUNTER — Encounter: Payer: Self-pay | Admitting: Adult Health

## 2016-11-23 ENCOUNTER — Ambulatory Visit (INDEPENDENT_AMBULATORY_CARE_PROVIDER_SITE_OTHER): Payer: Medicare Other | Admitting: Adult Health

## 2016-11-23 ENCOUNTER — Ambulatory Visit (INDEPENDENT_AMBULATORY_CARE_PROVIDER_SITE_OTHER)
Admission: RE | Admit: 2016-11-23 | Discharge: 2016-11-23 | Disposition: A | Discharge status: Home / Self Care | Payer: Medicare Other | Source: Ambulatory Visit | Attending: Adult Health | Admitting: Adult Health

## 2016-11-23 DIAGNOSIS — I2510 Atherosclerotic heart disease of native coronary artery without angina pectoris: Secondary | ICD-10-CM

## 2016-11-23 DIAGNOSIS — R05 Cough: Secondary | ICD-10-CM | POA: Diagnosis not present

## 2016-11-23 DIAGNOSIS — I2583 Coronary atherosclerosis due to lipid rich plaque: Secondary | ICD-10-CM | POA: Diagnosis not present

## 2016-11-23 DIAGNOSIS — J441 Chronic obstructive pulmonary disease with (acute) exacerbation: Secondary | ICD-10-CM

## 2016-11-23 LAB — CA 125: CANCER ANTIGEN (CA) 125: 265.3 U/mL — AB (ref 0.0–38.1)

## 2016-11-23 MED ORDER — AZITHROMYCIN 250 MG PO TABS: ORAL_TABLET | ORAL | 0 refills | Status: AC

## 2016-11-23 MED ORDER — PREDNISONE 10 MG PO TABS: ORAL_TABLET | ORAL | 0 refills | Status: DC

## 2016-11-23 NOTE — Addendum Note (Signed)
Addended by: Parke Poisson E on: 11/23/2016 04:52 PM   Modules accepted: Orders

## 2016-11-23 NOTE — Assessment & Plan Note (Signed)
Exacerbation -immunosuppressed pt on chemo  Check CXR   Plan  Patient Instructions  Chest xray today .  ZPack take as directed.  Prednisone taper over next .  Mucinex DM Twice daily  As needed  Cough/congestion .  Continue on Stiolto 1 puff daily  Follow up with Dr. Lamonte Sakai  In 6 weeks and As needed   Please contact office for sooner follow up if symptoms do not improve or worsen or seek emergency care

## 2016-11-23 NOTE — Progress Notes (Signed)
@Patient  ID: Lynn Ramirez, female    DOB: [birth year], 76 y.o.   MRN: [id number]  Chief Complaint  Patient presents with  . Acute Visit    cough     Referring provider: Midge Minium, MD  HPI: 76 year old female, former smoker followed for COPD, malignant pleural effusions in the setting of chemotherapy for ovarian cancer  11/23/2016 Acute OV  Pt presents for an acute office visit. Complains of 1 month of cough , mainly dry . Today feels more sob with increased cough . Rare wheezing .  Had COPD flare 2 months ago, seen in ER given steroid taper.  CXR 4/11 w/ increased interstitial marking . BNP was ok . Troponin neg.  Echo 10/17/16 EF 55-60% , PAP 29., gr 1 DD . Plans for serial Echo while on Avastin.  Patient has known metastatic ovarian cancer . She is followed by Dr. Benay Spice , on Taxol Margarette Asal (last tx 11/1616) . Good appetite , no nv/d.  Continues on [employer]. .  She denies any chest pain, hemoptysis, orthopnea, PND, or fever.     Allergies  Allergen Reactions  . Latex Rash    Latex gloves ONLY (pt cannot wear them- no reaction if someone else touches her with latex gloves on)  . Penicillins Other (See Comments)    Has patient had a PCN reaction causing immediate rash, facial/tongue/throat swelling, SOB or lightheadedness with hypotension: Yes  Has patient had a PCN reaction causing severe rash involving mucus membranes or skin necrosis:No Has patient had a PCN reaction that required hospitalization:No Has patient had a PCN reaction occurring within the last 10 years:No If all of the above answers are "NO", then may proceed with Cephalosporin use. welps    Immunization History  Administered Date(s) Administered  . Influenza Split 03/26/2014  . Influenza Whole 04/09/2012  . Influenza, High Dose Seasonal PF 03/10/2013  . Influenza,inj,Quad PF,36+ Mos 05/06/2015, 04/12/2016  . Pneumococcal Conjugate-13 05/06/2015  . Pneumococcal Polysaccharide-23 05/17/2016     Past Medical History:  Diagnosis Date  . Cancer ([place])    ovarian  . COPD (chronic obstructive pulmonary disease) ([place])   . Difficulty sleeping   . Eczema    hands  . Emphysema   . GERD (gastroesophageal reflux disease)   . H/O hydronephrosis   . History of transfusion    age 12  . Hypertension     Tobacco History: History  Smoking Status  . Former Smoker  . Packs/day: 1.00  . Years: 40.00  . Types: Cigarettes  . Quit date: 07/10/2013  Smokeless Tobacco  . Never Used   Counseling given: Not Answered   Outpatient Encounter Prescriptions as of 11/23/2016  Medication Sig  . Acetaminophen (TYLENOL) 325 MG CAPS Take 1 tablet by mouth 3 (three) times daily as needed (pain).   Marland Kitchen antiseptic oral rinse (BIOTENE) LIQD 15 mLs by Mouth Rinse route 3 (three) times daily.  Marland Kitchen atorvastatin (LIPITOR) 10 MG tablet TAKE 1 TABLET(10 MG) BY MOUTH DAILY  . Biotin 2500 MCG CAPS Take 1 tablet by mouth daily.  . carvedilol (COREG) 3.125 MG tablet Take 1 tablet (3.125 mg total) by mouth 2 (two) times daily with a meal.  . cholecalciferol (VITAMIN D) 1000 units tablet Take 1,000 Units by mouth daily.  . diphenoxylate-atropine (LOMOTIL) 2.5-0.025 MG tablet TAKE 2 TABLETS BY MOUTH FOUR TIMES DAILY AS NEEDED FOR DIARRHEA OR LOOSE STOOLS  . docusate sodium (COLACE) 100 MG capsule Take 1 capsule (100 mg total) by  mouth 2 (two) times daily.  . famotidine (PEPCID) 10 MG tablet Take 20 mg by mouth daily.   . fluocinonide cream (LIDEX) 0.63 % Apply 1 application topically 2 (two) times daily as needed (For ezcema).   . gabapentin (NEURONTIN) 300 MG capsule Take 300 mg by mouth at bedtime.  . lidocaine-prilocaine (EMLA) cream Apply to port site one hour prior to use. Do not rub in. Cover with plastic.  Marland Kitchen loperamide (IMODIUM) 2 MG capsule Take by mouth as needed for diarrhea or loose stools.  Marland Kitchen loratadine (CLARITIN) 10 MG tablet Take 10 mg by mouth daily.  . magic mouthwash SOLN Take 5 mLs by mouth 4 (four)  times daily as needed for mouth pain.  . Omega-3 Fatty Acids (OMEGA-3 PO) Take 1 capsule by mouth daily.  . ondansetron (ZOFRAN-ODT) 4 MG disintegrating tablet Take 1 tablet (4 mg total) by mouth every 8 (eight) hours as needed for nausea or vomiting.  Marland Kitchen oxyCODONE-acetaminophen (PERCOCET/ROXICET) 5-325 MG tablet Take 1 tablet by mouth every 6 (six) hours as needed for severe pain.  . polyethylene glycol (MIRALAX / GLYCOLAX) packet Take 17 g by mouth daily.  Marland Kitchen PROAIR HFA 108 (90 Base) MCG/ACT inhaler INHALE 2 PUFFS INTO THE LUNGS FOUR TIMES DAILY AS NEEDED FOR WHEEZING  . senna (SENOKOT) 8.6 MG TABS tablet Take 2 tablets (17.2 mg total) by mouth daily.  . Tiotropium Bromide-Olodaterol (STIOLTO RESPIMAT) 2.5-2.5 MCG/ACT AERS Inhale 2 puffs into the lungs daily.  . traMADol (ULTRAM) 50 MG tablet TAKE 1 TABLET BY MOUTH EVERY 12 HOURS AS NEEDED FOR PAIN  . triamterene-hydrochlorothiazide (MAXZIDE-25) 37.5-25 MG tablet Take 0.5 tablets by mouth daily.   . vitamin B-12 (CYANOCOBALAMIN) 100 MCG tablet Take 100 mcg by mouth daily. Reported on 01/19/2016  . zolpidem (AMBIEN) 5 MG tablet Take 1 tablet (5 mg total) by mouth at bedtime as needed for sleep.  Marland Kitchen azithromycin (ZITHROMAX Z-PAK) 250 MG tablet Take 2 tablets (500 mg) on  Day 1,  followed by 1 tablet (250 mg) once daily on Days 2 through 5.  . predniSONE (DELTASONE) 10 MG tablet 4 tabs for 2 days, then 3 tabs for 2 days, 2 tabs for 2 days, then 1 tab for 2 days, then stop   No facility-administered encounter medications on file as of 11/23/2016.      Review of Systems  Constitutional:   No  weight loss, night sweats,  Fevers, chills,  +fatigue, or  lassitude.  HEENT:   No headaches,  Difficulty swallowing,  Tooth/dental problems, or  Sore throat,                No sneezing, itching, ear ache, +nasal congestion, post nasal drip,   CV:  No chest pain,  Orthopnea, PND, swelling in lower extremities, anasarca, dizziness, palpitations, syncope.    GI  No heartburn, indigestion, abdominal pain, nausea, vomiting, diarrhea, change in bowel habits, loss of appetite, bloody stools.   Resp:    No chest wall deformity  Skin: no rash or lesions.  GU: no dysuria, change in color of urine, no urgency or frequency.  No flank pain, no hematuria   MS:  No joint pain or swelling.  No decreased range of motion.  No back pain.    Physical Exam  BP 122/70 (BP Location: Left Arm, Cuff Size: Normal)   Pulse 86   Ht 5\' 1"  (1.549 m)   Wt 190 lb 12.8 oz (86.5 kg)   SpO2 98%   BMI  36.05 kg/m   GEN: A/Ox3; pleasant , NAD, elderly    HEENT:  [place]/AT,  EACs-clear, TMs-wnl, NOSE-clear drainage  THROAT-clear, no lesions, no postnasal drip or exudate noted.   NECK:  Supple w/ fair ROM; no JVD; normal carotid impulses w/o bruits; no thyromegaly or nodules palpated; no lymphadenopathy.    RESP few rhonchi , no accessory muscle use, no dullness to percussion  CARD:  RRR, no m/r/g, no peripheral edema, pulses intact, no cyanosis or clubbing.  GI:   Soft & nt; nml bowel sounds; no organomegaly or masses detected.   Musco: Warm bil, no deformities or joint swelling noted.   Neuro: alert, no focal deficits noted.    Skin: Warm, no lesions or rashes    Lab Results:  CBC    Component Value Date/Time   WBC 3.5 (L) 11/22/2016 0907   WBC 6.2 10/18/2016 0336   RBC 3.95 11/22/2016 0907   RBC 4.26 10/18/2016 0336   HGB 12.1 11/22/2016 0907   HCT 36.2 11/22/2016 0907   PLT 219 11/22/2016 0907   MCV 91.8 11/22/2016 0907   MCH 30.6 11/22/2016 0907   MCH 30.0 10/18/2016 0336   MCHC 33.3 11/22/2016 0907   MCHC 31.8 10/18/2016 0336   RDW 17.2 (H) 11/22/2016 0907   LYMPHSABS 1.1 11/22/2016 0907   MONOABS 0.4 11/22/2016 0907   EOSABS 0.1 11/22/2016 0907   BASOSABS 0.0 11/22/2016 0907    BMET    Component Value Date/Time   NA 140 11/22/2016 0907   K 4.0 11/22/2016 0907   CL 105 10/18/2016 0336   CO2 30 (H) 11/22/2016 0907   GLUCOSE 100  11/22/2016 0907   BUN 21.8 11/22/2016 0907   CREATININE 0.9 11/22/2016 0907   CALCIUM 9.7 11/22/2016 0907   GFRNONAA 52 (L) 10/18/2016 0336   GFRAA >60 10/18/2016 0336    BNP    Component Value Date/Time   BNP 32.5 10/18/2016 0336    ProBNP No results found for: PROBNP  Imaging: No results found.   Assessment & Plan:   COPD (chronic obstructive pulmonary disease) ([place]) Exacerbation -immunosuppressed pt on chemo  Check CXR   Plan  Patient Instructions  Chest xray today .  ZPack take as directed.  Prednisone taper over next .  Mucinex DM Twice daily  As needed  Cough/congestion .  Continue on Stiolto 1 puff daily  Follow up with Dr. Lamonte Sakai  In 6 weeks and As needed   Please contact office for sooner follow up if symptoms do not improve or worsen or seek emergency care         Rexene Edison, NP 11/23/2016

## 2016-11-23 NOTE — Patient Instructions (Signed)
Chest xray today .  ZPack take as directed.  Prednisone taper over next .  Mucinex DM Twice daily  As needed  Cough/congestion .  Continue on Stiolto 1 puff daily  Follow up with Dr. Lamonte Sakai  In 6 weeks and As needed   Please contact office for sooner follow up if symptoms do not improve or worsen or seek emergency care

## 2016-11-24 ENCOUNTER — Telehealth: Payer: Self-pay | Admitting: Adult Health

## 2016-11-24 ENCOUNTER — Telehealth: Payer: Self-pay | Admitting: *Deleted

## 2016-11-24 MED ORDER — TIOTROPIUM BROMIDE-OLODATEROL 2.5-2.5 MCG/ACT IN AERS: 2.0000 | INHALATION_SPRAY | Freq: Every day | RESPIRATORY_TRACT | 0 refills | Status: AC

## 2016-11-24 NOTE — Telephone Encounter (Signed)
Pt called for result of tumor marker. Result given, she voiced understanding.

## 2016-11-24 NOTE — Telephone Encounter (Signed)
lmomtcb x1 

## 2016-11-24 NOTE — Addendum Note (Signed)
Addended by: Parke Poisson E on: 11/24/2016 05:50 PM   Modules accepted: Orders

## 2016-11-27 DIAGNOSIS — M170 Bilateral primary osteoarthritis of knee: Secondary | ICD-10-CM | POA: Diagnosis not present

## 2016-11-27 NOTE — Telephone Encounter (Signed)
lmom tcb x2  Result Notes   Notes recorded by Melvenia Needles, NP on 11/24/2016 at 5:26 PM EDT No sign of PNA  Cont w/ ov recs Please contact office for sooner follow up if symptoms do not improve or worsen or seek emergency care

## 2016-11-27 NOTE — Progress Notes (Signed)
Spoke with pt and notified of results per TP. Pt verbalized understanding and denied any questions. 

## 2016-11-27 NOTE — Telephone Encounter (Signed)
[phone] pt calling back

## 2016-11-27 NOTE — Telephone Encounter (Signed)
Spoke with pt and notified of results per TP. Pt verbalized understanding and denied any questions. 

## 2016-11-30 ENCOUNTER — Telehealth: Payer: Self-pay | Admitting: *Deleted

## 2016-11-30 NOTE — Telephone Encounter (Signed)
-----   Message from [name], NP sent at 11/23/2016  9:17 AM EDT ----- Please let her know CA-125 is better

## 2016-11-30 NOTE — Telephone Encounter (Signed)
Rosalio Macadamia RN has already informed pt of CA-125.

## 2016-12-04 ENCOUNTER — Other Ambulatory Visit: Payer: Self-pay | Admitting: Oncology

## 2016-12-05 DIAGNOSIS — M170 Bilateral primary osteoarthritis of knee: Secondary | ICD-10-CM | POA: Diagnosis not present

## 2016-12-06 ENCOUNTER — Other Ambulatory Visit (HOSPITAL_BASED_OUTPATIENT_CLINIC_OR_DEPARTMENT_OTHER): Payer: Medicare Other

## 2016-12-06 ENCOUNTER — Ambulatory Visit (HOSPITAL_BASED_OUTPATIENT_CLINIC_OR_DEPARTMENT_OTHER): Payer: Medicare Other

## 2016-12-06 ENCOUNTER — Ambulatory Visit (HOSPITAL_BASED_OUTPATIENT_CLINIC_OR_DEPARTMENT_OTHER): Payer: Medicare Other | Admitting: Oncology

## 2016-12-06 ENCOUNTER — Telehealth: Payer: Self-pay | Admitting: Oncology

## 2016-12-06 ENCOUNTER — Ambulatory Visit: Payer: Medicare Other

## 2016-12-06 ENCOUNTER — Other Ambulatory Visit: Payer: Self-pay | Admitting: Oncology

## 2016-12-06 VITALS — BP 146/64 | HR 73 | Temp 97.8°F | Resp 19 | Ht 61.0 in | Wt 191.0 lb

## 2016-12-06 VITALS — BP 134/57

## 2016-12-06 DIAGNOSIS — C786 Secondary malignant neoplasm of retroperitoneum and peritoneum: Secondary | ICD-10-CM | POA: Diagnosis not present

## 2016-12-06 DIAGNOSIS — C5701 Malignant neoplasm of right fallopian tube: Secondary | ICD-10-CM

## 2016-12-06 DIAGNOSIS — C569 Malignant neoplasm of unspecified ovary: Secondary | ICD-10-CM | POA: Diagnosis not present

## 2016-12-06 DIAGNOSIS — Z95828 Presence of other vascular implants and grafts: Secondary | ICD-10-CM

## 2016-12-06 DIAGNOSIS — C482 Malignant neoplasm of peritoneum, unspecified: Secondary | ICD-10-CM

## 2016-12-06 DIAGNOSIS — Z5112 Encounter for antineoplastic immunotherapy: Secondary | ICD-10-CM | POA: Diagnosis not present

## 2016-12-06 DIAGNOSIS — Z5111 Encounter for antineoplastic chemotherapy: Secondary | ICD-10-CM

## 2016-12-06 LAB — CBC WITH DIFFERENTIAL/PLATELET
BASO%: 1.1 % (ref 0.0–2.0)
Basophils Absolute: 0 10*3/uL (ref 0.0–0.1)
EOS%: 2.2 % (ref 0.0–7.0)
Eosinophils Absolute: 0.1 10*3/uL (ref 0.0–0.5)
HEMATOCRIT: 37.1 % (ref 34.8–46.6)
HEMOGLOBIN: 12.1 g/dL (ref 11.6–15.9)
LYMPH#: 0.9 10*3/uL (ref 0.9–3.3)
LYMPH%: 24.7 % (ref 14.0–49.7)
MCH: 30.1 pg (ref 25.1–34.0)
MCHC: 32.5 g/dL (ref 31.5–36.0)
MCV: 92.7 fL (ref 79.5–101.0)
MONO#: 0.4 10*3/uL (ref 0.1–0.9)
MONO%: 12.4 % (ref 0.0–14.0)
NEUT#: 2.1 10*3/uL (ref 1.5–6.5)
NEUT%: 59.6 % (ref 38.4–76.8)
Platelets: 187 10*3/uL (ref 145–400)
RBC: 4.01 10*6/uL (ref 3.70–5.45)
RDW: 17.6 % — AB (ref 11.2–14.5)
WBC: 3.6 10*3/uL — AB (ref 3.9–10.3)

## 2016-12-06 LAB — COMPREHENSIVE METABOLIC PANEL
ALT: 19 U/L (ref 0–55)
AST: 14 U/L (ref 5–34)
Albumin: 3.3 g/dL — ABNORMAL LOW (ref 3.5–5.0)
Alkaline Phosphatase: 62 U/L (ref 40–150)
Anion Gap: 6 mEq/L (ref 3–11)
BUN: 26.4 mg/dL — AB (ref 7.0–26.0)
CO2: 30 mEq/L — ABNORMAL HIGH (ref 22–29)
CREATININE: 0.9 mg/dL (ref 0.6–1.1)
Calcium: 9.6 mg/dL (ref 8.4–10.4)
Chloride: 104 mEq/L (ref 98–109)
EGFR: 61 mL/min/{1.73_m2} — ABNORMAL LOW (ref >90)
GLUCOSE: 105 mg/dL (ref 70–140)
POTASSIUM: 4.8 meq/L (ref 3.5–5.1)
SODIUM: 140 meq/L (ref 136–145)
Total Bilirubin: 0.6 mg/dL (ref 0.20–1.20)
Total Protein: 6.4 g/dL (ref 6.4–8.3)

## 2016-12-06 LAB — UA PROTEIN, DIPSTICK - CHCC: PROTEIN: NEGATIVE mg/dL

## 2016-12-06 MED ORDER — SODIUM CHLORIDE 0.9 % IV SOLN
Freq: Once | INTRAVENOUS | Status: AC
  Administered 2016-12-06: 12:00:00 via INTRAVENOUS

## 2016-12-06 MED ORDER — SODIUM CHLORIDE 0.9 % IJ SOLN
10.0000 mL | INTRAMUSCULAR | Status: DC | PRN
  Administered 2016-12-06: 10 mL via INTRAVENOUS
  Filled 2016-12-06: qty 10

## 2016-12-06 MED ORDER — SODIUM CHLORIDE 0.9% FLUSH
10.0000 mL | INTRAVENOUS | Status: DC | PRN
  Administered 2016-12-06: 10 mL
  Filled 2016-12-06: qty 10

## 2016-12-06 MED ORDER — DIPHENHYDRAMINE HCL 50 MG/ML IJ SOLN
INTRAMUSCULAR | Status: AC
  Filled 2016-12-06: qty 1

## 2016-12-06 MED ORDER — FAMOTIDINE IN NACL 20-0.9 MG/50ML-% IV SOLN
INTRAVENOUS | Status: AC
  Filled 2016-12-06: qty 50

## 2016-12-06 MED ORDER — FAMOTIDINE IN NACL 20-0.9 MG/50ML-% IV SOLN
20.0000 mg | Freq: Once | INTRAVENOUS | Status: AC
  Administered 2016-12-06: 20 mg via INTRAVENOUS

## 2016-12-06 MED ORDER — SODIUM CHLORIDE 0.9 % IV SOLN
10.4000 mg/kg | Freq: Once | INTRAVENOUS | Status: AC
  Administered 2016-12-06: 900 mg via INTRAVENOUS
  Filled 2016-12-06: qty 32

## 2016-12-06 MED ORDER — DIPHENHYDRAMINE HCL 50 MG/ML IJ SOLN
25.0000 mg | Freq: Once | INTRAMUSCULAR | Status: AC
  Administered 2016-12-06: 25 mg via INTRAVENOUS

## 2016-12-06 MED ORDER — DEXAMETHASONE SODIUM PHOSPHATE 10 MG/ML IJ SOLN
5.0000 mg | Freq: Once | INTRAMUSCULAR | Status: AC
  Administered 2016-12-06: 5 mg via INTRAVENOUS

## 2016-12-06 MED ORDER — PACLITAXEL CHEMO INJECTION 300 MG/50ML
80.0000 mg/m2 | Freq: Once | INTRAVENOUS | Status: AC
  Administered 2016-12-06: 150 mg via INTRAVENOUS
  Filled 2016-12-06: qty 25

## 2016-12-06 MED ORDER — DEXAMETHASONE SODIUM PHOSPHATE 10 MG/ML IJ SOLN
INTRAMUSCULAR | Status: AC
  Filled 2016-12-06: qty 1

## 2016-12-06 MED ORDER — HEPARIN SOD (PORK) LOCK FLUSH 100 UNIT/ML IV SOLN
500.0000 [IU] | Freq: Once | INTRAVENOUS | Status: AC | PRN
  Administered 2016-12-06: 500 [IU]
  Filled 2016-12-06: qty 5

## 2016-12-06 NOTE — Progress Notes (Signed)
[facility] OFFICE PROGRESS NOTE   Diagnosis: Ovarian cancer  INTERVAL HISTORY:   Lynn Ramirez returns as scheduled. She was last treated with Taxol/Avastin on 11/22/2016. No change in neuropathy symptoms. She feels well. The dyspnea improved after she completed a Z-Pak and prednisone taper by pulmonary medicine. The knee pain returned when she completed the prednisone. She takes occasional tramadol or oxycodone for knee pain.  Objective:  Vital signs in last 24 hours:  Blood pressure (!) 146/64, pulse 73, temperature 97.8 F (36.6 C), temperature source Oral, resp. rate 19, height 5\' 1"  (1.549 m), weight 191 lb (86.6 kg), SpO2 97 %.    HEENT: No thrush Resp: Distant breath sounds, no respiratory distress Cardio: Regular rate and rhythm GI: Mild tenderness in the right compared to the left abdomen. No mass. No hepatosplenomegaly. No apparent ascites Vascular: No leg edema   Portacath/PICC-without erythema  Lab Results:  Lab Results  Component Value Date   WBC 3.6 (L) 12/06/2016   HGB 12.1 12/06/2016   HCT 37.1 12/06/2016   MCV 92.7 12/06/2016   PLT 187 12/06/2016   NEUTROABS 2.1 12/06/2016    CMP     Component Value Date/Time   NA 140 12/06/2016 1023   K 4.8 12/06/2016 1023   CL 105 10/18/2016 0336   CO2 30 (H) 12/06/2016 1023   GLUCOSE 105 12/06/2016 1023   BUN 26.4 (H) 12/06/2016 1023   CREATININE 0.9 12/06/2016 1023   CALCIUM 9.6 12/06/2016 1023   PROT 6.4 12/06/2016 1023   ALBUMIN 3.3 (L) 12/06/2016 1023   AST 14 12/06/2016 1023   ALT 19 12/06/2016 1023   ALKPHOS 62 12/06/2016 1023   BILITOT 0.60 12/06/2016 1023   GFRNONAA 52 (L) 10/18/2016 0336   GFRAA >60 10/18/2016 0336  CA 125 on 11/22/2016-265.3   Medications: I have reviewed the patient's current medications.  Assessment/Plan: 1. 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 Significant improvement in malignant right pleural effusion.  Status post robotic-assisted laparoscopic hysterectomy with bilateral salpingoophorectomy, omentectomy, radical tumor debulking 12/29/2014. Per Dr. Serita Grit office note 01/14/2015 cytoreduction was optimal with residual disease remaining only in the 1 mm implants on the small intestine. 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 Cycle 1 adjuvant Taxol/carboplatin 01/20/2015 Cycle 2 adjuvant Taxol/carboplatin 02/10/2015 Cycle 3 adjuvant Taxol/carboplatin 03/10/2015 CA 125 on 1013 2016-42  CTs of the chest, abdomen, and pelvis 05/31/2015 with no evidence of progressive ovarian cancer  CTs of the chest, abdomen, and pelvis 08/07/2015 and 08/08/2015-no evidence of progressive ovarian cancer  Peritoneal studding noted at the time of the cholecystectomy procedure 08/09/2015 Elevated CA 125  10/07/2015  CT 10/07/2015 with stricturing at the sigmoid colon, constipation, and omental nodularity  Initiation of salvage weekly  Taxol chemotherapy 10/13/2015  Taxol changed to every 2 weeks beginning 01/19/2016 due to painful neuropathy.  CT scans 07/19/2016 with no acute findings. No features in the abdomen or pelvis to suggest recurrent disease.Stable mild fullness right intrarenal collecting system.  Elevated CA 125 treatment resumed with Taxol/Avastin on a 2 week schedule 09/27/2016 2. COPD-followed by Dr. Lamonte Sakai. 3. Dyspnea secondary to COPD and the large right pleural effusion, status post therapeutic thoracentesis procedures 09/30/2014,10/09/2014, and 10/21/2014. Left thoracentesis 10/30/2014 4. Left nephrectomy as a child 5. Delayed nausea following cycle 1 Taxol/carboplatin, Aloxi/Emend added with cycle 2 with improvement. 6. Right lower extremity edema, right calf pain 12/09/2014. Negative venous Doppler 12/09/2014. 7. Diffuse pruritus following cycle 1 adjuvant Taxol/carboplatin 01/20/2015, no rash, resolved with a steroid dose pack 8. Thrombocytopenia second to chemotherapy, the carboplatin was dose reduced with cycle 2 adjuvant Taxol/carboplatin 02/10/2015 9. Chemotherapy-induced peripheral neuropathy-painful peripheral neuropathy involving the feet 01/19/2016.  10. Admission with acute cholecystitis 08/07/2015, status post a cholecystectomy 08/09/2015 11. Admission 10/08/2015 with abdominal pain/constipation, improved with bowel rest and laxatives 12. Mild right hydronephrosis noted on the CT 10/07/2015. Renal ultrasound 12/16/2015 with no hydronephrosis noted.No hydronephrosis on CT 07/19/2016.    Disposition:  The CA 125 has declined while on Taxol/Avastin. She appears to be tolerating the treatment well. The plan is to continue every 2 week Taxol/Avastin. She will return for an office visit and chemotherapy in 2 weeks.  15 minutes were spent with the patient  today. The majority of the time was used for counseling and coordination of care.  Donneta Romberg, MD  12/06/2016  11:20 AM

## 2016-12-06 NOTE — Telephone Encounter (Signed)
Appointments scheduled per 12/06/16 los. Patient was given a copy of the AVS report and appointment schedule, per 12/06/16 los. °

## 2016-12-07 ENCOUNTER — Telehealth: Payer: Self-pay | Admitting: *Deleted

## 2016-12-07 LAB — CA 125: CANCER ANTIGEN (CA) 125: 293.2 U/mL — AB (ref 0.0–38.1)

## 2016-12-07 NOTE — Telephone Encounter (Signed)
Message received from patient requesting CA 125 results.  Call placed back to patient and patient given CA 125 results per order of Dr. Benay Spice.  Patient appreciative of call back.

## 2016-12-15 ENCOUNTER — Encounter: Payer: Self-pay | Admitting: Cardiology

## 2016-12-17 ENCOUNTER — Other Ambulatory Visit: Payer: Self-pay | Admitting: Oncology

## 2016-12-18 ENCOUNTER — Encounter: Payer: Self-pay | Admitting: Physician Assistant

## 2016-12-20 ENCOUNTER — Telehealth: Payer: Self-pay | Admitting: Emergency Medicine

## 2016-12-20 ENCOUNTER — Ambulatory Visit: Payer: Medicare Other

## 2016-12-20 ENCOUNTER — Ambulatory Visit (HOSPITAL_BASED_OUTPATIENT_CLINIC_OR_DEPARTMENT_OTHER): Payer: Medicare Other | Admitting: Nurse Practitioner

## 2016-12-20 ENCOUNTER — Other Ambulatory Visit (HOSPITAL_BASED_OUTPATIENT_CLINIC_OR_DEPARTMENT_OTHER): Payer: Medicare Other

## 2016-12-20 ENCOUNTER — Ambulatory Visit (HOSPITAL_BASED_OUTPATIENT_CLINIC_OR_DEPARTMENT_OTHER): Payer: Medicare Other

## 2016-12-20 ENCOUNTER — Ambulatory Visit (HOSPITAL_COMMUNITY)
Admission: RE | Admit: 2016-12-20 | Discharge: 2016-12-20 | Disposition: A | Discharge status: Home / Self Care | Payer: Medicare Other | Source: Ambulatory Visit | Attending: Nurse Practitioner | Admitting: Nurse Practitioner

## 2016-12-20 DIAGNOSIS — C482 Malignant neoplasm of peritoneum, unspecified: Secondary | ICD-10-CM

## 2016-12-20 DIAGNOSIS — C7961 Secondary malignant neoplasm of right ovary: Secondary | ICD-10-CM

## 2016-12-20 DIAGNOSIS — R0602 Shortness of breath: Secondary | ICD-10-CM

## 2016-12-20 DIAGNOSIS — Z5112 Encounter for antineoplastic immunotherapy: Secondary | ICD-10-CM | POA: Diagnosis not present

## 2016-12-20 DIAGNOSIS — R05 Cough: Secondary | ICD-10-CM

## 2016-12-20 DIAGNOSIS — J9611 Chronic respiratory failure with hypoxia: Secondary | ICD-10-CM

## 2016-12-20 DIAGNOSIS — Z5111 Encounter for antineoplastic chemotherapy: Secondary | ICD-10-CM

## 2016-12-20 DIAGNOSIS — C7962 Secondary malignant neoplasm of left ovary: Secondary | ICD-10-CM

## 2016-12-20 DIAGNOSIS — J449 Chronic obstructive pulmonary disease, unspecified: Secondary | ICD-10-CM | POA: Diagnosis not present

## 2016-12-20 DIAGNOSIS — R062 Wheezing: Secondary | ICD-10-CM | POA: Diagnosis not present

## 2016-12-20 DIAGNOSIS — C569 Malignant neoplasm of unspecified ovary: Secondary | ICD-10-CM

## 2016-12-20 DIAGNOSIS — C5701 Malignant neoplasm of right fallopian tube: Secondary | ICD-10-CM | POA: Diagnosis not present

## 2016-12-20 DIAGNOSIS — Z95828 Presence of other vascular implants and grafts: Secondary | ICD-10-CM

## 2016-12-20 DIAGNOSIS — J441 Chronic obstructive pulmonary disease with (acute) exacerbation: Secondary | ICD-10-CM

## 2016-12-20 LAB — COMPREHENSIVE METABOLIC PANEL
ALT: 16 U/L (ref 0–55)
AST: 15 U/L (ref 5–34)
Albumin: 3.3 g/dL — ABNORMAL LOW (ref 3.5–5.0)
Alkaline Phosphatase: 80 U/L (ref 40–150)
Anion Gap: 9 mEq/L (ref 3–11)
BUN: 22 mg/dL (ref 7.0–26.0)
CALCIUM: 10 mg/dL (ref 8.4–10.4)
CHLORIDE: 102 meq/L (ref 98–109)
CO2: 31 meq/L — AB (ref 22–29)
CREATININE: 0.9 mg/dL (ref 0.6–1.1)
EGFR: 61 mL/min/{1.73_m2} — ABNORMAL LOW (ref >90)
GLUCOSE: 124 mg/dL (ref 70–140)
Potassium: 4.7 mEq/L (ref 3.5–5.1)
SODIUM: 143 meq/L (ref 136–145)
Total Bilirubin: 0.46 mg/dL (ref 0.20–1.20)
Total Protein: 6.7 g/dL (ref 6.4–8.3)

## 2016-12-20 LAB — CBC WITH DIFFERENTIAL/PLATELET
BASO%: 0.9 % (ref 0.0–2.0)
BASOS ABS: 0 10*3/uL (ref 0.0–0.1)
EOS ABS: 0.3 10*3/uL (ref 0.0–0.5)
EOS%: 7.2 % — ABNORMAL HIGH (ref 0.0–7.0)
HEMATOCRIT: 37.2 % (ref 34.8–46.6)
HEMOGLOBIN: 11.3 g/dL — AB (ref 11.6–15.9)
LYMPH#: 0.8 10*3/uL — AB (ref 0.9–3.3)
LYMPH%: 24.3 % (ref 14.0–49.7)
MCH: 29.9 pg (ref 25.1–34.0)
MCHC: 30.4 g/dL — AB (ref 31.5–36.0)
MCV: 98.4 fL (ref 79.5–101.0)
MONO#: 0.5 10*3/uL (ref 0.1–0.9)
MONO%: 15 % — ABNORMAL HIGH (ref 0.0–14.0)
NEUT#: 1.8 10*3/uL (ref 1.5–6.5)
NEUT%: 52.6 % (ref 38.4–76.8)
PLATELETS: 242 10*3/uL (ref 145–400)
RBC: 3.78 10*6/uL (ref 3.70–5.45)
RDW: 17.9 % — ABNORMAL HIGH (ref 11.2–14.5)
WBC: 3.5 10*3/uL — ABNORMAL LOW (ref 3.9–10.3)

## 2016-12-20 MED ORDER — DIPHENHYDRAMINE HCL 50 MG/ML IJ SOLN
INTRAMUSCULAR | Status: AC
  Filled 2016-12-20: qty 1

## 2016-12-20 MED ORDER — SODIUM CHLORIDE 0.9 % IJ SOLN
10.0000 mL | INTRAMUSCULAR | Status: DC | PRN
  Administered 2016-12-20: 10 mL via INTRAVENOUS
  Filled 2016-12-20: qty 10

## 2016-12-20 MED ORDER — DIPHENOXYLATE-ATROPINE 2.5-0.025 MG PO TABS: ORAL_TABLET | ORAL | 0 refills | Status: DC

## 2016-12-20 MED ORDER — FAMOTIDINE IN NACL 20-0.9 MG/50ML-% IV SOLN
INTRAVENOUS | Status: AC
  Filled 2016-12-20: qty 50

## 2016-12-20 MED ORDER — SODIUM CHLORIDE 0.9 % IV SOLN
Freq: Once | INTRAVENOUS | Status: AC
  Administered 2016-12-20: 12:00:00 via INTRAVENOUS

## 2016-12-20 MED ORDER — ALBUTEROL SULFATE (2.5 MG/3ML) 0.083% IN NEBU
2.5000 mg | INHALATION_SOLUTION | Freq: Once | RESPIRATORY_TRACT | Status: AC
  Administered 2016-12-20: 2.5 mg via RESPIRATORY_TRACT
  Filled 2016-12-20: qty 3

## 2016-12-20 MED ORDER — METHYLPREDNISOLONE 4 MG PO TBPK: ORAL_TABLET | ORAL | 0 refills | Status: DC

## 2016-12-20 MED ORDER — DEXAMETHASONE SODIUM PHOSPHATE 10 MG/ML IJ SOLN
5.0000 mg | Freq: Once | INTRAMUSCULAR | Status: AC
  Administered 2016-12-20: 5 mg via INTRAVENOUS

## 2016-12-20 MED ORDER — SODIUM CHLORIDE 0.9% FLUSH
10.0000 mL | INTRAVENOUS | Status: DC | PRN
  Administered 2016-12-20: 10 mL
  Filled 2016-12-20: qty 10

## 2016-12-20 MED ORDER — DEXAMETHASONE SODIUM PHOSPHATE 10 MG/ML IJ SOLN
INTRAMUSCULAR | Status: AC
  Filled 2016-12-20: qty 1

## 2016-12-20 MED ORDER — BEVACIZUMAB CHEMO INJECTION 400 MG/16ML
10.5000 mg/kg | Freq: Once | INTRAVENOUS | Status: AC
  Administered 2016-12-20: 900 mg via INTRAVENOUS
  Filled 2016-12-20: qty 32

## 2016-12-20 MED ORDER — DIPHENHYDRAMINE HCL 50 MG/ML IJ SOLN
25.0000 mg | Freq: Once | INTRAMUSCULAR | Status: AC
  Administered 2016-12-20: 25 mg via INTRAVENOUS

## 2016-12-20 MED ORDER — HEPARIN SOD (PORK) LOCK FLUSH 100 UNIT/ML IV SOLN
500.0000 [IU] | Freq: Once | INTRAVENOUS | Status: AC | PRN
  Administered 2016-12-20: 500 [IU]
  Filled 2016-12-20: qty 5

## 2016-12-20 MED ORDER — PACLITAXEL CHEMO INJECTION 300 MG/50ML
80.0000 mg/m2 | Freq: Once | INTRAVENOUS | Status: AC
  Administered 2016-12-20: 150 mg via INTRAVENOUS
  Filled 2016-12-20: qty 25

## 2016-12-20 MED ORDER — ALBUTEROL SULFATE (2.5 MG/3ML) 0.083% IN NEBU
INHALATION_SOLUTION | RESPIRATORY_TRACT | Status: AC
  Filled 2016-12-20: qty 3

## 2016-12-20 MED ORDER — FAMOTIDINE IN NACL 20-0.9 MG/50ML-% IV SOLN
20.0000 mg | Freq: Once | INTRAVENOUS | Status: AC
  Administered 2016-12-20: 20 mg via INTRAVENOUS

## 2016-12-20 NOTE — Progress Notes (Signed)
Lynn Ramirez   Diagnosis:  Ovarian cancer  INTERVAL HISTORY:   Lynn Ramirez returns as scheduled. She completed another cycle of Taxol/Avastin 12/06/2016. She denies nausea/vomiting. No mouth sores. She continues to have intermittent loose stools. She takes Lomotil as needed. Over the past several days she has noted increased shortness of breath and a worsening cough. She reports becoming short of breath with minimal exertion. She is utilizing her "rescue inhaler" every 4 hours. She notes improvement following the rescue inhaler for about 1-1/2 hours. No fever. No chest pain. She notes bilateral foot and ankle edema.  Objective:  Vital signs in last 24 hours:  Blood pressure 130/79, pulse 74, temperature 97.9 F (36.6 C), temperature source Oral, resp. rate 18, height 5\' 1"  (1.549 m), weight 193 lb 8 oz (87.8 kg), SpO2 94 %.    HEENT: No thrush or ulcers. Resp: Breath sounds diminished throughout with scattered wheezes. Cardio: Regular rate and rhythm. GI: Abdomen is soft. Tender at the upper abdomen. No hepatomegaly. No mass. Vascular: No leg edema. Neuro: Alert and oriented.  Port-A-Cath without erythema.  Lab Results:  Lab Results  Component Value Date   WBC 3.5 (L) 12/20/2016   HGB 11.3 (L) 12/20/2016   HCT 37.2 12/20/2016   MCV 98.4 12/20/2016   PLT 242 12/20/2016   NEUTROABS 1.8 12/20/2016    Imaging:  No results found.  Medications: I have reviewed the patient's current medications.  Assessment/Plan: 1. 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 Significant improvement in malignant right pleural effusion.  Status post robotic-assisted laparoscopic hysterectomy with bilateral salpingoophorectomy, omentectomy, radical tumor debulking 12/29/2014. Per Dr. Serita Grit office Ramirez 01/14/2015 cytoreduction was optimal with residual disease remaining only in the 1 mm implants on the small intestine. 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 Cycle 1 adjuvant Taxol/carboplatin 01/20/2015 Cycle 2 adjuvant Taxol/carboplatin 02/10/2015 Cycle 3 adjuvant Taxol/carboplatin 03/10/2015 CA 125 on 1013 2016-42  CTs of the chest, abdomen, and pelvis 05/31/2015 with no evidence of progressive ovarian cancer  CTs of the chest, abdomen, and pelvis 08/07/2015 and 08/08/2015-no evidence of progressive ovarian cancer  Peritoneal studding noted at the time of the cholecystectomy procedure 08/09/2015 Elevated CA 125 10/07/2015  CT 10/07/2015 with stricturing at the sigmoid colon, constipation, and omental nodularity  Initiation of salvage weekly Taxol chemotherapy 10/13/2015  Taxol changed to every 2 weeks beginning 01/19/2016 due to painful neuropathy.  CT scans 07/19/2016 with no  acute findings. No features in the abdomen or pelvis to suggest recurrent disease.Stable mild fullness right intrarenal collecting system.  Elevated CA 125 treatment resumed with Taxol/Avastin on a 2 week schedule 09/27/2016 2. COPD-followed by Lynn Ramirez. 3. Dyspnea secondary to COPD and the large right pleural  effusion, status post therapeutic thoracentesis procedures 09/30/2014,10/09/2014, and 10/21/2014. Left thoracentesis 10/30/2014 4. Left nephrectomy as a child 5. Delayed nausea following cycle 1 Taxol/carboplatin, Aloxi/Emend added with cycle 2 with improvement. 6. Right lower extremity edema, right calf pain 12/09/2014. Negative venous Doppler 12/09/2014. 7. Diffuse pruritus following cycle 1 adjuvant Taxol/carboplatin 01/20/2015, no rash, resolved with a steroid dose pack 8. Thrombocytopenia second to chemotherapy, the carboplatin was dose reduced with cycle 2 adjuvant Taxol/carboplatin 02/10/2015 9. Chemotherapy-induced peripheral neuropathy-painful peripheral neuropathy involving the feet 01/19/2016.  10. Admission with acute cholecystitis 08/07/2015, status post a cholecystectomy 08/09/2015 11. Admission 10/08/2015 with abdominal pain/constipation, improved with bowel rest and laxatives 12. Mild right hydronephrosis noted on the CT 10/07/2015. Renal ultrasound 12/16/2015 with no hydronephrosis noted.No hydronephrosis on CT 07/19/2016.     Disposition: Lynn Ramirez presents today with increased shortness of breath and a cough over the past several days. Clinically this appears to be a COPD exacerbation. We are referring her for a chest x-ray to evaluate for pleural effusions, pneumonia. We prescribed a Medrol Dosepak. She will receive an albuterol nebulizer treatment while in the office today. If her symptoms do not improve over the next several days she will contact Lynn Ramirez.  The plan is to proceed with Taxol/Avastin today as scheduled. She will return for a follow-up visit in  2 weeks.  Patient seen with Lynn Ramirez. 25 minutes were spent face-to-face at today's visit with the majority of that time involved in counseling/coordination of care.    Lynn Ramirez ANP/GNP-BC   12/20/2016  10:32 AM  This was a shared visit with Lynn Ramirez. Lynn Ramirez was interviewed and examined. She appears to have a COPD flare at present. She will be referred for a chest x-ray today. The plan is to continue every 2 week Taxol/Avastin.  Lynn Ramirez, M.D.

## 2016-12-20 NOTE — Telephone Encounter (Signed)
lmtcb for pt.  

## 2016-12-20 NOTE — Patient Instructions (Signed)
Implanted Port Home Guide An implanted port is a type of central line that is placed under the skin. Central lines are used to provide IV access when treatment or nutrition needs to be given through a person's veins. Implanted ports are used for long-term IV access. An implanted port may be placed because:  You need IV medicine that would be irritating to the small veins in your hands or arms.  You need long-term IV medicines, such as antibiotics.  You need IV nutrition for a long period.  You need frequent blood draws for lab tests.  You need dialysis.  Implanted ports are usually placed in the chest area, but they can also be placed in the upper arm, the abdomen, or the leg. An implanted port has two main parts:  Reservoir. The reservoir is round and will appear as a small, raised area under your skin. The reservoir is the part where a needle is inserted to give medicines or draw blood.  Catheter. The catheter is a thin, flexible tube that extends from the reservoir. The catheter is placed into a large vein. Medicine that is inserted into the reservoir goes into the catheter and then into the vein.  How will I care for my incision site? Do not get the incision site wet. Bathe or shower as directed by your health care provider. How is my port accessed? Special steps must be taken to access the port:  Before the port is accessed, a numbing cream can be placed on the skin. This helps numb the skin over the port site.  Your health care provider uses a sterile technique to access the port. ? Your health care provider must put on a mask and sterile gloves. ? The skin over your port is cleaned carefully with an antiseptic and allowed to dry. ? The port is gently pinched between sterile gloves, and a needle is inserted into the port.  Only "non-coring" port needles should be used to access the port. Once the port is accessed, a blood return should be checked. This helps ensure that the port  is in the vein and is not clogged.  If your port needs to remain accessed for a constant infusion, a clear (transparent) bandage will be placed over the needle site. The bandage and needle will need to be changed every week, or as directed by your health care provider.  Keep the bandage covering the needle clean and dry. Do not get it wet. Follow your health care provider's instructions on how to take a shower or bath while the port is accessed.  If your port does not need to stay accessed, no bandage is needed over the port.  What is flushing? Flushing helps keep the port from getting clogged. Follow your health care provider's instructions on how and when to flush the port. Ports are usually flushed with saline solution or a medicine called heparin. The need for flushing will depend on how the port is used.  If the port is used for intermittent medicines or blood draws, the port will need to be flushed: ? After medicines have been given. ? After blood has been drawn. ? As part of routine maintenance.  If a constant infusion is running, the port may not need to be flushed.  How long will my port stay implanted? The port can stay in for as long as your health care provider thinks it is needed. When it is time for the port to come out, surgery will be   done to remove it. The procedure is similar to the one performed when the port was put in. When should I seek immediate medical care? When you have an implanted port, you should seek immediate medical care if:  You notice a bad smell coming from the incision site.  You have swelling, redness, or drainage at the incision site.  You have more swelling or pain at the port site or the surrounding area.  You have a fever that is not controlled with medicine.  This information is not intended to replace advice given to you by your health care provider. Make sure you discuss any questions you have with your health care provider. Document  Released: 06/26/2005 Document Revised: 12/02/2015 Document Reviewed: 03/03/2013 Elsevier Interactive Patient Education  2017 Elsevier Inc.  

## 2016-12-20 NOTE — Telephone Encounter (Signed)
Pt returning call.Lynn Ramirez ° °

## 2016-12-20 NOTE — Patient Instructions (Signed)
[facility] Discharge Instructions for Patients Receiving Chemotherapy  Today you received the following chemotherapy agents Avastin/Taxol  To help prevent nausea and vomiting after your treatment, we encourage you to take your nausea medication as needed   If you develop nausea and vomiting that is not controlled by your nausea medication, call the clinic.   BELOW ARE SYMPTOMS THAT SHOULD BE REPORTED IMMEDIATELY:  *FEVER GREATER THAN 100.5 F  *CHILLS WITH OR WITHOUT FEVER  NAUSEA AND VOMITING THAT IS NOT CONTROLLED WITH YOUR NAUSEA MEDICATION  *UNUSUAL SHORTNESS OF BREATH  *UNUSUAL BRUISING OR BLEEDING  TENDERNESS IN MOUTH AND THROAT WITH OR WITHOUT PRESENCE OF ULCERS  *URINARY PROBLEMS  *BOWEL PROBLEMS  UNUSUAL RASH Items with * indicate a potential emergency and should be followed up as soon as possible.  Feel free to call the clinic you have any questions or concerns. The clinic phone number is [phone].  Please show the [facility] at check-in to the Emergency Department and triage nurse.

## 2016-12-21 ENCOUNTER — Encounter: Payer: Self-pay | Admitting: *Deleted

## 2016-12-21 ENCOUNTER — Telehealth: Payer: Self-pay | Admitting: Cardiology

## 2016-12-21 LAB — CA 125: Cancer Antigen (CA) 125: 290 U/mL — ABNORMAL HIGH (ref 0.0–38.1)

## 2016-12-21 MED ORDER — ALBUTEROL SULFATE (2.5 MG/3ML) 0.083% IN NEBU: 2.5000 mg | INHALATION_SOLUTION | Freq: Four times a day (QID) | RESPIRATORY_TRACT | 12 refills | Status: DC | PRN

## 2016-12-21 NOTE — Telephone Encounter (Signed)
New Message:     Pt says she needs to ask some questions about the lab work she is going to have next week.

## 2016-12-21 NOTE — Telephone Encounter (Signed)
Ok to order DME new compressor nebulizer machine                                Albuterol 0.083% neb solution, , # 75 ampules   1 every 6 hours if needed,  Ref x 12

## 2016-12-21 NOTE — Telephone Encounter (Signed)
Spoke with the pt  She is asking that we change DME for POC from [facility] to another DME  She says it is taking too long for them to provide her with a POC  Order sent to [facility]

## 2016-12-21 NOTE — Telephone Encounter (Signed)
Pt returned phone call, pt contact # [phone].Lynn Ramirez

## 2016-12-21 NOTE — Telephone Encounter (Signed)
Spoke with the pt  She states she has been coughing and having increased SOB for the past 3 days  She was seen at the [facility] yesterday and they gave her an albuterol neb tx  She states that this helped her very much and she was able to produce more sputum, clear in color  They also started her on medrol dosepack and this has helped  She is asking if we can prescribe her albuterol neb sol, as this would be a new neb start for her  Will forward to DOD in RB's absence  Note that she has planned appt with RB for 01/04/17 Please advise, thanks! Allergies  Allergen Reactions  . Latex Rash    Latex gloves ONLY (pt cannot wear them- no reaction if someone else touches her with latex gloves on)  . Penicillins Other (See Comments)    Has patient had a PCN reaction causing immediate rash, facial/tongue/throat swelling, SOB or lightheadedness with hypotension: Yes  Has patient had a PCN reaction causing severe rash involving mucus membranes or skin necrosis:No Has patient had a PCN reaction that required hospitalization:No Has patient had a PCN reaction occurring within the last 10 years:No If all of the above answers are "NO", then may proceed with Cephalosporin use. welps   Current Outpatient Prescriptions on File Prior to Visit  Medication Sig Dispense Refill  . Acetaminophen (TYLENOL) 325 MG CAPS Take 1 tablet by mouth 3 (three) times daily as needed (pain).     Marland Kitchen antiseptic oral rinse (BIOTENE) LIQD 15 mLs by Mouth Rinse route 3 (three) times daily.    Marland Kitchen atorvastatin (LIPITOR) 10 MG tablet TAKE 1 TABLET(10 MG) BY MOUTH DAILY 90 tablet 2  . Biotin 2500 MCG CAPS Take 1 tablet by mouth daily.    . carvedilol (COREG) 3.125 MG tablet Take 1 tablet (3.125 mg total) by mouth 2 (two) times daily with a meal. 180 tablet 1  . cholecalciferol (VITAMIN D) 1000 units tablet Take 1,000 Units by mouth daily.    . diphenoxylate-atropine (LOMOTIL) 2.5-0.025 MG tablet TAKE 2 TABLETS BY MOUTH FOUR TIMES DAILY  AS NEEDED FOR DIARRHEA OR LOOSE STOOLS 20 tablet 0  . docusate sodium (COLACE) 100 MG capsule Take 1 capsule (100 mg total) by mouth 2 (two) times daily. 60 capsule 0  . famotidine (PEPCID) 10 MG tablet Take 20 mg by mouth daily.     . fluocinonide cream (LIDEX) 6.29 % Apply 1 application topically 2 (two) times daily as needed (For ezcema).     . gabapentin (NEURONTIN) 300 MG capsule TAKE ONE CAPSULE BY MOUTH AT BEDTIME 90 capsule 0  . lidocaine-prilocaine (EMLA) cream Apply to port site one hour prior to use. Do not rub in. Cover with plastic. 30 g 0  . loperamide (IMODIUM) 2 MG capsule Take by mouth as needed for diarrhea or loose stools.    Marland Kitchen loratadine (CLARITIN) 10 MG tablet Take 10 mg by mouth daily.    . methylPREDNISolone (MEDROL DOSEPAK) 4 MG TBPK tablet Take as directed 21 tablet 0  . Omega-3 Fatty Acids (OMEGA-3 PO) Take 1 capsule by mouth daily.    . ondansetron (ZOFRAN-ODT) 4 MG disintegrating tablet Take 1 tablet (4 mg total) by mouth every 8 (eight) hours as needed for nausea or vomiting. 20 tablet 1  . oxyCODONE-acetaminophen (PERCOCET/ROXICET) 5-325 MG tablet Take 1 tablet by mouth every 6 (six) hours as needed for severe pain. 30 tablet 0  . polyethylene glycol (MIRALAX / GLYCOLAX)  packet Take 17 g by mouth daily. 14 each 0  . PROAIR HFA 108 (90 Base) MCG/ACT inhaler INHALE 2 PUFFS INTO THE LUNGS FOUR TIMES DAILY AS NEEDED FOR WHEEZING 8.5 g 5  . senna (SENOKOT) 8.6 MG TABS tablet Take 2 tablets (17.2 mg total) by mouth daily. 60 each 0  . Tiotropium Bromide-Olodaterol (STIOLTO RESPIMAT) 2.5-2.5 MCG/ACT AERS Inhale 2 puffs into the lungs daily. 1 Inhaler 5  . traMADol (ULTRAM) 50 MG tablet TAKE 1 TABLET BY MOUTH EVERY 12 HOURS AS NEEDED FOR PAIN 40 tablet 0  . triamterene-hydrochlorothiazide (MAXZIDE-25) 37.5-25 MG tablet Take 0.5 tablets by mouth daily.     . vitamin B-12 (CYANOCOBALAMIN) 100 MCG tablet Take 100 mcg by mouth daily. Reported on 01/19/2016    . zolpidem (AMBIEN) 5  MG tablet Take 1 tablet (5 mg total) by mouth at bedtime as needed for sleep. 30 tablet 0   No current facility-administered medications on file prior to visit.

## 2016-12-21 NOTE — Telephone Encounter (Signed)
Spoke with pt and she agreed to the order being placed for the nebulizer machine. The order was placed. Pt was trying to discuss her concerns about her order for her POC but the phone kept breaking up. Pt will call us back to discuss this further. Nothing further is needed.  Rx has been printed and will be given to CY to sign, then give to [facility]

## 2016-12-21 NOTE — Telephone Encounter (Signed)
Pt calling back again.Lynn Ramirez

## 2016-12-21 NOTE — Telephone Encounter (Signed)
Spoke with the pt She states [facility] which is now [facility] is taking way too long to provide her with a POC  She is asking to be switch

## 2016-12-21 NOTE — Addendum Note (Signed)
Addended by: Rosana Berger on: 12/21/2016 12:11 PM   Modules accepted: Orders

## 2016-12-21 NOTE — Telephone Encounter (Signed)
Pt calling to ask if she needs to come fasting for her upcoming lab appt on next Monday 6/18 to check lipids. Informed the pt that yes, she does need to come fasting for this lab appt.  Educated the pt on the correct way to fast.  Pt verbalized understanding and agrees with this plan.

## 2016-12-25 ENCOUNTER — Telehealth: Payer: Self-pay | Admitting: Emergency Medicine

## 2016-12-25 ENCOUNTER — Other Ambulatory Visit: Payer: Medicare Other | Admitting: *Deleted

## 2016-12-25 DIAGNOSIS — M7989 Other specified soft tissue disorders: Secondary | ICD-10-CM

## 2016-12-25 DIAGNOSIS — I2583 Coronary atherosclerosis due to lipid rich plaque: Secondary | ICD-10-CM | POA: Diagnosis not present

## 2016-12-25 DIAGNOSIS — I2510 Atherosclerotic heart disease of native coronary artery without angina pectoris: Secondary | ICD-10-CM

## 2016-12-25 DIAGNOSIS — E7849 Other hyperlipidemia: Secondary | ICD-10-CM

## 2016-12-25 DIAGNOSIS — E784 Other hyperlipidemia: Secondary | ICD-10-CM | POA: Diagnosis not present

## 2016-12-25 DIAGNOSIS — C799 Secondary malignant neoplasm of unspecified site: Secondary | ICD-10-CM | POA: Diagnosis not present

## 2016-12-25 DIAGNOSIS — M79604 Pain in right leg: Secondary | ICD-10-CM

## 2016-12-25 LAB — LIPID PANEL
Chol/HDL Ratio: 4.1 ratio (ref 0.0–4.4)
Cholesterol, Total: 135 mg/dL (ref 100–199)
HDL: 33 mg/dL — ABNORMAL LOW (ref >39)
LDL Calculated: 78 mg/dL (ref 0–99)
Triglycerides: 119 mg/dL (ref 0–149)
VLDL Cholesterol Cal: 24 mg/dL (ref 5–40)

## 2016-12-25 MED ORDER — ALBUTEROL SULFATE (2.5 MG/3ML) 0.083% IN NEBU: 2.5000 mg | INHALATION_SOLUTION | Freq: Four times a day (QID) | RESPIRATORY_TRACT | 11 refills | Status: DC | PRN

## 2016-12-25 NOTE — Telephone Encounter (Signed)
Spoke with pt. States that she needs her albuterol neb solution refilled. Rx has been sent in. Nothing further was needed.

## 2016-12-25 NOTE — Progress Notes (Signed)
ipid

## 2016-12-25 NOTE — Addendum Note (Signed)
Addended by: Eulis Foster on: 12/25/2016 09:57 AM   Modules accepted: Orders

## 2016-12-25 NOTE — Addendum Note (Signed)
Addended by: Eulis Foster on: 12/25/2016 10:00 AM   Modules accepted: Orders

## 2016-12-31 ENCOUNTER — Other Ambulatory Visit: Payer: Self-pay | Admitting: Oncology

## 2017-01-03 ENCOUNTER — Ambulatory Visit (HOSPITAL_BASED_OUTPATIENT_CLINIC_OR_DEPARTMENT_OTHER): Payer: Medicare Other | Admitting: Nurse Practitioner

## 2017-01-03 ENCOUNTER — Other Ambulatory Visit (HOSPITAL_BASED_OUTPATIENT_CLINIC_OR_DEPARTMENT_OTHER): Payer: Medicare Other

## 2017-01-03 ENCOUNTER — Ambulatory Visit (HOSPITAL_BASED_OUTPATIENT_CLINIC_OR_DEPARTMENT_OTHER): Payer: Medicare Other

## 2017-01-03 ENCOUNTER — Telehealth: Payer: Self-pay | Admitting: Oncology

## 2017-01-03 ENCOUNTER — Encounter: Payer: Self-pay | Admitting: Physician Assistant

## 2017-01-03 ENCOUNTER — Ambulatory Visit: Payer: Medicare Other

## 2017-01-03 VITALS — BP 133/55 | HR 73

## 2017-01-03 VITALS — BP 131/51 | HR 71 | Temp 97.8°F | Resp 18 | Ht 61.0 in | Wt 194.9 lb

## 2017-01-03 DIAGNOSIS — Z5112 Encounter for antineoplastic immunotherapy: Secondary | ICD-10-CM | POA: Diagnosis not present

## 2017-01-03 DIAGNOSIS — R300 Dysuria: Secondary | ICD-10-CM | POA: Diagnosis not present

## 2017-01-03 DIAGNOSIS — C482 Malignant neoplasm of peritoneum, unspecified: Secondary | ICD-10-CM | POA: Diagnosis not present

## 2017-01-03 DIAGNOSIS — C5701 Malignant neoplasm of right fallopian tube: Secondary | ICD-10-CM

## 2017-01-03 DIAGNOSIS — C7961 Secondary malignant neoplasm of right ovary: Secondary | ICD-10-CM

## 2017-01-03 DIAGNOSIS — C569 Malignant neoplasm of unspecified ovary: Secondary | ICD-10-CM

## 2017-01-03 DIAGNOSIS — Z5111 Encounter for antineoplastic chemotherapy: Secondary | ICD-10-CM | POA: Diagnosis not present

## 2017-01-03 DIAGNOSIS — C799 Secondary malignant neoplasm of unspecified site: Secondary | ICD-10-CM

## 2017-01-03 DIAGNOSIS — C7962 Secondary malignant neoplasm of left ovary: Secondary | ICD-10-CM | POA: Diagnosis not present

## 2017-01-03 DIAGNOSIS — Z95828 Presence of other vascular implants and grafts: Secondary | ICD-10-CM

## 2017-01-03 LAB — COMPREHENSIVE METABOLIC PANEL
ALBUMIN: 3.2 g/dL — AB (ref 3.5–5.0)
ALK PHOS: 70 U/L (ref 40–150)
ALT: 18 U/L (ref 0–55)
ANION GAP: 9 meq/L (ref 3–11)
AST: 13 U/L (ref 5–34)
BILIRUBIN TOTAL: 0.38 mg/dL (ref 0.20–1.20)
BUN: 20.9 mg/dL (ref 7.0–26.0)
CO2: 30 mEq/L — ABNORMAL HIGH (ref 22–29)
Calcium: 9.9 mg/dL (ref 8.4–10.4)
Chloride: 105 mEq/L (ref 98–109)
Creatinine: 1.1 mg/dL (ref 0.6–1.1)
EGFR: 50 mL/min/{1.73_m2} — AB (ref >90)
GLUCOSE: 119 mg/dL (ref 70–140)
Potassium: 4.4 mEq/L (ref 3.5–5.1)
SODIUM: 143 meq/L (ref 136–145)
TOTAL PROTEIN: 6.5 g/dL (ref 6.4–8.3)

## 2017-01-03 LAB — UA PROTEIN, DIPSTICK - CHCC: Protein, ur: <30 mg/dL

## 2017-01-03 LAB — URINALYSIS, MICROSCOPIC - CHCC
Bilirubin (Urine): NEGATIVE
Blood: NEGATIVE
GLUCOSE UR CHCC: NEGATIVE mg/dL
KETONES: NEGATIVE mg/dL
LEUKOCYTE ESTERASE: NEGATIVE
Nitrite: NEGATIVE
PH: 6 (ref 4.6–8.0)
PROTEIN: NEGATIVE mg/dL
Specific Gravity, Urine: 1.015 (ref 1.003–1.035)
UROBILINOGEN UR: 0.2 mg/dL (ref 0.2–1)

## 2017-01-03 LAB — CBC WITH DIFFERENTIAL/PLATELET
BASO%: 1.5 % (ref 0.0–2.0)
BASOS ABS: 0.1 10*3/uL (ref 0.0–0.1)
EOS ABS: 0.1 10*3/uL (ref 0.0–0.5)
EOS%: 3.3 % (ref 0.0–7.0)
HEMATOCRIT: 36.1 % (ref 34.8–46.6)
HGB: 11.7 g/dL (ref 11.6–15.9)
LYMPH%: 24.7 % (ref 14.0–49.7)
MCH: 30.4 pg (ref 25.1–34.0)
MCHC: 32.4 g/dL (ref 31.5–36.0)
MCV: 93.9 fL (ref 79.5–101.0)
MONO#: 0.5 10*3/uL (ref 0.1–0.9)
MONO%: 14.5 % — ABNORMAL HIGH (ref 0.0–14.0)
NEUT#: 2.1 10*3/uL (ref 1.5–6.5)
NEUT%: 56 % (ref 38.4–76.8)
Platelets: 222 10*3/uL (ref 145–400)
RBC: 3.84 10*6/uL (ref 3.70–5.45)
RDW: 18.7 % — ABNORMAL HIGH (ref 11.2–14.5)
WBC: 3.7 10*3/uL — ABNORMAL LOW (ref 3.9–10.3)
lymph#: 0.9 10*3/uL (ref 0.9–3.3)

## 2017-01-03 MED ORDER — HEPARIN SOD (PORK) LOCK FLUSH 100 UNIT/ML IV SOLN
500.0000 [IU] | Freq: Once | INTRAVENOUS | Status: AC | PRN
  Administered 2017-01-03: 500 [IU]
  Filled 2017-01-03: qty 5

## 2017-01-03 MED ORDER — FAMOTIDINE IN NACL 20-0.9 MG/50ML-% IV SOLN
20.0000 mg | Freq: Once | INTRAVENOUS | Status: AC
  Administered 2017-01-03: 20 mg via INTRAVENOUS

## 2017-01-03 MED ORDER — DEXAMETHASONE SODIUM PHOSPHATE 10 MG/ML IJ SOLN
5.0000 mg | Freq: Once | INTRAMUSCULAR | Status: AC
  Administered 2017-01-03: 5 mg via INTRAVENOUS

## 2017-01-03 MED ORDER — SODIUM CHLORIDE 0.9 % IV SOLN
10.5000 mg/kg | Freq: Once | INTRAVENOUS | Status: AC
  Administered 2017-01-03: 900 mg via INTRAVENOUS
  Filled 2017-01-03: qty 32

## 2017-01-03 MED ORDER — SODIUM CHLORIDE 0.9% FLUSH
10.0000 mL | INTRAVENOUS | Status: DC | PRN
  Administered 2017-01-03: 10 mL
  Filled 2017-01-03: qty 10

## 2017-01-03 MED ORDER — PACLITAXEL CHEMO INJECTION 300 MG/50ML
80.0000 mg/m2 | Freq: Once | INTRAVENOUS | Status: AC
  Administered 2017-01-03: 150 mg via INTRAVENOUS
  Filled 2017-01-03: qty 25

## 2017-01-03 MED ORDER — DEXAMETHASONE SODIUM PHOSPHATE 10 MG/ML IJ SOLN
INTRAMUSCULAR | Status: AC
  Filled 2017-01-03: qty 1

## 2017-01-03 MED ORDER — SODIUM CHLORIDE 0.9 % IV SOLN
Freq: Once | INTRAVENOUS | Status: AC
  Administered 2017-01-03: 11:00:00 via INTRAVENOUS

## 2017-01-03 MED ORDER — FAMOTIDINE IN NACL 20-0.9 MG/50ML-% IV SOLN
INTRAVENOUS | Status: AC
  Filled 2017-01-03: qty 50

## 2017-01-03 MED ORDER — DIPHENHYDRAMINE HCL 50 MG/ML IJ SOLN
INTRAMUSCULAR | Status: AC
  Filled 2017-01-03: qty 1

## 2017-01-03 MED ORDER — DIPHENHYDRAMINE HCL 50 MG/ML IJ SOLN
25.0000 mg | Freq: Once | INTRAMUSCULAR | Status: AC
  Administered 2017-01-03: 25 mg via INTRAVENOUS

## 2017-01-03 MED ORDER — SODIUM CHLORIDE 0.9 % IJ SOLN
10.0000 mL | INTRAMUSCULAR | Status: DC | PRN
  Administered 2017-01-03: 10 mL via INTRAVENOUS
  Filled 2017-01-03: qty 10

## 2017-01-03 NOTE — Telephone Encounter (Signed)
Per patient, will see Lattie Haw on 02/01/17. Appointments scheduled per 01/03/17 los. Patient was given a copy of the AVS report and appointment schedule, per 01/03/17 los.

## 2017-01-03 NOTE — Progress Notes (Signed)
Cardiology Office Note    Date:  01/04/2017  ID:  Lynn Ramirez, DOB Nov 27, 1940, MRN [id number] PCP:  Midge Minium, MD  Cardiologist:  Dr. Meda Coffee   Chief Complaint: f/u coronary calcium  History of Present Illness:  Lynn Ramirez is a 76 y.o. 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 She has had prior malignant right pleural effusion c/w GYN primary, thus has been treated by oncology for metastatic ovarian cancer and peritoneal carcinomatosis. She has undergone laparoscopic hysterectomy with bilateral salpingoophorectomy, omentectomy, radical tumor debulking and is now treated with Taxol and Avastin. She has been followed by Dr. Meda Coffee for her history of abnormal calcifications seen on a CT scan in 2016. Lexiscan nuclear stress test 04/2015 did not show any significant ischemia, only fixed defect in apex location and breat attenuation. She also has had issues with HTN while on Avastin. 2D echo 10/2016 showed EF 55-60%, grade 1 DD, indeterminate LV filling pressure, normal IVC - Dr. Meda Coffee recommended repeat in 3 months with strain. Most recent labs 01/03/17 showed K 4.4, glucose 119, BUN 20.9, CR 1.1 (appears near baseline - c/w CKD stage II-III), albumin 3.2, WBC 3.7 (persistently low since May), Hgb 11.7, plt 222; recent LDL 78 on 12/25/16.  She returns for follow-up today overall feeling well from a cardiac standpoint. Chronic dyspnea is stable - she does not typically get short of breath with exertion but states she uses PRN O2 because her O2 level drops (this is chronic for her). No chest pain, palpitations, syncope. Has good days and bad days depending on chemo schedule with leg pain/weakness, but currently feels well. Continues to enjoy time traveling with family and  has a sunny outlook overall. States that BP overall controlled and no further headaches.   Past Medical History:  Diagnosis Date  . CKD (chronic kidney disease), stage II   . COPD (chronic obstructive pulmonary disease) ([place])   . Coronary artery calcification seen on CT scan   . Difficulty sleeping   . Eczema    hands  . Emphysema   . GERD (gastroesophageal reflux disease)   . H/O hydronephrosis   . History of transfusion    age 48  . Hyperlipidemia   . Hypertension   . Ovarian cancer ([place])    metastatic - prior malignant R pleural effusion and peritoneal carcinomatosis  . S/p nephrectomy     Past Surgical History:  Procedure Laterality Date  . ABDOMINAL HYSTERECTOMY    . CHOLECYSTECTOMY N/A 08/09/2015   Procedure: Attempted LAPAROSCOPIC coverted  open CHOLECYSTECTOMY WITH INTRAOPERATIVE CHOLANGIOGRAM;  Surgeon: Autumn Messing III, MD;  Location: [facility];  Service: General;  Laterality: N/A;  . HAND SURGERY Right 1980's  . LAPAROTOMY N/A 12/29/2014   Procedure:  LAPAROTOMY;  Surgeon: Everitt Amber, MD;  Location: [facility];  Service: Gynecology;  Laterality: N/A;  . NEPHRECTOMY    . ROBOTIC ASSISTED TOTAL HYSTERECTOMY WITH BILATERAL SALPINGO OOPHERECTOMY Bilateral 12/29/2014   Procedure: ROBOTIC ASSISTED TOTAL LAPAROSCOPIC HYSTERECTOMY WITH BILATERAL SALPINGO OOPHORECTOMY AND OOMENTECTOMY WITH RADICAL TUMOR [place] ;  Surgeon: Everitt Amber, MD;  Location: [facility];  Service: Gynecology;  Laterality: Bilateral;  . THORACENTESIS     several  . TONSILLECTOMY    . TUBAL LIGATION      Current Medications: Current Meds  Medication  Sig  . Acetaminophen (TYLENOL) 325 MG CAPS Take 1 tablet by mouth 3 (three) times daily as needed (pain).   Marland Kitchen albuterol (PROVENTIL) (2.5 MG/3ML) 0.083% nebulizer solution Take 3 mLs (2.5 mg total) by nebulization every 6 (six) hours as needed for wheezing or shortness of breath.  Marland Kitchen antiseptic oral rinse (BIOTENE) LIQD 15 mLs by Mouth Rinse route 3 (three) times daily.   Marland Kitchen atorvastatin (LIPITOR) 10 MG tablet TAKE 1 TABLET(10 MG) BY MOUTH DAILY  . Biotin 2500 MCG CAPS Take 1 tablet by mouth daily.  . carvedilol (COREG) 3.125 MG tablet Take 1 tablet (3.125 mg total) by mouth 2 (two) times daily with a meal.  . cholecalciferol (VITAMIN D) 1000 units tablet Take 1,000 Units by mouth daily.  . diphenoxylate-atropine (LOMOTIL) 2.5-0.025 MG tablet TAKE 2 TABLETS BY MOUTH FOUR TIMES DAILY AS NEEDED FOR DIARRHEA OR LOOSE STOOLS  . docusate sodium (COLACE) 100 MG capsule Take 1 capsule (100 mg total) by mouth 2 (two) times daily.  . famotidine (PEPCID) 10 MG tablet Take 20 mg by mouth daily.   . fluocinonide cream (LIDEX) 4.85 % Apply 1 application topically 2 (two) times daily as needed (For ezcema).   . gabapentin (NEURONTIN) 300 MG capsule TAKE ONE CAPSULE BY MOUTH AT BEDTIME  . lidocaine-prilocaine (EMLA) cream Apply to port site one hour prior to use. Do not rub in. Cover with plastic.  Marland Kitchen loperamide (IMODIUM) 2 MG capsule Take by mouth as needed for diarrhea or loose stools.  Marland Kitchen loratadine (CLARITIN) 10 MG tablet Take 10 mg by mouth daily.  . Omega-3 Fatty Acids (OMEGA-3 PO) Take 1 capsule by mouth daily.  . ondansetron (ZOFRAN-ODT) 4 MG disintegrating tablet Take 1 tablet (4 mg total) by mouth every 8 (eight) hours as needed for nausea or vomiting.  Marland Kitchen oxyCODONE-acetaminophen (PERCOCET/ROXICET) 5-325 MG tablet Take 1 tablet by mouth every 6 (six) hours as needed for severe pain.  . polyethylene glycol (MIRALAX / GLYCOLAX) packet Take 17 g by mouth daily.  Marland Kitchen PROAIR HFA 108 (90 Base) MCG/ACT inhaler INHALE 2 PUFFS INTO THE LUNGS FOUR TIMES DAILY AS NEEDED FOR WHEEZING  . senna (SENOKOT) 8.6 MG TABS tablet Take 2 tablets (17.2 mg total) by mouth daily.  . Tiotropium Bromide-Olodaterol (STIOLTO RESPIMAT) 2.5-2.5 MCG/ACT AERS Inhale 2 puffs into the lungs daily.  . traMADol (ULTRAM) 50 MG tablet TAKE 1 TABLET BY MOUTH EVERY 12 HOURS AS NEEDED FOR PAIN  .  triamterene-hydrochlorothiazide (MAXZIDE-25) 37.5-25 MG tablet Take 0.5 tablets by mouth daily.   . vitamin B-12 (CYANOCOBALAMIN) 100 MCG tablet Take 100 mcg by mouth daily. Reported on 01/19/2016  . zolpidem (AMBIEN) 5 MG tablet Take 1 tablet (5 mg total) by mouth at bedtime as needed for sleep.     Allergies:   Latex and Penicillins   Social History   Social History  . Marital status: Married    Spouse name: N/A  . Number of children: 3  . Years of education: N/A   Occupational History  . RETIRED Retired   Social History Main Topics  . Smoking status: Former Smoker    Packs/day: 1.00    Years: 40.00    Types: Cigarettes    Quit date: 07/10/2013  . Smokeless tobacco: Never Used  . Alcohol use 0.0 oz/week     Comment: 1-2 a week  . Drug use: No  . Sexual activity: Not Asked   Other Topics Concern  . None   Social History Narrative  .  None     Family History:  Family History  Problem Relation Age of Onset  . Diabetes Father   . Lung cancer Father 37       metastasis to liver  . Cancer Father        liver  . Pancreatic cancer Mother 58  . Stroke Paternal Grandfather   . Heart disease Maternal Aunt   . Diabetes Paternal Uncle   . Heart Problems Maternal Grandmother   . Heart Problems Maternal Grandfather   . Diabetes Paternal Grandmother   . Stroke Paternal Grandmother   . Heart Problems Paternal Uncle   . Cancer Cousin        unknown type  . Breast cancer Cousin        dx. 36s  . Leukemia Cousin        dx. 16-17  . Colon cancer Neg Hx   . Stomach cancer Neg Hx     ROS:   Please see the history of present illness. Has rare nosebleeds after chemo but no significant dripping - only minimal blood when wiping that resolves quickly (has been discussed with oncology) All other systems are reviewed and otherwise negative.    PHYSICAL EXAM:   VS:  BP 130/80   Pulse 77   Ht 5\' 1"  (1.549 m)   Wt 194 lb 6.4 oz (88.2 kg)   SpO2 93%   BMI 36.73 kg/m   BMI:  Body mass index is 36.73 kg/m. GEN: Well nourished, well developed obese WF, in no acute distress  HEENT: normocephalic, atraumatic Neck: no JVD, carotid bruits, or masses Cardiac: RRR; no murmurs, rubs, or gallops, no edema  Respiratory:  Diffusely diminished but no wheezing, rales or rhonchi, normal work of breathing GI: soft, nontender, nondistended, + BS MS: no deformity or atrophy  Skin: warm and dry, no rash Neuro:  Alert and Oriented x 3, Strength and sensation are intact, follows commands Psych: euthymic mood, full affect  Wt Readings from Last 3 Encounters:  01/04/17 194 lb 6.4 oz (88.2 kg)  01/03/17 194 lb 14.4 oz (88.4 kg)  12/20/16 193 lb 8 oz (87.8 kg)      Studies/Labs Reviewed:   EKG:  EKG was ordered today and personally reviewed by me and demonstrates NSR 77bpm, rightward axis, no acute ST-Tchanges  Recent Labs: 03/15/2016: TSH 2.46 08/07/2016: Magnesium 1.9 10/18/2016: B Natriuretic Peptide 32.5 01/03/2017: ALT 18; BUN 20.9; Creatinine 1.1; HGB 11.7; Platelets 222; Potassium 4.4; Sodium 143   Lipid Panel    Component Value Date/Time   CHOL 135 12/25/2016 0957   TRIG 119 12/25/2016 0957   HDL 33 (L) 12/25/2016 0957   CHOLHDL 4.1 12/25/2016 0957   CHOLHDL 3.6 01/03/2016 0831   VLDL 34 (H) 01/03/2016 0831   LDLCALC 78 12/25/2016 0957    Additional studies/ records that were reviewed today include: Summarized above    ASSESSMENT & PLAN:   1. Coronary calcification by CT - no recent anginal symptoms. Lipids controlled. Prior low risk nuc. Continue surveillance for symptoms. 2. Essential HTN - generally controlled. New recs suggest goal BP closer to 120/80 but given patient's age and weakness after chemotherapy, I think 130/80 is acceptable. Her diastolics tend to run in the 50s at times as well, thus I will not make any med changes at this time. I asked her to notify our office if she begins to notice this creeping up higher than 595 systolic regularly at  which time would consider titrating carvedilol if  pulm status would allow. 3. Hyperlipidemia - recent lipid panel showed good control. Continue statin. Does not appear to be having any adverse effects. 4. Ovarian cancer with metastasis currently on chemotherapy with Avastin and Taxol - Dr. Meda Coffee had previously obtained echo several months ago which was stable, but recommended f/u echo 01/2017 with strain to reassess. This has already been scheduled.  Disposition: F/u with Dr. Meda Coffee in 1 year, sooner if echo is abnormal.   Medication Adjustments/Labs and Tests Ordered: Current medicines are reviewed at length with the patient today.  Concerns regarding medicines are outlined above. Medication changes, Labs and Tests ordered today are summarized above and listed in the Patient Instructions accessible in Encounters.   Signed, Charlie Pitter, PA-C  01/04/2017 10:09 AM    [facility] [address], [place], [place]  [address] Phone: [phone]; Fax: [phone]

## 2017-01-03 NOTE — Patient Instructions (Signed)
Implanted Port Home Guide An implanted port is a type of central line that is placed under the skin. Central lines are used to provide IV access when treatment or nutrition needs to be given through a person's veins. Implanted ports are used for long-term IV access. An implanted port may be placed because:  You need IV medicine that would be irritating to the small veins in your hands or arms.  You need long-term IV medicines, such as antibiotics.  You need IV nutrition for a long period.  You need frequent blood draws for lab tests.  You need dialysis.  Implanted ports are usually placed in the chest area, but they can also be placed in the upper arm, the abdomen, or the leg. An implanted port has two main parts:  Reservoir. The reservoir is round and will appear as a small, raised area under your skin. The reservoir is the part where a needle is inserted to give medicines or draw blood.  Catheter. The catheter is a thin, flexible tube that extends from the reservoir. The catheter is placed into a large vein. Medicine that is inserted into the reservoir goes into the catheter and then into the vein.  How will I care for my incision site? Do not get the incision site wet. Bathe or shower as directed by your health care provider. How is my port accessed? Special steps must be taken to access the port:  Before the port is accessed, a numbing cream can be placed on the skin. This helps numb the skin over the port site.  Your health care provider uses a sterile technique to access the port. ? Your health care provider must put on a mask and sterile gloves. ? The skin over your port is cleaned carefully with an antiseptic and allowed to dry. ? The port is gently pinched between sterile gloves, and a needle is inserted into the port.  Only "non-coring" port needles should be used to access the port. Once the port is accessed, a blood return should be checked. This helps ensure that the port  is in the vein and is not clogged.  If your port needs to remain accessed for a constant infusion, a clear (transparent) bandage will be placed over the needle site. The bandage and needle will need to be changed every week, or as directed by your health care provider.  Keep the bandage covering the needle clean and dry. Do not get it wet. Follow your health care provider's instructions on how to take a shower or bath while the port is accessed.  If your port does not need to stay accessed, no bandage is needed over the port.  What is flushing? Flushing helps keep the port from getting clogged. Follow your health care provider's instructions on how and when to flush the port. Ports are usually flushed with saline solution or a medicine called heparin. The need for flushing will depend on how the port is used.  If the port is used for intermittent medicines or blood draws, the port will need to be flushed: ? After medicines have been given. ? After blood has been drawn. ? As part of routine maintenance.  If a constant infusion is running, the port may not need to be flushed.  How long will my port stay implanted? The port can stay in for as long as your health care provider thinks it is needed. When it is time for the port to come out, surgery will be   done to remove it. The procedure is similar to the one performed when the port was put in. When should I seek immediate medical care? When you have an implanted port, you should seek immediate medical care if:  You notice a bad smell coming from the incision site.  You have swelling, redness, or drainage at the incision site.  You have more swelling or pain at the port site or the surrounding area.  You have a fever that is not controlled with medicine.  This information is not intended to replace advice given to you by your health care provider. Make sure you discuss any questions you have with your health care provider. Document  Released: 06/26/2005 Document Revised: 12/02/2015 Document Reviewed: 03/03/2013 Elsevier Interactive Patient Education  2017 Elsevier Inc.  

## 2017-01-03 NOTE — Progress Notes (Signed)
Lynn Ramirez   Diagnosis:  Ovarian cancer  INTERVAL HISTORY:   Lynn Ramirez returns as scheduled. She completed another cycle of Taxol/Avastin 12/20/2016. She denies nausea/vomiting. No mouth sores. No diarrhea. Neuropathy symptoms are stable. She continues to have bilateral knee pain. She recently had "injections". She notes the knee pain is somewhat better. She went to the gym earlier this week and utilized a "stair stepper". She now notes that her legs are "sore". She has occasional blood with nose blowing. No other bleeding. She denies chest pain. No leg swelling. Breathing overall is doing better. She now has nebulizers at home. She has had a few episodes of urinary urgency. She denies hematuria and dysuria.  Objective:  Vital signs in last 24 hours:  Blood pressure (!) 131/51, pulse 71, temperature 97.8 F (36.6 C), temperature source Oral, resp. rate 18, height 5\' 1"  (1.549 m), weight 194 lb 14.4 oz (88.4 kg), SpO2 99 %.    HEENT: No thrush or ulcers. Resp: Distant breath sounds. No wheezes. No respiratory distress. Cardio: Regular rate and rhythm. GI: Abdomen soft and nontender. Abdomen does not appear distended. No hepatomegaly. No mass. Vascular: No leg edema. Port-A-Cath without erythema.  Lab Results:  Lab Results  Component Value Date   WBC 3.7 (L) 01/03/2017   HGB 11.7 01/03/2017   HCT 36.1 01/03/2017   MCV 93.9 01/03/2017   PLT 222 01/03/2017   NEUTROABS 2.1 01/03/2017    Imaging:  No results found.  Medications: I have reviewed the patient's current medications.  Assessment/Plan: 1. 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, immunohistochemical profile consistent with a GYN primary, elevated CA 125   Staging CTs of the chest, abdomen, and  pelvis on 10/06/2014 revealed a loculated right pleural effusion, ascites, and omental/mesenteric thickening   Cytology from peritoneal fluid 10/13/2014 revealed malignant cells consistent with metastatic adenocarcinoma Biopsy of an omental mass on 11/02/2014 revealed invasive serous carcinoma with psammoma bodies Cycle 1 Taxol/carboplatin 10/28/2014 Cycle 2 Taxol/carboplatin 11/18/2014 Cycle 3 Taxol/carboplatin 12/09/2014  CT scan 12/23/2014 with interval improvement in peritoneal carcinomatosis with near-complete resolution of ascites and decreased omental nodularity. Significant improvement in malignant right pleural effusion.  Status post robotic-assisted laparoscopic hysterectomy with bilateral salpingoophorectomy, omentectomy, radical tumor debulking 12/29/2014. Per Dr. Serita Grit office Ramirez 01/14/2015 cytoreduction was optimal with residual disease remaining only in the 1 mm implants on the small intestine. 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 Cycle 1 adjuvant Taxol/carboplatin 01/20/2015 Cycle 2 adjuvant Taxol/carboplatin 02/10/2015 Cycle 3 adjuvant Taxol/carboplatin 03/10/2015 CA 125 on 1013 2016-42  CTs of the chest, abdomen, and pelvis 05/31/2015 with no evidence of progressive ovarian cancer  CTs of the chest, abdomen, and pelvis 08/07/2015 and 08/08/2015-no evidence of progressive ovarian cancer  Peritoneal studding noted at the time of the cholecystectomy procedure 08/09/2015 Elevated CA 125 10/07/2015  CT 10/07/2015 with stricturing at the sigmoid colon, constipation, and omental nodularity  Initiation of salvage weekly Taxol chemotherapy 10/13/2015  Taxol changed to every 2 weeks  beginning 01/19/2016 due to painful neuropathy.  CT scans 07/19/2016 with no acute findings. No features in the abdomen or pelvis to suggest recurrent disease.Stable mild fullness right intrarenal collecting system.  Elevated CA 125 treatment resumed with Taxol/Avastin on a 2 week schedule  09/27/2016 2. COPD-followed by Dr. Lamonte Sakai. 3. Dyspnea secondary to COPD and the large right pleural effusion, status post therapeutic thoracentesis procedures 09/30/2014,10/09/2014, and 10/21/2014. Left thoracentesis 10/30/2014 4. Left nephrectomy as a child 5. Delayed nausea following cycle 1 Taxol/carboplatin, Aloxi/Emend added with cycle 2 with improvement. 6. Right lower extremity edema, right calf pain 12/09/2014. Negative venous Doppler 12/09/2014. 7. Diffuse pruritus following cycle 1 adjuvant Taxol/carboplatin 01/20/2015, no rash, resolved with a steroid dose pack 8. Thrombocytopenia second to chemotherapy, the carboplatin was dose reduced with cycle 2 adjuvant Taxol/carboplatin 02/10/2015 9. Chemotherapy-induced peripheral neuropathy-painful peripheral neuropathy involving the feet 01/19/2016.  10. Admission with acute cholecystitis 08/07/2015, status post a cholecystectomy 08/09/2015 11. Admission 10/08/2015 with abdominal pain/constipation, improved with bowel rest and laxatives 12. Mild right hydronephrosis noted on the CT 10/07/2015. Renal ultrasound 12/16/2015 with no hydronephrosis noted.No hydronephrosis on CT 07/19/2016.   Disposition: Lynn Ramirez appears unchanged. Most recent CA-125 was stable. Plan to continue every 2 week Taxol/Avastin.  She has had a few episodes of urinary urgency. We will check a urinalysis today.  She will return for a follow-up visit and Taxol/Avastin in 2 weeks. She will contact the office in the interim with any problems.    Ned Card ANP/GNP-BC   01/03/2017  10:58 AM

## 2017-01-04 ENCOUNTER — Ambulatory Visit (INDEPENDENT_AMBULATORY_CARE_PROVIDER_SITE_OTHER): Payer: Medicare Other | Admitting: Physician Assistant

## 2017-01-04 ENCOUNTER — Ambulatory Visit: Payer: Medicare Other | Admitting: Cardiology

## 2017-01-04 ENCOUNTER — Encounter: Payer: Self-pay | Admitting: Emergency Medicine

## 2017-01-04 ENCOUNTER — Other Ambulatory Visit: Payer: Self-pay | Admitting: *Deleted

## 2017-01-04 ENCOUNTER — Encounter: Payer: Self-pay | Admitting: Physician Assistant

## 2017-01-04 ENCOUNTER — Ambulatory Visit (INDEPENDENT_AMBULATORY_CARE_PROVIDER_SITE_OTHER): Payer: Medicare Other | Admitting: Emergency Medicine

## 2017-01-04 VITALS — BP 130/80 | HR 77 | Ht 61.0 in | Wt 194.4 lb

## 2017-01-04 DIAGNOSIS — R300 Dysuria: Secondary | ICD-10-CM

## 2017-01-04 DIAGNOSIS — E785 Hyperlipidemia, unspecified: Secondary | ICD-10-CM

## 2017-01-04 DIAGNOSIS — J301 Allergic rhinitis due to pollen: Secondary | ICD-10-CM

## 2017-01-04 DIAGNOSIS — I2510 Atherosclerotic heart disease of native coronary artery without angina pectoris: Secondary | ICD-10-CM

## 2017-01-04 DIAGNOSIS — J441 Chronic obstructive pulmonary disease with (acute) exacerbation: Secondary | ICD-10-CM

## 2017-01-04 DIAGNOSIS — I10 Essential (primary) hypertension: Secondary | ICD-10-CM | POA: Diagnosis not present

## 2017-01-04 DIAGNOSIS — Z79899 Other long term (current) drug therapy: Secondary | ICD-10-CM

## 2017-01-04 LAB — CA 125: Cancer Antigen (CA) 125: 259.1 U/mL — ABNORMAL HIGH (ref 0.0–38.1)

## 2017-01-04 MED ORDER — TIOTROPIUM BROMIDE-OLODATEROL 2.5-2.5 MCG/ACT IN AERS: 2.0000 | INHALATION_SPRAY | Freq: Every day | RESPIRATORY_TRACT | 0 refills | Status: DC

## 2017-01-04 NOTE — Progress Notes (Signed)
Subjective:    Patient ID: Lynn Ramirez, female    DOB: [birth year], 76 y.o.   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 History is taken from the patient and from her husband. She completed chemo August, is following with Dr Benay Spice, Dr Denman George. She has been tired, is having some neuropathy. Her breathing is stable. She is on spiriva and symbicort. Uses SABA rarely. She is interested in doing a trial off of spiriva. I personally reviewed her pulmonary function testing from 05/06/2012. This showed severe obstructive lung disease without bronchodilator response.   ROV 05/06/15 -- follow-up visit for COPD and pleural effusions in the setting of serous carcinoma on chemotherapy. At her last visit we tried stopping Spiriva to see if she would tolerate this. She doesn't notice any difference in her breathing off this. Her chemo ended 03/10/15. She still has periods of severe fatigue that can influence her breathing. She remains on Symbicort. She wants the flu shot and Prevnar-13, has now been off chemo x 7 weeks. She underwent cardiac stress testing 10/11 that was reassuring. Also tested her LE perfusion > no abnormality.   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 She was recently started on Taxol as salvage chemotherapy. She is on Symbicort, was started back on Spiriva in mid December and believes that she has probably benefited. Symbicort seems to do the most for her. She does get fatigued w chemo, is associated with some dyspnea. She is able to go to the [facility] and exercise. She rarely uses albuterol. She does not cough often, she does have a hoarse voice recently due to allergies. She is on loratadine now.   ROV 04/04/16 -- this follow-up visit for history of COPD and bilateral pleural effusions, resolved. She  also has a history of ovarian/fallopian carcinoma being treated with chemotherapy. Had difficulty w neuropathy on taxol.   At her last visit in April we did a trial off of Spiriva to see if she would miss this. She continued Symbicort at that time. She was able to tolerate coming off the Spiriva, no significant clinical change. Not needing her SABA. She is planning to go to [facility] in October.   ROV 10/05/16 -- visit for patient with a history of COPD, hx pleural effusions as detailed above. Her current BD is Symbicort. Her last CXR was 09/04/16 that I have reviewed - no pleural fluid. She is having more dyspnea on exertion. She restarted chemotherapy in march for progression of her fallopian CA. She uses albuterol 1-3x a day. She is on RA. Has not noticed any desats at home.   ROV 10/19/16 -- follow up visit for COPD, hx ovarian CA w pleural effusions. She went to the ED with dyspnea 4/11. She developed diarrhea (has been a problem since she has been back on chemo), had some dyspnea in the evening that responded to SABA. Then she awakened from sleep w dyspnea. In the ED she received nebs, was started on steroids. She is on Stiolto. She is having some nasal congestion, dry nose from her O2. She feels better.   ROV 01/04/17 -- patient has a history of COPD, also ovarian cancer for which she's being treated with Taxol/Avastin (last 12/20/16). Currently managed with Stiolto, albuterol 2x a day. She has intermittent dyspnea, may relate to being outside and exposed  to allergens. Has some hoarseness and chest tightness at those times. Oxygen on 2L/min. On loratadine. CXR 12/20/16 reviewed by me > no infiltrates. Treated with pred for possible bronchospasm twice since I last saw her.    Review of Systems  As per HPI     Objective:   Physical Exam Vitals:   01/04/17 1428  BP: 120/76  Pulse: 76  SpO2: 94%  Weight: 195 lb (88.5 kg)  Height: 5\' 1"  (1.549 m)   Gen: Pleasant, well-nourished, in no distress,   normal affect  ENT: No lesions,  mouth clear,  oropharynx clear, no postnasal drip  Neck: No JVD, no TMG, no carotid bruits  Lungs: No use of accessory muscles, small breaths, clear without rales or rhonchi  Cardiovascular: RRR, heart sounds normal, no murmur or gallops, no peripheral edema  Musculoskeletal: No deformities, no cyanosis or clubbing  Neuro: alert, non focal  Skin: Warm, no lesions or rashes      Assessment & Plan:  COPD (chronic obstructive pulmonary disease) (HCC) Please continue Stiolto once a day Use albuterol inhaler 2 puffs as needed, or use nebulizer as needed for shortness of breath, chest tightness Oxygen at 2 L/m at all times Follow with Dr Lamonte Sakai in 4 months or sooner if you have any problems.  Allergic rhinitis Continue loratadine once a day Try using chlorpheniramine 4mg  (OTC) up to every 6 hours for congestion and hoarse voice.  If your congestion and hoarseness persist please call our office. You may benefit from starting a nasal spray  Baltazar Apo, MD, PhD 01/04/2017, 3:01 PM [facility] [phone] or if no answer [phone]

## 2017-01-04 NOTE — Assessment & Plan Note (Signed)
Continue loratadine once a day Try using chlorpheniramine 4mg  (OTC) up to every 6 hours for congestion and hoarse voice.  If your congestion and hoarseness persist please call our office. You may benefit from starting a nasal spray

## 2017-01-04 NOTE — Patient Instructions (Addendum)
Medication Instructions:  Your physician recommends that you continue on your current medications as directed. Please refer to the Current Medication list given to you today.  Labwork: None ordered  Testing/Procedures: None ordered  Follow-Up: Your physician wants you to follow-up in: [place] will receive a reminder letter in the mail two months in advance. If you don't receive a letter, please call our office to schedule the follow-up appointment.    Any Other Special Instructions Will Be Listed Below (If Applicable). CALL us IF YOUR BLOOD PRESSURE STARTS TO RUN HIGHER THAN 130'S ON THE TOP #  If you need a refill on your cardiac medications before your next appointment, please call your pharmacy.

## 2017-01-04 NOTE — Patient Instructions (Signed)
Please continue Stiolto once a day Use albuterol inhaler 2 puffs as needed, or use nebulizer as needed for shortness of breath, chest tightness Oxygen at 2 L/m at all times Continue loratadine once a day Try using chlorpheniramine 4mg  (OTC) up to every 6 hours for congestion and hoarse voice.  If your congestion and hoarseness persist please call our office. You may benefit from starting a nasal spray Follow with Dr Lamonte Sakai in 4 months or sooner if you have any problems.

## 2017-01-04 NOTE — Assessment & Plan Note (Signed)
Please continue Stiolto once a day Use albuterol inhaler 2 puffs as needed, or use nebulizer as needed for shortness of breath, chest tightness Oxygen at 2 L/m at all times Follow with Dr Lamonte Sakai in 4 months or sooner if you have any problems.

## 2017-01-05 LAB — URINE CULTURE

## 2017-01-08 ENCOUNTER — Telehealth: Payer: Self-pay | Admitting: *Deleted

## 2017-01-08 NOTE — Telephone Encounter (Signed)
-----   Message from [name], NP sent at 01/08/2017  9:07 AM EDT ----- Please let her know the urine culture is negative.

## 2017-01-08 NOTE — Telephone Encounter (Signed)
Telephone call to patient to advise lab results. Patient reports she understands, urgency has improved some. Patient will follow up with [place] if symptoms do not continue to improve.

## 2017-01-14 ENCOUNTER — Other Ambulatory Visit: Payer: Self-pay | Admitting: Oncology

## 2017-01-15 ENCOUNTER — Ambulatory Visit (INDEPENDENT_AMBULATORY_CARE_PROVIDER_SITE_OTHER): Payer: Medicare Other | Admitting: Emergency Medicine

## 2017-01-15 ENCOUNTER — Encounter: Payer: Self-pay | Admitting: Emergency Medicine

## 2017-01-15 ENCOUNTER — Telehealth: Payer: Self-pay | Admitting: Emergency Medicine

## 2017-01-15 DIAGNOSIS — R05 Cough: Secondary | ICD-10-CM | POA: Diagnosis not present

## 2017-01-15 DIAGNOSIS — I2510 Atherosclerotic heart disease of native coronary artery without angina pectoris: Secondary | ICD-10-CM

## 2017-01-15 DIAGNOSIS — R053 Chronic cough: Secondary | ICD-10-CM | POA: Insufficient documentation

## 2017-01-15 MED ORDER — PREDNISONE 10 MG PO TABS: ORAL_TABLET | ORAL | 0 refills | Status: DC

## 2017-01-15 MED ORDER — BENZONATATE 100 MG PO CAPS: 100.0000 mg | ORAL_CAPSULE | Freq: Four times a day (QID) | ORAL | 1 refills | Status: DC | PRN

## 2017-01-15 NOTE — Patient Instructions (Addendum)
Please take prednisone as directed until completely gone. Make sure that you notify Oncology about being on this medication in preparation for your chemotherapy this week. Continue loratadine and chlorpheniramine as you are taking them Stop Pepcid for now Start Nexium 40 mg twice a day for 2 weeks, then decrease to once a day until our next visit. Take either 1 hour before or 1 hour after eating if possible Continue Stiolto once a day Use your albuterol nebulizer or inhaler up to every 6 hours if needed for shortness of breath Continue your oxygen as ordered.  Follow with Lynn Ramirez in 3 months or sooner if you have any problems.

## 2017-01-15 NOTE — Telephone Encounter (Signed)
Spoke with pt. She has been scheduled with RB today at 4:30pm. Nothing further was needed.

## 2017-01-15 NOTE — Progress Notes (Signed)
Subjective:    Patient ID: Lynn Ramirez, female    DOB: 05-29-1941, 76 y.o.   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 History is taken from the patient and from her husband. She completed chemo August, is following with Dr Benay Spice, Dr Denman George. She has been tired, is having some neuropathy. Her breathing is stable. She is on spiriva and symbicort. Uses SABA rarely. She is interested in doing a trial off of spiriva. I personally reviewed her pulmonary function testing from 05/06/2012. This showed severe obstructive lung disease without bronchodilator response.   ROV 05/06/15 -- follow-up visit for COPD and pleural effusions in the setting of serous carcinoma on chemotherapy. At her last visit we tried stopping Spiriva to see if she would tolerate this. She doesn't notice any difference in her breathing off this. Her chemo ended 03/10/15. She still has periods of severe fatigue that can influence her breathing. She remains on Symbicort. She wants the flu shot and Prevnar-13, has now been off chemo x 7 weeks. She underwent cardiac stress testing 10/11 that was reassuring. Also tested her LE perfusion > no abnormality.   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 She was recently started on Taxol as salvage chemotherapy. She is on Symbicort, was started back on Spiriva in mid December and believes that she has probably benefited. Symbicort seems to do the most for her. She does get fatigued w chemo, is associated with some dyspnea. She is able to go to the [facility] and exercise. She rarely uses albuterol. She does not cough often, she does have a hoarse voice recently due to allergies. She is on loratadine now.   ROV 04/04/16 --  this follow-up visit for history of COPD and bilateral pleural effusions, resolved. She also has a history of ovarian/fallopian carcinoma being treated with chemotherapy. Had difficulty w neuropathy on taxol.   At her last visit in April we did a trial off of Spiriva to see if she would miss this. She continued Symbicort at that time. She was able to tolerate coming off the Spiriva, no significant clinical change. Not needing her SABA. She is planning to go to [facility] in October.   ROV 10/05/16 -- visit for patient with a history of COPD, hx pleural effusions as detailed above. Her current BD is Symbicort. Her last CXR was 09/04/16 that I have reviewed - no pleural fluid. She is having more dyspnea on exertion. She restarted chemotherapy in march for progression of her fallopian CA. She uses albuterol 1-3x a day. She is on RA. Has not noticed any desats at home.   ROV 10/19/16 -- follow up visit for COPD, hx ovarian CA w pleural effusions. She went to the ED with dyspnea 4/11. She developed diarrhea (has been a problem since she has been back on chemo), had some dyspnea in the evening that responded to SABA. Then she awakened from sleep w dyspnea. In the ED she received nebs, was started on steroids. She is on Stiolto. She is having some nasal congestion, dry nose from her O2. She feels better.   ROV 01/04/17 -- patient has a history of COPD, also ovarian cancer for which she's being treated with Taxol/Avastin (last 12/20/16). Currently managed with Stiolto, albuterol 2x a day. She has intermittent dyspnea, may relate to being outside and  exposed to allergens. Has some hoarseness and chest tightness at those times. Oxygen on 2L/min. On loratadine. CXR 12/20/16 reviewed by me > no infiltrates. Treated with pred for possible bronchospasm twice since I last saw her.   Acute OV 01/15/17 -- patient has a history of COPD and immunosuppression due to chemotherapy for her ovarian cancer. She has hypoxemic respiratory  failure on 2 L/m. She has been treated frequently for suspected exacerbations that have been characterized by hoarseness, chest tightness, cough. 2 weeks ago we started chlorpheniramine, continued loratadine. She noted that last week she needed to use her nebulizer more. Then over last several days more cough. Productive of clear.     Review of Systems  As per HPI     Objective:   Physical Exam Vitals:   01/15/17 1626  BP: 122/76  Pulse: 97  SpO2: 91%  Weight: 192 lb 12.8 oz (87.5 kg)  Height: 5\' 1"  (1.549 m)   Gen: Pleasant, well-nourished, in no distress,  normal affect  ENT: No lesions,  mouth clear,  oropharynx clear, no postnasal drip, hoarse voice  Neck: No JVD, mild inspiratory and expiratory stridor  Lungs: No use of accessory muscles, a few soft expiratory wheezes bilaterally  Cardiovascular: RRR, heart sounds normal, no murmur or gallops, no peripheral edema  Musculoskeletal: No deformities, no cyanosis or clubbing  Neuro: alert, non focal  Skin: Warm, no lesions or rashes      Assessment & Plan:  COPD (chronic obstructive pulmonary disease) ([place]) Continue Stiolto. Continue albuterol as needed.  Chronic cough Her current and recent flares have been more consistent with upper airway irritation and cough as opposed to true obstruction. There may be a component of both. I believe she needs to be treated with prednisone taper. We will try to add better treatment for GERD, continue her allergy regimen and her bronchodilators.  Please take prednisone as directed until completely gone. Make sure that you notify Oncology about being on this medication in preparation for your chemotherapy this week. Continue loratadine and chlorpheniramine as you are taking them Stop Pepcid for now Start Nexium 40 mg twice a day for 2 weeks, then decrease to once a day until our next visit. Take either 1 hour before or 1 hour after eating if possible Continue Stiolto once a day Use  your albuterol nebulizer or inhaler up to every 6 hours if needed for shortness of breath Continue your oxygen as ordered.  Follow with Dr Lamonte Sakai in 3 months or sooner if you have any problems.  Baltazar Apo, MD, PhD 01/15/2017, 4:58 PM [facility] [phone] or if no answer [phone]

## 2017-01-15 NOTE — Assessment & Plan Note (Signed)
Continue Stiolto. Continue albuterol as needed.

## 2017-01-15 NOTE — Assessment & Plan Note (Signed)
Her current and recent flares have been more consistent with upper airway irritation and cough as opposed to true obstruction. There may be a component of both. I believe she needs to be treated with prednisone taper. We will try to add better treatment for GERD, continue her allergy regimen and her bronchodilators.  Please take prednisone as directed until completely gone. Make sure that you notify Oncology about being on this medication in preparation for your chemotherapy this week. Continue loratadine and chlorpheniramine as you are taking them Stop Pepcid for now Start Nexium 40 mg twice a day for 2 weeks, then decrease to once a day until our next visit. Take either 1 hour before or 1 hour after eating if possible Continue Stiolto once a day Use your albuterol nebulizer or inhaler up to every 6 hours if needed for shortness of breath Continue your oxygen as ordered.  Follow with Dr Lamonte Sakai in 3 months or sooner if you have any problems.

## 2017-01-15 NOTE — Addendum Note (Signed)
Addended by: Jannette Spanner on: 01/15/2017 05:00 PM   Modules accepted: Orders

## 2017-01-16 ENCOUNTER — Ambulatory Visit (HOSPITAL_COMMUNITY): Payer: Medicare Other | Attending: Cardiology

## 2017-01-16 ENCOUNTER — Other Ambulatory Visit: Payer: Self-pay

## 2017-01-16 DIAGNOSIS — Z6836 Body mass index (BMI) 36.0-36.9, adult: Secondary | ICD-10-CM | POA: Insufficient documentation

## 2017-01-16 DIAGNOSIS — C801 Malignant (primary) neoplasm, unspecified: Secondary | ICD-10-CM

## 2017-01-16 DIAGNOSIS — J449 Chronic obstructive pulmonary disease, unspecified: Secondary | ICD-10-CM | POA: Diagnosis not present

## 2017-01-16 DIAGNOSIS — Z79899 Other long term (current) drug therapy: Secondary | ICD-10-CM | POA: Insufficient documentation

## 2017-01-16 DIAGNOSIS — I10 Essential (primary) hypertension: Secondary | ICD-10-CM | POA: Diagnosis not present

## 2017-01-16 DIAGNOSIS — E669 Obesity, unspecified: Secondary | ICD-10-CM | POA: Diagnosis not present

## 2017-01-17 ENCOUNTER — Ambulatory Visit: Payer: Medicare Other

## 2017-01-17 ENCOUNTER — Ambulatory Visit (HOSPITAL_BASED_OUTPATIENT_CLINIC_OR_DEPARTMENT_OTHER): Payer: Medicare Other

## 2017-01-17 ENCOUNTER — Other Ambulatory Visit (HOSPITAL_BASED_OUTPATIENT_CLINIC_OR_DEPARTMENT_OTHER): Payer: Medicare Other

## 2017-01-17 ENCOUNTER — Telehealth: Payer: Self-pay | Admitting: Nurse Practitioner

## 2017-01-17 ENCOUNTER — Ambulatory Visit (HOSPITAL_BASED_OUTPATIENT_CLINIC_OR_DEPARTMENT_OTHER): Payer: Medicare Other | Admitting: Nurse Practitioner

## 2017-01-17 VITALS — BP 153/74

## 2017-01-17 VITALS — BP 143/54 | HR 69 | Temp 97.8°F | Resp 18 | Ht 61.0 in | Wt 193.4 lb

## 2017-01-17 DIAGNOSIS — J910 Malignant pleural effusion: Secondary | ICD-10-CM | POA: Diagnosis not present

## 2017-01-17 DIAGNOSIS — C482 Malignant neoplasm of peritoneum, unspecified: Secondary | ICD-10-CM

## 2017-01-17 DIAGNOSIS — C7962 Secondary malignant neoplasm of left ovary: Secondary | ICD-10-CM | POA: Diagnosis not present

## 2017-01-17 DIAGNOSIS — Z5112 Encounter for antineoplastic immunotherapy: Secondary | ICD-10-CM

## 2017-01-17 DIAGNOSIS — C5701 Malignant neoplasm of right fallopian tube: Secondary | ICD-10-CM

## 2017-01-17 DIAGNOSIS — C569 Malignant neoplasm of unspecified ovary: Secondary | ICD-10-CM

## 2017-01-17 DIAGNOSIS — C7961 Secondary malignant neoplasm of right ovary: Secondary | ICD-10-CM

## 2017-01-17 DIAGNOSIS — Z5111 Encounter for antineoplastic chemotherapy: Secondary | ICD-10-CM | POA: Diagnosis not present

## 2017-01-17 LAB — CBC WITH DIFFERENTIAL/PLATELET
BASO%: 1.1 % (ref 0.0–2.0)
BASOS ABS: 0.1 10*3/uL (ref 0.0–0.1)
EOS ABS: 0 10*3/uL (ref 0.0–0.5)
EOS%: 0.6 % (ref 0.0–7.0)
HCT: 36.3 % (ref 34.8–46.6)
HEMOGLOBIN: 11.9 g/dL (ref 11.6–15.9)
LYMPH%: 28.9 % (ref 14.0–49.7)
MCH: 30.6 pg (ref 25.1–34.0)
MCHC: 32.6 g/dL (ref 31.5–36.0)
MCV: 93.7 fL (ref 79.5–101.0)
MONO#: 0.7 10*3/uL (ref 0.1–0.9)
MONO%: 13.9 % (ref 0.0–14.0)
NEUT%: 55.5 % (ref 38.4–76.8)
NEUTROS ABS: 2.8 10*3/uL (ref 1.5–6.5)
PLATELETS: 283 10*3/uL (ref 145–400)
RBC: 3.88 10*6/uL (ref 3.70–5.45)
RDW: 19.4 % — AB (ref 11.2–14.5)
WBC: 5 10*3/uL (ref 3.9–10.3)
lymph#: 1.5 10*3/uL (ref 0.9–3.3)

## 2017-01-17 LAB — COMPREHENSIVE METABOLIC PANEL
ALBUMIN: 3.5 g/dL (ref 3.5–5.0)
ALK PHOS: 72 U/L (ref 40–150)
ALT: 12 U/L (ref 0–55)
ANION GAP: 9 meq/L (ref 3–11)
AST: 13 U/L (ref 5–34)
BILIRUBIN TOTAL: 0.46 mg/dL (ref 0.20–1.20)
BUN: 24.5 mg/dL (ref 7.0–26.0)
CO2: 33 mEq/L — ABNORMAL HIGH (ref 22–29)
Calcium: 10 mg/dL (ref 8.4–10.4)
Chloride: 99 mEq/L (ref 98–109)
Creatinine: 1 mg/dL (ref 0.6–1.1)
EGFR: 58 mL/min/{1.73_m2} — AB (ref >90)
Glucose: 105 mg/dl (ref 70–140)
POTASSIUM: 4.3 meq/L (ref 3.5–5.1)
SODIUM: 142 meq/L (ref 136–145)
TOTAL PROTEIN: 7 g/dL (ref 6.4–8.3)

## 2017-01-17 MED ORDER — PACLITAXEL CHEMO INJECTION 300 MG/50ML
80.0000 mg/m2 | Freq: Once | INTRAVENOUS | Status: AC
  Administered 2017-01-17: 150 mg via INTRAVENOUS
  Filled 2017-01-17: qty 25

## 2017-01-17 MED ORDER — HEPARIN SOD (PORK) LOCK FLUSH 100 UNIT/ML IV SOLN
500.0000 [IU] | Freq: Once | INTRAVENOUS | Status: AC | PRN
  Administered 2017-01-17: 500 [IU]
  Filled 2017-01-17: qty 5

## 2017-01-17 MED ORDER — DIPHENHYDRAMINE HCL 50 MG/ML IJ SOLN
25.0000 mg | Freq: Once | INTRAMUSCULAR | Status: AC
Start: 2017-01-17 — End: 2017-01-17
  Administered 2017-01-17: 25 mg via INTRAVENOUS

## 2017-01-17 MED ORDER — DIPHENHYDRAMINE HCL 50 MG/ML IJ SOLN
INTRAMUSCULAR | Status: AC
  Filled 2017-01-17: qty 1

## 2017-01-17 MED ORDER — SODIUM CHLORIDE 0.9 % IV SOLN
10.5000 mg/kg | Freq: Once | INTRAVENOUS | Status: AC
  Administered 2017-01-17: 900 mg via INTRAVENOUS
  Filled 2017-01-17: qty 32

## 2017-01-17 MED ORDER — SODIUM CHLORIDE 0.9% FLUSH
10.0000 mL | INTRAVENOUS | Status: DC | PRN
  Administered 2017-01-17: 10 mL
  Filled 2017-01-17: qty 10

## 2017-01-17 MED ORDER — PALONOSETRON HCL INJECTION 0.25 MG/5ML
INTRAVENOUS | Status: AC
  Filled 2017-01-17: qty 5

## 2017-01-17 MED ORDER — DEXAMETHASONE SODIUM PHOSPHATE 10 MG/ML IJ SOLN
5.0000 mg | Freq: Once | INTRAMUSCULAR | Status: AC
  Administered 2017-01-17: 5 mg via INTRAVENOUS

## 2017-01-17 MED ORDER — FAMOTIDINE IN NACL 20-0.9 MG/50ML-% IV SOLN
20.0000 mg | Freq: Once | INTRAVENOUS | Status: AC
  Administered 2017-01-17: 20 mg via INTRAVENOUS

## 2017-01-17 MED ORDER — DEXAMETHASONE SODIUM PHOSPHATE 10 MG/ML IJ SOLN
INTRAMUSCULAR | Status: AC
  Filled 2017-01-17: qty 1

## 2017-01-17 MED ORDER — FAMOTIDINE IN NACL 20-0.9 MG/50ML-% IV SOLN
INTRAVENOUS | Status: AC
  Filled 2017-01-17: qty 50

## 2017-01-17 MED ORDER — SODIUM CHLORIDE 0.9 % IV SOLN
Freq: Once | INTRAVENOUS | Status: AC
  Administered 2017-01-17: 11:00:00 via INTRAVENOUS

## 2017-01-17 NOTE — Patient Instructions (Signed)
Lynn Ramirez Discharge Instructions for Patients Receiving Chemotherapy  Today you received the following chemotherapy agents:  Taxol and Avastin.  To help prevent nausea and vomiting after your treatment, we encourage you to take your nausea medication as prescribed.   If you develop nausea and vomiting that is not controlled by your nausea medication, call the clinic.   BELOW ARE SYMPTOMS THAT SHOULD BE REPORTED IMMEDIATELY:  *FEVER GREATER THAN 100.5 F  *CHILLS WITH OR WITHOUT FEVER  NAUSEA AND VOMITING THAT IS NOT CONTROLLED WITH YOUR NAUSEA MEDICATION  *UNUSUAL SHORTNESS OF BREATH  *UNUSUAL BRUISING OR BLEEDING  TENDERNESS IN MOUTH AND THROAT WITH OR WITHOUT PRESENCE OF ULCERS  *URINARY PROBLEMS  *BOWEL PROBLEMS  UNUSUAL RASH Items with * indicate a potential emergency and should be followed up as soon as possible.  Feel free to call the clinic you have any questions or concerns. The clinic phone number is [phone].  Please show the [place] at check-in to the Emergency Department and triage nurse.  (Avastin) Bevacizumab What is this medicine? BEVACIZUMAB (be va SIZ yoo mab) is a monoclonal antibody. It is used to treat many types of cancer. This medicine may be used for other purposes; ask your health care provider or pharmacist if you have questions. COMMON BRAND NAME(S): Avastin What should I tell my health care provider before I take this medicine? They need to know if you have any of these conditions: -diabetes -heart disease -high blood pressure -history of coughing up blood -prior anthracycline chemotherapy (e.g., doxorubicin, daunorubicin, epirubicin) -recent or ongoing radiation therapy -recent or planning to have surgery -stroke -an unusual or allergic reaction to bevacizumab, hamster proteins, mouse proteins, other medicines, foods, dyes, or preservatives -pregnant or trying to get pregnant -breast-feeding How should I use  this medicine? This medicine is for infusion into a vein. It is given by a health care professional in a hospital or clinic setting. Talk to your pediatrician regarding the use of this medicine in children. Special care may be needed. Overdosage: If you think you have taken too much of this medicine contact a poison control center or emergency room at once. NOTE: This medicine is only for you. Do not share this medicine with others. What if I miss a dose? It is important not to miss your dose. Call your doctor or health care professional if you are unable to keep an appointment. What may interact with this medicine? Interactions are not expected. This list may not describe all possible interactions. Give your health care provider a list of all the medicines, herbs, non-prescription drugs, or dietary supplements you use. Also tell them if you smoke, drink alcohol, or use illegal drugs. Some items may interact with your medicine. What should I watch for while using this medicine? Your condition will be monitored carefully while you are receiving this medicine. You will need important blood work and urine testing done while you are taking this medicine. This medicine may increase your risk to bruise or bleed. Call your doctor or health care professional if you notice any unusual bleeding. This medicine should be started at least 28 days following major surgery and the site of the surgery should be totally healed. Check with your doctor before scheduling dental work or surgery while you are receiving this treatment. Talk to your doctor if you have recently had surgery or if you have a wound that has not healed. Do not become pregnant while taking this medicine or for  6 months after stopping it. Women should inform their doctor if they wish to become pregnant or think they might be pregnant. There is a potential for serious side effects to an unborn child. Talk to your health care professional or pharmacist  for more information. Do not breast-feed an infant while taking this medicine and for 6 months after the last dose. This medicine has caused ovarian failure in some women. This medicine may interfere with the ability to have a child. You should talk to your doctor or health care professional if you are concerned about your fertility. What side effects may I notice from receiving this medicine? Side effects that you should report to your doctor or health care professional as soon as possible: -allergic reactions like skin rash, itching or hives, swelling of the face, lips, or tongue -chest pain or chest tightness -chills -coughing up blood -high fever -seizures -severe constipation -signs and symptoms of bleeding such as bloody or black, tarry stools; red or dark-brown urine; spitting up blood or brown material that looks like coffee grounds; red spots on the skin; unusual bruising or bleeding from the eye, gums, or nose -signs and symptoms of a blood clot such as breathing problems; chest pain; severe, sudden headache; pain, swelling, warmth in the leg -signs and symptoms of a stroke like changes in vision; confusion; trouble speaking or understanding; severe headaches; sudden numbness or weakness of the face, arm or leg; trouble walking; dizziness; loss of balance or coordination -stomach pain -sweating -swelling of legs or ankles -vomiting -weight gain Side effects that usually do not require medical attention (report to your doctor or health care professional if they continue or are bothersome): -back pain -changes in taste -decreased appetite -dry skin -nausea -tiredness This list may not describe all possible side effects. Call your doctor for medical advice about side effects. You may report side effects to FDA at 1-800-FDA-1088. Where should I keep my medicine? This drug is given in a hospital or clinic and will not be stored at home. NOTE: This sheet is a summary. It may not  cover all possible information. If you have questions about this medicine, talk to your doctor, pharmacist, or health care provider.  2018 [employer]vier/Gold Standard (2016-06-23 14:33:29)

## 2017-01-17 NOTE — Telephone Encounter (Signed)
Scheduled appt per 7/11 los - Gave patient AVS and calender per los.  

## 2017-01-17 NOTE — Progress Notes (Signed)
[facility] OFFICE PROGRESS NOTE   Diagnosis:  Ovarian cancer  INTERVAL HISTORY:   Ms. Tischer returns as scheduled. She completed another cycle of Taxol/Avastin 01/03/2017. She denies nausea/vomiting. No mouth sores. No significant diarrhea. No constipation. Stable neuropathy symptoms. No bleeding. No chest pain. No leg swelling or calf pain. Overall good appetite. She continues to have intermittent urinary incontinence with coughing and sneezing. Breathing worsened when she was in [place]. She has seen Dr. Lamonte Sakai and medications were adjusted. She notes improvement. She had an episode of low back pain earlier today.   Objective:  Vital signs in last 24 hours:  Blood pressure (!) 143/54, pulse 69, temperature 97.8 F (36.6 C), temperature source Oral, resp. rate 18, height 5\' 1"  (1.549 m), weight 193 lb 6.4 oz (87.7 kg), SpO2 99 %.    HEENT: No thrush or ulcers. Resp: Distant breath sounds. No wheezing. No respiratory distress. Cardio: Regular rate and rhythm. GI: Abdomen soft. Mild tenderness right upper abdomen. No hepatomegaly. No apparent ascites. Vascular: No leg edema.  Port-A-Cath without erythema.  Lab Results:  Lab Results  Component Value Date   WBC 5.0 01/17/2017   HGB 11.9 01/17/2017   HCT 36.3 01/17/2017   MCV 93.7 01/17/2017   PLT 283 01/17/2017   NEUTROABS 2.8 01/17/2017    Imaging:  No results found.  Medications: I have reviewed the patient's current medications.  Assessment/Plan: 1. 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, immunohistochemical profile consistent with a GYN primary, elevated CA 125   Staging CTs of the chest, abdomen, and pelvis on 10/06/2014 revealed a loculated right pleural effusion, ascites, and omental/mesenteric  thickening   Cytology from peritoneal fluid 10/13/2014 revealed malignant cells consistent with metastatic adenocarcinoma Biopsy of an omental mass on 11/02/2014 revealed invasive serous carcinoma with psammoma bodies Cycle 1 Taxol/carboplatin 10/28/2014 Cycle 2 Taxol/carboplatin 11/18/2014 Cycle 3 Taxol/carboplatin 12/09/2014  CT scan 12/23/2014 with interval improvement in peritoneal carcinomatosis with near-complete resolution of ascites and decreased omental nodularity. Significant improvement in malignant right pleural effusion.  Status post robotic-assisted laparoscopic hysterectomy with bilateral salpingoophorectomy, omentectomy, radical tumor debulking 12/29/2014. Per Dr. Serita Grit office note 01/14/2015 cytoreduction was optimal with residual disease remaining only in the 1 mm implants on the small intestine. 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 Cycle 1 adjuvant Taxol/carboplatin 01/20/2015 Cycle 2 adjuvant Taxol/carboplatin 02/10/2015 Cycle 3 adjuvant Taxol/carboplatin 03/10/2015 CA 125 on 1013 2016-42  CTs of the chest, abdomen, and pelvis 05/31/2015 with no evidence of progressive ovarian cancer  CTs of the chest, abdomen, and pelvis 08/07/2015 and 08/08/2015-no evidence of progressive ovarian cancer  Peritoneal studding noted at the time of the cholecystectomy procedure 08/09/2015 Elevated CA 125 10/07/2015  CT 10/07/2015 with stricturing at the sigmoid colon, constipation, and omental nodularity  Initiation of salvage weekly Taxol chemotherapy 10/13/2015  Taxol changed to every 2 weeks beginning 01/19/2016 due to painful neuropathy.  CT scans 07/19/2016 with no acute findings. No  features in the abdomen or pelvis to suggest recurrent disease.Stable mild fullness right intrarenal collecting system.  Elevated CA 125 treatment resumed with Taxol/Avastin on a 2 week schedule 09/27/2016 2. COPD-followed by Dr. Lamonte Sakai. 3. Dyspnea secondary to COPD and the large right pleural effusion, status post therapeutic thoracentesis procedures 09/30/2014,10/09/2014, and 10/21/2014. Left  thoracentesis 10/30/2014 4. Left nephrectomy as a child 5. Delayed nausea following cycle 1 Taxol/carboplatin, Aloxi/Emend added with cycle 2 with improvement. 6. Right lower extremity edema, right calf pain 12/09/2014. Negative venous Doppler 12/09/2014. 7. Diffuse pruritus following cycle 1 adjuvant Taxol/carboplatin 01/20/2015, no rash, resolved with a steroid dose pack 8. Thrombocytopenia second to chemotherapy, the carboplatin was dose reduced with cycle 2 adjuvant Taxol/carboplatin 02/10/2015 9. Chemotherapy-induced peripheral neuropathy-painful peripheral neuropathy involving the feet 01/19/2016.  10. Admission with acute cholecystitis 08/07/2015, status post a cholecystectomy 08/09/2015 11. Admission 10/08/2015 with abdominal pain/constipation, improved with bowel rest and laxatives 12. Mild right hydronephrosis noted on the CT 10/07/2015. Renal ultrasound 12/16/2015 with no hydronephrosis noted.No hydronephrosis on CT 07/19/2016.    Disposition: Ms. Miranda appears unchanged. Plan to proceed with Taxol/Avastin today as scheduled. She will return for a follow-up visit and Taxol/Avastin in 2 weeks.  She will discuss the intermittent urinary incontinence with Dr. Birdie Riddle.   Plan reviewed with Dr. Benay Spice.    Ned Card ANP/GNP-BC   01/17/2017  9:31 AM

## 2017-01-17 NOTE — Patient Instructions (Signed)
Implanted Port Home Guide An implanted port is a type of central line that is placed under the skin. Central lines are used to provide IV access when treatment or nutrition needs to be given through a person's veins. Implanted ports are used for long-term IV access. An implanted port may be placed because:  You need IV medicine that would be irritating to the small veins in your hands or arms.  You need long-term IV medicines, such as antibiotics.  You need IV nutrition for a long period.  You need frequent blood draws for lab tests.  You need dialysis.  Implanted ports are usually placed in the chest area, but they can also be placed in the upper arm, the abdomen, or the leg. An implanted port has two main parts:  Reservoir. The reservoir is round and will appear as a small, raised area under your skin. The reservoir is the part where a needle is inserted to give medicines or draw blood.  Catheter. The catheter is a thin, flexible tube that extends from the reservoir. The catheter is placed into a large vein. Medicine that is inserted into the reservoir goes into the catheter and then into the vein.  How will I care for my incision site? Do not get the incision site wet. Bathe or shower as directed by your health care provider. How is my port accessed? Special steps must be taken to access the port:  Before the port is accessed, a numbing cream can be placed on the skin. This helps numb the skin over the port site.  Your health care provider uses a sterile technique to access the port. ? Your health care provider must put on a mask and sterile gloves. ? The skin over your port is cleaned carefully with an antiseptic and allowed to dry. ? The port is gently pinched between sterile gloves, and a needle is inserted into the port.  Only "non-coring" port needles should be used to access the port. Once the port is accessed, a blood return should be checked. This helps ensure that the port  is in the vein and is not clogged.  If your port needs to remain accessed for a constant infusion, a clear (transparent) bandage will be placed over the needle site. The bandage and needle will need to be changed every week, or as directed by your health care provider.  Keep the bandage covering the needle clean and dry. Do not get it wet. Follow your health care provider's instructions on how to take a shower or bath while the port is accessed.  If your port does not need to stay accessed, no bandage is needed over the port.  What is flushing? Flushing helps keep the port from getting clogged. Follow your health care provider's instructions on how and when to flush the port. Ports are usually flushed with saline solution or a medicine called heparin. The need for flushing will depend on how the port is used.  If the port is used for intermittent medicines or blood draws, the port will need to be flushed: ? After medicines have been given. ? After blood has been drawn. ? As part of routine maintenance.  If a constant infusion is running, the port may not need to be flushed.  How long will my port stay implanted? The port can stay in for as long as your health care provider thinks it is needed. When it is time for the port to come out, surgery will be   done to remove it. The procedure is similar to the one performed when the port was put in. When should I seek immediate medical care? When you have an implanted port, you should seek immediate medical care if:  You notice a bad smell coming from the incision site.  You have swelling, redness, or drainage at the incision site.  You have more swelling or pain at the port site or the surrounding area.  You have a fever that is not controlled with medicine.  This information is not intended to replace advice given to you by your health care provider. Make sure you discuss any questions you have with your health care provider. Document  Released: 06/26/2005 Document Revised: 12/02/2015 Document Reviewed: 03/03/2013 Elsevier Interactive Patient Education  2017 Elsevier Inc.  

## 2017-01-18 LAB — CA 125: Cancer Antigen (CA) 125: 340.8 U/mL — ABNORMAL HIGH (ref 0.0–38.1)

## 2017-01-19 ENCOUNTER — Other Ambulatory Visit (HOSPITAL_COMMUNITY): Payer: Medicare Other

## 2017-01-22 ENCOUNTER — Telehealth: Payer: Self-pay | Admitting: Cardiology

## 2017-01-22 DIAGNOSIS — M170 Bilateral primary osteoarthritis of knee: Secondary | ICD-10-CM | POA: Diagnosis not present

## 2017-01-22 DIAGNOSIS — Z7969 Long term (current) use of other immunomodulators and immunosuppressants: Secondary | ICD-10-CM

## 2017-01-22 DIAGNOSIS — Z79899 Other long term (current) drug therapy: Secondary | ICD-10-CM

## 2017-01-22 NOTE — Telephone Encounter (Signed)
Pt had an echo done on 01/17/17, and was ordered to have repeat echo's with STRAIN every 3 months for being on chemo.  This is indicated in Dr Francesca Oman last OV with the pt, as noted below.   Order for echo is placed.  ECHO with STRAIN written in comments, and message was sent to [facility] to arrange for 3 months out.  Pt is aware of this plan.     ASSESSMENT AND PLAN:  1.  CAD, Abnormal chest CT with coronary calcifications and DOE - normal Lexiscan nuclear stress test in 04/2015 (unable to walk on a treadmill) to assess for significant ischemia. We will continue atorvastatin and add carvedilol 3.125 mg by mouth twice a day.  2. Hypertension - worsening with Avastin, considering she is also on chemotherapy will start carvedilol 3.125 mg by mouth twice a day. Continue Maxide 25.  3. Chemotherapy with Avastin - we will obtain baseline echocardiogram with strain and repeat in 3 months.  4. Right calf edema and tenderness - we will check venous duplex to evaluate for potential DVT as Avastin is known for causing this in addition to increased risk with cancer itself.  5. HLP - check lipids prior to her next visit, for now continue atorvastatin 10 mg daily and fish oil.   Follow up in 1 year  Signed, Ena Dawley, MD  10/02/2016 1:24 PM    [facility] [place], [place], [place]  [address] Phone: [phone]; Fax: [phone]

## 2017-01-22 NOTE — Telephone Encounter (Signed)
-----   Message from Armando Gang sent at 01/22/2017  4:38 PM EDT ----- Regarding: RE: echo wtih STRAIN /04-19-17  patient is aware    ----- Message ----- From: Nuala Alpha, LPN Sent: 4/72/0721   2:59 PM To: [facility] Subject: echo wtih STRAIN to be done in 3 months per #  Pt had an echo done on 01/17/17, and was ordered to have repeat echo's with STRAIN every 3 months for being on chemo.  This is indicated in Dr Francesca Oman last OV with the pt, as noted below.   Order for echo is placed.  ECHO with STRAIN written in comments, and message was sent to [facility] to arrange for 3 months out.  Pt is aware of this plan.  Can you call and arrange?  Thanks so much,   [facility]

## 2017-01-22 NOTE — Telephone Encounter (Signed)
New Message     How often does she need to have the Echo done ?

## 2017-01-24 ENCOUNTER — Telehealth: Payer: Self-pay | Admitting: *Deleted

## 2017-01-24 NOTE — Telephone Encounter (Signed)
Patient returned call. Lab results discussed. Patient is disappointed in results. She is starting to feel much better, breathing has improved. Patient confirms follow up appointments.

## 2017-01-24 NOTE — Telephone Encounter (Signed)
Telephone call to patient to discuss lab results as directed below. Left message for return call.

## 2017-01-24 NOTE — Telephone Encounter (Signed)
-----   Message from [name], NP sent at 01/18/2017  8:50 AM EDT ----- Please let her know the CA-125 is higher. Follow-up as scheduled.

## 2017-01-31 ENCOUNTER — Encounter: Payer: Self-pay | Admitting: Family Medicine

## 2017-01-31 ENCOUNTER — Ambulatory Visit (INDEPENDENT_AMBULATORY_CARE_PROVIDER_SITE_OTHER): Payer: Medicare Other | Admitting: Family Medicine

## 2017-01-31 VITALS — BP 131/62 | HR 78 | Temp 98.1°F | Resp 17 | Ht 61.0 in | Wt 198.4 lb

## 2017-01-31 DIAGNOSIS — I10 Essential (primary) hypertension: Secondary | ICD-10-CM

## 2017-01-31 DIAGNOSIS — J9611 Chronic respiratory failure with hypoxia: Secondary | ICD-10-CM | POA: Diagnosis not present

## 2017-01-31 DIAGNOSIS — I2510 Atherosclerotic heart disease of native coronary artery without angina pectoris: Secondary | ICD-10-CM | POA: Diagnosis not present

## 2017-01-31 DIAGNOSIS — E785 Hyperlipidemia, unspecified: Secondary | ICD-10-CM | POA: Diagnosis not present

## 2017-01-31 DIAGNOSIS — N3946 Mixed incontinence: Secondary | ICD-10-CM | POA: Insufficient documentation

## 2017-01-31 DIAGNOSIS — T451X5A Adverse effect of antineoplastic and immunosuppressive drugs, initial encounter: Secondary | ICD-10-CM

## 2017-01-31 DIAGNOSIS — G620 Drug-induced polyneuropathy: Secondary | ICD-10-CM | POA: Diagnosis not present

## 2017-01-31 MED ORDER — MIRABEGRON ER 25 MG PO TB24: 25.0000 mg | ORAL_TABLET | Freq: Every day | ORAL | 3 refills | Status: DC

## 2017-01-31 NOTE — Assessment & Plan Note (Signed)
New.  Pt is not interested in seeing urology and is not able to tolerate the anticholinergic side effects of other bladder agents.  Will attempt Myrbetriq.  Pt expressed understanding and is in agreement w/ plan.

## 2017-01-31 NOTE — Patient Instructions (Addendum)
Schedule your Wellness Visit at your convenience and follow up w/ me in 6 months to recheck BP and cholesterol No need for labs at this time- they all look great! Start the Myrbetriq once daily for the incontinence Keep up the good work- you're doing great!!!  I'm amazed by your fighting spirit! Call with any questions or concerns Enjoy the rest of your summer!!!

## 2017-01-31 NOTE — Assessment & Plan Note (Signed)
Chronic problem.  On Lipitor daily.  Reviewed recent labs from cardiology.  No med changes needed.  Will follow.

## 2017-01-31 NOTE — Assessment & Plan Note (Signed)
Chronic problem.  Adequate control today.  Asymptomatic.  Reviewed recent labs.  No med changes at this time.  Will continue to follow. 

## 2017-01-31 NOTE — Assessment & Plan Note (Signed)
Chronic problem.  On Avastin w/ Dr Benay Spice.  Pt reports sxs are not terribly bothersome at this time.  Will follow.

## 2017-01-31 NOTE — Progress Notes (Signed)
Pre visit review using our clinic review tool, if applicable. No additional management support is needed unless otherwise documented below in the visit note. 

## 2017-01-31 NOTE — Assessment & Plan Note (Signed)
Chronic problem.  Following w/ Dr Lamonte Sakai.  On Stiolto daily w/ albuterol PRN.  Using O2 regularly now.  Will follow along and assist as able.

## 2017-01-31 NOTE — Progress Notes (Signed)
Subjective:   Lynn Ramirez is a 76 y.o. female who presents for Medicare Annual (Subsequent) preventive examination.  Review of Systems:  No ROS.  Medicare Wellness Visit. Additional risk factors are reflected in the social history.    Sleep patterns: Home Safety/Smoke Alarms: Feels safe in home. Smoke alarms in place.  Living environment; residence and Firearm Safety:  [place] Safety/Bike Helmet: Wears seat belt.   Counseling:   Eye Exam-  Dental-  Female:   [id number]       Mammo-04/05/2016, negative.       Dexa scan-04/05/2016, normal.  CCS-Colonoscopy 08/13/2014, polyps. Recall 10 years.       Objective:     Vitals: There were no vitals taken for this visit.  There is no height or weight on file to calculate BMI.   Tobacco History  Smoking Status  . Former Smoker  . Packs/day: 1.00  . Years: 40.00  . Types: Cigarettes  . Quit date: 07/10/2013  Smokeless Tobacco  . Never Used     Counseling given: Not Answered   Past Medical History:  Diagnosis Date  . CKD (chronic kidney disease), stage II   . COPD (chronic obstructive pulmonary disease) ([place])   . Coronary artery calcification seen on CT scan   . Difficulty sleeping   . Eczema    hands  . Emphysema   . GERD (gastroesophageal reflux disease)   . H/O hydronephrosis   . History of transfusion    age 54  . Hyperlipidemia   . Hypertension   . Ovarian cancer ([place])    metastatic - prior malignant R pleural effusion and peritoneal carcinomatosis  . S/p nephrectomy    Past Surgical History:  Procedure Laterality Date  . ABDOMINAL HYSTERECTOMY    . CHOLECYSTECTOMY N/A 08/09/2015   Procedure: Attempted LAPAROSCOPIC coverted  open CHOLECYSTECTOMY WITH INTRAOPERATIVE CHOLANGIOGRAM;  Surgeon: Autumn Messing III, MD;  Location: [facility];  Service: General;  Laterality: N/A;  . HAND SURGERY Right 1980's  . LAPAROTOMY N/A 12/29/2014   Procedure:  LAPAROTOMY;  Surgeon: Everitt Amber, MD;  Location: [facility];  Service:  Gynecology;  Laterality: N/A;  . NEPHRECTOMY    . ROBOTIC ASSISTED TOTAL HYSTERECTOMY WITH BILATERAL SALPINGO OOPHERECTOMY Bilateral 12/29/2014   Procedure: ROBOTIC ASSISTED TOTAL LAPAROSCOPIC HYSTERECTOMY WITH BILATERAL SALPINGO OOPHORECTOMY AND OOMENTECTOMY WITH RADICAL TUMOR [place] ;  Surgeon: Everitt Amber, MD;  Location: [facility];  Service: Gynecology;  Laterality: Bilateral;  . THORACENTESIS     several  . TONSILLECTOMY    . TUBAL LIGATION     Family History  Problem Relation Age of Onset  . Diabetes Father   . Lung cancer Father 57       metastasis to liver  . Cancer Father        liver  . Pancreatic cancer Mother 69  . Stroke Paternal Grandfather   . Heart disease Maternal Aunt   . Diabetes Paternal Uncle   . Heart Problems Maternal Grandmother   . Heart Problems Maternal Grandfather   . Diabetes Paternal Grandmother   . Stroke Paternal Grandmother   . Heart Problems Paternal Uncle   . Cancer Cousin        unknown type  . Breast cancer Cousin        dx. 17s  . Leukemia Cousin        dx. 16-17  . Colon cancer Neg Hx   . Stomach cancer Neg Hx    History  Sexual Activity  .  Sexual activity: Not on file    Outpatient Encounter Prescriptions as of 01/31/2017  Medication Sig  . Acetaminophen (TYLENOL) 325 MG CAPS Take 1 tablet by mouth 3 (three) times daily as needed (pain).   Marland Kitchen albuterol (PROVENTIL) (2.5 MG/3ML) 0.083% nebulizer solution Take 3 mLs (2.5 mg total) by nebulization every 6 (six) hours as needed for wheezing or shortness of breath.  Marland Kitchen antiseptic oral rinse (BIOTENE) LIQD 15 mLs by Mouth Rinse route 3 (three) times daily.  Marland Kitchen atorvastatin (LIPITOR) 10 MG tablet TAKE 1 TABLET(10 MG) BY MOUTH DAILY  . benzonatate (TESSALON) 100 MG capsule Take 1 capsule (100 mg total) by mouth every 6 (six) hours as needed for cough.  . Biotin 2500 MCG CAPS Take 1 tablet by mouth daily.  . carvedilol (COREG) 3.125 MG tablet Take 1 tablet (3.125 mg total) by mouth 2 (two) times  daily with a meal.  . cholecalciferol (VITAMIN D) 1000 units tablet Take 1,000 Units by mouth daily.  . diphenoxylate-atropine (LOMOTIL) 2.5-0.025 MG tablet TAKE 2 TABLETS BY MOUTH FOUR TIMES DAILY AS NEEDED FOR DIARRHEA OR LOOSE STOOLS  . docusate sodium (COLACE) 100 MG capsule Take 1 capsule (100 mg total) by mouth 2 (two) times daily.  . famotidine (PEPCID) 10 MG tablet Take 20 mg by mouth daily.   . fluocinonide cream (LIDEX) 1.75 % Apply 1 application topically 2 (two) times daily as needed (For ezcema).   . gabapentin (NEURONTIN) 300 MG capsule TAKE ONE CAPSULE BY MOUTH AT BEDTIME  . lidocaine-prilocaine (EMLA) cream Apply to port site one hour prior to use. Do not rub in. Cover with plastic.  Marland Kitchen loperamide (IMODIUM) 2 MG capsule Take by mouth as needed for diarrhea or loose stools.  Marland Kitchen loratadine (CLARITIN) 10 MG tablet Take 10 mg by mouth daily.  . Omega-3 Fatty Acids (OMEGA-3 PO) Take 1 capsule by mouth daily.  . ondansetron (ZOFRAN-ODT) 4 MG disintegrating tablet Take 1 tablet (4 mg total) by mouth every 8 (eight) hours as needed for nausea or vomiting.  Marland Kitchen oxyCODONE-acetaminophen (PERCOCET/ROXICET) 5-325 MG tablet Take 1 tablet by mouth every 6 (six) hours as needed for severe pain.  . polyethylene glycol (MIRALAX / GLYCOLAX) packet Take 17 g by mouth daily.  . predniSONE (DELTASONE) 10 MG tablet Take 4 tabs by mouth for 3 days, then 3 for 3 days, 2 for 3 days, 1 for 3 days and stop  . PROAIR HFA 108 (90 Base) MCG/ACT inhaler INHALE 2 PUFFS INTO THE LUNGS FOUR TIMES DAILY AS NEEDED FOR WHEEZING  . senna (SENOKOT) 8.6 MG TABS tablet Take 2 tablets (17.2 mg total) by mouth daily.  . Tiotropium Bromide-Olodaterol (STIOLTO RESPIMAT) 2.5-2.5 MCG/ACT AERS Inhale 2 puffs into the lungs daily.  . Tiotropium Bromide-Olodaterol (STIOLTO RESPIMAT) 2.5-2.5 MCG/ACT AERS Inhale 2 puffs into the lungs daily.  . traMADol (ULTRAM) 50 MG tablet TAKE 1 TABLET BY MOUTH EVERY 12 HOURS AS NEEDED FOR PAIN  .  triamterene-hydrochlorothiazide (MAXZIDE-25) 37.5-25 MG tablet Take 0.5 tablets by mouth daily.   . vitamin B-12 (CYANOCOBALAMIN) 100 MCG tablet Take 100 mcg by mouth daily. Reported on 01/19/2016  . zolpidem (AMBIEN) 5 MG tablet Take 1 tablet (5 mg total) by mouth at bedtime as needed for sleep.   No facility-administered encounter medications on file as of 01/31/2017.     Activities of Daily Living In your present state of health, do you have any difficulty performing the following activities: 08/07/2016  Hearing? N  Vision? Y  Difficulty  concentrating or making decisions? N  Walking or climbing stairs? N  Dressing or bathing? N  Doing errands, shopping? N  Preparing Food and eating ? N  Using the Toilet? N  In the past six months, have you accidently leaked urine? Y  Do you have problems with loss of bowel control? N  Managing your Medications? N  Managing your Finances? N  Housekeeping or managing your Housekeeping? N  Some recent data might be hidden    Patient Care Team: Midge Minium, MD as PCP - General (Family Medicine) Collene Gobble, MD as Consulting Physician (Pulmonary Disease) Ladell Pier, MD as Consulting Physician (Oncology) Dorothy Spark, MD as Consulting Physician (Cardiology)    Assessment:    Physical assessment deferred to PCP.  Exercise Activities and Dietary recommendations   Diet (meal preparation, eat out, water intake, caffeinated beverages, dairy products, fruits and vegetables):   Breakfast: Lunch:  Dinner:      Goals    None     Fall Risk Fall Risk  08/07/2016  Falls in the past year? No   Depression Screen PHQ 2/9 Scores 08/07/2016  PHQ - 2 Score 0  PHQ- 9 Score 0     Cognitive Function        Immunization History  Administered Date(s) Administered  . Influenza Split 03/26/2014  . Influenza Whole 04/09/2012  . Influenza, High Dose Seasonal PF 03/10/2013  . Influenza,inj,Quad PF,36+ Mos 05/06/2015, 04/12/2016    . Pneumococcal Conjugate-13 05/06/2015  . Pneumococcal Polysaccharide-23 05/17/2016   Screening Tests Health Maintenance  Topic Date Due  . TETANUS/TDAP  04/09/2017 (Originally 04/07/1960)  . INFLUENZA VACCINE  02/07/2017  . MAMMOGRAM  04/05/2017  . DEXA SCAN  04/12/2018  . PNA vac Low Risk Adult  Completed      Plan:     I have personally reviewed and noted the following in the patient's chart:   . Medical and social history . Use of alcohol, tobacco or illicit drugs  . Current medications and supplements . Functional ability and status . Nutritional status . Physical activity . Advanced directives . List of other physicians . Hospitalizations, surgeries, and ER visits in previous 12 months . Vitals . Screenings to include cognitive, depression, and falls . Referrals and appointments  In addition, I have reviewed and discussed with patient certain preventive protocols, quality metrics, and best practice recommendations. A written personalized care plan for preventive services as well as general preventive health recommendations were provided to patient.     Gerilyn Nestle, RN  01/31/2017

## 2017-01-31 NOTE — Progress Notes (Signed)
   Subjective:    Patient ID: Lynn Ramirez, female    DOB: [birth year], 76 y.o.   MRN: [id number]  HPI HTN- chronic problem, on Coreg, Triamterene HCTZ w/ adequate control.  Denies CP, SOB above baseline, HAs, visual changes, edema.  Hyperlipidemia- chronic problem, on Lipitor.  Denies abd pain, N/V.  Dr Meda Coffee did lipid panel 1 month ago and everything looked good.  COPD- chronic problem, following w/ Dr Lamonte Sakai.  On Stiolto daily w/ albuterol prn.  On O2 via [place].  Chemo neuropathy- chronic problem.  Following w/ Dr Benay Spice.  Currently on Avastin for metastatic adenocarcinoma.  Mixed incontinence- pt has urge and stress incontinence.  Not interested in seeing a new doctor at this point   Review of Systems For ROS see HPI     Objective:   Physical Exam  Constitutional: She is oriented to person, place, and time. She appears well-developed and well-nourished. No distress.  obese  HENT:  Head: Normocephalic and atraumatic.  O2 in place via [place]  Eyes: Pupils are equal, round, and reactive to light. Conjunctivae and EOM are normal.  Neck: Normal range of motion. Neck supple. No thyromegaly present.  Cardiovascular: Normal rate, regular rhythm, normal heart sounds and intact distal pulses.   No murmur heard. Pulmonary/Chest: Effort normal and breath sounds normal. No respiratory distress.  Abdominal: Soft. She exhibits no distension. There is no tenderness.  Musculoskeletal: She exhibits no edema.  Lymphadenopathy:    She has no cervical adenopathy.  Neurological: She is alert and oriented to person, place, and time.  Skin: Skin is warm and dry.  Psychiatric: She has a normal mood and affect. Her behavior is normal.  Vitals reviewed.         Assessment & Plan:

## 2017-02-01 ENCOUNTER — Ambulatory Visit: Payer: Medicare Other

## 2017-02-01 ENCOUNTER — Ambulatory Visit (HOSPITAL_BASED_OUTPATIENT_CLINIC_OR_DEPARTMENT_OTHER): Payer: Medicare Other | Admitting: Nurse Practitioner

## 2017-02-01 ENCOUNTER — Ambulatory Visit (HOSPITAL_BASED_OUTPATIENT_CLINIC_OR_DEPARTMENT_OTHER): Payer: Medicare Other

## 2017-02-01 ENCOUNTER — Other Ambulatory Visit (HOSPITAL_BASED_OUTPATIENT_CLINIC_OR_DEPARTMENT_OTHER): Payer: Medicare Other

## 2017-02-01 VITALS — BP 126/86 | HR 94 | Temp 97.9°F | Resp 18 | Ht 61.0 in | Wt 195.6 lb

## 2017-02-01 VITALS — BP 136/67 | HR 72

## 2017-02-01 DIAGNOSIS — C5701 Malignant neoplasm of right fallopian tube: Secondary | ICD-10-CM

## 2017-02-01 DIAGNOSIS — C799 Secondary malignant neoplasm of unspecified site: Secondary | ICD-10-CM

## 2017-02-01 DIAGNOSIS — C569 Malignant neoplasm of unspecified ovary: Secondary | ICD-10-CM

## 2017-02-01 DIAGNOSIS — Z5112 Encounter for antineoplastic immunotherapy: Secondary | ICD-10-CM | POA: Diagnosis not present

## 2017-02-01 DIAGNOSIS — C482 Malignant neoplasm of peritoneum, unspecified: Secondary | ICD-10-CM | POA: Diagnosis not present

## 2017-02-01 DIAGNOSIS — Z5111 Encounter for antineoplastic chemotherapy: Secondary | ICD-10-CM

## 2017-02-01 DIAGNOSIS — G620 Drug-induced polyneuropathy: Secondary | ICD-10-CM

## 2017-02-01 DIAGNOSIS — D6959 Other secondary thrombocytopenia: Secondary | ICD-10-CM | POA: Diagnosis not present

## 2017-02-01 DIAGNOSIS — Z95828 Presence of other vascular implants and grafts: Secondary | ICD-10-CM

## 2017-02-01 LAB — CBC WITH DIFFERENTIAL/PLATELET
BASO%: 1.2 % (ref 0.0–2.0)
Basophils Absolute: 0 10*3/uL (ref 0.0–0.1)
EOS%: 3.4 % (ref 0.0–7.0)
Eosinophils Absolute: 0.1 10*3/uL (ref 0.0–0.5)
HEMATOCRIT: 36.5 % (ref 34.8–46.6)
HGB: 11.8 g/dL (ref 11.6–15.9)
LYMPH%: 21.3 % (ref 14.0–49.7)
MCH: 30.5 pg (ref 25.1–34.0)
MCHC: 32.4 g/dL (ref 31.5–36.0)
MCV: 94.3 fL (ref 79.5–101.0)
MONO#: 0.4 10*3/uL (ref 0.1–0.9)
MONO%: 10.8 % (ref 0.0–14.0)
NEUT#: 2.5 10*3/uL (ref 1.5–6.5)
NEUT%: 63.3 % (ref 38.4–76.8)
Platelets: 168 10*3/uL (ref 145–400)
RBC: 3.87 10*6/uL (ref 3.70–5.45)
RDW: 18.3 % — AB (ref 11.2–14.5)
WBC: 4 10*3/uL (ref 3.9–10.3)
lymph#: 0.8 10*3/uL — ABNORMAL LOW (ref 0.9–3.3)

## 2017-02-01 LAB — COMPREHENSIVE METABOLIC PANEL
ALT: 18 U/L (ref 0–55)
AST: 14 U/L (ref 5–34)
Albumin: 3.2 g/dL — ABNORMAL LOW (ref 3.5–5.0)
Alkaline Phosphatase: 66 U/L (ref 40–150)
Anion Gap: 8 mEq/L (ref 3–11)
BUN: 15.6 mg/dL (ref 7.0–26.0)
CALCIUM: 9.6 mg/dL (ref 8.4–10.4)
CHLORIDE: 99 meq/L (ref 98–109)
CO2: 33 meq/L — AB (ref 22–29)
CREATININE: 0.9 mg/dL (ref 0.6–1.1)
EGFR: 66 mL/min/{1.73_m2} — ABNORMAL LOW (ref >90)
GLUCOSE: 113 mg/dL (ref 70–140)
Potassium: 4.1 mEq/L (ref 3.5–5.1)
SODIUM: 139 meq/L (ref 136–145)
Total Bilirubin: 0.63 mg/dL (ref 0.20–1.20)
Total Protein: 6.4 g/dL (ref 6.4–8.3)

## 2017-02-01 LAB — UA PROTEIN, DIPSTICK - CHCC: PROTEIN: NEGATIVE mg/dL

## 2017-02-01 MED ORDER — FAMOTIDINE IN NACL 20-0.9 MG/50ML-% IV SOLN
20.0000 mg | Freq: Once | INTRAVENOUS | Status: AC
  Administered 2017-02-01: 20 mg via INTRAVENOUS

## 2017-02-01 MED ORDER — FAMOTIDINE IN NACL 20-0.9 MG/50ML-% IV SOLN
INTRAVENOUS | Status: AC
  Filled 2017-02-01: qty 50

## 2017-02-01 MED ORDER — DEXAMETHASONE SODIUM PHOSPHATE 10 MG/ML IJ SOLN
5.0000 mg | Freq: Once | INTRAMUSCULAR | Status: AC
  Administered 2017-02-01: 5 mg via INTRAVENOUS

## 2017-02-01 MED ORDER — DIPHENHYDRAMINE HCL 50 MG/ML IJ SOLN
INTRAMUSCULAR | Status: AC
  Filled 2017-02-01: qty 1

## 2017-02-01 MED ORDER — SODIUM CHLORIDE 0.9 % IV SOLN
10.5000 mg/kg | Freq: Once | INTRAVENOUS | Status: AC
  Administered 2017-02-01: 900 mg via INTRAVENOUS
  Filled 2017-02-01: qty 32

## 2017-02-01 MED ORDER — PACLITAXEL CHEMO INJECTION 300 MG/50ML
80.0000 mg/m2 | Freq: Once | INTRAVENOUS | Status: AC
  Administered 2017-02-01: 150 mg via INTRAVENOUS
  Filled 2017-02-01: qty 25

## 2017-02-01 MED ORDER — SODIUM CHLORIDE 0.9% FLUSH
10.0000 mL | INTRAVENOUS | Status: DC | PRN
  Administered 2017-02-01: 10 mL
  Filled 2017-02-01: qty 10

## 2017-02-01 MED ORDER — HEPARIN SOD (PORK) LOCK FLUSH 100 UNIT/ML IV SOLN
500.0000 [IU] | Freq: Once | INTRAVENOUS | Status: AC | PRN
  Administered 2017-02-01: 500 [IU]
  Filled 2017-02-01: qty 5

## 2017-02-01 MED ORDER — DEXAMETHASONE SODIUM PHOSPHATE 10 MG/ML IJ SOLN
INTRAMUSCULAR | Status: AC
  Filled 2017-02-01: qty 1

## 2017-02-01 MED ORDER — DIPHENHYDRAMINE HCL 50 MG/ML IJ SOLN
25.0000 mg | Freq: Once | INTRAMUSCULAR | Status: AC
  Administered 2017-02-01: 25 mg via INTRAVENOUS

## 2017-02-01 MED ORDER — SODIUM CHLORIDE 0.9 % IV SOLN
Freq: Once | INTRAVENOUS | Status: AC
  Administered 2017-02-01: 11:00:00 via INTRAVENOUS

## 2017-02-01 MED ORDER — SODIUM CHLORIDE 0.9 % IJ SOLN
10.0000 mL | INTRAMUSCULAR | Status: DC | PRN
Start: 2017-02-01 — End: 2017-02-01
  Administered 2017-02-01: 10 mL via INTRAVENOUS
  Filled 2017-02-01: qty 10

## 2017-02-01 NOTE — Progress Notes (Signed)
[facility] OFFICE PROGRESS NOTE   Diagnosis:  Ovarian cancer  INTERVAL HISTORY:   Lynn Ramirez returns as scheduled. She completed another cycle of Taxol/Avastin 01/17/2017. She denies nausea. No mouth sores. No diarrhea or constipation. She has a good appetite. She denies any bleeding. Dyspnea is unchanged. She has stable neuropathy symptoms.  Objective:  Vital signs in last 24 hours:  Blood pressure 126/86, pulse 94, temperature 97.9 F (36.6 C), temperature source Oral, resp. rate 18, height 5\' 1"  (1.549 m), weight 195 lb 9.6 oz (88.7 kg), SpO2 93 %.    HEENT: No thrush or ulcers. Resp: Distant breath sounds. Cardio: Regular rate and rhythm. GI: Abdomen soft and nontender. No hepatomegaly. No apparent ascites. Vascular: Trace lower leg edema bilaterally. Neuro: Vibratory sense intact over the fingertips per tuning fork exam.  Skin: No rash. Port-A-Cath without erythema.    Lab Results:  Lab Results  Component Value Date   WBC 4.0 02/01/2017   HGB 11.8 02/01/2017   HCT 36.5 02/01/2017   MCV 94.3 02/01/2017   PLT 168 02/01/2017   NEUTROABS 2.5 02/01/2017   01/17/2017 CA-125 340 Imaging:  No results found.  Medications: I have reviewed the patient's current medications.  Assessment/Plan: 1. 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, immunohistochemical profile consistent with a GYN primary, elevated CA 125   Staging CTs of the chest, abdomen, and pelvis on 10/06/2014 revealed a loculated right pleural effusion, ascites, and omental/mesenteric thickening   Cytology from peritoneal fluid 10/13/2014 revealed malignant cells consistent with metastatic adenocarcinoma Biopsy of an omental mass on 11/02/2014 revealed invasive serous carcinoma with psammoma bodies Cycle 1  Taxol/carboplatin 10/28/2014 Cycle 2 Taxol/carboplatin 11/18/2014 Cycle 3 Taxol/carboplatin 12/09/2014  CT scan 12/23/2014 with interval improvement in peritoneal carcinomatosis with near-complete resolution of ascites and decreased omental nodularity. Significant improvement in malignant right pleural effusion.  Status post robotic-assisted laparoscopic hysterectomy with bilateral salpingoophorectomy, omentectomy, radical tumor debulking 12/29/2014. Per Dr. Serita Grit office note 01/14/2015 cytoreduction was optimal with residual disease remaining only in the 1 mm implants on the small intestine. 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 Cycle 1 adjuvant Taxol/carboplatin 01/20/2015 Cycle 2 adjuvant Taxol/carboplatin 02/10/2015 Cycle 3 adjuvant Taxol/carboplatin 03/10/2015 CA 125 on 1013 2016-42  CTs of the chest, abdomen, and pelvis 05/31/2015 with no evidence of progressive ovarian cancer  CTs of the chest, abdomen, and pelvis 08/07/2015 and 08/08/2015-no evidence of progressive ovarian cancer  Peritoneal studding noted at the time of the cholecystectomy procedure 08/09/2015 Elevated CA 125 10/07/2015  CT 10/07/2015 with stricturing at the sigmoid colon, constipation, and omental nodularity  Initiation of salvage weekly Taxol chemotherapy 10/13/2015  Taxol changed to every 2 weeks beginning 01/19/2016 due to painful neuropathy.  CT scans 07/19/2016 with no acute findings. No features in the abdomen or pelvis to suggest recurrent disease.Stable mild fullness right intrarenal collecting system.  Elevated CA 125 treatment resumed with Taxol/Avastin on a 2 week schedule 09/27/2016 2. COPD-followed by Dr.  Lamonte Sakai. 3. Dyspnea secondary to COPD and the large right pleural effusion, status post therapeutic thoracentesis procedures 09/30/2014,10/09/2014, and 10/21/2014. Left thoracentesis 10/30/2014 4. Left nephrectomy as a child 5. Delayed nausea following cycle 1 Taxol/carboplatin, Aloxi/Emend added with cycle 2 with improvement. 6. Right lower extremity edema, right calf pain 12/09/2014.  Negative venous Doppler 12/09/2014. 7. Diffuse pruritus following cycle 1 adjuvant Taxol/carboplatin 01/20/2015, no rash, resolved with a steroid dose pack 8. Thrombocytopenia second to chemotherapy, the carboplatin was dose reduced with cycle 2 adjuvant Taxol/carboplatin 02/10/2015 9. Chemotherapy-induced peripheral neuropathy-painful peripheral neuropathy involving the feet 01/19/2016.  10. Admission with acute cholecystitis 08/07/2015, status post a cholecystectomy 08/09/2015 11. Admission 10/08/2015 with abdominal pain/constipation, improved with bowel rest and laxatives 12. Mild right hydronephrosis noted on the CT 10/07/2015. Renal ultrasound 12/16/2015 with no hydronephrosis noted.No hydronephrosis on CT 07/19/2016.    Disposition: Lynn Ramirez appears stable. Plan to proceed with Taxol/Avastin today as scheduled. Restaging CTs scheduled 02/12/2017. She will return for a follow-up visit on 02/14/2017. She will contact the office in the interim with any problems.  Plan reviewed with Dr. Benay Spice.    Ned Card ANP/GNP-BC   02/01/2017  10:48 AM

## 2017-02-01 NOTE — Patient Instructions (Signed)

## 2017-02-01 NOTE — Patient Instructions (Signed)
[facility] Discharge Instructions for Patients Receiving Chemotherapy  Today you received the following chemotherapy agents Avastin, Taxol.   To help prevent nausea and vomiting after your treatment, we encourage you to take your nausea medication as prescribed.   If you develop nausea and vomiting that is not controlled by your nausea medication, call the clinic.   BELOW ARE SYMPTOMS THAT SHOULD BE REPORTED IMMEDIATELY:  *FEVER GREATER THAN 100.5 F  *CHILLS WITH OR WITHOUT FEVER  NAUSEA AND VOMITING THAT IS NOT CONTROLLED WITH YOUR NAUSEA MEDICATION  *UNUSUAL SHORTNESS OF BREATH  *UNUSUAL BRUISING OR BLEEDING  TENDERNESS IN MOUTH AND THROAT WITH OR WITHOUT PRESENCE OF ULCERS  *URINARY PROBLEMS  *BOWEL PROBLEMS  UNUSUAL RASH Items with * indicate a potential emergency and should be followed up as soon as possible.  Feel free to call the clinic you have any questions or concerns. The clinic phone number is [phone].  Please show the [facility] at check-in to the Emergency Department and triage nurse.

## 2017-02-02 ENCOUNTER — Telehealth: Payer: Self-pay | Admitting: Family Medicine

## 2017-02-02 ENCOUNTER — Telehealth: Payer: Self-pay | Admitting: *Deleted

## 2017-02-02 LAB — CA 125: Cancer Antigen (CA) 125: 360.4 U/mL — ABNORMAL HIGH (ref 0.0–38.1)

## 2017-02-02 NOTE — Telephone Encounter (Signed)
-----   Message from [name], NP sent at 02/02/2017  9:22 AM EDT ----- Please let her know the CA-125 was slightly higher. Follow-up as scheduled.

## 2017-02-02 NOTE — Telephone Encounter (Signed)
Telephone call to patient- lab results advised. Patient is very disappointed in this number. She was hoping this treatment would give her some additional time. Patient would like to discuss another option at her visit Aug 8th. Patient understands to call this office with any concerns or questions in the interm

## 2017-02-02 NOTE — Telephone Encounter (Signed)
Patient states she was recently prescribed mirabegron ER (MYRBETRIQ) 25 MG TB24 tablet.  After reading the  side effects of this medication, she noticed it may increase blood pressure and cause headaches.  She is concerned about taking this medication because she is also taking a statin and receiving chemotherapy.  She was informed side effects of the statin are also elevated bp and headaches.  She wants to be sure it is safe to take the Myrbetriq due to already having uncontrolled hypertension.

## 2017-02-02 NOTE — Telephone Encounter (Signed)
Patient notified of PCP recommendations and is agreement and expresses an understanding.  

## 2017-02-02 NOTE — Telephone Encounter (Signed)
Ok to start medications.  We can certainly monitor her BP.  If the medication improves her symptoms and improves her day to day quality of life, it's worth trying

## 2017-02-02 NOTE — Telephone Encounter (Signed)
Please advise 

## 2017-02-08 ENCOUNTER — Other Ambulatory Visit (INDEPENDENT_AMBULATORY_CARE_PROVIDER_SITE_OTHER): Payer: Medicare Other

## 2017-02-08 ENCOUNTER — Encounter: Payer: Self-pay | Admitting: Pulmonary Disease

## 2017-02-08 ENCOUNTER — Ambulatory Visit (INDEPENDENT_AMBULATORY_CARE_PROVIDER_SITE_OTHER): Payer: Medicare Other

## 2017-02-08 ENCOUNTER — Ambulatory Visit (INDEPENDENT_AMBULATORY_CARE_PROVIDER_SITE_OTHER)
Admission: RE | Admit: 2017-02-08 | Discharge: 2017-02-08 | Disposition: A | Discharge status: Home / Self Care | Payer: Medicare Other | Source: Ambulatory Visit | Attending: Pulmonary Disease | Admitting: Pulmonary Disease

## 2017-02-08 ENCOUNTER — Ambulatory Visit (INDEPENDENT_AMBULATORY_CARE_PROVIDER_SITE_OTHER): Payer: Medicare Other | Admitting: Pulmonary Disease

## 2017-02-08 VITALS — BP 136/68 | HR 93 | Temp 97.7°F | Ht 61.0 in | Wt 193.0 lb

## 2017-02-08 VITALS — BP 110/74 | HR 67 | Resp 20 | Ht 61.0 in | Wt 193.0 lb

## 2017-02-08 DIAGNOSIS — J449 Chronic obstructive pulmonary disease, unspecified: Secondary | ICD-10-CM | POA: Diagnosis not present

## 2017-02-08 DIAGNOSIS — J9611 Chronic respiratory failure with hypoxia: Secondary | ICD-10-CM

## 2017-02-08 DIAGNOSIS — I2510 Atherosclerotic heart disease of native coronary artery without angina pectoris: Secondary | ICD-10-CM | POA: Diagnosis not present

## 2017-02-08 DIAGNOSIS — J441 Chronic obstructive pulmonary disease with (acute) exacerbation: Secondary | ICD-10-CM | POA: Diagnosis not present

## 2017-02-08 DIAGNOSIS — R0602 Shortness of breath: Secondary | ICD-10-CM | POA: Diagnosis not present

## 2017-02-08 DIAGNOSIS — Z0000 Encounter for general adult medical examination without abnormal findings: Secondary | ICD-10-CM

## 2017-02-08 LAB — CBC WITH DIFFERENTIAL/PLATELET
BASOS ABS: 0 10*3/uL (ref 0.0–0.1)
BASOS PCT: 1.1 % (ref 0.0–3.0)
EOS ABS: 0.3 10*3/uL (ref 0.0–0.7)
Eosinophils Relative: 7.1 % — ABNORMAL HIGH (ref 0.0–5.0)
HEMATOCRIT: 35.6 % — AB (ref 36.0–46.0)
Hemoglobin: 11.5 g/dL — ABNORMAL LOW (ref 12.0–15.0)
LYMPHS ABS: 1.1 10*3/uL (ref 0.7–4.0)
LYMPHS PCT: 24.5 % (ref 12.0–46.0)
MCHC: 32.3 g/dL (ref 30.0–36.0)
MCV: 94.7 fl (ref 78.0–100.0)
Monocytes Absolute: 0.2 10*3/uL (ref 0.1–1.0)
Monocytes Relative: 3.8 % (ref 3.0–12.0)
NEUTROS ABS: 2.9 10*3/uL (ref 1.4–7.7)
NEUTROS PCT: 63.5 % (ref 43.0–77.0)
PLATELETS: 272 10*3/uL (ref 150.0–400.0)
RBC: 3.76 Mil/uL — ABNORMAL LOW (ref 3.87–5.11)
RDW: 17.7 % — AB (ref 11.5–15.5)
WBC: 4.6 10*3/uL (ref 4.0–10.5)

## 2017-02-08 MED ORDER — PREDNISONE 20 MG PO TABS: 20.0000 mg | ORAL_TABLET | Freq: Every day | ORAL | 0 refills | Status: DC

## 2017-02-08 MED ORDER — FLUTICASONE FUROATE 200 MCG/ACT IN AEPB: 1.0000 | INHALATION_SPRAY | Freq: Every day | RESPIRATORY_TRACT | 0 refills | Status: DC

## 2017-02-08 MED ORDER — TIOTROPIUM BROMIDE-OLODATEROL 2.5-2.5 MCG/ACT IN AERS: 2.0000 | INHALATION_SPRAY | Freq: Every day | RESPIRATORY_TRACT | 0 refills | Status: DC

## 2017-02-08 NOTE — Patient Instructions (Signed)
For your shortness of breath: We will check a chest x-ray and some blood work today to evaluate for an allergic reaction (CBC with differential and serum IgE) this is and whether or not you are holding onto fluid (pro BNP)  For your chronic respiratory failure with hypoxemia: Keep using oxygen as you are doing  For your COPD with an acute exacerbation: Take the prednisone as prescribed Continue taking Stiolto 2 puffs daily After the prednisone, start taking Arnuity one puff daily, samples given today, call us if this is helping  We will see you back in 3-4 weeks with an NP or Dr. Lamonte Sakai

## 2017-02-08 NOTE — Progress Notes (Signed)
Subjective:   Lynn Ramirez is a 76 y.o. female who presents for Medicare Annual (Subsequent) preventive examination.  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   Review of Systems:  No ROS.  Medicare Wellness Visit. Additional risk factors are reflected in the social history.  Cardiac Risk Factors include: advanced age (>77men, >89 women);hypertension;dyslipidemia;obesity (BMI >30kg/m2);family history of premature cardiovascular disease   Sleep patterns: Sleeps 3 to 8 hours, depending on dyspnea.  Home Safety/Smoke Alarms: Feels safe in home. Smoke alarms in place.  Living environment; residence and Firearm Safety: Lives with husband in 1 story home.  Seat Belt Safety/Bike Helmet: Wears seat belt.   Counseling:   Eye Exam-Last exam < 6 months. Yearly.  Dental-Full dentures. Gum care discussed.   Female:   Pap-2014      Mammo-04/05/2016, negative.       Dexa scan-04/12/2016, normal.        CCS-Colonoscopy 08/13/2014, polyps.      Objective:     Vitals: BP 110/74 (BP Location: Left Arm, Patient Position: Sitting, Cuff Size: Large)   Pulse 67   Resp 20   Ht 5\' 1"  (1.549 m)   Wt 193 lb (87.5 kg)   SpO2 96%   BMI 36.47 kg/m   Body mass index is 36.47 kg/m.   Tobacco History  Smoking Status  . Former Smoker  . Packs/day: 1.00  . Years: 40.00  . Types: Cigarettes  . Quit date: 07/10/2013  Smokeless Tobacco  . Never Used     Counseling given: Not Answered   Past Medical History:  Diagnosis Date  . CKD (chronic kidney disease), stage II   . COPD (chronic obstructive pulmonary disease) ([place])   . Coronary artery calcification seen on CT scan   . Difficulty sleeping   . Eczema    hands  . Emphysema   . GERD (gastroesophageal reflux disease)   . H/O hydronephrosis   . History of transfusion    age 65  . Hyperlipidemia   . Hypertension   . Ovarian cancer ([facility])    metastatic - prior malignant R pleural effusion and peritoneal carcinomatosis  . S/p nephrectomy    Past  Surgical History:  Procedure Laterality Date  . ABDOMINAL HYSTERECTOMY    . CHOLECYSTECTOMY N/A 08/09/2015   Procedure: Attempted LAPAROSCOPIC coverted  open CHOLECYSTECTOMY WITH INTRAOPERATIVE CHOLANGIOGRAM;  Surgeon: Autumn Messing III, MD;  Location: [facility];  Service: General;  Laterality: N/A;  . HAND SURGERY Right 1980's  . LAPAROTOMY N/A 12/29/2014   Procedure:  LAPAROTOMY;  Surgeon: Everitt Amber, MD;  Location: [facility];  Service: Gynecology;  Laterality: N/A;  . NEPHRECTOMY    . ROBOTIC ASSISTED TOTAL HYSTERECTOMY WITH BILATERAL SALPINGO OOPHERECTOMY Bilateral 12/29/2014   Procedure: ROBOTIC ASSISTED TOTAL LAPAROSCOPIC HYSTERECTOMY WITH BILATERAL SALPINGO OOPHORECTOMY AND OOMENTECTOMY WITH RADICAL TUMOR [place] ;  Surgeon: Everitt Amber, MD;  Location: [facility];  Service: Gynecology;  Laterality: Bilateral;  . THORACENTESIS     several  . TONSILLECTOMY    . TUBAL LIGATION     Family History  Problem Relation Age of Onset  . Diabetes Father   . Lung cancer Father 8       metastasis to liver  . Cancer Father        liver  . Pancreatic cancer Mother 58  . Stroke Paternal Grandfather   . Heart disease Maternal Aunt   . Diabetes Paternal Uncle   . Heart Problems Maternal Grandmother   . Heart Problems Maternal Grandfather   .  Diabetes Paternal Grandmother   . Stroke Paternal Grandmother   . Heart Problems Paternal Uncle   . Cancer Cousin        unknown type  . Breast cancer Cousin        dx. 33s  . Leukemia Cousin        dx. 16-17  . Colon cancer Neg Hx   . Stomach cancer Neg Hx    History  Sexual Activity  . Sexual activity: Not on file    Outpatient Encounter Prescriptions as of 02/08/2017  Medication Sig  . Acetaminophen (TYLENOL) 325 MG CAPS Take 1 tablet by mouth 3 (three) times daily as needed (pain).   Marland Kitchen albuterol (PROVENTIL) (2.5 MG/3ML) 0.083% nebulizer solution Take 3 mLs (2.5 mg total) by nebulization every 6 (six) hours as needed for wheezing or shortness of breath.    Marland Kitchen antiseptic oral rinse (BIOTENE) LIQD 15 mLs by Mouth Rinse route 3 (three) times daily.  Marland Kitchen atorvastatin (LIPITOR) 10 MG tablet TAKE 1 TABLET(10 MG) BY MOUTH DAILY  . benzonatate (TESSALON) 100 MG capsule Take 1 capsule (100 mg total) by mouth every 6 (six) hours as needed for cough.  . Biotin 2500 MCG CAPS Take 1 tablet by mouth daily.  . carvedilol (COREG) 3.125 MG tablet Take 1 tablet (3.125 mg total) by mouth 2 (two) times daily with a meal.  . cholecalciferol (VITAMIN D) 1000 units tablet Take 1,000 Units by mouth daily.  . diphenoxylate-atropine (LOMOTIL) 2.5-0.025 MG tablet TAKE 2 TABLETS BY MOUTH FOUR TIMES DAILY AS NEEDED FOR DIARRHEA OR LOOSE STOOLS  . docusate sodium (COLACE) 100 MG capsule Take 1 capsule (100 mg total) by mouth 2 (two) times daily.  . fluocinonide cream (LIDEX) 6.26 % Apply 1 application topically 2 (two) times daily as needed (For ezcema).   . gabapentin (NEURONTIN) 300 MG capsule TAKE ONE CAPSULE BY MOUTH AT BEDTIME  . lidocaine-prilocaine (EMLA) cream Apply to port site one hour prior to use. Do not rub in. Cover with plastic.  Marland Kitchen loperamide (IMODIUM) 2 MG capsule Take by mouth as needed for diarrhea or loose stools.  Marland Kitchen loratadine (CLARITIN) 10 MG tablet Take 10 mg by mouth daily.  . mirabegron ER (MYRBETRIQ) 25 MG TB24 tablet Take 1 tablet (25 mg total) by mouth daily.  . Omega-3 Fatty Acids (OMEGA-3 PO) Take 1 capsule by mouth daily.  . ondansetron (ZOFRAN-ODT) 4 MG disintegrating tablet Take 1 tablet (4 mg total) by mouth every 8 (eight) hours as needed for nausea or vomiting.  Marland Kitchen oxyCODONE-acetaminophen (PERCOCET/ROXICET) 5-325 MG tablet Take 1 tablet by mouth every 6 (six) hours as needed for severe pain.  . polyethylene glycol (MIRALAX / GLYCOLAX) packet Take 17 g by mouth daily.  Marland Kitchen PROAIR HFA 108 (90 Base) MCG/ACT inhaler INHALE 2 PUFFS INTO THE LUNGS FOUR TIMES DAILY AS NEEDED FOR WHEEZING  . senna (SENOKOT) 8.6 MG TABS tablet Take 2 tablets (17.2 mg total)  by mouth daily.  . Tiotropium Bromide-Olodaterol (STIOLTO RESPIMAT) 2.5-2.5 MCG/ACT AERS Inhale 2 puffs into the lungs daily.  . Tiotropium Bromide-Olodaterol (STIOLTO RESPIMAT) 2.5-2.5 MCG/ACT AERS Inhale 2 puffs into the lungs daily.  . traMADol (ULTRAM) 50 MG tablet TAKE 1 TABLET BY MOUTH EVERY 12 HOURS AS NEEDED FOR PAIN  . triamterene-hydrochlorothiazide (MAXZIDE-25) 37.5-25 MG tablet Take 0.5 tablets by mouth daily.   . vitamin B-12 (CYANOCOBALAMIN) 100 MCG tablet Take 100 mcg by mouth daily. Reported on 01/19/2016  . zolpidem (AMBIEN) 5 MG tablet Take 1 tablet (  5 mg total) by mouth at bedtime as needed for sleep.   No facility-administered encounter medications on file as of 02/08/2017.     Activities of Daily Living In your present state of health, do you have any difficulty performing the following activities: 02/08/2017 01/31/2017  Hearing? N N  Vision? N N  Comment - -  Difficulty concentrating or making decisions? N N  Comment problems with focusing/concentrating -  Walking or climbing stairs? Y N  Comment uses wheelchair and walker d/t leg weakness and SOB -  Dressing or bathing? N N  Doing errands, shopping? N N  Preparing Food and eating ? N N  Using the Toilet? N N  In the past six months, have you accidently leaked urine? N N  Do you have problems with loss of bowel control? N N  Managing your Medications? N N  Managing your Finances? N N  Housekeeping or managing your Housekeeping? N N  Some recent data might be hidden    Patient Care Team: Midge Minium, MD as PCP - General (Family Medicine) Collene Gobble, MD as Consulting Physician (Pulmonary Disease) Ladell Pier, MD as Consulting Physician (Oncology) Dorothy Spark, MD as Consulting Physician (Cardiology) Georgiann Hahn, MD as Referring Physician (Physical Medicine and Rehabilitation) Harriett Sine, MD as Consulting Physician (Dermatology) Everitt Amber, MD as Consulting Physician  (Obstetrics and Gynecology)    Assessment:    Physical assessment deferred to PCP.  Exercise Activities and Dietary recommendations Exercise limited by: orthopedic condition(s);respiratory conditions(s)   Diet (meal preparation, eat out, water intake, caffeinated beverages, dairy products, fruits and vegetables): Drinks water.   Breakfast: cereal; bagels, coffee Lunch: sandwich Dinner: protein and vegetables  Discussed heart healthy diet  Goals    . Weight (lb) < 200 lb (90.7 kg)          Lose weight by starting weight watchers.       Fall Risk Fall Risk  02/08/2017 01/31/2017 08/07/2016  Falls in the past year? No No No   Depression Screen PHQ 2/9 Scores 02/08/2017 01/31/2017 08/07/2016  PHQ - 2 Score 0 0 0  PHQ- 9 Score - 0 0     Cognitive Function       Ad8 score reviewed for issues:  Issues making decisions: no  Less interest in hobbies / activities: no  Repeats questions, stories (family complaining): no  Trouble using ordinary gadgets (microwave, computer, phone): no  Forgets the month or year: no  Mismanaging finances: no  Remembering appts: no  Daily problems with thinking and/or memory: no Ad8 score is=0     Immunization History  Administered Date(s) Administered  . Influenza Split 03/26/2014  . Influenza Whole 04/09/2012  . Influenza, High Dose Seasonal PF 03/10/2013  . Influenza,inj,Quad PF,36+ Mos 05/06/2015, 04/12/2016  . Pneumococcal Conjugate-13 05/06/2015  . Pneumococcal Polysaccharide-23 05/17/2016   Screening Tests Health Maintenance  Topic Date Due  . INFLUENZA VACCINE  02/07/2017  . TETANUS/TDAP  04/09/2017 (Originally 04/07/1960)  . MAMMOGRAM  04/05/2017  . DEXA SCAN  04/12/2018  . PNA vac Low Risk Adult  Completed      Plan:     Continue doing brain stimulating activities (puzzles, reading, adult coloring books, staying active) to keep memory sharp.   Ask about Shingrix vaccine.    I have personally reviewed and noted  the following in the patient's chart:   . Medical and social history . Use of alcohol, tobacco or illicit drugs  .  Current medications and supplements . Functional ability and status . Nutritional status . Physical activity . Advanced directives . List of other physicians . Hospitalizations, surgeries, and ER visits in previous 12 months . Vitals . Screenings to include cognitive, depression, and falls . Referrals and appointments  In addition, I have reviewed and discussed with patient certain preventive protocols, quality metrics, and best practice recommendations. A written personalized care plan for preventive services as well as general preventive health recommendations were provided to patient.     Gerilyn Nestle, RN  02/08/2017

## 2017-02-08 NOTE — Addendum Note (Signed)
Addended by: Len Blalock on: 02/08/2017 03:19 PM   Modules accepted: Orders

## 2017-02-08 NOTE — Progress Notes (Addendum)
Reviewed RN note. Patient has been seen by PCP earlier this week with all concerns addressed at that time.   Leeanne Rio, PA-C   Reviewed and agree.   Annye Asa, MD

## 2017-02-08 NOTE — Progress Notes (Signed)
Subjective:    Patient ID: Lynn Ramirez, female    DOB: November 02, 1940, 76 y.o.   MRN: [id number]  Synopsis: Patient of Dr. Lamonte Sakai who has COPD and ovarian cancer currently undergoing chemotherapy.  HPI Chief Complaint  Patient presents with  . Acute Visit    RB pt being treated for copd, chronic cough presents with increased sob, mostly nonprod cough with some clear mucus, wheezing X1 day.     Lynn Ramirez says that she has been having a lot of trouble breating.  It started last night.  She took her nebulizer twice last night and had a hard time sleeping.  She was waking up and using it in the middle of the night. She says that her sympotms started yesterday with a lot of cough.  She has been "coughing her head off".  She has taken the tesslon perles and she has been taking them but they haven't been helpful. She has been having this off and on since April.  She doesn't want to stay on the prednisone.  No recent sick contacts.  She coughs up a lot of clear mucus.  No fever or chills.    She feels run down from her chemotherapy.  She has been on treatment ever since March.  She feels very weak overall.  She says her legs are weak.    She has been compliant with her pro-Air and Stiolto.    She is still taking the nexium and claritin as prescribed by Dr. Lamonte Sakai.    No heart problems.  No recent leg swelling.   Past Medical History:  Diagnosis Date  . CKD (chronic kidney disease), stage II   . COPD (chronic obstructive pulmonary disease) ([place])   . Coronary artery calcification seen on CT scan   . Difficulty sleeping   . Eczema    hands  . Emphysema   . GERD (gastroesophageal reflux disease)   . H/O hydronephrosis   . History of transfusion    age 70  . Hyperlipidemia   . Hypertension   . Ovarian cancer ([place])    metastatic - prior malignant R pleural effusion and peritoneal carcinomatosis  . S/p nephrectomy       Review of Systems  Constitutional: Positive for fatigue. Negative for  chills and fever.  HENT: Positive for postnasal drip. Negative for nosebleeds and rhinorrhea.   Respiratory: Positive for choking, shortness of breath and wheezing.   Cardiovascular: Negative for chest pain, palpitations and leg swelling.       Objective:   Physical Exam  Vitals:   02/08/17 1443  BP: 136/68  Pulse: 93  Temp: 97.7 F (36.5 C)  TempSrc: Oral  SpO2: 96%  Weight: 193 lb (87.5 kg)  Height: 5\' 1"  (1.549 m)   2L [place]  Gen: chronically ill appearing HENT: OP clear, TM's clear, neck supple PULM: Upper airway wheeze predominantly B, normal percussion CV: RRR, no mgr, trace edema GI: BS+, soft, nontender Derm: no cyanosis or rash Psyche: anxious  CBC    Component Value Date/Time   WBC 4.0 02/01/2017 0918   WBC 6.2 10/18/2016 0336   RBC 3.87 02/01/2017 0918   RBC 4.26 10/18/2016 0336   HGB 11.8 02/01/2017 0918   HCT 36.5 02/01/2017 0918   PLT 168 02/01/2017 0918   MCV 94.3 02/01/2017 0918   MCH 30.5 02/01/2017 0918   MCH 30.0 10/18/2016 0336   MCHC 32.4 02/01/2017 0918   MCHC 31.8 10/18/2016 0336   RDW 18.3 (H) 02/01/2017  0321   LYMPHSABS 0.8 (L) 02/01/2017 0918   MONOABS 0.4 02/01/2017 0918   EOSABS 0.1 02/01/2017 0918   BASOSABS 0.0 02/01/2017 0918      Oncology records reviewed where she is undergoing chemotherapy for Ovarian cancer     Assessment & Plan:  COPD with acute exacerbation ([place]) - Plan: CBC w/Diff, IgE  Chronic obstructive pulmonary disease with acute exacerbation ([facility])  Chronic respiratory failure with hypoxia ([facility])  Shortness of breath  Discussion: She presents today with acute on chronic shortness of breath with wheezing which predominantly is heard best in the upper lung area. I believe that this is most likely a flareup of vocal cord dysfunction but given her history of COPD and her severe chronic respiratory failure with hypoxemia and the fact that she so frail with chemotherapy a think it's best to treat her with a  prednisone taper right now. Because of the recurrent exacerbations she has experienced lately I'm going to add an inhaled corticosteroid in sample form. Next line Differential diagnosis of which going on here includes a COPD exacerbation, vocal cord dysfunction, and less likely a congestive heart failure exacerbation. We will check a chest x-ray, proBNP as well as a CBC with differential to look for eosinophilia and elevated serum IgE to look for evidence of an ongoing allergy of some sort.  Plan: For your shortness of breath: We will check a chest x-ray and some blood work today to evaluate for an allergic reaction (CBC with differential and serum IgE) this is and whether or not you are holding onto fluid (pro BNP)  For your chronic respiratory failure with hypoxemia: Keep using oxygen as you are doing  For your COPD with an acute exacerbation: Take the prednisone as prescribed Continue taking Stiolto 2 puffs daily After the prednisone, start taking Arnuity one puff daily, samples given today, call us if this is helping  We will see you back in 3-4 weeks with an NP or Dr. Lamonte Sakai    Current Outpatient Prescriptions:  .  Acetaminophen (TYLENOL) 325 MG CAPS, Take 1 tablet by mouth 3 (three) times daily as needed (pain). , Disp: , Rfl:  .  albuterol (PROVENTIL) (2.5 MG/3ML) 0.083% nebulizer solution, Take 3 mLs (2.5 mg total) by nebulization every 6 (six) hours as needed for wheezing or shortness of breath., Disp: 75 mL, Rfl: 11 .  antiseptic oral rinse (BIOTENE) LIQD, 15 mLs by Mouth Rinse route 3 (three) times daily., Disp: , Rfl:  .  atorvastatin (LIPITOR) 10 MG tablet, TAKE 1 TABLET(10 MG) BY MOUTH DAILY, Disp: 90 tablet, Rfl: 2 .  Biotin 2500 MCG CAPS, Take 1 tablet by mouth daily., Disp: , Rfl:  .  carvedilol (COREG) 3.125 MG tablet, Take 1 tablet (3.125 mg total) by mouth 2 (two) times daily with a meal., Disp: 180 tablet, Rfl: 1 .  cholecalciferol (VITAMIN D) 1000 units tablet, Take  1,000 Units by mouth daily., Disp: , Rfl:  .  diphenoxylate-atropine (LOMOTIL) 2.5-0.025 MG tablet, TAKE 2 TABLETS BY MOUTH FOUR TIMES DAILY AS NEEDED FOR DIARRHEA OR LOOSE STOOLS, Disp: 20 tablet, Rfl: 0 .  docusate sodium (COLACE) 100 MG capsule, Take 1 capsule (100 mg total) by mouth 2 (two) times daily., Disp: 60 capsule, Rfl: 0 .  esomeprazole (NEXIUM) 20 MG capsule, Take 20 mg by mouth daily at 12 noon., Disp: , Rfl:  .  fluocinonide cream (LIDEX) 2.24 %, Apply 1 application topically 2 (two) times daily as needed (For ezcema). , Disp: ,  Rfl:  .  gabapentin (NEURONTIN) 300 MG capsule, TAKE ONE CAPSULE BY MOUTH AT BEDTIME, Disp: 90 capsule, Rfl: 0 .  lidocaine-prilocaine (EMLA) cream, Apply to port site one hour prior to use. Do not rub in. Cover with plastic., Disp: 30 g, Rfl: 0 .  loperamide (IMODIUM) 2 MG capsule, Take by mouth as needed for diarrhea or loose stools., Disp: , Rfl:  .  loratadine (CLARITIN) 10 MG tablet, Take 10 mg by mouth daily., Disp: , Rfl:  .  mirabegron ER (MYRBETRIQ) 25 MG TB24 tablet, Take 1 tablet (25 mg total) by mouth daily., Disp: 30 tablet, Rfl: 3 .  Omega-3 Fatty Acids (OMEGA-3 PO), Take 1 capsule by mouth daily., Disp: , Rfl:  .  ondansetron (ZOFRAN-ODT) 4 MG disintegrating tablet, Take 1 tablet (4 mg total) by mouth every 8 (eight) hours as needed for nausea or vomiting., Disp: 20 tablet, Rfl: 1 .  oxyCODONE-acetaminophen (PERCOCET/ROXICET) 5-325 MG tablet, Take 1 tablet by mouth every 6 (six) hours as needed for severe pain., Disp: 30 tablet, Rfl: 0 .  polyethylene glycol (MIRALAX / GLYCOLAX) packet, Take 17 g by mouth daily., Disp: 14 each, Rfl: 0 .  PROAIR HFA 108 (90 Base) MCG/ACT inhaler, INHALE 2 PUFFS INTO THE LUNGS FOUR TIMES DAILY AS NEEDED FOR WHEEZING, Disp: 8.5 g, Rfl: 5 .  senna (SENOKOT) 8.6 MG TABS tablet, Take 2 tablets (17.2 mg total) by mouth daily., Disp: 60 each, Rfl: 0 .  Tiotropium Bromide-Olodaterol (STIOLTO RESPIMAT) 2.5-2.5 MCG/ACT  AERS, Inhale 2 puffs into the lungs daily., Disp: 2 Inhaler, Rfl: 0 .  traMADol (ULTRAM) 50 MG tablet, TAKE 1 TABLET BY MOUTH EVERY 12 HOURS AS NEEDED FOR PAIN, Disp: 40 tablet, Rfl: 0 .  triamterene-hydrochlorothiazide (MAXZIDE-25) 37.5-25 MG tablet, Take 0.5 tablets by mouth daily. , Disp: , Rfl:  .  vitamin B-12 (CYANOCOBALAMIN) 100 MCG tablet, Take 100 mcg by mouth daily. Reported on 01/19/2016, Disp: , Rfl:  .  zolpidem (AMBIEN) 5 MG tablet, Take 1 tablet (5 mg total) by mouth at bedtime as needed for sleep., Disp: 30 tablet, Rfl: 0 .  benzonatate (TESSALON) 100 MG capsule, Take 1 capsule (100 mg total) by mouth every 6 (six) hours as needed for cough. (Patient not taking: Reported on 02/08/2017), Disp: 30 capsule, Rfl: 1 .  Fluticasone Furoate (ARNUITY ELLIPTA) 200 MCG/ACT AEPB, Inhale 1 puff into the lungs daily., Disp: 1 each, Rfl: 0 .  predniSONE (DELTASONE) 20 MG tablet, Take 1 tablet (20 mg total) by mouth daily with breakfast., Disp: 5 tablet, Rfl: 0

## 2017-02-08 NOTE — Patient Instructions (Addendum)
Continue doing brain stimulating activities (puzzles, reading, adult coloring books, staying active) to keep memory sharp.   Ask about Shingrix vaccine.     Fall Prevention in the Home Falls can cause injuries. They can happen to people of all ages. There are many things you can do to make your home safe and to help prevent falls. What can I do on the outside of my home?  Regularly fix the edges of walkways and driveways and fix any cracks.  Remove anything that might make you trip as you walk through a door, such as a raised step or threshold.  Trim any bushes or trees on the path to your home.  Use bright outdoor lighting.  Clear any walking paths of anything that might make someone trip, such as rocks or tools.  Regularly check to see if handrails are loose or broken. Make sure that both sides of any steps have handrails.  Any raised decks and porches should have guardrails on the edges.  Have any leaves, snow, or ice cleared regularly.  Use sand or salt on walking paths during winter.  Clean up any spills in your garage right away. This includes oil or grease spills. What can I do in the bathroom?  Use night lights.  Install grab bars by the toilet and in the tub and shower. Do not use towel bars as grab bars.  Use non-skid mats or decals in the tub or shower.  If you need to sit down in the shower, use a plastic, non-slip stool.  Keep the floor dry. Clean up any water that spills on the floor as soon as it happens.  Remove soap buildup in the tub or shower regularly.  Attach bath mats securely with double-sided non-slip rug tape.  Do not have throw rugs and other things on the floor that can make you trip. What can I do in the bedroom?  Use night lights.  Make sure that you have a light by your bed that is easy to reach.  Do not use any sheets or blankets that are too big for your bed. They should not hang down onto the floor.  Have a firm chair that has  side arms. You can use this for support while you get dressed.  Do not have throw rugs and other things on the floor that can make you trip. What can I do in the kitchen?  Clean up any spills right away.  Avoid walking on wet floors.  Keep items that you use a lot in easy-to-reach places.  If you need to reach something above you, use a strong step stool that has a grab bar.  Keep electrical cords out of the way.  Do not use floor polish or wax that makes floors slippery. If you must use wax, use non-skid floor wax.  Do not have throw rugs and other things on the floor that can make you trip. What can I do with my stairs?  Do not leave any items on the stairs.  Make sure that there are handrails on both sides of the stairs and use them. Fix handrails that are broken or loose. Make sure that handrails are as long as the stairways.  Check any carpeting to make sure that it is firmly attached to the stairs. Fix any carpet that is loose or worn.  Avoid having throw rugs at the top or bottom of the stairs. If you do have throw rugs, attach them to the floor with  carpet tape.  Make sure that you have a light switch at the top of the stairs and the bottom of the stairs. If you do not have them, ask someone to add them for you. What else can I do to help prevent falls?  Wear shoes that: ? Do not have high heels. ? Have rubber bottoms. ? Are comfortable and fit you well. ? Are closed at the toe. Do not wear sandals.  If you use a stepladder: ? Make sure that it is fully opened. Do not climb a closed stepladder. ? Make sure that both sides of the stepladder are locked into place. ? Ask someone to hold it for you, if possible.  Clearly mark and make sure that you can see: ? Any grab bars or handrails. ? First and last steps. ? Where the edge of each step is.  Use tools that help you move around (mobility aids) if they are needed. These  include: ? Canes. ? Walkers. ? Scooters. ? Crutches.  Turn on the lights when you go into a dark area. Replace any light bulbs as soon as they burn out.  Set up your furniture so you have a clear path. Avoid moving your furniture around.  If any of your floors are uneven, fix them.  If there are any pets around you, be aware of where they are.  Review your medicines with your doctor. Some medicines can make you feel dizzy. This can increase your chance of falling. Ask your doctor what other things that you can do to help prevent falls. This information is not intended to replace advice given to you by your health care provider. Make sure you discuss any questions you have with your health care provider. Document Released: 04/22/2009 Document Revised: 12/02/2015 Document Reviewed: 07/31/2014 Elsevier Interactive Patient Education  2018 [place] Maintenance, Female Adopting a healthy lifestyle and getting preventive care can go a long way to promote health and wellness. Talk with your health care provider about what schedule of regular examinations is right for you. This is a good chance for you to check in with your provider about disease prevention and staying healthy. In between checkups, there are plenty of things you can do on your own. Experts have done a lot of research about which lifestyle changes and preventive measures are most likely to keep you healthy. Ask your health care provider for more information. Weight and diet Eat a healthy diet  Be sure to include plenty of vegetables, fruits, low-fat dairy products, and lean protein.  Do not eat a lot of foods high in solid fats, added sugars, or salt.  Get regular exercise. This is one of the most important things you can do for your health. ? Most adults should exercise for at least 150 minutes each week. The exercise should increase your heart rate and make you sweat (moderate-intensity exercise). ? Most adults  should also do strengthening exercises at least twice a week. This is in addition to the moderate-intensity exercise.  Maintain a healthy weight  Body mass index (BMI) is a measurement that can be used to identify possible weight problems. It estimates body fat based on height and weight. Your health care provider can help determine your BMI and help you achieve or maintain a healthy weight.  For females 84 years of age and older: ? A BMI below 18.5 is considered underweight. ? A BMI of 18.5 to 24.9 is normal. ? A BMI of 25 to  29.9 is considered overweight. ? A BMI of 30 and above is considered obese.  Watch levels of cholesterol and blood lipids  You should start having your blood tested for lipids and cholesterol at 74 years of age, then have this test every 5 years.  You may need to have your cholesterol levels checked more often if: ? Your lipid or cholesterol levels are high. ? You are older than 74 years of age. ? You are at high risk for heart disease.  Cancer screening Lung Cancer  Lung cancer screening is recommended for adults 39-15 years old who are at high risk for lung cancer because of a history of smoking.  A yearly low-dose CT scan of the lungs is recommended for people who: ? Currently smoke. ? Have quit within the past 15 years. ? Have at least a 30-pack-year history of smoking. A pack year is smoking an average of one pack of cigarettes a day for 1 year.  Yearly screening should continue until it has been 15 years since you quit.  Yearly screening should stop if you develop a health problem that would prevent you from having lung cancer treatment.  Breast Cancer  Practice breast self-awareness. This means understanding how your breasts normally appear and feel.  It also means doing regular breast self-exams. Let your health care provider know about any changes, no matter how small.  If you are in your 20s or 30s, you should have a clinical breast exam (CBE)  by a health care provider every 1-3 years as part of a regular health exam.  If you are 66 or older, have a CBE every year. Also consider having a breast X-ray (mammogram) every year.  If you have a family history of breast cancer, talk to your health care provider about genetic screening.  If you are at high risk for breast cancer, talk to your health care provider about having an MRI and a mammogram every year.  Breast cancer gene (BRCA) assessment is recommended for women who have family members with BRCA-related cancers. BRCA-related cancers include: ? Breast. ? Ovarian. ? Tubal. ? Peritoneal cancers.  Results of the assessment will determine the need for genetic counseling and BRCA1 and BRCA2 testing.  Cervical Cancer Your health care provider may recommend that you be screened regularly for cancer of the pelvic organs (ovaries, uterus, and vagina). This screening involves a pelvic examination, including checking for microscopic changes to the surface of your cervix (Pap test). You may be encouraged to have this screening done every 3 years, beginning at age 54.  For women ages 76-65, health care providers may recommend pelvic exams and Pap testing every 3 years, or they may recommend the Pap and pelvic exam, combined with testing for human papilloma virus (HPV), every 5 years. Some types of HPV increase your risk of cervical cancer. Testing for HPV may also be done on women of any age with unclear Pap test results.  Other health care providers may not recommend any screening for nonpregnant women who are considered low risk for pelvic cancer and who do not have symptoms. Ask your health care provider if a screening pelvic exam is right for you.  If you have had past treatment for cervical cancer or a condition that could lead to cancer, you need Pap tests and screening for cancer for at least 20 years after your treatment. If Pap tests have been discontinued, your risk factors (such as  having a new sexual partner) need to  be reassessed to determine if screening should resume. Some women have medical problems that increase the chance of getting cervical cancer. In these cases, your health care provider may recommend more frequent screening and Pap tests.  Colorectal Cancer  This type of cancer can be detected and often prevented.  Routine colorectal cancer screening usually begins at 74 years of age and continues through 74 years of age.  Your health care provider may recommend screening at an earlier age if you have risk factors for colon cancer.  Your health care provider may also recommend using home test kits to check for hidden blood in the stool.  A small camera at the end of a tube can be used to examine your colon directly (sigmoidoscopy or colonoscopy). This is done to check for the earliest forms of colorectal cancer.  Routine screening usually begins at age 20.  Direct examination of the colon should be repeated every 5-10 years through 74 years of age. However, you may need to be screened more often if early forms of precancerous polyps or small growths are found.  Skin Cancer  Check your skin from head to toe regularly.  Tell your health care provider about any new moles or changes in moles, especially if there is a change in a mole's shape or color.  Also tell your health care provider if you have a mole that is larger than the size of a pencil eraser.  Always use sunscreen. Apply sunscreen liberally and repeatedly throughout the day.  Protect yourself by wearing long sleeves, pants, a wide-brimmed hat, and sunglasses whenever you are outside.  Heart disease, diabetes, and high blood pressure  High blood pressure causes heart disease and increases the risk of stroke. High blood pressure is more likely to develop in: ? People who have blood pressure in the high end of the normal range (130-139/85-89 mm Hg). ? People who are overweight or  obese. ? People who are African American.  If you are 38-60 years of age, have your blood pressure checked every 3-5 years. If you are 18 years of age or older, have your blood pressure checked every year. You should have your blood pressure measured twice-once when you are at a hospital or clinic, and once when you are not at a hospital or clinic. Record the average of the two measurements. To check your blood pressure when you are not at a hospital or clinic, you can use: ? An automated blood pressure machine at a pharmacy. ? A home blood pressure monitor.  If you are between 63 years and 3 years old, ask your health care provider if you should take aspirin to prevent strokes.  Have regular diabetes screenings. This involves taking a blood sample to check your fasting blood sugar level. ? If you are at a normal weight and have a low risk for diabetes, have this test once every three years after 74 years of age. ? If you are overweight and have a high risk for diabetes, consider being tested at a younger age or more often. Preventing infection Hepatitis B  If you have a higher risk for hepatitis B, you should be screened for this virus. You are considered at high risk for hepatitis B if: ? You were born in a country where hepatitis B is common. Ask your health care provider which countries are considered high risk. ? Your parents were born in a high-risk country, and you have not been immunized against hepatitis B (hepatitis B  vaccine). ? You have HIV or AIDS. ? You use needles to inject street drugs. ? You live with someone who has hepatitis B. ? You have had sex with someone who has hepatitis B. ? You get hemodialysis treatment. ? You take certain medicines for conditions, including cancer, organ transplantation, and autoimmune conditions.  Hepatitis C  Blood testing is recommended for: ? Everyone born from [birth year] through 1963. ? Anyone with known risk factors for hepatitis  C.  Sexually transmitted infections (STIs)  You should be screened for sexually transmitted infections (STIs) including gonorrhea and chlamydia if: ? You are sexually active and are younger than 74 years of age. ? You are older than 74 years of age and your health care provider tells you that you are at risk for this type of infection. ? Your sexual activity has changed since you were last screened and you are at an increased risk for chlamydia or gonorrhea. Ask your health care provider if you are at risk.  If you do not have HIV, but are at risk, it may be recommended that you take a prescription medicine daily to prevent HIV infection. This is called pre-exposure prophylaxis (PrEP). You are considered at risk if: ? You are sexually active and do not regularly use condoms or know the HIV status of your partner(s). ? You take drugs by injection. ? You are sexually active with a partner who has HIV.  Talk with your health care provider about whether you are at high risk of being infected with HIV. If you choose to begin PrEP, you should first be tested for HIV. You should then be tested every 3 months for as long as you are taking PrEP. Pregnancy  If you are premenopausal and you may become pregnant, ask your health care provider about preconception counseling.  If you may become pregnant, take 400 to 800 micrograms (mcg) of folic acid every day.  If you want to prevent pregnancy, talk to your health care provider about birth control (contraception). Osteoporosis and menopause  Osteoporosis is a disease in which the bones lose minerals and strength with aging. This can result in serious bone fractures. Your risk for osteoporosis can be identified using a bone density scan.  If you are 77 years of age or older, or if you are at risk for osteoporosis and fractures, ask your health care provider if you should be screened.  Ask your health care provider whether you should take a calcium or  vitamin D supplement to lower your risk for osteoporosis.  Menopause may have certain physical symptoms and risks.  Hormone replacement therapy may reduce some of these symptoms and risks. Talk to your health care provider about whether hormone replacement therapy is right for you. Follow these instructions at home:  Schedule regular health, dental, and eye exams.  Stay current with your immunizations.  Do not use any tobacco products including cigarettes, chewing tobacco, or electronic cigarettes.  If you are pregnant, do not drink alcohol.  If you are breastfeeding, limit how much and how often you drink alcohol.  Limit alcohol intake to no more than 1 drink per day for nonpregnant women. One drink equals 12 ounces of beer, 5 ounces of wine, or 1 ounces of hard liquor.  Do not use street drugs.  Do not share needles.  Ask your health care provider for help if you need support or information about quitting drugs.  Tell your health care provider if you often feel depressed.  Tell your health care provider if you have ever been abused or do not feel safe at home. This information is not intended to replace advice given to you by your health care provider. Make sure you discuss any questions you have with your health care provider. Document Released: 01/09/2011 Document Revised: 12/02/2015 Document Reviewed: 03/30/2015 Elsevier Interactive Patient Education  [employer].

## 2017-02-09 ENCOUNTER — Telehealth: Payer: Self-pay | Admitting: Internal Medicine

## 2017-02-09 LAB — IGE: IgE (Immunoglobulin E), Serum: 87 kU/L (ref <115)

## 2017-02-09 NOTE — Telephone Encounter (Signed)
Notes recorded by Juanito Doom, MD on 02/09/2017 at 1:33 PM EDT A, Please let the patient know this was OK, just showed COPD Thanks, B ----------------------------------- Notes recorded by Juanito Doom, MD on 02/09/2017 at 1:33 PM EDT A, Please let the patient know this was OK, mild anemia but nothing severe Thanks, B ---------------------------------------- Spoke with pt. She is aware of results. Nothing further was needed.

## 2017-02-11 ENCOUNTER — Other Ambulatory Visit: Payer: Self-pay | Admitting: Oncology

## 2017-02-12 ENCOUNTER — Encounter (HOSPITAL_COMMUNITY): Payer: Self-pay

## 2017-02-12 ENCOUNTER — Ambulatory Visit (HOSPITAL_COMMUNITY)
Admission: RE | Admit: 2017-02-12 | Discharge: 2017-02-12 | Disposition: A | Discharge status: Home / Self Care | Payer: Medicare Other | Source: Ambulatory Visit | Attending: Nurse Practitioner | Admitting: Nurse Practitioner

## 2017-02-12 DIAGNOSIS — R918 Other nonspecific abnormal finding of lung field: Secondary | ICD-10-CM | POA: Diagnosis not present

## 2017-02-12 DIAGNOSIS — C482 Malignant neoplasm of peritoneum, unspecified: Secondary | ICD-10-CM | POA: Diagnosis not present

## 2017-02-12 MED ORDER — IOPAMIDOL (ISOVUE-300) INJECTION 61%
30.0000 mL | Freq: Once | INTRAVENOUS | Status: AC | PRN
  Administered 2017-02-12: 30 mL via ORAL

## 2017-02-12 MED ORDER — IOPAMIDOL (ISOVUE-300) INJECTION 61%
100.0000 mL | Freq: Once | INTRAVENOUS | Status: AC | PRN
  Administered 2017-02-12: 100 mL via INTRAVENOUS

## 2017-02-12 MED ORDER — IOPAMIDOL (ISOVUE-300) INJECTION 61%
INTRAVENOUS | Status: AC
  Filled 2017-02-12: qty 100

## 2017-02-12 MED ORDER — IOPAMIDOL (ISOVUE-300) INJECTION 61%
INTRAVENOUS | Status: AC
  Filled 2017-02-12: qty 30

## 2017-02-14 ENCOUNTER — Other Ambulatory Visit (HOSPITAL_BASED_OUTPATIENT_CLINIC_OR_DEPARTMENT_OTHER): Payer: Medicare Other

## 2017-02-14 ENCOUNTER — Telehealth: Payer: Self-pay | Admitting: Oncology

## 2017-02-14 ENCOUNTER — Ambulatory Visit: Payer: Medicare Other

## 2017-02-14 ENCOUNTER — Ambulatory Visit (HOSPITAL_BASED_OUTPATIENT_CLINIC_OR_DEPARTMENT_OTHER): Payer: Medicare Other | Admitting: Oncology

## 2017-02-14 ENCOUNTER — Ambulatory Visit (HOSPITAL_BASED_OUTPATIENT_CLINIC_OR_DEPARTMENT_OTHER): Payer: Medicare Other

## 2017-02-14 VITALS — BP 127/52 | HR 91 | Temp 97.8°F | Resp 18 | Ht 61.0 in | Wt 197.4 lb

## 2017-02-14 DIAGNOSIS — C482 Malignant neoplasm of peritoneum, unspecified: Secondary | ICD-10-CM | POA: Diagnosis not present

## 2017-02-14 DIAGNOSIS — C569 Malignant neoplasm of unspecified ovary: Secondary | ICD-10-CM

## 2017-02-14 DIAGNOSIS — Z5111 Encounter for antineoplastic chemotherapy: Secondary | ICD-10-CM

## 2017-02-14 DIAGNOSIS — Z5112 Encounter for antineoplastic immunotherapy: Secondary | ICD-10-CM

## 2017-02-14 DIAGNOSIS — C7961 Secondary malignant neoplasm of right ovary: Secondary | ICD-10-CM

## 2017-02-14 DIAGNOSIS — Z95828 Presence of other vascular implants and grafts: Secondary | ICD-10-CM

## 2017-02-14 DIAGNOSIS — J910 Malignant pleural effusion: Secondary | ICD-10-CM | POA: Diagnosis not present

## 2017-02-14 DIAGNOSIS — C5701 Malignant neoplasm of right fallopian tube: Secondary | ICD-10-CM

## 2017-02-14 DIAGNOSIS — C7962 Secondary malignant neoplasm of left ovary: Secondary | ICD-10-CM

## 2017-02-14 LAB — CBC WITH DIFFERENTIAL/PLATELET
BASO%: 1 % (ref 0.0–2.0)
Basophils Absolute: 0 10*3/uL (ref 0.0–0.1)
EOS%: 1.6 % (ref 0.0–7.0)
Eosinophils Absolute: 0.1 10*3/uL (ref 0.0–0.5)
HCT: 35.4 % (ref 34.8–46.6)
HEMOGLOBIN: 11.6 g/dL (ref 11.6–15.9)
LYMPH%: 21.7 % (ref 14.0–49.7)
MCH: 30.8 pg (ref 25.1–34.0)
MCHC: 32.8 g/dL (ref 31.5–36.0)
MCV: 94 fL (ref 79.5–101.0)
MONO#: 0.5 10*3/uL (ref 0.1–0.9)
MONO%: 11 % (ref 0.0–14.0)
NEUT%: 64.7 % (ref 38.4–76.8)
NEUTROS ABS: 2.7 10*3/uL (ref 1.5–6.5)
Platelets: 260 10*3/uL (ref 145–400)
RBC: 3.77 10*6/uL (ref 3.70–5.45)
RDW: 18.7 % — AB (ref 11.2–14.5)
WBC: 4.2 10*3/uL (ref 3.9–10.3)
lymph#: 0.9 10*3/uL (ref 0.9–3.3)

## 2017-02-14 LAB — COMPREHENSIVE METABOLIC PANEL
ALBUMIN: 3.1 g/dL — AB (ref 3.5–5.0)
ALK PHOS: 77 U/L (ref 40–150)
ALT: 16 U/L (ref 0–55)
AST: 12 U/L (ref 5–34)
Anion Gap: 8 mEq/L (ref 3–11)
BILIRUBIN TOTAL: 0.36 mg/dL (ref 0.20–1.20)
BUN: 25.4 mg/dL (ref 7.0–26.0)
CO2: 32 meq/L — AB (ref 22–29)
Calcium: 9.8 mg/dL (ref 8.4–10.4)
Chloride: 100 mEq/L (ref 98–109)
Creatinine: 0.9 mg/dL (ref 0.6–1.1)
EGFR: 61 mL/min/{1.73_m2} — AB (ref >90)
GLUCOSE: 105 mg/dL (ref 70–140)
POTASSIUM: 4.2 meq/L (ref 3.5–5.1)
SODIUM: 140 meq/L (ref 136–145)
TOTAL PROTEIN: 6.1 g/dL — AB (ref 6.4–8.3)

## 2017-02-14 MED ORDER — SODIUM CHLORIDE 0.9 % IJ SOLN
10.0000 mL | INTRAMUSCULAR | Status: DC | PRN
  Administered 2017-02-14: 10 mL via INTRAVENOUS
  Filled 2017-02-14: qty 10

## 2017-02-14 MED ORDER — DIPHENHYDRAMINE HCL 50 MG/ML IJ SOLN
INTRAMUSCULAR | Status: AC
  Filled 2017-02-14: qty 1

## 2017-02-14 MED ORDER — SODIUM CHLORIDE 0.9% FLUSH
10.0000 mL | INTRAVENOUS | Status: DC | PRN
  Administered 2017-02-14: 10 mL
  Filled 2017-02-14: qty 10

## 2017-02-14 MED ORDER — HEPARIN SOD (PORK) LOCK FLUSH 100 UNIT/ML IV SOLN
500.0000 [IU] | Freq: Once | INTRAVENOUS | Status: AC | PRN
  Administered 2017-02-14: 500 [IU]
  Filled 2017-02-14: qty 5

## 2017-02-14 MED ORDER — DEXAMETHASONE SODIUM PHOSPHATE 10 MG/ML IJ SOLN
INTRAMUSCULAR | Status: AC
  Filled 2017-02-14: qty 1

## 2017-02-14 MED ORDER — SODIUM CHLORIDE 0.9 % IV SOLN
Freq: Once | INTRAVENOUS | Status: AC
  Administered 2017-02-14: 12:00:00 via INTRAVENOUS

## 2017-02-14 MED ORDER — DIPHENHYDRAMINE HCL 50 MG/ML IJ SOLN
25.0000 mg | Freq: Once | INTRAMUSCULAR | Status: AC
  Administered 2017-02-14: 25 mg via INTRAVENOUS

## 2017-02-14 MED ORDER — SODIUM CHLORIDE 0.9 % IV SOLN
10.4000 mg/kg | Freq: Once | INTRAVENOUS | Status: AC
  Administered 2017-02-14: 900 mg via INTRAVENOUS
  Filled 2017-02-14: qty 32

## 2017-02-14 MED ORDER — PACLITAXEL CHEMO INJECTION 300 MG/50ML
80.0000 mg/m2 | Freq: Once | INTRAVENOUS | Status: AC
  Administered 2017-02-14: 150 mg via INTRAVENOUS
  Filled 2017-02-14: qty 25

## 2017-02-14 MED ORDER — DEXAMETHASONE SODIUM PHOSPHATE 10 MG/ML IJ SOLN
5.0000 mg | Freq: Once | INTRAMUSCULAR | Status: AC
  Administered 2017-02-14: 5 mg via INTRAVENOUS

## 2017-02-14 MED ORDER — FAMOTIDINE IN NACL 20-0.9 MG/50ML-% IV SOLN
20.0000 mg | Freq: Once | INTRAVENOUS | Status: AC
  Administered 2017-02-14: 20 mg via INTRAVENOUS

## 2017-02-14 MED ORDER — FAMOTIDINE IN NACL 20-0.9 MG/50ML-% IV SOLN
INTRAVENOUS | Status: AC
  Filled 2017-02-14: qty 50

## 2017-02-14 NOTE — Progress Notes (Signed)
Lynn Ramirez OFFICE PROGRESS NOTE   Diagnosis: Ovarian cancer  INTERVAL HISTORY:   Lynn Ramirez returns as scheduled. She completed another treatment with Taxol/Avastin 02/01/2017. No nausea or bleeding. Stable neuropathy symptoms. The leg pain has improved. Her dyspnea is improved at present. No difficulty with bowel function.  Objective:  Vital signs in last 24 hours:  Blood pressure (!) 127/52, pulse 91, temperature 97.8 F (36.6 C), temperature source Oral, resp. rate 18, height 5\' 1"  (1.549 m), weight 197 lb 6.4 oz (89.5 kg), SpO2 94 %.    HEENT: No thrush or ulcers Resp: Lungs clear bilaterally Cardio: Regular rate and rhythm GI: No hepatomegaly, no apparent ascites, no mass Vascular: No leg edema   Portacath/PICC-without erythema  Lab Results:  Lab Results  Component Value Date   WBC 4.2 02/14/2017   HGB 11.6 02/14/2017   HCT 35.4 02/14/2017   MCV 94.0 02/14/2017   PLT 260 02/14/2017   NEUTROABS 2.7 02/14/2017    CMP     Component Value Date/Time   NA 140 02/14/2017 0934   K 4.2 02/14/2017 0934   CL 105 10/18/2016 0336   CO2 32 (H) 02/14/2017 0934   GLUCOSE 105 02/14/2017 0934   BUN 25.4 02/14/2017 0934   CREATININE 0.9 02/14/2017 0934   CALCIUM 9.8 02/14/2017 0934   PROT 6.1 (L) 02/14/2017 0934   ALBUMIN 3.1 (L) 02/14/2017 0934   AST 12 02/14/2017 0934   ALT 16 02/14/2017 0934   ALKPHOS 77 02/14/2017 0934   BILITOT 0.36 02/14/2017 0934   GFRNONAA 52 (L) 10/18/2016 0336   GFRAA >60 10/18/2016 0336     Imaging:  Ct Abdomen Pelvis W Contrast  Result Date: 02/12/2017 CLINICAL DATA:  Peritoneal carcinomatosis. Restaging ovarian carcinoma diagnosis March 2016. LEFT nephrectomy EXAM: CT ABDOMEN AND PELVIS WITH CONTRAST TECHNIQUE: Multidetector CT imaging of the abdomen and pelvis was performed using the standard protocol following bolus administration of intravenous contrast. CONTRAST:  129mL ISOVUE-300 IOPAMIDOL (ISOVUE-300) INJECTION 61%  5 mL infiltration.  Second IV started COMPARISON:  CT 07/19/2016 FINDINGS: Lower chest: Mild interlobular septal thickening in the RIGHT lower lobe Hepatobiliary: Trace amount of fluid over the RIGHT hepatic lobe (image 16, series 2). No discrete hepatic nodularity. Postcholecystectomy. Pancreas: Pancreas is normal. No ductal dilatation. No pancreatic inflammation. Spleen: Normal spleen Adrenals/urinary tract: Adrenal glands normal. RIGHT kidney normal. Ureter and bladder normal. LEFT nephrectomy Stomach/Bowel: Stomach, small-bowel cecum normal. Appendix not identified. Moderate volume stool throughout the colon. No obstructing lesion. Diverticular the descending colon Vascular/Lymphatic: Abdominal aorta is normal caliber with atherosclerotic calcification. There is no retroperitoneal or periportal lymphadenopathy. No pelvic lymphadenopathy. Reproductive: Post hysterectomy and oophorectomy. No nodularity in the pelvis. No pelvic lymphadenopathy Other: No free fluid in the abdomen pelvis. No peritoneal nodularity or omental studding identified. Musculoskeletal: No aggressive osseous lesion. IMPRESSION: 1. No evidence of carcinomatosis recurrence in the peritoneal space. 2. Trace fluid along the RIGHT hepatic lobe is noted. Recommend attention on routine surveillance. 3. Mild interlobular septal thickening at the RIGHT lung base is nonspecific. Potential interstitial edema. Electronically Signed   By: Suzy Bouchard M.D.   On: 02/12/2017 14:39    Medications: I have reviewed the patient's current medications.  Assessment/plan:  1. 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, immunohistochemical profile consistent with a GYN primary, elevated CA 125   Staging CTs of the chest, abdomen, and pelvis on 10/06/2014 revealed a loculated  right pleural effusion, ascites, and omental/mesenteric thickening  Cytology from peritoneal fluid 10/13/2014 revealed malignant cells consistent with metastatic adenocarcinoma Biopsy of an omental mass on 11/02/2014 revealed invasive serous carcinoma with psammoma bodies Cycle 1 Taxol/carboplatin 10/28/2014 Cycle 2 Taxol/carboplatin 11/18/2014 Cycle 3 Taxol/carboplatin 12/09/2014  CT scan 12/23/2014 with interval improvement in peritoneal carcinomatosis with near-complete resolution of ascites and decreased omental nodularity. Significant improvement in malignant right pleural effusion.  Status post robotic-assisted laparoscopic hysterectomy with bilateral salpingoophorectomy, omentectomy, radical tumor debulking 12/29/2014. Per Dr. Serita Grit office note 01/14/2015 cytoreduction was optimal with residual disease remaining only in the 1 mm implants on the small intestine. 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 Cycle 1 adjuvant Taxol/carboplatin 01/20/2015 Cycle 2 adjuvant Taxol/carboplatin 02/10/2015 Cycle 3 adjuvant Taxol/carboplatin 03/10/2015 CA 125 on 1013 2016-42  CTs of the chest, abdomen, and pelvis 05/31/2015 with no evidence of progressive ovarian cancer  CTs of the chest, abdomen, and pelvis 08/07/2015 and 08/08/2015-no evidence of progressive ovarian cancer  Peritoneal studding noted at the time of the cholecystectomy procedure 08/09/2015 Elevated CA 125 10/07/2015  CT 10/07/2015 with stricturing at the sigmoid colon, constipation, and omental nodularity  Initiation of salvage weekly Taxol chemotherapy 10/13/2015  Taxol changed to every 2 weeks beginning 01/19/2016 due to painful  neuropathy.  CT scans 07/19/2016 with no acute findings. No features in the abdomen or pelvis to suggest recurrent disease.Stable mild fullness right intrarenal collecting system.  Elevated CA 125 treatment resumed with Taxol/Avastin on a 2 week schedule 09/27/2016  CT 02/12/2017-no evidence of carcinomatosis, no evidence of progressive metastatic disease  Taxol/Avastin continued every 2 weeks 2. COPD-followed by Dr. Lamonte Sakai. 3. Dyspnea secondary to COPD and the large right pleural effusion, status post therapeutic thoracentesis procedures 09/30/2014,10/09/2014, and 10/21/2014. Left thoracentesis 10/30/2014 4. Left nephrectomy as a child 5. Delayed nausea following cycle 1 Taxol/carboplatin, Aloxi/Emend added with cycle 2 with improvement. 6. Right lower extremity edema, right calf pain 12/09/2014. Negative venous Doppler 12/09/2014. 7. Diffuse pruritus following cycle 1 adjuvant Taxol/carboplatin 01/20/2015, no rash, resolved with a steroid dose pack 8. Thrombocytopenia second to chemotherapy, the carboplatin was dose reduced with cycle 2 adjuvant Taxol/carboplatin 02/10/2015 9. Chemotherapy-induced peripheral neuropathy-painful peripheral neuropathy involving the feet 01/19/2016.  10. Admission with acute cholecystitis 08/07/2015, status post a cholecystectomy 08/09/2015 11. Admission 10/08/2015 with abdominal pain/constipation, improved with bowel rest and laxatives 12. Mild right hydronephrosis noted on the CT 10/07/2015. Renal ultrasound 12/16/2015 with no hydronephrosis noted.No hydronephrosis on CT 07/19/2016.  Disposition:  Lynn Ramirez appears unchanged. The restaging CT shows no evidence of disease progression. She is tolerating the Taxol/Avastin well. The plan is to continue every 2 week Taxol/Avastin.  She will return for an office visit and chemotherapy in 2 weeks.  15 minutes were spent with the patient today. The majority of the time was used for counseling and coordination  of care.  Donneta Romberg, MD  02/14/2017  11:02 AM

## 2017-02-14 NOTE — Patient Instructions (Signed)
[facility] Discharge Instructions for Patients Receiving Chemotherapy  Today you received the following chemotherapy agents:  Avastin and Taxol.  To help prevent nausea and vomiting after your treatment, we encourage you to take your nausea medication as directed.   If you develop nausea and vomiting that is not controlled by your nausea medication, call the clinic.   BELOW ARE SYMPTOMS THAT SHOULD BE REPORTED IMMEDIATELY:  *FEVER GREATER THAN 100.5 F  *CHILLS WITH OR WITHOUT FEVER  NAUSEA AND VOMITING THAT IS NOT CONTROLLED WITH YOUR NAUSEA MEDICATION  *UNUSUAL SHORTNESS OF BREATH  *UNUSUAL BRUISING OR BLEEDING  TENDERNESS IN MOUTH AND THROAT WITH OR WITHOUT PRESENCE OF ULCERS  *URINARY PROBLEMS  *BOWEL PROBLEMS  UNUSUAL RASH Items with * indicate a potential emergency and should be followed up as soon as possible.  Feel free to call the clinic you have any questions or concerns. The clinic phone number is [phone].  Please show the [place] at check-in to the Emergency Department and triage nurse.

## 2017-02-14 NOTE — Patient Instructions (Signed)

## 2017-02-14 NOTE — Telephone Encounter (Signed)
Gave patient avs and updated calendar for Aug and Sept.

## 2017-02-15 LAB — CA 125: Cancer Antigen (CA) 125: 318.6 U/mL — ABNORMAL HIGH (ref 0.0–38.1)

## 2017-02-25 ENCOUNTER — Other Ambulatory Visit: Payer: Self-pay | Admitting: Oncology

## 2017-02-26 ENCOUNTER — Telehealth: Payer: Self-pay | Admitting: Emergency Medicine

## 2017-02-26 NOTE — Telephone Encounter (Signed)
lmtcb x1 for pt. 

## 2017-02-27 MED ORDER — FLUTICASONE FUROATE 200 MCG/ACT IN AEPB: 1.0000 | INHALATION_SPRAY | Freq: Every day | RESPIRATORY_TRACT | 6 refills | Status: DC

## 2017-02-27 NOTE — Telephone Encounter (Signed)
Called and spoke with pt and she stated that the arnuity helped her and she would like to stay on this .  rx has been sent to her pharmacy.

## 2017-02-28 ENCOUNTER — Ambulatory Visit (HOSPITAL_BASED_OUTPATIENT_CLINIC_OR_DEPARTMENT_OTHER): Payer: Medicare Other | Admitting: Oncology

## 2017-02-28 ENCOUNTER — Telehealth: Payer: Self-pay | Admitting: Oncology

## 2017-02-28 ENCOUNTER — Other Ambulatory Visit (HOSPITAL_BASED_OUTPATIENT_CLINIC_OR_DEPARTMENT_OTHER): Payer: Medicare Other

## 2017-02-28 ENCOUNTER — Ambulatory Visit: Payer: Medicare Other

## 2017-02-28 ENCOUNTER — Ambulatory Visit (HOSPITAL_BASED_OUTPATIENT_CLINIC_OR_DEPARTMENT_OTHER): Payer: Medicare Other

## 2017-02-28 VITALS — BP 137/68 | HR 86 | Resp 18

## 2017-02-28 VITALS — BP 143/62 | HR 103 | Temp 98.2°F | Resp 22 | Ht 61.0 in | Wt 197.4 lb

## 2017-02-28 DIAGNOSIS — Z5112 Encounter for antineoplastic immunotherapy: Secondary | ICD-10-CM | POA: Diagnosis not present

## 2017-02-28 DIAGNOSIS — C482 Malignant neoplasm of peritoneum, unspecified: Secondary | ICD-10-CM

## 2017-02-28 DIAGNOSIS — C799 Secondary malignant neoplasm of unspecified site: Secondary | ICD-10-CM | POA: Diagnosis not present

## 2017-02-28 DIAGNOSIS — Z5111 Encounter for antineoplastic chemotherapy: Secondary | ICD-10-CM | POA: Diagnosis not present

## 2017-02-28 DIAGNOSIS — J910 Malignant pleural effusion: Secondary | ICD-10-CM

## 2017-02-28 DIAGNOSIS — Z452 Encounter for adjustment and management of vascular access device: Secondary | ICD-10-CM | POA: Diagnosis not present

## 2017-02-28 DIAGNOSIS — C5701 Malignant neoplasm of right fallopian tube: Secondary | ICD-10-CM

## 2017-02-28 DIAGNOSIS — Z95828 Presence of other vascular implants and grafts: Secondary | ICD-10-CM

## 2017-02-28 DIAGNOSIS — C7961 Secondary malignant neoplasm of right ovary: Secondary | ICD-10-CM

## 2017-02-28 DIAGNOSIS — D6959 Other secondary thrombocytopenia: Secondary | ICD-10-CM | POA: Diagnosis not present

## 2017-02-28 DIAGNOSIS — C569 Malignant neoplasm of unspecified ovary: Secondary | ICD-10-CM | POA: Diagnosis not present

## 2017-02-28 DIAGNOSIS — C7962 Secondary malignant neoplasm of left ovary: Secondary | ICD-10-CM

## 2017-02-28 DIAGNOSIS — G629 Polyneuropathy, unspecified: Secondary | ICD-10-CM

## 2017-02-28 LAB — CBC WITH DIFFERENTIAL/PLATELET
BASO%: 0.3 % (ref 0.0–2.0)
BASOS ABS: 0 10*3/uL (ref 0.0–0.1)
EOS ABS: 0.1 10*3/uL (ref 0.0–0.5)
EOS%: 2.5 % (ref 0.0–7.0)
HEMATOCRIT: 38.2 % (ref 34.8–46.6)
HGB: 11.6 g/dL (ref 11.6–15.9)
LYMPH#: 1 10*3/uL (ref 0.9–3.3)
LYMPH%: 27.5 % (ref 14.0–49.7)
MCH: 30 pg (ref 25.1–34.0)
MCHC: 30.4 g/dL — AB (ref 31.5–36.0)
MCV: 98.7 fL (ref 79.5–101.0)
MONO#: 0.5 10*3/uL (ref 0.1–0.9)
MONO%: 13.5 % (ref 0.0–14.0)
NEUT#: 2 10*3/uL (ref 1.5–6.5)
NEUT%: 56.2 % (ref 38.4–76.8)
Platelets: 238 10*3/uL (ref 145–400)
RBC: 3.87 10*6/uL (ref 3.70–5.45)
RDW: 17.5 % — ABNORMAL HIGH (ref 11.2–14.5)
WBC: 3.6 10*3/uL — ABNORMAL LOW (ref 3.9–10.3)

## 2017-02-28 LAB — COMPREHENSIVE METABOLIC PANEL
ALBUMIN: 3.2 g/dL — AB (ref 3.5–5.0)
ALK PHOS: 87 U/L (ref 40–150)
ALT: 18 U/L (ref 0–55)
ANION GAP: 8 meq/L (ref 3–11)
AST: 14 U/L (ref 5–34)
BUN: 24.7 mg/dL (ref 7.0–26.0)
CALCIUM: 10.3 mg/dL (ref 8.4–10.4)
CHLORIDE: 105 meq/L (ref 98–109)
CO2: 31 mEq/L — ABNORMAL HIGH (ref 22–29)
Creatinine: 1.1 mg/dL (ref 0.6–1.1)
EGFR: 52 mL/min/{1.73_m2} — AB (ref >90)
Glucose: 132 mg/dl (ref 70–140)
POTASSIUM: 4.2 meq/L (ref 3.5–5.1)
Sodium: 144 mEq/L (ref 136–145)
Total Bilirubin: 0.39 mg/dL (ref 0.20–1.20)
Total Protein: 6.8 g/dL (ref 6.4–8.3)

## 2017-02-28 LAB — UA PROTEIN, DIPSTICK - CHCC

## 2017-02-28 MED ORDER — SODIUM CHLORIDE 0.9 % IJ SOLN
10.0000 mL | INTRAMUSCULAR | Status: DC | PRN
  Administered 2017-02-28: 10 mL via INTRAVENOUS
  Filled 2017-02-28: qty 10

## 2017-02-28 MED ORDER — DEXAMETHASONE SODIUM PHOSPHATE 10 MG/ML IJ SOLN
5.0000 mg | Freq: Once | INTRAMUSCULAR | Status: AC
  Administered 2017-02-28: 5 mg via INTRAVENOUS

## 2017-02-28 MED ORDER — SODIUM CHLORIDE 0.9% FLUSH
10.0000 mL | INTRAVENOUS | Status: DC | PRN
  Administered 2017-02-28: 10 mL
  Filled 2017-02-28: qty 10

## 2017-02-28 MED ORDER — PACLITAXEL CHEMO INJECTION 300 MG/50ML
80.0000 mg/m2 | Freq: Once | INTRAVENOUS | Status: AC
  Administered 2017-02-28: 150 mg via INTRAVENOUS
  Filled 2017-02-28: qty 25

## 2017-02-28 MED ORDER — SODIUM CHLORIDE 0.9 % IV SOLN
Freq: Once | INTRAVENOUS | Status: AC
  Administered 2017-02-28: 10:00:00 via INTRAVENOUS

## 2017-02-28 MED ORDER — HEPARIN SOD (PORK) LOCK FLUSH 100 UNIT/ML IV SOLN
500.0000 [IU] | Freq: Once | INTRAVENOUS | Status: AC | PRN
  Administered 2017-02-28: 500 [IU]
  Filled 2017-02-28: qty 5

## 2017-02-28 MED ORDER — FAMOTIDINE IN NACL 20-0.9 MG/50ML-% IV SOLN
INTRAVENOUS | Status: AC
  Filled 2017-02-28: qty 50

## 2017-02-28 MED ORDER — DEXAMETHASONE SODIUM PHOSPHATE 10 MG/ML IJ SOLN
INTRAMUSCULAR | Status: AC
  Filled 2017-02-28: qty 1

## 2017-02-28 MED ORDER — SODIUM CHLORIDE 0.9 % IV SOLN
10.4000 mg/kg | Freq: Once | INTRAVENOUS | Status: AC
  Administered 2017-02-28: 900 mg via INTRAVENOUS
  Filled 2017-02-28: qty 32

## 2017-02-28 MED ORDER — DIPHENHYDRAMINE HCL 50 MG/ML IJ SOLN
INTRAMUSCULAR | Status: AC
  Filled 2017-02-28: qty 1

## 2017-02-28 MED ORDER — DIPHENHYDRAMINE HCL 50 MG/ML IJ SOLN
25.0000 mg | Freq: Once | INTRAMUSCULAR | Status: AC
  Administered 2017-02-28: 25 mg via INTRAVENOUS

## 2017-02-28 MED ORDER — FAMOTIDINE IN NACL 20-0.9 MG/50ML-% IV SOLN
20.0000 mg | Freq: Once | INTRAVENOUS | Status: AC
  Administered 2017-02-28: 20 mg via INTRAVENOUS

## 2017-02-28 MED ORDER — ALTEPLASE 2 MG IJ SOLR
2.0000 mg | Freq: Once | INTRAMUSCULAR | Status: DC | PRN
  Administered 2017-02-28: 2 mg
  Filled 2017-02-28: qty 2

## 2017-02-28 NOTE — Patient Instructions (Signed)
[facility] Discharge Instructions for Patients Receiving Chemotherapy  Today you received the following chemotherapy agents:  Avastin and Taxol.  To help prevent nausea and vomiting after your treatment, we encourage you to take your nausea medication as directed.   If you develop nausea and vomiting that is not controlled by your nausea medication, call the clinic.   BELOW ARE SYMPTOMS THAT SHOULD BE REPORTED IMMEDIATELY:  *FEVER GREATER THAN 100.5 F  *CHILLS WITH OR WITHOUT FEVER  NAUSEA AND VOMITING THAT IS NOT CONTROLLED WITH YOUR NAUSEA MEDICATION  *UNUSUAL SHORTNESS OF BREATH  *UNUSUAL BRUISING OR BLEEDING  TENDERNESS IN MOUTH AND THROAT WITH OR WITHOUT PRESENCE OF ULCERS  *URINARY PROBLEMS  *BOWEL PROBLEMS  UNUSUAL RASH Items with * indicate a potential emergency and should be followed up as soon as possible.  Feel free to call the clinic you have any questions or concerns. The clinic phone number is [phone].  Please show the [place] at check-in to the Emergency Department and triage nurse.

## 2017-02-28 NOTE — Progress Notes (Signed)
Pt port flushed x3 and repositioned with no blood return. Desk nurse for Dr. Benay Spice called and will administer cathflow. Blood was drawn peripherally. Porsche Cates LPN

## 2017-02-28 NOTE — Telephone Encounter (Signed)
Gave patient avs and calendar for appts.  °

## 2017-02-28 NOTE — Progress Notes (Signed)
[facility] OFFICE PROGRESS NOTE   Diagnosis: Ovarian cancer  INTERVAL HISTORY:   [name] returns as scheduled. She completed another treatment with Taxol/Avastin 02/14/2017. She tolerated the chemotherapy well. No change in neuropathy symptoms. She reports discomfort at the right lower back that is worse with coughing.  Objective:  Vital signs in last 24 hours:  Blood pressure (!) 143/62, pulse (!) 103, temperature 98.2 F (36.8 C), temperature source Oral, resp. rate (!) 22, height 5\' 1"  (1.549 m), weight 197 lb 6.4 oz (89.5 kg), SpO2 93 %.    HEENT: No thrush or ulcers Resp: Distant breath sounds, inspiratory rhonchi at the left posterior base, no respiratory distress Cardio: Regular rate and rhythm GI: No hepatosplenomegaly, no apparent ascites, no mass Vascular: No leg edema Musculoskeletal: Tender at the right low posterior ribs, no mass     Portacath/PICC-without erythema  Lab Results:  Lab Results  Component Value Date   WBC 3.6 (L) 02/28/2017   HGB 11.6 02/28/2017   HCT 38.2 02/28/2017   MCV 98.7 02/28/2017   PLT 238 02/28/2017   NEUTROABS 2.0 02/28/2017    CMP     Component Value Date/Time   NA 144 02/28/2017 0812   K 4.2 02/28/2017 0812   CL 105 10/18/2016 0336   CO2 31 (H) 02/28/2017 0812   GLUCOSE 132 02/28/2017 0812   BUN 24.7 02/28/2017 0812   CREATININE 1.1 02/28/2017 0812   CALCIUM 10.3 02/28/2017 0812   PROT 6.8 02/28/2017 0812   ALBUMIN 3.2 (L) 02/28/2017 0812   AST 14 02/28/2017 0812   ALT 18 02/28/2017 0812   ALKPHOS 87 02/28/2017 0812   BILITOT 0.39 02/28/2017 0812   GFRNONAA 52 (L) 10/18/2016 0336   GFRAA >60 10/18/2016 0336  CA 125  02/24/2017 -318.6  Medications: I have reviewed the patient's current medications.  Assessment/Plan: 1. 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 Significant improvement in malignant right pleural effusion.  Status post robotic-assisted laparoscopic hysterectomy with bilateral salpingoophorectomy, omentectomy, radical tumor debulking 12/29/2014. Per Dr. Serita Grit office note 01/14/2015 cytoreduction was optimal with residual disease remaining only in the 1 mm implants on the small intestine. 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 Cycle 1 adjuvant Taxol/carboplatin 01/20/2015 Cycle 2 adjuvant Taxol/carboplatin 02/10/2015 Cycle 3 adjuvant Taxol/carboplatin 03/10/2015 CA 125 on 1013 2016-42  CTs of the chest, abdomen, and pelvis 05/31/2015 with no evidence of progressive ovarian cancer  CTs of the chest, abdomen, and pelvis 08/07/2015 and 08/08/2015-no evidence of progressive ovarian cancer  Peritoneal studding noted at the time of the cholecystectomy procedure 08/09/2015 Elevated CA 125 10/07/2015  CT 10/07/2015 with  stricturing at the sigmoid colon, constipation, and omental nodularity  Initiation of salvage weekly Taxol chemotherapy 10/13/2015  Taxol changed to every 2 weeks beginning 01/19/2016 due to painful neuropathy.  CT scans 07/19/2016 with no acute findings. No features in the abdomen or pelvis to suggest recurrent disease.Stable mild fullness right intrarenal collecting system.  Elevated CA 125 treatment resumed with Taxol/Avastin on a 2 week schedule 09/27/2016  CT 02/12/2017-no evidence of carcinomatosis, no evidence of progressive metastatic disease  Taxol/Avastin continued every 2 weeks 2. COPD-followed by Dr. Lamonte Sakai. 3. Dyspnea secondary to COPD and the large right pleural effusion, status post therapeutic thoracentesis procedures 09/30/2014,10/09/2014, and 10/21/2014. Left thoracentesis 10/30/2014 4. Left nephrectomy as a child 5. Delayed nausea following cycle 1 Taxol/carboplatin, Aloxi/Emend added with cycle 2 with improvement. 6. Right lower extremity edema, right calf pain 12/09/2014. Negative venous Doppler 12/09/2014. 7. Diffuse pruritus following cycle 1 adjuvant Taxol/carboplatin 01/20/2015, no rash, resolved with a steroid dose pack 8. Thrombocytopenia second to chemotherapy, the carboplatin was dose reduced with cycle 2 adjuvant Taxol/carboplatin 02/10/2015 9. Chemotherapy-induced peripheral neuropathy-painful peripheral neuropathy involving the feet 01/19/2016.  10. Admission with acute cholecystitis 08/07/2015, status post a cholecystectomy 08/09/2015 11. Admission 10/08/2015 with abdominal pain/constipation, improved with bowel rest and laxatives 12. Mild right hydronephrosis noted on the CT 10/07/2015. Renal ultrasound 12/16/2015 with no hydronephrosis noted.No hydronephrosis on CT 07/19/2016.   Disposition:  She appears unchanged. The plan is to continue every 2 week Taxol/Avastin. Suspect the right lower chest wall discomfort/tenderness is related to a benign  musculoskeletal condition.  [name] will return for an office visit and chemotherapy in 2 weeks.  15 minutes were spent with the patient today. The majority of the time was used for counseling and coordination of care.  Lynn Romberg, MD  02/28/2017  9:07 AM

## 2017-03-01 ENCOUNTER — Telehealth: Payer: Self-pay | Admitting: *Deleted

## 2017-03-01 LAB — CA 125: CANCER ANTIGEN (CA) 125: 413.9 U/mL — AB (ref 0.0–38.1)

## 2017-03-01 NOTE — Telephone Encounter (Signed)
Call from pt requesting CA125 result. Result given, tumor marker is higher. She was disappointed to hear this but otherwise feels well. She will follow up in 2 weeks.

## 2017-03-07 ENCOUNTER — Encounter: Payer: Self-pay | Admitting: Emergency Medicine

## 2017-03-07 ENCOUNTER — Ambulatory Visit (INDEPENDENT_AMBULATORY_CARE_PROVIDER_SITE_OTHER): Payer: Medicare Other | Admitting: Emergency Medicine

## 2017-03-07 DIAGNOSIS — J441 Chronic obstructive pulmonary disease with (acute) exacerbation: Secondary | ICD-10-CM | POA: Diagnosis not present

## 2017-03-07 DIAGNOSIS — R05 Cough: Secondary | ICD-10-CM

## 2017-03-07 DIAGNOSIS — J9611 Chronic respiratory failure with hypoxia: Secondary | ICD-10-CM | POA: Diagnosis not present

## 2017-03-07 DIAGNOSIS — R053 Chronic cough: Secondary | ICD-10-CM

## 2017-03-07 DIAGNOSIS — I2510 Atherosclerotic heart disease of native coronary artery without angina pectoris: Secondary | ICD-10-CM

## 2017-03-07 MED ORDER — BENZONATATE 100 MG PO CAPS: 100.0000 mg | ORAL_CAPSULE | Freq: Four times a day (QID) | ORAL | 3 refills | Status: DC | PRN

## 2017-03-07 MED ORDER — TIOTROPIUM BROMIDE-OLODATEROL 2.5-2.5 MCG/ACT IN AERS: 2.0000 | INHALATION_SPRAY | Freq: Every day | RESPIRATORY_TRACT | 0 refills | Status: DC

## 2017-03-07 NOTE — Addendum Note (Signed)
Addended by: Georjean Mode on: 03/07/2017 05:44 PM   Modules accepted: Orders

## 2017-03-07 NOTE — Assessment & Plan Note (Signed)
Continue loratadine and Nexium  Continue Tessalon Perles 200mg  up to every 6 hours if needed for coughing.  Follow with Dr Lamonte Sakai in 4 months or sooner if you have any problems.

## 2017-03-07 NOTE — Progress Notes (Signed)
Subjective:    Patient ID: Lynn Ramirez, female    DOB: 04/09/1941, 76 y.o.   MRN: [id number]  HPI 76 yo woman, has been followed by Dr Gwenette Greet for COPD, pleural effusions in setting of chemotherapy for serous carcinoma possibly Fallopian. History is taken from the patient and from her husband. She completed chemo August, is following with Dr Benay Spice, Dr Denman George. She has been tired, is having some neuropathy. Her breathing is stable. She is on spiriva and symbicort. Uses SABA rarely. She is interested in doing a trial off of spiriva. I personally reviewed her pulmonary function testing from 05/06/2012. This showed severe obstructive lung disease without bronchodilator response.   ROV 05/06/15 -- follow-up visit for COPD and pleural effusions in the setting of serous carcinoma on chemotherapy. At her last visit we tried stopping Spiriva to see if she would tolerate this. She doesn't notice any difference in her breathing off this. Her chemo ended 03/10/15. She still has periods of severe fatigue that can influence her breathing. She remains on Symbicort. She wants the flu shot and Prevnar-13, has now been off chemo x 7 weeks. She underwent cardiac stress testing 10/11 that was reassuring. Also tested her LE perfusion > no abnormality.   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 She was recently started on Taxol as salvage chemotherapy. She is on Symbicort, was started back on Spiriva in mid December and believes that she has probably benefited. Symbicort seems to do the most for her. She does get fatigued w chemo, is associated with some dyspnea. She is able to go to the [facility] and exercise. She rarely uses albuterol. She does not cough often, she does have a hoarse voice recently due to allergies. She is on loratadine now.   ROV 04/04/16  -- this follow-up visit for history of COPD and bilateral pleural effusions, resolved. She also has a history of ovarian/fallopian carcinoma being treated with chemotherapy. Had difficulty w neuropathy on taxol.   At her last visit in April we did a trial off of Spiriva to see if she would miss this. She continued Symbicort at that time. She was able to tolerate coming off the Spiriva, no significant clinical change. Not needing her SABA. She is planning to go to [facility] in October.   ROV 10/05/16 -- visit for patient with a history of COPD, hx pleural effusions as detailed above. Her current BD is Symbicort. Her last CXR was 09/04/16 that I have reviewed - no pleural fluid. She is having more dyspnea on exertion. She restarted chemotherapy in march for progression of her fallopian CA. She uses albuterol 1-3x a day. She is on RA. Has not noticed any desats at home.   ROV 10/19/16 -- follow up visit for COPD, hx ovarian CA w pleural effusions. She went to the ED with dyspnea 4/11. She developed diarrhea (has been a problem since she has been back on chemo),  had some dyspnea in the evening that responded to SABA. Then she awakened from sleep w dyspnea. In the ED she received nebs, was started on steroids. She is on Stiolto. She is having some nasal congestion, dry nose from her O2. She feels better.   ROV 01/04/17 -- patient has a history of COPD, also ovarian cancer for which she's being treated with Taxol/Avastin (last 12/20/16). Currently managed with Stiolto, albuterol 2x a day. She has intermittent dyspnea, may relate to being outside and exposed to allergens. Has some hoarseness and chest tightness at those times. Oxygen on 2L/min. On loratadine. CXR 12/20/16 reviewed by me > no infiltrates. Treated with pred for possible bronchospasm twice since I last saw her.   Acute OV 01/15/17 -- patient has a history of COPD and immunosuppression due to chemotherapy for her ovarian cancer. She has hypoxemic respiratory  failure on 2 L/m. She has been treated frequently for suspected exacerbations that have been characterized by hoarseness, chest tightness, cough. 2 weeks ago we started chlorpheniramine, continued loratadine. She noted that last week she needed to use her nebulizer more. Then over last several days more cough. Productive of clear.    ROV 03/07/17 -- patient has a history of COPD, upper airway irritation syndrome, emesis suppression due to chemotherapy for her ovarian cancer. She was seen earlier this month by Dr [place] Bells dyspnea and wheezing. She was treated with pred, continued on Stiolto, started on Arnuity. She feels better, still has some cough and hoarse voice. She has been on tessalon before, has run out of this.    Review of Systems  As per HPI     Objective:   Physical Exam Vitals:   03/07/17 1649  BP: 122/66  Pulse: 80  SpO2: 94%  Weight: 200 lb 3.2 oz (90.8 kg)  Height: 5\' 1"  (1.549 m)   Gen: Pleasant, well-nourished, in no distress,  normal affect  ENT: No lesions,  mouth clear,  oropharynx clear, no postnasal drip, hoarse voice  Neck: No JVD, no stridor,   Lungs: No use of accessory muscles, no wheezing  Cardiovascular: RRR, heart sounds normal, no murmur or gallops, no peripheral edema  Musculoskeletal: No deformities, no cyanosis or clubbing  Neuro: alert, non focal  Skin: Warm, no lesions or rashes      Assessment & Plan:  COPD (chronic obstructive pulmonary disease) ([place]) Please continue your Stiolto and Arnuity as you are taking them  Use albuterol 2 puffs as needed for shortness of breath.   Chronic respiratory failure with hypoxia ([facility]) Continue your oxygen at 2L/min   Chronic cough Continue loratadine and Nexium  Continue Tessalon Perles 200mg  up to every 6 hours if needed for coughing.  Follow with Dr Lamonte Sakai in 4 months or sooner if you have any problems.  Baltazar Apo, MD, PhD 03/07/2017, 5:28 PM [facility] [phone] or if  no answer [phone]

## 2017-03-07 NOTE — Assessment & Plan Note (Signed)
Please continue your Stiolto and Arnuity as you are taking them  Use albuterol 2 puffs as needed for shortness of breath.

## 2017-03-07 NOTE — Assessment & Plan Note (Signed)
Continue your oxygen at 2 L/min. 

## 2017-03-07 NOTE — Patient Instructions (Addendum)
Please continue your Stiolto and Arnuity as you are taking them  Use albuterol 2 puffs as needed for shortness of breath.  Continue your oxygen at 2L/min  Continue loratadine and Nexium  Continue Tessalon Perles 200mg  up to every 6 hours if needed for coughing.  Follow with Dr Lamonte Sakai in 4 months or sooner if you have any problems.

## 2017-03-13 ENCOUNTER — Other Ambulatory Visit: Payer: Self-pay | Admitting: *Deleted

## 2017-03-13 ENCOUNTER — Other Ambulatory Visit: Payer: Self-pay | Admitting: Oncology

## 2017-03-13 ENCOUNTER — Other Ambulatory Visit: Payer: Self-pay | Admitting: Emergency Medicine

## 2017-03-13 DIAGNOSIS — C482 Malignant neoplasm of peritoneum, unspecified: Secondary | ICD-10-CM

## 2017-03-14 ENCOUNTER — Other Ambulatory Visit (HOSPITAL_BASED_OUTPATIENT_CLINIC_OR_DEPARTMENT_OTHER): Payer: Medicare Other

## 2017-03-14 ENCOUNTER — Ambulatory Visit (HOSPITAL_BASED_OUTPATIENT_CLINIC_OR_DEPARTMENT_OTHER): Payer: Medicare Other | Admitting: Nurse Practitioner

## 2017-03-14 ENCOUNTER — Ambulatory Visit: Payer: Medicare Other

## 2017-03-14 ENCOUNTER — Telehealth: Payer: Self-pay | Admitting: Nurse Practitioner

## 2017-03-14 ENCOUNTER — Ambulatory Visit (HOSPITAL_BASED_OUTPATIENT_CLINIC_OR_DEPARTMENT_OTHER): Payer: Medicare Other

## 2017-03-14 VITALS — BP 127/52 | HR 71 | Temp 97.8°F | Resp 18 | Ht 61.0 in | Wt 199.7 lb

## 2017-03-14 VITALS — BP 130/49 | HR 75

## 2017-03-14 DIAGNOSIS — C7961 Secondary malignant neoplasm of right ovary: Secondary | ICD-10-CM | POA: Diagnosis not present

## 2017-03-14 DIAGNOSIS — C5701 Malignant neoplasm of right fallopian tube: Secondary | ICD-10-CM

## 2017-03-14 DIAGNOSIS — Z95828 Presence of other vascular implants and grafts: Secondary | ICD-10-CM

## 2017-03-14 DIAGNOSIS — Z5112 Encounter for antineoplastic immunotherapy: Secondary | ICD-10-CM

## 2017-03-14 DIAGNOSIS — C482 Malignant neoplasm of peritoneum, unspecified: Secondary | ICD-10-CM | POA: Diagnosis not present

## 2017-03-14 DIAGNOSIS — C7962 Secondary malignant neoplasm of left ovary: Secondary | ICD-10-CM

## 2017-03-14 DIAGNOSIS — Z5111 Encounter for antineoplastic chemotherapy: Secondary | ICD-10-CM

## 2017-03-14 DIAGNOSIS — C569 Malignant neoplasm of unspecified ovary: Secondary | ICD-10-CM

## 2017-03-14 LAB — CBC WITH DIFFERENTIAL/PLATELET
BASO%: 0.9 % (ref 0.0–2.0)
Basophils Absolute: 0 10*3/uL (ref 0.0–0.1)
EOS ABS: 0.1 10*3/uL (ref 0.0–0.5)
EOS%: 2.9 % (ref 0.0–7.0)
HCT: 36.8 % (ref 34.8–46.6)
HEMOGLOBIN: 11.1 g/dL — AB (ref 11.6–15.9)
LYMPH%: 30.5 % (ref 14.0–49.7)
MCH: 29.4 pg (ref 25.1–34.0)
MCHC: 30.2 g/dL — ABNORMAL LOW (ref 31.5–36.0)
MCV: 97.4 fL (ref 79.5–101.0)
MONO#: 0.5 10*3/uL (ref 0.1–0.9)
MONO%: 14.8 % — AB (ref 0.0–14.0)
NEUT%: 50.9 % (ref 38.4–76.8)
NEUTROS ABS: 1.8 10*3/uL (ref 1.5–6.5)
Platelets: 259 10*3/uL (ref 145–400)
RBC: 3.78 10*6/uL (ref 3.70–5.45)
RDW: 17.6 % — AB (ref 11.2–14.5)
WBC: 3.4 10*3/uL — AB (ref 3.9–10.3)
lymph#: 1.1 10*3/uL (ref 0.9–3.3)

## 2017-03-14 LAB — COMPREHENSIVE METABOLIC PANEL
ALT: 13 U/L (ref 0–55)
ANION GAP: 8 meq/L (ref 3–11)
AST: 16 U/L (ref 5–34)
Albumin: 3.2 g/dL — ABNORMAL LOW (ref 3.5–5.0)
Alkaline Phosphatase: 73 U/L (ref 40–150)
BUN: 22.1 mg/dL (ref 7.0–26.0)
CALCIUM: 9.9 mg/dL (ref 8.4–10.4)
CHLORIDE: 102 meq/L (ref 98–109)
CO2: 30 meq/L — AB (ref 22–29)
Creatinine: 1 mg/dL (ref 0.6–1.1)
EGFR: 54 mL/min/{1.73_m2} — AB (ref >90)
Glucose: 115 mg/dl (ref 70–140)
POTASSIUM: 4.5 meq/L (ref 3.5–5.1)
SODIUM: 139 meq/L (ref 136–145)
TOTAL PROTEIN: 6.7 g/dL (ref 6.4–8.3)
Total Bilirubin: 0.39 mg/dL (ref 0.20–1.20)

## 2017-03-14 MED ORDER — DIPHENHYDRAMINE HCL 50 MG/ML IJ SOLN
25.0000 mg | Freq: Once | INTRAMUSCULAR | Status: AC
  Administered 2017-03-14: 25 mg via INTRAVENOUS

## 2017-03-14 MED ORDER — FAMOTIDINE IN NACL 20-0.9 MG/50ML-% IV SOLN
INTRAVENOUS | Status: AC
  Filled 2017-03-14: qty 50

## 2017-03-14 MED ORDER — PACLITAXEL CHEMO INJECTION 300 MG/50ML
80.0000 mg/m2 | Freq: Once | INTRAVENOUS | Status: AC
  Administered 2017-03-14: 150 mg via INTRAVENOUS
  Filled 2017-03-14: qty 25

## 2017-03-14 MED ORDER — DEXAMETHASONE SODIUM PHOSPHATE 10 MG/ML IJ SOLN
INTRAMUSCULAR | Status: AC
  Filled 2017-03-14: qty 1

## 2017-03-14 MED ORDER — DEXAMETHASONE SODIUM PHOSPHATE 10 MG/ML IJ SOLN
5.0000 mg | Freq: Once | INTRAMUSCULAR | Status: AC
  Administered 2017-03-14: 5 mg via INTRAVENOUS

## 2017-03-14 MED ORDER — HEPARIN SOD (PORK) LOCK FLUSH 100 UNIT/ML IV SOLN
500.0000 [IU] | Freq: Once | INTRAVENOUS | Status: AC | PRN
  Administered 2017-03-14: 500 [IU]
  Filled 2017-03-14: qty 5

## 2017-03-14 MED ORDER — FAMOTIDINE IN NACL 20-0.9 MG/50ML-% IV SOLN
20.0000 mg | Freq: Once | INTRAVENOUS | Status: AC
  Administered 2017-03-14: 20 mg via INTRAVENOUS

## 2017-03-14 MED ORDER — DIPHENHYDRAMINE HCL 50 MG/ML IJ SOLN
INTRAMUSCULAR | Status: AC
  Filled 2017-03-14: qty 1

## 2017-03-14 MED ORDER — SODIUM CHLORIDE 0.9 % IV SOLN
Freq: Once | INTRAVENOUS | Status: AC
  Administered 2017-03-14: 12:00:00 via INTRAVENOUS

## 2017-03-14 MED ORDER — GABAPENTIN 300 MG PO CAPS: 300.0000 mg | ORAL_CAPSULE | Freq: Every day | ORAL | 0 refills | Status: DC

## 2017-03-14 MED ORDER — SODIUM CHLORIDE 0.9% FLUSH
10.0000 mL | INTRAVENOUS | Status: DC | PRN
  Administered 2017-03-14: 10 mL
  Filled 2017-03-14: qty 10

## 2017-03-14 MED ORDER — SODIUM CHLORIDE 0.9 % IV SOLN
10.5000 mg/kg | Freq: Once | INTRAVENOUS | Status: AC
  Administered 2017-03-14: 900 mg via INTRAVENOUS
  Filled 2017-03-14: qty 32

## 2017-03-14 MED ORDER — SODIUM CHLORIDE 0.9 % IJ SOLN
10.0000 mL | INTRAMUSCULAR | Status: DC | PRN
  Administered 2017-03-14: 10 mL via INTRAVENOUS
  Filled 2017-03-14: qty 10

## 2017-03-14 NOTE — Progress Notes (Signed)
Lynn Ramirez   Diagnosis:  Ovarian cancer  INTERVAL HISTORY:   Lynn Ramirez returns as scheduled. She completed another treatment with Taxol/Avastin 02/28/2017. She denies nausea/vomiting. She had a single mouth sore which has resolved. She notes that her tongue is "sensitive" to spicy foods. She had a recent episode of constipation. She began a laxative. Bowels now moving regularly. She has stable numbness in the fingertips. She has intermittent numbness in the feet. No bleeding except with nose blowing. Dyspnea is better. She notes intermittent hoarseness.  Objective:  Vital signs in last 24 hours:  Blood pressure (!) 127/52, pulse 71, temperature 97.8 F (36.6 C), temperature source Oral, resp. rate 18, height 5\' 1"  (1.549 m), weight 199 lb 11.2 oz (90.6 kg), SpO2 97 %.    HEENT: No thrush or ulcers. Resp: Distant breath sounds. No respiratory distress. Cardio: Regular rate and rhythm. GI: Abdomen is soft. Mild tenderness at the left upper abdomen. No hepatomegaly. No mass. Vascular: Trace bilateral ankle edema.  Port-A-Cath without erythema.  Lab Results:  Lab Results  Component Value Date   WBC 3.4 (L) 03/14/2017   HGB 11.1 (L) 03/14/2017   HCT 36.8 03/14/2017   MCV 97.4 03/14/2017   PLT 259 03/14/2017   NEUTROABS 1.8 03/14/2017    Imaging:  No results found.  Medications: I have reviewed the patient's current medications.  Assessment/Plan: 1. 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, immunohistochemical profile consistent with a GYN primary, elevated CA 125   Staging CTs of the chest, abdomen, and pelvis on 10/06/2014 revealed a loculated right pleural effusion, ascites, and omental/mesenteric thickening   Cytology from peritoneal fluid 10/13/2014  revealed malignant cells consistent with metastatic adenocarcinoma Biopsy of an omental mass on 11/02/2014 revealed invasive serous carcinoma with psammoma bodies Cycle 1 Taxol/carboplatin 10/28/2014 Cycle 2 Taxol/carboplatin 11/18/2014 Cycle 3 Taxol/carboplatin 12/09/2014  CT scan 12/23/2014 with interval improvement in peritoneal carcinomatosis with near-complete resolution of ascites and decreased omental nodularity. Significant improvement in malignant right pleural effusion.  Status post robotic-assisted laparoscopic hysterectomy with bilateral salpingoophorectomy, omentectomy, radical tumor debulking 12/29/2014. Per Dr. Serita Grit office [facility] 01/14/2015 cytoreduction was optimal with residual disease remaining only in the 1 mm implants on the small intestine. 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 Cycle 1 adjuvant Taxol/carboplatin 01/20/2015 Cycle 2 adjuvant Taxol/carboplatin 02/10/2015 Cycle 3 adjuvant Taxol/carboplatin 03/10/2015 CA 125 on 1013 2016-42  CTs of the chest, abdomen, and pelvis 05/31/2015 with no evidence of progressive ovarian cancer  CTs of the chest, abdomen, and pelvis 08/07/2015 and 08/08/2015-no evidence of progressive ovarian cancer  Peritoneal studding noted at the time of the cholecystectomy procedure 08/09/2015 Elevated CA 125 10/07/2015  CT 10/07/2015 with stricturing at the sigmoid colon, constipation, and omental nodularity  Initiation of salvage weekly Taxol chemotherapy 10/13/2015  Taxol changed to every 2 weeks beginning 01/19/2016 due to painful neuropathy.  CT scans 07/19/2016 with no acute findings. No features in the abdomen or pelvis to suggest recurrent  disease.Stable mild fullness right intrarenal collecting system.  Elevated CA 125 treatment resumed with Taxol/Avastin on a 2 week schedule 09/27/2016  CT 02/12/2017-no evidence of carcinomatosis, no evidence of progressive metastatic disease  Taxol/Avastin continued every 2 weeks 2. COPD-followed by Dr. Lamonte Sakai. 3. Dyspnea secondary to  COPD and the large right pleural effusion, status post therapeutic thoracentesis procedures 09/30/2014,10/09/2014, and 10/21/2014. Left thoracentesis 10/30/2014 4. Left nephrectomy as a child 5. Delayed nausea following cycle 1 Taxol/carboplatin, Aloxi/Emend added with cycle 2 with improvement. 6. Right lower extremity edema, right calf pain 12/09/2014. Negative venous Doppler 12/09/2014. 7. Diffuse pruritus following cycle 1 adjuvant Taxol/carboplatin 01/20/2015, no rash, resolved with a steroid dose pack 8. Thrombocytopenia second to chemotherapy, the carboplatin was dose reduced with cycle 2 adjuvant Taxol/carboplatin 02/10/2015 9. Chemotherapy-induced peripheral neuropathy-painful peripheral neuropathy involving the feet 01/19/2016.  10. Admission with acute cholecystitis 08/07/2015, status post a cholecystectomy 08/09/2015 11. Admission 10/08/2015 with abdominal pain/constipation, improved with bowel rest and laxatives 12. Mild right hydronephrosis noted on the CT 10/07/2015. Renal ultrasound 12/16/2015 with no hydronephrosis noted.No hydronephrosis on CT 07/19/2016.   Disposition: Lynn Ramirez appears unchanged. The plan is to continue every 2 week Taxol/Avastin. She will return for a follow-up visit in 2 weeks. She will contact the office in the interim with any problems.    Ned Card ANP/GNP-BC   03/14/2017  10:10 AM

## 2017-03-14 NOTE — Patient Instructions (Signed)
[facility] Discharge Instructions for Patients Receiving Chemotherapy  Today you received the following chemotherapy agents Avastin/Taxol To help prevent nausea and vomiting after your treatment, we encourage you to take your nausea medication as prescribed. If you develop nausea and vomiting that is not controlled by your nausea medication, call the clinic.   BELOW ARE SYMPTOMS THAT SHOULD BE REPORTED IMMEDIATELY:  *FEVER GREATER THAN 100.5 F  *CHILLS WITH OR WITHOUT FEVER  NAUSEA AND VOMITING THAT IS NOT CONTROLLED WITH YOUR NAUSEA MEDICATION  *UNUSUAL SHORTNESS OF BREATH  *UNUSUAL BRUISING OR BLEEDING  TENDERNESS IN MOUTH AND THROAT WITH OR WITHOUT PRESENCE OF ULCERS  *URINARY PROBLEMS  *BOWEL PROBLEMS  UNUSUAL RASH Items with * indicate a potential emergency and should be followed up as soon as possible.  Feel free to call the clinic you have any questions or concerns. The clinic phone number is [phone].  Please show the [facility] at check-in to the Emergency Department and triage nurse.

## 2017-03-14 NOTE — Telephone Encounter (Signed)
Gave avs and calendar for October appointment

## 2017-03-15 LAB — CA 125: Cancer Antigen (CA) 125: 447.1 U/mL — ABNORMAL HIGH (ref 0.0–38.1)

## 2017-03-16 ENCOUNTER — Telehealth: Payer: Self-pay | Admitting: *Deleted

## 2017-03-16 NOTE — Telephone Encounter (Signed)
Telephone to patient advised lab results as directed below. Patient is starting to get really concerned about CA- 125 is slightly higher. She is not experiencing any symptoms. She questions the inhaler she was just prescribed, could this be causing the rise in her CA 125. Patient would like to discuss this with Dr. Benay Spice at her next appointment. Patient understand to call us with any concerns or changes in status in the interm.

## 2017-03-16 NOTE — Telephone Encounter (Signed)
-----   Message from [name], NP sent at 03/15/2017  8:57 AM EDT ----- Please let her know the CA-125 was slightly higher.

## 2017-03-19 ENCOUNTER — Other Ambulatory Visit: Payer: Self-pay | Admitting: Cardiology

## 2017-03-19 DIAGNOSIS — I2510 Atherosclerotic heart disease of native coronary artery without angina pectoris: Secondary | ICD-10-CM

## 2017-03-19 DIAGNOSIS — M7989 Other specified soft tissue disorders: Secondary | ICD-10-CM

## 2017-03-19 DIAGNOSIS — E7849 Other hyperlipidemia: Secondary | ICD-10-CM

## 2017-03-19 DIAGNOSIS — I2583 Coronary atherosclerosis due to lipid rich plaque: Secondary | ICD-10-CM

## 2017-03-19 DIAGNOSIS — M79604 Pain in right leg: Secondary | ICD-10-CM

## 2017-03-19 DIAGNOSIS — C799 Secondary malignant neoplasm of unspecified site: Secondary | ICD-10-CM

## 2017-03-23 ENCOUNTER — Other Ambulatory Visit: Payer: Self-pay | Admitting: Oncology

## 2017-03-28 ENCOUNTER — Other Ambulatory Visit (HOSPITAL_BASED_OUTPATIENT_CLINIC_OR_DEPARTMENT_OTHER): Payer: Medicare Other

## 2017-03-28 ENCOUNTER — Ambulatory Visit (HOSPITAL_BASED_OUTPATIENT_CLINIC_OR_DEPARTMENT_OTHER): Payer: Medicare Other

## 2017-03-28 ENCOUNTER — Telehealth: Payer: Self-pay | Admitting: Oncology

## 2017-03-28 ENCOUNTER — Ambulatory Visit (HOSPITAL_BASED_OUTPATIENT_CLINIC_OR_DEPARTMENT_OTHER): Payer: Medicare Other | Admitting: Nurse Practitioner

## 2017-03-28 ENCOUNTER — Ambulatory Visit: Payer: Medicare Other

## 2017-03-28 ENCOUNTER — Other Ambulatory Visit: Payer: Self-pay

## 2017-03-28 VITALS — BP 146/66

## 2017-03-28 DIAGNOSIS — C5701 Malignant neoplasm of right fallopian tube: Secondary | ICD-10-CM | POA: Diagnosis not present

## 2017-03-28 DIAGNOSIS — Z5112 Encounter for antineoplastic immunotherapy: Secondary | ICD-10-CM | POA: Diagnosis not present

## 2017-03-28 DIAGNOSIS — C561 Malignant neoplasm of right ovary: Secondary | ICD-10-CM

## 2017-03-28 DIAGNOSIS — C482 Malignant neoplasm of peritoneum, unspecified: Secondary | ICD-10-CM | POA: Diagnosis not present

## 2017-03-28 DIAGNOSIS — C7962 Secondary malignant neoplasm of left ovary: Secondary | ICD-10-CM | POA: Diagnosis not present

## 2017-03-28 DIAGNOSIS — C799 Secondary malignant neoplasm of unspecified site: Secondary | ICD-10-CM

## 2017-03-28 DIAGNOSIS — Z5111 Encounter for antineoplastic chemotherapy: Secondary | ICD-10-CM | POA: Diagnosis not present

## 2017-03-28 DIAGNOSIS — C7961 Secondary malignant neoplasm of right ovary: Secondary | ICD-10-CM | POA: Diagnosis not present

## 2017-03-28 DIAGNOSIS — C569 Malignant neoplasm of unspecified ovary: Secondary | ICD-10-CM

## 2017-03-28 DIAGNOSIS — Z95828 Presence of other vascular implants and grafts: Secondary | ICD-10-CM

## 2017-03-28 LAB — CBC WITH DIFFERENTIAL/PLATELET
BASO%: 0.5 % (ref 0.0–2.0)
Basophils Absolute: 0 10*3/uL (ref 0.0–0.1)
EOS ABS: 0.2 10*3/uL (ref 0.0–0.5)
EOS%: 5.8 % (ref 0.0–7.0)
HCT: 36.1 % (ref 34.8–46.6)
HGB: 10.9 g/dL — ABNORMAL LOW (ref 11.6–15.9)
LYMPH%: 29.6 % (ref 14.0–49.7)
MCH: 29.1 pg (ref 25.1–34.0)
MCHC: 30.2 g/dL — AB (ref 31.5–36.0)
MCV: 96.3 fL (ref 79.5–101.0)
MONO#: 0.5 10*3/uL (ref 0.1–0.9)
MONO%: 12.4 % (ref 0.0–14.0)
NEUT#: 2 10*3/uL (ref 1.5–6.5)
NEUT%: 51.7 % (ref 38.4–76.8)
PLATELETS: 256 10*3/uL (ref 145–400)
RBC: 3.75 10*6/uL (ref 3.70–5.45)
RDW: 17.7 % — ABNORMAL HIGH (ref 11.2–14.5)
WBC: 3.8 10*3/uL — ABNORMAL LOW (ref 3.9–10.3)
lymph#: 1.1 10*3/uL (ref 0.9–3.3)

## 2017-03-28 LAB — COMPREHENSIVE METABOLIC PANEL
ALT: 12 U/L (ref 0–55)
ANION GAP: 8 meq/L (ref 3–11)
AST: 15 U/L (ref 5–34)
Albumin: 3.3 g/dL — ABNORMAL LOW (ref 3.5–5.0)
Alkaline Phosphatase: 78 U/L (ref 40–150)
BUN: 23.7 mg/dL (ref 7.0–26.0)
CHLORIDE: 103 meq/L (ref 98–109)
CO2: 30 mEq/L — ABNORMAL HIGH (ref 22–29)
Calcium: 9.9 mg/dL (ref 8.4–10.4)
Creatinine: 1 mg/dL (ref 0.6–1.1)
EGFR: 58 mL/min/{1.73_m2} — AB (ref >90)
Glucose: 127 mg/dl (ref 70–140)
POTASSIUM: 3.9 meq/L (ref 3.5–5.1)
Sodium: 141 mEq/L (ref 136–145)
Total Bilirubin: 0.38 mg/dL (ref 0.20–1.20)
Total Protein: 6.9 g/dL (ref 6.4–8.3)

## 2017-03-28 MED ORDER — TRAMADOL HCL 50 MG PO TABS: 50.0000 mg | ORAL_TABLET | Freq: Two times a day (BID) | ORAL | 0 refills | Status: DC | PRN

## 2017-03-28 MED ORDER — DEXAMETHASONE SODIUM PHOSPHATE 10 MG/ML IJ SOLN
INTRAMUSCULAR | Status: AC
  Filled 2017-03-28: qty 1

## 2017-03-28 MED ORDER — SODIUM CHLORIDE 0.9 % IV SOLN
Freq: Once | INTRAVENOUS | Status: AC
  Administered 2017-03-28: 12:00:00 via INTRAVENOUS

## 2017-03-28 MED ORDER — DEXAMETHASONE SODIUM PHOSPHATE 10 MG/ML IJ SOLN
5.0000 mg | Freq: Once | INTRAMUSCULAR | Status: AC
  Administered 2017-03-28: 5 mg via INTRAVENOUS

## 2017-03-28 MED ORDER — DIPHENHYDRAMINE HCL 25 MG PO TABS
25.0000 mg | ORAL_TABLET | Freq: Once | ORAL | Status: AC
  Administered 2017-03-28: 25 mg via ORAL
  Filled 2017-03-28: qty 1

## 2017-03-28 MED ORDER — SODIUM CHLORIDE 0.9 % IV SOLN
10.4000 mg/kg | Freq: Once | INTRAVENOUS | Status: AC
  Administered 2017-03-28: 900 mg via INTRAVENOUS
  Filled 2017-03-28: qty 32

## 2017-03-28 MED ORDER — SODIUM CHLORIDE 0.9% FLUSH
10.0000 mL | INTRAVENOUS | Status: DC | PRN
  Administered 2017-03-28: 10 mL
  Filled 2017-03-28: qty 10

## 2017-03-28 MED ORDER — SODIUM CHLORIDE 0.9 % IJ SOLN
10.0000 mL | INTRAMUSCULAR | Status: DC | PRN
  Administered 2017-03-28: 10 mL via INTRAVENOUS
  Filled 2017-03-28: qty 10

## 2017-03-28 MED ORDER — PACLITAXEL CHEMO INJECTION 300 MG/50ML
80.0000 mg/m2 | Freq: Once | INTRAVENOUS | Status: AC
  Administered 2017-03-28: 150 mg via INTRAVENOUS
  Filled 2017-03-28: qty 25

## 2017-03-28 MED ORDER — FAMOTIDINE IN NACL 20-0.9 MG/50ML-% IV SOLN
20.0000 mg | Freq: Once | INTRAVENOUS | Status: AC
  Administered 2017-03-28: 20 mg via INTRAVENOUS

## 2017-03-28 MED ORDER — DIPHENHYDRAMINE HCL 25 MG PO CAPS
ORAL_CAPSULE | ORAL | Status: AC
  Filled 2017-03-28: qty 1

## 2017-03-28 MED ORDER — HEPARIN SOD (PORK) LOCK FLUSH 100 UNIT/ML IV SOLN
500.0000 [IU] | Freq: Once | INTRAVENOUS | Status: AC | PRN
  Administered 2017-03-28: 500 [IU]
  Filled 2017-03-28: qty 5

## 2017-03-28 MED ORDER — FAMOTIDINE IN NACL 20-0.9 MG/50ML-% IV SOLN
INTRAVENOUS | Status: AC
  Filled 2017-03-28: qty 50

## 2017-03-28 NOTE — Telephone Encounter (Signed)
Tramadol refill called in

## 2017-03-28 NOTE — Patient Instructions (Signed)
[facility] Discharge Instructions for Patients Receiving Chemotherapy  Today you received the following chemotherapy agents Avastin/Taxol To help prevent nausea and vomiting after your treatment, we encourage you to take your nausea medication as prescribed. If you develop nausea and vomiting that is not controlled by your nausea medication, call the clinic.   BELOW ARE SYMPTOMS THAT SHOULD BE REPORTED IMMEDIATELY:  *FEVER GREATER THAN 100.5 F  *CHILLS WITH OR WITHOUT FEVER  NAUSEA AND VOMITING THAT IS NOT CONTROLLED WITH YOUR NAUSEA MEDICATION  *UNUSUAL SHORTNESS OF BREATH  *UNUSUAL BRUISING OR BLEEDING  TENDERNESS IN MOUTH AND THROAT WITH OR WITHOUT PRESENCE OF ULCERS  *URINARY PROBLEMS  *BOWEL PROBLEMS  UNUSUAL RASH Items with * indicate a potential emergency and should be followed up as soon as possible.  Feel free to call the clinic you have any questions or concerns. The clinic phone number is [phone].  Please show the [facility] at check-in to the Emergency Department and triage nurse.

## 2017-03-28 NOTE — Telephone Encounter (Signed)
Gave patient calendar for October. Avs report will be printed in infusion.

## 2017-03-28 NOTE — Progress Notes (Signed)
[facility] OFFICE PROGRESS NOTE   Diagnosis:  Ovarian cancer  INTERVAL HISTORY:   Lynn Ramirez returns as scheduled. She completed another treatment with Taxol/Avastin 02/28/2017. She has occasional mild nausea. No mouth sores. She has had a couple of episodes of constipation relieved with a laxative. Neuropathy symptoms are better overall. Last night she had some burning discomfort in her feet and legs. She took an oxycodone tablet with good relief.  Objective:  Vital signs in last 24 hours:  Blood pressure (!) 145/69, pulse 80, temperature 97.7 F (36.5 C), temperature source Oral, resp. rate 18, weight 199 lb 4.8 oz (90.4 kg), SpO2 96 %.    HEENT: No thrush or ulcers. Resp: Distant breath sounds. Cardio: Regular rate and rhythm. GI: Abdomen is soft. Tenderness at the right upper abdomen. No hepatomegaly. No mass. Vascular: No leg edema. Port-A-Cath without erythema.  Lab Results:  Lab Results  Component Value Date   WBC 3.8 (L) 03/28/2017   HGB 10.9 (L) 03/28/2017   HCT 36.1 03/28/2017   MCV 96.3 03/28/2017   PLT 256 03/28/2017   NEUTROABS 2.0 03/28/2017    Imaging:  No results found.  Medications: I have reviewed the patient's current medications.  Assessment/Plan: 1. 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, immunohistochemical profile consistent with a GYN primary, elevated CA 125   Staging CTs of the chest, abdomen, and pelvis on 10/06/2014 revealed a loculated right pleural effusion, ascites, and omental/mesenteric thickening   Cytology from peritoneal fluid 10/13/2014 revealed malignant cells consistent with metastatic adenocarcinoma Biopsy of an omental mass on 11/02/2014 revealed invasive serous carcinoma with psammoma bodies Cycle 1 Taxol/carboplatin  10/28/2014 Cycle 2 Taxol/carboplatin 11/18/2014 Cycle 3 Taxol/carboplatin 12/09/2014  CT scan 12/23/2014 with interval improvement in peritoneal carcinomatosis with near-complete resolution of ascites and decreased omental nodularity. Significant improvement in malignant right pleural effusion.  Status post robotic-assisted laparoscopic hysterectomy with bilateral salpingoophorectomy, omentectomy, radical tumor debulking 12/29/2014. Per Dr. Serita Grit office note 01/14/2015 cytoreduction was optimal with residual disease remaining only in the 1 mm implants on the small intestine. 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 Cycle 1 adjuvant Taxol/carboplatin 01/20/2015 Cycle 2 adjuvant Taxol/carboplatin 02/10/2015 Cycle 3 adjuvant Taxol/carboplatin 03/10/2015 CA 125 on 1013 2016-42  CTs of the chest, abdomen, and pelvis 05/31/2015 with no evidence of progressive ovarian cancer  CTs of the chest, abdomen, and pelvis 08/07/2015 and 08/08/2015-no evidence of progressive ovarian cancer  Peritoneal studding noted at the time of the cholecystectomy procedure 08/09/2015 Elevated CA 125 10/07/2015  CT 10/07/2015 with stricturing at the sigmoid colon, constipation, and omental nodularity  Initiation of salvage weekly Taxol chemotherapy 10/13/2015  Taxol changed to every 2 weeks beginning 01/19/2016 due to painful neuropathy.  CT scans 07/19/2016 with no acute findings. No features in the abdomen or pelvis to suggest recurrent disease.Stable mild fullness right intrarenal collecting system.  Elevated CA 125 treatment resumed with Taxol/Avastin on a 2 week schedule 09/27/2016  CT 02/12/2017-no evidence of  carcinomatosis, no evidence of progressive metastatic disease  Taxol/Avastin continued every 2 weeks 2. COPD-followed by Dr. Lamonte Sakai. 3. Dyspnea secondary to COPD and the large right pleural effusion, status post therapeutic thoracentesis procedures 09/30/2014,10/09/2014, and 10/21/2014. Left thoracentesis 10/30/2014 4. Left nephrectomy as a child 5. Delayed nausea following cycle  1 Taxol/carboplatin, Aloxi/Emend added with cycle 2 with improvement. 6. Right lower extremity edema, right calf pain 12/09/2014. Negative venous Doppler 12/09/2014. 7. Diffuse pruritus following cycle 1 adjuvant Taxol/carboplatin 01/20/2015, no rash, resolved with a steroid dose pack 8. Thrombocytopenia second to chemotherapy, the carboplatin was dose reduced with cycle 2 adjuvant Taxol/carboplatin 02/10/2015 9. Chemotherapy-induced peripheral neuropathy-painful peripheral neuropathy involving the feet 01/19/2016.  10. Admission with acute cholecystitis 08/07/2015, status post a cholecystectomy 08/09/2015 11. Admission 10/08/2015 with abdominal pain/constipation, improved with bowel rest and laxatives 12. Mild right hydronephrosis noted on the CT 10/07/2015. Renal ultrasound 12/16/2015 with no hydronephrosis noted.No hydronephrosis on CT 07/19/2016.     Disposition: Lynn Ramirez appears unchanged. The CA-125 has been higher on the past few measurements. For now the plan is to continue Taxol/Avastin every 2 weeks. We will follow-up on the CA-125 from today. She will return for a follow-up visit in 2 weeks. She will contact the office in the interim with any problems.    Ned Card ANP/GNP-BC   03/28/2017  11:14 AM

## 2017-03-29 LAB — CA 125: CANCER ANTIGEN (CA) 125: 451.1 U/mL — AB (ref 0.0–38.1)

## 2017-04-02 ENCOUNTER — Other Ambulatory Visit: Payer: Self-pay | Admitting: Oncology

## 2017-04-10 DIAGNOSIS — M170 Bilateral primary osteoarthritis of knee: Secondary | ICD-10-CM | POA: Diagnosis not present

## 2017-04-10 DIAGNOSIS — Z1231 Encounter for screening mammogram for malignant neoplasm of breast: Secondary | ICD-10-CM | POA: Diagnosis not present

## 2017-04-10 LAB — HM MAMMOGRAPHY

## 2017-04-11 ENCOUNTER — Ambulatory Visit (HOSPITAL_BASED_OUTPATIENT_CLINIC_OR_DEPARTMENT_OTHER): Payer: Medicare Other | Admitting: Oncology

## 2017-04-11 ENCOUNTER — Telehealth: Payer: Self-pay | Admitting: Oncology

## 2017-04-11 ENCOUNTER — Ambulatory Visit (HOSPITAL_BASED_OUTPATIENT_CLINIC_OR_DEPARTMENT_OTHER): Payer: Medicare Other

## 2017-04-11 ENCOUNTER — Ambulatory Visit: Payer: Medicare Other

## 2017-04-11 ENCOUNTER — Other Ambulatory Visit (HOSPITAL_BASED_OUTPATIENT_CLINIC_OR_DEPARTMENT_OTHER): Payer: Medicare Other

## 2017-04-11 VITALS — BP 163/68 | HR 73

## 2017-04-11 VITALS — BP 152/72 | HR 95 | Temp 98.2°F | Resp 18 | Ht 61.0 in | Wt 201.1 lb

## 2017-04-11 DIAGNOSIS — C7961 Secondary malignant neoplasm of right ovary: Secondary | ICD-10-CM

## 2017-04-11 DIAGNOSIS — C482 Malignant neoplasm of peritoneum, unspecified: Secondary | ICD-10-CM

## 2017-04-11 DIAGNOSIS — Z5112 Encounter for antineoplastic immunotherapy: Secondary | ICD-10-CM

## 2017-04-11 DIAGNOSIS — C5701 Malignant neoplasm of right fallopian tube: Secondary | ICD-10-CM

## 2017-04-11 DIAGNOSIS — C7962 Secondary malignant neoplasm of left ovary: Secondary | ICD-10-CM

## 2017-04-11 DIAGNOSIS — C799 Secondary malignant neoplasm of unspecified site: Secondary | ICD-10-CM | POA: Diagnosis not present

## 2017-04-11 DIAGNOSIS — Z5111 Encounter for antineoplastic chemotherapy: Secondary | ICD-10-CM | POA: Diagnosis not present

## 2017-04-11 DIAGNOSIS — C569 Malignant neoplasm of unspecified ovary: Secondary | ICD-10-CM

## 2017-04-11 DIAGNOSIS — C561 Malignant neoplasm of right ovary: Secondary | ICD-10-CM

## 2017-04-11 LAB — COMPREHENSIVE METABOLIC PANEL
ALT: 14 U/L (ref 0–55)
ANION GAP: 9 meq/L (ref 3–11)
AST: 14 U/L (ref 5–34)
Albumin: 3.4 g/dL — ABNORMAL LOW (ref 3.5–5.0)
Alkaline Phosphatase: 89 U/L (ref 40–150)
BILIRUBIN TOTAL: 0.45 mg/dL (ref 0.20–1.20)
BUN: 20.9 mg/dL (ref 7.0–26.0)
CO2: 27 meq/L (ref 22–29)
CREATININE: 1 mg/dL (ref 0.6–1.1)
Calcium: 10.3 mg/dL (ref 8.4–10.4)
Chloride: 103 mEq/L (ref 98–109)
EGFR: 57 mL/min/{1.73_m2} — ABNORMAL LOW (ref >90)
GLUCOSE: 141 mg/dL — AB (ref 70–140)
Potassium: 4.7 mEq/L (ref 3.5–5.1)
SODIUM: 140 meq/L (ref 136–145)
TOTAL PROTEIN: 7 g/dL (ref 6.4–8.3)

## 2017-04-11 LAB — CBC WITH DIFFERENTIAL/PLATELET
BASO%: 1.1 % (ref 0.0–2.0)
Basophils Absolute: 0 10*3/uL (ref 0.0–0.1)
EOS%: 0.4 % (ref 0.0–7.0)
Eosinophils Absolute: 0 10*3/uL (ref 0.0–0.5)
HCT: 35.3 % (ref 34.8–46.6)
HEMOGLOBIN: 11.4 g/dL — AB (ref 11.6–15.9)
LYMPH#: 0.7 10*3/uL — AB (ref 0.9–3.3)
LYMPH%: 14.6 % (ref 14.0–49.7)
MCH: 29.6 pg (ref 25.1–34.0)
MCHC: 32.4 g/dL (ref 31.5–36.0)
MCV: 91.1 fL (ref 79.5–101.0)
MONO#: 0.3 10*3/uL (ref 0.1–0.9)
MONO%: 6.1 % (ref 0.0–14.0)
NEUT%: 77.8 % — ABNORMAL HIGH (ref 38.4–76.8)
NEUTROS ABS: 3.5 10*3/uL (ref 1.5–6.5)
PLATELETS: 281 10*3/uL (ref 145–400)
RBC: 3.87 10*6/uL (ref 3.70–5.45)
RDW: 19.5 % — AB (ref 11.2–14.5)
WBC: 4.5 10*3/uL (ref 3.9–10.3)

## 2017-04-11 LAB — UA PROTEIN, DIPSTICK - CHCC: Protein, ur: <30 mg/dL

## 2017-04-11 MED ORDER — HEPARIN SOD (PORK) LOCK FLUSH 100 UNIT/ML IV SOLN
500.0000 [IU] | Freq: Once | INTRAVENOUS | Status: AC | PRN
  Administered 2017-04-11: 500 [IU]
  Filled 2017-04-11: qty 5

## 2017-04-11 MED ORDER — SODIUM CHLORIDE 0.9% FLUSH
10.0000 mL | INTRAVENOUS | Status: DC | PRN
  Administered 2017-04-11: 10 mL
  Filled 2017-04-11: qty 10

## 2017-04-11 MED ORDER — FAMOTIDINE IN NACL 20-0.9 MG/50ML-% IV SOLN
20.0000 mg | Freq: Once | INTRAVENOUS | Status: AC
  Administered 2017-04-11: 20 mg via INTRAVENOUS

## 2017-04-11 MED ORDER — DEXAMETHASONE SODIUM PHOSPHATE 10 MG/ML IJ SOLN
INTRAMUSCULAR | Status: AC
  Filled 2017-04-11: qty 1

## 2017-04-11 MED ORDER — DEXAMETHASONE SODIUM PHOSPHATE 10 MG/ML IJ SOLN
5.0000 mg | Freq: Once | INTRAMUSCULAR | Status: AC
  Administered 2017-04-11: 5 mg via INTRAVENOUS

## 2017-04-11 MED ORDER — FAMOTIDINE IN NACL 20-0.9 MG/50ML-% IV SOLN
INTRAVENOUS | Status: AC
  Filled 2017-04-11: qty 50

## 2017-04-11 MED ORDER — DIPHENHYDRAMINE HCL 25 MG PO CAPS
ORAL_CAPSULE | ORAL | Status: AC
  Filled 2017-04-11: qty 1

## 2017-04-11 MED ORDER — SODIUM CHLORIDE 0.9 % IV SOLN
Freq: Once | INTRAVENOUS | Status: AC
  Administered 2017-04-11: 10:00:00 via INTRAVENOUS

## 2017-04-11 MED ORDER — SODIUM CHLORIDE 0.9 % IV SOLN
900.0000 mg | Freq: Once | INTRAVENOUS | Status: AC
  Administered 2017-04-11: 900 mg via INTRAVENOUS
  Filled 2017-04-11: qty 32

## 2017-04-11 MED ORDER — DIPHENHYDRAMINE HCL 25 MG PO TABS
25.0000 mg | ORAL_TABLET | Freq: Once | ORAL | Status: AC
  Administered 2017-04-11: 25 mg via ORAL
  Filled 2017-04-11: qty 1

## 2017-04-11 MED ORDER — PACLITAXEL CHEMO INJECTION 300 MG/50ML
80.0000 mg/m2 | Freq: Once | INTRAVENOUS | Status: AC
  Administered 2017-04-11: 150 mg via INTRAVENOUS
  Filled 2017-04-11: qty 25

## 2017-04-11 NOTE — Progress Notes (Signed)
[facility] OFFICE PROGRESS NOTE   Diagnosis: Peritoneal carcinoma  INTERVAL HISTORY:   Lynn Ramirez returns as scheduled. She completed another cycle of Taxol/Avastin on 03/28/2017. No neuropathy symptoms. No symptom of thrombosis. No new complaint. She continues to have knee pain. She had bilateral knee injections yesterday.  Objective:  Vital signs in last 24 hours:  Blood pressure (!) 152/72, pulse 95, temperature 98.2 F (36.8 C), temperature source Oral, resp. rate 18, height 5\' 1"  (1.549 m), weight 201 lb 1.6 oz (91.2 kg), SpO2 96 %.    HEENT: No thrush Resp: Distant breath sounds, no respiratory distress Cardio: Regular rate and rhythm GI: No hepatomegaly, mild tenderness in the right upper abdomen, no mass, no apparent ascites Vascular: No leg edema    Lab Results:  Lab Results  Component Value Date   WBC 4.5 04/11/2017   HGB 11.4 (L) 04/11/2017   HCT 35.3 04/11/2017   MCV 91.1 04/11/2017   PLT 281 04/11/2017   NEUTROABS 3.5 04/11/2017    CMP     Component Value Date/Time   NA 140 04/11/2017 0811   K 4.7 04/11/2017 0811   CL 105 10/18/2016 0336   CO2 27 04/11/2017 0811   GLUCOSE 141 (H) 04/11/2017 0811   BUN 20.9 04/11/2017 0811   CREATININE 1.0 04/11/2017 0811   CALCIUM 10.3 04/11/2017 0811   PROT 7.0 04/11/2017 0811   ALBUMIN 3.4 (L) 04/11/2017 0811   AST 14 04/11/2017 0811   ALT 14 04/11/2017 0811   ALKPHOS 89 04/11/2017 0811   BILITOT 0.45 04/11/2017 0811   GFRNONAA 52 (L) 10/18/2016 0336   GFRAA >60 10/18/2016 0336   03/28/2017: CA 125-451  Medications: I have reviewed the patient's current medications.  Assessment/Plan: 1. 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 Significant improvement in malignant right pleural effusion.  Status post robotic-assisted laparoscopic hysterectomy with bilateral salpingoophorectomy, omentectomy, radical tumor debulking 12/29/2014. Per Dr. Serita Grit office note 01/14/2015 cytoreduction was optimal with residual disease remaining only in the 1 mm implants on the small intestine. 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 Cycle 1 adjuvant Taxol/carboplatin 01/20/2015 Cycle 2 adjuvant Taxol/carboplatin 02/10/2015 Cycle 3 adjuvant Taxol/carboplatin 03/10/2015 CA 125 on 1013 2016-42  CTs of the chest, abdomen, and pelvis 05/31/2015 with no evidence of progressive ovarian cancer  CTs of the chest, abdomen, and pelvis 08/07/2015 and 08/08/2015-no evidence of progressive ovarian cancer  Peritoneal studding noted at the time of the cholecystectomy procedure 08/09/2015 Elevated CA 125 10/07/2015  CT 10/07/2015 with  stricturing at the sigmoid colon, constipation, and omental nodularity  Initiation of salvage weekly Taxol chemotherapy 10/13/2015  Taxol changed to every 2 weeks beginning 01/19/2016 due to painful neuropathy.  CT scans 07/19/2016 with no acute  findings. No features in the abdomen or pelvis to suggest recurrent disease.Stable mild fullness right intrarenal collecting system.  Elevated CA 125 treatment resumed with Taxol/Avastin on a 2 week schedule 09/27/2016  CT 02/12/2017-no evidence of carcinomatosis, no evidence of progressive metastatic disease  Taxol/Avastin continued every 2 weeks 2. COPD-followed by Dr. Lamonte Sakai. 3. Dyspnea secondary to COPD and the large right pleural effusion, status post therapeutic thoracentesis procedures 09/30/2014,10/09/2014, and 10/21/2014. Left thoracentesis 10/30/2014 4. Left nephrectomy as a child 5. Delayed nausea following cycle 1 Taxol/carboplatin, Aloxi/Emend added with cycle 2 with improvement. 6. Right lower extremity edema, right calf pain 12/09/2014. Negative venous Doppler 12/09/2014. 7. Diffuse pruritus following cycle 1 adjuvant Taxol/carboplatin 01/20/2015, no rash, resolved with a steroid dose pack 8. Thrombocytopenia second to chemotherapy, the carboplatin was dose reduced with cycle 2 adjuvant Taxol/carboplatin 02/10/2015 9. Chemotherapy-induced peripheral neuropathy-painful peripheral neuropathy involving the feet 01/19/2016.  10. Admission with acute cholecystitis 08/07/2015, status post a cholecystectomy 08/09/2015 11. Admission 10/08/2015 with abdominal pain/constipation, improved with bowel rest and laxatives 12. Mild right hydronephrosis noted on the CT 10/07/2015. Renal ultrasound 12/16/2015 with no hydronephrosis noted.No hydronephrosis on CT 07/19/2016.  Disposition:  She appears unchanged. The plan is to continue every 2 week Taxol/Avastin. We will discontinue the Taxol/Avastin for a progressive rise in the CA 125.  She will  receive an influenza vaccine today.  Mr. [name] will return for an office visit and chemotherapy in 2 weeks.  15 minutes were spent with the patient today. The majority of the time was used for counseling and coordination of care.  Donneta Romberg, MD  04/11/2017  9:46 AM

## 2017-04-11 NOTE — Patient Instructions (Signed)
[facility] Discharge Instructions for Patients Receiving Chemotherapy  Today you received the following chemotherapy agents Avastin and Taxol  To help prevent nausea and vomiting after your treatment, we encourage you to take your nausea medication as directed   If you develop nausea and vomiting that is not controlled by your nausea medication, call the clinic.   BELOW ARE SYMPTOMS THAT SHOULD BE REPORTED IMMEDIATELY:  *FEVER GREATER THAN 100.5 F  *CHILLS WITH OR WITHOUT FEVER  NAUSEA AND VOMITING THAT IS NOT CONTROLLED WITH YOUR NAUSEA MEDICATION  *UNUSUAL SHORTNESS OF BREATH  *UNUSUAL BRUISING OR BLEEDING  TENDERNESS IN MOUTH AND THROAT WITH OR WITHOUT PRESENCE OF ULCERS  *URINARY PROBLEMS  *BOWEL PROBLEMS  UNUSUAL RASH Items with * indicate a potential emergency and should be followed up as soon as possible.  Feel free to call the clinic should you have any questions or concerns. The clinic phone number is [phone].  Please show the CHEMO ALERT CARD at check-in to the Emergency Department and triage nurse.   

## 2017-04-11 NOTE — Telephone Encounter (Signed)
Scheduled appt per 10/3 los - Gave patient AVS and calender per los.  

## 2017-04-12 ENCOUNTER — Telehealth: Payer: Self-pay | Admitting: *Deleted

## 2017-04-12 ENCOUNTER — Encounter: Payer: Self-pay | Admitting: General Practice

## 2017-04-12 ENCOUNTER — Telehealth: Payer: Self-pay | Admitting: Emergency Medicine

## 2017-04-12 LAB — CA 125: Cancer Antigen (CA) 125: 517.5 U/mL — ABNORMAL HIGH (ref 0.0–38.1)

## 2017-04-12 NOTE — Telephone Encounter (Signed)
Message from pt requesting CA125 result. Per Dr. Benay Spice: Tumor marker is higher, if higher next time we'll change treatment. Left message for pt to call back.

## 2017-04-12 NOTE — Telephone Encounter (Signed)
Pt returned call, informed her of elevated CA125. Plan is to check it again next treatment, will change treatment if higher then. She voiced disappointment, stated "I'm not going to sit around and worry, I'm going to be productive!" We discussed the fact that she feels well and she stated she will focus on that. Pt will follow up as scheduled.

## 2017-04-12 NOTE — Telephone Encounter (Signed)
Form is in DR Byrum;s folder.  Will forward to [place] to f/u on once completed.

## 2017-04-13 NOTE — Telephone Encounter (Signed)
Form has been filled out and placed in RB's sign folder. Folder has been given to [facility]. I will update message as soon as this folder is returned to me.

## 2017-04-13 NOTE — Telephone Encounter (Signed)
Form has been filled out and signed by RB. There is one part of the form that the pt needs to sign that she didn't. I have spoken with her and she will come by and sign this and then I will fax it. Form has been placed in my "Ria Comment To Do" folder in RB's look at [place].

## 2017-04-18 NOTE — Telephone Encounter (Signed)
Form still has not been signed by the pt. I attempted to reach out to her but a I didn't get an answer and I couldn't leave a message. Will try back.

## 2017-04-19 ENCOUNTER — Ambulatory Visit (HOSPITAL_COMMUNITY): Payer: Medicare Other | Attending: Cardiovascular Disease

## 2017-04-19 ENCOUNTER — Other Ambulatory Visit: Payer: Self-pay

## 2017-04-19 DIAGNOSIS — Z79899 Other long term (current) drug therapy: Secondary | ICD-10-CM | POA: Diagnosis not present

## 2017-04-19 DIAGNOSIS — I10 Essential (primary) hypertension: Secondary | ICD-10-CM | POA: Diagnosis not present

## 2017-04-19 DIAGNOSIS — J449 Chronic obstructive pulmonary disease, unspecified: Secondary | ICD-10-CM | POA: Insufficient documentation

## 2017-04-19 DIAGNOSIS — I2510 Atherosclerotic heart disease of native coronary artery without angina pectoris: Secondary | ICD-10-CM | POA: Insufficient documentation

## 2017-04-19 DIAGNOSIS — E785 Hyperlipidemia, unspecified: Secondary | ICD-10-CM | POA: Insufficient documentation

## 2017-04-19 DIAGNOSIS — C569 Malignant neoplasm of unspecified ovary: Secondary | ICD-10-CM | POA: Diagnosis not present

## 2017-04-19 NOTE — Telephone Encounter (Signed)
Called spoke with patient's spouse who reported that pt is finishing up her 2D Echo and then they will come to the office for pt to sign the form.  Spouse will come get it, take it to patient in the car and then bring it back up to the office.  Ria Comment is aware Will sign off

## 2017-04-22 ENCOUNTER — Other Ambulatory Visit: Payer: Self-pay | Admitting: Oncology

## 2017-04-23 ENCOUNTER — Telehealth: Payer: Self-pay | Admitting: Cardiology

## 2017-04-23 ENCOUNTER — Encounter: Payer: Self-pay | Admitting: Oncology

## 2017-04-23 NOTE — Telephone Encounter (Signed)
New message     Pt is returning your call about echo

## 2017-04-23 NOTE — Telephone Encounter (Signed)
Notified the Lynn Ramirez that per Dr Meda Coffee, her echo showed borderline decreased LV global longitudinal strain at -16%, unchanged from the previous report, and she should continue the same chemotherapy.  Lynn Ramirez verbalized understanding and agrees with this plan.

## 2017-04-24 ENCOUNTER — Ambulatory Visit: Payer: Medicare Other | Admitting: Emergency Medicine

## 2017-04-25 ENCOUNTER — Ambulatory Visit: Payer: Medicare Other

## 2017-04-25 ENCOUNTER — Ambulatory Visit (HOSPITAL_BASED_OUTPATIENT_CLINIC_OR_DEPARTMENT_OTHER): Payer: Medicare Other | Admitting: Oncology

## 2017-04-25 ENCOUNTER — Other Ambulatory Visit: Payer: Self-pay | Admitting: *Deleted

## 2017-04-25 ENCOUNTER — Telehealth: Payer: Self-pay

## 2017-04-25 ENCOUNTER — Ambulatory Visit (HOSPITAL_BASED_OUTPATIENT_CLINIC_OR_DEPARTMENT_OTHER): Payer: Medicare Other

## 2017-04-25 ENCOUNTER — Other Ambulatory Visit (HOSPITAL_BASED_OUTPATIENT_CLINIC_OR_DEPARTMENT_OTHER): Payer: Medicare Other

## 2017-04-25 VITALS — BP 144/86 | HR 95 | Temp 97.5°F | Resp 18 | Ht 61.0 in | Wt 191.3 lb

## 2017-04-25 VITALS — BP 157/65

## 2017-04-25 DIAGNOSIS — Z23 Encounter for immunization: Secondary | ICD-10-CM

## 2017-04-25 DIAGNOSIS — Z5112 Encounter for antineoplastic immunotherapy: Secondary | ICD-10-CM

## 2017-04-25 DIAGNOSIS — C5701 Malignant neoplasm of right fallopian tube: Secondary | ICD-10-CM | POA: Diagnosis not present

## 2017-04-25 DIAGNOSIS — R1110 Vomiting, unspecified: Secondary | ICD-10-CM

## 2017-04-25 DIAGNOSIS — C799 Secondary malignant neoplasm of unspecified site: Secondary | ICD-10-CM

## 2017-04-25 DIAGNOSIS — C7962 Secondary malignant neoplasm of left ovary: Secondary | ICD-10-CM | POA: Diagnosis not present

## 2017-04-25 DIAGNOSIS — C7961 Secondary malignant neoplasm of right ovary: Secondary | ICD-10-CM | POA: Diagnosis not present

## 2017-04-25 DIAGNOSIS — C482 Malignant neoplasm of peritoneum, unspecified: Secondary | ICD-10-CM | POA: Diagnosis not present

## 2017-04-25 DIAGNOSIS — C569 Malignant neoplasm of unspecified ovary: Secondary | ICD-10-CM

## 2017-04-25 DIAGNOSIS — R197 Diarrhea, unspecified: Secondary | ICD-10-CM

## 2017-04-25 LAB — COMPREHENSIVE METABOLIC PANEL
ALT: 16 U/L (ref 0–55)
AST: 17 U/L (ref 5–34)
Albumin: 3.6 g/dL (ref 3.5–5.0)
Alkaline Phosphatase: 77 U/L (ref 40–150)
Anion Gap: 10 mEq/L (ref 3–11)
BILIRUBIN TOTAL: 0.58 mg/dL (ref 0.20–1.20)
BUN: 28.6 mg/dL — ABNORMAL HIGH (ref 7.0–26.0)
CO2: 29 meq/L (ref 22–29)
Calcium: 9.9 mg/dL (ref 8.4–10.4)
Chloride: 102 mEq/L (ref 98–109)
Creatinine: 1 mg/dL (ref 0.6–1.1)
EGFR: 53 mL/min/{1.73_m2} — AB (ref >60)
GLUCOSE: 105 mg/dL (ref 70–140)
Potassium: 4.4 mEq/L (ref 3.5–5.1)
SODIUM: 141 meq/L (ref 136–145)
TOTAL PROTEIN: 7 g/dL (ref 6.4–8.3)

## 2017-04-25 LAB — CBC WITH DIFFERENTIAL/PLATELET
BASO%: 0.3 % (ref 0.0–2.0)
BASOS ABS: 0 10*3/uL (ref 0.0–0.1)
EOS ABS: 0.1 10*3/uL (ref 0.0–0.5)
EOS%: 3.8 % (ref 0.0–7.0)
HEMATOCRIT: 41.6 % (ref 34.8–46.6)
HEMOGLOBIN: 12.8 g/dL (ref 11.6–15.9)
LYMPH#: 1 10*3/uL (ref 0.9–3.3)
LYMPH%: 27.3 % (ref 14.0–49.7)
MCH: 28.8 pg (ref 25.1–34.0)
MCHC: 30.8 g/dL — ABNORMAL LOW (ref 31.5–36.0)
MCV: 93.5 fL (ref 79.5–101.0)
MONO#: 0.8 10*3/uL (ref 0.1–0.9)
MONO%: 20.8 % — AB (ref 0.0–14.0)
NEUT%: 47.8 % (ref 38.4–76.8)
NEUTROS ABS: 1.8 10*3/uL (ref 1.5–6.5)
PLATELETS: 220 10*3/uL (ref 145–400)
RBC: 4.45 10*6/uL (ref 3.70–5.45)
RDW: 17.8 % — AB (ref 11.2–14.5)
WBC: 3.7 10*3/uL — AB (ref 3.9–10.3)

## 2017-04-25 MED ORDER — HEPARIN SOD (PORK) LOCK FLUSH 100 UNIT/ML IV SOLN
500.0000 [IU] | Freq: Once | INTRAVENOUS | Status: AC | PRN
  Administered 2017-04-25: 500 [IU]
  Filled 2017-04-25: qty 5

## 2017-04-25 MED ORDER — DEXAMETHASONE SODIUM PHOSPHATE 10 MG/ML IJ SOLN
5.0000 mg | Freq: Once | INTRAMUSCULAR | Status: AC
  Administered 2017-04-25: 5 mg via INTRAVENOUS

## 2017-04-25 MED ORDER — FAMOTIDINE IN NACL 20-0.9 MG/50ML-% IV SOLN
INTRAVENOUS | Status: AC
  Filled 2017-04-25: qty 50

## 2017-04-25 MED ORDER — SODIUM CHLORIDE 0.9 % IV SOLN
80.0000 mg/m2 | Freq: Once | INTRAVENOUS | Status: AC
  Administered 2017-04-25: 150 mg via INTRAVENOUS
  Filled 2017-04-25: qty 25

## 2017-04-25 MED ORDER — SODIUM CHLORIDE 0.9 % IV SOLN
Freq: Once | INTRAVENOUS | Status: AC
Start: 2017-04-25 — End: 2017-04-25
  Administered 2017-04-25: 11:00:00 via INTRAVENOUS

## 2017-04-25 MED ORDER — DIPHENOXYLATE-ATROPINE 2.5-0.025 MG PO TABS: ORAL_TABLET | ORAL | 0 refills | Status: DC

## 2017-04-25 MED ORDER — FAMOTIDINE IN NACL 20-0.9 MG/50ML-% IV SOLN
20.0000 mg | Freq: Once | INTRAVENOUS | Status: AC
  Administered 2017-04-25: 20 mg via INTRAVENOUS

## 2017-04-25 MED ORDER — SODIUM CHLORIDE 0.9 % IV SOLN
500.0000 mL | Freq: Once | INTRAVENOUS | Status: AC
  Administered 2017-04-25: 500 mL via INTRAVENOUS

## 2017-04-25 MED ORDER — INFLUENZA VAC SPLIT HIGH-DOSE 0.5 ML IM SUSY
0.5000 mL | PREFILLED_SYRINGE | Freq: Once | INTRAMUSCULAR | Status: AC
  Administered 2017-04-25: 0.5 mL via INTRAMUSCULAR
  Filled 2017-04-25: qty 0.5

## 2017-04-25 MED ORDER — DIPHENHYDRAMINE HCL 25 MG PO TABS
25.0000 mg | ORAL_TABLET | Freq: Once | ORAL | Status: AC
  Administered 2017-04-25: 25 mg via ORAL
  Filled 2017-04-25: qty 1

## 2017-04-25 MED ORDER — SODIUM CHLORIDE 0.9% FLUSH
10.0000 mL | INTRAVENOUS | Status: DC | PRN
  Administered 2017-04-25: 10 mL
  Filled 2017-04-25: qty 10

## 2017-04-25 MED ORDER — DEXAMETHASONE SODIUM PHOSPHATE 10 MG/ML IJ SOLN
INTRAMUSCULAR | Status: AC
  Filled 2017-04-25: qty 1

## 2017-04-25 MED ORDER — BEVACIZUMAB CHEMO INJECTION 400 MG/16ML
10.4000 mg/kg | Freq: Once | INTRAVENOUS | Status: AC
  Administered 2017-04-25: 900 mg via INTRAVENOUS
  Filled 2017-04-25: qty 32

## 2017-04-25 MED ORDER — DIPHENHYDRAMINE HCL 25 MG PO CAPS
ORAL_CAPSULE | ORAL | Status: AC
  Filled 2017-04-25: qty 1

## 2017-04-25 MED ORDER — DIPHENHYDRAMINE HCL 50 MG/ML IJ SOLN: 25.0000 mg | Freq: Once | INTRAMUSCULAR | Status: DC

## 2017-04-25 MED ORDER — SODIUM CHLORIDE 0.9 % IV SOLN: Freq: Once | INTRAVENOUS | Status: DC

## 2017-04-25 NOTE — Progress Notes (Signed)
[facility] OFFICE PROGRESS NOTE   Diagnosis: Fallopian tube carcinoma  INTERVAL HISTORY:   Lynn Ramirez returns as scheduled. She completed another treatment with Taxol/Avastin on 04/11/2017. She reports an episode of vomiting and diarrhea on 04/19/2017. This occurred again last night. She is not nausea treated this morning. She had an episode of diarrhea early this morning. She is having little to eat since yesterday. She is drinking liquids. She reports an intentional weight loss with changing her diet. Objective:  Vital signs in last 24 hours:  Blood pressure (!) 144/86, pulse 95, temperature (!) 97.5 F (36.4 C), temperature source Oral, resp. rate 18, height 5\' 1"  (1.549 m), weight 191 lb 4.8 oz (86.8 kg), SpO2 100 %.    HEENT: No thrush Resp: Distant breath sounds, inspiratory rhonchi at the left posterior base, no respiratory distress Cardio: Regular rate and rhythm GI: No hepatosplenomegaly, nontender, nondistended Vascular: No leg edema, diminished skin turgor at the arms   Portacath/PICC-without erythema  Lab Results:  Lab Results  Component Value Date   WBC 3.7 (L) 04/25/2017   HGB 12.8 04/25/2017   HCT 41.6 04/25/2017   MCV 93.5 04/25/2017   PLT 220 04/25/2017   NEUTROABS 1.8 04/25/2017    CMP     Component Value Date/Time   NA 141 04/25/2017 0901   K 4.4 04/25/2017 0901   CL 105 10/18/2016 0336   CO2 29 04/25/2017 0901   GLUCOSE 105 04/25/2017 0901   BUN 28.6 (H) 04/25/2017 0901   CREATININE 1.0 04/25/2017 0901   CALCIUM 9.9 04/25/2017 0901   PROT 7.0 04/25/2017 0901   ALBUMIN 3.6 04/25/2017 0901   AST 17 04/25/2017 0901   ALT 16 04/25/2017 0901   ALKPHOS 77 04/25/2017 0901   BILITOT 0.58 04/25/2017 0901   GFRNONAA 52 (L) 10/18/2016 0336   GFRAA >60 10/18/2016 0336   04/11/17- Ca125- 517 Medications: I have reviewed the patient's current medications.  Assessment/Plan: 1. Malignant right pleural effusion-cytology revealed  metastatic adenocarcinoma with papillary features, immunohistochemical profile consistent with a GYN primary, elevated CA 125   Staging CTs of the chest, abdomen, and pelvis on 10/06/2014 revealed a loculated right pleural effusion, ascites, and omental/mesenteric thickening   Cytology from peritoneal fluid 10/13/2014 revealed malignant cells consistent with metastatic adenocarcinoma Biopsy of an omental mass on 11/02/2014 revealed invasive serous carcinoma with psammoma bodies Cycle 1 Taxol/carboplatin 10/28/2014 Cycle 2 Taxol/carboplatin 11/18/2014 Cycle 3 Taxol/carboplatin 12/09/2014  CT scan 12/23/2014 with interval improvement in peritoneal carcinomatosis with near-complete resolution of ascites and decreased omental nodularity. Significant improvement in malignant right pleural effusion.  Status post robotic-assisted laparoscopic hysterectomy with bilateral salpingoophorectomy, omentectomy, radical tumor debulking 12/29/2014. Per Dr. Serita Grit office note 01/14/2015 cytoreduction was optimal with residual disease remaining only in the 1 mm implants on the small intestine. 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 Cycle 1 adjuvant Taxol/carboplatin 01/20/2015 Cycle 2 adjuvant Taxol/carboplatin 02/10/2015 Cycle 3 adjuvant Taxol/carboplatin 03/10/2015 CA 125 on 1013 2016-42  CTs of the chest, abdomen, and pelvis 05/31/2015 with no evidence of progressive ovarian cancer  CTs of the chest, abdomen, and pelvis 08/07/2015 and 08/08/2015-no evidence of progressive ovarian cancer  Peritoneal  studding noted at the time of the cholecystectomy procedure 08/09/2015 Elevated CA 125 10/07/2015  CT 10/07/2015 with stricturing at  the sigmoid colon, constipation, and omental nodularity  Initiation of salvage weekly Taxol chemotherapy 10/13/2015  Taxol changed to every 2 weeks beginning 01/19/2016 due to painful neuropathy.  CT scans 07/19/2016 with no acute findings. No features in the abdomen or pelvis to suggest recurrent disease.Stable mild fullness right intrarenal collecting system.  Elevated CA 125 treatment resumed with Taxol/Avastin on a 2 week schedule 09/27/2016  CT 02/12/2017-no evidence of carcinomatosis, no evidence of progressive metastatic disease  Taxol/Avastin continued every 2 weeks 2. COPD-followed by Dr. Lamonte Sakai. 3. Dyspnea secondary to COPD and the large right pleural effusion, status post therapeutic thoracentesis procedures 09/30/2014,10/09/2014, and 10/21/2014. Left thoracentesis 10/30/2014 4. Left nephrectomy as a child 5. Delayed nausea following cycle 1 Taxol/carboplatin, Aloxi/Emend added with cycle 2 with improvement. 6. Right lower extremity edema, right calf pain 12/09/2014. Negative venous Doppler 12/09/2014. 7. Diffuse pruritus following cycle 1 adjuvant Taxol/carboplatin 01/20/2015, no rash, resolved with a steroid dose pack 8. Thrombocytopenia second to chemotherapy, the carboplatin was dose reduced with cycle 2 adjuvant Taxol/carboplatin 02/10/2015 9. Chemotherapy-induced peripheral neuropathy-painful peripheral neuropathy involving the feet 01/19/2016.  10. Admission with acute cholecystitis 08/07/2015, status post a cholecystectomy 08/09/2015 11. Admission 10/08/2015 with abdominal pain/constipation, improved with bowel rest and laxatives 12. Mild right hydronephrosis noted on the CT 10/07/2015. Renal ultrasound 12/16/2015 with no hydronephrosis noted.No hydronephrosis on CT 07/19/2016.  Disposition:  [name] appears unchanged. I am  concerned the recent episodes of vomiting and diarrhea may be related to early obstructive symptoms. She wants to proceed with chemotherapy today. We will plan for a restaging CT if the CA 125 is higher today.  She will receive additional intravenous fluids today. She will contact us if she has recurrent nausea/vomiting and is not able to tolerate liquids.  She will return for an office visit in 2 weeks. We discussed potential salvage treatment options. She may be a candidate for repeat treatment with carboplatin.  25 minutes were spent with the patient today.    Donneta Romberg, MD  04/25/2017  10:56 AM

## 2017-04-25 NOTE — Telephone Encounter (Signed)
Printed avs and calender of upcoming appointment that was already scheduled.per 10/17 los

## 2017-04-25 NOTE — Patient Instructions (Signed)
[facility] Discharge Instructions for Patients Receiving Chemotherapy  Today you received the following chemotherapy agents Taxol, Avastin.   To help prevent nausea and vomiting after your treatment, we encourage you to take your nausea medication as prescribed.   If you develop nausea and vomiting that is not controlled by your nausea medication, call the clinic.   BELOW ARE SYMPTOMS THAT SHOULD BE REPORTED IMMEDIATELY:  *FEVER GREATER THAN 100.5 F  *CHILLS WITH OR WITHOUT FEVER  NAUSEA AND VOMITING THAT IS NOT CONTROLLED WITH YOUR NAUSEA MEDICATION  *UNUSUAL SHORTNESS OF BREATH  *UNUSUAL BRUISING OR BLEEDING  TENDERNESS IN MOUTH AND THROAT WITH OR WITHOUT PRESENCE OF ULCERS  *URINARY PROBLEMS  *BOWEL PROBLEMS  UNUSUAL RASH Items with * indicate a potential emergency and should be followed up as soon as possible.  Feel free to call the clinic should you have any questions or concerns. The clinic phone number is [phone].  Please show the [facility] at check-in to the Emergency Department and triage nurse.

## 2017-04-25 NOTE — Patient Instructions (Signed)

## 2017-04-26 ENCOUNTER — Ambulatory Visit (INDEPENDENT_AMBULATORY_CARE_PROVIDER_SITE_OTHER): Payer: Medicare Other | Admitting: Cardiology

## 2017-04-26 ENCOUNTER — Encounter: Payer: Self-pay | Admitting: Cardiology

## 2017-04-26 VITALS — BP 136/68 | HR 61 | Ht 61.0 in | Wt 194.0 lb

## 2017-04-26 DIAGNOSIS — I10 Essential (primary) hypertension: Secondary | ICD-10-CM | POA: Diagnosis not present

## 2017-04-26 DIAGNOSIS — I2583 Coronary atherosclerosis due to lipid rich plaque: Secondary | ICD-10-CM

## 2017-04-26 DIAGNOSIS — I429 Cardiomyopathy, unspecified: Secondary | ICD-10-CM | POA: Insufficient documentation

## 2017-04-26 DIAGNOSIS — I2510 Atherosclerotic heart disease of native coronary artery without angina pectoris: Secondary | ICD-10-CM | POA: Diagnosis not present

## 2017-04-26 DIAGNOSIS — Z79899 Other long term (current) drug therapy: Secondary | ICD-10-CM

## 2017-04-26 DIAGNOSIS — I5032 Chronic diastolic (congestive) heart failure: Secondary | ICD-10-CM | POA: Diagnosis not present

## 2017-04-26 DIAGNOSIS — I428 Other cardiomyopathies: Secondary | ICD-10-CM

## 2017-04-26 LAB — CA 125: Cancer Antigen (CA) 125: 497.3 U/mL — ABNORMAL HIGH (ref 0.0–38.1)

## 2017-04-26 NOTE — Patient Instructions (Signed)
Medication Instructions:   Your physician recommends that you continue on your current medications as directed. Please refer to the Current Medication list given to you today.     Testing/Procedures:  Your physician has requested that you have an echocardiogram WITH STRAIN. Echocardiography is a painless test that uses sound waves to create images of your heart. It provides your doctor with information about the size and shape of your heart and how well your heart's chambers and valves are working. This procedure takes approximately one hour. There are no restrictions for this procedure.  PLEASE SCHEDULE ECHO FOR January 2019--PLEASE DO ECHO WITH STRAIN FOR PATIENT TAKES CHEMO      Follow-Up:  3 MONTHS WITH DR Meda Coffee       If you need a refill on your cardiac medications before your next appointment, please call your pharmacy.

## 2017-04-26 NOTE — Progress Notes (Signed)
Cardiology Office Note    Date:  04/26/2017  ID:  Lynn Ramirez, DOB Jul 02, 1941, MRN [id number] PCP:  Lynn Minium, MD  Cardiologist:  Dr. Meda Coffee   Chief Complaint: 3 months follow-up  History of Present Illness:  Lynn Ramirez is a 76 y.o. 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 She has had prior malignant right pleural effusion c/w GYN primary, thus has been treated by oncology for metastatic ovarian cancer and peritoneal carcinomatosis. She has undergone laparoscopic hysterectomy with bilateral salpingoophorectomy, omentectomy, radical tumor debulking and is now treated with Taxol and Avastin. She has been followed by Dr. Meda Coffee for her history of abnormal calcifications seen on a CT scan in 2016. Lexiscan nuclear stress test 04/2015 did not show any significant ischemia, only fixed defect in apex location and breat attenuation. She also has had issues with HTN while on Avastin.   04/26/2017 - patient states that she feels about the same, she continues to use home oxygen via nasal cannula 24/7. Earlier disease year she was taken away from her chemotherapy for 2 months to get break from side effects, she is being followed by Dr. Learta Codding. He mention in his note that she may be a candidate for repeat treatment with carboplatin. She continues treatment with Taxol/ Avastin, last cycle completed on 04/11/2017.  2D echo: 10/2016 showed EF 55-60%, grade 1 DD, indeterminate LV filling pressure 01/03/17: 55-60%, global longitudinal strain -16% 04/19/17: 50-55%, global longitudinal strain -16%  Past Medical History:  Diagnosis Date  . CKD (chronic kidney disease), stage II   . COPD (chronic obstructive pulmonary disease) ([facility])   . Coronary artery calcification seen on  CT scan   . Difficulty sleeping   . Eczema    hands  . Emphysema   . GERD (gastroesophageal reflux disease)   . H/O hydronephrosis   . History of transfusion    age 73  . Hyperlipidemia   . Hypertension   . Ovarian cancer ([facility]) dx'd 09/2014   metastatic - prior malignant R pleural effusion and peritoneal carcinomatosis  . S/p nephrectomy     Past Surgical History:  Procedure Laterality Date  . ABDOMINAL HYSTERECTOMY    . CHOLECYSTECTOMY N/A 08/09/2015   Procedure: Attempted LAPAROSCOPIC coverted  open CHOLECYSTECTOMY WITH INTRAOPERATIVE CHOLANGIOGRAM;  Surgeon: Autumn Messing III, MD;  Location: [facility];  Service: General;  Laterality: N/A;  . HAND SURGERY Right 1980's  . LAPAROTOMY N/A 12/29/2014   Procedure:  LAPAROTOMY;  Surgeon: Everitt Amber, MD;  Location: [facility];  Service: Gynecology;  Laterality: N/A;  . NEPHRECTOMY    . ROBOTIC ASSISTED TOTAL HYSTERECTOMY WITH BILATERAL SALPINGO OOPHERECTOMY Bilateral 12/29/2014   Procedure: ROBOTIC ASSISTED TOTAL LAPAROSCOPIC HYSTERECTOMY WITH BILATERAL SALPINGO OOPHORECTOMY AND OOMENTECTOMY WITH RADICAL TUMOR [place] ;  Surgeon: Everitt Amber, MD;  Location: [facility];  Service: Gynecology;  Laterality: Bilateral;  . THORACENTESIS     several  . TONSILLECTOMY    . TUBAL LIGATION      Current Medications: Current Meds  Medication Sig  . Acetaminophen (TYLENOL) 325 MG CAPS Take 1 tablet by mouth 3 (three) times daily as needed (pain).   Marland Kitchen albuterol (PROVENTIL) (2.5 MG/3ML) 0.083% nebulizer solution Take 3 mLs (2.5 mg total) by nebulization every 6 (six) hours as needed for wheezing or shortness of breath.  Marland Kitchen  antiseptic oral rinse (BIOTENE) LIQD 15 mLs by Mouth Rinse route 3 (three) times daily.  Marland Kitchen atorvastatin (LIPITOR) 10 MG tablet TAKE 1 TABLET(10 MG) BY MOUTH DAILY  . benzonatate (TESSALON) 100 MG capsule Take 1 capsule (100 mg total) by mouth every 6 (six) hours as needed for cough.  . carvedilol (COREG) 3.125 MG tablet TAKE 1 TABLET(3.125 MG) BY  MOUTH TWICE DAILY WITH A MEAL  . cholecalciferol (VITAMIN D) 1000 units tablet Take 1,000 Units by mouth daily.  . diphenoxylate-atropine (LOMOTIL) 2.5-0.025 MG tablet TAKE 2 TABLETS BY MOUTH FOUR TIMES DAILY AS NEEDED FOR DIARRHEA OR LOOSE STOOLS  . esomeprazole (NEXIUM) 20 MG capsule Take 20 mg by mouth daily at 12 noon.  . fluocinonide cream (LIDEX) 8.31 % Apply 1 application topically 2 (two) times daily as needed (For ezcema).   . Fluticasone Furoate (ARNUITY ELLIPTA) 200 MCG/ACT AEPB Inhale 1 puff into the lungs daily.  Marland Kitchen gabapentin (NEURONTIN) 300 MG capsule Take 1 capsule (300 mg total) by mouth at bedtime.  . lidocaine-prilocaine (EMLA) cream Apply to port site one hour prior to use. Do not rub in. Cover with plastic.  Marland Kitchen loratadine (CLARITIN) 10 MG tablet Take 10 mg by mouth daily.  . mirabegron ER (MYRBETRIQ) 25 MG TB24 tablet Take 1 tablet (25 mg total) by mouth daily.  . Omega-3 Fatty Acids (OMEGA-3 PO) Take 1 capsule by mouth daily.  . ondansetron (ZOFRAN-ODT) 4 MG disintegrating tablet Take 1 tablet (4 mg total) by mouth every 8 (eight) hours as needed for nausea or vomiting.  Marland Kitchen oxyCODONE-acetaminophen (PERCOCET/ROXICET) 5-325 MG tablet Take 1 tablet by mouth every 6 (six) hours as needed for severe pain.  Marland Kitchen PROAIR HFA 108 (90 Base) MCG/ACT inhaler INHALE 2 PUFFS INTO THE LUNGS FOUR TIMES DAILY AS NEEDED FOR WHEEZING  . Tiotropium Bromide-Olodaterol (STIOLTO RESPIMAT) 2.5-2.5 MCG/ACT AERS Inhale 2 puffs into the lungs daily.  . traMADol (ULTRAM) 50 MG tablet Take 1 tablet (50 mg total) by mouth every 12 (twelve) hours as needed. for pain  . vitamin B-12 (CYANOCOBALAMIN) 100 MCG tablet Take 100 mcg by mouth daily. Reported on 01/19/2016  . zolpidem (AMBIEN) 5 MG tablet Take 1 tablet (5 mg total) by mouth at bedtime as needed for sleep.    Allergies:   Latex and Penicillins   Social History   Social History  . Marital status: Married    Spouse name: N/A  . Number of children: 3    . Years of education: N/A   Occupational History  . RETIRED Retired   Social History Main Topics  . Smoking status: Former Smoker    Packs/day: 1.00    Years: 40.00    Types: Cigarettes    Quit date: 07/10/2013  . Smokeless tobacco: Never Used  . Alcohol use 0.0 oz/week     Comment: 1-2 a week  . Drug use: No  . Sexual activity: Not Asked   Other Topics Concern  . None   Social History Narrative  . None    Family History:  Family History  Problem Relation Age of Onset  . Diabetes Father   . Lung cancer Father 44       metastasis to liver  . Cancer Father        liver  . Pancreatic cancer Mother 52  . Stroke Paternal Grandfather   . Heart disease Maternal Aunt   . Diabetes Paternal Uncle   . Heart Problems Maternal Grandmother   . Heart Problems Maternal  Grandfather   . Diabetes Paternal Grandmother   . Stroke Paternal Grandmother   . Heart Problems Paternal Uncle   . Cancer Cousin        unknown type  . Breast cancer Cousin        dx. 31s  . Leukemia Cousin        dx. 16-17  . Colon cancer Neg Hx   . Stomach cancer Neg Hx    ROS:   Please see the history of present illness. Has rare nosebleeds after chemo but no significant dripping - only minimal blood when wiping that resolves quickly (has been discussed with oncology) All other systems are reviewed and otherwise negative.   PHYSICAL EXAM:   VS:  BP 136/68   Pulse 61   Ht 5\' 1"  (1.549 m)   Wt 194 lb (88 kg)   SpO2 98%   BMI 36.66 kg/m   BMI: Body mass index is 36.66 kg/m. GEN: Well nourished, well developed obese WF, in no acute distress , on O2 via [place] HEENT: normocephalic, atraumatic Neck: no JVD, carotid bruits, or masses Cardiac: RRR; no murmurs, rubs, or gallops, no edema  Respiratory:  Diffusely diminished but no wheezing, rales or rhonchi, normal work of breathing GI: soft, nontender, nondistended, + BS MS: no deformity or atrophy  Skin: warm and dry, no rash Neuro:  Alert and Oriented x  3, Strength and sensation are intact, follows commands Psych: euthymic mood, full affect  Wt Readings from Last 3 Encounters:  04/26/17 194 lb (88 kg)  04/25/17 191 lb 4.8 oz (86.8 kg)  04/11/17 201 lb 1.6 oz (91.2 kg)    Studies/Labs Reviewed:   EKG:  EKG was not done today  Recent Labs: 08/07/2016: Magnesium 1.9 10/18/2016: B Natriuretic Peptide 32.5 04/25/2017: ALT 16; BUN 28.6; Creatinine 1.0; HGB 12.8; Platelets 220; Potassium 4.4; Sodium 141   Lipid Panel    Component Value Date/Time   CHOL 135 12/25/2016 0957   TRIG 119 12/25/2016 0957   HDL 33 (L) 12/25/2016 0957   CHOLHDL 4.1 12/25/2016 0957   CHOLHDL 3.6 01/03/2016 0831   VLDL 34 (H) 01/03/2016 0831   LDLCALC 78 12/25/2016 0957    Additional studies/ records that were reviewed today include: Summarized above    ASSESSMENT & PLAN:   1. Nonischemic cardiomyopathy - secondary to chemotherapy She continues treatment with Taxol/ Avastin, most recent echocardiogram from October 2018 shows LVEF low normal 50-55% from previous 55-60%, global longitudinal strain is unchanged at 16%. She is okay to continue the same chemotherapy, we'll repeat echo with strain in January 2019 followed by a visit with [place]. 2. Coronary calcification by CT - no recent anginal symptoms. Lipids controlled. Prior low risk nuc. Continue surveillance for symptoms. 3. Essential HTN - controlled. 4. Hyperlipidemia - on atorvastatin that is tolerated well. Ovarian cancer with metastasis currently on chemotherapy with Avastin and Taxol - management as above.   Disposition: F/u with Dr. Meda Coffee in 3 months with echo prior to the appointment.  Medication Adjustments/Labs and Tests Ordered: Current medicines are reviewed at length with the patient today.  Concerns regarding medicines are outlined above. Medication changes, Labs and Tests ordered today are summarized above and listed in the Patient Instructions accessible in Encounters.   Signed, Ena Dawley, MD  04/26/2017 9:45 AM    [address], [place], [place]  [address] Phone: [phone]; Fax: [phone]

## 2017-04-27 ENCOUNTER — Telehealth: Payer: Self-pay

## 2017-04-27 NOTE — Telephone Encounter (Signed)
Pt called that she saw her CA125 came down a little and was asking if Dr Benay Spice planned on changing anything. She then said it is OK to wait for her appt on 10/31 and that recheck of her CA125.

## 2017-05-03 ENCOUNTER — Telehealth: Payer: Self-pay | Admitting: *Deleted

## 2017-05-03 NOTE — Telephone Encounter (Signed)
Spoke with Maudie Mercury in pathology at [facility]. Requested [facility] testing on [id number]. Pt notified. She voiced appreciation for call.

## 2017-05-03 NOTE — Telephone Encounter (Addendum)
Returned call to pt, informed her Dr. Benay Spice plans to continue Taxol for now. She asked if molecular testing can be sent off to see if there are other treatment options.  Will review with MD. Pt reported she had loose stools after this treatment but did not have the pain or nausea.

## 2017-05-05 ENCOUNTER — Other Ambulatory Visit: Payer: Self-pay | Admitting: Oncology

## 2017-05-08 ENCOUNTER — Ambulatory Visit: Payer: Medicare Other | Admitting: Emergency Medicine

## 2017-05-09 ENCOUNTER — Ambulatory Visit (HOSPITAL_BASED_OUTPATIENT_CLINIC_OR_DEPARTMENT_OTHER): Payer: Medicare Other

## 2017-05-09 ENCOUNTER — Ambulatory Visit (HOSPITAL_BASED_OUTPATIENT_CLINIC_OR_DEPARTMENT_OTHER): Payer: Medicare Other | Admitting: Oncology

## 2017-05-09 ENCOUNTER — Ambulatory Visit: Payer: Medicare Other

## 2017-05-09 ENCOUNTER — Telehealth: Payer: Self-pay

## 2017-05-09 ENCOUNTER — Other Ambulatory Visit (HOSPITAL_BASED_OUTPATIENT_CLINIC_OR_DEPARTMENT_OTHER): Payer: Medicare Other

## 2017-05-09 VITALS — BP 152/69 | HR 58 | Temp 97.9°F | Resp 20

## 2017-05-09 VITALS — BP 136/57 | HR 69 | Temp 97.6°F | Resp 18 | Ht 61.0 in | Wt 196.1 lb

## 2017-05-09 DIAGNOSIS — Z5111 Encounter for antineoplastic chemotherapy: Secondary | ICD-10-CM

## 2017-05-09 DIAGNOSIS — C569 Malignant neoplasm of unspecified ovary: Secondary | ICD-10-CM

## 2017-05-09 DIAGNOSIS — C5701 Malignant neoplasm of right fallopian tube: Secondary | ICD-10-CM | POA: Diagnosis not present

## 2017-05-09 DIAGNOSIS — J910 Malignant pleural effusion: Secondary | ICD-10-CM | POA: Diagnosis not present

## 2017-05-09 DIAGNOSIS — C7962 Secondary malignant neoplasm of left ovary: Secondary | ICD-10-CM | POA: Diagnosis not present

## 2017-05-09 DIAGNOSIS — C7961 Secondary malignant neoplasm of right ovary: Secondary | ICD-10-CM

## 2017-05-09 DIAGNOSIS — C799 Secondary malignant neoplasm of unspecified site: Secondary | ICD-10-CM

## 2017-05-09 DIAGNOSIS — Z5112 Encounter for antineoplastic immunotherapy: Secondary | ICD-10-CM | POA: Diagnosis not present

## 2017-05-09 DIAGNOSIS — C482 Malignant neoplasm of peritoneum, unspecified: Secondary | ICD-10-CM

## 2017-05-09 LAB — COMPREHENSIVE METABOLIC PANEL
ALT: 18 U/L (ref 0–55)
ANION GAP: 9 meq/L (ref 3–11)
AST: 15 U/L (ref 5–34)
Albumin: 3.3 g/dL — ABNORMAL LOW (ref 3.5–5.0)
Alkaline Phosphatase: 78 U/L (ref 40–150)
BILIRUBIN TOTAL: 0.4 mg/dL (ref 0.20–1.20)
BUN: 25.7 mg/dL (ref 7.0–26.0)
CALCIUM: 9.4 mg/dL (ref 8.4–10.4)
CHLORIDE: 103 meq/L (ref 98–109)
CO2: 29 meq/L (ref 22–29)
CREATININE: 1 mg/dL (ref 0.6–1.1)
EGFR: 57 mL/min/{1.73_m2} — ABNORMAL LOW (ref >60)
Glucose: 113 mg/dl (ref 70–140)
Potassium: 4.3 mEq/L (ref 3.5–5.1)
Sodium: 140 mEq/L (ref 136–145)
TOTAL PROTEIN: 6.6 g/dL (ref 6.4–8.3)

## 2017-05-09 LAB — CBC WITH DIFFERENTIAL/PLATELET
BASO%: 1 % (ref 0.0–2.0)
Basophils Absolute: 0 10*3/uL (ref 0.0–0.1)
EOS%: 0.2 % (ref 0.0–7.0)
Eosinophils Absolute: 0 10*3/uL (ref 0.0–0.5)
HEMATOCRIT: 35.2 % (ref 34.8–46.6)
HEMOGLOBIN: 11.4 g/dL — AB (ref 11.6–15.9)
LYMPH#: 1 10*3/uL (ref 0.9–3.3)
LYMPH%: 30.9 % (ref 14.0–49.7)
MCH: 29.1 pg (ref 25.1–34.0)
MCHC: 32.4 g/dL (ref 31.5–36.0)
MCV: 90 fL (ref 79.5–101.0)
MONO#: 0.4 10*3/uL (ref 0.1–0.9)
MONO%: 13.8 % (ref 0.0–14.0)
NEUT#: 1.7 10*3/uL (ref 1.5–6.5)
NEUT%: 54.1 % (ref 38.4–76.8)
PLATELETS: 218 10*3/uL (ref 145–400)
RBC: 3.92 10*6/uL (ref 3.70–5.45)
RDW: 20.1 % — AB (ref 11.2–14.5)
WBC: 3.1 10*3/uL — AB (ref 3.9–10.3)

## 2017-05-09 LAB — UA PROTEIN, DIPSTICK - CHCC: PROTEIN: NEGATIVE mg/dL

## 2017-05-09 MED ORDER — SODIUM CHLORIDE 0.9 % IV SOLN
80.0000 mg/m2 | Freq: Once | INTRAVENOUS | Status: AC
  Administered 2017-05-09: 150 mg via INTRAVENOUS
  Filled 2017-05-09: qty 25

## 2017-05-09 MED ORDER — SODIUM CHLORIDE 0.9% FLUSH
10.0000 mL | INTRAVENOUS | Status: DC | PRN
  Administered 2017-05-09: 10 mL
  Filled 2017-05-09: qty 10

## 2017-05-09 MED ORDER — HEPARIN SOD (PORK) LOCK FLUSH 100 UNIT/ML IV SOLN
500.0000 [IU] | Freq: Once | INTRAVENOUS | Status: AC | PRN
Start: 2017-05-09 — End: 2017-05-09
  Administered 2017-05-09: 500 [IU]
  Filled 2017-05-09: qty 5

## 2017-05-09 MED ORDER — DIPHENHYDRAMINE HCL 25 MG PO TABS
25.0000 mg | ORAL_TABLET | Freq: Once | ORAL | Status: AC
  Administered 2017-05-09: 25 mg via ORAL
  Filled 2017-05-09: qty 1

## 2017-05-09 MED ORDER — DEXAMETHASONE SODIUM PHOSPHATE 10 MG/ML IJ SOLN
5.0000 mg | Freq: Once | INTRAMUSCULAR | Status: AC
  Administered 2017-05-09: 5 mg via INTRAVENOUS

## 2017-05-09 MED ORDER — SODIUM CHLORIDE 0.9 % IV SOLN
10.5000 mg/kg | Freq: Once | INTRAVENOUS | Status: AC
  Administered 2017-05-09: 900 mg via INTRAVENOUS
  Filled 2017-05-09: qty 36

## 2017-05-09 MED ORDER — FAMOTIDINE IN NACL 20-0.9 MG/50ML-% IV SOLN
INTRAVENOUS | Status: AC
  Filled 2017-05-09: qty 50

## 2017-05-09 MED ORDER — DEXAMETHASONE SODIUM PHOSPHATE 10 MG/ML IJ SOLN
INTRAMUSCULAR | Status: AC
  Filled 2017-05-09: qty 1

## 2017-05-09 MED ORDER — DIPHENHYDRAMINE HCL 25 MG PO CAPS
ORAL_CAPSULE | ORAL | Status: AC
  Filled 2017-05-09: qty 1

## 2017-05-09 MED ORDER — SODIUM CHLORIDE 0.9 % IV SOLN
Freq: Once | INTRAVENOUS | Status: AC
  Administered 2017-05-09: 12:00:00 via INTRAVENOUS

## 2017-05-09 MED ORDER — FAMOTIDINE IN NACL 20-0.9 MG/50ML-% IV SOLN
20.0000 mg | Freq: Once | INTRAVENOUS | Status: AC
  Administered 2017-05-09: 20 mg via INTRAVENOUS

## 2017-05-09 NOTE — Patient Instructions (Signed)
Implanted Port Home Guide An implanted port is a type of central line that is placed under the skin. Central lines are used to provide IV access when treatment or nutrition needs to be given through a person's veins. Implanted ports are used for long-term IV access. An implanted port may be placed because:  You need IV medicine that would be irritating to the small veins in your hands or arms.  You need long-term IV medicines, such as antibiotics.  You need IV nutrition for a long period.  You need frequent blood draws for lab tests.  You need dialysis.  Implanted ports are usually placed in the chest area, but they can also be placed in the upper arm, the abdomen, or the leg. An implanted port has two main parts:  Reservoir. The reservoir is round and will appear as a small, raised area under your skin. The reservoir is the part where a needle is inserted to give medicines or draw blood.  Catheter. The catheter is a thin, flexible tube that extends from the reservoir. The catheter is placed into a large vein. Medicine that is inserted into the reservoir goes into the catheter and then into the vein.  How will I care for my incision site? Do not get the incision site wet. Bathe or shower as directed by your health care provider. How is my port accessed? Special steps must be taken to access the port:  Before the port is accessed, a numbing cream can be placed on the skin. This helps numb the skin over the port site.  Your health care provider uses a sterile technique to access the port. ? Your health care provider must put on a mask and sterile gloves. ? The skin over your port is cleaned carefully with an antiseptic and allowed to dry. ? The port is gently pinched between sterile gloves, and a needle is inserted into the port.  Only "non-coring" port needles should be used to access the port. Once the port is accessed, a blood return should be checked. This helps ensure that the port  is in the vein and is not clogged.  If your port needs to remain accessed for a constant infusion, a clear (transparent) bandage will be placed over the needle site. The bandage and needle will need to be changed every week, or as directed by your health care provider.  Keep the bandage covering the needle clean and dry. Do not get it wet. Follow your health care provider's instructions on how to take a shower or bath while the port is accessed.  If your port does not need to stay accessed, no bandage is needed over the port.  What is flushing? Flushing helps keep the port from getting clogged. Follow your health care provider's instructions on how and when to flush the port. Ports are usually flushed with saline solution or a medicine called heparin. The need for flushing will depend on how the port is used.  If the port is used for intermittent medicines or blood draws, the port will need to be flushed: ? After medicines have been given. ? After blood has been drawn. ? As part of routine maintenance.  If a constant infusion is running, the port may not need to be flushed.  How long will my port stay implanted? The port can stay in for as long as your health care provider thinks it is needed. When it is time for the port to come out, surgery will be   done to remove it. The procedure is similar to the one performed when the port was put in. When should I seek immediate medical care? When you have an implanted port, you should seek immediate medical care if:  You notice a bad smell coming from the incision site.  You have swelling, redness, or drainage at the incision site.  You have more swelling or pain at the port site or the surrounding area.  You have a fever that is not controlled with medicine.  This information is not intended to replace advice given to you by your health care provider. Make sure you discuss any questions you have with your health care provider. Document  Released: 06/26/2005 Document Revised: 12/02/2015 Document Reviewed: 03/03/2013 Elsevier Interactive Patient Education  2017 Elsevier Inc.  

## 2017-05-09 NOTE — Patient Instructions (Signed)
[facility] Discharge Instructions for Patients Receiving Chemotherapy  Today you received the following chemotherapy agents Taxol, Avastin.   To help prevent nausea and vomiting after your treatment, we encourage you to take your nausea medication as prescribed.   If you develop nausea and vomiting that is not controlled by your nausea medication, call the clinic.   BELOW ARE SYMPTOMS THAT SHOULD BE REPORTED IMMEDIATELY:  *FEVER GREATER THAN 100.5 F  *CHILLS WITH OR WITHOUT FEVER  NAUSEA AND VOMITING THAT IS NOT CONTROLLED WITH YOUR NAUSEA MEDICATION  *UNUSUAL SHORTNESS OF BREATH  *UNUSUAL BRUISING OR BLEEDING  TENDERNESS IN MOUTH AND THROAT WITH OR WITHOUT PRESENCE OF ULCERS  *URINARY PROBLEMS  *BOWEL PROBLEMS  UNUSUAL RASH Items with * indicate a potential emergency and should be followed up as soon as possible.  Feel free to call the clinic should you have any questions or concerns. The clinic phone number is [phone].  Please show the [facility] at check-in to the Emergency Department and triage nurse.

## 2017-05-09 NOTE — Progress Notes (Signed)
[place] OFFICE PROGRESS NOTE   Diagnosis: Peritoneal carcinoma  INTERVAL HISTORY:   Lynn Ramirez returns as scheduled. She competed another treatment with Taxol/Avastin 04/25/2017. No change in neuropathy symptoms. She developed acute right pelvic discomfort last night. The pain is present at the right lower back and right groin. She denies trauma. The pain persists today.  Objective:  Vital signs in last 24 hours:  Blood pressure (!) 136/57, pulse 69, temperature 97.6 F (36.4 C), temperature source Oral, resp. rate 18, height 5\' 1"  (1.549 m), weight 196 lb 1.6 oz (89 kg), SpO2 96 %.    HEENT: No thrush or ulcers Resp: Lungs clear bilaterally with distant breath sounds, no respiratory distress Cardio: Regular rate and rhythm GI: No hepatomegaly, no mass, tender in the right groin Vascular: No leg edema Musculoskeletal: Tender at the right posterior iliac and at the superior aspect of the right groin. No mass. No rash. No pain with motion at the right hip Skin: No rash   Portacath/PICC-without erythema  Lab Results:  Lab Results  Component Value Date   WBC 3.1 (L) 05/09/2017   HGB 11.4 (L) 05/09/2017   HCT 35.2 05/09/2017   MCV 90.0 05/09/2017   PLT 218 05/09/2017   NEUTROABS 1.7 05/09/2017    CMP     Component Value Date/Time   NA 140 05/09/2017 0930   K 4.3 05/09/2017 0930   CL 105 10/18/2016 0336   CO2 29 05/09/2017 0930   GLUCOSE 113 05/09/2017 0930   BUN 25.7 05/09/2017 0930   CREATININE 1.0 05/09/2017 0930   CALCIUM 9.4 05/09/2017 0930   PROT 6.6 05/09/2017 0930   ALBUMIN 3.3 (L) 05/09/2017 0930   AST 15 05/09/2017 0930   ALT 18 05/09/2017 0930   ALKPHOS 78 05/09/2017 0930   BILITOT 0.40 05/09/2017 0930   GFRNONAA 52 (L) 10/18/2016 0336   GFRAA >60 10/18/2016 0336     Medications: I have reviewed the patient's current medications.  Assessment/Plan: 1. Malignant right pleural effusion-cytology revealed metastatic  adenocarcinoma with papillary features, immunohistochemical profile consistent with a GYN primary, elevated CA 125   Staging CTs of the chest, abdomen, and pelvis on 10/06/2014 revealed a loculated right pleural effusion, ascites, and omental/mesenteric thickening   Cytology from peritoneal fluid 10/13/2014 revealed malignant cells consistent with metastatic adenocarcinoma Biopsy of an omental mass on 11/02/2014 revealed invasive serous carcinoma with psammoma bodies Cycle 1 Taxol/carboplatin 10/28/2014 Cycle 2 Taxol/carboplatin 11/18/2014 Cycle 3 Taxol/carboplatin 12/09/2014  CT scan 12/23/2014 with interval improvement in peritoneal carcinomatosis with near-complete resolution of ascites and decreased omental nodularity. Significant improvement in malignant right pleural effusion.  Status post robotic-assisted laparoscopic hysterectomy with bilateral salpingoophorectomy, omentectomy, radical tumor debulking 12/29/2014. Per Dr. Serita Grit office note 01/14/2015 cytoreduction was optimal with residual disease remaining only in the 1 mm implants on the small intestine. 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 Cycle 1 adjuvant Taxol/carboplatin 01/20/2015 Cycle 2 adjuvant Taxol/carboplatin 02/10/2015 Cycle 3 adjuvant Taxol/carboplatin 03/10/2015 CA 125 on 1013 2016-42  CTs of the chest, abdomen, and pelvis 05/31/2015 with no evidence of progressive ovarian cancer  CTs of the chest, abdomen, and pelvis 08/07/2015 and 08/08/2015-no evidence of progressive ovarian cancer  Peritoneal studding noted  at the time of the cholecystectomy procedure 08/09/2015 Elevated CA 125 10/07/2015  CT 10/07/2015 with stricturing at the sigmoid colon, constipation, and omental nodularity  Initiation of salvage weekly Taxol chemotherapy 10/13/2015  Taxol changed to every 2 weeks beginning 01/19/2016 due to painful neuropathy.  CT scans 07/19/2016 with no acute findings. No features in the abdomen or pelvis to suggest recurrent disease.Stable mild fullness right intrarenal collecting system.  Elevated CA 125 treatment resumed with Taxol/Avastin on a 2 week schedule 09/27/2016  CT 02/12/2017-no evidence of carcinomatosis, no evidence of progressive metastatic disease  Taxol/Avastin continued every 2 weeks 2. COPD-followed by Dr. Lamonte Sakai. 3. Dyspnea secondary to COPD and the large right pleural effusion, status post therapeutic thoracentesis procedures 09/30/2014,10/09/2014, and 10/21/2014. Left thoracentesis 10/30/2014 4. Left nephrectomy as a child 5. Delayed nausea following cycle 1 Taxol/carboplatin, Aloxi/Emend added with cycle 2 with improvement. 6. Right lower extremity edema, right calf pain 12/09/2014. Negative venous Doppler 12/09/2014. 7. Diffuse pruritus following cycle 1 adjuvant Taxol/carboplatin 01/20/2015, no rash, resolved with a steroid dose pack 8. Thrombocytopenia second to chemotherapy, the carboplatin was dose reduced with cycle 2 adjuvant Taxol/carboplatin 02/10/2015 9. Chemotherapy-induced peripheral neuropathy-painful peripheral neuropathy involving the feet 01/19/2016.  10. Admission with acute cholecystitis 08/07/2015, status post a cholecystectomy 08/09/2015 11. Admission 10/08/2015 with abdominal pain/constipation, improved with bowel rest and laxatives 12. Mild right hydronephrosis noted on the CT 10/07/2015. Renal ultrasound 12/16/2015 with no hydronephrosis noted.No hydronephrosis on CT 07/19/2016.   Disposition:  Lynn Ramirez continues to tolerate the Taxol/Avastin  well. The CA-125 is stable. She will complete another treatment today and then return for the next treatment and an office visit 06/06/2017.  The etiology of the right groin tenderness/pain and right iliac pain is unclear. This is most likely related to a benign musculoskeletal condition. She will contact us if the pain does not improve over the next few days.    Donneta Romberg, MD  05/09/2017  11:09 AM

## 2017-05-09 NOTE — Telephone Encounter (Signed)
Printed avs and calender for upcoming appointment in November. Per 10/31 los 

## 2017-05-10 LAB — CA 125: CANCER ANTIGEN (CA) 125: 469 U/mL — AB (ref 0.0–38.1)

## 2017-05-17 DIAGNOSIS — D225 Melanocytic nevi of trunk: Secondary | ICD-10-CM | POA: Diagnosis not present

## 2017-05-17 DIAGNOSIS — L578 Other skin changes due to chronic exposure to nonionizing radiation: Secondary | ICD-10-CM | POA: Diagnosis not present

## 2017-05-17 DIAGNOSIS — D1801 Hemangioma of skin and subcutaneous tissue: Secondary | ICD-10-CM | POA: Diagnosis not present

## 2017-05-21 ENCOUNTER — Telehealth: Payer: Self-pay | Admitting: Emergency Medicine

## 2017-05-21 NOTE — Telephone Encounter (Signed)
Spoke with pt, she stated she was put on Nexium and she wants to know if she should stop taking Nexium,  Since literature advises to take Nexium for 4 months and then stop. Does she stay on Nexium? She states she heard it causes breathing problems after long term use. RB please advise.

## 2017-05-22 DIAGNOSIS — M170 Bilateral primary osteoarthritis of knee: Secondary | ICD-10-CM | POA: Diagnosis not present

## 2017-05-23 NOTE — Telephone Encounter (Signed)
ATC pt, no answer. Left message for pt to call back.  

## 2017-05-23 NOTE — Telephone Encounter (Signed)
Called pt and advised message from the provider. Pt understood and verbalized understanding. Nothing further is needed.    

## 2017-05-23 NOTE — Telephone Encounter (Signed)
Please let her know that the Nexium was ordered because esophageal reflux can contribute to cough and sometimes to worsening of breathing and COPD.  It is okay if she would like to try stopping the medication to see if she tolerates it.  If her cough worsens or if her breathing changes then she may need to consider restarting the Nexium.

## 2017-05-25 DIAGNOSIS — C561 Malignant neoplasm of right ovary: Secondary | ICD-10-CM | POA: Diagnosis not present

## 2017-05-25 DIAGNOSIS — C799 Secondary malignant neoplasm of unspecified site: Secondary | ICD-10-CM | POA: Diagnosis not present

## 2017-05-30 DIAGNOSIS — J069 Acute upper respiratory infection, unspecified: Secondary | ICD-10-CM | POA: Diagnosis not present

## 2017-06-03 ENCOUNTER — Other Ambulatory Visit: Payer: Self-pay | Admitting: Oncology

## 2017-06-06 ENCOUNTER — Ambulatory Visit (HOSPITAL_BASED_OUTPATIENT_CLINIC_OR_DEPARTMENT_OTHER): Payer: Medicare Other | Admitting: Oncology

## 2017-06-06 ENCOUNTER — Telehealth: Payer: Self-pay | Admitting: Nurse Practitioner

## 2017-06-06 ENCOUNTER — Ambulatory Visit (HOSPITAL_BASED_OUTPATIENT_CLINIC_OR_DEPARTMENT_OTHER): Payer: Medicare Other

## 2017-06-06 ENCOUNTER — Ambulatory Visit: Payer: Medicare Other

## 2017-06-06 ENCOUNTER — Other Ambulatory Visit (HOSPITAL_BASED_OUTPATIENT_CLINIC_OR_DEPARTMENT_OTHER): Payer: Medicare Other

## 2017-06-06 VITALS — BP 130/54

## 2017-06-06 VITALS — BP 139/60 | HR 88 | Temp 98.2°F | Resp 18 | Ht 61.0 in | Wt 195.6 lb

## 2017-06-06 DIAGNOSIS — C5701 Malignant neoplasm of right fallopian tube: Secondary | ICD-10-CM

## 2017-06-06 DIAGNOSIS — C482 Malignant neoplasm of peritoneum, unspecified: Secondary | ICD-10-CM | POA: Diagnosis not present

## 2017-06-06 DIAGNOSIS — Z5111 Encounter for antineoplastic chemotherapy: Secondary | ICD-10-CM | POA: Diagnosis not present

## 2017-06-06 DIAGNOSIS — Z5112 Encounter for antineoplastic immunotherapy: Secondary | ICD-10-CM

## 2017-06-06 DIAGNOSIS — C5700 Malignant neoplasm of unspecified fallopian tube: Secondary | ICD-10-CM

## 2017-06-06 DIAGNOSIS — C569 Malignant neoplasm of unspecified ovary: Secondary | ICD-10-CM

## 2017-06-06 DIAGNOSIS — C786 Secondary malignant neoplasm of retroperitoneum and peritoneum: Secondary | ICD-10-CM

## 2017-06-06 DIAGNOSIS — C799 Secondary malignant neoplasm of unspecified site: Secondary | ICD-10-CM | POA: Diagnosis not present

## 2017-06-06 DIAGNOSIS — Z95828 Presence of other vascular implants and grafts: Secondary | ICD-10-CM

## 2017-06-06 LAB — COMPREHENSIVE METABOLIC PANEL
ALT: 19 U/L (ref 0–55)
ANION GAP: 9 meq/L (ref 3–11)
AST: 14 U/L (ref 5–34)
Albumin: 3.2 g/dL — ABNORMAL LOW (ref 3.5–5.0)
Alkaline Phosphatase: 73 U/L (ref 40–150)
BILIRUBIN TOTAL: 0.51 mg/dL (ref 0.20–1.20)
BUN: 17.5 mg/dL (ref 7.0–26.0)
CALCIUM: 9.2 mg/dL (ref 8.4–10.4)
CHLORIDE: 102 meq/L (ref 98–109)
CO2: 29 mEq/L (ref 22–29)
CREATININE: 1 mg/dL (ref 0.6–1.1)
EGFR: 56 mL/min/{1.73_m2} — ABNORMAL LOW (ref >60)
Glucose: 107 mg/dl (ref 70–140)
Potassium: 4.1 mEq/L (ref 3.5–5.1)
Sodium: 140 mEq/L (ref 136–145)
TOTAL PROTEIN: 6.8 g/dL (ref 6.4–8.3)

## 2017-06-06 LAB — UA PROTEIN, DIPSTICK - CHCC: Protein, ur: <30 mg/dL

## 2017-06-06 LAB — CBC WITH DIFFERENTIAL/PLATELET
BASO%: 1.1 % (ref 0.0–2.0)
Basophils Absolute: 0.1 10*3/uL (ref 0.0–0.1)
EOS ABS: 0.2 10*3/uL (ref 0.0–0.5)
EOS%: 1.7 % (ref 0.0–7.0)
HEMATOCRIT: 37.5 % (ref 34.8–46.6)
HEMOGLOBIN: 12.1 g/dL (ref 11.6–15.9)
LYMPH%: 9.8 % — AB (ref 14.0–49.7)
MCH: 29.3 pg (ref 25.1–34.0)
MCHC: 32.3 g/dL (ref 31.5–36.0)
MCV: 90.8 fL (ref 79.5–101.0)
MONO#: 0.7 10*3/uL (ref 0.1–0.9)
MONO%: 6.4 % (ref 0.0–14.0)
NEUT%: 81 % — AB (ref 38.4–76.8)
NEUTROS ABS: 8.3 10*3/uL — AB (ref 1.5–6.5)
PLATELETS: 204 10*3/uL (ref 145–400)
RBC: 4.12 10*6/uL (ref 3.70–5.45)
RDW: 20.7 % — ABNORMAL HIGH (ref 11.2–14.5)
WBC: 10.2 10*3/uL (ref 3.9–10.3)
lymph#: 1 10*3/uL (ref 0.9–3.3)

## 2017-06-06 MED ORDER — SODIUM CHLORIDE 0.9 % IV SOLN
80.0000 mg/m2 | Freq: Once | INTRAVENOUS | Status: AC
  Administered 2017-06-06: 150 mg via INTRAVENOUS
  Filled 2017-06-06: qty 25

## 2017-06-06 MED ORDER — DEXAMETHASONE SODIUM PHOSPHATE 10 MG/ML IJ SOLN
INTRAMUSCULAR | Status: AC
  Filled 2017-06-06: qty 1

## 2017-06-06 MED ORDER — DIPHENHYDRAMINE HCL 25 MG PO CAPS
ORAL_CAPSULE | ORAL | Status: AC
  Filled 2017-06-06: qty 1

## 2017-06-06 MED ORDER — SODIUM CHLORIDE 0.9 % IV SOLN
Freq: Once | INTRAVENOUS | Status: AC
  Administered 2017-06-06: 12:00:00 via INTRAVENOUS

## 2017-06-06 MED ORDER — FAMOTIDINE IN NACL 20-0.9 MG/50ML-% IV SOLN
20.0000 mg | Freq: Once | INTRAVENOUS | Status: AC
  Administered 2017-06-06: 20 mg via INTRAVENOUS

## 2017-06-06 MED ORDER — HEPARIN SOD (PORK) LOCK FLUSH 100 UNIT/ML IV SOLN
500.0000 [IU] | Freq: Once | INTRAVENOUS | Status: AC | PRN
  Administered 2017-06-06: 500 [IU]
  Filled 2017-06-06: qty 5

## 2017-06-06 MED ORDER — SODIUM CHLORIDE 0.9% FLUSH
10.0000 mL | INTRAVENOUS | Status: DC | PRN
  Administered 2017-06-06: 10 mL
  Filled 2017-06-06: qty 10

## 2017-06-06 MED ORDER — FAMOTIDINE IN NACL 20-0.9 MG/50ML-% IV SOLN
INTRAVENOUS | Status: AC
  Filled 2017-06-06: qty 50

## 2017-06-06 MED ORDER — DEXAMETHASONE SODIUM PHOSPHATE 10 MG/ML IJ SOLN
5.0000 mg | Freq: Once | INTRAMUSCULAR | Status: AC
  Administered 2017-06-06: 5 mg via INTRAVENOUS

## 2017-06-06 MED ORDER — SODIUM CHLORIDE 0.9 % IJ SOLN
10.0000 mL | INTRAMUSCULAR | Status: DC | PRN
  Filled 2017-06-06: qty 10

## 2017-06-06 MED ORDER — SODIUM CHLORIDE 0.9 % IV SOLN
10.5000 mg/kg | Freq: Once | INTRAVENOUS | Status: AC
  Administered 2017-06-06: 900 mg via INTRAVENOUS
  Filled 2017-06-06: qty 4

## 2017-06-06 MED ORDER — DIPHENHYDRAMINE HCL 25 MG PO TABS
25.0000 mg | ORAL_TABLET | Freq: Once | ORAL | Status: AC
  Administered 2017-06-06: 25 mg via ORAL
  Filled 2017-06-06: qty 1

## 2017-06-06 NOTE — Patient Instructions (Signed)
[facility] Discharge Instructions for Patients Receiving Chemotherapy  Today you received the following chemotherapy agents paclitaxel (Taxol) and bevacizumab (Avastin).   To help prevent nausea and vomiting after your treatment, we encourage you to take your nausea medication as prescribed.   If you develop nausea and vomiting that is not controlled by your nausea medication, call the clinic.   BELOW ARE SYMPTOMS THAT SHOULD BE REPORTED IMMEDIATELY:  *FEVER GREATER THAN 100.5 F  *CHILLS WITH OR WITHOUT FEVER  NAUSEA AND VOMITING THAT IS NOT CONTROLLED WITH YOUR NAUSEA MEDICATION  *UNUSUAL SHORTNESS OF BREATH  *UNUSUAL BRUISING OR BLEEDING  TENDERNESS IN MOUTH AND THROAT WITH OR WITHOUT PRESENCE OF ULCERS  *URINARY PROBLEMS  *BOWEL PROBLEMS  UNUSUAL RASH Items with * indicate a potential emergency and should be followed up as soon as possible.  Feel free to call the clinic should you have any questions or concerns. The clinic phone number is [phone].  Please show the [facility] at check-in to the Emergency Department and triage nurse.

## 2017-06-06 NOTE — Patient Instructions (Signed)
Implanted Port Home Guide An implanted port is a type of central line that is placed under the skin. Central lines are used to provide IV access when treatment or nutrition needs to be given through a person's veins. Implanted ports are used for long-term IV access. An implanted port may be placed because:  You need IV medicine that would be irritating to the small veins in your hands or arms.  You need long-term IV medicines, such as antibiotics.  You need IV nutrition for a long period.  You need frequent blood draws for lab tests.  You need dialysis.  Implanted ports are usually placed in the chest area, but they can also be placed in the upper arm, the abdomen, or the leg. An implanted port has two main parts:  Reservoir. The reservoir is round and will appear as a small, raised area under your skin. The reservoir is the part where a needle is inserted to give medicines or draw blood.  Catheter. The catheter is a thin, flexible tube that extends from the reservoir. The catheter is placed into a large vein. Medicine that is inserted into the reservoir goes into the catheter and then into the vein.  How will I care for my incision site? Do not get the incision site wet. Bathe or shower as directed by your health care provider. How is my port accessed? Special steps must be taken to access the port:  Before the port is accessed, a numbing cream can be placed on the skin. This helps numb the skin over the port site.  Your health care provider uses a sterile technique to access the port. ? Your health care provider must put on a mask and sterile gloves. ? The skin over your port is cleaned carefully with an antiseptic and allowed to dry. ? The port is gently pinched between sterile gloves, and a needle is inserted into the port.  Only "non-coring" port needles should be used to access the port. Once the port is accessed, a blood return should be checked. This helps ensure that the port  is in the vein and is not clogged.  If your port needs to remain accessed for a constant infusion, a clear (transparent) bandage will be placed over the needle site. The bandage and needle will need to be changed every week, or as directed by your health care provider.  Keep the bandage covering the needle clean and dry. Do not get it wet. Follow your health care provider's instructions on how to take a shower or bath while the port is accessed.  If your port does not need to stay accessed, no bandage is needed over the port.  What is flushing? Flushing helps keep the port from getting clogged. Follow your health care provider's instructions on how and when to flush the port. Ports are usually flushed with saline solution or a medicine called heparin. The need for flushing will depend on how the port is used.  If the port is used for intermittent medicines or blood draws, the port will need to be flushed: ? After medicines have been given. ? After blood has been drawn. ? As part of routine maintenance.  If a constant infusion is running, the port may not need to be flushed.  How long will my port stay implanted? The port can stay in for as long as your health care provider thinks it is needed. When it is time for the port to come out, surgery will be   done to remove it. The procedure is similar to the one performed when the port was put in. When should I seek immediate medical care? When you have an implanted port, you should seek immediate medical care if:  You notice a bad smell coming from the incision site.  You have swelling, redness, or drainage at the incision site.  You have more swelling or pain at the port site or the surrounding area.  You have a fever that is not controlled with medicine.  This information is not intended to replace advice given to you by your health care provider. Make sure you discuss any questions you have with your health care provider. Document  Released: 06/26/2005 Document Revised: 12/02/2015 Document Reviewed: 03/03/2013 Elsevier Interactive Patient Education  2017 Elsevier Inc.  

## 2017-06-06 NOTE — Progress Notes (Signed)
[facility] OFFICE PROGRESS NOTE   Diagnosis: Ovarian cancer  INTERVAL HISTORY:   Ms. Kolinski returns for a scheduled visit.  She returned from a trip to [facility] last night.  She was last treated with Taxol/Avastin on 05/09/2017.  No change in neuropathy symptoms. She developed an upper respiratory infection while in [place].  She has sinus congestion and a cough.  No dyspnea or fever.  She was prescribed Keflex, but this caused diarrhea and she stopped it.  Objective:  Vital signs in last 24 hours:  Blood pressure 139/60, pulse 88, temperature 98.2 F (36.8 C), temperature source Oral, resp. rate 18, height 5\' 1"  (1.549 m), weight 195 lb 9.6 oz (88.7 kg), SpO2 97 %.    HEENT: The mouth is dry, pharynx without erythema or exudate Resp: Distant breath sounds, no respiratory distress Cardio: Regular rate and rhythm GI: Mild diffuse tenderness, no hepatomegaly, no apparent ascites, soft Vascular: No leg edema   Portacath/PICC-without erythema  Lab Results:  Lab Results  Component Value Date   WBC 10.2 06/06/2017   HGB 12.1 06/06/2017   HCT 37.5 06/06/2017   MCV 90.8 06/06/2017   PLT 204 06/06/2017   NEUTROABS 8.3 (H) 06/06/2017    CMP     Component Value Date/Time   NA 140 06/06/2017 0942   K 4.1 06/06/2017 0942   CL 105 10/18/2016 0336   CO2 29 06/06/2017 0942   GLUCOSE 107 06/06/2017 0942   BUN 17.5 06/06/2017 0942   CREATININE 1.0 06/06/2017 0942   CALCIUM 9.2 06/06/2017 0942   PROT 6.8 06/06/2017 0942   ALBUMIN 3.2 (L) 06/06/2017 0942   AST 14 06/06/2017 0942   ALT 19 06/06/2017 0942   ALKPHOS 73 06/06/2017 0942   BILITOT 0.51 06/06/2017 0942   GFRNONAA 52 (L) 10/18/2016 0336   GFRAA >60 10/18/2016 0336   CA 125 on 04/25/2017-497  Medications: I have reviewed the patient's current medications.  Assessment/Plan: 1. 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, immunohistochemical  profile consistent with a GYN primary, elevated CA 125   Staging CTs of the chest, abdomen, and pelvis on 10/06/2014 revealed a loculated right pleural effusion, ascites, and omental/mesenteric thickening   Cytology from peritoneal fluid 10/13/2014 revealed malignant cells consistent with metastatic adenocarcinoma Biopsy of an omental mass on 11/02/2014 revealed invasive serous carcinoma with psammoma bodies Cycle 1 Taxol/carboplatin 10/28/2014 Cycle 2 Taxol/carboplatin 11/18/2014 Cycle 3 Taxol/carboplatin 12/09/2014  CT scan 12/23/2014 with interval improvement in peritoneal carcinomatosis with near-complete resolution of ascites and decreased omental nodularity. Significant improvement in malignant right pleural effusion.  Status post robotic-assisted laparoscopic hysterectomy with bilateral salpingoophorectomy, omentectomy, radical tumor debulking 12/29/2014. Per Dr. Serita Grit office note 01/14/2015 cytoreduction was optimal with residual disease remaining only in the 1 mm implants on the small intestine. 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 Cycle 1 adjuvant Taxol/carboplatin 01/20/2015 Cycle 2 adjuvant Taxol/carboplatin 02/10/2015 Cycle 3 adjuvant Taxol/carboplatin 03/10/2015 CA 125 on 1013 2016-42  CTs of the chest, abdomen, and pelvis 05/31/2015 with no evidence of progressive ovarian cancer  CTs of the chest, abdomen, and pelvis 08/07/2015 and 08/08/2015-no evidence of progressive ovarian cancer  Peritoneal studding noted at the time of the cholecystectomy procedure  08/09/2015 Elevated CA 125 10/07/2015  CT 10/07/2015 with stricturing at  the sigmoid colon, constipation, and omental nodularity  Initiation of salvage weekly Taxol chemotherapy 10/13/2015  Taxol changed to every 2 weeks beginning 01/19/2016 due to painful neuropathy.  CT scans 07/19/2016 with no acute findings. No features in the abdomen or pelvis to suggest recurrent disease.Stable mild fullness right intrarenal collecting system.  Elevated CA 125 treatment resumed with Taxol/Avastin on a 2 week schedule 09/27/2016  CT 02/12/2017-no evidence of carcinomatosis, no evidence of progressive metastatic disease  Taxol/Avastin continued every 2 weeks 2. COPD-followed by Dr. Lamonte Sakai. 3. Dyspnea secondary to COPD and the large right pleural effusion, status post therapeutic thoracentesis procedures 09/30/2014,10/09/2014, and 10/21/2014. Left thoracentesis 10/30/2014 4. Left nephrectomy as a child 5. Delayed nausea following cycle 1 Taxol/carboplatin, Aloxi/Emend added with cycle 2 with improvement. 6. Right lower extremity edema, right calf pain 12/09/2014. Negative venous Doppler 12/09/2014. 7. Diffuse pruritus following cycle 1 adjuvant Taxol/carboplatin 01/20/2015, no rash, resolved with a steroid dose pack 8. Thrombocytopenia second to chemotherapy, the carboplatin was dose reduced with cycle 2 adjuvant Taxol/carboplatin 02/10/2015 9. Chemotherapy-induced peripheral neuropathy-painful peripheral neuropathy involving the feet 01/19/2016.  10. Admission with acute cholecystitis 08/07/2015, status post a cholecystectomy 08/09/2015 11. Admission 10/08/2015 with abdominal pain/constipation, improved with bowel rest and laxatives 12. Mild right hydronephrosis noted on the CT 10/07/2015. Renal ultrasound 12/16/2015 with no hydronephrosis noted.No hydronephrosis on CT 07/19/2016.   Disposition:  Mr. Hubka continues treatment with Taxol/Avastin.  There is no clinical evidence of disease  progression. She will complete another treatment today.  She has an upper respiratory infection, likely viral.  She will contact us for a fever or shortness of breath.  She will be scheduled for an office visit and chemotherapy in 2 weeks.  Foundation 1 testing does not reveal a targetable mutation.  25 minutes were spent with the patient today.  The majority of the time was used for counseling and coordination of care.  Betsy Coder, MD  06/06/2017  11:52 AM

## 2017-06-06 NOTE — Telephone Encounter (Signed)
Gave avs and calendar for December  °

## 2017-06-07 ENCOUNTER — Telehealth: Payer: Self-pay | Admitting: Emergency Medicine

## 2017-06-07 LAB — CA 125: CANCER ANTIGEN (CA) 125: 482.7 U/mL — AB (ref 0.0–38.1)

## 2017-06-07 NOTE — Telephone Encounter (Addendum)
Left pt VM regarding this note.  ----- Message from Ladell Pier, MD sent at 06/07/2017  7:22 AM EST ----- Please call patient, ca125 is stable

## 2017-06-08 ENCOUNTER — Other Ambulatory Visit: Payer: Self-pay | Admitting: Emergency Medicine

## 2017-06-08 DIAGNOSIS — C482 Malignant neoplasm of peritoneum, unspecified: Secondary | ICD-10-CM

## 2017-06-08 MED ORDER — GABAPENTIN 300 MG PO CAPS: 300.0000 mg | ORAL_CAPSULE | Freq: Every day | ORAL | 0 refills | Status: DC

## 2017-06-12 ENCOUNTER — Other Ambulatory Visit: Payer: Self-pay | Admitting: *Deleted

## 2017-06-12 DIAGNOSIS — C482 Malignant neoplasm of peritoneum, unspecified: Secondary | ICD-10-CM

## 2017-06-12 MED ORDER — GABAPENTIN 300 MG PO CAPS: 300.0000 mg | ORAL_CAPSULE | Freq: Every day | ORAL | 0 refills | Status: DC

## 2017-06-13 ENCOUNTER — Telehealth: Payer: Self-pay | Admitting: *Deleted

## 2017-06-13 ENCOUNTER — Ambulatory Visit (HOSPITAL_COMMUNITY)
Admission: RE | Admit: 2017-06-13 | Discharge: 2017-06-13 | Disposition: A | Discharge status: Home / Self Care | Payer: Medicare Other | Source: Ambulatory Visit | Attending: Nurse Practitioner | Admitting: Nurse Practitioner

## 2017-06-13 DIAGNOSIS — R110 Nausea: Secondary | ICD-10-CM | POA: Insufficient documentation

## 2017-06-13 DIAGNOSIS — C482 Malignant neoplasm of peritoneum, unspecified: Secondary | ICD-10-CM

## 2017-06-13 DIAGNOSIS — R6881 Early satiety: Secondary | ICD-10-CM | POA: Diagnosis not present

## 2017-06-13 DIAGNOSIS — K5900 Constipation, unspecified: Secondary | ICD-10-CM | POA: Insufficient documentation

## 2017-06-13 DIAGNOSIS — M545 Low back pain: Secondary | ICD-10-CM | POA: Insufficient documentation

## 2017-06-13 NOTE — Telephone Encounter (Signed)
Message from pt reporting early satiety and indigestion,eating about 1/4 of usual intake. She also reports low back pain and constipation.Last BM 12/1. Nauseated without emesis. Feels bloated.  Pt reports increased fatigue since 12/2. Reviewed with Ned Card, NP: Order received for abdominal Xray and office visit 12/6. Change to clear liquid diet. Called pt with above, she voiced understanding. Stated she will have Xray this evening.

## 2017-06-14 ENCOUNTER — Encounter: Payer: Self-pay | Admitting: Nurse Practitioner

## 2017-06-14 ENCOUNTER — Ambulatory Visit (HOSPITAL_BASED_OUTPATIENT_CLINIC_OR_DEPARTMENT_OTHER): Payer: Medicare Other | Admitting: Nurse Practitioner

## 2017-06-14 ENCOUNTER — Telehealth: Payer: Self-pay | Admitting: Nurse Practitioner

## 2017-06-14 VITALS — BP 135/53 | HR 91 | Temp 97.7°F | Resp 17 | Ht 61.0 in | Wt 190.7 lb

## 2017-06-14 DIAGNOSIS — K5900 Constipation, unspecified: Secondary | ICD-10-CM

## 2017-06-14 DIAGNOSIS — R110 Nausea: Secondary | ICD-10-CM | POA: Diagnosis not present

## 2017-06-14 DIAGNOSIS — C569 Malignant neoplasm of unspecified ovary: Secondary | ICD-10-CM

## 2017-06-14 DIAGNOSIS — C5701 Malignant neoplasm of right fallopian tube: Secondary | ICD-10-CM | POA: Diagnosis not present

## 2017-06-14 DIAGNOSIS — R109 Unspecified abdominal pain: Secondary | ICD-10-CM | POA: Diagnosis not present

## 2017-06-14 DIAGNOSIS — M549 Dorsalgia, unspecified: Secondary | ICD-10-CM

## 2017-06-14 MED ORDER — SORBITOL 70 % PO SOLN: ORAL | 0 refills | Status: DC

## 2017-06-14 NOTE — Telephone Encounter (Signed)
Per 12/6 - no los at check out

## 2017-06-14 NOTE — Progress Notes (Addendum)
[facility] OFFICE PROGRESS NOTE   Diagnosis:  Ovarian cancer  INTERVAL HISTORY:   Ms. Delmonico returns prior to scheduled follow-up for evaluation of abdominal/back pain and constipation.  Her last bowel movement was 06/09/2017.  She is taking Senokot and Colace.  She has MiraLAX but has not started this yet.  She notes a decreased appetite due to early satiety.  She has had intermittent nausea. No vomiting.  She has "heartburn" symptoms.  She takes Pepcid and Tums.  She feels her fluid intake is adequate.  Her husband disagrees.  Objective:  Vital signs in last 24 hours:  Blood pressure (!) 135/53, pulse 91, temperature 97.7 F (36.5 C), temperature source Oral, resp. rate 17, height 5\' 1"  (1.549 m), weight 190 lb 11.2 oz (86.5 kg), SpO2 99 %, peak flow (!) 2 L/min.    HEENT: Mouth appears dry.  No thrush or ulcers. Resp: Lungs clear bilaterally. Cardio: Regular rate and rhythm. GI: Abdomen is soft with mild diffuse tenderness.  Hypoactive bowel sounds.  No guarding or rigidity. Vascular: No leg edema.  Skin: Decreased skin turgor. Port-A-Cath without erythema.  Lab Results:   Lab Results  Component Value Date   WBC 10.2 06/06/2017   HGB 12.1 06/06/2017   HCT 37.5 06/06/2017   MCV 90.8 06/06/2017   PLT 204 06/06/2017   NEUTROABS 8.3 (H) 06/06/2017    Imaging:  Dg Abd 2 Views  Result Date: 06/14/2017 CLINICAL DATA:  Constipation for the past 5 days, early satiety, low back pain, nausea without vomiting, bloating sensation. EXAM: ABDOMEN - 2 VIEW COMPARISON:  Abdominal CT scan of February 12, 2017 and abdominal series of September 04, 2016. FINDINGS: The colonic stool burden is increased. There is no evidence of a fecal impaction. There is no small or large bowel obstructive pattern. There surgical clips in the gallbladder fossa. There are mild degenerative changes of the lower lumbar spine. The lung bases are clear. IMPRESSION: Increased colonic stool burden  compatible with constipation. No evidence of ileus or obstruction. Electronically Signed   By: David  Martinique M.D.   On: 06/14/2017 07:27    Medications: I have reviewed the patient's current medications.  Assessment/Plan: 1. 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 Significant improvement in malignant right pleural effusion.  Status post robotic-assisted laparoscopic hysterectomy with bilateral salpingoophorectomy, omentectomy, radical tumor debulking 12/29/2014. Per Dr. Serita Grit office note 01/14/2015 cytoreduction was optimal with residual disease remaining only in the 1 mm implants on the small intestine. 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 Cycle 1 adjuvant Taxol/carboplatin 01/20/2015 Cycle 2 adjuvant Taxol/carboplatin 02/10/2015 Cycle 3  adjuvant Taxol/carboplatin 03/10/2015 CA 125 on 1013 2016-42  CTs of the chest, abdomen, and pelvis 05/31/2015 with no evidence of  progressive ovarian cancer  CTs of the chest, abdomen, and pelvis 08/07/2015 and 08/08/2015-no evidence of progressive ovarian cancer  Peritoneal studding noted at the time of the cholecystectomy procedure 08/09/2015 Elevated CA 125 10/07/2015  CT 10/07/2015 with stricturing at the sigmoid colon, constipation, and omental nodularity  Initiation of salvage weekly Taxol chemotherapy 10/13/2015  Taxol changed to every 2 weeks beginning 01/19/2016 due to painful neuropathy.  CT scans 07/19/2016 with no acute findings. No features in the abdomen or pelvis to suggest recurrent disease.Stable mild fullness right intrarenal collecting system.  Elevated CA 125 treatment resumed with Taxol/Avastin on a 2 week schedule 09/27/2016  CT 02/12/2017-no evidence of carcinomatosis, no evidence of progressive metastatic disease  Taxol/Avastin continued every 2 weeks 2. COPD-followed by Dr. Lamonte Sakai. 3. Dyspnea secondary to COPD and the large right pleural effusion, status post therapeutic thoracentesis procedures 09/30/2014,10/09/2014, and 10/21/2014. Left thoracentesis 10/30/2014 4. Left nephrectomy as a child 5. Delayed nausea following cycle 1 Taxol/carboplatin, Aloxi/Emend added with cycle 2 with improvement. 6. Right lower extremity edema, right calf pain 12/09/2014. Negative venous Doppler 12/09/2014. 7. Diffuse pruritus following cycle 1 adjuvant Taxol/carboplatin 01/20/2015, no rash, resolved with a steroid dose pack 8. Thrombocytopenia second to chemotherapy, the carboplatin was dose reduced with cycle 2 adjuvant Taxol/carboplatin 02/10/2015 9. Chemotherapy-induced peripheral neuropathy-painful peripheral neuropathy involving the feet 01/19/2016.  10. Admission with acute cholecystitis 08/07/2015, status post a cholecystectomy 08/09/2015 11. Admission 10/08/2015 with  abdominal pain/constipation, improved with bowel rest and laxatives 12. Mild right hydronephrosis noted on the CT 10/07/2015. Renal ultrasound 12/16/2015 with no hydronephrosis noted.No hydronephrosis on CT 07/19/2016.    Disposition: Ms. Rundquist is a 76 year old woman with ovarian cancer on active treatment with Taxol/Avastin.  Last cycle was given 06/06/2017.  She presents today with back and abdominal pain, constipation and intermittent nausea.  Abdominal x-ray shows constipation with no evidence of ileus or obstruction.  She will continue Senokot and Colace and begin MiraLAX.  If this does not relieve the constipation she will begin sorbitol.  She will increase oral fluid intake.  She will keep her scheduled appointment 06/20/2017 but understands to contact the office prior to that if current symptoms worsen or she develops any new symptoms.  Patient seen with Dr. Benay Spice.  25 minutes were spent face-to-face at today's visit with the majority of that time involved in counseling/coordination of care.    Ned Card ANP/GNP-BC   06/14/2017  8:40 AM This was a shared visit with Ned Card.  Ms. Shilling was interviewed and examined.  She will begin a bowel regimen for constipation.  She will contact us for persistent constipation.  Julieanne Manson, MD

## 2017-06-17 ENCOUNTER — Other Ambulatory Visit: Payer: Self-pay | Admitting: Oncology

## 2017-06-20 ENCOUNTER — Ambulatory Visit (HOSPITAL_BASED_OUTPATIENT_CLINIC_OR_DEPARTMENT_OTHER): Payer: Medicare Other | Admitting: Nurse Practitioner

## 2017-06-20 ENCOUNTER — Encounter: Payer: Self-pay | Admitting: Nurse Practitioner

## 2017-06-20 ENCOUNTER — Other Ambulatory Visit (HOSPITAL_BASED_OUTPATIENT_CLINIC_OR_DEPARTMENT_OTHER): Payer: Medicare Other

## 2017-06-20 ENCOUNTER — Ambulatory Visit: Payer: Medicare Other

## 2017-06-20 ENCOUNTER — Telehealth: Payer: Self-pay | Admitting: Nurse Practitioner

## 2017-06-20 ENCOUNTER — Ambulatory Visit (HOSPITAL_BASED_OUTPATIENT_CLINIC_OR_DEPARTMENT_OTHER): Payer: Medicare Other

## 2017-06-20 VITALS — BP 141/64 | HR 85 | Temp 97.4°F | Resp 24 | Ht 61.0 in | Wt 192.6 lb

## 2017-06-20 VITALS — BP 129/60 | HR 72

## 2017-06-20 DIAGNOSIS — C569 Malignant neoplasm of unspecified ovary: Secondary | ICD-10-CM

## 2017-06-20 DIAGNOSIS — C786 Secondary malignant neoplasm of retroperitoneum and peritoneum: Secondary | ICD-10-CM

## 2017-06-20 DIAGNOSIS — C5701 Malignant neoplasm of right fallopian tube: Secondary | ICD-10-CM | POA: Diagnosis not present

## 2017-06-20 DIAGNOSIS — J910 Malignant pleural effusion: Secondary | ICD-10-CM

## 2017-06-20 DIAGNOSIS — Z5112 Encounter for antineoplastic immunotherapy: Secondary | ICD-10-CM

## 2017-06-20 DIAGNOSIS — C482 Malignant neoplasm of peritoneum, unspecified: Secondary | ICD-10-CM

## 2017-06-20 DIAGNOSIS — C5700 Malignant neoplasm of unspecified fallopian tube: Secondary | ICD-10-CM

## 2017-06-20 DIAGNOSIS — Z5111 Encounter for antineoplastic chemotherapy: Secondary | ICD-10-CM | POA: Diagnosis not present

## 2017-06-20 DIAGNOSIS — N390 Urinary tract infection, site not specified: Secondary | ICD-10-CM | POA: Diagnosis not present

## 2017-06-20 LAB — CBC WITH DIFFERENTIAL/PLATELET
BASO%: 0.5 % (ref 0.0–2.0)
Basophils Absolute: 0 10*3/uL (ref 0.0–0.1)
EOS%: 4.2 % (ref 0.0–7.0)
Eosinophils Absolute: 0.2 10*3/uL (ref 0.0–0.5)
HEMATOCRIT: 38.3 % (ref 34.8–46.6)
HGB: 11.6 g/dL (ref 11.6–15.9)
LYMPH#: 1.1 10*3/uL (ref 0.9–3.3)
LYMPH%: 28.5 % (ref 14.0–49.7)
MCH: 29.2 pg (ref 25.1–34.0)
MCHC: 30.3 g/dL — AB (ref 31.5–36.0)
MCV: 96.5 fL (ref 79.5–101.0)
MONO#: 0.6 10*3/uL (ref 0.1–0.9)
MONO%: 16.2 % — ABNORMAL HIGH (ref 0.0–14.0)
NEUT%: 50.6 % (ref 38.4–76.8)
NEUTROS ABS: 1.9 10*3/uL (ref 1.5–6.5)
Platelets: 298 10*3/uL (ref 145–400)
RBC: 3.97 10*6/uL (ref 3.70–5.45)
RDW: 19.1 % — ABNORMAL HIGH (ref 11.2–14.5)
WBC: 3.8 10*3/uL — AB (ref 3.9–10.3)

## 2017-06-20 LAB — COMPREHENSIVE METABOLIC PANEL
ALT: 18 U/L (ref 0–55)
AST: 18 U/L (ref 5–34)
Albumin: 3.4 g/dL — ABNORMAL LOW (ref 3.5–5.0)
Alkaline Phosphatase: 62 U/L (ref 40–150)
Anion Gap: 11 mEq/L (ref 3–11)
BUN: 16.7 mg/dL (ref 7.0–26.0)
CALCIUM: 9.6 mg/dL (ref 8.4–10.4)
CHLORIDE: 100 meq/L (ref 98–109)
CO2: 29 meq/L (ref 22–29)
CREATININE: 1 mg/dL (ref 0.6–1.1)
EGFR: 56 mL/min/{1.73_m2} — ABNORMAL LOW (ref >60)
Glucose: 113 mg/dl (ref 70–140)
Potassium: 4.1 mEq/L (ref 3.5–5.1)
Sodium: 139 mEq/L (ref 136–145)
Total Bilirubin: 0.46 mg/dL (ref 0.20–1.20)
Total Protein: 6.8 g/dL (ref 6.4–8.3)

## 2017-06-20 MED ORDER — SODIUM CHLORIDE 0.9% FLUSH
10.0000 mL | INTRAVENOUS | Status: DC | PRN
  Administered 2017-06-20: 10 mL
  Filled 2017-06-20: qty 10

## 2017-06-20 MED ORDER — SODIUM CHLORIDE 0.9 % IV SOLN
80.0000 mg/m2 | Freq: Once | INTRAVENOUS | Status: AC
  Administered 2017-06-20: 150 mg via INTRAVENOUS
  Filled 2017-06-20: qty 25

## 2017-06-20 MED ORDER — DIPHENHYDRAMINE HCL 25 MG PO TABS
25.0000 mg | ORAL_TABLET | Freq: Once | ORAL | Status: AC
  Administered 2017-06-20: 25 mg via ORAL
  Filled 2017-06-20: qty 1

## 2017-06-20 MED ORDER — FAMOTIDINE IN NACL 20-0.9 MG/50ML-% IV SOLN
20.0000 mg | Freq: Once | INTRAVENOUS | Status: AC
  Administered 2017-06-20: 20 mg via INTRAVENOUS

## 2017-06-20 MED ORDER — DEXAMETHASONE SODIUM PHOSPHATE 10 MG/ML IJ SOLN
5.0000 mg | Freq: Once | INTRAMUSCULAR | Status: AC
  Administered 2017-06-20: 5 mg via INTRAVENOUS

## 2017-06-20 MED ORDER — DEXAMETHASONE SODIUM PHOSPHATE 10 MG/ML IJ SOLN
INTRAMUSCULAR | Status: AC
  Filled 2017-06-20: qty 1

## 2017-06-20 MED ORDER — FAMOTIDINE IN NACL 20-0.9 MG/50ML-% IV SOLN
INTRAVENOUS | Status: AC
  Filled 2017-06-20: qty 50

## 2017-06-20 MED ORDER — SODIUM CHLORIDE 0.9 % IV SOLN
Freq: Once | INTRAVENOUS | Status: AC
  Administered 2017-06-20: 12:00:00 via INTRAVENOUS

## 2017-06-20 MED ORDER — DIPHENHYDRAMINE HCL 25 MG PO CAPS
ORAL_CAPSULE | ORAL | Status: AC
  Filled 2017-06-20: qty 1

## 2017-06-20 MED ORDER — BEVACIZUMAB CHEMO INJECTION 400 MG/16ML
10.5000 mg/kg | Freq: Once | INTRAVENOUS | Status: AC
  Administered 2017-06-20: 900 mg via INTRAVENOUS
  Filled 2017-06-20: qty 32

## 2017-06-20 MED ORDER — HEPARIN SOD (PORK) LOCK FLUSH 100 UNIT/ML IV SOLN
500.0000 [IU] | Freq: Once | INTRAVENOUS | Status: AC | PRN
  Administered 2017-06-20: 500 [IU]
  Filled 2017-06-20: qty 5

## 2017-06-20 NOTE — Telephone Encounter (Signed)
Scheduled appt per 12/12 los - unable to schedule treatment for 12/26 due to dapped day - logged - will contact patient when scheduled,.

## 2017-06-20 NOTE — Progress Notes (Signed)
[place] OFFICE PROGRESS NOTE   Diagnosis: Ovarian cancer  INTERVAL HISTORY:   Lynn Ramirez returns as scheduled.  She completed another cycle of Taxol/Avastin 06/06/2017.  She was seen in an unscheduled visit 06/14/2017 with constipation.  She reports the constipation resolved following a dose of sorbitol.  Bowels are now moving regularly.  Overall she is feeling better.  No recent nausea.  No mouth sores.  Stable neuropathy symptoms.  She intermittently notes pain involving both great toes.  She denies any bleeding except occasional mild nose bleeding.  Objective:  Vital signs in last 24 hours:  Blood pressure (!) 141/64, pulse 85, temperature (!) 97.4 F (36.3 C), temperature source Oral, resp. rate (!) 24, height 5\' 1"  (1.549 m), weight 192 lb 9.6 oz (87.4 kg), SpO2 98 %.    HEENT: No thrush or ulcers. Resp: Lungs clear bilaterally. Cardio: Regular rate and rhythm. GI: Abdomen is soft with mild diffuse tenderness.  This is unchanged from prior exams.  No hepatomegaly.  No mass. Vascular: No leg edema. Port-A-Cath without erythema.  Lab Results:  Lab Results  Component Value Date   WBC 3.8 (L) 06/20/2017   HGB 11.6 06/20/2017   HCT 38.3 06/20/2017   MCV 96.5 06/20/2017   PLT 298 06/20/2017   NEUTROABS 1.9 06/20/2017    Imaging:  No results found.  Medications: I have reviewed the patient's current medications.  Assessment/Plan: 1. 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, immunohistochemical profile consistent with a GYN primary, elevated CA 125   Staging CTs of the chest, abdomen, and pelvis on 10/06/2014 revealed a loculated right pleural effusion, ascites, and omental/mesenteric thickening   Cytology from peritoneal fluid 10/13/2014 revealed malignant cells  consistent with metastatic adenocarcinoma Biopsy of an omental mass on 11/02/2014 revealed invasive serous carcinoma with psammoma bodies Cycle 1 Taxol/carboplatin 10/28/2014 Cycle 2 Taxol/carboplatin 11/18/2014 Cycle 3 Taxol/carboplatin 12/09/2014  CT scan 12/23/2014 with interval improvement in peritoneal carcinomatosis with near-complete resolution of ascites and decreased omental nodularity. Significant improvement in malignant right pleural effusion.  Status post robotic-assisted laparoscopic hysterectomy with bilateral salpingoophorectomy, omentectomy, radical tumor debulking 12/29/2014. Per Dr. Serita Grit office note 01/14/2015 cytoreduction was optimal with residual disease remaining only in the 1 mm implants on the small intestine. 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 Cycle 1 adjuvant Taxol/carboplatin 01/20/2015 Cycle 2 adjuvant Taxol/carboplatin 02/10/2015 Cycle 3 adjuvant Taxol/carboplatin 03/10/2015 CA 125 on 1013 2016-42  CTs of the chest, abdomen, and pelvis 05/31/2015 with no evidence of progressive ovarian cancer  CTs of the chest, abdomen, and pelvis 08/07/2015 and 08/08/2015-no evidence of progressive ovarian cancer  Peritoneal studding noted at the time of the cholecystectomy procedure 08/09/2015 Elevated CA 125 10/07/2015  CT 10/07/2015 with stricturing at the sigmoid colon, constipation, and omental nodularity  Initiation of salvage weekly Taxol chemotherapy 10/13/2015  Taxol changed to every 2 weeks beginning 01/19/2016 due to painful neuropathy.  CT scans 07/19/2016 with no acute findings. No features in the abdomen or pelvis to suggest recurrent disease.Stable mild fullness  right intrarenal collecting system.  Elevated CA 125 treatment resumed with Taxol/Avastin on a 2 week schedule 09/27/2016  CT 02/12/2017-no evidence of carcinomatosis, no evidence of progressive metastatic disease  Taxol/Avastin continued every 2 weeks 2. COPD-followed by Dr. Lamonte Sakai. 3.  Dyspnea secondary to COPD and the large right pleural effusion, status post therapeutic thoracentesis procedures 09/30/2014,10/09/2014, and 10/21/2014. Left thoracentesis 10/30/2014 4. Left nephrectomy as a child 5. Delayed nausea following cycle 1 Taxol/carboplatin, Aloxi/Emend added with cycle 2 with improvement. 6. Right lower extremity edema, right calf pain 12/09/2014. Negative venous Doppler 12/09/2014. 7. Diffuse pruritus following cycle 1 adjuvant Taxol/carboplatin 01/20/2015, no rash, resolved with a steroid dose pack 8. Thrombocytopenia second to chemotherapy, the carboplatin was dose reduced with cycle 2 adjuvant Taxol/carboplatin 02/10/2015 9. Chemotherapy-induced peripheral neuropathy-painful peripheral neuropathy involving the feet 01/19/2016.  10. Admission with acute cholecystitis 08/07/2015, status post a cholecystectomy 08/09/2015 11. Admission 10/08/2015 with abdominal pain/constipation, improved with bowel rest and laxatives 12. Mild right hydronephrosis noted on the CT 10/07/2015. Renal ultrasound 12/16/2015 with no hydronephrosis noted.No hydronephrosis on CT 07/19/2016.   Disposition: Lynn Ramirez appears unchanged.  Most recent CA 125 was stable.  There is no clinical evidence of disease progression.  The plan is to continue every 2-week Taxol/Avastin.  The constipation she was experiencing last week has resolved.  She will continue a laxative regimen.  She will return for a follow-up visit and Taxol/Avastin in 2 weeks.  She will contact the office in the interim with any problems.  Plan reviewed with Dr. Benay Spice.    Ned Card ANP/GNP-BC   06/20/2017  10:56  AM

## 2017-06-20 NOTE — Patient Instructions (Signed)
[facility] Discharge Instructions for Patients Receiving Chemotherapy  Today you received the following chemotherapy agents: Taxol and Avastin.   To help prevent nausea and vomiting after your treatment, we encourage you to take your nausea medication as prescribed.   If you develop nausea and vomiting that is not controlled by your nausea medication, call the clinic.   BELOW ARE SYMPTOMS THAT SHOULD BE REPORTED IMMEDIATELY:  *FEVER GREATER THAN 100.5 F  *CHILLS WITH OR WITHOUT FEVER  NAUSEA AND VOMITING THAT IS NOT CONTROLLED WITH YOUR NAUSEA MEDICATION  *UNUSUAL SHORTNESS OF BREATH  *UNUSUAL BRUISING OR BLEEDING  TENDERNESS IN MOUTH AND THROAT WITH OR WITHOUT PRESENCE OF ULCERS  *URINARY PROBLEMS  *BOWEL PROBLEMS  UNUSUAL RASH Items with * indicate a potential emergency and should be followed up as soon as possible.  Feel free to call the clinic should you have any questions or concerns. The clinic phone number is [phone].  Please show the [facility] at check-in to the Emergency Department and triage nurse.

## 2017-06-21 LAB — CA 125: Cancer Antigen (CA) 125: 430.7 U/mL — ABNORMAL HIGH (ref 0.0–38.1)

## 2017-06-22 ENCOUNTER — Telehealth: Payer: Self-pay | Admitting: Oncology

## 2017-06-22 NOTE — Telephone Encounter (Signed)
Scheduled appt per 12/12 los - patient aware of appt added for 12/26 date and time.

## 2017-07-01 ENCOUNTER — Other Ambulatory Visit: Payer: Self-pay | Admitting: Oncology

## 2017-07-02 ENCOUNTER — Other Ambulatory Visit: Payer: Self-pay | Admitting: *Deleted

## 2017-07-04 ENCOUNTER — Other Ambulatory Visit (HOSPITAL_BASED_OUTPATIENT_CLINIC_OR_DEPARTMENT_OTHER): Payer: Medicare Other

## 2017-07-04 ENCOUNTER — Ambulatory Visit (HOSPITAL_BASED_OUTPATIENT_CLINIC_OR_DEPARTMENT_OTHER): Payer: Medicare Other

## 2017-07-04 ENCOUNTER — Telehealth: Payer: Self-pay | Admitting: Oncology

## 2017-07-04 ENCOUNTER — Ambulatory Visit: Payer: Medicare Other

## 2017-07-04 ENCOUNTER — Other Ambulatory Visit: Payer: Medicare Other

## 2017-07-04 ENCOUNTER — Ambulatory Visit: Payer: Medicare Other | Admitting: Oncology

## 2017-07-04 ENCOUNTER — Ambulatory Visit (HOSPITAL_BASED_OUTPATIENT_CLINIC_OR_DEPARTMENT_OTHER): Payer: Medicare Other | Admitting: Oncology

## 2017-07-04 VITALS — BP 148/84 | HR 84 | Temp 97.8°F | Resp 18 | Ht 61.0 in | Wt 191.3 lb

## 2017-07-04 VITALS — BP 137/58 | HR 78

## 2017-07-04 DIAGNOSIS — Z9289 Personal history of other medical treatment: Secondary | ICD-10-CM | POA: Insufficient documentation

## 2017-07-04 DIAGNOSIS — J910 Malignant pleural effusion: Secondary | ICD-10-CM | POA: Diagnosis not present

## 2017-07-04 DIAGNOSIS — Z87448 Personal history of other diseases of urinary system: Secondary | ICD-10-CM | POA: Insufficient documentation

## 2017-07-04 DIAGNOSIS — C569 Malignant neoplasm of unspecified ovary: Secondary | ICD-10-CM

## 2017-07-04 DIAGNOSIS — C5701 Malignant neoplasm of right fallopian tube: Secondary | ICD-10-CM

## 2017-07-04 DIAGNOSIS — Z905 Acquired absence of kidney: Secondary | ICD-10-CM | POA: Insufficient documentation

## 2017-07-04 DIAGNOSIS — Z5111 Encounter for antineoplastic chemotherapy: Secondary | ICD-10-CM | POA: Diagnosis not present

## 2017-07-04 DIAGNOSIS — I2510 Atherosclerotic heart disease of native coronary artery without angina pectoris: Secondary | ICD-10-CM | POA: Insufficient documentation

## 2017-07-04 DIAGNOSIS — L309 Dermatitis, unspecified: Secondary | ICD-10-CM | POA: Insufficient documentation

## 2017-07-04 DIAGNOSIS — G479 Sleep disorder, unspecified: Secondary | ICD-10-CM | POA: Insufficient documentation

## 2017-07-04 DIAGNOSIS — Z5112 Encounter for antineoplastic immunotherapy: Secondary | ICD-10-CM

## 2017-07-04 DIAGNOSIS — C5700 Malignant neoplasm of unspecified fallopian tube: Secondary | ICD-10-CM

## 2017-07-04 DIAGNOSIS — C482 Malignant neoplasm of peritoneum, unspecified: Secondary | ICD-10-CM

## 2017-07-04 DIAGNOSIS — I10 Essential (primary) hypertension: Secondary | ICD-10-CM | POA: Insufficient documentation

## 2017-07-04 DIAGNOSIS — Z95828 Presence of other vascular implants and grafts: Secondary | ICD-10-CM

## 2017-07-04 DIAGNOSIS — N182 Chronic kidney disease, stage 2 (mild): Secondary | ICD-10-CM | POA: Insufficient documentation

## 2017-07-04 DIAGNOSIS — K219 Gastro-esophageal reflux disease without esophagitis: Secondary | ICD-10-CM | POA: Insufficient documentation

## 2017-07-04 LAB — COMPREHENSIVE METABOLIC PANEL
ALBUMIN: 3.4 g/dL — AB (ref 3.5–5.0)
ALK PHOS: 73 U/L (ref 40–150)
ALT: 10 U/L (ref 0–55)
ANION GAP: 8 meq/L (ref 3–11)
AST: 13 U/L (ref 5–34)
BILIRUBIN TOTAL: 0.49 mg/dL (ref 0.20–1.20)
BUN: 15.8 mg/dL (ref 7.0–26.0)
CALCIUM: 9.4 mg/dL (ref 8.4–10.4)
CO2: 30 mEq/L — ABNORMAL HIGH (ref 22–29)
CREATININE: 0.9 mg/dL (ref 0.6–1.1)
Chloride: 100 mEq/L (ref 98–109)
Glucose: 104 mg/dl (ref 70–140)
Potassium: 4.1 mEq/L (ref 3.5–5.1)
Sodium: 137 mEq/L (ref 136–145)
TOTAL PROTEIN: 7 g/dL (ref 6.4–8.3)

## 2017-07-04 LAB — CBC WITH DIFFERENTIAL/PLATELET
BASO%: 1 % (ref 0.0–2.0)
BASOS ABS: 0 10*3/uL (ref 0.0–0.1)
EOS%: 4.4 % (ref 0.0–7.0)
Eosinophils Absolute: 0.2 10*3/uL (ref 0.0–0.5)
HEMATOCRIT: 35.7 % (ref 34.8–46.6)
HEMOGLOBIN: 11.5 g/dL — AB (ref 11.6–15.9)
LYMPH#: 1.1 10*3/uL (ref 0.9–3.3)
LYMPH%: 23.9 % (ref 14.0–49.7)
MCH: 29.8 pg (ref 25.1–34.0)
MCHC: 32.3 g/dL (ref 31.5–36.0)
MCV: 92.2 fL (ref 79.5–101.0)
MONO#: 0.6 10*3/uL (ref 0.1–0.9)
MONO%: 14.3 % — AB (ref 0.0–14.0)
NEUT%: 56.4 % (ref 38.4–76.8)
NEUTROS ABS: 2.5 10*3/uL (ref 1.5–6.5)
Platelets: 298 10*3/uL (ref 145–400)
RBC: 3.88 10*6/uL (ref 3.70–5.45)
RDW: 19.8 % — AB (ref 11.2–14.5)
WBC: 4.4 10*3/uL (ref 3.9–10.3)

## 2017-07-04 LAB — UA PROTEIN, DIPSTICK - CHCC: Protein, ur: NEGATIVE mg/dL

## 2017-07-04 MED ORDER — SODIUM CHLORIDE 0.9 % IV SOLN
80.0000 mg/m2 | Freq: Once | INTRAVENOUS | Status: AC
  Administered 2017-07-04: 150 mg via INTRAVENOUS
  Filled 2017-07-04: qty 25

## 2017-07-04 MED ORDER — FAMOTIDINE IN NACL 20-0.9 MG/50ML-% IV SOLN
INTRAVENOUS | Status: AC
  Filled 2017-07-04: qty 50

## 2017-07-04 MED ORDER — DEXAMETHASONE SODIUM PHOSPHATE 10 MG/ML IJ SOLN
INTRAMUSCULAR | Status: AC
  Filled 2017-07-04: qty 1

## 2017-07-04 MED ORDER — SODIUM CHLORIDE 0.9 % IV SOLN
Freq: Once | INTRAVENOUS | Status: AC
  Administered 2017-07-04: 15:00:00 via INTRAVENOUS

## 2017-07-04 MED ORDER — SODIUM CHLORIDE 0.9 % IJ SOLN
10.0000 mL | INTRAMUSCULAR | Status: DC | PRN
  Administered 2017-07-04: 10 mL via INTRAVENOUS
  Filled 2017-07-04: qty 10

## 2017-07-04 MED ORDER — SODIUM CHLORIDE 0.9% FLUSH
10.0000 mL | INTRAVENOUS | Status: DC | PRN
  Administered 2017-07-04: 10 mL
  Filled 2017-07-04: qty 10

## 2017-07-04 MED ORDER — DEXAMETHASONE SODIUM PHOSPHATE 10 MG/ML IJ SOLN
5.0000 mg | Freq: Once | INTRAMUSCULAR | Status: AC
  Administered 2017-07-04: 5 mg via INTRAVENOUS

## 2017-07-04 MED ORDER — FAMOTIDINE IN NACL 20-0.9 MG/50ML-% IV SOLN
20.0000 mg | Freq: Once | INTRAVENOUS | Status: AC
  Administered 2017-07-04: 20 mg via INTRAVENOUS

## 2017-07-04 MED ORDER — DIPHENHYDRAMINE HCL 25 MG PO CAPS
ORAL_CAPSULE | ORAL | Status: AC
  Filled 2017-07-04: qty 1

## 2017-07-04 MED ORDER — DIPHENHYDRAMINE HCL 25 MG PO TABS
25.0000 mg | ORAL_TABLET | Freq: Once | ORAL | Status: AC
  Administered 2017-07-04: 25 mg via ORAL
  Filled 2017-07-04: qty 1

## 2017-07-04 MED ORDER — DIPHENHYDRAMINE HCL 50 MG/ML IJ SOLN
INTRAMUSCULAR | Status: AC
  Filled 2017-07-04: qty 1

## 2017-07-04 MED ORDER — SODIUM CHLORIDE 0.9 % IV SOLN
900.0000 mg | Freq: Once | INTRAVENOUS | Status: AC
  Administered 2017-07-04: 900 mg via INTRAVENOUS
  Filled 2017-07-04: qty 32

## 2017-07-04 MED ORDER — HEPARIN SOD (PORK) LOCK FLUSH 100 UNIT/ML IV SOLN
500.0000 [IU] | Freq: Once | INTRAVENOUS | Status: AC | PRN
  Administered 2017-07-04: 500 [IU]
  Filled 2017-07-04: qty 5

## 2017-07-04 NOTE — Patient Instructions (Signed)
[facility] Discharge Instructions for Patients Receiving Chemotherapy  Today you received the following chemotherapy agents :  Avastin, Taxol.  To help prevent nausea and vomiting after your treatment, we encourage you to take your nausea medication as prescribed.   If you develop nausea and vomiting that is not controlled by your nausea medication, call the clinic.   BELOW ARE SYMPTOMS THAT SHOULD BE REPORTED IMMEDIATELY:  *FEVER GREATER THAN 100.5 F  *CHILLS WITH OR WITHOUT FEVER  NAUSEA AND VOMITING THAT IS NOT CONTROLLED WITH YOUR NAUSEA MEDICATION  *UNUSUAL SHORTNESS OF BREATH  *UNUSUAL BRUISING OR BLEEDING  TENDERNESS IN MOUTH AND THROAT WITH OR WITHOUT PRESENCE OF ULCERS  *URINARY PROBLEMS  *BOWEL PROBLEMS  UNUSUAL RASH Items with * indicate a potential emergency and should be followed up as soon as possible.  Feel free to call the clinic should you have any questions or concerns. The clinic phone number is [phone].  Please show the [facility] at check-in to the Emergency Department and triage nurse.

## 2017-07-04 NOTE — Progress Notes (Signed)
[facility] OFFICE PROGRESS NOTE   Diagnosis: Fallopian tube carcinoma  INTERVAL HISTORY:   Lynn Ramirez returns as scheduled.  She completed another treatment with Taxol/Avastin 06/20/2017.  She continues to have a cough.  The cough has been present since she developed an upper respiratory infection while in [place] last month.  No fever or shortness of breath.  She is having bowel movements.  No bleeding.  Objective:  Vital signs in last 24 hours:  Blood pressure (!) 148/84, pulse 84, temperature 97.8 F (36.6 C), temperature source Oral, resp. rate 18, height 5\' 1"  (1.549 m), weight 191 lb 4.8 oz (86.8 kg), SpO2 97 %.    HEENT: No thrush or ulcers Resp: Lungs clear bilaterally, distant breath sounds Cardio: Regular rate and rhythm GI: No hepatomegaly, no mass, no apparent ascites Vascular: Trace lower pretibial edema bilaterally    Portacath/PICC-without erythema  Lab Results:  Lab Results  Component Value Date   WBC 4.4 07/04/2017   HGB 11.5 (L) 07/04/2017   HCT 35.7 07/04/2017   MCV 92.2 07/04/2017   PLT 298 07/04/2017   NEUTROABS 2.5 07/04/2017    CMP     Component Value Date/Time   NA 137 07/04/2017 1122   K 4.1 07/04/2017 1122   CL 105 10/18/2016 0336   CO2 30 (H) 07/04/2017 1122   GLUCOSE 104 07/04/2017 1122   BUN 15.8 07/04/2017 1122   CREATININE 0.9 07/04/2017 1122   CALCIUM 9.4 07/04/2017 1122   PROT 7.0 07/04/2017 1122   ALBUMIN 3.4 (L) 07/04/2017 1122   AST 13 07/04/2017 1122   ALT 10 07/04/2017 1122   ALKPHOS 73 07/04/2017 1122   BILITOT 0.49 07/04/2017 1122   GFRNONAA 52 (L) 10/18/2016 0336   GFRAA >60 10/18/2016 0336    CA 125 on 06/20/2017: 430.7  Medications: I have reviewed the patient's current medications.   Assessment/Plan: 1. 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 Significant improvement in malignant right pleural effusion.  Status post robotic-assisted laparoscopic hysterectomy with bilateral salpingoophorectomy, omentectomy, radical tumor debulking 12/29/2014. Per Dr. Serita Grit office note 01/14/2015 cytoreduction was optimal with residual disease remaining only in the 1 mm implants on the small intestine. 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 Cycle 1 adjuvant Taxol/carboplatin 01/20/2015 Cycle 2 adjuvant Taxol/carboplatin 02/10/2015 Cycle 3 adjuvant Taxol/carboplatin 03/10/2015 CA 125 on 1013 2016-42  CTs of the chest, abdomen, and pelvis 05/31/2015 with no evidence of progressive ovarian cancer  CTs of the chest, abdomen, and pelvis 08/07/2015 and 08/08/2015-no evidence of progressive ovarian cancer  Peritoneal studding noted at the time of the cholecystectomy procedure 08/09/2015 Elevated CA 125 10/07/2015  CT 10/07/2015 with  stricturing at the sigmoid colon, constipation, and omental nodularity  Initiation of salvage  weekly Taxol chemotherapy 10/13/2015  Taxol changed to every 2 weeks beginning 01/19/2016 due to painful neuropathy.  CT scans 07/19/2016 with no acute findings. No features in the abdomen or pelvis to suggest recurrent disease.Stable mild fullness right intrarenal collecting system.  Elevated CA 125 treatment resumed with Taxol/Avastin on a 2 week schedule 09/27/2016  CT 02/12/2017-no evidence of carcinomatosis, no evidence of progressive metastatic disease  Taxol/Avastin continued every 2 weeks 2. COPD-followed by Dr. Lamonte Sakai. 3. Dyspnea secondary to COPD and the large right pleural effusion, status post therapeutic thoracentesis procedures 09/30/2014,10/09/2014, and 10/21/2014. Left thoracentesis 10/30/2014 4. Left nephrectomy as a child 5. Delayed nausea following cycle 1 Taxol/carboplatin, Aloxi/Emend added with cycle 2 with improvement. 6. Right lower extremity edema, right calf pain 12/09/2014. Negative venous Doppler 12/09/2014. 7. Diffuse pruritus following cycle 1 adjuvant Taxol/carboplatin 01/20/2015, no rash, resolved with a steroid dose pack 8. Thrombocytopenia second to chemotherapy, the carboplatin was dose reduced with cycle 2 adjuvant Taxol/carboplatin 02/10/2015 9. Chemotherapy-induced peripheral neuropathy-painful peripheral neuropathy involving the feet 01/19/2016.  10. Admission with acute cholecystitis 08/07/2015, status post a cholecystectomy 08/09/2015 11. Admission 10/08/2015 with abdominal pain/constipation, improved with bowel rest and laxatives 12. Mild right hydronephrosis noted on the CT 10/07/2015. Renal ultrasound 12/16/2015 with no hydronephrosis noted.No hydronephrosis on CT 07/19/2016.  Disposition: Lynn Ramirez appears unchanged.  The plan is to continue every 2-week Taxol/Avastin.  The cough is likely related to a slowly resolving upper respiratory infection  and COPD.  She will return for an office visit and chemotherapy in 2 weeks.  We will follow-up on the CA 125 from today.  Betsy Coder, MD  07/04/2017  1:45 PM

## 2017-07-04 NOTE — Telephone Encounter (Signed)
Scheduled appt per 12/26 los - Patient to get an updated schedule in the treatment area.  

## 2017-07-05 ENCOUNTER — Telehealth: Payer: Self-pay | Admitting: Emergency Medicine

## 2017-07-05 LAB — CA 125: Cancer Antigen (CA) 125: 448.3 U/mL — ABNORMAL HIGH (ref 0.0–38.1)

## 2017-07-05 NOTE — Telephone Encounter (Addendum)
Pt verbalized understanding of this note.   ----- Message from [name], NP sent at 07/05/2017  9:00 AM EST ----- Please let her know CA 125 is stable.

## 2017-07-14 ENCOUNTER — Other Ambulatory Visit: Payer: Self-pay | Admitting: Oncology

## 2017-07-16 ENCOUNTER — Ambulatory Visit (INDEPENDENT_AMBULATORY_CARE_PROVIDER_SITE_OTHER): Payer: Medicare Other | Admitting: Emergency Medicine

## 2017-07-16 ENCOUNTER — Encounter: Payer: Self-pay | Admitting: Emergency Medicine

## 2017-07-16 DIAGNOSIS — M79605 Pain in left leg: Secondary | ICD-10-CM

## 2017-07-16 DIAGNOSIS — J449 Chronic obstructive pulmonary disease, unspecified: Secondary | ICD-10-CM

## 2017-07-16 DIAGNOSIS — R053 Chronic cough: Secondary | ICD-10-CM

## 2017-07-16 DIAGNOSIS — R05 Cough: Secondary | ICD-10-CM | POA: Diagnosis not present

## 2017-07-16 DIAGNOSIS — J9611 Chronic respiratory failure with hypoxia: Secondary | ICD-10-CM

## 2017-07-16 MED ORDER — ESOMEPRAZOLE MAGNESIUM 40 MG PO CPDR: 40.0000 mg | DELAYED_RELEASE_CAPSULE | Freq: Every day | ORAL | 5 refills | Status: DC

## 2017-07-16 NOTE — Progress Notes (Signed)
Subjective:    Patient ID: Lynn Ramirez, female    DOB: 10-Aug-1940, 77 y.o.   MRN: [id number]  HPI 77 yo woman, has been followed by Dr Gwenette Greet for COPD, pleural effusions in setting of chemotherapy for serous carcinoma possibly Fallopian. History is taken from the patient and from her husband. She is following with Dr Benay Spice, Dr Denman George. She has been tired, is having some neuropathy. Her breathing is stable. She is on spiriva and symbicort. I personally reviewed her pulmonary function testing from 05/06/2012. This showed severe obstructive lung disease without bronchodilator response.   ROV 07/16/17 --pleasant 77 year old woman with ovarian cancer, associated pleural effusions, significant COPD with associated upper airway irritation and chronic cough.  She has hypoxemic respiratory failure and is on 2 L/min.  She has had a couple of URI's since last time, and her cough has persisted after. She is currently having hoarse voice, is coughing most days, minimally productive. She is on [employer] and Arnuity. She is off nexium, on pepcid. She is on loratadine. She noticed some intermittent L thigh pain - fell recently. She also was on a car ride from [place]. No calf pain.    Review of Systems  As per HPI     Objective:   Physical Exam Vitals:   07/16/17 1033  BP: 122/66  Pulse: 80  SpO2: 97%  Weight: 188 lb 6.4 oz (85.5 kg)  Height: 5\' 1"  (1.549 m)   Gen: Pleasant, well-nourished, in no distress,  normal affect  ENT: No lesions,  mouth clear,  oropharynx clear, no postnasal drip, hoarse voice  Neck: No JVD, no stridor,   Lungs: No use of accessory muscles, no wheezing  Cardiovascular: RRR, heart sounds normal, no murmur or gallops, no peripheral edema  Musculoskeletal: No deformities, no cyanosis or clubbing  Neuro: alert, non focal  Skin: Warm, no lesions or rashes      Assessment & Plan:    COPD  GOLD C Stop your omega-3 fatty acid pill for a month. Discuss with Dr Meda Coffee whether to go back on this. It may help your reflux and cough to take a break from it.  Restart nexium 40mg  daily for now. Stop pepcid Continue your Stiolto and Arnuity.  Keep albuterol available to use 2 puffs as needed for shortness of breath.  Continue loratadine 10mg  daily.  We will perform your oxygen requalification in March 2019 Follow with Dr Lamonte Sakai in 3 months or sooner if you have any problems.  Chronic respiratory failure with hypoxia (HCC) Due for oxygen requalification in March 2019.  Continue oxygen at 2 L/min  Allergic rhinitis Continue loratadine  GERD (gastroesophageal reflux disease) Stop your omega-3 fatty acid pill for a month. Discuss with Dr Meda Coffee whether to go back on this. It may help your reflux and cough to take a break from it.  Restart nexium 40mg  daily for now. Stop pepcid  Chronic cough Aggressive GERD and rhinitis therapy.  She uses Tessalon as needed  Left  leg pain This is in her left thigh it appears to be musculoskeletal.  I do not feel any cords, calf tenderness, calf swelling.  Discussed with her that she is at some risk for DVT.  If the pain continues then we will likely workup further.    Baltazar Apo, MD, PhD 07/16/2017, 11:02 AM [facility] [phone] or if no answer [phone]

## 2017-07-16 NOTE — Assessment & Plan Note (Signed)
Due for oxygen requalification in March 2019.  Continue oxygen at 2 L/min

## 2017-07-16 NOTE — Assessment & Plan Note (Signed)
Stop your omega-3 fatty acid pill for a month. Discuss with Dr Meda Coffee whether to go back on this. It may help your reflux and cough to take a break from it.  Restart nexium 40mg  daily for now. Stop pepcid Continue your Stiolto and Arnuity.  Keep albuterol available to use 2 puffs as needed for shortness of breath.  Continue loratadine 10mg  daily.  We will perform your oxygen requalification in March 2019 Follow with Dr Lamonte Sakai in 3 months or sooner if you have any problems.

## 2017-07-16 NOTE — Assessment & Plan Note (Signed)
Stop your omega-3 fatty acid pill for a month. Discuss with Dr Meda Coffee whether to go back on this. It may help your reflux and cough to take a break from it.  Restart nexium 40mg  daily for now. Stop pepcid

## 2017-07-16 NOTE — Patient Instructions (Addendum)
Stop your omega-3 fatty acid pill for a month. Discuss with Dr Meda Coffee whether to go back on this. It may help your reflux and cough to take a break from it.  Restart nexium 40mg  daily for now. Stop pepcid Continue your Stiolto and Arnuity.  Keep albuterol available to use 2 puffs as needed for shortness of breath.  Continue loratadine 10mg  daily.  We will perform your oxygen requalification in March 2019 Follow with Dr Lamonte Sakai in 3 months or sooner if you have any problems.

## 2017-07-16 NOTE — Assessment & Plan Note (Signed)
This is in her left thigh it appears to be musculoskeletal.  I do not feel any cords, calf tenderness, calf swelling.  Discussed with her that she is at some risk for DVT.  If the pain continues then we will likely workup further.

## 2017-07-16 NOTE — Assessment & Plan Note (Signed)
Continue loratadine 

## 2017-07-16 NOTE — Assessment & Plan Note (Signed)
Aggressive GERD and rhinitis therapy.  She uses Tessalon as needed

## 2017-07-18 ENCOUNTER — Inpatient Hospital Stay: Payer: Medicare Other

## 2017-07-18 ENCOUNTER — Encounter: Payer: Self-pay | Admitting: Nurse Practitioner

## 2017-07-18 ENCOUNTER — Telehealth: Payer: Self-pay | Admitting: Nurse Practitioner

## 2017-07-18 ENCOUNTER — Inpatient Hospital Stay: Payer: Medicare Other | Attending: Nurse Practitioner | Admitting: Nurse Practitioner

## 2017-07-18 VITALS — BP 141/54 | HR 79 | Resp 19

## 2017-07-18 VITALS — BP 126/55 | HR 89 | Temp 98.0°F | Resp 18 | Ht 61.0 in | Wt 189.3 lb

## 2017-07-18 DIAGNOSIS — C5702 Malignant neoplasm of left fallopian tube: Secondary | ICD-10-CM | POA: Diagnosis not present

## 2017-07-18 DIAGNOSIS — D696 Thrombocytopenia, unspecified: Secondary | ICD-10-CM | POA: Insufficient documentation

## 2017-07-18 DIAGNOSIS — C5700 Malignant neoplasm of unspecified fallopian tube: Secondary | ICD-10-CM

## 2017-07-18 DIAGNOSIS — C5701 Malignant neoplasm of right fallopian tube: Secondary | ICD-10-CM | POA: Insufficient documentation

## 2017-07-18 DIAGNOSIS — J449 Chronic obstructive pulmonary disease, unspecified: Secondary | ICD-10-CM | POA: Insufficient documentation

## 2017-07-18 DIAGNOSIS — C482 Malignant neoplasm of peritoneum, unspecified: Secondary | ICD-10-CM

## 2017-07-18 DIAGNOSIS — Z79899 Other long term (current) drug therapy: Secondary | ICD-10-CM

## 2017-07-18 DIAGNOSIS — C562 Malignant neoplasm of left ovary: Secondary | ICD-10-CM

## 2017-07-18 DIAGNOSIS — J910 Malignant pleural effusion: Secondary | ICD-10-CM | POA: Diagnosis not present

## 2017-07-18 DIAGNOSIS — Z905 Acquired absence of kidney: Secondary | ICD-10-CM | POA: Insufficient documentation

## 2017-07-18 DIAGNOSIS — R188 Other ascites: Secondary | ICD-10-CM | POA: Diagnosis not present

## 2017-07-18 DIAGNOSIS — N1330 Unspecified hydronephrosis: Secondary | ICD-10-CM | POA: Diagnosis not present

## 2017-07-18 DIAGNOSIS — C561 Malignant neoplasm of right ovary: Secondary | ICD-10-CM | POA: Diagnosis not present

## 2017-07-18 DIAGNOSIS — C786 Secondary malignant neoplasm of retroperitoneum and peritoneum: Secondary | ICD-10-CM | POA: Insufficient documentation

## 2017-07-18 DIAGNOSIS — K5900 Constipation, unspecified: Secondary | ICD-10-CM | POA: Insufficient documentation

## 2017-07-18 DIAGNOSIS — Z5111 Encounter for antineoplastic chemotherapy: Secondary | ICD-10-CM | POA: Insufficient documentation

## 2017-07-18 DIAGNOSIS — Z95828 Presence of other vascular implants and grafts: Secondary | ICD-10-CM

## 2017-07-18 DIAGNOSIS — C569 Malignant neoplasm of unspecified ovary: Secondary | ICD-10-CM

## 2017-07-18 LAB — CBC WITH DIFFERENTIAL/PLATELET
Abs Granulocyte: 2.3 10*3/uL (ref 1.5–6.5)
BASOS ABS: 0 10*3/uL (ref 0.0–0.1)
BASOS PCT: 1 %
EOS ABS: 0.5 10*3/uL (ref 0.0–0.5)
EOS PCT: 11 %
HCT: 35.6 % (ref 34.8–46.6)
Hemoglobin: 11.3 g/dL — ABNORMAL LOW (ref 11.6–15.9)
LYMPHS ABS: 1.1 10*3/uL (ref 0.9–3.3)
Lymphocytes Relative: 24 %
MCH: 29.3 pg (ref 25.1–34.0)
MCHC: 31.7 g/dL (ref 31.5–36.0)
MCV: 92.5 fL (ref 79.5–101.0)
Monocytes Absolute: 0.6 10*3/uL (ref 0.1–0.9)
Monocytes Relative: 13 %
NEUTROS PCT: 51 %
Neutro Abs: 2.3 10*3/uL (ref 1.5–6.5)
PLATELETS: 281 10*3/uL (ref 145–400)
RBC: 3.84 MIL/uL (ref 3.70–5.45)
RDW: 20.2 % — ABNORMAL HIGH (ref 11.2–16.1)
WBC: 4.5 10*3/uL (ref 3.9–10.3)

## 2017-07-18 LAB — COMPREHENSIVE METABOLIC PANEL
ALK PHOS: 70 U/L (ref 40–150)
ALT: 10 U/L (ref 0–55)
AST: 12 U/L (ref 5–34)
Albumin: 3.1 g/dL — ABNORMAL LOW (ref 3.5–5.0)
Anion gap: 9 (ref 3–11)
BILIRUBIN TOTAL: 0.7 mg/dL (ref 0.2–1.2)
BUN: 17 mg/dL (ref 7–26)
CALCIUM: 9.2 mg/dL (ref 8.4–10.4)
CO2: 29 mmol/L (ref 22–29)
CREATININE: 1.3 mg/dL — AB (ref 0.60–1.10)
Chloride: 100 mmol/L (ref 98–109)
GFR, EST AFRICAN AMERICAN: 46 mL/min — AB (ref >60)
GFR, EST NON AFRICAN AMERICAN: 39 mL/min — AB (ref >60)
Glucose, Bld: 147 mg/dL — ABNORMAL HIGH (ref 70–140)
Potassium: 4 mmol/L (ref 3.3–4.7)
Sodium: 138 mmol/L (ref 136–145)
TOTAL PROTEIN: 6.6 g/dL (ref 6.4–8.3)

## 2017-07-18 MED ORDER — SODIUM CHLORIDE 0.9% FLUSH
10.0000 mL | INTRAVENOUS | Status: DC | PRN
  Administered 2017-07-18: 10 mL
  Filled 2017-07-18: qty 10

## 2017-07-18 MED ORDER — DIPHENHYDRAMINE HCL 25 MG PO CAPS
ORAL_CAPSULE | ORAL | Status: AC
  Filled 2017-07-18: qty 1

## 2017-07-18 MED ORDER — FAMOTIDINE IN NACL 20-0.9 MG/50ML-% IV SOLN
20.0000 mg | Freq: Once | INTRAVENOUS | Status: AC
  Administered 2017-07-18: 20 mg via INTRAVENOUS

## 2017-07-18 MED ORDER — HEPARIN SOD (PORK) LOCK FLUSH 100 UNIT/ML IV SOLN
500.0000 [IU] | Freq: Once | INTRAVENOUS | Status: AC | PRN
  Administered 2017-07-18: 500 [IU]
  Filled 2017-07-18: qty 5

## 2017-07-18 MED ORDER — PACLITAXEL CHEMO INJECTION 300 MG/50ML
80.0000 mg/m2 | Freq: Once | INTRAVENOUS | Status: AC
  Administered 2017-07-18: 150 mg via INTRAVENOUS
  Filled 2017-07-18: qty 25

## 2017-07-18 MED ORDER — FAMOTIDINE IN NACL 20-0.9 MG/50ML-% IV SOLN
INTRAVENOUS | Status: AC
  Filled 2017-07-18: qty 50

## 2017-07-18 MED ORDER — SODIUM CHLORIDE 0.9 % IV SOLN
Freq: Once | INTRAVENOUS | Status: AC
  Administered 2017-07-18: 13:00:00 via INTRAVENOUS

## 2017-07-18 MED ORDER — SODIUM CHLORIDE 0.9 % IJ SOLN
10.0000 mL | INTRAMUSCULAR | Status: DC | PRN
  Administered 2017-07-18: 10 mL via INTRAVENOUS
  Filled 2017-07-18: qty 10

## 2017-07-18 MED ORDER — DIPHENHYDRAMINE HCL 25 MG PO TABS
25.0000 mg | ORAL_TABLET | Freq: Once | ORAL | Status: AC
  Administered 2017-07-18: 25 mg via ORAL
  Filled 2017-07-18: qty 1

## 2017-07-18 MED ORDER — DEXAMETHASONE SODIUM PHOSPHATE 10 MG/ML IJ SOLN
5.0000 mg | Freq: Once | INTRAMUSCULAR | Status: AC
  Administered 2017-07-18: 5 mg via INTRAVENOUS

## 2017-07-18 MED ORDER — DEXAMETHASONE SODIUM PHOSPHATE 10 MG/ML IJ SOLN
INTRAMUSCULAR | Status: AC
  Filled 2017-07-18: qty 1

## 2017-07-18 MED ORDER — SODIUM CHLORIDE 0.9 % IV SOLN
900.0000 mg | Freq: Once | INTRAVENOUS | Status: AC
  Administered 2017-07-18: 900 mg via INTRAVENOUS
  Filled 2017-07-18: qty 32

## 2017-07-18 NOTE — Progress Notes (Addendum)
[facility] OFFICE PROGRESS NOTE   Diagnosis: Fallopian tube carcinoma  INTERVAL HISTORY:   Lynn Ramirez returns as scheduled.  She completed another treatment with Taxol/Avastin 07/04/2017.  Vomiting around the time of treatment.  Yesterday she had an episode of nausea, abdominal pain and diarrhea.  Her husband thinks this was due to eating 2 salads.  Symptoms have resolved.  She reports a recent fall.  She has discomfort at the left posterior thigh.  Stable neuropathy symptoms.  She feels like she needs a treatment break.  Objective:  Vital signs in last 24 hours:  Blood pressure (!) 126/55, pulse 89, temperature 98 F (36.7 C), temperature source Oral, resp. rate 18, height 5\' 1"  (1.549 m), weight 189 lb 4.8 oz (85.9 kg), SpO2 97 %.    HEENT: No thrush or ulcers. Resp: Lungs clear bilaterally. Cardio: Regular rate and rhythm. GI: Abdomen is soft.  Tender at the upper abdomen.  No hepatomegaly. Vascular: No leg edema.  Calves soft and nontender. Port-A-Cath without erythema.  Lab Results:  Lab Results  Component Value Date   WBC 4.5 07/18/2017   HGB 11.3 (L) 07/18/2017   HCT 35.6 07/18/2017   MCV 92.5 07/18/2017   PLT 281 07/18/2017   NEUTROABS 2.3 07/18/2017    Imaging:  No results found.  Medications: I have reviewed the patient's current medications.  Assessment/Plan: 1. 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, immunohistochemical profile consistent with a GYN primary, elevated CA 125   Staging CTs of the chest, abdomen, and pelvis on 10/06/2014 revealed a loculated right pleural effusion, ascites, and omental/mesenteric thickening   Cytology from peritoneal fluid 10/13/2014 revealed malignant cells consistent with metastatic adenocarcinoma Biopsy of an omental mass  on 11/02/2014 revealed invasive serous carcinoma with psammoma bodies Cycle 1 Taxol/carboplatin 10/28/2014 Cycle 2 Taxol/carboplatin 11/18/2014 Cycle 3 Taxol/carboplatin 12/09/2014  CT scan 12/23/2014 with interval improvement in peritoneal carcinomatosis with near-complete resolution of ascites and decreased omental nodularity. Significant improvement in malignant right pleural effusion.  Status post robotic-assisted laparoscopic hysterectomy with bilateral salpingoophorectomy, omentectomy, radical tumor debulking 12/29/2014. Per Dr. Serita Grit office note 01/14/2015 cytoreduction was optimal with residual disease remaining only in the 1 mm implants on the small intestine. 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 Cycle 1 adjuvant Taxol/carboplatin 01/20/2015 Cycle 2 adjuvant Taxol/carboplatin 02/10/2015 Cycle 3 adjuvant Taxol/carboplatin 03/10/2015 CA 125 on 1013 2016-42  CTs of the chest, abdomen, and pelvis 05/31/2015 with no evidence of progressive ovarian cancer  CTs of the chest, abdomen, and pelvis 08/07/2015 and 08/08/2015-no evidence of progressive ovarian cancer  Peritoneal studding noted at the time of the cholecystectomy procedure 08/09/2015 Elevated CA 125 10/07/2015  CT 10/07/2015 with stricturing at the sigmoid colon, constipation, and omental nodularity  Initiation of salvage weekly Taxol chemotherapy 10/13/2015  Taxol changed to every 2 weeks beginning 01/19/2016 due to painful neuropathy.  CT scans 07/19/2016 with no acute findings. No features in the abdomen or pelvis to suggest recurrent disease.Stable mild fullness right intrarenal collecting system.  Elevated CA 125 treatment resumed  with Taxol/Avastin on a 2 week schedule 09/27/2016  CT 02/12/2017-no evidence of carcinomatosis, no evidence of progressive metastatic disease  Taxol/Avastin continued every 2 weeks 2. COPD-followed by Dr. Lamonte Sakai. 3. Dyspnea secondary to COPD and the large  right pleural effusion, status post therapeutic thoracentesis procedures 09/30/2014,10/09/2014, and 10/21/2014. Left thoracentesis 10/30/2014 4. Left nephrectomy as a child 5. Delayed nausea following cycle 1 Taxol/carboplatin, Aloxi/Emend added with cycle 2 with improvement. 6. Right lower extremity edema, right calf pain 12/09/2014. Negative venous Doppler 12/09/2014. 7. Diffuse pruritus following cycle 1 adjuvant Taxol/carboplatin 01/20/2015, no rash, resolved with a steroid dose pack 8. Thrombocytopenia second to chemotherapy, the carboplatin was dose reduced with cycle 2 adjuvant Taxol/carboplatin 02/10/2015 9. Chemotherapy-induced peripheral neuropathy-painful peripheral neuropathy involving the feet 01/19/2016.  10. Admission with acute cholecystitis 08/07/2015, status post a cholecystectomy 08/09/2015 11. Admission 10/08/2015 with abdominal pain/constipation, improved with bowel rest and laxatives 12. Mild right hydronephrosis noted on the CT 10/07/2015. Renal ultrasound 12/16/2015 with no hydronephrosis noted.No hydronephrosis on CT 07/19/2016.    Disposition: Lynn Ramirez appears unchanged.  There is no clinical evidence of disease progression.  The plan is to continue Taxol/Avastin every 2 weeks.  We will follow-up on the CA 125 from today.  She has a family vacation planned in May.  She would like to take a treatment break around this time.  She understands we will work around any plans that she has.  She will return for a follow-up visit and treatment in 2 weeks.  She will contact the office in the interim with any problems.  Patient seen with Dr. Benay Spice.    Ned Card ANP/GNP-BC   07/18/2017  12:17 PM  This was a  shared visit with Ned Card.  We discussed the indication for continuing Taxol/Avastin versus a treatment break with Lynn Ramirez and her husband.  The plan is to continue Taxol/Avastin unless there is clinical evidence of disease progression.  Julieanne Manson, MD

## 2017-07-18 NOTE — Telephone Encounter (Signed)
Scheduled appt per 1/9 los - Gave patient aVS and calender per los.

## 2017-07-18 NOTE — Patient Instructions (Signed)
[facility] Discharge Instructions for Patients Receiving Chemotherapy  Today you received the following chemotherapy agents: Avastin & Paclitaxel.   To help prevent nausea and vomiting after your treatment, we encourage you to take your nausea medication as directed.  If you develop nausea and vomiting that is not controlled by your nausea medication, call the clinic.   BELOW ARE SYMPTOMS THAT SHOULD BE REPORTED IMMEDIATELY:  *FEVER GREATER THAN 100.5 F  *CHILLS WITH OR WITHOUT FEVER  NAUSEA AND VOMITING THAT IS NOT CONTROLLED WITH YOUR NAUSEA MEDICATION  *UNUSUAL SHORTNESS OF BREATH  *UNUSUAL BRUISING OR BLEEDING  TENDERNESS IN MOUTH AND THROAT WITH OR WITHOUT PRESENCE OF ULCERS  *URINARY PROBLEMS  *BOWEL PROBLEMS  UNUSUAL RASH Items with * indicate a potential emergency and should be followed up as soon as possible.  Feel free to call the clinic should you have any questions or concerns. The clinic phone number is [phone].  Please show the CHEMO ALERT CARD at check-in to the Emergency Department and triage nurse.   

## 2017-07-19 ENCOUNTER — Ambulatory Visit (HOSPITAL_COMMUNITY): Payer: Medicare Other | Attending: Cardiovascular Disease

## 2017-07-19 ENCOUNTER — Telehealth: Payer: Self-pay

## 2017-07-19 ENCOUNTER — Other Ambulatory Visit: Payer: Self-pay

## 2017-07-19 DIAGNOSIS — J449 Chronic obstructive pulmonary disease, unspecified: Secondary | ICD-10-CM | POA: Diagnosis not present

## 2017-07-19 DIAGNOSIS — I2510 Atherosclerotic heart disease of native coronary artery without angina pectoris: Secondary | ICD-10-CM | POA: Diagnosis not present

## 2017-07-19 DIAGNOSIS — E785 Hyperlipidemia, unspecified: Secondary | ICD-10-CM | POA: Diagnosis not present

## 2017-07-19 DIAGNOSIS — J90 Pleural effusion, not elsewhere classified: Secondary | ICD-10-CM | POA: Diagnosis not present

## 2017-07-19 DIAGNOSIS — N189 Chronic kidney disease, unspecified: Secondary | ICD-10-CM | POA: Insufficient documentation

## 2017-07-19 DIAGNOSIS — I130 Hypertensive heart and chronic kidney disease with heart failure and stage 1 through stage 4 chronic kidney disease, or unspecified chronic kidney disease: Secondary | ICD-10-CM | POA: Insufficient documentation

## 2017-07-19 DIAGNOSIS — Z8249 Family history of ischemic heart disease and other diseases of the circulatory system: Secondary | ICD-10-CM | POA: Diagnosis not present

## 2017-07-19 DIAGNOSIS — I059 Rheumatic mitral valve disease, unspecified: Secondary | ICD-10-CM | POA: Diagnosis not present

## 2017-07-19 DIAGNOSIS — I428 Other cardiomyopathies: Secondary | ICD-10-CM | POA: Diagnosis not present

## 2017-07-19 DIAGNOSIS — Z79899 Other long term (current) drug therapy: Secondary | ICD-10-CM | POA: Diagnosis not present

## 2017-07-19 DIAGNOSIS — Z87891 Personal history of nicotine dependence: Secondary | ICD-10-CM | POA: Diagnosis not present

## 2017-07-19 DIAGNOSIS — Z9221 Personal history of antineoplastic chemotherapy: Secondary | ICD-10-CM | POA: Insufficient documentation

## 2017-07-19 DIAGNOSIS — I5032 Chronic diastolic (congestive) heart failure: Secondary | ICD-10-CM | POA: Insufficient documentation

## 2017-07-19 DIAGNOSIS — C569 Malignant neoplasm of unspecified ovary: Secondary | ICD-10-CM | POA: Insufficient documentation

## 2017-07-19 LAB — CA 125: Cancer Antigen (CA) 125: 572.2 U/mL — ABNORMAL HIGH (ref 0.0–38.1)

## 2017-07-19 NOTE — Telephone Encounter (Signed)
Spoke with patient regarding lab results. CA 125 elevated. Per Dr. Benay Spice, CEA mildly elevated, not concerned with one time elevated reading. Will assess for trend. Patient also voiced concerned about CR and GFR levels. Per, MD, will monitor and further assess if needed, current treatment plan should not effect kidney function. Patient voiced understanding.

## 2017-07-23 ENCOUNTER — Encounter: Payer: Self-pay | Admitting: Cardiology

## 2017-07-23 ENCOUNTER — Ambulatory Visit (INDEPENDENT_AMBULATORY_CARE_PROVIDER_SITE_OTHER): Payer: Medicare Other | Admitting: Cardiology

## 2017-07-23 VITALS — BP 120/60 | HR 84 | Ht 61.0 in | Wt 185.0 lb

## 2017-07-23 DIAGNOSIS — I10 Essential (primary) hypertension: Secondary | ICD-10-CM | POA: Diagnosis not present

## 2017-07-23 DIAGNOSIS — Z79899 Other long term (current) drug therapy: Secondary | ICD-10-CM

## 2017-07-23 DIAGNOSIS — I428 Other cardiomyopathies: Secondary | ICD-10-CM

## 2017-07-23 DIAGNOSIS — E785 Hyperlipidemia, unspecified: Secondary | ICD-10-CM | POA: Diagnosis not present

## 2017-07-23 NOTE — Patient Instructions (Signed)
Medication Instructions:   Your physician recommends that you continue on your current medications as directed. Please refer to the Current Medication list given to you today.    Testing/Procedures:  Your physician has requested that you have an echocardiogram. Echocardiography is a painless test that uses sound waves to create images of your heart. It provides your doctor with information about the size and shape of your heart and how well your heart's chambers and valves are working. This procedure takes approximately one hour. There are no restrictions for this procedure.  PLEASE HAVE YOUR ECHO SCHEDULED FOR April 2019--ECHO SHOULD BE DONE WITH STRAIN     Follow-Up:  3 MONTHS WITH DR Meda Coffee       If you need a refill on your cardiac medications before your next appointment, please call your pharmacy.

## 2017-07-23 NOTE — Progress Notes (Signed)
Cardiology Office Note    Date:  07/23/2017  ID:  Lynn Ramirez, DOB 03/19/1941, MRN [id number] PCP:  Lynn Minium, MD  Cardiologist:  Dr. Meda Coffee   Chief Complaint: 3 months follow-up  History of Present Illness:  Lynn Ramirez is a 77 y.o. 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 She has had prior malignant right pleural effusion c/w GYN primary, thus has been treated by oncology for metastatic ovarian cancer and peritoneal carcinomatosis. She has undergone laparoscopic hysterectomy with bilateral salpingoophorectomy, omentectomy, radical tumor debulking and is now treated with Taxol and Avastin. She has been followed by Dr. Meda Coffee for her history of abnormal calcifications seen on a CT scan in 2016. Lexiscan nuclear stress test 04/2015 did not show any significant ischemia, only fixed defect in apex location and breat attenuation. She also has had issues with HTN while on Avastin.   04/26/2017 - patient states that she feels about the same, she continues to use home oxygen via nasal cannula 24/7. Earlier disease year she was taken away from her chemotherapy for 2 months to get break from side effects, she is being followed by Dr. Learta Codding. He mention in his note that she may be a candidate for repeat treatment with carboplatin. She continues treatment with Taxol/ Avastin, last cycle completed on 04/11/2017.  07/23/2017 - 3 months follow-up, the patient states that her symptoms have been stable, she is on home oxygen, and has stable dyspnea on exertion she underwent an echocardiogram that showed improved LVEF and global longitudinal strain, she denies any lower extremity edema orthopnea paroxysmal nocturnal dyspnea. However she states that she is getting more and more  tired of chemotherapy and feels like she might not tolerate it for too much longer.  2D echo: 10/2016 showed EF 55-60%, grade 1 DD, indeterminate LV filling pressure 01/03/17: 55-60%, global longitudinal strain -16% 04/19/17: 50-55%, global longitudinal strain -16% 07/2017: LVEF 60-65%, global longitudinal strain -20%  Past Medical History:  Diagnosis Date  . CKD (chronic kidney disease), stage II   . COPD (chronic obstructive pulmonary disease) ([place])   . Coronary artery calcification seen on CT scan   . Difficulty sleeping   . Eczema    hands  . Emphysema   . GERD (gastroesophageal reflux disease)   . H/O hydronephrosis   . History of transfusion    age 46  . Hyperlipidemia   . Hypertension   . Ovarian cancer ([place]) dx'd 09/2014   metastatic - prior malignant R pleural effusion and peritoneal carcinomatosis  . S/p nephrectomy     Past Surgical History:  Procedure Laterality Date  . ABDOMINAL HYSTERECTOMY    . CHOLECYSTECTOMY N/A 08/09/2015   Procedure: Attempted LAPAROSCOPIC coverted  open CHOLECYSTECTOMY WITH INTRAOPERATIVE CHOLANGIOGRAM;  Surgeon: Autumn Messing III, MD;  Location: [facility];  Service: General;  Laterality: N/A;  . HAND SURGERY Right 1980's  . LAPAROTOMY N/A 12/29/2014   Procedure:  LAPAROTOMY;  Surgeon: Everitt Amber, MD;  Location: [facility];  Service: Gynecology;  Laterality: N/A;  . NEPHRECTOMY    . ROBOTIC ASSISTED TOTAL HYSTERECTOMY WITH BILATERAL SALPINGO OOPHERECTOMY Bilateral 12/29/2014   Procedure: ROBOTIC ASSISTED TOTAL LAPAROSCOPIC HYSTERECTOMY WITH BILATERAL SALPINGO OOPHORECTOMY AND OOMENTECTOMY WITH RADICAL TUMOR [place] ;  Surgeon: Everitt Amber, MD;  Location: [facility];  Service: Gynecology;  Laterality: Bilateral;  . THORACENTESIS     several  . TONSILLECTOMY    . TUBAL LIGATION      Current Medications: Current Meds  Medication Sig  . Acetaminophen (TYLENOL) 325 MG CAPS Take 1 tablet by mouth 3 (three) times daily as needed (pain).   Marland Kitchen albuterol  (PROVENTIL) (2.5 MG/3ML) 0.083% nebulizer solution Take 3 mLs (2.5 mg total) by nebulization every 6 (six) hours as needed for wheezing or shortness of breath.  Marland Kitchen antiseptic oral rinse (BIOTENE) LIQD 15 mLs by Mouth Rinse route 3 (three) times daily.  Marland Kitchen atorvastatin (LIPITOR) 10 MG tablet TAKE 1 TABLET(10 MG) BY MOUTH DAILY  . benzonatate (TESSALON) 100 MG capsule Take 1 capsule (100 mg total) by mouth every 6 (six) hours as needed for cough.  . Biotin 2500 MCG CAPS Take 1 tablet by mouth daily.  . carvedilol (COREG) 3.125 MG tablet TAKE 1 TABLET(3.125 MG) BY MOUTH TWICE DAILY WITH A MEAL  . cholecalciferol (VITAMIN D) 1000 units tablet Take 1,000 Units by mouth daily.  . diphenoxylate-atropine (LOMOTIL) 2.5-0.025 MG tablet TAKE 2 TABLETS BY MOUTH FOUR TIMES DAILY AS NEEDED FOR DIARRHEA OR LOOSE STOOLS  . esomeprazole (NEXIUM) 40 MG capsule Take 1 capsule (40 mg total) by mouth daily.  . fluocinonide cream (LIDEX) 4.01 % Apply 1 application topically 2 (two) times daily as needed (For ezcema).   . Fluticasone Furoate (ARNUITY ELLIPTA) 200 MCG/ACT AEPB Inhale 1 puff into the lungs daily.  Marland Kitchen gabapentin (NEURONTIN) 300 MG capsule Take 1 capsule (300 mg total) by mouth at bedtime.  . lidocaine-prilocaine (EMLA) cream Apply to port site one hour prior to use. Do not rub in. Cover with plastic.  Marland Kitchen loratadine (CLARITIN) 10 MG tablet Take 10 mg by mouth daily.  . ondansetron (ZOFRAN-ODT) 4 MG disintegrating tablet Take 1 tablet (4 mg total) by mouth every 8 (eight) hours as needed for nausea or vomiting.  Marland Kitchen oxyCODONE-acetaminophen (PERCOCET/ROXICET) 5-325 MG tablet Take 1 tablet by mouth every 6 (six) hours as needed for severe pain.  Marland Kitchen PROAIR HFA 108 (90 Base) MCG/ACT inhaler INHALE 2 PUFFS INTO THE LUNGS FOUR TIMES DAILY AS NEEDED FOR WHEEZING  . sorbitol 70 % solution Take 30 cc twice daily until bowel movement then once daily  . Tiotropium Bromide-Olodaterol (STIOLTO RESPIMAT) 2.5-2.5 MCG/ACT AERS  Inhale 2 puffs into the lungs daily.  . traMADol (ULTRAM) 50 MG tablet Take 1 tablet (50 mg total) by mouth every 12 (twelve) hours as needed. for pain  . triamterene-hydrochlorothiazide (MAXZIDE-25) 37.5-25 MG tablet Take 0.5 tablets by mouth daily.   . vitamin B-12 (CYANOCOBALAMIN) 100 MCG tablet Take 100 mcg by mouth daily. Reported on 01/19/2016  . zolpidem (AMBIEN) 5 MG tablet Take 1 tablet (5 mg total) by mouth at bedtime as needed for sleep.    Allergies:   Latex and Penicillins   Social History   Socioeconomic History  . Marital status: Married    Spouse name: None  . Number of children: 3  . Years of education: None  . Highest education level: None  Social Needs  . Financial resource strain: None  . Food insecurity - worry: None  . Food insecurity - inability: None  . Transportation needs - medical: None  . Transportation needs - non-medical: None  Occupational History  . Occupation: RETIRED    Employer: RETIRED  Tobacco Use  . Smoking status: Former Smoker    Packs/day: 1.00    Years: 40.00    Pack  years: 40.00    Types: Cigarettes    Last attempt to quit: 07/10/2013    Years since quitting: 4.0  . Smokeless tobacco: Never Used  Substance and Sexual Activity  . Alcohol use: Yes    Alcohol/week: 0.0 oz    Comment: 1-2 a week  . Drug use: No  . Sexual activity: None  Other Topics Concern  . None  Social History Narrative  . None    Family History:  Family History  Problem Relation Age of Onset  . Diabetes Father   . Lung cancer Father 23       metastasis to liver  . Cancer Father        liver  . Pancreatic cancer Mother 17  . Stroke Paternal Grandfather   . Heart disease Maternal Aunt   . Diabetes Paternal Uncle   . Heart Problems Maternal Grandmother   . Heart Problems Maternal Grandfather   . Diabetes Paternal Grandmother   . Stroke Paternal Grandmother   . Heart Problems Paternal Uncle   . Cancer Cousin        unknown type  . Breast cancer  Cousin        dx. 60s  . Leukemia Cousin        dx. 16-17  . Colon cancer Neg Hx   . Stomach cancer Neg Hx    ROS:   Please see the history of present illness. Has rare nosebleeds after chemo but no significant dripping - only minimal blood when wiping that resolves quickly (has been discussed with oncology) All other systems are reviewed and otherwise negative.   PHYSICAL EXAM:   VS:  BP 120/60   Pulse 84   Ht 5\' 1"  (1.549 m)   Wt 185 lb (83.9 kg)   SpO2 96%   BMI 34.96 kg/m   BMI: Body mass index is 34.96 kg/m. GEN: Well nourished, well developed obese WF, in no acute distress , on O2 via [place] HEENT: normocephalic, atraumatic Neck: no JVD, carotid bruits, or masses Cardiac: RRR; no murmurs, rubs, or gallops, no edema  Respiratory:  Diffusely diminished but no wheezing, rales or rhonchi, normal work of breathing GI: soft, nontender, nondistended, + BS MS: no deformity or atrophy  Skin: warm and dry, no rash Neuro:  Alert and Oriented x 3, Strength and sensation are intact, follows commands Psych: euthymic mood, full affect  Wt Readings from Last 3 Encounters:  07/23/17 185 lb (83.9 kg)  07/18/17 189 lb 4.8 oz (85.9 kg)  07/16/17 188 lb 6.4 oz (85.5 kg)    Studies/Labs Reviewed:   EKG:  EKG was not done today  Recent Labs: 08/07/2016: Magnesium 1.9 10/18/2016: B Natriuretic Peptide 32.5 07/18/2017: ALT 10; BUN 17; Creatinine, Ser 1.30; Hemoglobin 11.3; Platelets 281; Potassium 4.0; Sodium 138   Lipid Panel    Component Value Date/Time   CHOL 135 12/25/2016 0957   TRIG 119 12/25/2016 0957   HDL 33 (L) 12/25/2016 0957   CHOLHDL 4.1 12/25/2016 0957   CHOLHDL 3.6 01/03/2016 0831   VLDL 34 (H) 01/03/2016 0831   LDLCALC 78 12/25/2016 0957    Additional studies/ records that were reviewed today include: Summarized above  EKG performed today 07/23/2017 showed normal sinus rhythm rightward axis, unchanged from prior this was personally reviewed.   ASSESSMENT & PLAN:    1. Nonischemic cardiomyopathy - secondary to chemotherapy She continues treatment with Taxol/ Avastin every other week, she has profound fatigue 2 days after chemotherapy.Most recent echocardiogram from  January 2019 shows LVEF low normal 60-65 % and improvement off global longitudinal strain from 16-20%. She is okay to continue the same chemotherapy, we'll repeat echo with strain in April 2019 followed by a visit with [place]. 2. Coronary calcification by CT - no recent anginal symptoms. Lipids controlled. Prior low risk nuc. Continue surveillance for symptoms. She will stop using fish oil as her pulmonologist stated it might contribute to her cough. 3. Essential HTN - controlled. 4. Hyperlipidemia - on atorvastatin that is tolerated well. Ovarian cancer with metastasis currently on chemotherapy with Avastin and Taxol - management as above.   Disposition: F/u with Dr. Meda Coffee in 3 months with echo prior to the appointment.  Medication Adjustments/Labs and Tests Ordered: Current medicines are reviewed at length with the patient today.  Concerns regarding medicines are outlined above. Medication changes, Labs and Tests ordered today are summarized above and listed in the Patient Instructions accessible in Encounters.   Signed, Ena Dawley, MD  07/23/2017 10:46 AM    [facility] [address], [place], [place]  [address] Phone: [phone]; Fax: [phone]

## 2017-07-29 ENCOUNTER — Other Ambulatory Visit: Payer: Self-pay | Admitting: Oncology

## 2017-08-01 ENCOUNTER — Inpatient Hospital Stay: Payer: Medicare Other

## 2017-08-01 ENCOUNTER — Other Ambulatory Visit: Payer: Self-pay | Admitting: Nurse Practitioner

## 2017-08-01 ENCOUNTER — Ambulatory Visit (HOSPITAL_COMMUNITY)
Admission: RE | Admit: 2017-08-01 | Discharge: 2017-08-01 | Disposition: A | Discharge status: Home / Self Care | Payer: Medicare Other | Source: Ambulatory Visit | Attending: Nurse Practitioner | Admitting: Nurse Practitioner

## 2017-08-01 ENCOUNTER — Inpatient Hospital Stay (HOSPITAL_BASED_OUTPATIENT_CLINIC_OR_DEPARTMENT_OTHER): Payer: Medicare Other | Admitting: Nurse Practitioner

## 2017-08-01 ENCOUNTER — Encounter: Payer: Self-pay | Admitting: Nurse Practitioner

## 2017-08-01 ENCOUNTER — Telehealth: Payer: Self-pay | Admitting: Nurse Practitioner

## 2017-08-01 VITALS — BP 144/69 | HR 89 | Temp 97.1°F | Resp 13 | Wt 188.0 lb

## 2017-08-01 VITALS — BP 143/54 | HR 81 | Resp 18

## 2017-08-01 DIAGNOSIS — C562 Malignant neoplasm of left ovary: Secondary | ICD-10-CM | POA: Diagnosis not present

## 2017-08-01 DIAGNOSIS — C5702 Malignant neoplasm of left fallopian tube: Secondary | ICD-10-CM | POA: Diagnosis not present

## 2017-08-01 DIAGNOSIS — R05 Cough: Secondary | ICD-10-CM | POA: Diagnosis not present

## 2017-08-01 DIAGNOSIS — J9811 Atelectasis: Secondary | ICD-10-CM | POA: Insufficient documentation

## 2017-08-01 DIAGNOSIS — C5700 Malignant neoplasm of unspecified fallopian tube: Secondary | ICD-10-CM

## 2017-08-01 DIAGNOSIS — C5701 Malignant neoplasm of right fallopian tube: Secondary | ICD-10-CM | POA: Diagnosis not present

## 2017-08-01 DIAGNOSIS — Z95828 Presence of other vascular implants and grafts: Secondary | ICD-10-CM

## 2017-08-01 DIAGNOSIS — C786 Secondary malignant neoplasm of retroperitoneum and peritoneum: Secondary | ICD-10-CM

## 2017-08-01 DIAGNOSIS — J449 Chronic obstructive pulmonary disease, unspecified: Secondary | ICD-10-CM | POA: Diagnosis not present

## 2017-08-01 DIAGNOSIS — I700 Atherosclerosis of aorta: Secondary | ICD-10-CM | POA: Insufficient documentation

## 2017-08-01 DIAGNOSIS — D696 Thrombocytopenia, unspecified: Secondary | ICD-10-CM

## 2017-08-01 DIAGNOSIS — Z79899 Other long term (current) drug therapy: Secondary | ICD-10-CM | POA: Diagnosis not present

## 2017-08-01 DIAGNOSIS — C561 Malignant neoplasm of right ovary: Secondary | ICD-10-CM | POA: Diagnosis not present

## 2017-08-01 DIAGNOSIS — C482 Malignant neoplasm of peritoneum, unspecified: Secondary | ICD-10-CM

## 2017-08-01 DIAGNOSIS — C569 Malignant neoplasm of unspecified ovary: Secondary | ICD-10-CM

## 2017-08-01 DIAGNOSIS — Z5111 Encounter for antineoplastic chemotherapy: Secondary | ICD-10-CM | POA: Diagnosis not present

## 2017-08-01 LAB — CBC WITH DIFFERENTIAL/PLATELET
Basophils Absolute: 0 10*3/uL (ref 0.0–0.1)
Basophils Relative: 1 %
EOS PCT: 2 %
Eosinophils Absolute: 0.1 10*3/uL (ref 0.0–0.5)
HCT: 36.2 % (ref 34.8–46.6)
HEMOGLOBIN: 10.9 g/dL — AB (ref 11.6–15.9)
LYMPHS ABS: 1 10*3/uL (ref 0.9–3.3)
LYMPHS PCT: 28 %
MCH: 28.9 pg (ref 25.1–34.0)
MCHC: 30.1 g/dL — ABNORMAL LOW (ref 31.5–36.0)
MCV: 96 fL (ref 79.5–101.0)
Monocytes Absolute: 0.6 10*3/uL (ref 0.1–0.9)
Monocytes Relative: 16 %
Neutro Abs: 2 10*3/uL (ref 1.5–6.5)
Neutrophils Relative %: 53 %
PLATELETS: 369 10*3/uL (ref 145–400)
RBC: 3.77 MIL/uL (ref 3.70–5.45)
RDW: 18.3 % — ABNORMAL HIGH (ref 11.2–16.1)
WBC: 3.8 10*3/uL — AB (ref 3.9–10.3)

## 2017-08-01 LAB — COMPREHENSIVE METABOLIC PANEL
ALBUMIN: 3 g/dL — AB (ref 3.5–5.0)
ALT: 10 U/L (ref 0–55)
AST: 11 U/L (ref 5–34)
Alkaline Phosphatase: 74 U/L (ref 40–150)
Anion gap: 8 (ref 3–11)
BUN: 15 mg/dL (ref 7–26)
CHLORIDE: 97 mmol/L — AB (ref 98–109)
CO2: 32 mmol/L — ABNORMAL HIGH (ref 22–29)
Calcium: 9.8 mg/dL (ref 8.4–10.4)
Creatinine, Ser: 0.86 mg/dL (ref 0.60–1.10)
GFR calc Af Amer: >60 mL/min (ref >60)
GFR calc non Af Amer: >60 mL/min (ref >60)
Glucose, Bld: 109 mg/dL (ref 70–140)
POTASSIUM: 4.1 mmol/L (ref 3.3–4.7)
SODIUM: 137 mmol/L (ref 136–145)
Total Bilirubin: 0.6 mg/dL (ref 0.2–1.2)
Total Protein: 6.7 g/dL (ref 6.4–8.3)

## 2017-08-01 LAB — UA PROTEIN, DIPSTICK - CHCC: Protein, ur: <30 mg/dL

## 2017-08-01 MED ORDER — SODIUM CHLORIDE 0.9% FLUSH
10.0000 mL | INTRAVENOUS | Status: DC | PRN
  Administered 2017-08-01: 10 mL
  Filled 2017-08-01: qty 10

## 2017-08-01 MED ORDER — FAMOTIDINE IN NACL 20-0.9 MG/50ML-% IV SOLN
20.0000 mg | Freq: Once | INTRAVENOUS | Status: AC
  Administered 2017-08-01: 20 mg via INTRAVENOUS

## 2017-08-01 MED ORDER — SODIUM CHLORIDE 0.9 % IV SOLN
80.0000 mg/m2 | Freq: Once | INTRAVENOUS | Status: AC
  Administered 2017-08-01: 150 mg via INTRAVENOUS
  Filled 2017-08-01: qty 25

## 2017-08-01 MED ORDER — DIPHENHYDRAMINE HCL 25 MG PO TABS
25.0000 mg | ORAL_TABLET | Freq: Once | ORAL | Status: AC
  Administered 2017-08-01: 25 mg via ORAL
  Filled 2017-08-01: qty 1

## 2017-08-01 MED ORDER — HEPARIN SOD (PORK) LOCK FLUSH 100 UNIT/ML IV SOLN
500.0000 [IU] | Freq: Once | INTRAVENOUS | Status: AC | PRN
  Administered 2017-08-01: 500 [IU]
  Filled 2017-08-01: qty 5

## 2017-08-01 MED ORDER — DEXAMETHASONE SODIUM PHOSPHATE 10 MG/ML IJ SOLN
5.0000 mg | Freq: Once | INTRAMUSCULAR | Status: AC
  Administered 2017-08-01: 5 mg via INTRAVENOUS

## 2017-08-01 MED ORDER — ZOLPIDEM TARTRATE 5 MG PO TABS: 5.0000 mg | ORAL_TABLET | Freq: Every evening | ORAL | 0 refills | Status: AC | PRN

## 2017-08-01 MED ORDER — SODIUM CHLORIDE 0.9 % IV SOLN
Freq: Once | INTRAVENOUS | Status: AC
  Administered 2017-08-01: 14:00:00 via INTRAVENOUS

## 2017-08-01 MED ORDER — FAMOTIDINE IN NACL 20-0.9 MG/50ML-% IV SOLN
INTRAVENOUS | Status: AC
  Filled 2017-08-01: qty 50

## 2017-08-01 MED ORDER — SODIUM CHLORIDE 0.9 % IV SOLN
900.0000 mg | Freq: Once | INTRAVENOUS | Status: AC
  Administered 2017-08-01: 900 mg via INTRAVENOUS
  Filled 2017-08-01: qty 32

## 2017-08-01 MED ORDER — DIPHENHYDRAMINE HCL 25 MG PO CAPS
ORAL_CAPSULE | ORAL | Status: AC
  Filled 2017-08-01: qty 1

## 2017-08-01 MED ORDER — DEXAMETHASONE SODIUM PHOSPHATE 10 MG/ML IJ SOLN
INTRAMUSCULAR | Status: AC
  Filled 2017-08-01: qty 1

## 2017-08-01 MED ORDER — SODIUM CHLORIDE 0.9 % IJ SOLN
10.0000 mL | INTRAMUSCULAR | Status: DC | PRN
  Administered 2017-08-01: 10 mL via INTRAVENOUS
  Filled 2017-08-01: qty 10

## 2017-08-01 NOTE — Patient Instructions (Signed)
[facility] Discharge Instructions for Patients Receiving Chemotherapy  Today you received the following chemotherapy agents: Avastin & Paclitaxel.   To help prevent nausea and vomiting after your treatment, we encourage you to take your nausea medication as directed.  If you develop nausea and vomiting that is not controlled by your nausea medication, call the clinic.   BELOW ARE SYMPTOMS THAT SHOULD BE REPORTED IMMEDIATELY:  *FEVER GREATER THAN 100.5 F  *CHILLS WITH OR WITHOUT FEVER  NAUSEA AND VOMITING THAT IS NOT CONTROLLED WITH YOUR NAUSEA MEDICATION  *UNUSUAL SHORTNESS OF BREATH  *UNUSUAL BRUISING OR BLEEDING  TENDERNESS IN MOUTH AND THROAT WITH OR WITHOUT PRESENCE OF ULCERS  *URINARY PROBLEMS  *BOWEL PROBLEMS  UNUSUAL RASH Items with * indicate a potential emergency and should be followed up as soon as possible.  Feel free to call the clinic should you have any questions or concerns. The clinic phone number is [phone].  Please show the CHEMO ALERT CARD at check-in to the Emergency Department and triage nurse.   

## 2017-08-01 NOTE — Progress Notes (Signed)
[facility] OFFICE PROGRESS NOTE   Diagnosis: Fallopian tube carcinoma  INTERVAL HISTORY:   Lynn Ramirez returns as scheduled.  She completed another treatment with Taxol/Avastin 07/18/2017.  Denies nausea/vomiting.  No abdominal pain.  No diarrhea or constipation.  Neuropathy symptoms are unchanged.  No bleeding except mild intermittent epistaxis.  Main complaint today is a continued cough which has been ongoing for many months.  She would like to have a chest x-ray.  No fever.  She is short of breath intermittently.  The cough is productive at times.  Objective:  Vital signs in last 24 hours:  Blood pressure (!) 144/69, pulse 89, temperature (!) 97.1 F (36.2 C), temperature source Oral, resp. rate 13, weight 188 lb (85.3 kg), SpO2 97 %.    HEENT: No thrush or ulcers. Resp: Distant breath sounds.  No respiratory distress. Cardio: Regular rate and rhythm. GI: Abdomen is soft.  No hepatomegaly. Vascular: No leg edema. Port-A-Cath without erythema.  Lab Results:  Lab Results  Component Value Date   WBC 4.5 07/18/2017   HGB 11.3 (L) 07/18/2017   HCT 35.6 07/18/2017   MCV 92.5 07/18/2017   PLT 281 07/18/2017   NEUTROABS 2.3 07/18/2017    Imaging:  No results found.  Medications: I have reviewed the patient's current medications.  Assessment/Plan: 1. 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, immunohistochemical profile consistent with a GYN primary, elevated CA 125   Staging CTs of the chest, abdomen, and pelvis on 10/06/2014 revealed a loculated right pleural effusion, ascites, and omental/mesenteric thickening   Cytology from peritoneal fluid 10/13/2014 revealed malignant cells consistent with metastatic adenocarcinoma Biopsy of an omental mass on 11/02/2014 revealed invasive  serous carcinoma with psammoma bodies Cycle 1 Taxol/carboplatin 10/28/2014 Cycle 2 Taxol/carboplatin 11/18/2014 Cycle 3 Taxol/carboplatin 12/09/2014  CT scan 12/23/2014 with interval improvement in peritoneal carcinomatosis with near-complete resolution of ascites and decreased omental nodularity. Significant improvement in malignant right pleural effusion.  Status post robotic-assisted laparoscopic hysterectomy with bilateral salpingoophorectomy, omentectomy, radical tumor debulking 12/29/2014. Per Dr. Serita Grit office note 01/14/2015 cytoreduction was optimal with residual disease remaining only in the 1 mm implants on the small intestine. 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 Cycle 1 adjuvant Taxol/carboplatin 01/20/2015 Cycle 2 adjuvant Taxol/carboplatin 02/10/2015 Cycle 3 adjuvant Taxol/carboplatin 03/10/2015 CA 125 on 1013 2016-42  CTs of the chest, abdomen, and pelvis 05/31/2015 with no evidence of progressive ovarian cancer  CTs of the chest, abdomen, and pelvis 08/07/2015 and 08/08/2015-no evidence of progressive ovarian cancer  Peritoneal studding noted at the time of the cholecystectomy procedure 08/09/2015 Elevated CA 125 10/07/2015  CT 10/07/2015 with stricturing at the sigmoid colon, constipation, and omental nodularity  Initiation of salvage weekly Taxol chemotherapy 10/13/2015  Taxol changed to every 2 weeks beginning 01/19/2016 due to painful neuropathy.  CT scans 07/19/2016 with no acute findings. No features in the abdomen or pelvis to suggest recurrent disease.Stable mild fullness right intrarenal collecting system.  Elevated CA 125 treatment resumed with Taxol/Avastin on a 2 week  schedule 09/27/2016  CT 02/12/2017-no evidence of carcinomatosis, no evidence of progressive metastatic disease  Taxol/Avastin continued every 2 weeks 2. COPD-followed by Dr. Lamonte Sakai. 3. Dyspnea secondary to COPD and the large right pleural effusion, status post therapeutic thoracentesis procedures 09/30/2014,10/09/2014, and  10/21/2014. Left thoracentesis 10/30/2014 4. Left nephrectomy as a child 5. Delayed nausea following cycle 1 Taxol/carboplatin, Aloxi/Emend added with cycle 2 with improvement. 6. Right lower extremity edema, right calf pain 12/09/2014. Negative venous Doppler 12/09/2014. 7. Diffuse pruritus following cycle 1 adjuvant Taxol/carboplatin 01/20/2015, no rash, resolved with a steroid dose pack 8. Thrombocytopenia second to chemotherapy, the carboplatin was dose reduced with cycle 2 adjuvant Taxol/carboplatin 02/10/2015 9. Chemotherapy-induced peripheral neuropathy-painful peripheral neuropathy involving the feet 01/19/2016.  10. Admission with acute cholecystitis 08/07/2015, status post a cholecystectomy 08/09/2015 11. Admission 10/08/2015 with abdominal pain/constipation, improved with bowel rest and laxatives 12. Mild right hydronephrosis noted on the CT 10/07/2015. Renal ultrasound 12/16/2015 with no hydronephrosis noted.No hydronephrosis on CT 07/19/2016.      Disposition: Lynn Ramirez appears unchanged.  There is no clinical evidence of disease progression.  Plan to continue Taxol/Avastin every 2 weeks.  She has a persistent cough.  We are referring her for a chest x-ray.    She will return for a follow-up visit in 2 weeks.    Ned Card ANP/GNP-BC   08/01/2017  12:04 PM

## 2017-08-01 NOTE — Telephone Encounter (Signed)
Gave avs and calendar for february °

## 2017-08-02 LAB — CA 125: Cancer Antigen (CA) 125: 592.5 U/mL — ABNORMAL HIGH (ref 0.0–38.1)

## 2017-08-03 ENCOUNTER — Ambulatory Visit (INDEPENDENT_AMBULATORY_CARE_PROVIDER_SITE_OTHER): Payer: Medicare Other | Admitting: Family Medicine

## 2017-08-03 ENCOUNTER — Telehealth: Payer: Self-pay | Admitting: Emergency Medicine

## 2017-08-03 ENCOUNTER — Encounter: Payer: Self-pay | Admitting: Family Medicine

## 2017-08-03 ENCOUNTER — Other Ambulatory Visit: Payer: Self-pay

## 2017-08-03 ENCOUNTER — Other Ambulatory Visit: Payer: Self-pay | Admitting: Oncology

## 2017-08-03 VITALS — BP 121/82 | HR 85 | Temp 98.1°F | Resp 17 | Ht 61.0 in | Wt 187.4 lb

## 2017-08-03 DIAGNOSIS — C482 Malignant neoplasm of peritoneum, unspecified: Secondary | ICD-10-CM

## 2017-08-03 DIAGNOSIS — C5700 Malignant neoplasm of unspecified fallopian tube: Secondary | ICD-10-CM

## 2017-08-03 DIAGNOSIS — I10 Essential (primary) hypertension: Secondary | ICD-10-CM

## 2017-08-03 DIAGNOSIS — J441 Chronic obstructive pulmonary disease with (acute) exacerbation: Secondary | ICD-10-CM | POA: Diagnosis not present

## 2017-08-03 DIAGNOSIS — E785 Hyperlipidemia, unspecified: Secondary | ICD-10-CM | POA: Diagnosis not present

## 2017-08-03 LAB — LIPID PANEL
Cholesterol: 143 mg/dL (ref 0–200)
HDL: 26.7 mg/dL — AB (ref >39.00)
NONHDL: 116.79
Total CHOL/HDL Ratio: 5
Triglycerides: 282 mg/dL — ABNORMAL HIGH (ref 0.0–149.0)
VLDL: 56.4 mg/dL — ABNORMAL HIGH (ref 0.0–40.0)

## 2017-08-03 LAB — LDL CHOLESTEROL, DIRECT: LDL DIRECT: 70 mg/dL

## 2017-08-03 MED ORDER — GUAIFENESIN-CODEINE 100-10 MG/5ML PO SYRP: 10.0000 mL | ORAL_SOLUTION | Freq: Three times a day (TID) | ORAL | 0 refills | Status: DC | PRN

## 2017-08-03 MED ORDER — AZITHROMYCIN 250 MG PO TABS: ORAL_TABLET | ORAL | 0 refills | Status: DC

## 2017-08-03 NOTE — Progress Notes (Signed)
   Subjective:    Patient ID: Lynn Ramirez, female    DOB: Mar 23, 1941, 77 y.o.   MRN: [id number]  HPI HTN- chronic problem, on Coreg BID and Triamterene HCTZ 37.5/25mg  daily w/ good control.  Denies CP, SOB above baseline, HAs, visual changes, edema  Hyperlipidemia- chronic problem, on Lipitor 10mg  daily.  Recent labs show normal LFTs.  Denies abd pain, N/V.  Cough- pt had CXR on 1/23 that didn't show any acute illness.  Cough has been present x2 months.  Dr Lamonte Sakai feels cough is due to GERD despite taking Nexium 40mg  daily.  No relief w/ Tessalon or Mucinex.  Continues to take Arnuity Ellipta and Stiolto as well as rescue inhaler.   Review of Systems For ROS see HPI     Objective:   Physical Exam  Constitutional: She is oriented to person, place, and time. She appears well-developed and well-nourished. No distress.  HENT:  Head: Normocephalic and atraumatic.  Eyes: Conjunctivae and EOM are normal. Pupils are equal, round, and reactive to light.  Neck: Normal range of motion. Neck supple. No thyromegaly present.  Cardiovascular: Normal rate, regular rhythm, normal heart sounds and intact distal pulses.  No murmur heard. Pulmonary/Chest: Effort normal. No respiratory distress. She has wheezes (scattered expiratory wheezes). She has rales (R middle lobe and LLL).  Abdominal: Soft. She exhibits no distension. There is no tenderness.  Musculoskeletal: She exhibits no edema.  Lymphadenopathy:    She has no cervical adenopathy.  Neurological: She is alert and oriented to person, place, and time.  Skin: Skin is warm and dry.  Psychiatric: She has a normal mood and affect. Her behavior is normal.  Vitals reviewed.         Assessment & Plan:  COPD exacerbation- reviewed recent xray that showed atelectasis of LLL.  Pt has rales of RML and LLL along w/ wet, productive cough and hx of COPD on chemo.  Pt is 'highly allergic' to PCN.  She is asking for Zpack when I mentioned that she  needs abx.  Cough meds prn.  Reviewed supportive care and red flags that should prompt return.  Pt expressed understanding and is in agreement w/ plan.

## 2017-08-03 NOTE — Telephone Encounter (Addendum)
RX called in.   ----- Message from [name], NP sent at 08/01/2017  4:20 PM EST ----- Please call prescription for Ambien to her pharmacy.  I entered as a phone in.

## 2017-08-03 NOTE — Assessment & Plan Note (Signed)
Chronic problem.  Tolerating statin w/o difficulty.  LFTs WNL.  Check labs.  Adjust meds prn

## 2017-08-03 NOTE — Patient Instructions (Signed)
Schedule your Medicare Wellness visit and a follow up w/ me in 6 months We'll notify you of your lab results and make any changes if needed Start the [place] as directed Use the cough syrup as needed Drink plenty of fluids REST! Call with any questions or concerns Hang in there!!!

## 2017-08-03 NOTE — Assessment & Plan Note (Signed)
Chronic problem.  Well controlled today.  Asymptomatic w/ exception of chronic SOB.  Reviewed recent BMP and CBC.  No med changes at this time

## 2017-08-06 ENCOUNTER — Encounter: Payer: Self-pay | Admitting: General Practice

## 2017-08-07 DIAGNOSIS — M25562 Pain in left knee: Secondary | ICD-10-CM | POA: Diagnosis not present

## 2017-08-07 DIAGNOSIS — M170 Bilateral primary osteoarthritis of knee: Secondary | ICD-10-CM | POA: Diagnosis not present

## 2017-08-07 DIAGNOSIS — M25561 Pain in right knee: Secondary | ICD-10-CM | POA: Diagnosis not present

## 2017-08-09 ENCOUNTER — Encounter: Payer: Self-pay | Admitting: *Deleted

## 2017-08-12 ENCOUNTER — Other Ambulatory Visit: Payer: Self-pay | Admitting: Oncology

## 2017-08-14 ENCOUNTER — Other Ambulatory Visit: Payer: Self-pay | Admitting: *Deleted

## 2017-08-14 DIAGNOSIS — M170 Bilateral primary osteoarthritis of knee: Secondary | ICD-10-CM | POA: Diagnosis not present

## 2017-08-14 DIAGNOSIS — C5700 Malignant neoplasm of unspecified fallopian tube: Secondary | ICD-10-CM

## 2017-08-15 ENCOUNTER — Telehealth: Payer: Self-pay | Admitting: Oncology

## 2017-08-15 ENCOUNTER — Inpatient Hospital Stay: Payer: Medicare Other | Attending: Nurse Practitioner

## 2017-08-15 ENCOUNTER — Inpatient Hospital Stay (HOSPITAL_BASED_OUTPATIENT_CLINIC_OR_DEPARTMENT_OTHER): Payer: Medicare Other | Admitting: Oncology

## 2017-08-15 ENCOUNTER — Inpatient Hospital Stay: Payer: Medicare Other

## 2017-08-15 VITALS — BP 145/63 | HR 77 | Temp 97.5°F | Resp 18

## 2017-08-15 VITALS — BP 144/59 | HR 83 | Temp 97.6°F | Resp 18 | Ht 61.0 in | Wt 187.3 lb

## 2017-08-15 DIAGNOSIS — Z905 Acquired absence of kidney: Secondary | ICD-10-CM | POA: Diagnosis not present

## 2017-08-15 DIAGNOSIS — Z95828 Presence of other vascular implants and grafts: Secondary | ICD-10-CM

## 2017-08-15 DIAGNOSIS — C786 Secondary malignant neoplasm of retroperitoneum and peritoneum: Secondary | ICD-10-CM | POA: Insufficient documentation

## 2017-08-15 DIAGNOSIS — R188 Other ascites: Secondary | ICD-10-CM | POA: Insufficient documentation

## 2017-08-15 DIAGNOSIS — N1330 Unspecified hydronephrosis: Secondary | ICD-10-CM | POA: Diagnosis not present

## 2017-08-15 DIAGNOSIS — J449 Chronic obstructive pulmonary disease, unspecified: Secondary | ICD-10-CM | POA: Diagnosis not present

## 2017-08-15 DIAGNOSIS — Z5111 Encounter for antineoplastic chemotherapy: Secondary | ICD-10-CM | POA: Insufficient documentation

## 2017-08-15 DIAGNOSIS — C561 Malignant neoplasm of right ovary: Secondary | ICD-10-CM | POA: Diagnosis not present

## 2017-08-15 DIAGNOSIS — Z79899 Other long term (current) drug therapy: Secondary | ICD-10-CM | POA: Diagnosis not present

## 2017-08-15 DIAGNOSIS — C5700 Malignant neoplasm of unspecified fallopian tube: Secondary | ICD-10-CM

## 2017-08-15 DIAGNOSIS — C569 Malignant neoplasm of unspecified ovary: Secondary | ICD-10-CM

## 2017-08-15 DIAGNOSIS — K5900 Constipation, unspecified: Secondary | ICD-10-CM | POA: Insufficient documentation

## 2017-08-15 DIAGNOSIS — C562 Malignant neoplasm of left ovary: Secondary | ICD-10-CM

## 2017-08-15 DIAGNOSIS — C482 Malignant neoplasm of peritoneum, unspecified: Secondary | ICD-10-CM

## 2017-08-15 LAB — CBC WITH DIFFERENTIAL (CANCER CENTER ONLY)
BASOS PCT: 1 %
Basophils Absolute: 0.1 10*3/uL (ref 0.0–0.1)
EOS ABS: 0.1 10*3/uL (ref 0.0–0.5)
Eosinophils Relative: 3 %
HCT: 34.1 % — ABNORMAL LOW (ref 34.8–46.6)
HEMOGLOBIN: 11 g/dL — AB (ref 11.6–15.9)
Lymphocytes Relative: 26 %
Lymphs Abs: 0.9 10*3/uL (ref 0.9–3.3)
MCH: 29.2 pg (ref 25.1–34.0)
MCHC: 32.1 g/dL (ref 31.5–36.0)
MCV: 91.1 fL (ref 79.5–101.0)
MONOS PCT: 14 %
Monocytes Absolute: 0.5 10*3/uL (ref 0.1–0.9)
NEUTROS PCT: 56 %
Neutro Abs: 2.1 10*3/uL (ref 1.5–6.5)
Platelet Count: 317 10*3/uL (ref 145–400)
RBC: 3.75 MIL/uL (ref 3.70–5.45)
RDW: 19.6 % — ABNORMAL HIGH (ref 11.2–14.5)
WBC Count: 3.7 10*3/uL — ABNORMAL LOW (ref 3.9–10.3)

## 2017-08-15 LAB — CMP (CANCER CENTER ONLY)
ALBUMIN: 3.1 g/dL — AB (ref 3.5–5.0)
ALK PHOS: 72 U/L (ref 40–150)
ALT: 7 U/L (ref 0–55)
ANION GAP: 9 (ref 3–11)
AST: 11 U/L (ref 5–34)
BUN: 14 mg/dL (ref 7–26)
CHLORIDE: 99 mmol/L (ref 98–109)
CO2: 31 mmol/L — ABNORMAL HIGH (ref 22–29)
Calcium: 9.7 mg/dL (ref 8.4–10.4)
Creatinine: 0.91 mg/dL (ref 0.60–1.10)
GFR, Est AFR Am: >60 mL/min (ref >60)
GFR, Estimated: >60 mL/min (ref >60)
GLUCOSE: 114 mg/dL (ref 70–140)
POTASSIUM: 4.1 mmol/L (ref 3.5–5.1)
SODIUM: 139 mmol/L (ref 136–145)
Total Bilirubin: 0.4 mg/dL (ref 0.2–1.2)
Total Protein: 6.7 g/dL (ref 6.4–8.3)

## 2017-08-15 LAB — UA PROTEIN, DIPSTICK - CHCC: Protein, ur: <30 mg/dL

## 2017-08-15 MED ORDER — DIPHENHYDRAMINE HCL 50 MG/ML IJ SOLN
INTRAMUSCULAR | Status: AC
  Filled 2017-08-15: qty 1

## 2017-08-15 MED ORDER — FAMOTIDINE IN NACL 20-0.9 MG/50ML-% IV SOLN
20.0000 mg | Freq: Once | INTRAVENOUS | Status: AC
  Administered 2017-08-15: 20 mg via INTRAVENOUS

## 2017-08-15 MED ORDER — OXYCODONE-ACETAMINOPHEN 5-325 MG PO TABS: 1.0000 | ORAL_TABLET | Freq: Four times a day (QID) | ORAL | 0 refills | Status: DC | PRN

## 2017-08-15 MED ORDER — DIPHENHYDRAMINE HCL 25 MG PO CAPS
ORAL_CAPSULE | ORAL | Status: AC
  Filled 2017-08-15: qty 1

## 2017-08-15 MED ORDER — FAMOTIDINE IN NACL 20-0.9 MG/50ML-% IV SOLN
INTRAVENOUS | Status: AC
  Filled 2017-08-15: qty 50

## 2017-08-15 MED ORDER — SODIUM CHLORIDE 0.9 % IV SOLN
80.0000 mg/m2 | Freq: Once | INTRAVENOUS | Status: AC
  Administered 2017-08-15: 150 mg via INTRAVENOUS
  Filled 2017-08-15: qty 25

## 2017-08-15 MED ORDER — HEPARIN SOD (PORK) LOCK FLUSH 100 UNIT/ML IV SOLN
500.0000 [IU] | Freq: Once | INTRAVENOUS | Status: AC | PRN
  Administered 2017-08-15: 500 [IU]
  Filled 2017-08-15: qty 5

## 2017-08-15 MED ORDER — SODIUM CHLORIDE 0.9 % IJ SOLN
10.0000 mL | INTRAMUSCULAR | Status: DC | PRN
  Administered 2017-08-15: 10 mL via INTRAVENOUS
  Filled 2017-08-15: qty 10

## 2017-08-15 MED ORDER — DIPHENHYDRAMINE HCL 25 MG PO TABS
25.0000 mg | ORAL_TABLET | Freq: Once | ORAL | Status: AC
  Administered 2017-08-15: 25 mg via ORAL
  Filled 2017-08-15: qty 1

## 2017-08-15 MED ORDER — SODIUM CHLORIDE 0.9% FLUSH
10.0000 mL | INTRAVENOUS | Status: DC | PRN
  Administered 2017-08-15: 10 mL
  Filled 2017-08-15: qty 10

## 2017-08-15 MED ORDER — SODIUM CHLORIDE 0.9 % IV SOLN
Freq: Once | INTRAVENOUS | Status: AC
  Administered 2017-08-15: 10:00:00 via INTRAVENOUS

## 2017-08-15 MED ORDER — DEXAMETHASONE SODIUM PHOSPHATE 10 MG/ML IJ SOLN
INTRAMUSCULAR | Status: AC
  Filled 2017-08-15: qty 1

## 2017-08-15 MED ORDER — DEXAMETHASONE SODIUM PHOSPHATE 10 MG/ML IJ SOLN
5.0000 mg | Freq: Once | INTRAMUSCULAR | Status: AC
  Administered 2017-08-15: 5 mg via INTRAVENOUS

## 2017-08-15 MED ORDER — BEVACIZUMAB CHEMO INJECTION 400 MG/16ML
10.5000 mg/kg | Freq: Once | INTRAVENOUS | Status: AC
  Administered 2017-08-15: 900 mg via INTRAVENOUS
  Filled 2017-08-15: qty 36

## 2017-08-15 NOTE — Telephone Encounter (Signed)
Scheduled appt per 2/6 los - Gave patient AVS and calender per los.  

## 2017-08-15 NOTE — Progress Notes (Signed)
[facility] OFFICE PROGRESS NOTE   Diagnosis: Ovarian cancer  INTERVAL HISTORY:   Lynn Ramirez returns as scheduled.  She completed another treatment with Taxol/Avastin 08/01/2017.  She reports stable neuropathy symptoms.  No bleeding or symptom of thrombosis.  Both knees were injected yesterday.  She complains of discomfort in the legs bilaterally.  She has increased lower leg swelling bilaterally. Her chief complaint is a persistent cough and malaise.  No nausea or abdominal pain.  Objective:  Vital signs in last 24 hours:  Blood pressure (!) 144/59, pulse 83, temperature 97.6 F (36.4 C), temperature source Oral, resp. rate 18, height 5\' 1"  (1.549 m), weight 187 lb 4.8 oz (85 kg), SpO2 97 %.    HEENT: No thrush Resp: Distant breath sounds, no respiratory distress Cardio: Regular rate and rhythm GI: No hepatomegaly, no apparent ascites, no mass, mild tenderness in the left abdomen Vascular: Trace lower leg edema bilaterally, no erythema    Portacath/PICC-without erythema  Lab Results:  Lab Results  Component Value Date   WBC 3.8 (L) 08/01/2017   HGB 10.9 (L) 08/01/2017   HCT 36.2 08/01/2017   MCV 96.0 08/01/2017   PLT 369 08/01/2017   NEUTROABS 2.0 08/01/2017    CMP     Component Value Date/Time   NA 137 08/01/2017 1037   NA 137 07/04/2017 1122   K 4.1 08/01/2017 1037   K 4.1 07/04/2017 1122   CL 97 (L) 08/01/2017 1037   CO2 32 (H) 08/01/2017 1037   CO2 30 (H) 07/04/2017 1122   GLUCOSE 109 08/01/2017 1037   GLUCOSE 104 07/04/2017 1122   BUN 15 08/01/2017 1037   BUN 15.8 07/04/2017 1122   CREATININE 0.86 08/01/2017 1037   CREATININE 0.9 07/04/2017 1122   CALCIUM 9.8 08/01/2017 1037   CALCIUM 9.4 07/04/2017 1122   PROT 6.7 08/01/2017 1037   PROT 7.0 07/04/2017 1122   ALBUMIN 3.0 (L) 08/01/2017 1037   ALBUMIN 3.4 (L) 07/04/2017 1122   AST 11 08/01/2017 1037   AST 13 07/04/2017 1122   ALT 10 08/01/2017 1037   ALT 10 07/04/2017 1122   ALKPHOS 74 08/01/2017 1037   ALKPHOS 73 07/04/2017 1122   BILITOT 0.6 08/01/2017 1037   BILITOT 0.49 07/04/2017 1122   GFRNONAA >60 08/01/2017 1037   GFRAA >60 08/01/2017 1037     Medications: I have reviewed the patient's current medications.   Assessment/Plan: 1. 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 Significant improvement in malignant right pleural effusion.  Status post robotic-assisted laparoscopic hysterectomy with bilateral salpingoophorectomy, omentectomy, radical tumor debulking 12/29/2014. Per Dr. Serita Grit office note 01/14/2015 cytoreduction was optimal with residual disease remaining only in the 1 mm implants on the small intestine. Pathology on the omentum showed high-grade serous carcinoma; papillary high-grade serous carcinoma arising from the right fallopian tube; high-grade serous carcinoma involving the right ovary; high-grade serous carcinoma involving paratubal soft tissue of the left fallopian tube; high-grade serous  carcinoma involving left ovary. Cycle 1 adjuvant Taxol/carboplatin 01/20/2015 Cycle 2  adjuvant Taxol/carboplatin 02/10/2015 Cycle 3 adjuvant Taxol/carboplatin 03/10/2015 CA 125 on 1013 2016-42  CTs of the chest, abdomen, and pelvis 05/31/2015 with no evidence of progressive ovarian cancer  CTs of the chest, abdomen, and pelvis 08/07/2015 and 08/08/2015-no evidence of progressive ovarian cancer  Peritoneal studding noted at the time of the cholecystectomy procedure 08/09/2015 Elevated CA 125 10/07/2015  CT 10/07/2015 with stricturing at the sigmoid colon, constipation, and omental nodularity  Initiation of salvage weekly Taxol chemotherapy 10/13/2015  Taxol changed to every 2 weeks beginning 01/19/2016 due to painful neuropathy.  CT scans 07/19/2016 with no acute findings. No features in the abdomen or pelvis to suggest recurrent disease.Stable mild fullness right intrarenal collecting system.  Elevated CA 125 treatment resumed with Taxol/Avastin on a 2 week schedule 09/27/2016  CT 02/12/2017-no evidence of carcinomatosis, no evidence of progressive metastatic disease  Taxol/Avastin continued every 2 weeks 2. COPD-followed by Dr. Lamonte Sakai. 3. Dyspnea secondary to COPD and the large right pleural effusion, status post therapeutic thoracentesis procedures 09/30/2014,10/09/2014, and 10/21/2014. Left thoracentesis 10/30/2014 4. Left nephrectomy as a child 5. Delayed nausea following cycle 1 Taxol/carboplatin, Aloxi/Emend added with cycle 2 with improvement. 6. Right lower extremity edema, right calf pain 12/09/2014. Negative venous Doppler 12/09/2014. 7. Diffuse pruritus following cycle 1 adjuvant Taxol/carboplatin 01/20/2015, no rash, resolved with a steroid dose pack 8. Thrombocytopenia second to chemotherapy, the carboplatin was dose reduced with cycle 2 adjuvant Taxol/carboplatin 02/10/2015 9. Chemotherapy-induced peripheral neuropathy-painful peripheral neuropathy involving the feet 01/19/2016.  10. Admission with acute cholecystitis 08/07/2015, status post a  cholecystectomy 08/09/2015 11. Admission 10/08/2015 with abdominal pain/constipation, improved with bowel rest and laxatives 12. Mild right hydronephrosis noted on the CT 10/07/2015. Renal ultrasound 12/16/2015 with no hydronephrosis noted.No hydronephrosis on CT 07/19/2016.  Disposition: She appears unchanged.  She is symptomatic with a cough and malaise.  I suspect her symptoms are mostly related to COPD and a recent viral upper respiratory infection.  The CA 125 has been slightly higher over the past month, but I doubt progressive ovarian cancer explains her symptoms.  We decided to continue Taxol/Avastin for now.  She will be scheduled for a restaging CT evaluation if there is a consistent rise in the CA 125.  Lynn Ramirez will return for an office visit and chemotherapy in 2 weeks.  15 minutes were spent with the patient today.  The majority of the time was used for counseling and coordination of care.  Betsy Coder, MD  08/15/2017  8:43 AM

## 2017-08-15 NOTE — Patient Instructions (Signed)
[facility] Discharge Instructions for Patients Receiving Chemotherapy  Today you received the following chemotherapy agents Avastin and Taxol  To help prevent nausea and vomiting after your treatment, we encourage you to take your nausea medication as directed   If you develop nausea and vomiting that is not controlled by your nausea medication, call the clinic.   BELOW ARE SYMPTOMS THAT SHOULD BE REPORTED IMMEDIATELY:  *FEVER GREATER THAN 100.5 F  *CHILLS WITH OR WITHOUT FEVER  NAUSEA AND VOMITING THAT IS NOT CONTROLLED WITH YOUR NAUSEA MEDICATION  *UNUSUAL SHORTNESS OF BREATH  *UNUSUAL BRUISING OR BLEEDING  TENDERNESS IN MOUTH AND THROAT WITH OR WITHOUT PRESENCE OF ULCERS  *URINARY PROBLEMS  *BOWEL PROBLEMS  UNUSUAL RASH Items with * indicate a potential emergency and should be followed up as soon as possible.  Feel free to call the clinic should you have any questions or concerns. The clinic phone number is [phone].  Please show the CHEMO ALERT CARD at check-in to the Emergency Department and triage nurse.   

## 2017-08-16 ENCOUNTER — Telehealth: Payer: Self-pay | Admitting: *Deleted

## 2017-08-16 LAB — CA 125: Cancer Antigen (CA) 125: 672.9 U/mL — ABNORMAL HIGH (ref 0.0–38.1)

## 2017-08-16 NOTE — Telephone Encounter (Signed)
Call from pt, she is concerned about elevated CA125. Asks if treatment will be changed.

## 2017-08-16 NOTE — Telephone Encounter (Signed)
Returned call to pt, informed her Dr. Benay Spice recommends continuing current treatment. Will order scans if CA 125 jumps dramatically or if she develops clear signs of progression. Pt voiced understanding.

## 2017-08-21 DIAGNOSIS — M170 Bilateral primary osteoarthritis of knee: Secondary | ICD-10-CM | POA: Diagnosis not present

## 2017-08-22 ENCOUNTER — Other Ambulatory Visit: Payer: Self-pay | Admitting: Cardiology

## 2017-08-22 DIAGNOSIS — E785 Hyperlipidemia, unspecified: Secondary | ICD-10-CM

## 2017-08-22 DIAGNOSIS — I25119 Atherosclerotic heart disease of native coronary artery with unspecified angina pectoris: Secondary | ICD-10-CM

## 2017-08-26 ENCOUNTER — Other Ambulatory Visit: Payer: Self-pay | Admitting: Oncology

## 2017-08-29 ENCOUNTER — Encounter: Payer: Self-pay | Admitting: Nurse Practitioner

## 2017-08-29 ENCOUNTER — Telehealth: Payer: Self-pay | Admitting: Oncology

## 2017-08-29 ENCOUNTER — Inpatient Hospital Stay (HOSPITAL_BASED_OUTPATIENT_CLINIC_OR_DEPARTMENT_OTHER): Payer: Medicare Other | Admitting: Nurse Practitioner

## 2017-08-29 ENCOUNTER — Inpatient Hospital Stay: Payer: Medicare Other

## 2017-08-29 ENCOUNTER — Ambulatory Visit (HOSPITAL_BASED_OUTPATIENT_CLINIC_OR_DEPARTMENT_OTHER): Payer: Medicare Other | Admitting: Medical

## 2017-08-29 VITALS — BP 129/65 | HR 88 | Temp 97.6°F | Resp 18 | Ht 61.0 in | Wt 185.3 lb

## 2017-08-29 VITALS — BP 133/97 | HR 80 | Temp 97.8°F | Resp 16

## 2017-08-29 DIAGNOSIS — C482 Malignant neoplasm of peritoneum, unspecified: Secondary | ICD-10-CM

## 2017-08-29 DIAGNOSIS — J449 Chronic obstructive pulmonary disease, unspecified: Secondary | ICD-10-CM | POA: Diagnosis not present

## 2017-08-29 DIAGNOSIS — C786 Secondary malignant neoplasm of retroperitoneum and peritoneum: Secondary | ICD-10-CM | POA: Diagnosis not present

## 2017-08-29 DIAGNOSIS — C562 Malignant neoplasm of left ovary: Secondary | ICD-10-CM | POA: Diagnosis not present

## 2017-08-29 DIAGNOSIS — C569 Malignant neoplasm of unspecified ovary: Secondary | ICD-10-CM

## 2017-08-29 DIAGNOSIS — Z5111 Encounter for antineoplastic chemotherapy: Secondary | ICD-10-CM | POA: Diagnosis not present

## 2017-08-29 DIAGNOSIS — Z79899 Other long term (current) drug therapy: Secondary | ICD-10-CM

## 2017-08-29 DIAGNOSIS — Z95828 Presence of other vascular implants and grafts: Secondary | ICD-10-CM

## 2017-08-29 DIAGNOSIS — C561 Malignant neoplasm of right ovary: Secondary | ICD-10-CM

## 2017-08-29 DIAGNOSIS — K5900 Constipation, unspecified: Secondary | ICD-10-CM | POA: Diagnosis not present

## 2017-08-29 DIAGNOSIS — T8090XA Unspecified complication following infusion and therapeutic injection, initial encounter: Secondary | ICD-10-CM

## 2017-08-29 LAB — COMPREHENSIVE METABOLIC PANEL
ALBUMIN: 3.1 g/dL — AB (ref 3.5–5.0)
ALK PHOS: 69 U/L (ref 40–150)
ALT: 9 U/L (ref 0–55)
AST: 12 U/L (ref 5–34)
Anion gap: 9 (ref 3–11)
BILIRUBIN TOTAL: 0.5 mg/dL (ref 0.2–1.2)
BUN: 20 mg/dL (ref 7–26)
CALCIUM: 10 mg/dL (ref 8.4–10.4)
CO2: 29 mmol/L (ref 22–29)
CREATININE: 0.97 mg/dL (ref 0.60–1.10)
Chloride: 101 mmol/L (ref 98–109)
GFR calc Af Amer: >60 mL/min (ref >60)
GFR calc non Af Amer: 56 mL/min — ABNORMAL LOW (ref >60)
GLUCOSE: 109 mg/dL (ref 70–140)
Potassium: 4 mmol/L (ref 3.5–5.1)
SODIUM: 139 mmol/L (ref 136–145)
Total Protein: 6.5 g/dL (ref 6.4–8.3)

## 2017-08-29 LAB — CBC WITH DIFFERENTIAL/PLATELET
Basophils Absolute: 0 10*3/uL (ref 0.0–0.1)
Basophils Relative: 1 %
EOS ABS: 0.3 10*3/uL (ref 0.0–0.5)
EOS PCT: 8 %
HEMATOCRIT: 37.9 % (ref 34.8–46.6)
Hemoglobin: 11.2 g/dL — ABNORMAL LOW (ref 11.6–15.9)
Lymphocytes Relative: 29 %
Lymphs Abs: 0.9 10*3/uL (ref 0.9–3.3)
MCH: 28.2 pg (ref 25.1–34.0)
MCHC: 29.6 g/dL — AB (ref 31.5–36.0)
MCV: 95.5 fL (ref 79.5–101.0)
MONO ABS: 0.5 10*3/uL (ref 0.1–0.9)
MONOS PCT: 15 %
NEUTROS ABS: 1.6 10*3/uL (ref 1.5–6.5)
Neutrophils Relative %: 49 %
PLATELETS: 291 10*3/uL (ref 145–400)
RBC: 3.97 MIL/uL (ref 3.70–5.45)
RDW: 18.2 % — AB (ref 11.2–14.5)
WBC: 3.3 10*3/uL — ABNORMAL LOW (ref 3.9–10.3)

## 2017-08-29 MED ORDER — DIPHENHYDRAMINE HCL 25 MG PO TABS
25.0000 mg | ORAL_TABLET | Freq: Once | ORAL | Status: AC
  Administered 2017-08-29: 25 mg via ORAL
  Filled 2017-08-29: qty 1

## 2017-08-29 MED ORDER — SODIUM CHLORIDE 0.9 % IV SOLN
10.5000 mg/kg | Freq: Once | INTRAVENOUS | Status: AC
  Administered 2017-08-29: 900 mg via INTRAVENOUS
  Filled 2017-08-29: qty 4

## 2017-08-29 MED ORDER — HEPARIN SOD (PORK) LOCK FLUSH 100 UNIT/ML IV SOLN
500.0000 [IU] | Freq: Once | INTRAVENOUS | Status: AC | PRN
  Administered 2017-08-29: 500 [IU]
  Filled 2017-08-29: qty 5

## 2017-08-29 MED ORDER — SODIUM CHLORIDE 0.9 % IJ SOLN
10.0000 mL | INTRAMUSCULAR | Status: DC | PRN
  Administered 2017-08-29: 10 mL via INTRAVENOUS
  Filled 2017-08-29: qty 10

## 2017-08-29 MED ORDER — SODIUM CHLORIDE 0.9 % IV SOLN
Freq: Once | INTRAVENOUS | Status: AC
  Administered 2017-08-29: 12:00:00 via INTRAVENOUS

## 2017-08-29 MED ORDER — DEXAMETHASONE SODIUM PHOSPHATE 10 MG/ML IJ SOLN
INTRAMUSCULAR | Status: AC
  Filled 2017-08-29: qty 1

## 2017-08-29 MED ORDER — DEXAMETHASONE SODIUM PHOSPHATE 10 MG/ML IJ SOLN
5.0000 mg | Freq: Once | INTRAMUSCULAR | Status: AC
  Administered 2017-08-29: 5 mg via INTRAVENOUS

## 2017-08-29 MED ORDER — FAMOTIDINE IN NACL 20-0.9 MG/50ML-% IV SOLN: 20.0000 mg | Freq: Once | INTRAVENOUS | Status: DC

## 2017-08-29 MED ORDER — DIPHENHYDRAMINE HCL 25 MG PO CAPS
ORAL_CAPSULE | ORAL | Status: AC
  Filled 2017-08-29: qty 1

## 2017-08-29 MED ORDER — FAMOTIDINE IN NACL 20-0.9 MG/50ML-% IV SOLN
20.0000 mg | Freq: Once | INTRAVENOUS | Status: AC | PRN
  Administered 2017-08-29: 20 mg via INTRAVENOUS
  Filled 2017-08-29: qty 50

## 2017-08-29 MED ORDER — SODIUM CHLORIDE 0.9 % IV SOLN
20.0000 mg | Freq: Once | INTRAVENOUS | Status: AC
  Administered 2017-08-29: 20 mg via INTRAVENOUS
  Filled 2017-08-29: qty 2

## 2017-08-29 MED ORDER — SODIUM CHLORIDE 0.9% FLUSH
10.0000 mL | INTRAVENOUS | Status: DC | PRN
  Administered 2017-08-29: 10 mL
  Filled 2017-08-29: qty 10

## 2017-08-29 MED ORDER — SODIUM CHLORIDE 0.9 % IV SOLN
80.0000 mg/m2 | Freq: Once | INTRAVENOUS | Status: AC
  Administered 2017-08-29: 150 mg via INTRAVENOUS
  Filled 2017-08-29: qty 25

## 2017-08-29 NOTE — Progress Notes (Signed)
Patient presented at 1338 with tingling, numbness, flushing to face, and SOB.  Taxol stopped and normal saline started per hypersensitivity protocol.  Sandi Mealy, PA-C notified and present in infusion room.  VS obtained per flowsheets.   Pepcid administered.  All patient's symptoms resolved.  Taxol restarted at half rate at 1358, patient able to complete treatment.

## 2017-08-29 NOTE — Patient Instructions (Signed)
[facility] Discharge Instructions for Patients Receiving Chemotherapy  Today you received the following chemotherapy agents Taxol and Avastin  To help prevent nausea and vomiting after your treatment, we encourage you to take your nausea medication as directed.    If you develop nausea and vomiting that is not controlled by your nausea medication, call the clinic.   BELOW ARE SYMPTOMS THAT SHOULD BE REPORTED IMMEDIATELY:  *FEVER GREATER THAN 100.5 F  *CHILLS WITH OR WITHOUT FEVER  NAUSEA AND VOMITING THAT IS NOT CONTROLLED WITH YOUR NAUSEA MEDICATION  *UNUSUAL SHORTNESS OF BREATH  *UNUSUAL BRUISING OR BLEEDING  TENDERNESS IN MOUTH AND THROAT WITH OR WITHOUT PRESENCE OF ULCERS  *URINARY PROBLEMS  *BOWEL PROBLEMS  UNUSUAL RASH Items with * indicate a potential emergency and should be followed up as soon as possible.  Feel free to call the clinic should you have any questions or concerns. The clinic phone number is [phone].  Please show the [place] at check-in to the Emergency Department and triage nurse.

## 2017-08-29 NOTE — Progress Notes (Addendum)
[facility] OFFICE PROGRESS NOTE   Diagnosis: Ovarian cancer  INTERVAL HISTORY:   Lynn Ramirez returns as scheduled.  She completed another treatment with Taxol/Avastin 08/15/2017.  She denies nausea/vomiting.  No mouth sores.  No diarrhea.  Stable neuropathy symptoms in the fingertips and feet.  No bleeding except with nose blowing.  She has intermittent dyspnea.  Over the past week she has noted pain at the right low abdomen.  Objective:  Vital signs in last 24 hours:  Blood pressure 129/65, pulse 88, temperature 97.6 F (36.4 C), temperature source Oral, resp. rate 18, height 5\' 1"  (1.549 m), weight 185 lb 4.8 oz (84.1 kg), SpO2 96 %.    HEENT: No thrush or ulcers. Resp: Distant breath sounds.  No respiratory distress. Cardio: Regular rate and rhythm. GI: Abdomen is soft.  She is tender at the right upper abdomen and right greater than left lower abdomen.  No hepatomegaly.  No mass. Vascular: No leg edema.  Port-A-Cath without erythema.  Lab Results:  Lab Results  Component Value Date   WBC 3.3 (L) 08/29/2017   HGB 11.2 (L) 08/29/2017   HCT 37.9 08/29/2017   MCV 95.5 08/29/2017   PLT 291 08/29/2017   NEUTROABS 1.6 08/29/2017    Imaging:  No results found.  Medications: I have reviewed the patient's current medications.  Assessment/Plan: 1. 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, immunohistochemical profile consistent with a GYN primary, elevated CA 125   Staging CTs of the chest, abdomen, and pelvis on 10/06/2014 revealed a loculated right pleural effusion, ascites, and omental/mesenteric thickening   Cytology from peritoneal fluid 10/13/2014 revealed malignant cells consistent with metastatic adenocarcinoma Biopsy of an omental mass on 11/02/2014 revealed invasive  serous carcinoma with psammoma bodies Cycle 1 Taxol/carboplatin 10/28/2014 Cycle 2 Taxol/carboplatin 11/18/2014 Cycle 3 Taxol/carboplatin 12/09/2014  CT scan 12/23/2014 with interval improvement in peritoneal carcinomatosis with near-complete resolution of ascites and decreased omental nodularity. Significant improvement in malignant right pleural effusion.  Status post robotic-assisted laparoscopic hysterectomy with bilateral salpingoophorectomy, omentectomy, radical tumor debulking 12/29/2014. Per Dr. Serita Grit office note 01/14/2015 cytoreduction was optimal with residual disease remaining only in the 1 mm implants on the small intestine. 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 Cycle 1 adjuvant Taxol/carboplatin 01/20/2015 Cycle 2 adjuvant Taxol/carboplatin 02/10/2015 Cycle 3 adjuvant Taxol/carboplatin 03/10/2015 CA 125 on 1013 2016-42  CTs of the chest, abdomen, and pelvis 05/31/2015 with no evidence of progressive ovarian cancer  CTs of the chest, abdomen, and pelvis 08/07/2015 and 08/08/2015-no evidence of progressive ovarian cancer  Peritoneal studding noted at the time of the cholecystectomy procedure 08/09/2015 Elevated CA 125 10/07/2015  CT 10/07/2015 with stricturing at the sigmoid colon, constipation, and omental nodularity  Initiation of salvage weekly Taxol chemotherapy 10/13/2015  Taxol changed to every 2 weeks beginning 01/19/2016 due to painful neuropathy.  CT scans 07/19/2016 with no acute findings. No features in the abdomen or pelvis to suggest recurrent disease.Stable mild fullness right intrarenal collecting system.  Elevated CA 125 treatment resumed with Taxol/Avastin on a 2 week  schedule 09/27/2016  CT 02/12/2017-no evidence of carcinomatosis, no evidence of progressive metastatic disease  Taxol/Avastin continued every 2 weeks 2. COPD-followed by Dr. Lamonte Sakai. 3. Dyspnea secondary to COPD and the large right pleural effusion, status  post therapeutic thoracentesis procedures 09/30/2014,10/09/2014, and 10/21/2014. Left thoracentesis 10/30/2014 4. Left nephrectomy as a child 5. Delayed nausea following cycle 1 Taxol/carboplatin, Aloxi/Emend added with cycle 2 with improvement. 6. Right lower extremity edema, right calf pain 12/09/2014. Negative venous Doppler 12/09/2014. 7. Diffuse pruritus following cycle 1 adjuvant Taxol/carboplatin 01/20/2015, no rash, resolved with a steroid dose pack 8. Thrombocytopenia second to chemotherapy, the carboplatin was dose reduced with cycle 2 adjuvant Taxol/carboplatin 02/10/2015 9. Chemotherapy-induced peripheral neuropathy-painful peripheral neuropathy involving the feet 01/19/2016.  10. Admission with acute cholecystitis 08/07/2015, status post a cholecystectomy 08/09/2015 11. Admission 10/08/2015 with abdominal pain/constipation, improved with bowel rest and laxatives 12. Mild right hydronephrosis noted on the CT 10/07/2015. Renal ultrasound 12/16/2015 with no hydronephrosis noted.No hydronephrosis on CT 07/19/2016.     Disposition: Lynn Ramirez appears unchanged.  Plan to proceed with Taxol/Avastin today as scheduled.  We will follow-up on the CA 125 from today.  We are referring her for restaging CTs prior to her next visit.  She will return for a follow-up visit and Taxol/Avastin in 2 weeks.  She will contact the office in the interim with any problems.  Patient seen with Dr. Benay Spice.    Ned Card ANP/GNP-BC   08/29/2017  10:05 AM  This was a shared visit with Ned Card.  Lynn Ramirez continues treatment with Taxol/Avastin.  The CA 125 has been slightly higher over the past few months.  She will be referred for  restaging CT scans prior to an office visit in 2 weeks.  Julieanne Manson, MD

## 2017-08-29 NOTE — Telephone Encounter (Signed)
Scheduled appt per 2/20 los - Appt per 2/20 los - Gave patient AVS and calender per los.

## 2017-08-30 ENCOUNTER — Telehealth: Payer: Self-pay

## 2017-08-30 LAB — CA 125: CANCER ANTIGEN (CA) 125: 607.2 U/mL — AB (ref 0.0–38.1)

## 2017-08-30 NOTE — Telephone Encounter (Signed)
Spoke with patient to inform her that CA 125 results were lower.  She asked for results and given by nurse.  Patient happy with results and expressed thanks several times for the call. She knows to call center with any issues.

## 2017-08-30 NOTE — Telephone Encounter (Signed)
-----   Message from [name], NP sent at 08/30/2017  9:07 AM EST ----- Please let her know the CA 125 was lower.

## 2017-08-30 NOTE — Progress Notes (Signed)
[facility] Progress Note   Lynn Ramirez [id number] Jul 19, 1940 77 y.o.  Gerry Heaphy is managed by Dr. Sharlyne Pacas presents for:  Chemotherapy:  yes       Monoclonal Antibody: yes      Immunoglobulin: no      Bisphosphonate: no       Transfusion:  no     Current Therapy: Bevacizumab and paclitaxel  Lynn Ramirez was receiving paclitaxel at the time of her reaction.  First dose of paclitaxel: no  Last Treated: 02 / 06 / 2019  Assessment: Plan:    Infusion reaction, initial encounter  Lynn Ramirez was seen in the infusion room for a suspected infusion reaction. She was receiving paclitaxel at the time of her reaction. She had received a total of one half of her total infusion prior to onset of symptoms. Her symptoms included: Facial tingling, facial flushing, mild shortness of breath, and a headache. She was premedicated with dexamethasone 5 mg IV and Benadryl 25 mg p.o. to starting her infusion.   Paclitaxel was paused and Lynn Ramirez was given Pepcid 20 mg IV after onset of her symptoms. Lynn Ramirez did  respond to intervention.  She was able to complete the remainder of her paclitaxel infusion without any further issues of concern.  Please see After Visit Summary for patient specific instructions.  Future Appointments  Date Time Provider [place]  09/12/2017  9:15 AM [facility] LAB 5 [facility] None  09/12/2017  9:30 AM [facility] FLUSH NURSE [facility] None  09/12/2017 10:00 AM Lynn Pier, MD [facility] None  09/12/2017 11:00 AM [facility] F20 [facility] None  09/26/2017  9:15 AM [facility] LAB 5 [facility] None  09/26/2017  9:30 AM [facility] J32 DNS [facility] None  09/26/2017 10:00 AM Lynn Pier, MD [facility] None  09/26/2017 11:00 AM [facility] PROCEDURE 1 [facility] None  10/09/2017 10:30 AM [facility] 3 [facility]  10/15/2017 11:45 AM Byrum, Rose Fillers, MD  [facility] None  10/16/2017 10:20 AM Dorothy Spark, MD [facility]  01/23/2018  2:00 PM [facility]  01/23/2018  3:00 PM Midge Minium, MD [facility]    No orders of the defined types were placed in this encounter.      Subjective:   Patient ID:  Lynn Ramirez is a 77 y.o. (DOB 07-18-1940) female.  Chief Complaint: No chief complaint on file.   HPI Lynn Ramirez was seen in the infusion room for a suspected infusion reaction. She was receiving paclitaxel at the time of her reaction. She had received a total of one half of her total infusion prior to onset of symptoms. Her symptoms included: Facial tingling, facial flushing, mild shortness of breath, and a headache. She was premedicated with dexamethasone 5 mg IV and Benadryl 25 mg p.o. to starting her infusion.  Paclitaxel was paused and Lynn Ramirez was given Pepcid 20 mg IV after onset of her symptoms. Lynn Ramirez did  respond to intervention.  She was able to complete the remainder of her paclitaxel infusion without any further issues of concern.   Medications: I have reviewed the patient's current medications.  Allergies:  Allergies  Allergen Reactions  . Latex Rash    Latex gloves ONLY (pt cannot wear them- no reaction if someone else touches her with latex gloves on)  . Penicillins Other (See Comments)    Has patient had a PCN reaction causing immediate rash, facial/tongue/throat swelling, SOB or lightheadedness with hypotension:  Yes  Has patient had a PCN reaction causing severe rash involving mucus membranes or skin necrosis:No Has patient had a PCN reaction that required hospitalization:No Has patient had a PCN reaction occurring within the last 10 years:No If all of the above answers are "NO", then may proceed with Cephalosporin use. welps    Past Medical History:  Diagnosis Date  . CKD (chronic kidney disease), stage II   . COPD (chronic obstructive  pulmonary disease) ([place])   . Coronary artery calcification seen on CT scan   . Difficulty sleeping   . Eczema    hands  . Emphysema   . GERD (gastroesophageal reflux disease)   . H/O hydronephrosis   . History of transfusion    age 77  . Hyperlipidemia   . Hypertension   . Ovarian cancer ([place]) dx'd 09/2014   metastatic - prior malignant R pleural effusion and peritoneal carcinomatosis  . S/p nephrectomy     Past Surgical History:  Procedure Laterality Date  . ABDOMINAL HYSTERECTOMY    . CHOLECYSTECTOMY N/A 08/09/2015   Procedure: Attempted LAPAROSCOPIC coverted  open CHOLECYSTECTOMY WITH INTRAOPERATIVE CHOLANGIOGRAM;  Surgeon: Autumn Messing III, MD;  Location: [facility];  Service: General;  Laterality: N/A;  . HAND SURGERY Right 1980's  . LAPAROTOMY N/A 12/29/2014   Procedure:  LAPAROTOMY;  Surgeon: Everitt Amber, MD;  Location: [facility];  Service: Gynecology;  Laterality: N/A;  . NEPHRECTOMY    . ROBOTIC ASSISTED TOTAL HYSTERECTOMY WITH BILATERAL SALPINGO OOPHERECTOMY Bilateral 12/29/2014   Procedure: ROBOTIC ASSISTED TOTAL LAPAROSCOPIC HYSTERECTOMY WITH BILATERAL SALPINGO OOPHORECTOMY AND OOMENTECTOMY WITH RADICAL TUMOR [place] ;  Surgeon: Everitt Amber, MD;  Location: [facility];  Service: Gynecology;  Laterality: Bilateral;  . THORACENTESIS     several  . TONSILLECTOMY    . TUBAL LIGATION      Family History  Problem Relation Age of Onset  . Diabetes Father   . Lung cancer Father 88       metastasis to liver  . Cancer Father        liver  . Pancreatic cancer Mother 56  . Stroke Paternal Grandfather   . Heart disease Maternal Aunt   . Diabetes Paternal Uncle   . Heart Problems Maternal Grandmother   . Heart Problems Maternal Grandfather   . Diabetes Paternal Grandmother   . Stroke Paternal Grandmother   . Heart Problems Paternal Uncle   . Cancer Cousin        unknown type  . Breast cancer Cousin        dx. 38s  . Leukemia Cousin        dx. 16-17  . Colon cancer Neg Hx   .  Stomach cancer Neg Hx     Social History   Socioeconomic History  . Marital status: Married    Spouse name: Not on file  . Number of children: 3  . Years of education: Not on file  . Highest education level: Not on file  Social Needs  . Financial resource strain: Not on file  . Food insecurity - worry: Not on file  . Food insecurity - inability: Not on file  . Transportation needs - medical: Not on file  . Transportation needs - non-medical: Not on file  Occupational History  . Occupation: RETIRED    Employer: RETIRED  Tobacco Use  . Smoking status: Former Smoker    Packs/day: 1.00    Years: 40.00    Pack years: 40.00    Types: Cigarettes  Last attempt to quit: 07/10/2013    Years since quitting: 4.1  . Smokeless tobacco: Never Used  Substance and Sexual Activity  . Alcohol use: Yes    Alcohol/week: 0.0 oz    Comment: 1-2 a week  . Drug use: No  . Sexual activity: Not on file  Other Topics Concern  . Not on file  Social History Narrative  . Not on file    Past Medical History, Surgical history, Social history, and Family history were reviewed and updated as appropriate.   Please see review of systems for further details on the patient's review from today.   Review of Systems:  Review of Systems  Constitutional: Negative for chills, diaphoresis and fever.  HENT: Negative for trouble swallowing.   Respiratory: Positive for shortness of breath. Negative for cough, chest tightness and wheezing.   Cardiovascular: Negative for chest pain and palpitations.  Gastrointestinal: Negative for diarrhea and vomiting.  Musculoskeletal: Negative for back pain.  Skin:       Facial flushing and facial tingling  Neurological: Positive for headaches.    Objective:   Physical Exam:  There were no vitals taken for this visit.  Physical Exam  Constitutional: No distress.  HENT:  Head: Normocephalic and atraumatic.  Patient was noted to have flushing of her cheeks which  resolved after dosing with IV Pepcid.  Cardiovascular: Normal rate, regular rhythm and normal heart sounds. Exam reveals no gallop and no friction rub.  No murmur heard. Pulmonary/Chest: Effort normal and breath sounds normal. No respiratory distress. She has no wheezes. She has no rales.  Neurological: She is alert.  Skin: Skin is warm and dry. She is not diaphoretic.  Flushing of the cheeks with resolution after dosing with IV Pepcid.  Psychiatric: She has a normal mood and affect. Her behavior is normal. Judgment and thought content normal.    Lab Review:     Component Value Date/Time   NA 139 08/29/2017 0854   NA 137 07/04/2017 1122   K 4.0 08/29/2017 0854   K 4.1 07/04/2017 1122   CL 101 08/29/2017 0854   CO2 29 08/29/2017 0854   CO2 30 (H) 07/04/2017 1122   GLUCOSE 109 08/29/2017 0854   GLUCOSE 104 07/04/2017 1122   BUN 20 08/29/2017 0854   BUN 15.8 07/04/2017 1122   CREATININE 0.97 08/29/2017 0854   CREATININE 0.91 08/15/2017 0758   CREATININE 0.9 07/04/2017 1122   CALCIUM 10.0 08/29/2017 0854   CALCIUM 9.4 07/04/2017 1122   PROT 6.5 08/29/2017 0854   PROT 7.0 07/04/2017 1122   ALBUMIN 3.1 (L) 08/29/2017 0854   ALBUMIN 3.4 (L) 07/04/2017 1122   AST 12 08/29/2017 0854   AST 11 08/15/2017 0758   AST 13 07/04/2017 1122   ALT 9 08/29/2017 0854   ALT 7 08/15/2017 0758   ALT 10 07/04/2017 1122   ALKPHOS 69 08/29/2017 0854   ALKPHOS 73 07/04/2017 1122   BILITOT 0.5 08/29/2017 0854   BILITOT 0.4 08/15/2017 0758   BILITOT 0.49 07/04/2017 1122   GFRNONAA 56 (L) 08/29/2017 0854   GFRNONAA >60 08/15/2017 0758   GFRAA >60 08/29/2017 0854   GFRAA >60 08/15/2017 0758       Component Value Date/Time   WBC 3.3 (L) 08/29/2017 0854   RBC 3.97 08/29/2017 0854   HGB 11.2 (L) 08/29/2017 0854   HGB 11.5 (L) 07/04/2017 1122   HCT 37.9 08/29/2017 0854   HCT 35.7 07/04/2017 1122   PLT 291 08/29/2017 0854  PLT 317 08/15/2017 0758   PLT 298 07/04/2017 1122   MCV 95.5  08/29/2017 0854   MCV 92.2 07/04/2017 1122   MCH 28.2 08/29/2017 0854   MCHC 29.6 (L) 08/29/2017 0854   RDW 18.2 (H) 08/29/2017 0854   RDW 19.8 (H) 07/04/2017 1122   LYMPHSABS 0.9 08/29/2017 0854   LYMPHSABS 1.1 07/04/2017 1122   MONOABS 0.5 08/29/2017 0854   MONOABS 0.6 07/04/2017 1122   EOSABS 0.3 08/29/2017 0854   EOSABS 0.2 07/04/2017 1122   BASOSABS 0.0 08/29/2017 0854   BASOSABS 0.0 07/04/2017 1122   -------------------------------  Imaging from last 24 hours (if applicable):  Radiology interpretation: Dg Chest 2 View  Result Date: 08/01/2017 CLINICAL DATA:  Cough. History of malignant neoplasm of fallopian tube. EXAM: CHEST  2 VIEW COMPARISON:  02/08/2017 FINDINGS: Right jugular Port-A-Cath with tip in the SVC. Heart size is normal and stable. Band-like density in the left lower chest is suggestive for atelectasis. Prominent interstitial lung markings are chronic. Old fracture in the proximal right humerus. Atherosclerotic calcifications at the aortic arch. IMPRESSION: Atelectasis in left lower chest.  No other focal lung disease. Aortic atherosclerosis. Electronically Signed   By: Markus Daft M.D.   On: 08/01/2017 14:57

## 2017-09-05 ENCOUNTER — Other Ambulatory Visit: Payer: Self-pay | Admitting: Family Medicine

## 2017-09-05 NOTE — Telephone Encounter (Signed)
Last OV 08/03/17 virtussin last filled 08/15/17 Per reconciled meds. Not sure who initially prescried.

## 2017-09-07 ENCOUNTER — Other Ambulatory Visit: Payer: Self-pay | Admitting: Oncology

## 2017-09-07 DIAGNOSIS — C482 Malignant neoplasm of peritoneum, unspecified: Secondary | ICD-10-CM

## 2017-09-08 ENCOUNTER — Other Ambulatory Visit: Payer: Self-pay | Admitting: Oncology

## 2017-09-10 ENCOUNTER — Ambulatory Visit (HOSPITAL_COMMUNITY)
Admission: RE | Admit: 2017-09-10 | Discharge: 2017-09-10 | Disposition: A | Discharge status: Home / Self Care | Payer: Medicare Other | Source: Ambulatory Visit | Attending: Nurse Practitioner | Admitting: Nurse Practitioner

## 2017-09-10 ENCOUNTER — Encounter (HOSPITAL_COMMUNITY): Payer: Self-pay

## 2017-09-10 DIAGNOSIS — Z8543 Personal history of malignant neoplasm of ovary: Secondary | ICD-10-CM | POA: Insufficient documentation

## 2017-09-10 DIAGNOSIS — K5730 Diverticulosis of large intestine without perforation or abscess without bleeding: Secondary | ICD-10-CM | POA: Diagnosis not present

## 2017-09-10 DIAGNOSIS — Z8589 Personal history of malignant neoplasm of other organs and systems: Secondary | ICD-10-CM | POA: Insufficient documentation

## 2017-09-10 DIAGNOSIS — Z5111 Encounter for antineoplastic chemotherapy: Secondary | ICD-10-CM | POA: Diagnosis not present

## 2017-09-10 DIAGNOSIS — R188 Other ascites: Secondary | ICD-10-CM | POA: Insufficient documentation

## 2017-09-10 DIAGNOSIS — R600 Localized edema: Secondary | ICD-10-CM | POA: Diagnosis not present

## 2017-09-10 DIAGNOSIS — N3289 Other specified disorders of bladder: Secondary | ICD-10-CM | POA: Insufficient documentation

## 2017-09-10 DIAGNOSIS — R918 Other nonspecific abnormal finding of lung field: Secondary | ICD-10-CM | POA: Insufficient documentation

## 2017-09-10 DIAGNOSIS — C569 Malignant neoplasm of unspecified ovary: Secondary | ICD-10-CM | POA: Diagnosis not present

## 2017-09-10 DIAGNOSIS — C482 Malignant neoplasm of peritoneum, unspecified: Secondary | ICD-10-CM | POA: Diagnosis present

## 2017-09-10 MED ORDER — IOPAMIDOL (ISOVUE-300) INJECTION 61%
30.0000 mL | Freq: Once | INTRAVENOUS | Status: AC | PRN
  Administered 2017-09-10: 30 mL via ORAL

## 2017-09-10 MED ORDER — IOPAMIDOL (ISOVUE-300) INJECTION 61%
100.0000 mL | Freq: Once | INTRAVENOUS | Status: AC | PRN
  Administered 2017-09-10: 100 mL via INTRAVENOUS

## 2017-09-10 MED ORDER — IOPAMIDOL (ISOVUE-300) INJECTION 61%
INTRAVENOUS | Status: AC
  Filled 2017-09-10: qty 100

## 2017-09-10 MED ORDER — SODIUM CHLORIDE 0.9 % IJ SOLN
INTRAMUSCULAR | Status: DC
Start: 2017-09-10 — End: 2017-09-11
  Filled 2017-09-10: qty 50

## 2017-09-10 MED ORDER — IOPAMIDOL (ISOVUE-300) INJECTION 61%
INTRAVENOUS | Status: AC
  Administered 2017-09-10: 30 mL via ORAL
  Filled 2017-09-10: qty 30

## 2017-09-11 ENCOUNTER — Other Ambulatory Visit: Payer: Self-pay | Admitting: Emergency Medicine

## 2017-09-12 ENCOUNTER — Inpatient Hospital Stay: Payer: Medicare Other

## 2017-09-12 ENCOUNTER — Telehealth: Payer: Self-pay

## 2017-09-12 ENCOUNTER — Inpatient Hospital Stay (HOSPITAL_BASED_OUTPATIENT_CLINIC_OR_DEPARTMENT_OTHER): Payer: Medicare Other | Admitting: Oncology

## 2017-09-12 ENCOUNTER — Inpatient Hospital Stay: Payer: Medicare Other | Attending: Nurse Practitioner

## 2017-09-12 VITALS — BP 166/71 | HR 83 | Temp 98.4°F | Resp 19 | Ht 61.0 in | Wt 186.9 lb

## 2017-09-12 VITALS — BP 116/52 | HR 83

## 2017-09-12 DIAGNOSIS — C5700 Malignant neoplasm of unspecified fallopian tube: Secondary | ICD-10-CM

## 2017-09-12 DIAGNOSIS — R188 Other ascites: Secondary | ICD-10-CM | POA: Insufficient documentation

## 2017-09-12 DIAGNOSIS — K5900 Constipation, unspecified: Secondary | ICD-10-CM | POA: Diagnosis not present

## 2017-09-12 DIAGNOSIS — Z905 Acquired absence of kidney: Secondary | ICD-10-CM | POA: Insufficient documentation

## 2017-09-12 DIAGNOSIS — C562 Malignant neoplasm of left ovary: Secondary | ICD-10-CM | POA: Diagnosis not present

## 2017-09-12 DIAGNOSIS — N1330 Unspecified hydronephrosis: Secondary | ICD-10-CM | POA: Insufficient documentation

## 2017-09-12 DIAGNOSIS — Z79899 Other long term (current) drug therapy: Secondary | ICD-10-CM | POA: Diagnosis not present

## 2017-09-12 DIAGNOSIS — C786 Secondary malignant neoplasm of retroperitoneum and peritoneum: Secondary | ICD-10-CM | POA: Diagnosis not present

## 2017-09-12 DIAGNOSIS — C561 Malignant neoplasm of right ovary: Secondary | ICD-10-CM

## 2017-09-12 DIAGNOSIS — C569 Malignant neoplasm of unspecified ovary: Secondary | ICD-10-CM

## 2017-09-12 DIAGNOSIS — Z5111 Encounter for antineoplastic chemotherapy: Secondary | ICD-10-CM | POA: Diagnosis not present

## 2017-09-12 DIAGNOSIS — Z86711 Personal history of pulmonary embolism: Secondary | ICD-10-CM | POA: Insufficient documentation

## 2017-09-12 DIAGNOSIS — Z95828 Presence of other vascular implants and grafts: Secondary | ICD-10-CM

## 2017-09-12 DIAGNOSIS — C482 Malignant neoplasm of peritoneum, unspecified: Secondary | ICD-10-CM

## 2017-09-12 DIAGNOSIS — J449 Chronic obstructive pulmonary disease, unspecified: Secondary | ICD-10-CM | POA: Insufficient documentation

## 2017-09-12 LAB — CMP (CANCER CENTER ONLY)
ALBUMIN: 2.9 g/dL — AB (ref 3.5–5.0)
ALT: 9 U/L (ref 0–55)
ANION GAP: 9 (ref 3–11)
AST: 13 U/L (ref 5–34)
Alkaline Phosphatase: 75 U/L (ref 40–150)
BILIRUBIN TOTAL: 0.4 mg/dL (ref 0.2–1.2)
BUN: 19 mg/dL (ref 7–26)
CO2: 28 mmol/L (ref 22–29)
Calcium: 9.7 mg/dL (ref 8.4–10.4)
Chloride: 100 mmol/L (ref 98–109)
Creatinine: 0.88 mg/dL (ref 0.60–1.10)
GFR, Est AFR Am: >60 mL/min (ref >60)
GFR, Estimated: >60 mL/min (ref >60)
GLUCOSE: 103 mg/dL (ref 70–140)
Potassium: 4.1 mmol/L (ref 3.5–5.1)
SODIUM: 137 mmol/L (ref 136–145)
TOTAL PROTEIN: 6.5 g/dL (ref 6.4–8.3)

## 2017-09-12 LAB — CBC WITH DIFFERENTIAL (CANCER CENTER ONLY)
BASOS ABS: 0 10*3/uL (ref 0.0–0.1)
Basophils Relative: 0 %
Eosinophils Absolute: 0 10*3/uL (ref 0.0–0.5)
Eosinophils Relative: 1 %
HEMATOCRIT: 34.2 % — AB (ref 34.8–46.6)
Hemoglobin: 10.9 g/dL — ABNORMAL LOW (ref 11.6–15.9)
LYMPHS PCT: 28 %
Lymphs Abs: 0.9 10*3/uL (ref 0.9–3.3)
MCH: 28.6 pg (ref 25.1–34.0)
MCHC: 31.9 g/dL (ref 31.5–36.0)
MCV: 89.5 fL (ref 79.5–101.0)
MONO ABS: 0.5 10*3/uL (ref 0.1–0.9)
Monocytes Relative: 17 %
NEUTROS ABS: 1.6 10*3/uL (ref 1.5–6.5)
NEUTROS PCT: 54 %
Platelet Count: 333 10*3/uL (ref 145–400)
RBC: 3.82 MIL/uL (ref 3.70–5.45)
RDW: 19.5 % — ABNORMAL HIGH (ref 11.2–14.5)
WBC Count: 3.1 10*3/uL — ABNORMAL LOW (ref 3.9–10.3)

## 2017-09-12 MED ORDER — DIPHENHYDRAMINE HCL 25 MG PO CAPS
ORAL_CAPSULE | ORAL | Status: AC
  Filled 2017-09-12: qty 1

## 2017-09-12 MED ORDER — FAMOTIDINE IN NACL 20-0.9 MG/50ML-% IV SOLN: 20.0000 mg | Freq: Once | INTRAVENOUS | Status: DC

## 2017-09-12 MED ORDER — BEVACIZUMAB CHEMO INJECTION 400 MG/16ML
10.4000 mg/kg | Freq: Once | INTRAVENOUS | Status: AC
  Administered 2017-09-12: 900 mg via INTRAVENOUS
  Filled 2017-09-12: qty 32

## 2017-09-12 MED ORDER — DEXAMETHASONE SODIUM PHOSPHATE 10 MG/ML IJ SOLN
INTRAMUSCULAR | Status: AC
  Filled 2017-09-12: qty 1

## 2017-09-12 MED ORDER — DEXAMETHASONE SODIUM PHOSPHATE 10 MG/ML IJ SOLN
5.0000 mg | Freq: Once | INTRAMUSCULAR | Status: AC
  Administered 2017-09-12: 5 mg via INTRAVENOUS

## 2017-09-12 MED ORDER — SODIUM CHLORIDE 0.9 % IJ SOLN
10.0000 mL | INTRAMUSCULAR | Status: DC | PRN
  Administered 2017-09-12: 10 mL via INTRAVENOUS
  Filled 2017-09-12: qty 10

## 2017-09-12 MED ORDER — HEPARIN SOD (PORK) LOCK FLUSH 100 UNIT/ML IV SOLN
500.0000 [IU] | Freq: Once | INTRAVENOUS | Status: AC | PRN
  Administered 2017-09-12: 500 [IU]
  Filled 2017-09-12: qty 5

## 2017-09-12 MED ORDER — DIPHENHYDRAMINE HCL 25 MG PO TABS
25.0000 mg | ORAL_TABLET | Freq: Once | ORAL | Status: AC
  Administered 2017-09-12: 25 mg via ORAL
  Filled 2017-09-12: qty 1

## 2017-09-12 MED ORDER — SODIUM CHLORIDE 0.9 % IV SOLN
20.0000 mg | Freq: Once | INTRAVENOUS | Status: AC
  Administered 2017-09-12: 20 mg via INTRAVENOUS
  Filled 2017-09-12: qty 2

## 2017-09-12 MED ORDER — SODIUM CHLORIDE 0.9 % IV SOLN
80.0000 mg/m2 | Freq: Once | INTRAVENOUS | Status: AC
  Administered 2017-09-12: 150 mg via INTRAVENOUS
  Filled 2017-09-12: qty 25

## 2017-09-12 MED ORDER — SODIUM CHLORIDE 0.9 % IV SOLN
Freq: Once | INTRAVENOUS | Status: AC
  Administered 2017-09-12: 12:00:00 via INTRAVENOUS

## 2017-09-12 MED ORDER — SODIUM CHLORIDE 0.9% FLUSH
10.0000 mL | INTRAVENOUS | Status: DC | PRN
  Administered 2017-09-12: 10 mL
  Filled 2017-09-12: qty 10

## 2017-09-12 NOTE — Progress Notes (Signed)
[facility] OFFICE PROGRESS NOTE   Diagnosis: Fallopian tube cancer  INTERVAL HISTORY:   Lynn Ramirez returns as scheduled.  She completed another treatment with Taxol/Avastin on 08/29/2017.  She developed tingling and numbness of the face with flushing during the Taxol infusion.  She also had increased shortness of breath.  She received Pepcid.  The symptoms resolved.  She completed the Taxol chemotherapy. No change in neuropathy symptoms.  She has noted increased dyspnea for the past several weeks.  No bleeding.   Objective:  Vital signs in last 24 hours:  Blood pressure (!) 166/71, pulse 83, temperature 98.4 F (36.9 C), temperature source Oral, resp. rate 19, height 5\' 1"  (1.549 m), weight 186 lb 14.4 oz (84.8 kg), SpO2 100 %.    HEENT: No thrush Resp: Distant breath sounds Cardio: Regular rate and rhythm GI: No hepatomegaly, no apparent ascites, no mass Vascular: No leg edema  Portacath/PICC-without erythema  Lab Results:  Lab Results  Component Value Date   WBC 3.1 (L) 09/12/2017   HGB 11.2 (L) 08/29/2017   HCT 34.2 (L) 09/12/2017   MCV 89.5 09/12/2017   PLT 333 09/12/2017   NEUTROABS 1.6 09/12/2017    CMP     Component Value Date/Time   NA 137 09/12/2017 0928   NA 137 07/04/2017 1122   K 4.1 09/12/2017 0928   K 4.1 07/04/2017 1122   CL 100 09/12/2017 0928   CO2 28 09/12/2017 0928   CO2 30 (H) 07/04/2017 1122   GLUCOSE 103 09/12/2017 0928   GLUCOSE 104 07/04/2017 1122   BUN 19 09/12/2017 0928   BUN 15.8 07/04/2017 1122   CREATININE 0.88 09/12/2017 0928   CREATININE 0.9 07/04/2017 1122   CALCIUM 9.7 09/12/2017 0928   CALCIUM 9.4 07/04/2017 1122   PROT 6.5 09/12/2017 0928   PROT 7.0 07/04/2017 1122   ALBUMIN 2.9 (L) 09/12/2017 0928   ALBUMIN 3.4 (L) 07/04/2017 1122   AST 13 09/12/2017 0928   AST 13 07/04/2017 1122   ALT 9 09/12/2017 0928   ALT 10 07/04/2017 1122   ALKPHOS 75 09/12/2017 0928   ALKPHOS 73 07/04/2017 1122   BILITOT  0.4 09/12/2017 0928   BILITOT 0.49 07/04/2017 1122   GFRNONAA >60 09/12/2017 0928   GFRAA >60 09/12/2017 0928    Imaging:  Ct Abdomen Pelvis W Contrast  Result Date: 09/10/2017 CLINICAL DATA:  Primary peritoneal carcinomatosis/ovarian cancer diagnosed 3/16 with hysterectomy/oophorectomy. Ongoing chemotherapy. Left nephrectomy for congenital defect. EXAM: CT ABDOMEN AND PELVIS WITH CONTRAST TECHNIQUE: Multidetector CT imaging of the abdomen and pelvis was performed using the standard protocol following bolus administration of intravenous contrast. CONTRAST:  135mL ISOVUE-300 IOPAMIDOL (ISOVUE-300) INJECTION 61% COMPARISON:  Plain film 06/13/2017.  Most recent CT 02/12/2017. FINDINGS: Lower chest: Redemonstration of septal thickening at the right lung base. Decreased left base aeration with areas of scattered ground-glass opacity. Right lower lobe nodularity, including at 3 mm on image 12/4, new. Right middle lobe and lingular volume loss are again identified. Mild cardiomegaly with a tiny hiatal hernia. Trace right pleural fluid is unchanged. Hepatobiliary: Normal liver. Cholecystectomy, without biliary ductal dilatation. Pancreas: Normal, without mass or ductal dilatation. Spleen: Normal in size, without focal abnormality. Adrenals/Urinary Tract: Minimal right adrenal nodularity is unchanged. A left adrenal nodule measures 1.2 cm on image 18/2 and is similar. Left kidney absent. Normal right kidney. The bladder wall appears thickened, accentuated by underdistention. Example image 75/2. Stomach/Bowel: Normal remainder of the stomach. Extensive colonic diverticulosis. Normal terminal ileum.  Normal small bowel caliber. Vascular/Lymphatic: Aortic and branch vessel atherosclerosis. No abdominopelvic adenopathy. Reproductive: Hysterectomy.  No adnexal mass. Other: Mild pelvic floor laxity. Trace perihepatic ascites is similar, including on image 19/2. There is also trace fluid anterior to the transverse colon on  image 34/2. This is new. No well-defined omental or peritoneal implants. Mild mesenteric thickening, including on image 51/2. New or increased. Musculoskeletal: Left hip osteoarthritis. Degenerate disc disease at L2-3. IMPRESSION: 1. Slight increase in trace abdominal ascites with new or progressive mesenteric edema. Findings are nonspecific and could represent fluid overload. No specific evidence of recurrent or residual peritoneal carcinomatosis. 2. Worsening bibasilar aeration, including developing right lower lobe nodularity and patchy left base ground-glass. Correlate with infectious symptoms. Consider dedicated chest CT to exclude pulmonary metastasis. 3. Bladder wall thickening superimposed upon underdistention. Consider correlation with urinalysis. Electronically Signed   By: Abigail Miyamoto M.D.   On: 09/10/2017 16:19    Medications: I have reviewed the patient's current medications.   Assessment/Plan: 1. 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 Significant improvement in malignant right pleural effusion.  Status post  robotic-assisted laparoscopic hysterectomy with bilateral salpingoophorectomy, omentectomy, radical tumor debulking 12/29/2014. Per Dr. Serita Grit office note 01/14/2015 cytoreduction was optimal with residual disease remaining only in the 1 mm implants on the small intestine. 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 Cycle 1 adjuvant Taxol/carboplatin 01/20/2015 Cycle 2 adjuvant Taxol/carboplatin 02/10/2015 Cycle 3 adjuvant Taxol/carboplatin 03/10/2015 CA 125 on 1013 2016-42  CTs of the chest, abdomen, and pelvis 05/31/2015 with no evidence of progressive ovarian cancer  CTs of the chest, abdomen, and pelvis 08/07/2015 and 08/08/2015-no evidence of progressive ovarian cancer  Peritoneal studding noted at the time of the cholecystectomy procedure 08/09/2015 Elevated CA 125 10/07/2015  CT 10/07/2015 with stricturing at the sigmoid colon, constipation, and omental nodularity  Initiation of salvage weekly Taxol chemotherapy 10/13/2015  Taxol changed to every 2 weeks beginning 01/19/2016 due to painful neuropathy.  CT scans 07/19/2016 with no acute findings. No features in the abdomen or pelvis to suggest recurrent disease.Stable mild fullness right intrarenal collecting system.  Elevated CA 125 treatment resumed with Taxol/Avastin on a 2 week schedule 09/27/2016  CT 02/12/2017-no evidence of carcinomatosis, no evidence of progressive metastatic disease  Taxol/Avastin continued every 2 weeks  CT 09/10/2017- increase in trace ascites, no evidence of progressive carcinomatosis, inflammatory changes at the lung bases with a new 3 mm right lower lobe nodule  Taxol/Avastin continued every 2 weeks 2. COPD-followed  by Dr. Lamonte Sakai. 3. Dyspnea secondary to COPD and the large right pleural effusion, status post therapeutic thoracentesis procedures 09/30/2014,10/09/2014, and 10/21/2014. Left thoracentesis 10/30/2014 4. Left nephrectomy as a child 5. Delayed nausea following cycle 1 Taxol/carboplatin, Aloxi/Emend added with cycle 2  with improvement. 6. Right lower extremity edema, right calf pain 12/09/2014. Negative venous Doppler 12/09/2014. 7. Diffuse pruritus following cycle 1 adjuvant Taxol/carboplatin 01/20/2015, no rash, resolved with a steroid dose pack 8. Thrombocytopenia second to chemotherapy, the carboplatin was dose reduced with cycle 2 adjuvant Taxol/carboplatin 02/10/2015 9. Chemotherapy-induced peripheral neuropathy-painful peripheral neuropathy involving the feet 01/19/2016.  10. Admission with acute cholecystitis 08/07/2015, status post a cholecystectomy 08/09/2015 11. Admission 10/08/2015 with abdominal pain/constipation, improved with bowel rest and laxatives 12. Mild right hydronephrosis noted on the CT 10/07/2015. Renal ultrasound 12/16/2015 with no hydronephrosis noted.No hydronephrosis on CT 07/19/2016.   Disposition: Her overall status appears unchanged.  The restaging CT reveals no clear evidence of disease progression.  I reviewed the CT images with Lynn Ramirez.  We discussed a treatment break versus continuing Taxol/Avastin.  Decided to continue Taxol/Avastin on a 2-week schedule.  She plans to take a treatment break in late May prior to a planned vacation.  I doubt the symptoms she experienced during the last Taxol infusion were related to a "allergic reaction ".  She has been maintained on Taxol chronically.  25 minutes were spent with the patient today.  The majority of the time was used for counseling and coordination of care.  Betsy Coder, MD  09/12/2017  11:03 AM

## 2017-09-12 NOTE — Telephone Encounter (Signed)
Printed avs and calender of upcoming appointment. 3/6 los

## 2017-09-12 NOTE — Patient Instructions (Signed)
[facility] Discharge Instructions for Patients Receiving Chemotherapy  Today you received the following chemotherapy agents:  Avastin, Taxol  To help prevent nausea and vomiting after your treatment, we encourage you to take your nausea medication as prescribed.   If you develop nausea and vomiting that is not controlled by your nausea medication, call the clinic.   BELOW ARE SYMPTOMS THAT SHOULD BE REPORTED IMMEDIATELY:  *FEVER GREATER THAN 100.5 F  *CHILLS WITH OR WITHOUT FEVER  NAUSEA AND VOMITING THAT IS NOT CONTROLLED WITH YOUR NAUSEA MEDICATION  *UNUSUAL SHORTNESS OF BREATH  *UNUSUAL BRUISING OR BLEEDING  TENDERNESS IN MOUTH AND THROAT WITH OR WITHOUT PRESENCE OF ULCERS  *URINARY PROBLEMS  *BOWEL PROBLEMS  UNUSUAL RASH Items with * indicate a potential emergency and should be followed up as soon as possible.  Feel free to call the clinic should you have any questions or concerns. The clinic phone number is [phone].  Please show the [facility] at check-in to the Emergency Department and triage nurse.

## 2017-09-13 LAB — CA 125: Cancer Antigen (CA) 125: 611.6 U/mL — ABNORMAL HIGH (ref 0.0–38.1)

## 2017-09-14 ENCOUNTER — Ambulatory Visit (INDEPENDENT_AMBULATORY_CARE_PROVIDER_SITE_OTHER)
Admission: RE | Admit: 2017-09-14 | Discharge: 2017-09-14 | Disposition: A | Discharge status: Home / Self Care | Payer: Medicare Other | Source: Ambulatory Visit | Attending: Adult Health | Admitting: Adult Health

## 2017-09-14 ENCOUNTER — Encounter: Payer: Self-pay | Admitting: Adult Health

## 2017-09-14 ENCOUNTER — Ambulatory Visit (INDEPENDENT_AMBULATORY_CARE_PROVIDER_SITE_OTHER): Payer: Medicare Other | Admitting: Adult Health

## 2017-09-14 VITALS — BP 136/78 | HR 73 | Ht 61.0 in | Wt 185.0 lb

## 2017-09-14 DIAGNOSIS — J9611 Chronic respiratory failure with hypoxia: Secondary | ICD-10-CM

## 2017-09-14 DIAGNOSIS — I25119 Atherosclerotic heart disease of native coronary artery with unspecified angina pectoris: Secondary | ICD-10-CM

## 2017-09-14 DIAGNOSIS — R05 Cough: Secondary | ICD-10-CM | POA: Diagnosis not present

## 2017-09-14 DIAGNOSIS — J441 Chronic obstructive pulmonary disease with (acute) exacerbation: Secondary | ICD-10-CM | POA: Diagnosis not present

## 2017-09-14 DIAGNOSIS — J449 Chronic obstructive pulmonary disease, unspecified: Secondary | ICD-10-CM

## 2017-09-14 MED ORDER — DOXYCYCLINE HYCLATE 100 MG PO TABS: 100.0000 mg | ORAL_TABLET | Freq: Two times a day (BID) | ORAL | 0 refills | Status: DC

## 2017-09-14 MED ORDER — BENZONATATE 200 MG PO CAPS: 200.0000 mg | ORAL_CAPSULE | Freq: Three times a day (TID) | ORAL | 1 refills | Status: DC | PRN

## 2017-09-14 NOTE — Patient Instructions (Addendum)
Chest xray today .  Doxycyline 100mg  Twice daily  W/ food for 1 week.  Mucinex Twice daily  Twice daily  As needed  Cough/congestion  Delsym 2 tsp.Twice daily  As needed  Cough .  Tessalon Three times a day  As needed  Cough .  Continue on Stiolto and Arnuity 1 puff daily . Rinse after use .  Follow up with Dr. Lamonte Sakai  In 4 weeks and As needed   Please contact office for sooner follow up if symptoms do not improve or worsen or seek emergency care

## 2017-09-14 NOTE — Progress Notes (Signed)
@Patient  ID: Skeet Simmer, female    DOB: 02-22-1941, 77 y.o.   MRN: [id number]  Chief Complaint  Patient presents with  . Follow-up    COPD    Referring provider: Midge Minium, MD  HPI: 77 year old female former smoker followed for COPD, chronic hypoxic respiratory failure on oxygen at 2 L, ovarian cancer associated pleural effusions  09/14/2017 Acute OV : COPD  Pt presents for an acute office visit . Complains of increased cough, congestion for last week. Coughing up thick yellow mucus. Cough is aggravating her . Has been using Nexium for GERD related cough but is not helping . Wants something for cough .  Remains on Stiolto and Arnuity . Marland Kitchen  On Oxygen 2l/m . Feels it helps.  Pt has ovarian cancer , recent survelliance CT abd/pelvis showed decreased BB aeration RLL nodularity and L basilar GG .    Allergies  Allergen Reactions  . Latex Rash    Latex gloves ONLY (pt cannot wear them- no reaction if someone else touches her with latex gloves on)  . Penicillins Other (See Comments)    Has patient had a PCN reaction causing immediate rash, facial/tongue/throat swelling, SOB or lightheadedness with hypotension: Yes  Has patient had a PCN reaction causing severe rash involving mucus membranes or skin necrosis:No Has patient had a PCN reaction that required hospitalization:No Has patient had a PCN reaction occurring within the last 10 years:No If all of the above answers are "NO", then may proceed with Cephalosporin use. welps    Immunization History  Administered Date(s) Administered  . Influenza Split 03/26/2014  . Influenza Whole 04/09/2012  . Influenza, High Dose Seasonal PF 03/10/2013, 04/25/2017  . Influenza,inj,Quad PF,6+ Mos 05/06/2015, 04/12/2016  . Pneumococcal Conjugate-13 05/06/2015  . Pneumococcal Polysaccharide-23 05/17/2016    Past Medical History:  Diagnosis Date  . CKD (chronic kidney disease), stage II   . COPD (chronic obstructive pulmonary  disease) ([place])   . Coronary artery calcification seen on CT scan   . Difficulty sleeping   . Eczema    hands  . Emphysema   . GERD (gastroesophageal reflux disease)   . H/O hydronephrosis   . History of transfusion    age 65  . Hyperlipidemia   . Hypertension   . Ovarian cancer ([place]) dx'd 09/2014   metastatic - prior malignant R pleural effusion and peritoneal carcinomatosis  . S/p nephrectomy     Tobacco History: Social History   Tobacco Use  Smoking Status Former Smoker  . Packs/day: 1.00  . Years: 40.00  . Pack years: 40.00  . Types: Cigarettes  . Last attempt to quit: 07/10/2013  . Years since quitting: 4.1  Smokeless Tobacco Never Used   Counseling given: Not Answered   Outpatient Encounter Medications as of 09/14/2017  Medication Sig  . Acetaminophen (TYLENOL) 325 MG CAPS Take 1 tablet by mouth 3 (three) times daily as needed (pain).   Marland Kitchen albuterol (PROVENTIL) (2.5 MG/3ML) 0.083% nebulizer solution Take 3 mLs (2.5 mg total) by nebulization every 6 (six) hours as needed for wheezing or shortness of breath.  Marland Kitchen antiseptic oral rinse (BIOTENE) LIQD 15 mLs by Mouth Rinse route 3 (three) times daily.  . ARNUITY ELLIPTA 200 MCG/ACT AEPB INHALE 1 PUFF INTO THE LUNGS DAILY  . atorvastatin (LIPITOR) 10 MG tablet TAKE 1 TABLET(10 MG) BY MOUTH DAILY  . Biotin 2500 MCG CAPS Take 1 tablet by mouth daily.  . carvedilol (COREG) 3.125 MG tablet TAKE 1 TABLET(3.125 MG) BY  MOUTH TWICE DAILY WITH A MEAL  . cholecalciferol (VITAMIN D) 1000 units tablet Take 1,000 Units by mouth daily.  . diphenoxylate-atropine (LOMOTIL) 2.5-0.025 MG tablet TAKE 2 TABLETS BY MOUTH FOUR TIMES DAILY AS NEEDED FOR DIARRHEA/ LOOSE STOOLS  . esomeprazole (NEXIUM) 40 MG capsule Take 1 capsule (40 mg total) by mouth daily.  . fluocinonide cream (LIDEX) 8.34 % Apply 1 application topically 2 (two) times daily as needed (For ezcema).   . gabapentin (NEURONTIN) 300 MG capsule TAKE 1 CAPSULE BY MOUTH EVERY NIGHT AT  BEDTIME  . lidocaine-prilocaine (EMLA) cream Apply to port site one hour prior to use. Do not rub in. Cover with plastic.  Marland Kitchen loratadine (CLARITIN) 10 MG tablet Take 10 mg by mouth daily.  . ondansetron (ZOFRAN-ODT) 4 MG disintegrating tablet Take 1 tablet (4 mg total) by mouth every 8 (eight) hours as needed for nausea or vomiting.  Marland Kitchen oxyCODONE-acetaminophen (PERCOCET/ROXICET) 5-325 MG tablet Take 1 tablet by mouth every 6 (six) hours as needed for severe pain.  Marland Kitchen PROAIR HFA 108 (90 Base) MCG/ACT inhaler INHALE 2 PUFFS INTO THE LUNGS FOUR TIMES DAILY AS NEEDED FOR WHEEZING  . sorbitol 70 % solution Take 30 cc twice daily until bowel movement then once daily  . STIOLTO RESPIMAT 2.5-2.5 MCG/ACT AERS INHALE 2 PUFFS INTO THE LUNGS DAILY  . Tiotropium Bromide-Olodaterol (STIOLTO RESPIMAT) 2.5-2.5 MCG/ACT AERS Inhale 2 puffs into the lungs daily.  . traMADol (ULTRAM) 50 MG tablet Take 1 tablet (50 mg total) by mouth every 12 (twelve) hours as needed. for pain  . triamterene-hydrochlorothiazide (MAXZIDE-25) 37.5-25 MG tablet Take 0.5 tablets by mouth daily.   Marland Kitchen VIRTUSSIN A/C 100-10 MG/5ML syrup TAKE 10 ML BY MOUTH THREE TIMES DAILY AS NEEDED FOR COUGH  . vitamin B-12 (CYANOCOBALAMIN) 100 MCG tablet Take 100 mcg by mouth daily. Reported on 01/19/2016  . zolpidem (AMBIEN) 5 MG tablet Take 1 tablet (5 mg total) by mouth at bedtime as needed for sleep.   No facility-administered encounter medications on file as of 09/14/2017.      Review of Systems  Constitutional:   No  weight loss, night sweats,  Fevers, chills, + fatigue, or  lassitude.  HEENT:   No headaches,  Difficulty swallowing,  Tooth/dental problems, or  Sore throat,                No sneezing, itching, ear ache,  +nasal congestion, post nasal drip,   CV:  No chest pain,  Orthopnea, PND, swelling in lower extremities, anasarca, dizziness, palpitations, syncope.   GI  No heartburn, indigestion, abdominal pain, nausea, vomiting, diarrhea,  change in bowel habits, loss of appetite, bloody stools.   Resp:    No chest wall deformity  Skin: no rash or lesions.  GU: no dysuria, change in color of urine, no urgency or frequency.  No flank pain, no hematuria   MS:  No joint pain or swelling.  No decreased range of motion.  No back pain.    Physical Exam    GEN: A/Ox3; pleasant , NAD, elderly    HEENT:  [place]/AT,  EACs-clear, TMs-wnl, NOSE-clear, THROAT-clear, no lesions, no postnasal drip or exudate noted.   NECK:  Supple w/ fair ROM; no JVD; normal carotid impulses w/o bruits; no thyromegaly or nodules palpated; no lymphadenopathy.    RESP  Clear  P & A; w/o, wheezes/ rales/ or rhonchi. no accessory muscle use, no dullness to percussion  CARD:  RRR, no m/r/g, no peripheral edema, pulses intact,  no cyanosis or clubbing.  GI:   Soft & nt; nml bowel sounds; no organomegaly or masses detected.   Musco: Warm bil, no deformities or joint swelling noted.   Neuro: alert, no focal deficits noted.    Skin: Warm, no lesions or rashes    Lab Results:   BMET  BNP  ProBNP No results found for: PROBNP  Imaging: Ct Abdomen Pelvis W Contrast  Result Date: 09/10/2017 CLINICAL DATA:  Primary peritoneal carcinomatosis/ovarian cancer diagnosed 3/16 with hysterectomy/oophorectomy. Ongoing chemotherapy. Left nephrectomy for congenital defect. EXAM: CT ABDOMEN AND PELVIS WITH CONTRAST TECHNIQUE: Multidetector CT imaging of the abdomen and pelvis was performed using the standard protocol following bolus administration of intravenous contrast. CONTRAST:  131mL ISOVUE-300 IOPAMIDOL (ISOVUE-300) INJECTION 61% COMPARISON:  Plain film 06/13/2017.  Most recent CT 02/12/2017. FINDINGS: Lower chest: Redemonstration of septal thickening at the right lung base. Decreased left base aeration with areas of scattered ground-glass opacity. Right lower lobe nodularity, including at 3 mm on image 12/4, new. Right middle lobe and lingular volume loss are  again identified. Mild cardiomegaly with a tiny hiatal hernia. Trace right pleural fluid is unchanged. Hepatobiliary: Normal liver. Cholecystectomy, without biliary ductal dilatation. Pancreas: Normal, without mass or ductal dilatation. Spleen: Normal in size, without focal abnormality. Adrenals/Urinary Tract: Minimal right adrenal nodularity is unchanged. A left adrenal nodule measures 1.2 cm on image 18/2 and is similar. Left kidney absent. Normal right kidney. The bladder wall appears thickened, accentuated by underdistention. Example image 75/2. Stomach/Bowel: Normal remainder of the stomach. Extensive colonic diverticulosis. Normal terminal ileum. Normal small bowel caliber. Vascular/Lymphatic: Aortic and branch vessel atherosclerosis. No abdominopelvic adenopathy. Reproductive: Hysterectomy.  No adnexal mass. Other: Mild pelvic floor laxity. Trace perihepatic ascites is similar, including on image 19/2. There is also trace fluid anterior to the transverse colon on image 34/2. This is new. No well-defined omental or peritoneal implants. Mild mesenteric thickening, including on image 51/2. New or increased. Musculoskeletal: Left hip osteoarthritis. Degenerate disc disease at L2-3. IMPRESSION: 1. Slight increase in trace abdominal ascites with new or progressive mesenteric edema. Findings are nonspecific and could represent fluid overload. No specific evidence of recurrent or residual peritoneal carcinomatosis. 2. Worsening bibasilar aeration, including developing right lower lobe nodularity and patchy left base ground-glass. Correlate with infectious symptoms. Consider dedicated chest CT to exclude pulmonary metastasis. 3. Bladder wall thickening superimposed upon underdistention. Consider correlation with urinalysis. Electronically Signed   By: Abigail Miyamoto M.D.   On: 09/10/2017 16:19     Assessment & Plan:   No problem-specific Assessment & Plan notes found for this encounter.     Rexene Edison,  NP 09/14/2017

## 2017-09-17 NOTE — Assessment & Plan Note (Addendum)
Flare  Check cxr  , pending results consider CT Chest .  Close follow up in 4 weeks .  Pt has ovarian cancer with previous malignant pleural effusion, check cxr for recurrence   Plan  Patient Instructions  Chest xray today .  Doxycyline 100mg  Twice daily  W/ food for 1 week.  Mucinex Twice daily  Twice daily  As needed  Cough/congestion  Delsym 2 tsp.Twice daily  As needed  Cough .  Tessalon Three times a day  As needed  Cough .  Continue on Stiolto and Arnuity 1 puff daily . Rinse after use .  Follow up with Dr. Lamonte Sakai  In 4 weeks and As needed   Please contact office for sooner follow up if symptoms do not improve or worsen or seek emergency care

## 2017-09-17 NOTE — Progress Notes (Signed)
Was able to talk to the patient regarding their results.  They verbalized an understanding of what was discussed. No further questions at this time. 

## 2017-09-17 NOTE — Assessment & Plan Note (Signed)
Cont on O2 .  

## 2017-09-23 ENCOUNTER — Other Ambulatory Visit: Payer: Self-pay | Admitting: Oncology

## 2017-09-23 DIAGNOSIS — C5700 Malignant neoplasm of unspecified fallopian tube: Secondary | ICD-10-CM

## 2017-09-26 ENCOUNTER — Inpatient Hospital Stay: Payer: Medicare Other

## 2017-09-26 ENCOUNTER — Inpatient Hospital Stay (HOSPITAL_BASED_OUTPATIENT_CLINIC_OR_DEPARTMENT_OTHER): Payer: Medicare Other | Admitting: Oncology

## 2017-09-26 ENCOUNTER — Telehealth: Payer: Self-pay | Admitting: Oncology

## 2017-09-26 VITALS — BP 154/64 | HR 79 | Temp 97.7°F | Resp 18 | Ht 61.0 in | Wt 187.4 lb

## 2017-09-26 VITALS — BP 139/63 | HR 75 | Resp 18

## 2017-09-26 DIAGNOSIS — C786 Secondary malignant neoplasm of retroperitoneum and peritoneum: Secondary | ICD-10-CM

## 2017-09-26 DIAGNOSIS — C799 Secondary malignant neoplasm of unspecified site: Secondary | ICD-10-CM

## 2017-09-26 DIAGNOSIS — Z5111 Encounter for antineoplastic chemotherapy: Secondary | ICD-10-CM | POA: Diagnosis not present

## 2017-09-26 DIAGNOSIS — C5700 Malignant neoplasm of unspecified fallopian tube: Secondary | ICD-10-CM

## 2017-09-26 DIAGNOSIS — Z95828 Presence of other vascular implants and grafts: Secondary | ICD-10-CM

## 2017-09-26 DIAGNOSIS — C482 Malignant neoplasm of peritoneum, unspecified: Secondary | ICD-10-CM

## 2017-09-26 DIAGNOSIS — C569 Malignant neoplasm of unspecified ovary: Secondary | ICD-10-CM

## 2017-09-26 DIAGNOSIS — Z79899 Other long term (current) drug therapy: Secondary | ICD-10-CM

## 2017-09-26 DIAGNOSIS — C562 Malignant neoplasm of left ovary: Secondary | ICD-10-CM

## 2017-09-26 DIAGNOSIS — K5900 Constipation, unspecified: Secondary | ICD-10-CM | POA: Diagnosis not present

## 2017-09-26 DIAGNOSIS — J449 Chronic obstructive pulmonary disease, unspecified: Secondary | ICD-10-CM | POA: Diagnosis not present

## 2017-09-26 DIAGNOSIS — C561 Malignant neoplasm of right ovary: Secondary | ICD-10-CM

## 2017-09-26 LAB — CBC WITH DIFFERENTIAL (CANCER CENTER ONLY)
Basophils Absolute: 0 10*3/uL (ref 0.0–0.1)
Basophils Relative: 1 %
EOS ABS: 0.1 10*3/uL (ref 0.0–0.5)
EOS PCT: 2 %
HCT: 38.6 % (ref 34.8–46.6)
HEMOGLOBIN: 11.4 g/dL — AB (ref 11.6–15.9)
Lymphocytes Relative: 27 %
Lymphs Abs: 1.2 10*3/uL (ref 0.9–3.3)
MCH: 27.9 pg (ref 25.1–34.0)
MCHC: 29.5 g/dL — ABNORMAL LOW (ref 31.5–36.0)
MCV: 94.6 fL (ref 79.5–101.0)
MONOS PCT: 13 %
Monocytes Absolute: 0.6 10*3/uL (ref 0.1–0.9)
NEUTROS PCT: 57 %
Neutro Abs: 2.6 10*3/uL (ref 1.5–6.5)
Platelet Count: 309 10*3/uL (ref 145–400)
RBC: 4.08 MIL/uL (ref 3.70–5.45)
RDW: 18.6 % — ABNORMAL HIGH (ref 11.2–14.5)
WBC Count: 4.5 10*3/uL (ref 3.9–10.3)

## 2017-09-26 LAB — CMP (CANCER CENTER ONLY)
ALK PHOS: 81 U/L (ref 40–150)
ALT: 9 U/L (ref 0–55)
ANION GAP: 6 (ref 3–11)
AST: 13 U/L (ref 5–34)
Albumin: 3.2 g/dL — ABNORMAL LOW (ref 3.5–5.0)
BILIRUBIN TOTAL: 0.4 mg/dL (ref 0.2–1.2)
BUN: 16 mg/dL (ref 7–26)
CALCIUM: 10.1 mg/dL (ref 8.4–10.4)
CO2: 31 mmol/L — AB (ref 22–29)
Chloride: 102 mmol/L (ref 98–109)
Creatinine: 0.83 mg/dL (ref 0.60–1.10)
GFR, Est AFR Am: >60 mL/min (ref >60)
GFR, Estimated: >60 mL/min (ref >60)
Glucose, Bld: 105 mg/dL (ref 70–140)
Potassium: 4.4 mmol/L (ref 3.5–5.1)
SODIUM: 139 mmol/L (ref 136–145)
TOTAL PROTEIN: 6.9 g/dL (ref 6.4–8.3)

## 2017-09-26 LAB — TOTAL PROTEIN, URINE DIPSTICK: Protein, ur: NEGATIVE mg/dL

## 2017-09-26 MED ORDER — TRAMADOL HCL 50 MG PO TABS: 50.0000 mg | ORAL_TABLET | Freq: Two times a day (BID) | ORAL | 0 refills | Status: DC | PRN

## 2017-09-26 MED ORDER — ALTEPLASE 2 MG IJ SOLR
2.0000 mg | Freq: Once | INTRAMUSCULAR | Status: AC | PRN
  Administered 2017-09-26: 2 mg
  Filled 2017-09-26: qty 2

## 2017-09-26 MED ORDER — DEXAMETHASONE SODIUM PHOSPHATE 10 MG/ML IJ SOLN
5.0000 mg | Freq: Once | INTRAMUSCULAR | Status: AC
  Administered 2017-09-26: 5 mg via INTRAVENOUS

## 2017-09-26 MED ORDER — SODIUM CHLORIDE 0.9% FLUSH
10.0000 mL | INTRAVENOUS | Status: DC | PRN
  Administered 2017-09-26: 10 mL
  Filled 2017-09-26: qty 10

## 2017-09-26 MED ORDER — DIPHENHYDRAMINE HCL 25 MG PO TABS
25.0000 mg | ORAL_TABLET | Freq: Once | ORAL | Status: AC
  Administered 2017-09-26: 25 mg via ORAL
  Filled 2017-09-26: qty 1

## 2017-09-26 MED ORDER — FAMOTIDINE IN NACL 20-0.9 MG/50ML-% IV SOLN
INTRAVENOUS | Status: AC
  Filled 2017-09-26: qty 50

## 2017-09-26 MED ORDER — SODIUM CHLORIDE 0.9 % IV SOLN
80.0000 mg/m2 | Freq: Once | INTRAVENOUS | Status: AC
  Administered 2017-09-26: 150 mg via INTRAVENOUS
  Filled 2017-09-26: qty 25

## 2017-09-26 MED ORDER — DEXAMETHASONE SODIUM PHOSPHATE 10 MG/ML IJ SOLN
INTRAMUSCULAR | Status: AC
  Filled 2017-09-26: qty 1

## 2017-09-26 MED ORDER — SODIUM CHLORIDE 0.9 % IV SOLN
Freq: Once | INTRAVENOUS | Status: AC
  Administered 2017-09-26: 12:00:00 via INTRAVENOUS

## 2017-09-26 MED ORDER — SODIUM CHLORIDE 0.9 % IJ SOLN
10.0000 mL | INTRAMUSCULAR | Status: DC | PRN
  Administered 2017-09-26: 10 mL via INTRAVENOUS
  Filled 2017-09-26: qty 10

## 2017-09-26 MED ORDER — SODIUM CHLORIDE 0.9 % IV SOLN
10.4000 mg/kg | Freq: Once | INTRAVENOUS | Status: AC
  Administered 2017-09-26: 900 mg via INTRAVENOUS
  Filled 2017-09-26: qty 4

## 2017-09-26 MED ORDER — HEPARIN SOD (PORK) LOCK FLUSH 100 UNIT/ML IV SOLN
500.0000 [IU] | Freq: Once | INTRAVENOUS | Status: AC | PRN
  Administered 2017-09-26: 500 [IU]
  Filled 2017-09-26: qty 5

## 2017-09-26 MED ORDER — FAMOTIDINE IN NACL 20-0.9 MG/50ML-% IV SOLN
20.0000 mg | Freq: Once | INTRAVENOUS | Status: AC
  Administered 2017-09-26: 20 mg via INTRAVENOUS

## 2017-09-26 MED ORDER — DIPHENHYDRAMINE HCL 25 MG PO CAPS
ORAL_CAPSULE | ORAL | Status: AC
Start: 2017-09-26 — End: 2017-09-26
  Filled 2017-09-26: qty 1

## 2017-09-26 NOTE — Telephone Encounter (Signed)
Appointments scheduled AVS/Calendar printed per 3/20 los 

## 2017-09-26 NOTE — Progress Notes (Signed)
[facility] OFFICE PROGRESS NOTE   Diagnosis: Ovarian cancer  INTERVAL HISTORY:   [name] returns as scheduled.  She completed another treatment with Taxol/Avastin on 09/12/2017.  She tolerated the chemotherapy without symptoms of an allergic reaction.  No bleeding.  She has intermittent discomfort at the left posterior thigh that resolved spontaneously.  She has noted increased bilateral leg swelling recently.  Her chief complaint is a persistent cough.  She has been evaluated by pulmonary medicine. A chest x-ray 09/14/2017 revealed mild basilar atelectasis.  She reports malaise. No change in neuropathy symptoms.  Objective:  Vital signs in last 24 hours:  Blood pressure (!) 154/64, pulse 79, temperature 97.7 F (36.5 C), temperature source Oral, resp. rate 18, height 5\' 1"  (1.549 m), weight 187 lb 6.4 oz (85 kg), SpO2 94 %.    HEENT: No thrush Resp: Lungs clear bilaterally, no respiratory distress Cardio: Regular rate and rhythm GI: No hepatomegaly, no apparent ascites Vascular: Trace pitting edema at the lower leg bilaterally.  No erythema.   Portacath/PICC-without erythema  Lab Results:  Lab Results  Component Value Date   WBC 4.5 09/26/2017   HGB 11.2 (L) 08/29/2017   HCT 38.6 09/26/2017   MCV 94.6 09/26/2017   PLT 309 09/26/2017   NEUTROABS 2.6 09/26/2017    CMP     Component Value Date/Time   NA 139 09/26/2017 0911   NA 137 07/04/2017 1122   K 4.4 09/26/2017 0911   K 4.1 07/04/2017 1122   CL 102 09/26/2017 0911   CO2 31 (H) 09/26/2017 0911   CO2 30 (H) 07/04/2017 1122   GLUCOSE 105 09/26/2017 0911   GLUCOSE 104 07/04/2017 1122   BUN 16 09/26/2017 0911   BUN 15.8 07/04/2017 1122   CREATININE 0.83 09/26/2017 0911   CREATININE 0.9 07/04/2017 1122   CALCIUM 10.1 09/26/2017 0911   CALCIUM 9.4 07/04/2017 1122   PROT 6.9 09/26/2017 0911   PROT 7.0 07/04/2017 1122   ALBUMIN 3.2 (L) 09/26/2017 0911   ALBUMIN 3.4 (L) 07/04/2017 1122   AST  13 09/26/2017 0911   AST 13 07/04/2017 1122   ALT 9 09/26/2017 0911   ALT 10 07/04/2017 1122   ALKPHOS 81 09/26/2017 0911   ALKPHOS 73 07/04/2017 1122   BILITOT 0.4 09/26/2017 0911   BILITOT 0.49 07/04/2017 1122   GFRNONAA >60 09/26/2017 0911   GFRAA >60 09/26/2017 0911    Medications: I have reviewed the patient's current medications.   Assessment/Plan: 1. 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 Significant improvement in malignant right pleural effusion.  Status post robotic-assisted laparoscopic hysterectomy with bilateral salpingoophorectomy, omentectomy, radical tumor debulking 12/29/2014. Per Dr. Serita Grit office note 01/14/2015 cytoreduction was optimal with residual disease remaining only in the 1 mm implants on the small intestine. Pathology on the omentum showed high-grade serous carcinoma; papillary high-grade serous carcinoma arising from the right fallopian tube; high-grade serous carcinoma involving the right ovary; high-grade serous  carcinoma involving paratubal soft tissue of the left fallopian tube; high-grade  serous carcinoma involving left ovary. Cycle 1 adjuvant Taxol/carboplatin 01/20/2015 Cycle 2 adjuvant Taxol/carboplatin 02/10/2015 Cycle 3 adjuvant Taxol/carboplatin 03/10/2015 CA 125 on 1013 2016-42  CTs of the chest, abdomen, and pelvis 05/31/2015 with no evidence of progressive ovarian cancer  CTs of the chest, abdomen, and pelvis 08/07/2015 and 08/08/2015-no evidence of progressive ovarian cancer  Peritoneal studding noted at the time of the cholecystectomy procedure 08/09/2015 Elevated CA 125 10/07/2015  CT 10/07/2015 with stricturing at the sigmoid colon, constipation, and omental nodularity  Initiation of salvage weekly Taxol chemotherapy 10/13/2015  Taxol changed to every 2 weeks beginning 01/19/2016 due to painful neuropathy.  CT scans 07/19/2016 with no acute findings. No features in the abdomen or pelvis to suggest recurrent disease.Stable mild fullness right intrarenal collecting system.  Elevated CA 125 treatment resumed with Taxol/Avastin on a 2 week schedule 09/27/2016  CT 02/12/2017-no evidence of carcinomatosis, no evidence of progressive metastatic disease  Taxol/Avastin continued every 2 weeks  CT 09/10/2017- increase in trace ascites, no evidence of progressive carcinomatosis, inflammatory changes at the lung bases with a new 3 mm right lower lobe nodule  Taxol/Avastin continued every 2 weeks 2. COPD-followed by Dr. Lamonte Sakai. 3. Dyspnea secondary to COPD and the large right pleural effusion, status post therapeutic thoracentesis procedures 09/30/2014,10/09/2014, and 10/21/2014. Left thoracentesis 10/30/2014 4. Left nephrectomy as a child 5. Delayed nausea following cycle 1 Taxol/carboplatin, Aloxi/Emend added with cycle 2 with improvement. 6. Right lower extremity edema, right calf pain 12/09/2014. Negative venous Doppler 12/09/2014. 7. Diffuse pruritus following cycle 1 adjuvant Taxol/carboplatin 01/20/2015, no rash, resolved with a steroid dose  pack 8. Thrombocytopenia second to chemotherapy, the carboplatin was dose reduced with cycle 2 adjuvant Taxol/carboplatin 02/10/2015 9. Chemotherapy-induced peripheral neuropathy-painful peripheral neuropathy involving the feet 01/19/2016.  10. Admission with acute cholecystitis 08/07/2015, status post a cholecystectomy 08/09/2015 11. Admission 10/08/2015 with abdominal pain/constipation, improved with bowel rest and laxatives 12. Mild right hydronephrosis noted on the CT 10/07/2015. Renal ultrasound 12/16/2015 with no hydronephrosis noted.No hydronephrosis on CT 07/19/2016.   Disposition: She appears unchanged.  She is tolerating the Taxol/Avastin well.  The plan is to continue every 2-week chemotherapy.  We will follow-up on the CA 125 from today.  She will return for an office visit and chemotherapy in 2 weeks.  I suspect the cough is related to COPD.  15 minutes were spent with the patient today.  The majority of the time was used for counseling and coordination of care.  Betsy Coder, MD  09/26/2017  11:01 AM

## 2017-09-26 NOTE — Patient Instructions (Signed)
[facility] Discharge Instructions for Patients Receiving Chemotherapy  Today you received the following chemotherapy agents: Avastin & Paclitaxel.   To help prevent nausea and vomiting after your treatment, we encourage you to take your nausea medication as directed.  If you develop nausea and vomiting that is not controlled by your nausea medication, call the clinic.   BELOW ARE SYMPTOMS THAT SHOULD BE REPORTED IMMEDIATELY:  *FEVER GREATER THAN 100.5 F  *CHILLS WITH OR WITHOUT FEVER  NAUSEA AND VOMITING THAT IS NOT CONTROLLED WITH YOUR NAUSEA MEDICATION  *UNUSUAL SHORTNESS OF BREATH  *UNUSUAL BRUISING OR BLEEDING  TENDERNESS IN MOUTH AND THROAT WITH OR WITHOUT PRESENCE OF ULCERS  *URINARY PROBLEMS  *BOWEL PROBLEMS  UNUSUAL RASH Items with * indicate a potential emergency and should be followed up as soon as possible.  Feel free to call the clinic should you have any questions or concerns. The clinic phone number is [phone].  Please show the CHEMO ALERT CARD at check-in to the Emergency Department and triage nurse.   

## 2017-09-27 LAB — CA 125: CANCER ANTIGEN (CA) 125: 707 U/mL — AB (ref 0.0–38.1)

## 2017-10-01 ENCOUNTER — Other Ambulatory Visit: Payer: Self-pay | Admitting: Family Medicine

## 2017-10-01 DIAGNOSIS — C482 Malignant neoplasm of peritoneum, unspecified: Secondary | ICD-10-CM

## 2017-10-02 DIAGNOSIS — M170 Bilateral primary osteoarthritis of knee: Secondary | ICD-10-CM | POA: Diagnosis not present

## 2017-10-02 DIAGNOSIS — M25561 Pain in right knee: Secondary | ICD-10-CM | POA: Diagnosis not present

## 2017-10-02 DIAGNOSIS — M25562 Pain in left knee: Secondary | ICD-10-CM | POA: Diagnosis not present

## 2017-10-07 ENCOUNTER — Other Ambulatory Visit: Payer: Self-pay | Admitting: Oncology

## 2017-10-09 ENCOUNTER — Ambulatory Visit (HOSPITAL_COMMUNITY): Payer: Medicare Other | Attending: Cardiovascular Disease

## 2017-10-09 ENCOUNTER — Other Ambulatory Visit (HOSPITAL_COMMUNITY): Payer: Medicare Other

## 2017-10-09 ENCOUNTER — Other Ambulatory Visit: Payer: Self-pay

## 2017-10-09 DIAGNOSIS — Z79899 Other long term (current) drug therapy: Secondary | ICD-10-CM | POA: Diagnosis not present

## 2017-10-09 DIAGNOSIS — I2510 Atherosclerotic heart disease of native coronary artery without angina pectoris: Secondary | ICD-10-CM | POA: Insufficient documentation

## 2017-10-09 DIAGNOSIS — C799 Secondary malignant neoplasm of unspecified site: Secondary | ICD-10-CM | POA: Insufficient documentation

## 2017-10-09 DIAGNOSIS — C569 Malignant neoplasm of unspecified ovary: Secondary | ICD-10-CM | POA: Diagnosis not present

## 2017-10-09 DIAGNOSIS — I519 Heart disease, unspecified: Secondary | ICD-10-CM | POA: Insufficient documentation

## 2017-10-09 DIAGNOSIS — I428 Other cardiomyopathies: Secondary | ICD-10-CM

## 2017-10-09 DIAGNOSIS — I10 Essential (primary) hypertension: Secondary | ICD-10-CM | POA: Diagnosis not present

## 2017-10-09 DIAGNOSIS — E785 Hyperlipidemia, unspecified: Secondary | ICD-10-CM | POA: Insufficient documentation

## 2017-10-09 DIAGNOSIS — J449 Chronic obstructive pulmonary disease, unspecified: Secondary | ICD-10-CM | POA: Diagnosis not present

## 2017-10-10 ENCOUNTER — Inpatient Hospital Stay: Payer: Medicare Other

## 2017-10-10 ENCOUNTER — Encounter: Payer: Self-pay | Admitting: Nurse Practitioner

## 2017-10-10 ENCOUNTER — Telehealth: Payer: Self-pay | Admitting: *Deleted

## 2017-10-10 ENCOUNTER — Telehealth: Payer: Self-pay | Admitting: Nurse Practitioner

## 2017-10-10 ENCOUNTER — Inpatient Hospital Stay: Payer: Medicare Other | Attending: Nurse Practitioner | Admitting: Nurse Practitioner

## 2017-10-10 VITALS — BP 147/52 | HR 89 | Temp 97.7°F | Resp 20 | Ht 61.0 in | Wt 186.6 lb

## 2017-10-10 VITALS — BP 131/55 | HR 84

## 2017-10-10 DIAGNOSIS — Z5111 Encounter for antineoplastic chemotherapy: Secondary | ICD-10-CM | POA: Insufficient documentation

## 2017-10-10 DIAGNOSIS — Z905 Acquired absence of kidney: Secondary | ICD-10-CM | POA: Insufficient documentation

## 2017-10-10 DIAGNOSIS — J910 Malignant pleural effusion: Secondary | ICD-10-CM | POA: Insufficient documentation

## 2017-10-10 DIAGNOSIS — R188 Other ascites: Secondary | ICD-10-CM | POA: Diagnosis not present

## 2017-10-10 DIAGNOSIS — C786 Secondary malignant neoplasm of retroperitoneum and peritoneum: Secondary | ICD-10-CM | POA: Insufficient documentation

## 2017-10-10 DIAGNOSIS — C561 Malignant neoplasm of right ovary: Secondary | ICD-10-CM | POA: Insufficient documentation

## 2017-10-10 DIAGNOSIS — C569 Malignant neoplasm of unspecified ovary: Secondary | ICD-10-CM

## 2017-10-10 DIAGNOSIS — Z95828 Presence of other vascular implants and grafts: Secondary | ICD-10-CM

## 2017-10-10 DIAGNOSIS — C5700 Malignant neoplasm of unspecified fallopian tube: Secondary | ICD-10-CM

## 2017-10-10 DIAGNOSIS — Z79899 Other long term (current) drug therapy: Secondary | ICD-10-CM | POA: Diagnosis not present

## 2017-10-10 DIAGNOSIS — C7982 Secondary malignant neoplasm of genital organs: Secondary | ICD-10-CM | POA: Diagnosis not present

## 2017-10-10 DIAGNOSIS — J449 Chronic obstructive pulmonary disease, unspecified: Secondary | ICD-10-CM | POA: Insufficient documentation

## 2017-10-10 DIAGNOSIS — C562 Malignant neoplasm of left ovary: Secondary | ICD-10-CM | POA: Insufficient documentation

## 2017-10-10 DIAGNOSIS — C482 Malignant neoplasm of peritoneum, unspecified: Secondary | ICD-10-CM

## 2017-10-10 LAB — CMP (CANCER CENTER ONLY)
ALBUMIN: 3 g/dL — AB (ref 3.5–5.0)
ALK PHOS: 75 U/L (ref 40–150)
ALT: 10 U/L (ref 0–55)
ANION GAP: 8 (ref 3–11)
AST: 13 U/L (ref 5–34)
BUN: 17 mg/dL (ref 7–26)
CALCIUM: 9.8 mg/dL (ref 8.4–10.4)
CHLORIDE: 106 mmol/L (ref 98–109)
CO2: 29 mmol/L (ref 22–29)
Creatinine: 1.05 mg/dL (ref 0.60–1.10)
GFR, Est AFR Am: 59 mL/min — ABNORMAL LOW (ref >60)
GFR, Estimated: 51 mL/min — ABNORMAL LOW (ref >60)
GLUCOSE: 105 mg/dL (ref 70–140)
Potassium: 4.5 mmol/L (ref 3.5–5.1)
SODIUM: 143 mmol/L (ref 136–145)
Total Bilirubin: 0.3 mg/dL (ref 0.2–1.2)
Total Protein: 6.6 g/dL (ref 6.4–8.3)

## 2017-10-10 LAB — CBC WITH DIFFERENTIAL (CANCER CENTER ONLY)
BASOS PCT: 1 %
Basophils Absolute: 0 10*3/uL (ref 0.0–0.1)
Eosinophils Absolute: 0.2 10*3/uL (ref 0.0–0.5)
Eosinophils Relative: 4 %
HEMATOCRIT: 36.9 % (ref 34.8–46.6)
HEMOGLOBIN: 10.8 g/dL — AB (ref 11.6–15.9)
LYMPHS ABS: 0.9 10*3/uL (ref 0.9–3.3)
LYMPHS PCT: 21 %
MCH: 27.8 pg (ref 25.1–34.0)
MCHC: 29.3 g/dL — AB (ref 31.5–36.0)
MCV: 94.9 fL (ref 79.5–101.0)
MONO ABS: 0.5 10*3/uL (ref 0.1–0.9)
MONOS PCT: 12 %
NEUTROS ABS: 2.6 10*3/uL (ref 1.5–6.5)
Neutrophils Relative %: 62 %
Platelet Count: 353 10*3/uL (ref 145–400)
RBC: 3.89 MIL/uL (ref 3.70–5.45)
RDW: 18.7 % — ABNORMAL HIGH (ref 11.2–14.5)
WBC Count: 4.1 10*3/uL (ref 3.9–10.3)

## 2017-10-10 MED ORDER — DIPHENHYDRAMINE HCL 25 MG PO CAPS
ORAL_CAPSULE | ORAL | Status: AC
  Filled 2017-10-10: qty 1

## 2017-10-10 MED ORDER — DEXAMETHASONE SODIUM PHOSPHATE 10 MG/ML IJ SOLN
5.0000 mg | Freq: Once | INTRAMUSCULAR | Status: AC
  Administered 2017-10-10: 5 mg via INTRAVENOUS

## 2017-10-10 MED ORDER — DIPHENHYDRAMINE HCL 25 MG PO TABS
25.0000 mg | ORAL_TABLET | Freq: Once | ORAL | Status: AC
  Administered 2017-10-10: 25 mg via ORAL
  Filled 2017-10-10: qty 1

## 2017-10-10 MED ORDER — FAMOTIDINE IN NACL 20-0.9 MG/50ML-% IV SOLN
INTRAVENOUS | Status: AC
  Filled 2017-10-10: qty 50

## 2017-10-10 MED ORDER — SODIUM CHLORIDE 0.9 % IJ SOLN
10.0000 mL | INTRAMUSCULAR | Status: DC | PRN
  Administered 2017-10-10: 10 mL via INTRAVENOUS
  Filled 2017-10-10: qty 10

## 2017-10-10 MED ORDER — SODIUM CHLORIDE 0.9% FLUSH
10.0000 mL | INTRAVENOUS | Status: DC | PRN
Start: 2017-10-10 — End: 2017-10-10
  Administered 2017-10-10: 10 mL
  Filled 2017-10-10: qty 10

## 2017-10-10 MED ORDER — SODIUM CHLORIDE 0.9 % IV SOLN
80.0000 mg/m2 | Freq: Once | INTRAVENOUS | Status: AC
  Administered 2017-10-10: 150 mg via INTRAVENOUS
  Filled 2017-10-10: qty 25

## 2017-10-10 MED ORDER — SODIUM CHLORIDE 0.9 % IV SOLN
10.6000 mg/kg | Freq: Once | INTRAVENOUS | Status: AC
  Administered 2017-10-10: 900 mg via INTRAVENOUS
  Filled 2017-10-10: qty 32

## 2017-10-10 MED ORDER — SODIUM CHLORIDE 0.9 % IV SOLN
Freq: Once | INTRAVENOUS | Status: AC
Start: 2017-10-10 — End: 2017-10-10
  Administered 2017-10-10: 11:00:00 via INTRAVENOUS

## 2017-10-10 MED ORDER — HEPARIN SOD (PORK) LOCK FLUSH 100 UNIT/ML IV SOLN
500.0000 [IU] | Freq: Once | INTRAVENOUS | Status: AC | PRN
  Administered 2017-10-10: 500 [IU]
  Filled 2017-10-10: qty 5

## 2017-10-10 MED ORDER — FAMOTIDINE IN NACL 20-0.9 MG/50ML-% IV SOLN
20.0000 mg | Freq: Once | INTRAVENOUS | Status: AC
  Administered 2017-10-10: 20 mg via INTRAVENOUS

## 2017-10-10 MED ORDER — DEXAMETHASONE SODIUM PHOSPHATE 10 MG/ML IJ SOLN
INTRAMUSCULAR | Status: AC
  Filled 2017-10-10: qty 1

## 2017-10-10 NOTE — Patient Instructions (Signed)
Implanted Port Home Guide An implanted port is a type of central line that is placed under the skin. Central lines are used to provide IV access when treatment or nutrition needs to be given through a person's veins. Implanted ports are used for long-term IV access. An implanted port may be placed because:  You need IV medicine that would be irritating to the small veins in your hands or arms.  You need long-term IV medicines, such as antibiotics.  You need IV nutrition for a long period.  You need frequent blood draws for lab tests.  You need dialysis.  Implanted ports are usually placed in the chest area, but they can also be placed in the upper arm, the abdomen, or the leg. An implanted port has two main parts:  Reservoir. The reservoir is round and will appear as a small, raised area under your skin. The reservoir is the part where a needle is inserted to give medicines or draw blood.  Catheter. The catheter is a thin, flexible tube that extends from the reservoir. The catheter is placed into a large vein. Medicine that is inserted into the reservoir goes into the catheter and then into the vein.  How will I care for my incision site? Do not get the incision site wet. Bathe or shower as directed by your health care provider. How is my port accessed? Special steps must be taken to access the port:  Before the port is accessed, a numbing cream can be placed on the skin. This helps numb the skin over the port site.  Your health care provider uses a sterile technique to access the port. ? Your health care provider must put on a mask and sterile gloves. ? The skin over your port is cleaned carefully with an antiseptic and allowed to dry. ? The port is gently pinched between sterile gloves, and a needle is inserted into the port.  Only "non-coring" port needles should be used to access the port. Once the port is accessed, a blood return should be checked. This helps ensure that the port  is in the vein and is not clogged.  If your port needs to remain accessed for a constant infusion, a clear (transparent) bandage will be placed over the needle site. The bandage and needle will need to be changed every week, or as directed by your health care provider.  Keep the bandage covering the needle clean and dry. Do not get it wet. Follow your health care provider's instructions on how to take a shower or bath while the port is accessed.  If your port does not need to stay accessed, no bandage is needed over the port.  What is flushing? Flushing helps keep the port from getting clogged. Follow your health care provider's instructions on how and when to flush the port. Ports are usually flushed with saline solution or a medicine called heparin. The need for flushing will depend on how the port is used.  If the port is used for intermittent medicines or blood draws, the port will need to be flushed: ? After medicines have been given. ? After blood has been drawn. ? As part of routine maintenance.  If a constant infusion is running, the port may not need to be flushed.  How long will my port stay implanted? The port can stay in for as long as your health care provider thinks it is needed. When it is time for the port to come out, surgery will be   done to remove it. The procedure is similar to the one performed when the port was put in. When should I seek immediate medical care? When you have an implanted port, you should seek immediate medical care if:  You notice a bad smell coming from the incision site.  You have swelling, redness, or drainage at the incision site.  You have more swelling or pain at the port site or the surrounding area.  You have a fever that is not controlled with medicine.  This information is not intended to replace advice given to you by your health care provider. Make sure you discuss any questions you have with your health care provider. Document  Released: 06/26/2005 Document Revised: 12/02/2015 Document Reviewed: 03/03/2013 Elsevier Interactive Patient Education  2017 Elsevier Inc.  

## 2017-10-10 NOTE — Progress Notes (Signed)
[facility] OFFICE PROGRESS NOTE   Diagnosis: Ovarian cancer  INTERVAL HISTORY:   Lynn Ramirez returns as scheduled.  She completed another cycle of Taxol/Avastin 09/26/2017.  She has occasional nausea.  No vomiting.  Mouth is sore.  No ulcers.  Few episodes of constipation.  She takes Senokot as needed with good results.  Stable neuropathy symptoms.  No bleeding.  Dyspnea varies.  Cough is better, "about gone".  Objective:  Vital signs in last 24 hours:  Blood pressure (!) 147/52, pulse 89, temperature 97.7 F (36.5 C), temperature source Oral, resp. rate 20, height 5\' 1"  (1.549 m), weight 186 lb 9.6 oz (84.6 kg), SpO2 97 %.    HEENT: No thrush or ulcers. Resp: Distant breath sounds.  No respiratory distress. Cardio: Regular rate and rhythm. GI: Abdomen soft and nontender.  No hepatomegaly.  No apparent ascites. Vascular: Trace lower leg edema bilaterally.  Skin: No rash. Port-A-Cath without erythema.   Lab Results:  Lab Results  Component Value Date   WBC 4.1 10/10/2017   HGB 11.2 (L) 08/29/2017   HCT 36.9 10/10/2017   MCV 94.9 10/10/2017   PLT 353 10/10/2017   NEUTROABS 2.6 10/10/2017    Imaging:  No results found.  Medications: I have reviewed the patient's current medications.  Assessment/Plan: 1. 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, immunohistochemical profile consistent with a GYN primary, elevated CA 125   Staging CTs of the chest, abdomen, and pelvis on 10/06/2014 revealed a loculated right pleural effusion, ascites, and omental/mesenteric thickening   Cytology from peritoneal fluid 10/13/2014 revealed malignant cells consistent with metastatic adenocarcinoma Biopsy of an omental mass on 11/02/2014 revealed invasive serous carcinoma with psammoma  bodies Cycle 1 Taxol/carboplatin 10/28/2014 Cycle 2 Taxol/carboplatin 11/18/2014 Cycle 3 Taxol/carboplatin 12/09/2014  CT scan 12/23/2014 with interval improvement in peritoneal carcinomatosis with near-complete resolution of ascites and decreased omental nodularity. Significant improvement in malignant right pleural effusion.  Status post robotic-assisted laparoscopic hysterectomy with bilateral salpingoophorectomy, omentectomy, radical tumor debulking 12/29/2014. Per Dr. Serita Grit office note 01/14/2015 cytoreduction was optimal with residual disease remaining only in the 1 mm implants on the small intestine. 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 Cycle 1 adjuvant Taxol/carboplatin 01/20/2015 Cycle 2 adjuvant Taxol/carboplatin 02/10/2015 Cycle 3 adjuvant Taxol/carboplatin 03/10/2015 CA 125 on 1013 2016-42  CTs of the chest, abdomen, and pelvis 05/31/2015 with no evidence of progressive ovarian cancer  CTs of the chest, abdomen, and pelvis 08/07/2015 and 08/08/2015-no evidence of progressive ovarian cancer  Peritoneal studding noted at the time of the cholecystectomy procedure 08/09/2015 Elevated CA 125 10/07/2015  CT 10/07/2015 with stricturing at the sigmoid colon, constipation, and omental nodularity  Initiation of salvage weekly Taxol chemotherapy 10/13/2015  Taxol changed to every 2 weeks beginning 01/19/2016 due to painful neuropathy.  CT scans 07/19/2016 with no acute findings. No features in the abdomen or pelvis to suggest recurrent disease.Stable mild fullness right intrarenal collecting system.  Elevated CA 125 treatment resumed with Taxol/Avastin on a 2 week schedule 09/27/2016  CT  02/12/2017-no evidence of carcinomatosis, no evidence of progressive metastatic disease  Taxol/Avastin continued every 2 weeks  CT 09/10/2017- increase in trace ascites, no evidence of progressive carcinomatosis, inflammatory changes at the lung bases with a new 3 mm right lower lobe  nodule  Taxol/Avastin continued every 2 weeks 2. COPD-followed by Dr. Lamonte Sakai. 3. Dyspnea secondary to COPD and the large right pleural effusion, status post therapeutic thoracentesis procedures 09/30/2014,10/09/2014, and 10/21/2014. Left thoracentesis 10/30/2014 4. Left nephrectomy as a child 5. Delayed nausea following cycle 1 Taxol/carboplatin, Aloxi/Emend added with cycle 2 with improvement. 6. Right lower extremity edema, right calf pain 12/09/2014. Negative venous Doppler 12/09/2014. 7. Diffuse pruritus following cycle 1 adjuvant Taxol/carboplatin 01/20/2015, no rash, resolved with a steroid dose pack 8. Thrombocytopenia second to chemotherapy, the carboplatin was dose reduced with cycle 2 adjuvant Taxol/carboplatin 02/10/2015 9. Chemotherapy-induced peripheral neuropathy-painful peripheral neuropathy involving the feet 01/19/2016.  10. Admission with acute cholecystitis 08/07/2015, status post a cholecystectomy 08/09/2015 11. Admission 10/08/2015 with abdominal pain/constipation, improved with bowel rest and laxatives 12. Mild right hydronephrosis noted on the CT 10/07/2015. Renal ultrasound 12/16/2015 with no hydronephrosis noted.No hydronephrosis on CT 07/19/2016.   Disposition: Lynn Ramirez appears unchanged.  Plan to continue every 2-week Taxol/Avastin.  We will follow-up on the CA 125 from today.  She will return for lab, follow-up and Taxol/Avastin in 2 weeks.  She will contact the office in the interim with any problems.    Ned Card ANP/GNP-BC   10/10/2017  10:27 AM

## 2017-10-10 NOTE — Telephone Encounter (Signed)
Scheduled appt pr 4/3 los - Gave patient AVS and calender per los.

## 2017-10-10 NOTE — Telephone Encounter (Signed)
Called and scheduled the patient to see Dr. Denman George on May 10th at 3:15pm. Per staff message

## 2017-10-10 NOTE — Patient Instructions (Signed)
[facility] Discharge Instructions for Patients Receiving Chemotherapy  Today you received the following chemotherapy agents: Avastin & Paclitaxel.   To help prevent nausea and vomiting after your treatment, we encourage you to take your nausea medication as directed.  If you develop nausea and vomiting that is not controlled by your nausea medication, call the clinic.   BELOW ARE SYMPTOMS THAT SHOULD BE REPORTED IMMEDIATELY:  *FEVER GREATER THAN 100.5 F  *CHILLS WITH OR WITHOUT FEVER  NAUSEA AND VOMITING THAT IS NOT CONTROLLED WITH YOUR NAUSEA MEDICATION  *UNUSUAL SHORTNESS OF BREATH  *UNUSUAL BRUISING OR BLEEDING  TENDERNESS IN MOUTH AND THROAT WITH OR WITHOUT PRESENCE OF ULCERS  *URINARY PROBLEMS  *BOWEL PROBLEMS  UNUSUAL RASH Items with * indicate a potential emergency and should be followed up as soon as possible.  Feel free to call the clinic should you have any questions or concerns. The clinic phone number is [phone].  Please show the CHEMO ALERT CARD at check-in to the Emergency Department and triage nurse.   

## 2017-10-11 LAB — CA 125: Cancer Antigen (CA) 125: 1035 U/mL — ABNORMAL HIGH (ref 0.0–38.1)

## 2017-10-12 ENCOUNTER — Telehealth: Payer: Self-pay | Admitting: Nurse Practitioner

## 2017-10-12 NOTE — Telephone Encounter (Signed)
I notified Lynn Ramirez of the CA 125 result from 10/10/2017.  She will follow-up as scheduled.

## 2017-10-15 ENCOUNTER — Ambulatory Visit (INDEPENDENT_AMBULATORY_CARE_PROVIDER_SITE_OTHER): Payer: Medicare Other | Admitting: Emergency Medicine

## 2017-10-15 ENCOUNTER — Encounter: Payer: Self-pay | Admitting: Emergency Medicine

## 2017-10-15 VITALS — BP 122/68 | HR 82 | Ht 61.0 in | Wt 181.0 lb

## 2017-10-15 DIAGNOSIS — R0602 Shortness of breath: Secondary | ICD-10-CM | POA: Diagnosis not present

## 2017-10-15 DIAGNOSIS — R05 Cough: Secondary | ICD-10-CM | POA: Diagnosis not present

## 2017-10-15 DIAGNOSIS — I25119 Atherosclerotic heart disease of native coronary artery with unspecified angina pectoris: Secondary | ICD-10-CM | POA: Diagnosis not present

## 2017-10-15 DIAGNOSIS — R053 Chronic cough: Secondary | ICD-10-CM

## 2017-10-15 DIAGNOSIS — R911 Solitary pulmonary nodule: Secondary | ICD-10-CM | POA: Diagnosis not present

## 2017-10-15 DIAGNOSIS — R918 Other nonspecific abnormal finding of lung field: Secondary | ICD-10-CM | POA: Diagnosis not present

## 2017-10-15 DIAGNOSIS — C799 Secondary malignant neoplasm of unspecified site: Secondary | ICD-10-CM | POA: Diagnosis not present

## 2017-10-15 NOTE — Assessment & Plan Note (Signed)
Continue loratadine as ordered 

## 2017-10-15 NOTE — Assessment & Plan Note (Signed)
CT chest given her R basilar findings on CT abd

## 2017-10-15 NOTE — Assessment & Plan Note (Signed)
  Continue your oxygen at all times as you are using it.

## 2017-10-15 NOTE — Assessment & Plan Note (Signed)
Continue nexium as ordered

## 2017-10-15 NOTE — Progress Notes (Signed)
Subjective:    Patient ID: Lynn Ramirez, female    DOB: 1941/04/01, 77 y.o.   MRN: [id number]  HPI 77 yo woman, has been followed by Dr Gwenette Greet for COPD, pleural effusions in setting of chemotherapy for serous carcinoma possibly Fallopian. History is taken from the patient and from her husband. She is following with Dr Benay Spice, Dr Denman George. She has been tired, is having some neuropathy. Her breathing is stable. She is on spiriva and symbicort. I personally reviewed her pulmonary function testing from 05/06/2012. This showed severe obstructive lung disease without bronchodilator response.   ROV 07/16/17 --pleasant 77 year old woman with ovarian cancer, associated pleural effusions, significant COPD with associated upper airway irritation and chronic cough.  She has hypoxemic respiratory failure and is on 2 L/min.  She has had a couple of URI's since last time, and her cough has persisted after. She is currently having hoarse voice, is coughing most days, minimally productive. She is on [employer] and Arnuity. She is off nexium, on pepcid. She is on loratadine. She noticed some intermittent L thigh pain - fell recently. She also was on a car ride from [place]. No calf pain.   ROV 10/15/17 --this is a follow-up visit for patient with a history of ovarian cancer with associated pleural effusions, significant COPD with chronic cough and associated upper airway irritation syndrome.  She is on 2 L/min oxygen.  She was seen here 1 month ago and treated with Delsym, Mucinex, Tessalon, doxycycline for flaring of her coughing.  A chest x-ray was done which I reviewed.  This showed mild basilar atelectatic changes without any significant infiltrates.  She returns today reporting that her cough did finally improve. Her CA-125 has been rising on chemo. She had a CT abd that showed some increased basilar RLL atx / infiltrate. She is on arnuity  and stiolto, loratadine, nexium. She is having some low back pain, started today.    Review of Systems  As per HPI     Objective:   Physical Exam Vitals:   10/15/17 1157 10/15/17 1158  BP:  122/68  Pulse:  82  SpO2:  97%  Weight: 181 lb (82.1 kg)   Height: 5\' 1"  (1.549 m)    Gen: Pleasant, well-nourished, in no distress,  normal affect  ENT: No lesions,  mouth clear,  oropharynx clear, no postnasal drip, hoarse voice  Neck: No JVD, no stridor,   Lungs: No use of accessory muscles, no wheezing  Cardiovascular: RRR, heart sounds normal, no murmur or gallops, no peripheral edema  Musculoskeletal: No deformities, no cyanosis or clubbing  Neuro: alert, non focal  Skin: Warm, no lesions or rashes      Assessment & Plan:  COPD  GOLD C Please continue Arnuity Ellipta and Stiolto as you have been taking them.   Chronic respiratory failure with hypoxia (HCC)  Continue your oxygen at all times as you are using it.  GERD (gastroesophageal reflux disease) Continue nexium as ordered  Allergic rhinitis  Continue loratadine as ordered  Chronic cough Treat rhinitis and GERD. ? Whether there is a component of metastatic disease here. Note increased RLL nodular infiltrate / atx. Will perform CT chest to better evaluate pulm parenchyma, review with Dr Benay Spice  Metastatic adenocarcinoma CT chest given her R basilar findings on CT abd  Baltazar Apo, MD, PhD 10/15/2017, 12:24 PM [facility] [phone] or if no answer [phone]

## 2017-10-15 NOTE — Assessment & Plan Note (Signed)
Please continue Arnuity Ellipta and Stiolto as you have been taking them.

## 2017-10-15 NOTE — Patient Instructions (Signed)
Please continue Arnuity Ellipta and Stiolto as you have been taking them. Continue loratadine 10 mg daily. Continue Nexium 40 mg daily. We will perform a CT scan of your chest without contrast to evaluate for any evidence of pulmonary nodules or new infiltrates. Continue your oxygen at all times as you are using it. Follow with Dr Lamonte Sakai next available to review your CT scan results together.

## 2017-10-15 NOTE — Assessment & Plan Note (Signed)
Treat rhinitis and GERD. ? Whether there is a component of metastatic disease here. Note increased RLL nodular infiltrate / atx. Will perform CT chest to better evaluate pulm parenchyma, review with Dr Benay Spice

## 2017-10-16 ENCOUNTER — Telehealth: Payer: Self-pay

## 2017-10-16 ENCOUNTER — Ambulatory Visit (INDEPENDENT_AMBULATORY_CARE_PROVIDER_SITE_OTHER)
Admission: RE | Admit: 2017-10-16 | Discharge: 2017-10-16 | Disposition: A | Discharge status: Home / Self Care | Payer: Medicare Other | Source: Ambulatory Visit | Attending: Emergency Medicine | Admitting: Emergency Medicine

## 2017-10-16 ENCOUNTER — Ambulatory Visit (INDEPENDENT_AMBULATORY_CARE_PROVIDER_SITE_OTHER): Payer: Medicare Other | Admitting: Cardiology

## 2017-10-16 ENCOUNTER — Telehealth: Payer: Self-pay | Admitting: Oncology

## 2017-10-16 ENCOUNTER — Encounter: Payer: Self-pay | Admitting: Cardiology

## 2017-10-16 VITALS — BP 110/82 | HR 85 | Ht 61.0 in | Wt 181.4 lb

## 2017-10-16 DIAGNOSIS — I2510 Atherosclerotic heart disease of native coronary artery without angina pectoris: Secondary | ICD-10-CM

## 2017-10-16 DIAGNOSIS — Z79899 Other long term (current) drug therapy: Secondary | ICD-10-CM

## 2017-10-16 DIAGNOSIS — R918 Other nonspecific abnormal finding of lung field: Secondary | ICD-10-CM

## 2017-10-16 DIAGNOSIS — I428 Other cardiomyopathies: Secondary | ICD-10-CM | POA: Diagnosis not present

## 2017-10-16 MED ORDER — FUROSEMIDE 20 MG PO TABS: 20.0000 mg | ORAL_TABLET | Freq: Every day | ORAL | 4 refills | Status: DC | PRN

## 2017-10-16 NOTE — Patient Instructions (Signed)
Medication Instructions:   STOP TAKING MAXIDE NOW  START TAKING LASIX 20 MG BY MOUTH DAILY ONLY AS NEEDED FOR LOWER EXTREMITY SWELLING     Follow-Up:  4 MONTHS WITH DR Meda Coffee       If you need a refill on your cardiac medications before your next appointment, please call your pharmacy.

## 2017-10-16 NOTE — Progress Notes (Signed)
Cardiology Office Note    Date:  10/16/2017  ID:  Lynn Ramirez, DOB [birth year], MRN [id number] PCP:  Midge Minium, MD  Cardiologist:  Dr. Meda Coffee   Chief Complaint: 3 months follow-up  History of Present Illness:  Lynn Ramirez is a 77 y.o. 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 She has had prior malignant right pleural effusion c/w GYN primary, thus has been treated by oncology for metastatic ovarian cancer and peritoneal carcinomatosis. She has undergone laparoscopic hysterectomy with bilateral salpingoophorectomy, omentectomy, radical tumor debulking and is now treated with Taxol and Avastin. She has been followed by Dr. Meda Coffee for her history of abnormal calcifications seen on a CT scan in 2016. Lexiscan nuclear stress test 04/2015 did not show any significant ischemia, only fixed defect in apex location and breat attenuation. She also has had issues with HTN while on Avastin.   04/26/2017 - patient states that she feels about the same, she continues to use home oxygen via nasal cannula 24/7. Earlier disease year she was taken away from her chemotherapy for 2 months to get break from side effects, she is being followed by Dr. Learta Codding. He mention in his note that she may be a candidate for repeat treatment with carboplatin. She continues treatment with Taxol/ Avastin, last cycle completed on 04/11/2017.  07/23/2017 - 3 months follow-up, the patient states that her symptoms have been stable, she is on home oxygen, and has stable dyspnea on exertion she underwent an echocardiogram that showed improved LVEF and global longitudinal strain, she denies any lower extremity edema orthopnea paroxysmal nocturnal dyspnea. However she states that she is getting more and more  tired of chemotherapy and feels like she might not tolerate it for too much longer.  10/16/2017 - this is 3 months follow-up, the patient is doing well from cardiac standpoint, denies any chest pain, she is stable shortness of breath, she gets intermittent lower extremity edema day or 2 after chemotherapy that is responsive to low-dose of Maxide, she denies any orthopnea or proximal nocturnal dyspnea, she is overall just tired from years of treatment and hoping to be able spend summer at the beach with her grandchildren.  2D echo: 10/2016 showed EF 55-60%, grade 1 DD, indeterminate LV filling pressure 01/03/17: 55-60%, global longitudinal strain -16% 04/19/17: 50-55%, global longitudinal strain -16% 07/2017: LVEF 60-65%, global longitudinal strain -20% 10/09/2017: LVEF 60-65%, no strain reported  Past Medical History:  Diagnosis Date  . CKD (chronic kidney disease), stage II   . COPD (chronic obstructive pulmonary disease) ([place])   . Coronary artery calcification seen on CT scan   . Difficulty sleeping   . Eczema    hands  . Emphysema   . GERD (gastroesophageal reflux disease)   . H/O hydronephrosis   . History of transfusion    age 44  . Hyperlipidemia   . Hypertension   . Ovarian cancer ([place]) dx'd 09/2014   metastatic - prior malignant R pleural effusion and peritoneal carcinomatosis  . S/p nephrectomy     Past Surgical History:  Procedure Laterality Date  . ABDOMINAL HYSTERECTOMY    . CHOLECYSTECTOMY N/A 08/09/2015   Procedure: Attempted LAPAROSCOPIC coverted  open CHOLECYSTECTOMY WITH INTRAOPERATIVE CHOLANGIOGRAM;  Surgeon: Autumn Messing III, MD;  Location: [facility];  Service: General;  Laterality: N/A;  .  HAND SURGERY Right 1980's  . LAPAROTOMY N/A 12/29/2014   Procedure:  LAPAROTOMY;  Surgeon: Everitt Amber, MD;  Location: [facility];  Service: Gynecology;  Laterality: N/A;  . NEPHRECTOMY    . ROBOTIC ASSISTED TOTAL HYSTERECTOMY WITH BILATERAL SALPINGO OOPHERECTOMY Bilateral 12/29/2014    Procedure: ROBOTIC ASSISTED TOTAL LAPAROSCOPIC HYSTERECTOMY WITH BILATERAL SALPINGO OOPHORECTOMY AND OOMENTECTOMY WITH RADICAL TUMOR [place] ;  Surgeon: Everitt Amber, MD;  Location: [facility];  Service: Gynecology;  Laterality: Bilateral;  . THORACENTESIS     several  . TONSILLECTOMY    . TUBAL LIGATION      Current Medications: Current Meds  Medication Sig  . Acetaminophen (TYLENOL) 325 MG CAPS Take 1 tablet by mouth 3 (three) times daily as needed (pain).   Marland Kitchen albuterol (PROVENTIL) (2.5 MG/3ML) 0.083% nebulizer solution Take 3 mLs (2.5 mg total) by nebulization every 6 (six) hours as needed for wheezing or shortness of breath.  Marland Kitchen antiseptic oral rinse (BIOTENE) LIQD 15 mLs by Mouth Rinse route 3 (three) times daily.  . ARNUITY ELLIPTA 200 MCG/ACT AEPB INHALE 1 PUFF INTO THE LUNGS DAILY  . atorvastatin (LIPITOR) 10 MG tablet TAKE 1 TABLET(10 MG) BY MOUTH DAILY  . benzonatate (TESSALON) 200 MG capsule Take 1 capsule (200 mg total) by mouth 3 (three) times daily as needed for cough.  . Biotin 2500 MCG CAPS Take 1 tablet by mouth daily.  . carvedilol (COREG) 3.125 MG tablet TAKE 1 TABLET(3.125 MG) BY MOUTH TWICE DAILY WITH A MEAL  . cholecalciferol (VITAMIN D) 1000 units tablet Take 1,000 Units by mouth daily.  . diphenoxylate-atropine (LOMOTIL) 2.5-0.025 MG tablet TAKE 2 TABLETS BY MOUTH FOUR TIMES DAILY AS NEEDED FOR DIARRHEA/ LOOSE STOOLS  . esomeprazole (NEXIUM) 40 MG capsule Take 1 capsule (40 mg total) by mouth daily.  . fluocinonide cream (LIDEX) 8.11 % Apply 1 application topically 2 (two) times daily as needed (For ezcema).   . gabapentin (NEURONTIN) 300 MG capsule TAKE 1 CAPSULE BY MOUTH EVERY NIGHT AT BEDTIME  . lidocaine-prilocaine (EMLA) cream Apply to port site one hour prior to use. Do not rub in. Cover with plastic.  Marland Kitchen loratadine (CLARITIN) 10 MG tablet Take 10 mg by mouth daily.  . ondansetron (ZOFRAN-ODT) 4 MG disintegrating tablet Take 1 tablet (4 mg total) by mouth every 8  (eight) hours as needed for nausea or vomiting.  Marland Kitchen oxyCODONE-acetaminophen (PERCOCET/ROXICET) 5-325 MG tablet Take 1 tablet by mouth every 6 (six) hours as needed for severe pain.  Marland Kitchen PROAIR HFA 108 (90 Base) MCG/ACT inhaler INHALE 2 PUFFS INTO THE LUNGS FOUR TIMES DAILY AS NEEDED FOR WHEEZING  . pyridoxine (B-6) 100 MG tablet Take 100 mg by mouth daily.  . sorbitol 70 % solution Take 30 cc twice daily until bowel movement then once daily  . STIOLTO RESPIMAT 2.5-2.5 MCG/ACT AERS INHALE 2 PUFFS INTO THE LUNGS DAILY  . traMADol (ULTRAM) 50 MG tablet Take 1 tablet (50 mg total) by mouth every 12 (twelve) hours as needed. for pain  . triamterene-hydrochlorothiazide (MAXZIDE-25) 37.5-25 MG tablet TAKE HALF(1/2) TO ONE(1) TABLET EVERY DAY AS DIRECTED  . VIRTUSSIN A/C 100-10 MG/5ML syrup TAKE 10 ML BY MOUTH THREE TIMES DAILY AS NEEDED FOR COUGH  . vitamin B-12 (CYANOCOBALAMIN) 100 MCG tablet Take 100 mcg by mouth daily. Reported on 01/19/2016  . zolpidem (AMBIEN) 5 MG tablet Take 1 tablet (5 mg total) by mouth at bedtime as needed for sleep.    Allergies:   Latex and Penicillins   Social  History   Socioeconomic History  . Marital status: Married    Spouse name: Not on file  . Number of children: 3  . Years of education: Not on file  . Highest education level: Not on file  Occupational History  . Occupation: RETIRED    Employer: RETIRED  Social Needs  . Financial resource strain: Not on file  . Food insecurity:    Worry: Not on file    Inability: Not on file  . Transportation needs:    Medical: Not on file    Non-medical: Not on file  Tobacco Use  . Smoking status: Former Smoker    Packs/day: 1.00    Years: 40.00    Pack years: 40.00    Types: Cigarettes    Last attempt to quit: 07/10/2013    Years since quitting: 4.2  . Smokeless tobacco: Never Used  Substance and Sexual Activity  . Alcohol use: Yes    Alcohol/week: 0.0 oz    Comment: 1-2 a week  . Drug use: No  . Sexual activity:  Not on file  Lifestyle  . Physical activity:    Days per week: Not on file    Minutes per session: Not on file  . Stress: Not on file  Relationships  . Social connections:    Talks on phone: Not on file    Gets together: Not on file    Attends religious service: Not on file    Active member of club or organization: Not on file    Attends meetings of clubs or organizations: Not on file    Relationship status: Not on file  Other Topics Concern  . Not on file  Social History Narrative  . Not on file    Family History:  Family History  Problem Relation Age of Onset  . Diabetes Father   . Lung cancer Father 62       metastasis to liver  . Cancer Father        liver  . Pancreatic cancer Mother 56  . Stroke Paternal Grandfather   . Heart disease Maternal Aunt   . Diabetes Paternal Uncle   . Heart Problems Maternal Grandmother   . Heart Problems Maternal Grandfather   . Diabetes Paternal Grandmother   . Stroke Paternal Grandmother   . Heart Problems Paternal Uncle   . Cancer Cousin        unknown type  . Breast cancer Cousin        dx. 38s  . Leukemia Cousin        dx. 16-17  . Colon cancer Neg Hx   . Stomach cancer Neg Hx    ROS:   Please see the history of present illness. Has rare nosebleeds after chemo but no significant dripping - only minimal blood when wiping that resolves quickly (has been discussed with oncology) All other systems are reviewed and otherwise negative.   PHYSICAL EXAM:   VS:  BP 110/82   Pulse 85   Ht 5\' 1"  (1.549 m)   Wt 181 lb 6.4 oz (82.3 kg)   SpO2 95%   BMI 34.28 kg/m   BMI: Body mass index is 34.28 kg/m. GEN: Well nourished, well developed obese WF, in no acute distress , on O2 via [place] HEENT: normocephalic, atraumatic Neck: no JVD, carotid bruits, or masses Cardiac: RRR; no murmurs, rubs, or gallops, no edema  Respiratory:  Diffusely diminished but no wheezing, rales or rhonchi, normal work of breathing GI: soft, nontender,  nondistended, + BS MS: no deformity or atrophy  Skin: warm and dry, no rash Neuro:  Alert and Oriented x 3, Strength and sensation are intact, follows commands Psych: euthymic mood, full affect  Wt Readings from Last 3 Encounters:  10/16/17 181 lb 6.4 oz (82.3 kg)  10/15/17 181 lb (82.1 kg)  10/10/17 186 lb 9.6 oz (84.6 kg)    Studies/Labs Reviewed:   EKG:  EKG was not done today  Recent Labs: 10/18/2016: B Natriuretic Peptide 32.5 08/29/2017: Hemoglobin 11.2 10/10/2017: ALT 10; BUN 17; Creatinine 1.05; Platelet Count 353; Potassium 4.5; Sodium 143   Lipid Panel    Component Value Date/Time   CHOL 143 08/03/2017 1118   CHOL 135 12/25/2016 0957   TRIG 282.0 (H) 08/03/2017 1118   HDL 26.70 (L) 08/03/2017 1118   HDL 33 (L) 12/25/2016 0957   CHOLHDL 5 08/03/2017 1118   VLDL 56.4 (H) 08/03/2017 1118   LDLCALC 78 12/25/2016 0957   LDLDIRECT 70.0 08/03/2017 1118    Additional studies/ records that were reviewed today include: Summarized above  EKG performed today 07/23/2017 showed normal sinus rhythm rightward axis, unchanged from prior this was personally reviewed.  10/09/2017  - Left ventricle: The cavity size was normal. Systolic function was   normal. The estimated ejection fraction was in the range of 60%   to 65%. Wall motion was normal; there were no regional wall   motion abnormalities. Doppler parameters are consistent with   abnormal left ventricular relaxation (grade 1 diastolic   dysfunction). Doppler parameters are consistent with high   ventricular filling pressure. - Aortic valve: Transvalvular velocity was within the normal range.   There was no stenosis. There was no regurgitation. - Mitral valve: Transvalvular velocity was within the normal range.   There was no evidence for stenosis. There was trivial   regurgitation. - Right ventricle: The cavity size was normal. Wall thickness was   normal. Systolic function was normal. - Tricuspid valve: There was no  regurgitation. - Pericardium, extracardiac: A trivial pericardial effusion was   identified.    ASSESSMENT & PLAN:   1. Nonischemic cardiomyopathy - secondary to chemotherapy, she has been on Taxol/ Avastin every other week, LVEF normal 60-65 %, with normal strain, the last echo on 10/09/2017.we will start Lasix 20 mg by mouth daily when necessary for lower extremity edema, she is also advised to use OTC magnesium PRN for lower extremity cramping. 2. Her oncologist is possibly going to change her chemotherapy, we will see her back in 4 months, decide if we need to repeat echo at that time. 3. Coronary calcification by CT - no recent anginal symptoms. Lipids controlled. Prior low risk nuc. Continue surveillance for symptoms. She will continue taking atorvastatin that she is tolerating well.most recent lipids elevated however she wasn't fasting. 4. Essential HTN - controlled. 5. Hyperlipidemia - on atorvastatin that is tolerated well. 6.  Ovarian cancer with metastasis currently on chemotherapy with Avastin and Taxol - management as above.   Disposition: F/u with Dr. Meda Coffee in 4 months.  Medication Adjustments/Labs and Tests Ordered: Current medicines are reviewed at length with the patient today.  Concerns regarding medicines are outlined above. Medication changes, Labs and Tests ordered today are summarized above and listed in the Patient Instructions accessible in Encounters.   Signed, Ena Dawley, MD  10/16/2017 10:55 AM    [address], [place], [place]  [address] Phone: [phone]; Fax: [phone]

## 2017-10-16 NOTE — Telephone Encounter (Signed)
Returned call to pt. Pt reports that she received results on her CA-125 and was requesting to be seen earlier by MD. Damaris Schooner to Dr. Benay Spice and can see pt tomorrow. Notified pt, message to scheduling.

## 2017-10-16 NOTE — Telephone Encounter (Signed)
Scheduled appt per 4/9 sch msg - spoke with patient regarding appts.

## 2017-10-17 ENCOUNTER — Inpatient Hospital Stay (HOSPITAL_BASED_OUTPATIENT_CLINIC_OR_DEPARTMENT_OTHER): Payer: Medicare Other | Admitting: Oncology

## 2017-10-17 ENCOUNTER — Telehealth: Payer: Self-pay | Admitting: Oncology

## 2017-10-17 VITALS — BP 142/62 | HR 86 | Temp 97.6°F | Resp 17 | Ht 61.0 in | Wt 181.8 lb

## 2017-10-17 DIAGNOSIS — Z5111 Encounter for antineoplastic chemotherapy: Secondary | ICD-10-CM | POA: Diagnosis not present

## 2017-10-17 DIAGNOSIS — Z79899 Other long term (current) drug therapy: Secondary | ICD-10-CM | POA: Diagnosis not present

## 2017-10-17 DIAGNOSIS — C786 Secondary malignant neoplasm of retroperitoneum and peritoneum: Secondary | ICD-10-CM

## 2017-10-17 DIAGNOSIS — C7982 Secondary malignant neoplasm of genital organs: Secondary | ICD-10-CM | POA: Diagnosis not present

## 2017-10-17 DIAGNOSIS — C562 Malignant neoplasm of left ovary: Secondary | ICD-10-CM

## 2017-10-17 DIAGNOSIS — J910 Malignant pleural effusion: Secondary | ICD-10-CM

## 2017-10-17 DIAGNOSIS — C561 Malignant neoplasm of right ovary: Secondary | ICD-10-CM

## 2017-10-17 DIAGNOSIS — C5700 Malignant neoplasm of unspecified fallopian tube: Secondary | ICD-10-CM

## 2017-10-17 DIAGNOSIS — C569 Malignant neoplasm of unspecified ovary: Secondary | ICD-10-CM

## 2017-10-17 NOTE — Progress Notes (Signed)
[place] OFFICE PROGRESS NOTE   Diagnosis: Fallopian tube carcinoma  INTERVAL HISTORY:   [name] returns prior to his scheduled visit.  She completed another treatment with Taxol and Avastin last week.  She reports malaise.  The cough has resolved.  A CT of the chest on 10/16/2017 revealed tiny nodular lung lesions, no pleural effusion, and mild ascites adjacent to the liver. The CA 125 was higher last week.  She is concerned about progression of the ovarian cancer and would like to discuss alternate treatment options. Constipation is relieved with laxative regimen.  She reports pain at the right posterior iliac region for the past few days.  She continues to have bilateral knee pain and neuropathy symptoms in the evening.    Objective:  Vital signs in last 24 hours:  Blood pressure (!) 142/62, pulse 86, temperature 97.6 F (36.4 C), temperature source Oral, resp. rate 17, height 5' 1" (1.549 m), weight 181 lb 12.8 oz (82.5 kg), SpO2 98 %.    HEENT: No thrush Resp: Distant breath sounds, no respiratory distress Cardio: Regular rate and rhythm GI: No hepatomegaly, no apparent ascites, nontender Vascular: No leg edema Musculoskeletal: Tender at the right posterior iliac, no mass or rash.  No pain with motion of the right hip  Portacath/PICC-without erythema  Lab Results:  Lab Results  Component Value Date   WBC 4.1 10/10/2017   HGB 11.2 (L) 08/29/2017   HCT 36.9 10/10/2017   MCV 94.9 10/10/2017   PLT 353 10/10/2017   NEUTROABS 2.6 10/10/2017    CMP     Component Value Date/Time   NA 143 10/10/2017 0915   NA 137 07/04/2017 1122   K 4.5 10/10/2017 0915   K 4.1 07/04/2017 1122   CL 106 10/10/2017 0915   CO2 29 10/10/2017 0915   CO2 30 (H) 07/04/2017 1122   GLUCOSE 105 10/10/2017 0915   GLUCOSE 104 07/04/2017 1122   BUN 17 10/10/2017 0915   BUN 15.8 07/04/2017 1122   CREATININE 1.05 10/10/2017 0915   CREATININE 0.9 07/04/2017 1122   CALCIUM  9.8 10/10/2017 0915   CALCIUM 9.4 07/04/2017 1122   PROT 6.6 10/10/2017 0915   PROT 7.0 07/04/2017 1122   ALBUMIN 3.0 (L) 10/10/2017 0915   ALBUMIN 3.4 (L) 07/04/2017 1122   AST 13 10/10/2017 0915   AST 13 07/04/2017 1122   ALT 10 10/10/2017 0915   ALT 10 07/04/2017 1122   ALKPHOS 75 10/10/2017 0915   ALKPHOS 73 07/04/2017 1122   BILITOT 0.3 10/10/2017 0915   BILITOT 0.49 07/04/2017 1122   GFRNONAA 51 (L) 10/10/2017 0915   GFRAA 59 (L) 10/10/2017 0915   CA 120 5143 2019-1,035  Imaging:  Ct Chest Wo Contrast  Result Date: 10/16/2017 CLINICAL DATA:  Pulmonary nodule EXAM: CT CHEST WITHOUT CONTRAST TECHNIQUE: Multidetector CT imaging of the chest was performed following the standard protocol without IV contrast. COMPARISON:  CT abdomen and pelvis including lung bases September 10, 2017; chest radiograph September 14, 2017 FINDINGS: Cardiovascular: There is no appreciable thoracic aortic aneurysm or dissection. There are foci of atherosclerotic calcification in the proximal visualized great vessels. There is atherosclerotic calcification in the aorta as well as foci of coronary artery calcification. There is no pericardial effusion or pericardial thickening. Port-A-Cath tip is in the superior vena cava. Mediastinum/Nodes: Thyroid appears unremarkable. There are multiple subcentimeter mediastinal and axillary lymph nodes. By size criteria, there is no appreciable adenopathy on this study. No esophageal lesions are evident.  Lungs/Pleura: There is underlying centrilobular emphysematous change. There is mild scarring in the left upper lobe. On axial slice 67 series 3, there is a 3 mm nodular opacity in the posterior segment of the left upper lobe. On axial slice 67 series 3, there is a 3 mm nodular opacity in the posterior segment of the left upper lobe. On axial slice 68 series 3, there is a 3 mm nodular opacity in the anterior segment of the right upper lobe. On axial slice 98 series 3, there is a 4 mm nodular  opacity in the lateral segment of the right lower lobe. On axial slice 104 series 3, there is a 2 mm nodular opacity in the lateral segment right lower lobe, unchanged from recent CT abdomen study. There is atelectatic change in both lung bases. No pleural effusion or pleural thickening evident. Upper Abdomen: There is mild ascites adjacent to the liver. Gallbladder is absent. Patient is status post left nephrectomy. Visualized upper abdominal structures otherwise appear unremarkable. Musculoskeletal: There are no blastic or lytic bone lesions. There are foci of degenerative change in the thoracic spine. IMPRESSION: 1. Several 2-4 mm nodular opacities throughout the lungs. No larger parenchymal lung lesions are evident. The small nodular lesion in the right base seen on recent abdominal CT is stable. No follow-up needed if patient is low-risk (and has no known or suspected primary neoplasm). Non-contrast chest CT can be considered in 12 months if patient is high-risk. This recommendation follows the consensus statement: Guidelines for Management of Incidental Pulmonary Nodules Detected on CT Images: From the Fleischner Society 2017; Radiology 2017; 284:228-243. 2.  No lung edema or consolidation. 3. Multiple subcentimeter mediastinal and axillary lymph nodes. By size criteria no adenopathy is evident in the abdomen or pelvis. 4. Aortic and great vessel atherosclerosis. Foci of coronary artery calcification noted. 5.  Gallbladder and left kidney absent. 6.  Mild ascites of uncertain etiology adjacent to the liver. Aortic Atherosclerosis (ICD10-I70.0). Electronically Signed   By: William  Woodruff III M.D.   On: 10/16/2017 08:41    Medications: I have reviewed the patient's current medications.   Assessment/Plan: 1. 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 Significant improvement in malignant right pleural effusion.  Status post robotic-assisted laparoscopic hysterectomy with bilateral salpingoophorectomy, omentectomy, radical tumor debulking 12/29/2014. Per Dr. Rossi's office note 01/14/2015 cytoreduction was optimal with residual disease remaining only in the 1 mm implants on the small intestine. 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  MSI-stable, mutation burden-4, BRAF rearrangement,ERBB4, KRAS G12D Cycle 1 adjuvant Taxol/carboplatin 01/20/2015 Cycle 2 adjuvant Taxol/carboplatin 02/10/2015 Cycle 3 adjuvant Taxol/carboplatin 03/10/2015 CA 125 on 1013 2016-42  CTs of the chest, abdomen, and pelvis 05/31/2015 with no evidence of progressive ovarian cancer    CTs of the chest, abdomen, and pelvis 08/07/2015 and 08/08/2015-no evidence of progressive ovarian cancer  Peritoneal studding noted at the time of the cholecystectomy procedure  08/09/2015 Elevated CA 125 10/07/2015  CT 10/07/2015 with stricturing at the sigmoid colon, constipation, and omental nodularity  Initiation of salvage weekly Taxol chemotherapy 10/13/2015  Taxol changed to every 2 weeks beginning 01/19/2016 due to painful neuropathy.  CT scans 07/19/2016 with no acute findings. No features in the abdomen or pelvis to suggest recurrent disease.Stable mild fullness right intrarenal collecting system.  Elevated CA 125 treatment resumed with Taxol/Avastin on a 2 week schedule 09/27/2016  CT 02/12/2017-no evidence of carcinomatosis, no evidence of progressive metastatic disease  Taxol/Avastin continued every 2 weeks  CT 09/10/2017-increase in trace ascites, no evidence of progressive carcinomatosis, inflammatory changes at the lung bases with a new 3 mm right lower lobe nodule  Taxol/Avastin continued every 2 weeks 2. COPD-followed by Dr. Byrum. 3. Dyspnea secondary to COPD and the large right pleural effusion, status post therapeutic thoracentesis procedures 09/30/2014,10/09/2014, and 10/21/2014. Left thoracentesis 10/30/2014 4. Left nephrectomy as a child 5. Delayed nausea following cycle 1 Taxol/carboplatin, Aloxi/Emend added with cycle 2 with improvement. 6. Right lower extremity edema, right calf pain 12/09/2014. Negative venous Doppler 12/09/2014. 7. Diffuse pruritus following cycle 1 adjuvant Taxol/carboplatin 01/20/2015, no rash, resolved with a steroid dose pack 8. Thrombocytopenia second to chemotherapy, the carboplatin was dose reduced with cycle 2 adjuvant Taxol/carboplatin 02/10/2015 9. Chemotherapy-induced peripheral neuropathy-painful peripheral neuropathy involving the feet 01/19/2016.  10. Admission with acute cholecystitis 08/07/2015, status post a cholecystectomy 08/09/2015 11. Admission 10/08/2015 with abdominal pain/constipation, improved with bowel rest and laxatives 12. Mild right hydronephrosis noted on the CT 10/07/2015. Renal  ultrasound 12/16/2015 with no hydronephrosis noted.No hydronephrosis on CT 07/19/2016.  Disposition: [name] appears unchanged.  The CA 125 has been higher over the past few months while on treatment with Taxol/Avastin.  There is no clinical or CT evidence of disease progression.  I discussed options at length with [name] and her husband.  We discussed a treatment break versus switching to a different systemic therapy.  She understands no therapy will be curative and there is no clear survival benefit from treatment when she is asymptomatic from cancer.  She has a family vacation planned in June.  She does not wish to receive treatment within several weeks of the vacation, but she is not comfortable with observation now.  [name] last received carboplatin in August 2016.  I recommend treatment with single agent carboplatin.  We reviewed the potential toxicities associated with carboplatin including the chance for nausea, alopecia, hematologic toxicity, and allergic reaction.  She agrees to proceed.  The plan is to begin every 3 weeks single agent carboplatin on 10/24/2017.  45 minutes were spent with the patient today.  The majority of the time was used for counseling and coordination of care.  Gary Sherrill, MD  10/17/2017  10:35 AM   

## 2017-10-17 NOTE — Telephone Encounter (Signed)
Scheduled appt per 4/10 los - Gave patient AVS and calender per los.  

## 2017-10-17 NOTE — Progress Notes (Signed)
START ON PATHWAY REGIMEN - Ovarian     A cycle is every 21 days:     Carboplatin   **Always confirm dose/schedule in your pharmacy ordering system**    Patient Characteristics: Recurrent or Progressive Disease, Third Line Therapy, Platinum Sensitive and >  6 Months Since Last Platinum Therapy, BRCA Mutation Absent/Unknown Therapeutic Status: Recurrent or Progressive Disease AJCC T Category: Staged < 8th Ed. AJCC N Category: Staged < 8th Ed. AJCC M Category: Staged < 8th Ed. AJCC 8 Stage Grouping: Staged < 8th Ed. BRCA Mutation Status: Absent Line of Therapy: Third Line  Intent of Therapy: Non-Curative / Palliative Intent, Discussed with Patient 

## 2017-10-18 ENCOUNTER — Encounter: Payer: Self-pay | Admitting: Emergency Medicine

## 2017-10-18 ENCOUNTER — Ambulatory Visit (INDEPENDENT_AMBULATORY_CARE_PROVIDER_SITE_OTHER): Payer: Medicare Other | Admitting: Emergency Medicine

## 2017-10-18 DIAGNOSIS — J441 Chronic obstructive pulmonary disease with (acute) exacerbation: Secondary | ICD-10-CM

## 2017-10-18 DIAGNOSIS — I2510 Atherosclerotic heart disease of native coronary artery without angina pectoris: Secondary | ICD-10-CM | POA: Diagnosis not present

## 2017-10-18 DIAGNOSIS — R918 Other nonspecific abnormal finding of lung field: Secondary | ICD-10-CM

## 2017-10-18 DIAGNOSIS — J9611 Chronic respiratory failure with hypoxia: Secondary | ICD-10-CM | POA: Diagnosis not present

## 2017-10-18 NOTE — Assessment & Plan Note (Signed)
Several 2-12mm nodules of unclear significance. The R basilar atx / infiltrate has cleared. Given her ovarian CA, the nodules will need to be followed in 6-12 months. We can synchronize this with Dr Gearldine Shown plans for repeat imaging. If there is a change in size then we can discuss any relevant w/u.

## 2017-10-18 NOTE — Assessment & Plan Note (Signed)
Continue same inhaled regimen, will give samples today if available.

## 2017-10-18 NOTE — Assessment & Plan Note (Signed)
Continue o2 as ordered.  

## 2017-10-18 NOTE — Progress Notes (Signed)
Subjective:    Patient ID: Lynn Ramirez, female    DOB: 1941-02-19, 77 y.o.   MRN: [id number]   COPD   Her past medical history is significant for COPD.   77 yo woman, has been followed by Dr Gwenette Greet for COPD, pleural effusions in setting of chemotherapy for serous carcinoma possibly Fallopian. History is taken from the patient and from her husband. She is following with Dr Benay Spice, Dr Denman George. She has been tired, is having some neuropathy. Her breathing is stable. She is on spiriva and symbicort. I personally reviewed her pulmonary function testing from 05/06/2012. This showed severe obstructive lung disease without bronchodilator response.   ROV 07/16/17 --pleasant 77 year old woman with ovarian cancer, associated pleural effusions, significant COPD with associated upper airway irritation and chronic cough.  She has hypoxemic respiratory failure and is on 2 L/min.  She has had a couple of URI's since last time, and her cough has persisted after. She is currently having hoarse voice, is coughing most days, minimally productive. She is on [employer] and Arnu[employer]. She is off nexium, on pepcid. She is on loratadine. She noticed some intermittent L thigh pain - fell recently. She also was on a car ride from [place]. No calf pain.   ROV 10/15/17 --this is a follow-up visit for patient with a history of ovarian cancer with associated pleural effusions, significant COPD with chronic cough and associated upper airway irritation syndrome.  She is on 2 L/min oxygen.  She was seen here 1 month ago and treated with Delsym, Mucinex, Tessalon, doxycycline for flaring of her coughing.  A chest x-ray was done which I reviewed.  This showed mild basilar atelectatic changes without any significant infiltrates.  She returns today reporting that her cough did finally improve. Her CA-125 has been rising on chemo. She had a CT abd that showed some  increased basilar RLL atx / infiltrate. She is on arnuity and stiolto, loratadine, nexium. She is having some low back pain, started today.   ROV 10/18/17 --77 year old woman with history of ovarian cancer, pleural effusions.  We have followed her for COPD and chronic cough, chronic hypoxemia on 2 L/min oxygen.  She is followed by Dr. Benay Spice.  She has had a rising CA-125 and a CT scan of her abdomen showed some right lower lobe atelectasis versus infiltrate.  I asked her to undergo a CT chest That was done on 4/9.  She is here to review the results today. Her chemo regimen is being changed to carboplatinum by Dr Benay Spice    Review of Systems  As per HPI     Objective:   Physical Exam Vitals:   10/18/17 0901  BP: 132/82  Pulse: (!) 101  SpO2: 96%  Weight: 180 lb (81.6 kg)   Gen: Pleasant, well-nourished, in no distress,  normal affect  ENT: No lesions,  mouth clear,  oropharynx clear, no postnasal drip, less hoarseness today  Neck: No JVD, no stridor,   Lungs: No use of  accessory muscles, no wheezing  Cardiovascular: RRR, heart sounds normal, no murmur or gallops, no peripheral edema  Musculoskeletal: No deformities, no cyanosis or clubbing  Neuro: alert, non focal  Skin: Warm, no lesions or rashes      Assessment & Plan:  COPD (chronic obstructive pulmonary disease) (HCC) Continue same inhaled regimen, will give samples today if available.   Chronic respiratory failure with hypoxia (HCC) Continue o2 as ordered  Pulmonary nodules/lesions, multiple Several 2-79mm nodules of unclear significance. The R basilar atx / infiltrate has cleared. Given her ovarian CA, the nodules will need to be followed in 6-12 months. We can synchronize this with Dr Gearldine Shown plans for repeat imaging. If there is a change in size then we can discuss any relevant w/u.   Baltazar Apo, MD, PhD 10/18/2017, 9:22 AM [facility] [phone] or if no answer [phone]

## 2017-10-18 NOTE — Patient Instructions (Addendum)
Please continue your inhaled medications as you have been taking them .  Continue your lasix as directed by Dr Meda Coffee  Follow with Dr Benay Spice as planned We will need to follow your CT chest in 6 -12 months to check the small pulmonary nodules. We can synchronize this with Dr Gearldine Shown plans to repeat your abdominal imaging.  Follow with Dr Lamonte Sakai in 6 months or sooner if you have any problems

## 2017-10-21 ENCOUNTER — Other Ambulatory Visit: Payer: Self-pay | Admitting: Oncology

## 2017-10-24 ENCOUNTER — Encounter: Payer: Self-pay | Admitting: Nurse Practitioner

## 2017-10-24 ENCOUNTER — Inpatient Hospital Stay: Payer: Medicare Other

## 2017-10-24 ENCOUNTER — Telehealth: Payer: Self-pay | Admitting: Nurse Practitioner

## 2017-10-24 ENCOUNTER — Other Ambulatory Visit: Payer: Medicare Other

## 2017-10-24 ENCOUNTER — Ambulatory Visit: Payer: Medicare Other | Admitting: Nurse Practitioner

## 2017-10-24 ENCOUNTER — Ambulatory Visit: Payer: Medicare Other

## 2017-10-24 ENCOUNTER — Inpatient Hospital Stay (HOSPITAL_BASED_OUTPATIENT_CLINIC_OR_DEPARTMENT_OTHER): Payer: Medicare Other | Admitting: Nurse Practitioner

## 2017-10-24 VITALS — BP 135/58 | HR 98 | Temp 98.6°F | Resp 20 | Ht 61.0 in | Wt 185.3 lb

## 2017-10-24 DIAGNOSIS — C562 Malignant neoplasm of left ovary: Secondary | ICD-10-CM

## 2017-10-24 DIAGNOSIS — R188 Other ascites: Secondary | ICD-10-CM | POA: Diagnosis not present

## 2017-10-24 DIAGNOSIS — C5700 Malignant neoplasm of unspecified fallopian tube: Secondary | ICD-10-CM

## 2017-10-24 DIAGNOSIS — C786 Secondary malignant neoplasm of retroperitoneum and peritoneum: Secondary | ICD-10-CM

## 2017-10-24 DIAGNOSIS — C561 Malignant neoplasm of right ovary: Secondary | ICD-10-CM

## 2017-10-24 DIAGNOSIS — C7982 Secondary malignant neoplasm of genital organs: Secondary | ICD-10-CM

## 2017-10-24 DIAGNOSIS — J449 Chronic obstructive pulmonary disease, unspecified: Secondary | ICD-10-CM | POA: Diagnosis not present

## 2017-10-24 DIAGNOSIS — Z905 Acquired absence of kidney: Secondary | ICD-10-CM | POA: Diagnosis not present

## 2017-10-24 DIAGNOSIS — C569 Malignant neoplasm of unspecified ovary: Secondary | ICD-10-CM

## 2017-10-24 DIAGNOSIS — Z95828 Presence of other vascular implants and grafts: Secondary | ICD-10-CM

## 2017-10-24 DIAGNOSIS — Z5111 Encounter for antineoplastic chemotherapy: Secondary | ICD-10-CM | POA: Diagnosis not present

## 2017-10-24 DIAGNOSIS — J910 Malignant pleural effusion: Secondary | ICD-10-CM | POA: Diagnosis not present

## 2017-10-24 DIAGNOSIS — C482 Malignant neoplasm of peritoneum, unspecified: Secondary | ICD-10-CM

## 2017-10-24 LAB — CBC WITH DIFFERENTIAL (CANCER CENTER ONLY)
Basophils Absolute: 0 10*3/uL (ref 0.0–0.1)
Basophils Relative: 1 %
Eosinophils Absolute: 0.1 10*3/uL (ref 0.0–0.5)
Eosinophils Relative: 3 %
HEMATOCRIT: 34.4 % — AB (ref 34.8–46.6)
Hemoglobin: 10.5 g/dL — ABNORMAL LOW (ref 11.6–15.9)
LYMPHS PCT: 21 %
Lymphs Abs: 1 10*3/uL (ref 0.9–3.3)
MCH: 28 pg (ref 25.1–34.0)
MCHC: 30.5 g/dL — AB (ref 31.5–36.0)
MCV: 91.7 fL (ref 79.5–101.0)
MONO ABS: 0.9 10*3/uL (ref 0.1–0.9)
MONOS PCT: 19 %
NEUTROS ABS: 2.6 10*3/uL (ref 1.5–6.5)
NEUTROS PCT: 56 %
Platelet Count: 361 10*3/uL (ref 145–400)
RBC: 3.75 MIL/uL (ref 3.70–5.45)
RDW: 18.8 % — AB (ref 11.2–14.5)
WBC Count: 4.7 10*3/uL (ref 3.9–10.3)

## 2017-10-24 LAB — CMP (CANCER CENTER ONLY)
ALBUMIN: 2.9 g/dL — AB (ref 3.5–5.0)
ALK PHOS: 71 U/L (ref 40–150)
ALT: 9 U/L (ref 0–55)
ANION GAP: 6 (ref 3–11)
AST: 13 U/L (ref 5–34)
BILIRUBIN TOTAL: 0.5 mg/dL (ref 0.2–1.2)
BUN: 13 mg/dL (ref 7–26)
CO2: 31 mmol/L — ABNORMAL HIGH (ref 22–29)
Calcium: 9.6 mg/dL (ref 8.4–10.4)
Chloride: 99 mmol/L (ref 98–109)
Creatinine: 0.82 mg/dL (ref 0.60–1.10)
GFR, Est AFR Am: >60 mL/min (ref >60)
GFR, Estimated: >60 mL/min (ref >60)
GLUCOSE: 112 mg/dL (ref 70–140)
POTASSIUM: 4.1 mmol/L (ref 3.5–5.1)
Sodium: 136 mmol/L (ref 136–145)
TOTAL PROTEIN: 6.7 g/dL (ref 6.4–8.3)

## 2017-10-24 LAB — TOTAL PROTEIN, URINE DIPSTICK: PROTEIN: NEGATIVE mg/dL

## 2017-10-24 MED ORDER — DIPHENHYDRAMINE HCL 50 MG/ML IJ SOLN
INTRAMUSCULAR | Status: AC
  Filled 2017-10-24: qty 1

## 2017-10-24 MED ORDER — FAMOTIDINE IN NACL 20-0.9 MG/50ML-% IV SOLN
INTRAVENOUS | Status: AC
  Filled 2017-10-24: qty 50

## 2017-10-24 MED ORDER — DIPHENHYDRAMINE HCL 50 MG/ML IJ SOLN
25.0000 mg | Freq: Once | INTRAMUSCULAR | Status: AC
  Administered 2017-10-24: 25 mg via INTRAVENOUS

## 2017-10-24 MED ORDER — CARBOPLATIN CHEMO INJECTION 600 MG/60ML
446.5000 mg | Freq: Once | INTRAVENOUS | Status: AC
  Administered 2017-10-24: 450 mg via INTRAVENOUS
  Filled 2017-10-24: qty 45

## 2017-10-24 MED ORDER — HEPARIN SOD (PORK) LOCK FLUSH 100 UNIT/ML IV SOLN
500.0000 [IU] | Freq: Once | INTRAVENOUS | Status: AC | PRN
  Administered 2017-10-24: 500 [IU]
  Filled 2017-10-24: qty 5

## 2017-10-24 MED ORDER — ONDANSETRON 4 MG PO TBDP: 4.0000 mg | ORAL_TABLET | Freq: Three times a day (TID) | ORAL | 1 refills | Status: DC | PRN

## 2017-10-24 MED ORDER — PALONOSETRON HCL INJECTION 0.25 MG/5ML
INTRAVENOUS | Status: AC
  Filled 2017-10-24: qty 5

## 2017-10-24 MED ORDER — SODIUM CHLORIDE 0.9 % IJ SOLN
10.0000 mL | INTRAMUSCULAR | Status: DC | PRN
  Administered 2017-10-24: 10 mL via INTRAVENOUS
  Filled 2017-10-24: qty 10

## 2017-10-24 MED ORDER — FAMOTIDINE IN NACL 20-0.9 MG/50ML-% IV SOLN
20.0000 mg | Freq: Once | INTRAVENOUS | Status: AC
  Administered 2017-10-24: 20 mg via INTRAVENOUS

## 2017-10-24 MED ORDER — SODIUM CHLORIDE 0.9% FLUSH
10.0000 mL | INTRAVENOUS | Status: DC | PRN
  Administered 2017-10-24: 10 mL
  Filled 2017-10-24: qty 10

## 2017-10-24 MED ORDER — SODIUM CHLORIDE 0.9 % IV SOLN
Freq: Once | INTRAVENOUS | Status: AC
  Administered 2017-10-24: 11:00:00 via INTRAVENOUS
  Filled 2017-10-24: qty 5

## 2017-10-24 MED ORDER — SODIUM CHLORIDE 0.9 % IV SOLN
Freq: Once | INTRAVENOUS | Status: AC
  Administered 2017-10-24: 10:00:00 via INTRAVENOUS

## 2017-10-24 MED ORDER — PALONOSETRON HCL INJECTION 0.25 MG/5ML
0.2500 mg | Freq: Once | INTRAVENOUS | Status: AC
  Administered 2017-10-24: 0.25 mg via INTRAVENOUS

## 2017-10-24 NOTE — Telephone Encounter (Signed)
Scheduled appt per 4/17 los - Gave patient AVS and calender per los.  

## 2017-10-24 NOTE — Patient Instructions (Signed)
[facility] Discharge Instructions for Patients Receiving Chemotherapy  Today you received the following chemotherapy agents Carboplatin To help prevent nausea and vomiting after your treatment, we encourage you to take your nausea medication as directed   If you develop nausea and vomiting that is not controlled by your nausea medication, call the clinic.   BELOW ARE SYMPTOMS THAT SHOULD BE REPORTED IMMEDIATELY:  *FEVER GREATER THAN 100.5 F  *CHILLS WITH OR WITHOUT FEVER  NAUSEA AND VOMITING THAT IS NOT CONTROLLED WITH YOUR NAUSEA MEDICATION  *UNUSUAL SHORTNESS OF BREATH  *UNUSUAL BRUISING OR BLEEDING  TENDERNESS IN MOUTH AND THROAT WITH OR WITHOUT PRESENCE OF ULCERS  *URINARY PROBLEMS  *BOWEL PROBLEMS  UNUSUAL RASH Items with * indicate a potential emergency and should be followed up as soon as possible.  Feel free to call the clinic should you have any questions or concerns. The clinic phone number is [phone].  Please show the CHEMO ALERT CARD at check-in to the Emergency Department and triage nurse.      Carboplatin injection What is this medicine? CARBOPLATIN (KAR boe pla tin) is a chemotherapy drug. It targets fast dividing cells, like cancer cells, and causes these cells to die. This medicine is used to treat ovarian cancer and many other cancers. This medicine may be used for other purposes; ask your health care provider or pharmacist if you have questions. COMMON BRAND NAME(S): Paraplatin What should I tell my health care provider before I take this medicine? They need to know if you have any of these conditions: -blood disorders -hearing problems -kidney disease -recent or ongoing radiation therapy -an unusual or allergic reaction to carboplatin, cisplatin, other chemotherapy, other medicines, foods, dyes, or preservatives -pregnant or trying to get pregnant -breast-feeding How should I use this medicine? This drug is usually given as  an infusion into a vein. It is administered in a hospital or clinic by a specially trained health care professional. Talk to your pediatrician regarding the use of this medicine in children. Special care may be needed. Overdosage: If you think you have taken too much of this medicine contact a poison control center or emergency room at once. NOTE: This medicine is only for you. Do not share this medicine with others. What if I miss a dose? It is important not to miss a dose. Call your doctor or health care professional if you are unable to keep an appointment. What may interact with this medicine? -medicines for seizures -medicines to increase blood counts like filgrastim, pegfilgrastim, sargramostim -some antibiotics like amikacin, gentamicin, neomycin, streptomycin, tobramycin -vaccines Talk to your doctor or health care professional before taking any of these medicines: -acetaminophen -aspirin -ibuprofen -ketoprofen -naproxen This list may not describe all possible interactions. Give your health care provider a list of all the medicines, herbs, non-prescription drugs, or dietary supplements you use. Also tell them if you smoke, drink alcohol, or use illegal drugs. Some items may interact with your medicine. What should I watch for while using this medicine? Your condition will be monitored carefully while you are receiving this medicine. You will need important blood work done while you are taking this medicine. This drug may make you feel generally unwell. This is not uncommon, as chemotherapy can affect healthy cells as well as cancer cells. Report any side effects. Continue your course of treatment even though you feel ill unless your doctor tells you to stop. In some cases, you may be given additional medicines to help with side   effects. Follow all directions for their use. Call your doctor or health care professional for advice if you get a fever, chills or sore throat, or other  symptoms of a cold or flu. Do not treat yourself. This drug decreases your body's ability to fight infections. Try to avoid being around people who are sick. This medicine may increase your risk to bruise or bleed. Call your doctor or health care professional if you notice any unusual bleeding. Be careful brushing and flossing your teeth or using a toothpick because you may get an infection or bleed more easily. If you have any dental work done, tell your dentist you are receiving this medicine. Avoid taking products that contain aspirin, acetaminophen, ibuprofen, naproxen, or ketoprofen unless instructed by your doctor. These medicines may hide a fever. Do not become pregnant while taking this medicine. Women should inform their doctor if they wish to become pregnant or think they might be pregnant. There is a potential for serious side effects to an unborn child. Talk to your health care professional or pharmacist for more information. Do not breast-feed an infant while taking this medicine. What side effects may I notice from receiving this medicine? Side effects that you should report to your doctor or health care professional as soon as possible: -allergic reactions like skin rash, itching or hives, swelling of the face, lips, or tongue -signs of infection - fever or chills, cough, sore throat, pain or difficulty passing urine -signs of decreased platelets or bleeding - bruising, pinpoint red spots on the skin, black, tarry stools, nosebleeds -signs of decreased red blood cells - unusually weak or tired, fainting spells, lightheadedness -breathing problems -changes in hearing -changes in vision -chest pain -high blood pressure -low blood counts - This drug may decrease the number of white blood cells, red blood cells and platelets. You may be at increased risk for infections and bleeding. -nausea and vomiting -pain, swelling, redness or irritation at the injection site -pain, tingling,  numbness in the hands or feet -problems with balance, talking, walking -trouble passing urine or change in the amount of urine Side effects that usually do not require medical attention (report to your doctor or health care professional if they continue or are bothersome): -hair loss -loss of appetite -metallic taste in the mouth or changes in taste This list may not describe all possible side effects. Call your doctor for medical advice about side effects. You may report side effects to FDA at 1-800-FDA-1088. Where should I keep my medicine? This drug is given in a hospital or clinic and will not be stored at home. NOTE: This sheet is a summary. It may not cover all possible information. If you have questions about this medicine, talk to your doctor, pharmacist, or health care provider.  2018 Elsevier/Gold Standard (2007-10-01 14:38:05)     

## 2017-10-24 NOTE — Progress Notes (Addendum)
[facility] OFFICE PROGRESS NOTE   Diagnosis: Fallopian tube carcinoma  INTERVAL HISTORY:   Lynn Ramirez returns as scheduled.  She reports progressive fatigue.  She would "feel pretty good" if not for the fatigue.  She denies nausea.  No mouth sores.  Bowels are moving.  She has had recent bilateral lower extremity edema.  She began Lasix 20 mg daily 10/16/2017 and has noted mild improvement.  Objective:  Vital signs in last 24 hours:  Blood pressure (!) 135/58, pulse 98, temperature 98.6 F (37 C), temperature source Oral, resp. rate 20, height '5\' 1"'$  (1.549 m), weight 185 lb 4.8 oz (84.1 kg), SpO2 98 %.    HEENT: No thrush or ulcers. Resp: Distant breath sounds.  No respiratory distress. Cardio: Regular rate and rhythm. GI: Abdomen is soft.  Mild tenderness at the right and left mid abdomen.  No hepatomegaly.  No mass.  No apparent ascites. Vascular: Trace edema at the lower legs/ankles bilaterally.  Port-A-Cath without erythema.  Lab Results:  Lab Results  Component Value Date   WBC 4.7 10/24/2017   HGB 11.2 (L) 08/29/2017   HCT 34.4 (L) 10/24/2017   MCV 91.7 10/24/2017   PLT 361 10/24/2017   NEUTROABS 2.6 10/24/2017    Imaging:  No results found.  Medications: I have reviewed the patient's current medications.  Assessment/Plan: 1. 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, immunohistochemical profile consistent with a GYN primary, elevated CA 125   Staging CTs of the chest, abdomen, and pelvis on 10/06/2014 revealed a loculated right pleural effusion, ascites, and omental/mesenteric thickening   Cytology from peritoneal fluid 10/13/2014 revealed malignant cells consistent with metastatic adenocarcinoma Biopsy of an omental mass on 11/02/2014 revealed invasive serous  carcinoma with psammoma bodies Cycle 1 Taxol/carboplatin 10/28/2014 Cycle 2 Taxol/carboplatin 11/18/2014 Cycle 3 Taxol/carboplatin 12/09/2014  CT scan 12/23/2014 with interval improvement in peritoneal carcinomatosis with near-complete resolution of ascites and decreased omental nodularity. Significant improvement in malignant right pleural effusion.  Status post robotic-assisted laparoscopic hysterectomy with bilateral salpingoophorectomy, omentectomy, radical tumor debulking 12/29/2014. Per Dr. Serita Grit office note 01/14/2015 cytoreduction was optimal with residual disease remaining only in the 1 mm implants on the small intestine. 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  MSI-stable, mutation burden-4, BRAF rearrangement,ERBB4, KRAS G12D Cycle 1 adjuvant Taxol/carboplatin 01/20/2015 Cycle 2 adjuvant Taxol/carboplatin 02/10/2015 Cycle 3 adjuvant Taxol/carboplatin 03/10/2015 CA 125 on 1013 2016-42  CTs of the chest, abdomen, and pelvis 05/31/2015 with no evidence of progressive ovarian cancer  CTs of the chest, abdomen, and pelvis 08/07/2015 and 08/08/2015-no evidence of progressive ovarian cancer  Peritoneal studding noted at the time of the cholecystectomy procedure 08/09/2015 Elevated CA 125 10/07/2015  CT 10/07/2015 with stricturing at the sigmoid colon, constipation, and omental nodularity  Initiation of salvage weekly Taxol chemotherapy 10/13/2015  Taxol changed to every 2 weeks beginning 01/19/2016 due to painful neuropathy.  CT scans 07/19/2016 with no acute findings. No features in the abdomen or pelvis to suggest recurrent disease.Stable mild fullness right intrarenal collecting  system.  Elevated CA 125 treatment resumed with Taxol/Avastin on a 2 week schedule 09/27/2016  CT 02/12/2017-no evidence of carcinomatosis, no evidence of progressive metastatic disease  Taxol/Avastin continued every 2 weeks  CT 09/10/2017-increase in trace ascites, no evidence of  progressive carcinomatosis, inflammatory changes at the lung bases with a new 3 mm right lower lobe nodule  Taxol/Avastin continued every 2 weeks  Progressive rise in the CA 125  Cycle 1 carboplatin 3-week schedule IV 17 2019 2. COPD-followed by Dr. Lamonte Sakai. 3. Dyspnea secondary to COPD and the large right pleural effusion, status post therapeutic thoracentesis procedures 09/30/2014,10/09/2014, and 10/21/2014. Left thoracentesis 10/30/2014 4. Left nephrectomy as a child 5. Delayed nausea following cycle 1 Taxol/carboplatin, Aloxi/Emend added with cycle 2 with improvement. 6. Right lower extremity edema, right calf pain 12/09/2014. Negative venous Doppler 12/09/2014. 7. Diffuse pruritus following cycle 1 adjuvant Taxol/carboplatin 01/20/2015, no rash, resolved with a steroid dose pack 8. Thrombocytopenia second to chemotherapy, the carboplatin was dose reduced with cycle 2 adjuvant Taxol/carboplatin 02/10/2015 9. Chemotherapy-induced peripheral neuropathy-painful peripheral neuropathy involving the feet 01/19/2016.  10. Admission with acute cholecystitis 08/07/2015, status post a cholecystectomy 08/09/2015 11. Admission 10/08/2015 with abdominal pain/constipation, improved with bowel rest and laxatives 12. Mild right hydronephrosis noted on the CT 10/07/2015. Renal ultrasound 12/16/2015 with no hydronephrosis noted.No hydronephrosis on CT 07/19/2016.     Disposition: Lynn Ramirez appears unchanged.  Plan to proceed with cycle 1 carboplatin today as scheduled.  We again reviewed potential toxicities.  Questions were answered.  She agrees to proceed.  She will return for a follow-up visit and cycle 2 carboplatin  on 11/14/2017.  She will contact the office in the interim with any problems.  Patient seen with Dr. Benay Spice.    Ned Card ANP/GNP-BC   10/24/2017  9:19 AM This was a shared visit with Ned Card.  We discussed the options with Lynn Ramirez and her husband.  We discussed the potential for an allergic reaction with carboplatin.  She agrees to proceed.  We checked a baseline CA 125 today.  Julieanne Manson, MD

## 2017-10-25 LAB — CA 125: Cancer Antigen (CA) 125: 914.4 U/mL — ABNORMAL HIGH (ref 0.0–38.1)

## 2017-10-31 ENCOUNTER — Other Ambulatory Visit: Payer: Self-pay

## 2017-10-31 ENCOUNTER — Ambulatory Visit: Payer: Self-pay | Admitting: *Deleted

## 2017-10-31 ENCOUNTER — Emergency Department (HOSPITAL_COMMUNITY)
Admission: EM | Admit: 2017-10-31 | Discharge: 2017-10-31 | Disposition: A | Discharge status: Home / Self Care | Payer: Medicare Other | Attending: Emergency Medicine | Admitting: Emergency Medicine

## 2017-10-31 ENCOUNTER — Emergency Department (HOSPITAL_COMMUNITY): Payer: Medicare Other

## 2017-10-31 ENCOUNTER — Encounter (HOSPITAL_COMMUNITY): Payer: Self-pay | Admitting: Emergency Medicine

## 2017-10-31 DIAGNOSIS — Z87891 Personal history of nicotine dependence: Secondary | ICD-10-CM | POA: Diagnosis not present

## 2017-10-31 DIAGNOSIS — W19XXXA Unspecified fall, initial encounter: Secondary | ICD-10-CM

## 2017-10-31 DIAGNOSIS — Y939 Activity, unspecified: Secondary | ICD-10-CM | POA: Insufficient documentation

## 2017-10-31 DIAGNOSIS — N182 Chronic kidney disease, stage 2 (mild): Secondary | ICD-10-CM | POA: Diagnosis not present

## 2017-10-31 DIAGNOSIS — S0990XA Unspecified injury of head, initial encounter: Secondary | ICD-10-CM | POA: Diagnosis not present

## 2017-10-31 DIAGNOSIS — Z85118 Personal history of other malignant neoplasm of bronchus and lung: Secondary | ICD-10-CM | POA: Insufficient documentation

## 2017-10-31 DIAGNOSIS — Y92009 Unspecified place in unspecified non-institutional (private) residence as the place of occurrence of the external cause: Secondary | ICD-10-CM | POA: Insufficient documentation

## 2017-10-31 DIAGNOSIS — M542 Cervicalgia: Secondary | ICD-10-CM | POA: Diagnosis not present

## 2017-10-31 DIAGNOSIS — S199XXA Unspecified injury of neck, initial encounter: Secondary | ICD-10-CM | POA: Diagnosis not present

## 2017-10-31 DIAGNOSIS — I129 Hypertensive chronic kidney disease with stage 1 through stage 4 chronic kidney disease, or unspecified chronic kidney disease: Secondary | ICD-10-CM | POA: Insufficient documentation

## 2017-10-31 DIAGNOSIS — Y999 Unspecified external cause status: Secondary | ICD-10-CM | POA: Insufficient documentation

## 2017-10-31 DIAGNOSIS — R51 Headache: Secondary | ICD-10-CM | POA: Diagnosis not present

## 2017-10-31 DIAGNOSIS — Z043 Encounter for examination and observation following other accident: Secondary | ICD-10-CM | POA: Diagnosis present

## 2017-10-31 DIAGNOSIS — W1830XA Fall on same level, unspecified, initial encounter: Secondary | ICD-10-CM | POA: Diagnosis not present

## 2017-10-31 DIAGNOSIS — S0083XA Contusion of other part of head, initial encounter: Secondary | ICD-10-CM | POA: Insufficient documentation

## 2017-10-31 DIAGNOSIS — J449 Chronic obstructive pulmonary disease, unspecified: Secondary | ICD-10-CM | POA: Insufficient documentation

## 2017-10-31 DIAGNOSIS — Z79899 Other long term (current) drug therapy: Secondary | ICD-10-CM | POA: Insufficient documentation

## 2017-10-31 MED ORDER — DIAZEPAM 2 MG PO TABS: 2.0000 mg | ORAL_TABLET | Freq: Four times a day (QID) | ORAL | 0 refills | Status: DC | PRN

## 2017-10-31 MED ORDER — DICLOFENAC SODIUM 1 % TD GEL: 4.0000 g | Freq: Four times a day (QID) | TRANSDERMAL | 0 refills | Status: DC

## 2017-10-31 MED ORDER — ACETAMINOPHEN 500 MG PO TABS
1000.0000 mg | ORAL_TABLET | Freq: Once | ORAL | Status: AC
  Administered 2017-10-31: 1000 mg via ORAL
  Filled 2017-10-31: qty 2

## 2017-10-31 NOTE — ED Notes (Signed)
Bed: [facility] Expected date:  Expected time:  Means of arrival:  Comments: 

## 2017-10-31 NOTE — Telephone Encounter (Signed)
Pt fell last pm - hitting head on a carpeted floor. She has an in indentation over r eyebrow. She  Has neck pain  With decreased ROM with stiffness present.Dr Birdie Riddle not in office this  Week. Patient advised to  Go to Er to be evaluated per protocol.Pt will do so  She states she will go to [facility]     Reason for Disposition . Sounds like a serious injury to the triager  Answer Assessment - Initial Assessment Questions 1. MECHANISM: "How did the injury happen?" For falls, ask: "What height did you fall from?" and "What surface did you fall against?"       Was walking with cane and fell on carpet  2. ONSET: "When did the injury happen?" (Minutes or hours ago)        15 hours   Ago   3. NEUROLOGIC SYMPTOMS: "Was there any loss of consciousness?" "Are there any other neurological symptoms?"      No loss of consciousness No neurological symptoms     4. MENTAL STATUS: "Does the person know who he is, who you are, and where he is?"        Alert  And  Awake    5. LOCATION: "What part of the head was hit?"       Forehead   6. SCALP APPEARANCE: "What does the scalp look like? Is it bleeding now?" If so, ask: "Is it difficult to stop?"         Abrasion   7. SIZE: For cuts, bruises, or swelling, ask: "How large is it?" (e.g., inches or centimeters)          Indention above r  Eyebrow  Small amount near eyebrow  The area is  About the size of a dime   8. PAIN: "Is there any pain?" If so, ask: "How bad is it?"  (e.g., Scale 1-10; or mild, moderate, severe)     Pt has neck pain  -  Pt has pain scale  Of  8 pain is worse on movement and pt  Has  Limited  ROM  Pt has no loss of  Strength of  Extremities   9. TETANUS: For any breaks in the skin, ask: "When was the last tetanus booster?"     n/a 10. OTHER SYMPTOMS: "Do you have any other symptoms?" (e.g., neck pain, vomiting)       Neck pain  Radiates  Up  To  Back of head  With a lot of pressure  In back of head   Pt takes  Chemo Carbo medication Carba  1    Week  Ago  No bruises  11. PREGNANCY: "Is there any chance you are pregnant?" "When was your last menstrual period?"       N/a  Protocols used: HEAD INJURY-A-AH

## 2017-10-31 NOTE — Telephone Encounter (Signed)
Patient has arrived in ER.

## 2017-10-31 NOTE — ED Notes (Signed)
C collar applied and Floyd MD at bedside assessing pt.

## 2017-10-31 NOTE — ED Provider Notes (Signed)
[facility] DEPT Provider Note   CSN: [id number] Arrival date & time: 10/31/17  1154     History   Chief Complaint Chief Complaint  Patient presents with  . Fall    HPI Lynn Ramirez is a 77 y.o. female.  77 yo F with a chief complaint of a fall from standing.  She believes she lost her footing.  Struck her head.  This happened about 16 hours ago.  No significant pain afterwards but worsening this morning.  She called her doctor who is out of town and so they suggested she come here for evaluation.  She is little bit woozy after the initial fall.  She denies chest pain back pain abdominal pain extremity pain.  Denies vomiting.  The history is provided by the patient.  Fall  This is a new problem. The current episode started yesterday. The problem occurs constantly. The problem has not changed since onset.Associated symptoms include headaches. Pertinent negatives include no chest pain and no shortness of breath. Nothing aggravates the symptoms. Nothing relieves the symptoms.    Past Medical History:  Diagnosis Date  . CKD (chronic kidney disease), stage II   . COPD (chronic obstructive pulmonary disease) ([place])   . Coronary artery calcification seen on CT scan   . Difficulty sleeping   . Eczema    hands  . Emphysema   . GERD (gastroesophageal reflux disease)   . H/O hydronephrosis   . History of transfusion    age 89  . Hyperlipidemia   . Hypertension   . Ovarian cancer ([place]) dx'd 09/2014   metastatic - prior malignant R pleural effusion and peritoneal carcinomatosis  . S/p nephrectomy     Patient Active Problem List   Diagnosis Date Noted  . Left leg pain 07/16/2017  . S/p nephrectomy   . History of transfusion   . H/O hydronephrosis   . GERD (gastroesophageal reflux disease)   . Eczema   . Difficulty sleeping   . Coronary artery calcification seen on CT scan   . CKD (chronic kidney disease), stage II   . Chronic diastolic CHF  (congestive heart failure) ([place]) 04/26/2017  . Non-ischemic cardiomyopathy ([place]) 04/26/2017  . Mixed incontinence urge and stress 01/31/2017  . Chronic cough 01/15/2017  . Patient on antineoplastic chemotherapy regimen 10/18/2016  . Pain and swelling of right lower extremity 10/02/2016  . Goals of care, counseling/discussion 09/26/2016  . HTN (hypertension) 08/07/2016  . B12 deficiency 08/07/2016  . Vitamin D deficiency 08/07/2016  . Diarrhea 07/06/2016  . Cramping of hands 07/06/2016  . Port catheter in place 11/02/2015  . Allergic rhinitis 11/02/2015  . Constipation   . Sigmoid stricture ([place])   . Obstipation 10/08/2015  . Abnormal CXR   . COPD (chronic obstructive pulmonary disease) ([place])   . Pulmonary nodules/lesions, multiple 08/08/2015  . Chronic respiratory failure with hypoxia ([place]) 06/20/2015  . CAD (coronary artery disease) 03/31/2015  . Hyperlipidemia 03/31/2015  . Claudication ([place]) 03/31/2015  . Chemotherapy-induced neuropathy ([place]) 03/25/2015  . Genetic testing 02/19/2015  . Malignant neoplasm of fallopian tube ([place]) 02/19/2015  . Family history of pancreatic cancer 02/19/2015  . Family history of breast cancer in female 02/19/2015  . Ovarian cancer ([place]) 12/29/2014  . Primary peritoneal carcinomatosis ([place]) 10/23/2014  . Metastatic adenocarcinoma ([place]) 10/21/2014  . Insomnia 10/11/2014  . Pelvic mass in female 10/05/2014  . Encopresis 06/18/2014  . COPD  GOLD C 03/07/2012    Past Surgical History:  Procedure Laterality  Date  . ABDOMINAL HYSTERECTOMY    . CHOLECYSTECTOMY N/A 08/09/2015   Procedure: Attempted LAPAROSCOPIC coverted  open CHOLECYSTECTOMY WITH INTRAOPERATIVE CHOLANGIOGRAM;  Surgeon: Autumn Messing III, MD;  Location: [facility] ORS;  Service: General;  Laterality: N/A;  . HAND SURGERY Right 1980's  . LAPAROTOMY N/A 12/29/2014   Procedure:  LAPAROTOMY;  Surgeon: Everitt Amber, MD;  Location: [facility];  Service: Gynecology;  Laterality: N/A;  . NEPHRECTOMY    .  ROBOTIC ASSISTED TOTAL HYSTERECTOMY WITH BILATERAL SALPINGO OOPHERECTOMY Bilateral 12/29/2014   Procedure: ROBOTIC ASSISTED TOTAL LAPAROSCOPIC HYSTERECTOMY WITH BILATERAL SALPINGO OOPHORECTOMY AND OOMENTECTOMY WITH RADICAL TUMOR [place] ;  Surgeon: Everitt Amber, MD;  Location: [facility];  Service: Gynecology;  Laterality: Bilateral;  . THORACENTESIS     several  . TONSILLECTOMY    . TUBAL LIGATION       OB History   None      Home Medications    Prior to Admission medications   Medication Sig Start Date End Date Taking? Authorizing Provider  Acetaminophen (TYLENOL) 325 MG CAPS Take 1 tablet by mouth 3 (three) times daily as needed (pain).     [provider]  albuterol (PROVENTIL) (2.5 MG/3ML) 0.083% nebulizer solution Take 3 mLs (2.5 mg total) by nebulization every 6 (six) hours as needed for wheezing or shortness of breath. 12/25/16   Collene Gobble, MD  antiseptic oral rinse (BIOTENE) LIQD 15 mLs by Mouth Rinse route 3 (three) times daily.    [provider]  ARNUITY ELLIPTA 200 MCG/ACT AEPB INHALE 1 PUFF INTO THE LUNGS DAILY 09/11/17   Collene Gobble, MD  atorvastatin (LIPITOR) 10 MG tablet TAKE 1 TABLET(10 MG) BY MOUTH DAILY 08/24/17   Dorothy Spark, MD  benzonatate (TESSALON) 200 MG capsule Take 1 capsule (200 mg total) by mouth 3 (three) times daily as needed for cough. 09/14/17 09/14/18  Parrett, Fonnie Mu, NP  Biotin 2500 MCG CAPS Take 1 tablet by mouth daily.    [provider]  carvedilol (COREG) 3.125 MG tablet TAKE 1 TABLET(3.125 MG) BY MOUTH TWICE DAILY WITH A MEAL 03/20/17   Dorothy Spark, MD  cholecalciferol (VITAMIN D) 1000 units tablet Take 1,000 Units by mouth daily.    [provider]  diazepam (VALIUM) 2 MG tablet Take 1 tablet (2 mg total) by mouth every 6 (six) hours as needed for anxiety (spasms). 10/31/17   Deno Etienne, DO  diclofenac sodium (VOLTAREN) 1 % GEL Apply 4 g topically 4 (four) times daily. 10/31/17   Deno Etienne, DO    diphenoxylate-atropine (LOMOTIL) 2.5-0.025 MG tablet TAKE 2 TABLETS BY MOUTH FOUR TIMES DAILY AS NEEDED FOR DIARRHEA/ LOOSE STOOLS 08/03/17   Ladell Pier, MD  esomeprazole (NEXIUM) 40 MG capsule Take 1 capsule (40 mg total) by mouth daily. 07/16/17   Collene Gobble, MD  fluocinonide cream (LIDEX) 3.55 % Apply 1 application topically 2 (two) times daily as needed (For ezcema).     [provider]  furosemide (LASIX) 20 MG tablet Take 1 tablet (20 mg total) by mouth daily as needed for fluid or edema. 10/16/17 01/14/18  Dorothy Spark, MD  gabapentin (NEURONTIN) 300 MG capsule TAKE 1 CAPSULE BY MOUTH EVERY NIGHT AT BEDTIME 09/07/17   Ladell Pier, MD  lidocaine-prilocaine (EMLA) cream Apply to port site one hour prior to use. Do not rub in. Cover with plastic. 10/12/16   Ladell Pier, MD  loratadine (CLARITIN) 10 MG tablet Take 10 mg by  mouth daily.    [provider]  ondansetron (ZOFRAN-ODT) 4 MG disintegrating tablet Take 1 tablet (4 mg total) by mouth every 8 (eight) hours as needed for nausea or vomiting. 10/24/17   [name], NP  oxyCODONE-acetaminophen (PERCOCET/ROXICET) 5-325 MG tablet Take 1 tablet by mouth every 6 (six) hours as needed for severe pain. 08/15/17   Ladell Pier, MD  PROAIR HFA 108 [phone] Base) MCG/ACT inhaler INHALE 2 PUFFS INTO THE LUNGS FOUR TIMES DAILY AS NEEDED FOR WHEEZING 03/13/17   Collene Gobble, MD  pyridoxine (B-6) 100 MG tablet Take 100 mg by mouth daily.    [provider]  sorbitol 70 % solution Take 30 cc twice daily until bowel movement then once daily 06/14/17   [name], NP  STIOLTO RESPIMAT 2.5-2.5 MCG/ACT AERS INHALE 2 PUFFS INTO THE LUNGS DAILY 09/11/17   Collene Gobble, MD  traMADol (ULTRAM) 50 MG tablet Take 1 tablet (50 mg total) by mouth every 12 (twelve) hours as needed. for pain 09/26/17   Ladell Pier, MD  VIRTUSSIN A/C 100-10 MG/5ML syrup TAKE 10 ML BY MOUTH THREE TIMES DAILY AS NEEDED FOR COUGH 09/05/17    Midge Minium, MD  vitamin B-12 (CYANOCOBALAMIN) 100 MCG tablet Take 100 mcg by mouth daily. Reported on 01/19/2016    [provider]  zolpidem (AMBIEN) 5 MG tablet Take 1 tablet (5 mg total) by mouth at bedtime as needed for sleep. 08/01/17   [name], NP    Family History Family History  Problem Relation Age of Onset  . Diabetes Father   . Lung cancer Father 104       metastasis to liver  . Cancer Father        liver  . Pancreatic cancer Mother 40  . Stroke Paternal Grandfather   . Heart disease Maternal Aunt   . Diabetes Paternal Uncle   . Heart Problems Maternal Grandmother   . Heart Problems Maternal Grandfather   . Diabetes Paternal Grandmother   . Stroke Paternal Grandmother   . Heart Problems Paternal Uncle   . Cancer Cousin        unknown type  . Breast cancer Cousin        dx. 53s  . Leukemia Cousin        dx. 16-17  . Colon cancer Neg Hx   . Stomach cancer Neg Hx     Social History Social History   Tobacco Use  . Smoking status: Former Smoker    Packs/day: 1.00    Years: 40.00    Pack years: 40.00    Types: Cigarettes    Last attempt to quit: 07/10/2013    Years since quitting: 4.3  . Smokeless tobacco: Never Used  Substance Use Topics  . Alcohol use: Yes    Alcohol/week: 0.0 oz    Comment: 1-2 a week  . Drug use: No     Allergies   Latex and Penicillins   Review of Systems Review of Systems  Constitutional: Negative for chills and fever.  HENT: Negative for congestion and rhinorrhea.   Eyes: Negative for redness and visual disturbance.  Respiratory: Negative for shortness of breath and wheezing.   Cardiovascular: Negative for chest pain and palpitations.  Gastrointestinal: Negative for nausea and vomiting.  Genitourinary: Negative for dysuria and urgency.  Musculoskeletal: Positive for neck pain. Negative for arthralgias and myalgias.  Skin: Negative for pallor and wound.  Neurological: Positive for headaches. Negative  for dizziness.     Physical Exam Updated Vital Signs BP 119/74 (BP Location: Right Arm)   Pulse 82   Temp 98.4 F (36.9 C) (Oral)   Resp 16   Ht 5\' 1"  (1.549 m)   Wt 83.9 kg (185 lb)   SpO2 100%   BMI 34.96 kg/m   Physical Exam  Constitutional: She is oriented to person, place, and time. She appears well-developed and well-nourished. No distress.  HENT:  Head: Normocephalic.  Very small right frontal hematoma.  No periorbital tenderness.  Extraocular motor intact.  Eyes: Pupils are equal, round, and reactive to light. EOM are normal.  Neck: Normal range of motion. Neck supple.  Cardiovascular: Normal rate and regular rhythm. Exam reveals no gallop and no friction rub.  No murmur heard. Pulmonary/Chest: Effort normal. She has no wheezes. She has no rales.  Abdominal: Soft. She exhibits no distension. There is no tenderness.  Musculoskeletal: She exhibits no edema or tenderness.  Pulse motor and sensation is intact bilateral upper extremities.  Neurological: She is alert and oriented to person, place, and time.  Skin: Skin is warm and dry. She is not diaphoretic.  Psychiatric: She has a normal mood and affect. Her behavior is normal.  Nursing note and vitals reviewed.    ED Treatments / Results  Labs (all labs ordered are listed, but only abnormal results are displayed) Labs Reviewed - No data to display  EKG None  Radiology Ct Head Wo Contrast  Result Date: 10/31/2017 CLINICAL DATA:  Head trauma, minor.  Neck pain.  Initial encounter. EXAM: CT HEAD WITHOUT CONTRAST CT CERVICAL SPINE WITHOUT CONTRAST TECHNIQUE: Multidetector CT imaging of the head and cervical spine was performed following the standard protocol without intravenous contrast. Multiplanar CT image reconstructions of the cervical spine were also generated. COMPARISON:  04/23/2015 FINDINGS: CT HEAD FINDINGS Brain: No evidence of acute infarction, hemorrhage, hydrocephalus, extra-axial collection or mass  lesion/mass effect. The right putamen is asymmetrically indistinct but this is chronic when compared to 2016. Vascular: No hyperdense vessel or unexpected calcification. Skull: Normal. Negative for fracture or focal lesion. Sinuses/Orbits: No evidence of injury CT CERVICAL SPINE FINDINGS Alignment: No traumatic malalignment. Mild C4-5 anterolisthesis that appears degenerative and facet mediated. Skull base and vertebrae: Negative for fracture Soft tissues and spinal canal: No prevertebral fluid or swelling. No visible canal hematoma. Disc levels: Diffuse degenerative disc narrowing with spurring greatest at the inferior cervical spine. Bulky and diffuse facet arthropathy. C3-4 facet ankylosis. Upper chest: No evidence of injury IMPRESSION: No evidence of acute intracranial or cervical spine injury. Electronically Signed   By: Monte Fantasia M.D.   On: 10/31/2017 14:48   Ct Cervical Spine Wo Contrast  Result Date: 10/31/2017 CLINICAL DATA:  Head trauma, minor.  Neck pain.  Initial encounter. EXAM: CT HEAD WITHOUT CONTRAST CT CERVICAL SPINE WITHOUT CONTRAST TECHNIQUE: Multidetector CT imaging of the head and cervical spine was performed following the standard protocol without intravenous contrast. Multiplanar CT image reconstructions of the cervical spine were also generated. COMPARISON:  04/23/2015 FINDINGS: CT HEAD FINDINGS Brain: No evidence of acute infarction, hemorrhage, hydrocephalus, extra-axial collection or mass lesion/mass effect. The right putamen is asymmetrically indistinct but this is chronic when compared to 2016. Vascular: No hyperdense vessel or unexpected calcification. Skull: Normal. Negative for fracture or focal lesion. Sinuses/Orbits: No evidence of injury CT CERVICAL SPINE FINDINGS Alignment: No traumatic malalignment. Mild C4-5 anterolisthesis that appears degenerative and facet mediated. Skull base and vertebrae: Negative for fracture Soft tissues  and spinal canal: No prevertebral fluid  or swelling. No visible canal hematoma. Disc levels: Diffuse degenerative disc narrowing with spurring greatest at the inferior cervical spine. Bulky and diffuse facet arthropathy. C3-4 facet ankylosis. Upper chest: No evidence of injury IMPRESSION: No evidence of acute intracranial or cervical spine injury. Electronically Signed   By: Monte Fantasia M.D.   On: 10/31/2017 14:48    Procedures Procedures (including critical care time)  Medications Ordered in ED Medications  acetaminophen (TYLENOL) tablet 1,000 mg (1,000 mg Oral Given 10/31/17 1330)     Initial Impression / Assessment and Plan / ED Course  I have reviewed the triage vital signs and the nursing notes.  Pertinent labs & imaging results that were available during my care of the patient were reviewed by me and considered in my medical decision making (see chart for details).     77 yO F with a chief complaint of a fall from standing.  Believed to be mechanical.  Struck her head and complaining of head and neck pain.  Will obtain a CT of the head and C-spine.  CT negative.  D/c home.   6:08 PM:  I have discussed the diagnosis/risks/treatment options with the patient and family and believe the pt to be eligible for discharge home to follow-up with PCP. We also discussed returning to the ED immediately if new or worsening sx occur. We discussed the sx which are most concerning (e.g., sudden worsening pain, fever, inability to tolerate by mouth) that necessitate immediate return. Medications administered to the patient during their visit and any new prescriptions provided to the patient are listed below.  Medications given during this visit Medications  acetaminophen (TYLENOL) tablet 1,000 mg (1,000 mg Oral Given 10/31/17 1330)     The patient appears reasonably screen and/or stabilized for discharge and I doubt any other medical condition or other [facility] requiring further screening, evaluation, or treatment in the ED at this time  prior to discharge.    Final Clinical Impressions(s) / ED Diagnoses   Final diagnoses:  Fall in home, initial encounter    ED Discharge Orders        Ordered    diazepam (VALIUM) 2 MG tablet  Every 6 hours PRN     10/31/17 1520    diclofenac sodium (VOLTAREN) 1 % GEL  4 times daily     10/31/17 [place], Louise Victory, DO 10/31/17 1809

## 2017-10-31 NOTE — ED Notes (Signed)
Pt verbalizes fall yesterday resulting in hitting right forehead. Pt denies blood thinner use. Complaint of head pain and neck pain.

## 2017-10-31 NOTE — ED Notes (Signed)
Pt reports that she fell yesterday in her bedroom on the way to the bathroom. Pt stated that she uses her cane and is not sure why she fell, pt denies LOC and stated that she hit the right side of her forehead.Pt is c/o of head neck and back pain. Pt is escorted with spouse.

## 2017-11-07 ENCOUNTER — Ambulatory Visit: Payer: Medicare Other | Admitting: Oncology

## 2017-11-07 ENCOUNTER — Ambulatory Visit: Payer: Medicare Other

## 2017-11-07 ENCOUNTER — Other Ambulatory Visit: Payer: Medicare Other

## 2017-11-14 ENCOUNTER — Inpatient Hospital Stay (HOSPITAL_BASED_OUTPATIENT_CLINIC_OR_DEPARTMENT_OTHER): Payer: Medicare Other | Admitting: Oncology

## 2017-11-14 ENCOUNTER — Telehealth: Payer: Self-pay | Admitting: Oncology

## 2017-11-14 ENCOUNTER — Inpatient Hospital Stay: Payer: Medicare Other | Attending: Nurse Practitioner

## 2017-11-14 ENCOUNTER — Inpatient Hospital Stay: Payer: Medicare Other

## 2017-11-14 VITALS — BP 146/64 | HR 86 | Temp 97.7°F | Resp 17 | Ht 61.0 in | Wt 183.6 lb

## 2017-11-14 DIAGNOSIS — Z90710 Acquired absence of both cervix and uterus: Secondary | ICD-10-CM | POA: Insufficient documentation

## 2017-11-14 DIAGNOSIS — C482 Malignant neoplasm of peritoneum, unspecified: Secondary | ICD-10-CM | POA: Insufficient documentation

## 2017-11-14 DIAGNOSIS — Z90722 Acquired absence of ovaries, bilateral: Secondary | ICD-10-CM | POA: Insufficient documentation

## 2017-11-14 DIAGNOSIS — I2510 Atherosclerotic heart disease of native coronary artery without angina pectoris: Secondary | ICD-10-CM | POA: Diagnosis not present

## 2017-11-14 DIAGNOSIS — K5900 Constipation, unspecified: Secondary | ICD-10-CM | POA: Diagnosis not present

## 2017-11-14 DIAGNOSIS — Z5111 Encounter for antineoplastic chemotherapy: Secondary | ICD-10-CM | POA: Diagnosis not present

## 2017-11-14 DIAGNOSIS — C561 Malignant neoplasm of right ovary: Secondary | ICD-10-CM | POA: Insufficient documentation

## 2017-11-14 DIAGNOSIS — D649 Anemia, unspecified: Secondary | ICD-10-CM | POA: Insufficient documentation

## 2017-11-14 DIAGNOSIS — C786 Secondary malignant neoplasm of retroperitoneum and peritoneum: Secondary | ICD-10-CM | POA: Diagnosis not present

## 2017-11-14 DIAGNOSIS — R918 Other nonspecific abnormal finding of lung field: Secondary | ICD-10-CM

## 2017-11-14 DIAGNOSIS — R3911 Hesitancy of micturition: Secondary | ICD-10-CM | POA: Diagnosis not present

## 2017-11-14 DIAGNOSIS — C569 Malignant neoplasm of unspecified ovary: Secondary | ICD-10-CM

## 2017-11-14 DIAGNOSIS — C5700 Malignant neoplasm of unspecified fallopian tube: Secondary | ICD-10-CM

## 2017-11-14 LAB — CBC WITH DIFFERENTIAL (CANCER CENTER ONLY)
BASOS ABS: 0 10*3/uL (ref 0.0–0.1)
Basophils Relative: 0 %
Eosinophils Absolute: 0 10*3/uL (ref 0.0–0.5)
Eosinophils Relative: 1 %
HEMATOCRIT: 29.3 % — AB (ref 34.8–46.6)
Hemoglobin: 8.6 g/dL — ABNORMAL LOW (ref 11.6–15.9)
LYMPHS ABS: 0.8 10*3/uL — AB (ref 0.9–3.3)
LYMPHS PCT: 22 %
MCH: 27.1 pg (ref 25.1–34.0)
MCHC: 29.4 g/dL — ABNORMAL LOW (ref 31.5–36.0)
MCV: 92.4 fL (ref 79.5–101.0)
MONO ABS: 0.8 10*3/uL (ref 0.1–0.9)
Monocytes Relative: 20 %
NEUTROS ABS: 2.1 10*3/uL (ref 1.5–6.5)
Neutrophils Relative %: 57 %
Platelet Count: 432 10*3/uL — ABNORMAL HIGH (ref 145–400)
RBC: 3.17 MIL/uL — ABNORMAL LOW (ref 3.70–5.45)
RDW: 20.7 % — AB (ref 11.2–14.5)
WBC Count: 3.7 10*3/uL — ABNORMAL LOW (ref 3.9–10.3)

## 2017-11-14 LAB — CMP (CANCER CENTER ONLY)
ALBUMIN: 2.4 g/dL — AB (ref 3.5–5.0)
ALT: 11 U/L (ref 0–55)
ANION GAP: 6 (ref 3–11)
AST: 17 U/L (ref 5–34)
Alkaline Phosphatase: 72 U/L (ref 40–150)
BILIRUBIN TOTAL: 0.3 mg/dL (ref 0.2–1.2)
BUN: 16 mg/dL (ref 7–26)
CHLORIDE: 103 mmol/L (ref 98–109)
CO2: 31 mmol/L — AB (ref 22–29)
Calcium: 9.3 mg/dL (ref 8.4–10.4)
Creatinine: 0.84 mg/dL (ref 0.60–1.10)
GFR, Est AFR Am: >60 mL/min (ref >60)
GFR, Estimated: >60 mL/min (ref >60)
GLUCOSE: 104 mg/dL (ref 70–140)
POTASSIUM: 4 mmol/L (ref 3.5–5.1)
SODIUM: 140 mmol/L (ref 136–145)
TOTAL PROTEIN: 6.7 g/dL (ref 6.4–8.3)

## 2017-11-14 MED ORDER — PALONOSETRON HCL INJECTION 0.25 MG/5ML
INTRAVENOUS | Status: AC
  Filled 2017-11-14: qty 5

## 2017-11-14 MED ORDER — HEPARIN SOD (PORK) LOCK FLUSH 100 UNIT/ML IV SOLN
500.0000 [IU] | Freq: Once | INTRAVENOUS | Status: AC | PRN
  Administered 2017-11-14: 500 [IU]
  Filled 2017-11-14: qty 5

## 2017-11-14 MED ORDER — FAMOTIDINE IN NACL 20-0.9 MG/50ML-% IV SOLN
INTRAVENOUS | Status: AC
  Filled 2017-11-14: qty 50

## 2017-11-14 MED ORDER — PALONOSETRON HCL INJECTION 0.25 MG/5ML
0.2500 mg | Freq: Once | INTRAVENOUS | Status: AC
  Administered 2017-11-14: 0.25 mg via INTRAVENOUS

## 2017-11-14 MED ORDER — DIPHENHYDRAMINE HCL 50 MG/ML IJ SOLN
INTRAMUSCULAR | Status: AC
  Filled 2017-11-14: qty 1

## 2017-11-14 MED ORDER — DIPHENHYDRAMINE HCL 50 MG/ML IJ SOLN
25.0000 mg | Freq: Once | INTRAMUSCULAR | Status: AC
  Administered 2017-11-14: 25 mg via INTRAVENOUS

## 2017-11-14 MED ORDER — CARBOPLATIN CHEMO INJECTION 600 MG/60ML
446.5000 mg | Freq: Once | INTRAVENOUS | Status: AC
  Administered 2017-11-14: 450 mg via INTRAVENOUS
  Filled 2017-11-14: qty 45

## 2017-11-14 MED ORDER — FAMOTIDINE IN NACL 20-0.9 MG/50ML-% IV SOLN
20.0000 mg | Freq: Once | INTRAVENOUS | Status: AC
  Administered 2017-11-14: 20 mg via INTRAVENOUS

## 2017-11-14 MED ORDER — SODIUM CHLORIDE 0.9% FLUSH
10.0000 mL | INTRAVENOUS | Status: DC | PRN
  Administered 2017-11-14: 10 mL
  Filled 2017-11-14: qty 10

## 2017-11-14 MED ORDER — SODIUM CHLORIDE 0.9 % IV SOLN
Freq: Once | INTRAVENOUS | Status: AC
  Administered 2017-11-14: 13:00:00 via INTRAVENOUS
  Filled 2017-11-14: qty 5

## 2017-11-14 MED ORDER — SODIUM CHLORIDE 0.9 % IV SOLN
Freq: Once | INTRAVENOUS | Status: AC
  Administered 2017-11-14: 12:00:00 via INTRAVENOUS

## 2017-11-14 NOTE — Progress Notes (Signed)
[facility] OFFICE PROGRESS NOTE   Diagnosis: Peritoneal carcinoma  INTERVAL HISTORY:   Lynn Ramirez returns as scheduled.  She completed a cycle of single agent carboplatin on 10/24/2017.  She reports no symptom of an acute allergic reaction.  She had pruritus beginning 3 days following chemotherapy.  No rash.  This was relieved with a topical cream.  She had nausea for 1 week following chemotherapy.  No emesis.  She reports increased constipation, relieved with laxatives.  She is also noted increased dyspnea and leg edema.  The leg edema improves at night. She was seen in the emergency room 10/31/2017 after a fall.  CTs of the head and neck revealed no acute abnormality. Objective:  Vital signs in last 24 hours:  Blood pressure (!) 146/64, pulse 86, temperature 97.7 F (36.5 C), temperature source Oral, resp. rate 17, height _0  (1.549 m), weight 183 lb 9.6 oz (83.3 kg), SpO2 97 %.    HEENT: The mouth is dry, no thrush Resp: Distant breath sounds, end inspiratory rhonchi at the left posterior base, no respiratory distress Cardio: Regular rate and rhythm GI: No mass, mild diffuse tenderness,?  Ascites Vascular: No leg edema    Portacath/PICC-without erythema  Lab Results:  Lab Results  Component Value Date   WBC 3.7 (L) 11/14/2017   HGB 8.6 (L) 11/14/2017   HCT 29.3 (L) 11/14/2017   MCV 92.4 11/14/2017   PLT 432 (H) 11/14/2017   NEUTROABS 2.1 11/14/2017    CMP     Component Value Date/Time   NA 140 11/14/2017 1011   NA 137 07/04/2017 1122   K 4.0 11/14/2017 1011   K 4.1 07/04/2017 1122   CL 103 11/14/2017 1011   CO2 31 (H) 11/14/2017 1011   CO2 30 (H) 07/04/2017 1122   GLUCOSE 104 11/14/2017 1011   GLUCOSE 104 07/04/2017 1122   BUN 16 11/14/2017 1011   BUN 15.8 07/04/2017 1122   CREATININE 0.84 11/14/2017 1011   CREATININE 0.9 07/04/2017 1122   CALCIUM 9.3 11/14/2017 1011   CALCIUM 9.4 07/04/2017 1122   PROT 6.7 11/14/2017 1011   PROT 7.0  07/04/2017 1122   ALBUMIN 2.4 (L) 11/14/2017 1011   ALBUMIN 3.4 (L) 07/04/2017 1122   AST 17 11/14/2017 1011   AST 13 07/04/2017 1122   ALT 11 11/14/2017 1011   ALT 10 07/04/2017 1122   ALKPHOS 72 11/14/2017 1011   ALKPHOS 73 07/04/2017 1122   BILITOT 0.3 11/14/2017 1011   BILITOT 0.49 07/04/2017 1122   GFRNONAA >60 11/14/2017 1011   GFRAA >60 11/14/2017 1011     Medications: I have reviewed the patient's current medications.   Assessment/Plan: 1. 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 Significant improvement in malignant right pleural effusion.  Status post robotic-assisted laparoscopic hysterectomy with bilateral salpingoophorectomy, omentectomy, radical tumor debulking 12/29/2014. Per Dr. Serita Grit office note 01/14/2015 cytoreduction was optimal with residual disease remaining only in the 1 mm  implants on the small intestine. Pathology on the omentum showed high-grade serous carcinoma; papillary high-grade serous carcinoma arising from the right fallopian tube; high-grade serous carcinoma involving the right ovary;  high-grade serous carcinoma involving paratubal soft tissue of the left fallopian tube; high-grade serous carcinoma involving left ovary.  MSI-stable, mutation burden-4, BRAF rearrangement,ERBB4, KRAS G12D Cycle 1 adjuvant Taxol/carboplatin 01/20/2015 Cycle 2 adjuvant Taxol/carboplatin 02/10/2015 Cycle 3 adjuvant Taxol/carboplatin 03/10/2015 CA 125 on 1013 2016-42  CTs of the chest, abdomen, and pelvis 05/31/2015 with no evidence of progressive ovarian cancer  CTs of the chest, abdomen, and pelvis 08/07/2015 and 08/08/2015-no evidence of progressive ovarian cancer  Peritoneal studding noted at the time of the cholecystectomy procedure 08/09/2015 Elevated CA 125 10/07/2015  CT 10/07/2015 with stricturing at the sigmoid colon, constipation, and omental nodularity  Initiation of salvage weekly Taxol chemotherapy 10/13/2015  Taxol changed to every 2 weeks beginning 01/19/2016 due to painful neuropathy.  CT scans 07/19/2016 with no acute findings. No features in the abdomen or pelvis to suggest recurrent disease.Stable mild fullness right intrarenal collecting system.  Elevated CA 125 treatment resumed with Taxol/Avastin on a 2 week schedule 09/27/2016  CT 02/12/2017-no evidence of carcinomatosis, no evidence of progressive metastatic disease  Taxol/Avastin continued every 2 weeks  CT 09/10/2017-increase in trace ascites, no evidence of progressive carcinomatosis, inflammatory changes at the lung bases with a new 3 mm right lower lobe nodule  Taxol/Avastin continued every 2 weeks  Progressive rise in the CA 125  Cycle 1 carboplatin 3-week schedule 10/24/2017  Cycle 2 carboplatin 11/14/2017 2. COPD-followed by Dr. Lamonte Sakai. 3. Dyspnea secondary to COPD and the large right pleural effusion, status post therapeutic thoracentesis procedures 09/30/2014,10/09/2014, and 10/21/2014. Left thoracentesis 10/30/2014 4. Left nephrectomy as a child 5. Delayed nausea following cycle 1  Taxol/carboplatin, Aloxi/Emend added with cycle 2 with improvement. 6. Right lower extremity edema, right calf pain 12/09/2014. Negative venous Doppler 12/09/2014. 7. Diffuse pruritus following cycle 1 adjuvant Taxol/carboplatin 01/20/2015, no rash, resolved with a steroid dose pack 8. Thrombocytopenia second to chemotherapy, the carboplatin was dose reduced with cycle 2 adjuvant Taxol/carboplatin 02/10/2015 9. Chemotherapy-induced peripheral neuropathy-painful peripheral neuropathy involving the feet 01/19/2016.  10. Admission with acute cholecystitis 08/07/2015, status post a cholecystectomy 08/09/2015 11. Admission 10/08/2015 with abdominal pain/constipation, improved with bowel rest and laxatives 12. Mild right hydronephrosis noted on the CT 10/07/2015. Renal ultrasound 12/16/2015 with no hydronephrosis noted.No hydronephrosis on CT 07/19/2016.    Disposition: Lynn Ramirez appears stable.  She tolerated the first cycle of carboplatin well.  She has developed increased anemia, likely secondary to chemotherapy.  She will contact us for increased dyspnea.  She will return for a CBC on 11/22/2017.  We will arrange for a red cell transfusion as indicated.  We discussed adding Decadron to prevent delayed nausea.  She does not wish to take Decadron.  She will return for an office visit in the next cycle of chemotherapy and 3 weeks.   Betsy Coder, MD  11/14/2017  11:29 AM

## 2017-11-14 NOTE — Patient Instructions (Signed)
[facility] Discharge Instructions for Patients Receiving Chemotherapy  Today you received the following chemotherapy agents Carboplatin  To help prevent nausea and vomiting after your treatment, we encourage you to take your nausea medication as directed   If you develop nausea and vomiting that is not controlled by your nausea medication, call the clinic.   BELOW ARE SYMPTOMS THAT SHOULD BE REPORTED IMMEDIATELY:  *FEVER GREATER THAN 100.5 F  *CHILLS WITH OR WITHOUT FEVER  NAUSEA AND VOMITING THAT IS NOT CONTROLLED WITH YOUR NAUSEA MEDICATION  *UNUSUAL SHORTNESS OF BREATH  *UNUSUAL BRUISING OR BLEEDING  TENDERNESS IN MOUTH AND THROAT WITH OR WITHOUT PRESENCE OF ULCERS  *URINARY PROBLEMS  *BOWEL PROBLEMS  UNUSUAL RASH Items with * indicate a potential emergency and should be followed up as soon as possible.  Feel free to call the clinic should you have any questions or concerns. The clinic phone number is [phone].  Please show the CHEMO ALERT CARD at check-in to the Emergency Department and triage nurse.   

## 2017-11-14 NOTE — Telephone Encounter (Signed)
Scheduled appt per 5/8 los - gave patient calender.

## 2017-11-15 LAB — CA 125: Cancer Antigen (CA) 125: 659 U/mL — ABNORMAL HIGH (ref 0.0–38.1)

## 2017-11-16 ENCOUNTER — Inpatient Hospital Stay (HOSPITAL_BASED_OUTPATIENT_CLINIC_OR_DEPARTMENT_OTHER): Payer: Medicare Other | Admitting: Gynecologic Oncology

## 2017-11-16 ENCOUNTER — Encounter: Payer: Self-pay | Admitting: Gynecologic Oncology

## 2017-11-16 VITALS — BP 128/59 | HR 66 | Temp 98.3°F | Resp 20 | Ht 61.0 in | Wt 184.0 lb

## 2017-11-16 DIAGNOSIS — C482 Malignant neoplasm of peritoneum, unspecified: Secondary | ICD-10-CM | POA: Diagnosis not present

## 2017-11-16 DIAGNOSIS — C569 Malignant neoplasm of unspecified ovary: Secondary | ICD-10-CM

## 2017-11-16 DIAGNOSIS — C786 Secondary malignant neoplasm of retroperitoneum and peritoneum: Secondary | ICD-10-CM | POA: Diagnosis not present

## 2017-11-16 DIAGNOSIS — Z90722 Acquired absence of ovaries, bilateral: Secondary | ICD-10-CM | POA: Diagnosis not present

## 2017-11-16 DIAGNOSIS — Z5111 Encounter for antineoplastic chemotherapy: Secondary | ICD-10-CM | POA: Diagnosis not present

## 2017-11-16 DIAGNOSIS — C561 Malignant neoplasm of right ovary: Secondary | ICD-10-CM | POA: Diagnosis not present

## 2017-11-16 DIAGNOSIS — Z90710 Acquired absence of both cervix and uterus: Secondary | ICD-10-CM | POA: Diagnosis not present

## 2017-11-16 NOTE — Progress Notes (Signed)
Gyn Onc Follow-up Note: Gyn-Onc  Consult was originally requested by Dr. Benay Spice for the evaluation of [name] 77 y.o. 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  CC:  Chief Complaint  Patient presents with  . Malignant neoplasm of ovary, unspecified laterality [facility])    Assessment/Plan:  [name]  is a 77 y.o.  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 BRCA negative.  Disease represented by small volume carcinomatosis and ascites and elevated CA 125. Agree with plan for continued Carboplatin. Continue chemotherapy with Dr Benay Spice, and to return to see me on a prn basis.  HPI: Lynn Ramirez is a very pleasant 77 year old woman (G3 P3) who is seen in consultation at the request of Dr. 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  The patient reports an episode of dyspnea requiring a chest x-ray in March 2016. She underwent chest x-ray which revealed a large right pleural effusion. An ultrasound guided thoracentesis on 09/30/2014 extracted 1.6 L of bloody fluid and cytology revealed carcinoma consistent on immunostains with gynecologic primary.  The CT scan showed a large omental cake, but normal ovaries and uterus. There was moderate ascites throughout the abdomen. He was a large right pleural effusion. There was some thickening of the small bowel mesentery and bowel wall.  She underwent CA-125 evaluation on 10/14/2014 and this was elevated at 4121 units per milliliter.  Of note she had a colonoscopy in [place] 2016 but there was no ability to visualize beyond the sigmoid colon secondary to sigmoid colon spasming. This had happened 10 years previously. Therefore the patient has never had a colonoscopy take evaluation of her colon proximal to the sigmoid.  She has a history of a left nephrectomy for congenital abnormality.  She  Underwent 3 cycles of carboplatin and paclitaxel chemotherapy with  Dr. Benay Spice with her third cycle being administered on 12/09/2014. CT imaging of the abdomen and pelvis to monitor for response was performed on 12/23/2014 and revealed: IMPRESSION: 1. Interval improvement in peritoneal carcinomatosis with near-complete resolution of ascites and decreased omental nodularity. 2. Significant improvement in malignant right pleural effusion. A small mildly complex right pleural effusion remains. 3. No other evidence of metastatic disease. No adnexal mass identified. 4. Stable cholelithiasis, left adrenal adenoma and atherosclerosis  CA 125 was initially elevated at 4121 in April at commencement of chemotherapy. It was decreased to 337U/mL at cycle 3.  On 12/29/14 she underwent a robotic assisted hysterectomy, BSO, omentectomy and radical tumor debulking. Disease was found in an omental cake, millial tumor deposits on the small intestine and in the ovaries and fallopian tubes. She required a minilaparotomy for resection of the omental cake. Cytoreduction was optimal with residual disease remaining only in the 38m implants on the small intestines. She had an uncomplicated postoperative course.  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 It involved both ovaries and the omentum.  Interval History:  The patient completed an additional 3 cycles of carboplatin and paclitaxel completed on 03/10/15.   Recurrence diagnosed 08/09/15 at the time of cholecystectomy (peritoneal studding seen and biopsied). CT 10/07/15 showed sigmoid colon stricturing, omental nodularity. Weekly Taxol salvage chemotherapy was started 10/13/15.  This was discontinued in January, 2018 with CT improvement.   Recurrence confirmed in March, 2018 with increasing CA 125. Taxol/Avastin started.  CT March, 2019 suggested progression with trace ascites increased. CA 125 rose to >1000. On 10/24/17, therapy changed to single agent carboplatin. CA  125 has been  falling since that time consistent with response.   She comes today to be evaluated for pelvic exam.      Current Meds:  Outpatient Encounter Medications as of 11/16/2017  Medication Sig  . Acetaminophen (TYLENOL) 325 MG CAPS Take 1 tablet by mouth 3 (three) times daily as needed (pain).   Marland Kitchen albuterol (PROVENTIL) (2.5 MG/3ML) 0.083% nebulizer solution Take 3 mLs (2.5 mg total) by nebulization every 6 (six) hours as needed for wheezing or shortness of breath.  Marland Kitchen antiseptic oral rinse (BIOTENE) LIQD 15 mLs by Mouth Rinse route 3 (three) times daily.  . ARNUITY ELLIPTA 200 MCG/ACT AEPB INHALE 1 PUFF INTO THE LUNGS DAILY  . atorvastatin (LIPITOR) 10 MG tablet TAKE 1 TABLET(10 MG) BY MOUTH DAILY  . benzonatate (TESSALON) 200 MG capsule Take 1 capsule (200 mg total) by mouth 3 (three) times daily as needed for cough.  . Biotin 2500 MCG CAPS Take 1 tablet by mouth daily.  . carvedilol (COREG) 3.125 MG tablet TAKE 1 TABLET(3.125 MG) BY MOUTH TWICE DAILY WITH A MEAL  . cholecalciferol (VITAMIN D) 1000 units tablet Take 1,000 Units by mouth daily.  . diphenoxylate-atropine (LOMOTIL) 2.5-0.025 MG tablet TAKE 2 TABLETS BY MOUTH FOUR TIMES DAILY AS NEEDED FOR DIARRHEA/ LOOSE STOOLS  . esomeprazole (NEXIUM) 40 MG capsule Take 1 capsule (40 mg total) by mouth daily.  . fluocinonide cream (LIDEX) 7.86 % Apply 1 application topically 2 (two) times daily as needed (For ezcema).   . furosemide (LASIX) 20 MG tablet Take 1 tablet (20 mg total) by mouth daily as needed for fluid or edema.  . gabapentin (NEURONTIN) 300 MG capsule TAKE 1 CAPSULE BY MOUTH EVERY NIGHT AT BEDTIME  . lidocaine-prilocaine (EMLA) cream Apply to port site one hour prior to use. Do not rub in. Cover with plastic.  Marland Kitchen loratadine (CLARITIN) 10 MG tablet Take 10 mg by mouth daily.  . ondansetron (ZOFRAN-ODT) 4 MG disintegrating tablet Take 1 tablet (4 mg total) by mouth every 8 (eight) hours as needed for nausea or vomiting.  Marland Kitchen  oxyCODONE-acetaminophen (PERCOCET/ROXICET) 5-325 MG tablet Take 1 tablet by mouth every 6 (six) hours as needed for severe pain.  Marland Kitchen PROAIR HFA 108 (90 Base) MCG/ACT inhaler INHALE 2 PUFFS INTO THE LUNGS FOUR TIMES DAILY AS NEEDED FOR WHEEZING  . pyridoxine (B-6) 100 MG tablet Take 100 mg by mouth daily.  . sorbitol 70 % solution Take 30 cc twice daily until bowel movement then once daily  . STIOLTO RESPIMAT 2.5-2.5 MCG/ACT AERS INHALE 2 PUFFS INTO THE LUNGS DAILY  . traMADol (ULTRAM) 50 MG tablet Take 1 tablet (50 mg total) by mouth every 12 (twelve) hours as needed. for pain  . VIRTUSSIN A/C 100-10 MG/5ML syrup TAKE 10 ML BY MOUTH THREE TIMES DAILY AS NEEDED FOR COUGH  . vitamin B-12 (CYANOCOBALAMIN) 100 MCG tablet Take 100 mcg by mouth daily. Reported on 01/19/2016  . zolpidem (AMBIEN) 5 MG tablet Take 1 tablet (5 mg total) by mouth at bedtime as needed for sleep.  . [DISCONTINUED] diazepam (VALIUM) 2 MG tablet Take 1 tablet (2 mg total) by mouth every 6 (six) hours as needed for anxiety (spasms). (Patient not taking: Reported on 11/16/2017)  . [DISCONTINUED] diclofenac sodium (VOLTAREN) 1 % GEL Apply 4 g topically 4 (four) times daily. (Patient not taking: Reported on 11/16/2017)   No facility-administered encounter medications on file as of 11/16/2017.     Allergy:  Allergies  Allergen Reactions  .  Latex Rash    Latex gloves ONLY (pt cannot wear them- no reaction if someone else touches her with latex gloves on)  . Penicillins Other (See Comments)    Has patient had a PCN reaction causing immediate rash, facial/tongue/throat swelling, SOB or lightheadedness with hypotension: Yes  Has patient had a PCN reaction causing severe rash involving mucus membranes or skin necrosis:No Has patient had a PCN reaction that required hospitalization:No Has patient had a PCN reaction occurring within the last 10 years:No If all of the above answers are "NO", then may proceed with Cephalosporin use. welps     Social Hx:   Social History   Socioeconomic History  . Marital status: Married    Spouse name: Not on file  . Number of children: 3  . Years of education: Not on file  . Highest education level: Not on file  Occupational History  . Occupation: RETIRED    Employer: RETIRED  Social Needs  . Financial resource strain: Not on file  . Food insecurity:    Worry: Not on file    Inability: Not on file  . Transportation needs:    Medical: Not on file    Non-medical: Not on file  Tobacco Use  . Smoking status: Former Smoker    Packs/day: 1.00    Years: 40.00    Pack years: 40.00    Types: Cigarettes    Last attempt to quit: 07/10/2013    Years since quitting: 4.3  . Smokeless tobacco: Never Used  Substance and Sexual Activity  . Alcohol use: Yes    Alcohol/week: 0.0 oz    Comment: 1-2 a week  . Drug use: No  . Sexual activity: Not on file  Lifestyle  . Physical activity:    Days per week: Not on file    Minutes per session: Not on file  . Stress: Not on file  Relationships  . Social connections:    Talks on phone: Not on file    Gets together: Not on file    Attends religious service: Not on file    Active member of club or organization: Not on file    Attends meetings of clubs or organizations: Not on file    Relationship status: Not on file  . Intimate partner violence:    Fear of current or ex partner: Not on file    Emotionally abused: Not on file    Physically abused: Not on file    Forced sexual activity: Not on file  Other Topics Concern  . Not on file  Social History Narrative  . Not on file    Past Surgical Hx:  Past Surgical History:  Procedure Laterality Date  . ABDOMINAL HYSTERECTOMY    . CHOLECYSTECTOMY N/A 08/09/2015   Procedure: Attempted LAPAROSCOPIC coverted  open CHOLECYSTECTOMY WITH INTRAOPERATIVE CHOLANGIOGRAM;  Surgeon: Autumn Messing III, MD;  Location: [facility];  Service: General;  Laterality: N/A;  . HAND SURGERY Right 1980's  . LAPAROTOMY  N/A 12/29/2014   Procedure:  LAPAROTOMY;  Surgeon: Everitt Amber, MD;  Location: [facility];  Service: Gynecology;  Laterality: N/A;  . NEPHRECTOMY    . ROBOTIC ASSISTED TOTAL HYSTERECTOMY WITH BILATERAL SALPINGO OOPHERECTOMY Bilateral 12/29/2014   Procedure: ROBOTIC ASSISTED TOTAL LAPAROSCOPIC HYSTERECTOMY WITH BILATERAL SALPINGO OOPHORECTOMY AND OOMENTECTOMY WITH RADICAL TUMOR [place] ;  Surgeon: Everitt Amber, MD;  Location: [facility];  Service: Gynecology;  Laterality: Bilateral;  . THORACENTESIS     several  . TONSILLECTOMY    .  TUBAL LIGATION      Past Medical Hx:  Past Medical History:  Diagnosis Date  . CKD (chronic kidney disease), stage II   . COPD (chronic obstructive pulmonary disease) ([place])   . Coronary artery calcification seen on CT scan   . Difficulty sleeping   . Eczema    hands  . Emphysema   . GERD (gastroesophageal reflux disease)   . H/O hydronephrosis   . History of transfusion    age 89  . Hyperlipidemia   . Hypertension   . Ovarian cancer ([place]) dx'd 09/2014   metastatic - prior malignant R pleural effusion and peritoneal carcinomatosis  . S/p nephrectomy     Past Gynecological History:  G3P3, tubal ligation.  No LMP recorded. Patient has had a hysterectomy.  Family Hx:  Family History  Problem Relation Age of Onset  . Diabetes Father   . Lung cancer Father 33       metastasis to liver  . Cancer Father        liver  . Pancreatic cancer Mother 64  . Stroke Paternal Grandfather   . Heart disease Maternal Aunt   . Diabetes Paternal Uncle   . Heart Problems Maternal Grandmother   . Heart Problems Maternal Grandfather   . Diabetes Paternal Grandmother   . Stroke Paternal Grandmother   . Heart Problems Paternal Uncle   . Cancer Cousin        unknown type  . Breast cancer Cousin        dx. 8s  . Leukemia Cousin        dx. 16-17  . Colon cancer Neg Hx   . Stomach cancer Neg Hx     Review of Systems:  Constitutional  Feels fatigued  ENT Normal  appearing ears and nares bilaterally Skin/Breast  No rash, sores, jaundice, itching, dryness Cardiovascular  No chest pain, or edema  Pulmonary  No cough or wheeze. +shortness of breath,  Gastro Intestinal  No nausea, vomitting, or diarrhoea. No bright red blood per rectum, no abdominal pain, change in bowel movement, or constipation. See HPI Genito Urinary  No frequency, urgency, dysuria, see HPI Musculo Skeletal  + peripheral edema Neurologic  No weakness, numbness, change in gait,  Psychology  No depression, anxiety, insomnia.   Vitals:  Blood pressure (!) 128/59, pulse 66, temperature 98.3 F (36.8 C), temperature source Oral, resp. rate 20, height _0  (1.549 m), weight 184 lb (83.5 kg), SpO2 98 %.  Physical Exam: WD in NAD Neck  Supple NROM, without any enlargements.  Lymph Node Survey No cervical supraclavicular or inguinal adenopathy Cardiovascular  Pulse normal rate, regularity and rhythm. S1 and S2 normal.  Lungs  Clear to auscultation bilateraly, without wheezes/crackles/rhonchi. Good air movement. No pleural effusion appreciated.  Skin  No rash/lesions/breakdown  Psychiatry  Alert and oriented to person, place, and time  Abdomen  Normoactive bowel sounds, abdomen soft, non-tender and overweight, no distention, without evidence of hernia. Incisions well healed. No palpable ascites or masses. Back No CVA tenderness Genito Urinary : vaginal cuff in tact, subtle nodularity in the posterior cul de sac Extremities  No bilateral cyanosis, clubbing or edema.    I spent 30 minutes in face to face counseling with the patient regarding her diagnosis and prognosis.  Thereasa Solo, MD   11/16/2017, 5:13 PM

## 2017-11-16 NOTE — Patient Instructions (Addendum)
Please call Dr Serita Grit office at [phone] to schedule follow-up after you have completed your course of carboplatin chemotherapy and your numbers have improved.  She can feel some nodularity on internal exam which should be treated with the carboplatin.    Protein Content in Foods Generally, most healthy people need around 50 grams of protein each day. Depending on your overall health, you may need more or less protein in your diet. Talk to your health care provider or dietitian about how much protein you need. See the following list for the protein content of some common foods. High-protein foods High-protein foods contain 4 grams (4 g) or more of protein per serving. They include:  Beef, ground sirloin (cooked) - 3 oz have 24 g of protein.  Cheese (hard) - 1 oz has 7 g of protein.  Chicken breast, boneless and skinless (cooked) - 3 oz have 13.4 g of protein.  Cottage cheese - 1/2 cup has 13.4 g of protein.  Egg - 1 egg has 6 g of protein.  Fish, filet (cooked) - 1 oz has 6-7 g of protein.  Garbanzo beans (canned or cooked) - 1/2 cup has 6-7 g of protein.  Kidney beans (canned or cooked) - 1/2 cup has 6-7 g of protein.  Lamb (cooked) - 3 oz has 24 g of protein.  Milk - 1 cup (8 oz) has 8 g of protein.  Nuts (peanuts, pistachios, almonds) - 1 oz has 6 g of protein.  Peanut butter - 1 oz has 7-8 g of protein.  Pork tenderloin (cooked) - 3 oz has 18.4 g of protein.  Pumpkin seeds - 1 oz has 8.5 g of protein.  Soybeans (roasted) - 1 oz has 8 g of protein.  Soybeans (cooked) - 1/2 cup has 11 g of protein.  Soy milk - 1 cup (8 oz) has 5-10 g of protein.  Soy or vegetable patty - 1 patty has 11 g of protein.  Sunflower seeds - 1 oz has 5.5 g of protein.  Tofu (firm) - 1/2 cup has 20 g of protein.  Tuna (canned in water) - 3 oz has 20 g of protein.  Yogurt - 6 oz has 8 g of protein.  Low-protein foods Low-protein foods contain 3 grams (3 g) or less of protein per  serving. They include:  Beets (raw or cooked) - 1/2 cup has 1.5 g of protein.  Bran cereal - 1/2 cup has 2-3 g of protein.  Bread - 1 slice has 2.5 g of protein.  Broccoli (raw or cooked) - 1/2 cup has 2 g of protein.  Collard greens (raw or cooked) - 1/2 cup has 2 g of protein.  Corn (fresh or cooked) - 1/2 cup has 2 g of protein.  Cream cheese - 1 oz has 2 g of protein.  Creamer (half-and-half) - 1 oz has 1 g of protein.  Flour tortilla - 1 tortilla has 2.5 g of protein  Frozen yogurt - 1/2 cup has 3 g of protein.  Fruit or vegetable juice - 1/2 cup has 1 g of protein.  Green beans (raw or cooked) - 1/2 cup has 1 g of protein.  Green peas (canned) - 1/2 cup has 3.5 g of protein.  Muffins - 1 small muffin (2 oz) has 3 g of protein.  Oatmeal (cooked) - 1/2 cup has 3 g of protein.  Potato (baked with skin) - 1 medium potato has 3 g of protein.  Rice (cooked) -  1/2 cup has 2.5-3.5 g of protein.  Sour cream - 1/2 cup has 2.5 g of protein.  Spinach (cooked) - 1/2 cup has 3 g of protein.  Squash (cooked) - 1/2 cup has 1.5 g of protein.  Actual amounts of protein may be different depending on processing. Talk with your health care provider or dietitian about what foods are recommended for you. This information is not intended to replace advice given to you by your health care provider. Make sure you discuss any questions you have with your health care provider. Document Released: 09/25/2015 Document Revised: 03/06/2016 Document Reviewed: 03/06/2016 Elsevier Interactive Patient Education  [employer].

## 2017-11-21 ENCOUNTER — Ambulatory Visit: Payer: Medicare Other

## 2017-11-21 ENCOUNTER — Other Ambulatory Visit: Payer: Medicare Other

## 2017-11-21 ENCOUNTER — Ambulatory Visit: Payer: Medicare Other | Admitting: Oncology

## 2017-11-22 ENCOUNTER — Inpatient Hospital Stay: Payer: Medicare Other

## 2017-11-22 ENCOUNTER — Encounter: Payer: Self-pay | Admitting: Emergency Medicine

## 2017-11-22 DIAGNOSIS — C786 Secondary malignant neoplasm of retroperitoneum and peritoneum: Secondary | ICD-10-CM | POA: Diagnosis not present

## 2017-11-22 DIAGNOSIS — C482 Malignant neoplasm of peritoneum, unspecified: Secondary | ICD-10-CM | POA: Diagnosis not present

## 2017-11-22 DIAGNOSIS — C5700 Malignant neoplasm of unspecified fallopian tube: Secondary | ICD-10-CM

## 2017-11-22 DIAGNOSIS — Z90710 Acquired absence of both cervix and uterus: Secondary | ICD-10-CM | POA: Diagnosis not present

## 2017-11-22 DIAGNOSIS — Z5111 Encounter for antineoplastic chemotherapy: Secondary | ICD-10-CM | POA: Diagnosis not present

## 2017-11-22 DIAGNOSIS — Z90722 Acquired absence of ovaries, bilateral: Secondary | ICD-10-CM | POA: Diagnosis not present

## 2017-11-22 DIAGNOSIS — C561 Malignant neoplasm of right ovary: Secondary | ICD-10-CM | POA: Diagnosis not present

## 2017-11-22 LAB — CBC WITH DIFFERENTIAL (CANCER CENTER ONLY)
Basophils Absolute: 0.1 10*3/uL (ref 0.0–0.1)
Basophils Relative: 1 %
Eosinophils Absolute: 0 10*3/uL (ref 0.0–0.5)
Eosinophils Relative: 0 %
HEMATOCRIT: 26.9 % — AB (ref 34.8–46.6)
Hemoglobin: 8.8 g/dL — ABNORMAL LOW (ref 11.6–15.9)
LYMPHS ABS: 0.7 10*3/uL — AB (ref 0.9–3.3)
LYMPHS PCT: 10 %
MCH: 28.2 pg (ref 25.1–34.0)
MCHC: 32.5 g/dL (ref 31.5–36.0)
MCV: 86.8 fL (ref 79.5–101.0)
MONOS PCT: 9 %
Monocytes Absolute: 0.6 10*3/uL (ref 0.1–0.9)
NEUTROS ABS: 5.4 10*3/uL (ref 1.5–6.5)
Neutrophils Relative %: 80 %
Platelet Count: 383 10*3/uL (ref 145–400)
RBC: 3.1 MIL/uL — AB (ref 3.70–5.45)
RDW: 21.9 % — ABNORMAL HIGH (ref 11.2–14.5)
WBC: 6.7 10*3/uL (ref 3.9–10.3)

## 2017-11-22 LAB — SAMPLE TO BLOOD BANK

## 2017-11-22 NOTE — Progress Notes (Signed)
MD made aware of pts hgb 8.8. No transfusion needed at this time

## 2017-11-23 ENCOUNTER — Other Ambulatory Visit: Payer: Self-pay | Admitting: *Deleted

## 2017-11-23 DIAGNOSIS — C482 Malignant neoplasm of peritoneum, unspecified: Secondary | ICD-10-CM

## 2017-11-23 LAB — CA 125: Cancer Antigen (CA) 125: 900.7 U/mL — ABNORMAL HIGH (ref 0.0–38.1)

## 2017-11-26 ENCOUNTER — Telehealth: Payer: Self-pay

## 2017-11-26 ENCOUNTER — Telehealth: Payer: Self-pay | Admitting: Oncology

## 2017-11-26 NOTE — Telephone Encounter (Signed)
Scheduled apt per 5/17 sch message - left message with appt date and time.

## 2017-11-26 NOTE — Telephone Encounter (Signed)
Call from pt inquiring about CA-125 testing. Pt states "they drew a tube of blood last time for the CEA, but I didn't think I was supposed to have it done yet". Per MD, will evaluate CEA based on results from upcoming labs on 5/28. Pt also voiced desire to cancel flush appt and have labs drawn peripherally. This RN voiced understanding. Flush appt cancelled and not added to lab appt to draw labs peripherally.

## 2017-11-27 ENCOUNTER — Other Ambulatory Visit: Payer: Self-pay | Admitting: Cardiology

## 2017-11-27 DIAGNOSIS — M7989 Other specified soft tissue disorders: Secondary | ICD-10-CM

## 2017-11-27 DIAGNOSIS — I2510 Atherosclerotic heart disease of native coronary artery without angina pectoris: Secondary | ICD-10-CM

## 2017-11-27 DIAGNOSIS — M79604 Pain in right leg: Secondary | ICD-10-CM

## 2017-11-27 DIAGNOSIS — C799 Secondary malignant neoplasm of unspecified site: Secondary | ICD-10-CM

## 2017-11-27 DIAGNOSIS — I2583 Coronary atherosclerosis due to lipid rich plaque: Secondary | ICD-10-CM

## 2017-11-27 DIAGNOSIS — E7849 Other hyperlipidemia: Secondary | ICD-10-CM

## 2017-11-29 ENCOUNTER — Telehealth: Payer: Self-pay

## 2017-11-29 NOTE — Telephone Encounter (Signed)
Spoke with pt regarding lab results. Pt informed per Dr. Benay Spice that the increase in CA-125 from last week is not to be regarded or worried about due to it being early in treatment course. Will recheck CA-125 next week and evaluate. Pt voiced concern about increase in value. Declined to speak with Dr. Benay Spice, will wait until next week at appt. This RN voiced understanding.

## 2017-12-03 ENCOUNTER — Other Ambulatory Visit: Payer: Self-pay | Admitting: Oncology

## 2017-12-04 ENCOUNTER — Inpatient Hospital Stay: Payer: Medicare Other

## 2017-12-04 ENCOUNTER — Other Ambulatory Visit: Payer: Medicare Other

## 2017-12-04 DIAGNOSIS — C482 Malignant neoplasm of peritoneum, unspecified: Secondary | ICD-10-CM

## 2017-12-04 DIAGNOSIS — Z5111 Encounter for antineoplastic chemotherapy: Secondary | ICD-10-CM | POA: Diagnosis not present

## 2017-12-04 DIAGNOSIS — C5700 Malignant neoplasm of unspecified fallopian tube: Secondary | ICD-10-CM

## 2017-12-04 DIAGNOSIS — Z90722 Acquired absence of ovaries, bilateral: Secondary | ICD-10-CM | POA: Diagnosis not present

## 2017-12-04 DIAGNOSIS — Z90710 Acquired absence of both cervix and uterus: Secondary | ICD-10-CM | POA: Diagnosis not present

## 2017-12-04 DIAGNOSIS — C786 Secondary malignant neoplasm of retroperitoneum and peritoneum: Secondary | ICD-10-CM | POA: Diagnosis not present

## 2017-12-04 DIAGNOSIS — C561 Malignant neoplasm of right ovary: Secondary | ICD-10-CM | POA: Diagnosis not present

## 2017-12-04 LAB — CBC WITH DIFFERENTIAL (CANCER CENTER ONLY)
Basophils Absolute: 0 10*3/uL (ref 0.0–0.1)
Basophils Relative: 0 %
EOS PCT: 1 %
Eosinophils Absolute: 0 10*3/uL (ref 0.0–0.5)
HEMATOCRIT: 24.8 % — AB (ref 34.8–46.6)
Hemoglobin: 7.4 g/dL — ABNORMAL LOW (ref 11.6–15.9)
LYMPHS PCT: 24 %
Lymphs Abs: 1.2 10*3/uL (ref 0.9–3.3)
MCH: 27.4 pg (ref 25.1–34.0)
MCHC: 29.8 g/dL — ABNORMAL LOW (ref 31.5–36.0)
MCV: 91.9 fL (ref 79.5–101.0)
MONO ABS: 0.7 10*3/uL (ref 0.1–0.9)
MONOS PCT: 15 %
NEUTROS ABS: 3 10*3/uL (ref 1.5–6.5)
Neutrophils Relative %: 60 %
Platelet Count: 120 10*3/uL — ABNORMAL LOW (ref 145–400)
RBC: 2.7 MIL/uL — AB (ref 3.70–5.45)
RDW: 24.6 % — AB (ref 11.2–14.5)
WBC Count: 5 10*3/uL (ref 3.9–10.3)

## 2017-12-04 LAB — CMP (CANCER CENTER ONLY)
ALBUMIN: 2.9 g/dL — AB (ref 3.5–5.0)
ALT: 12 U/L (ref 0–55)
AST: 20 U/L (ref 5–34)
Alkaline Phosphatase: 65 U/L (ref 40–150)
Anion gap: 12 — ABNORMAL HIGH (ref 3–11)
BUN: 14 mg/dL (ref 7–26)
CHLORIDE: 97 mmol/L — AB (ref 98–109)
CO2: 29 mmol/L (ref 22–29)
CREATININE: 0.96 mg/dL (ref 0.60–1.10)
Calcium: 8.4 mg/dL (ref 8.4–10.4)
GFR, Est AFR Am: >60 mL/min (ref >60)
GFR, Estimated: 57 mL/min — ABNORMAL LOW (ref >60)
GLUCOSE: 102 mg/dL (ref 70–140)
POTASSIUM: 3.9 mmol/L (ref 3.5–5.1)
Sodium: 138 mmol/L (ref 136–145)
Total Bilirubin: 0.6 mg/dL (ref 0.2–1.2)
Total Protein: 7.6 g/dL (ref 6.4–8.3)

## 2017-12-05 ENCOUNTER — Inpatient Hospital Stay: Payer: Medicare Other

## 2017-12-05 ENCOUNTER — Other Ambulatory Visit: Payer: Medicare Other

## 2017-12-05 ENCOUNTER — Inpatient Hospital Stay (HOSPITAL_BASED_OUTPATIENT_CLINIC_OR_DEPARTMENT_OTHER): Payer: Medicare Other | Admitting: Oncology

## 2017-12-05 ENCOUNTER — Telehealth: Payer: Self-pay | Admitting: Oncology

## 2017-12-05 VITALS — BP 117/58 | HR 99 | Temp 97.6°F | Resp 18 | Wt 180.2 lb

## 2017-12-05 DIAGNOSIS — C5700 Malignant neoplasm of unspecified fallopian tube: Secondary | ICD-10-CM

## 2017-12-05 DIAGNOSIS — K5900 Constipation, unspecified: Secondary | ICD-10-CM

## 2017-12-05 DIAGNOSIS — C786 Secondary malignant neoplasm of retroperitoneum and peritoneum: Secondary | ICD-10-CM | POA: Diagnosis not present

## 2017-12-05 DIAGNOSIS — Z90722 Acquired absence of ovaries, bilateral: Secondary | ICD-10-CM | POA: Diagnosis not present

## 2017-12-05 DIAGNOSIS — D649 Anemia, unspecified: Secondary | ICD-10-CM

## 2017-12-05 DIAGNOSIS — Z90710 Acquired absence of both cervix and uterus: Secondary | ICD-10-CM | POA: Diagnosis not present

## 2017-12-05 DIAGNOSIS — R3911 Hesitancy of micturition: Secondary | ICD-10-CM

## 2017-12-05 DIAGNOSIS — R300 Dysuria: Secondary | ICD-10-CM

## 2017-12-05 DIAGNOSIS — C482 Malignant neoplasm of peritoneum, unspecified: Secondary | ICD-10-CM | POA: Diagnosis not present

## 2017-12-05 DIAGNOSIS — I2510 Atherosclerotic heart disease of native coronary artery without angina pectoris: Secondary | ICD-10-CM

## 2017-12-05 DIAGNOSIS — Z5111 Encounter for antineoplastic chemotherapy: Secondary | ICD-10-CM | POA: Diagnosis not present

## 2017-12-05 DIAGNOSIS — C561 Malignant neoplasm of right ovary: Secondary | ICD-10-CM | POA: Diagnosis not present

## 2017-12-05 LAB — URINALYSIS, COMPLETE (UACMP) WITH MICROSCOPIC
GLUCOSE, UA: NEGATIVE mg/dL
KETONES UR: 5 mg/dL — AB
NITRITE: NEGATIVE
PROTEIN: 100 mg/dL — AB
Specific Gravity, Urine: 1.019 (ref 1.005–1.030)
WBC, UA: >50 WBC/hpf — ABNORMAL HIGH (ref 0–5)
pH: 5 (ref 5.0–8.0)

## 2017-12-05 LAB — CA 125: Cancer Antigen (CA) 125: 937.3 U/mL — ABNORMAL HIGH (ref 0.0–38.1)

## 2017-12-05 LAB — PREPARE RBC (CROSSMATCH)

## 2017-12-05 LAB — SAMPLE TO BLOOD BANK

## 2017-12-05 LAB — ABO/RH: ABO/RH(D): O NEG

## 2017-12-05 MED ORDER — HEPARIN SOD (PORK) LOCK FLUSH 100 UNIT/ML IV SOLN
250.0000 [IU] | INTRAVENOUS | Status: AC | PRN
  Administered 2017-12-05: 250 [IU]
  Filled 2017-12-05: qty 5

## 2017-12-05 MED ORDER — SODIUM CHLORIDE 0.9% FLUSH
3.0000 mL | INTRAVENOUS | Status: AC | PRN
  Administered 2017-12-05: 3 mL
  Filled 2017-12-05: qty 10

## 2017-12-05 MED ORDER — ONDANSETRON 4 MG PO TBDP
4.0000 mg | ORAL_TABLET | Freq: Three times a day (TID) | ORAL | 1 refills | Status: AC | PRN
Start: 2017-12-05 — End: ?

## 2017-12-05 MED ORDER — SODIUM CHLORIDE 0.9 % IV SOLN: 250.0000 mL | Freq: Once | INTRAVENOUS | Status: DC

## 2017-12-05 MED ORDER — CIPROFLOXACIN HCL 500 MG PO TABS: 500.0000 mg | ORAL_TABLET | Freq: Two times a day (BID) | ORAL | 0 refills | Status: DC

## 2017-12-05 NOTE — Progress Notes (Signed)
Lynn Ramirez OFFICE PROGRESS NOTE   Diagnosis: Fallopian tube carcinoma  INTERVAL HISTORY:   Lynn Ramirez completed another cycle of carboplatin on 11/14/2017.  She had nausea following chemotherapy.  No emesis.  She otherwise tolerated the chemotherapy well. She has noted recent constipation.  She developed severe pain at the right posterior iliac and right groin region last night.  This has persisted today.  she had an episode of urinary hesitancy at the cancer center today. She reports malaise and her dyspnea is worse.  Objective:  Vital signs in last 24 hours:  Blood pressure (!) 117/58, pulse 99, temperature 97.6 F (36.4 C), temperature source Oral, resp. rate 18, weight 180 lb 3.2 oz (81.7 kg), SpO2 94 %.    HEENT: No thrush or ulcers Resp: Distant breath sounds, no respiratory distress Cardio: Regular rate and rhythm  GI:, Tender in the right lower abdomen/groin, no mass, no hepatomegaly, no apparent ascites Vascular: No leg edema Musculoskeletal: Tender at the right posterior iliac, no mass Skin: No rash  Portacath/PICC-without erythema  Lab Results:  Lab Results  Component Value Date   WBC 5.0 12/04/2017   HGB 7.4 (L) 12/04/2017   HCT 24.8 (L) 12/04/2017   MCV 91.9 12/04/2017   PLT 120 (L) 12/04/2017   NEUTROABS 3.0 12/04/2017    CMP  Lab Results  Component Value Date   NA 138 12/04/2017   K 3.9 12/04/2017   CL 97 (L) 12/04/2017   CO2 29 12/04/2017   GLUCOSE 102 12/04/2017   BUN 14 12/04/2017   CREATININE 0.96 12/04/2017   CALCIUM 8.4 12/04/2017   PROT 7.6 12/04/2017   ALBUMIN 2.9 (L) 12/04/2017   AST 20 12/04/2017   ALT 12 12/04/2017   ALKPHOS 65 12/04/2017   BILITOT 0.6 12/04/2017   GFRNONAA 57 (L) 12/04/2017   GFRAA >60 12/04/2017    Ca125 on 11/26/2017: 937  Medications Ms. Sweeney has completed 2 cycles of carboplatin.: I have reviewed the patient's current medications.   Assessment/Plan: 1. Malignant right pleural  effusion-cytology revealed metastatic adenocarcinoma with papillary features, immunohistochemical profile consistent with a GYN primary, elevated CA 125   Staging CTs of the chest, abdomen, and pelvis on 10/06/2014 revealed a loculated right pleural effusion, ascites, and omental/mesenteric thickening   Cytology from peritoneal fluid 10/13/2014 revealed malignant cells consistent with metastatic adenocarcinoma Biopsy of an omental mass on 11/02/2014 revealed invasive serous carcinoma with psammoma bodies Cycle 1 Taxol/carboplatin 10/28/2014 Cycle 2 Taxol/carboplatin 11/18/2014 Cycle 3 Taxol/carboplatin 12/09/2014  CT scan 12/23/2014 with interval improvement in peritoneal carcinomatosis with near-complete resolution of ascites and decreased omental nodularity. Significant improvement in malignant right pleural effusion.  Status post robotic-assisted laparoscopic hysterectomy with bilateral salpingoophorectomy, omentectomy, radical tumor debulking 12/29/2014. Per Dr. Serita Grit office note 01/14/2015 cytoreduction was optimal with residual disease remaining only in the 1 mm implants on the small intestine. 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  MSI-stable, mutation burden-4, BRAF rearrangement,ERBB4, KRAS G12D Cycle 1 adjuvant Taxol/carboplatin 01/20/2015 Cycle 2 adjuvant Taxol/carboplatin 02/10/2015 Cycle 3 adjuvant Taxol/carboplatin 03/10/2015 CA 125 on 1013 2016-42  CTs of the chest, abdomen, and pelvis 05/31/2015 with no evidence of progressive ovarian cancer  CTs of the chest, abdomen,  and pelvis 08/07/2015 and 08/08/2015-no evidence of progressive ovarian cancer  Peritoneal studding noted  at the time of the cholecystectomy procedure 08/09/2015 Elevated CA 125 10/07/2015  CT 10/07/2015 with stricturing at the sigmoid colon, constipation, and omental nodularity  Initiation of salvage weekly Taxol chemotherapy 10/13/2015  Taxol changed to every 2 weeks beginning 01/19/2016 due to painful neuropathy.  CT scans 07/19/2016 with no acute findings. No features in the abdomen or pelvis to suggest recurrent disease.Stable mild fullness right intrarenal collecting system.  Elevated CA 125 treatment resumed with Taxol/Avastin on a 2 week schedule 09/27/2016  CT 02/12/2017-no evidence of carcinomatosis, no evidence of progressive metastatic disease  Taxol/Avastin continued every 2 weeks  CT 09/10/2017-increase in trace ascites, no evidence of progressive carcinomatosis, inflammatory changes at the lung bases with a new 3 mm right lower lobe nodule  Taxol/Avastin continued every 2 weeks  Progressive rise in the CA 125  Cycle 1 carboplatin 3-week schedule 10/24/2017  Cycle 2 carboplatin 11/14/2017 2. COPD-followed by Dr. Lamonte Sakai. 3. Dyspnea secondary to COPD and the large right pleural effusion, status post therapeutic thoracentesis procedures 09/30/2014,10/09/2014, and 10/21/2014. Left thoracentesis 10/30/2014 4. Left nephrectomy as a child 5. Delayed nausea following cycle 1 Taxol/carboplatin, Aloxi/Emend added with cycle 2 with improvement. 6. Right lower extremity edema, right calf pain 12/09/2014. Negative venous Doppler 12/09/2014. 7. Diffuse pruritus following cycle 1 adjuvant Taxol/carboplatin 01/20/2015, no rash, resolved with a steroid dose pack 8. Thrombocytopenia second to chemotherapy, the carboplatin was dose reduced with cycle 2 adjuvant Taxol/carboplatin 02/10/2015 9. Chemotherapy-induced peripheral neuropathy-painful peripheral neuropathy involving the feet  01/19/2016.  10. Admission with acute cholecystitis 08/07/2015, status post a cholecystectomy 08/09/2015 11. Admission 10/08/2015 with abdominal pain/constipation, improved with bowel rest and laxatives 12. Mild right hydronephrosis noted on the CT 10/07/2015. Renal ultrasound 12/16/2015 with no hydronephrosis noted.No hydronephrosis on CT 07/19/2016.   Disposition: Lynn Ramirez has completed 2 cycles of carboplatin.  Her clinical status has declined over the past month.  She has symptomatic anemia.  The CA 125 remains elevated.  We discussed the indication for continuing carboplatin.  We decided to discontinue carboplatin given the low likelihood of clinical improvement with further carboplatin.  She has symptomatic anemia.  She will be transfused with packed red blood cells today.  She appears to have a urinary tract infection today.  She will begin a course of ciprofloxacin.  We will follow-up on the urine culture.  The etiology of the right iliac and right groin pain is unclear.  She will contact us if this does not improve over the next few days.  Ms. Dobies will return for an office and lab visit in 2 weeks.  25 minutes were spent with the patient today.  The majority of the time was used for counseling and coordination of care.  Betsy Coder, MD  12/05/2017  9:50 AM

## 2017-12-05 NOTE — Telephone Encounter (Signed)
Appointments scheduled AVS/Calendar printed per 5/29 los °

## 2017-12-05 NOTE — Patient Instructions (Signed)

## 2017-12-06 LAB — TYPE AND SCREEN
ABO/RH(D): O NEG
ANTIBODY SCREEN: NEGATIVE
UNIT DIVISION: 0
Unit division: 0

## 2017-12-06 LAB — BPAM RBC
Blood Product Expiration Date: 201906212359
Blood Product Expiration Date: 201906222359
ISSUE DATE / TIME: 201905291144
ISSUE DATE / TIME: 201905291144
UNIT TYPE AND RH: 9500
Unit Type and Rh: 9500

## 2017-12-07 LAB — URINE CULTURE: Culture: >=100000 — AB

## 2017-12-10 ENCOUNTER — Other Ambulatory Visit: Payer: Self-pay | Admitting: Oncology

## 2017-12-10 ENCOUNTER — Telehealth: Payer: Self-pay | Admitting: Cardiology

## 2017-12-10 DIAGNOSIS — C482 Malignant neoplasm of peritoneum, unspecified: Secondary | ICD-10-CM

## 2017-12-10 MED ORDER — FUROSEMIDE 40 MG PO TABS: 40.0000 mg | ORAL_TABLET | Freq: Every day | ORAL | 2 refills | Status: AC

## 2017-12-10 NOTE — Telephone Encounter (Signed)
She can increase to 40 mg daily, please advise to also increase foods rich in potassium.

## 2017-12-10 NOTE — Telephone Encounter (Signed)
Pt is calling in to let Dr Meda Coffee know that over the last week or so, she has had bilateral lower extremity edema.  Pt states she is no more sob than she usually is, for she has known COPD.  Pt has no other cardiac complaints.  Pt states she is no longer on chemotherapy.  Pt also mentioned that she is finishing up a weeks worth of antibiotics for known UTI.  Pt states her swelling increases throughout the day, and goes down by morning time.  Pt states that Dr Meda Coffee prescribed her lasix 20 mg po daily as needed for fluid retention.  Pt states that she has for the last week been taking this medication scheduled on a daily basis.  Pt states that she may need to have this med increased by Dr Meda Coffee.  Pt states that Dr Meda Coffee can review her last BUN and Creatinine, in her chart, for recent lab done for UTI.  Pt states her BUN and Creat was normal.  Pt also recently had an echo done by our office, and that came back normal.  Pt states that the only thing that seems to help her swelling, is when she drinks water, and elevates them while at rest.  Pt would like for Dr Meda Coffee to advise on what she should do.  Informed the pt that I will route this concern to Dr Meda Coffee for further review and recommendation, and follow-up with the pt shortly thereafter.  Pt verbalized understanding and agrees with this plan.

## 2017-12-10 NOTE — Telephone Encounter (Signed)
Spoke with the pt and informed her that per Dr Meda Coffee, she recommends that we increase her lasix to 40 mg po daily, and advised her that she needs to increase the K in her diet.  Pt education provided on how to increase the potassium in her diet.  Confirmed the pharmacy of choice with the pt.  Pt verbalized understanding and agrees with this plan.

## 2017-12-10 NOTE — Telephone Encounter (Signed)
New message    Pt c/o swelling: STAT is pt has developed SOB within 24 hours  How much weight have you gained and in what time span? 3-4 lb difference 1) If swelling, where is the swelling located? Bottom of leg and calf   2) Are you currently taking a fluid pill? Yes   3) Are you currently SOB? Has copd cant tell if its worse   Do you have a log of your daily weights (if so, list)?  no 4) Have you gained 3 pounds in a day or 5 pounds in a week? Not sure   5) Have you traveled recently? no  She has keeped her legs elevated yesterday and the swelling was down, now they are swollen and tight

## 2017-12-17 ENCOUNTER — Ambulatory Visit (INDEPENDENT_AMBULATORY_CARE_PROVIDER_SITE_OTHER)
Admission: RE | Admit: 2017-12-17 | Discharge: 2017-12-17 | Disposition: A | Discharge status: Home / Self Care | Payer: Medicare Other | Source: Ambulatory Visit | Attending: Adult Health | Admitting: Adult Health

## 2017-12-17 ENCOUNTER — Encounter: Payer: Self-pay | Admitting: Adult Health

## 2017-12-17 ENCOUNTER — Ambulatory Visit (INDEPENDENT_AMBULATORY_CARE_PROVIDER_SITE_OTHER): Payer: Medicare Other | Admitting: Adult Health

## 2017-12-17 ENCOUNTER — Other Ambulatory Visit (INDEPENDENT_AMBULATORY_CARE_PROVIDER_SITE_OTHER): Payer: Medicare Other

## 2017-12-17 VITALS — BP 116/72 | HR 95 | Ht 61.0 in | Wt 175.0 lb

## 2017-12-17 DIAGNOSIS — I5032 Chronic diastolic (congestive) heart failure: Secondary | ICD-10-CM | POA: Diagnosis not present

## 2017-12-17 DIAGNOSIS — J9611 Chronic respiratory failure with hypoxia: Secondary | ICD-10-CM

## 2017-12-17 DIAGNOSIS — J449 Chronic obstructive pulmonary disease, unspecified: Secondary | ICD-10-CM

## 2017-12-17 DIAGNOSIS — J90 Pleural effusion, not elsewhere classified: Secondary | ICD-10-CM | POA: Diagnosis not present

## 2017-12-17 DIAGNOSIS — I2510 Atherosclerotic heart disease of native coronary artery without angina pectoris: Secondary | ICD-10-CM

## 2017-12-17 LAB — CBC WITH DIFFERENTIAL/PLATELET
BASOS PCT: 0.2 % (ref 0.0–3.0)
Basophils Absolute: 0 10*3/uL (ref 0.0–0.1)
EOS PCT: 1.6 % (ref 0.0–5.0)
Eosinophils Absolute: 0.1 10*3/uL (ref 0.0–0.7)
HEMATOCRIT: 34.1 % — AB (ref 36.0–46.0)
Hemoglobin: 11.2 g/dL — ABNORMAL LOW (ref 12.0–15.0)
LYMPHS PCT: 19.1 % (ref 12.0–46.0)
Lymphs Abs: 1.4 10*3/uL (ref 0.7–4.0)
MCHC: 32.8 g/dL (ref 30.0–36.0)
MCV: 90 fl (ref 78.0–100.0)
MONOS PCT: 10.6 % (ref 3.0–12.0)
Monocytes Absolute: 0.8 10*3/uL (ref 0.1–1.0)
Neutro Abs: 5.1 10*3/uL (ref 1.4–7.7)
Neutrophils Relative %: 68.5 % (ref 43.0–77.0)
PLATELETS: 387 10*3/uL (ref 150.0–400.0)
RBC: 3.79 Mil/uL — ABNORMAL LOW (ref 3.87–5.11)
RDW: 21.2 % — AB (ref 11.5–15.5)
WBC: 7.5 10*3/uL (ref 4.0–10.5)

## 2017-12-17 LAB — BASIC METABOLIC PANEL
BUN: 14 mg/dL (ref 6–23)
CO2: 40 meq/L — AB (ref 19–32)
Calcium: 10.2 mg/dL (ref 8.4–10.5)
Chloride: 93 mEq/L — ABNORMAL LOW (ref 96–112)
Creatinine, Ser: 0.83 mg/dL (ref 0.40–1.20)
GFR: 71.29 mL/min (ref >60.00)
GLUCOSE: 105 mg/dL — AB (ref 70–99)
POTASSIUM: 3.9 meq/L (ref 3.5–5.1)
SODIUM: 139 meq/L (ref 135–145)

## 2017-12-17 LAB — BRAIN NATRIURETIC PEPTIDE: Pro B Natriuretic peptide (BNP): 24 pg/mL (ref 0.0–100.0)

## 2017-12-17 NOTE — Assessment & Plan Note (Signed)
Cont on o2 .  Keep sats >90%.

## 2017-12-17 NOTE — Assessment & Plan Note (Signed)
Does not appear to have exacerbation  Check cxr  Cont on Stioto and Arnuity

## 2017-12-17 NOTE — Assessment & Plan Note (Signed)
Suspect pt has some decompensated D CHF - lasix is helping  Check BNP  Low salt diet  Cont on lasix , labs today with bmet/bnp and cbc.

## 2017-12-17 NOTE — Addendum Note (Signed)
Addended by: Lorane Gell on: 12/17/2017 04:29 PM   Modules accepted: Orders

## 2017-12-17 NOTE — Patient Instructions (Signed)
Chest xray  And labs today .  Continue on Oxygen 2l/m.  Continue on Lasix 40mg  daily .  Follow up 3-4 weeks with .Dr. Lamonte Sakai or Doniven Vanpatten NP and As needed    Please contact office for sooner follow up if symptoms do not improve or worsen or seek emergency care

## 2017-12-17 NOTE — Progress Notes (Signed)
@Patient  ID: Lynn Ramirez, female    DOB: 07-25-1940, 77 y.o.   MRN: [id number]  Chief Complaint  Patient presents with  . Acute Visit    COPD    Referring provider: Midge Minium, MD  HPI: 77 year old female former smoker followed for COPD and chronic pleural effusions in the setting of ovarian cancer and chronic hypoxic respiratory failure on 2 L of oxygen  TEST  PFT 2013 showed FEV1 at 49%, ratio 46, FVC 75% DLCO 71% CT chest October 16, 2017 showed several 2 to 4 mm nodular opacities throughout the lungs Echo 10/2017 EF 60%, gr 1 DD    12/17/2017 Acute OV: COPD , O2 RF , ovarian cancer on chemo Presents for an acute office visit.  She complains of increased shortness of breath with activity that has been progressively getting worse for last 4 weeks. No increased cough or wheezing .  She remains on Stiolto and Arnuity Has had more swelling in ankles. She was started on Lasix 20mg   Daily   , last week was instructed to take lasix 40mg  daily . Has helped with leg swelling , wt is down 5 lbs. No change in her breathing still gets weak .   Has been anemic, had transfusion 2 weeks ago.  Going on vacation soon .   On break from chemo for 6 weeks . Last tx was 11/23/17. Has really been run down from chemo and anemia .       Allergies  Allergen Reactions  . Latex Rash    Latex gloves ONLY (pt cannot wear them- no reaction if someone else touches her with latex gloves on)  . Penicillins Other (See Comments)    Has patient had a PCN reaction causing immediate rash, facial/tongue/throat swelling, SOB or lightheadedness with hypotension: Yes  Has patient had a PCN reaction causing severe rash involving mucus membranes or skin necrosis:No Has patient had a PCN reaction that required hospitalization:No Has patient had a PCN reaction occurring within the last 10 years:No If all of the above answers are "NO", then may proceed with Cephalosporin use. welps    Immunization  History  Administered Date(s) Administered  . Influenza Split 03/26/2014  . Influenza Whole 04/09/2012  . Influenza, High Dose Seasonal PF 03/10/2013, 04/25/2017  . Influenza,inj,Quad PF,6+ Mos 05/06/2015, 04/12/2016  . Pneumococcal Conjugate-13 05/06/2015  . Pneumococcal Polysaccharide-23 05/17/2016  . Pneumococcal-Unspecified 07/10/2016    Past Medical History:  Diagnosis Date  . CKD (chronic kidney disease), stage II   . COPD (chronic obstructive pulmonary disease) ([place])   . Coronary artery calcification seen on CT scan   . Difficulty sleeping   . Eczema    hands  . Emphysema   . GERD (gastroesophageal reflux disease)   . H/O hydronephrosis   . History of transfusion    age 69  . Hyperlipidemia   . Hypertension   . Ovarian cancer ([place]) dx'd 09/2014   metastatic - prior malignant R pleural effusion and peritoneal carcinomatosis  . S/p nephrectomy     Tobacco History: Social History   Tobacco Use  Smoking Status Former Smoker  . Packs/day: 1.00  . Years: 40.00  . Pack years: 40.00  . Types: Cigarettes  . Last attempt to quit: 07/10/2013  . Years since quitting: 4.4  Smokeless Tobacco Never Used   Counseling given: Not Answered   Outpatient Encounter Medications as of 12/17/2017  Medication Sig  . Acetaminophen (TYLENOL) 325 MG CAPS Take 1 tablet by mouth 3 (  three) times daily as needed (pain).   Marland Kitchen albuterol (PROVENTIL) (2.5 MG/3ML) 0.083% nebulizer solution Take 3 mLs (2.5 mg total) by nebulization every 6 (six) hours as needed for wheezing or shortness of breath.  Marland Kitchen antiseptic oral rinse (BIOTENE) LIQD 15 mLs by Mouth Rinse route 3 (three) times daily.  . ARNUITY ELLIPTA 200 MCG/ACT AEPB INHALE 1 PUFF INTO THE LUNGS DAILY  . atorvastatin (LIPITOR) 10 MG tablet TAKE 1 TABLET(10 MG) BY MOUTH DAILY  . Biotin 2500 MCG CAPS Take 1 tablet by mouth daily.  . carvedilol (COREG) 3.125 MG tablet TAKE 1 TABLET(3.125 MG) BY MOUTH TWICE DAILY WITH A MEAL  . cholecalciferol  (VITAMIN D) 1000 units tablet Take 1,000 Units by mouth daily.  . diphenoxylate-atropine (LOMOTIL) 2.5-0.025 MG tablet TAKE 2 TABLETS BY MOUTH FOUR TIMES DAILY AS NEEDED FOR DIARRHEA/ LOOSE STOOLS  . esomeprazole (NEXIUM) 40 MG capsule Take 1 capsule (40 mg total) by mouth daily.  . fluocinonide cream (LIDEX) 4.78 % Apply 1 application topically 2 (two) times daily as needed (For ezcema).   . furosemide (LASIX) 40 MG tablet Take 1 tablet (40 mg total) by mouth daily.  Marland Kitchen gabapentin (NEURONTIN) 300 MG capsule TAKE 1 CAPSULE BY MOUTH EVERY NIGHT AT BEDTIME  . lidocaine-prilocaine (EMLA) cream Apply to port site one hour prior to use. Do not rub in. Cover with plastic.  Marland Kitchen loratadine (CLARITIN) 10 MG tablet Take 10 mg by mouth daily.  . ondansetron (ZOFRAN-ODT) 4 MG disintegrating tablet Take 1 tablet (4 mg total) by mouth every 8 (eight) hours as needed for nausea or vomiting.  Marland Kitchen oxyCODONE-acetaminophen (PERCOCET/ROXICET) 5-325 MG tablet Take 1 tablet by mouth every 6 (six) hours as needed for severe pain.  Marland Kitchen PROAIR HFA 108 (90 Base) MCG/ACT inhaler INHALE 2 PUFFS INTO THE LUNGS FOUR TIMES DAILY AS NEEDED FOR WHEEZING  . pyridoxine (B-6) 100 MG tablet Take 100 mg by mouth daily.  . sorbitol 70 % solution Take 30 cc twice daily until bowel movement then once daily  . STIOLTO RESPIMAT 2.5-2.5 MCG/ACT AERS INHALE 2 PUFFS INTO THE LUNGS DAILY  . traMADol (ULTRAM) 50 MG tablet Take 1 tablet (50 mg total) by mouth every 12 (twelve) hours as needed. for pain  . VIRTUSSIN A/C 100-10 MG/5ML syrup TAKE 10 ML BY MOUTH THREE TIMES DAILY AS NEEDED FOR COUGH  . vitamin B-12 (CYANOCOBALAMIN) 100 MCG tablet Take 100 mcg by mouth daily. Reported on 01/19/2016  . zolpidem (AMBIEN) 5 MG tablet Take 1 tablet (5 mg total) by mouth at bedtime as needed for sleep.  . [DISCONTINUED] benzonatate (TESSALON) 200 MG capsule Take 1 capsule (200 mg total) by mouth 3 (three) times daily as needed for cough. (Patient not taking:  Reported on 12/17/2017)  . [DISCONTINUED] ciprofloxacin (CIPRO) 500 MG tablet Take 1 tablet (500 mg total) by mouth 2 (two) times daily. (Patient not taking: Reported on 12/17/2017)   No facility-administered encounter medications on file as of 12/17/2017.      Review of Systems  Constitutional:   No  weight loss, night sweats,  Fevers, chills,  +fatigue, or  lassitude.  HEENT:   No headaches,  Difficulty swallowing,  Tooth/dental problems, or  Sore throat,                No sneezing, itching, ear ache, nasal congestion, post nasal drip,   CV:  No chest pain,  Orthopnea, PND,  anasarca, dizziness, palpitations, syncope.   GI  No heartburn, indigestion, abdominal  pain, nausea, vomiting, diarrhea, change in bowel habits, loss of appetite, bloody stools.   Resp:No chest wall deformity  Skin: no rash or lesions.  GU: no dysuria, change in color of urine, no urgency or frequency.  No flank pain, no hematuria   MS:  No joint pain or swelling.  No decreased range of motion.  No back pain.    Physical Exam  BP 116/72 (BP Location: Left Arm, Cuff Size: Normal)   Pulse 95   Ht 5\' 1"  (1.549 m)   Wt 175 lb (79.4 kg)   SpO2 94%   BMI 33.07 kg/m   GEN: A/Ox3; pleasant , NAD, elderly , obese on O2 , rolling walker    HEENT:  [place]/AT,  EACs-clear, TMs-wnl, NOSE-clear, THROAT-clear, no lesions, no postnasal drip or exudate noted.   NECK:  Supple w/ fair ROM; no JVD; normal carotid impulses w/o bruits; no thyromegaly or nodules palpated; no lymphadenopathy.    RESP  Clear  P & A; w/o, wheezes/ rales/ or rhonchi. no accessory muscle use, no dullness to percussion  CARD:  RRR, no m/r/g, tr to 1 +peripheral edema, pulses intact, no cyanosis or clubbing.  GI:   Soft & nt; nml bowel sounds; no organomegaly or masses detected.   Musco: Warm bil, no deformities or joint swelling noted.   Neuro: alert, no focal deficits noted.    Skin: Warm, no lesions or rashes    Lab Results:  CBC      Component Value Date/Time   WBC 5.0 12/04/2017 1143   WBC 3.3 (L) 08/29/2017 0854   RBC 2.70 (L) 12/04/2017 1143   HGB 7.4 (L) 12/04/2017 1143   HGB 11.5 (L) 07/04/2017 1122   HCT 24.8 (L) 12/04/2017 1143   HCT 35.7 07/04/2017 1122   PLT 120 (L) 12/04/2017 1143   PLT 298 07/04/2017 1122   MCV 91.9 12/04/2017 1143   MCV 92.2 07/04/2017 1122   MCH 27.4 12/04/2017 1143   MCHC 29.8 (L) 12/04/2017 1143   RDW 24.6 (H) 12/04/2017 1143   RDW 19.8 (H) 07/04/2017 1122   LYMPHSABS 1.2 12/04/2017 1143   LYMPHSABS 1.1 07/04/2017 1122   MONOABS 0.7 12/04/2017 1143   MONOABS 0.6 07/04/2017 1122   EOSABS 0.0 12/04/2017 1143   EOSABS 0.2 07/04/2017 1122   BASOSABS 0.0 12/04/2017 1143   BASOSABS 0.0 07/04/2017 1122    BMET    Component Value Date/Time   NA 138 12/04/2017 1143   NA 137 07/04/2017 1122   K 3.9 12/04/2017 1143   K 4.1 07/04/2017 1122   CL 97 (L) 12/04/2017 1143   CO2 29 12/04/2017 1143   CO2 30 (H) 07/04/2017 1122   GLUCOSE 102 12/04/2017 1143   GLUCOSE 104 07/04/2017 1122   BUN 14 12/04/2017 1143   BUN 15.8 07/04/2017 1122   CREATININE 0.96 12/04/2017 1143   CREATININE 0.9 07/04/2017 1122   CALCIUM 8.4 12/04/2017 1143   CALCIUM 9.4 07/04/2017 1122   GFRNONAA 57 (L) 12/04/2017 1143   GFRAA >60 12/04/2017 1143    BNP    Component Value Date/Time   BNP 32.5 10/18/2016 0336    ProBNP No results found for: PROBNP  Imaging: No results found.   Assessment & Plan:   Chronic diastolic CHF (congestive heart failure) ([facility]) Suspect pt has some decompensated D CHF - lasix is helping  Check BNP  Low salt diet  Cont on lasix , labs today with bmet/bnp and cbc.    Chronic respiratory failure  with hypoxia ([place]) Cont on o2 .  Keep sats >90%.   COPD (chronic obstructive pulmonary disease) ([place]) Does not appear to have exacerbation  Check cxr  Cont on Stioto and [employer], NP 12/17/2017

## 2017-12-18 ENCOUNTER — Telehealth: Payer: Self-pay | Admitting: Adult Health

## 2017-12-18 ENCOUNTER — Other Ambulatory Visit: Payer: Self-pay | Admitting: Adult Health

## 2017-12-18 DIAGNOSIS — J90 Pleural effusion, not elsewhere classified: Secondary | ICD-10-CM

## 2017-12-18 MED ORDER — LEVOFLOXACIN 500 MG PO TABS: 500.0000 mg | ORAL_TABLET | Freq: Every day | ORAL | 0 refills | Status: DC

## 2017-12-18 MED ORDER — TIOTROPIUM BROMIDE-OLODATEROL 2.5-2.5 MCG/ACT IN AERS: 2.0000 | INHALATION_SPRAY | Freq: Every day | RESPIRATORY_TRACT | 0 refills | Status: DC

## 2017-12-18 MED ORDER — FLUTICASONE FUROATE 200 MCG/ACT IN AEPB: 1.0000 | INHALATION_SPRAY | Freq: Every day | RESPIRATORY_TRACT | 0 refills | Status: DC

## 2017-12-18 NOTE — Progress Notes (Signed)
Result Notes for DG Chest 2 View   Notes recorded by Rinaldo Ratel, CMA on 12/18/2017 at 9:16 AM EDT Per TP she tried calling patient x2 - last evening and this morning Orders only encounter created for the Levaquin and Thoracentesis so that this may go ahead and be scheduled Will call patient again regarding results ------  Notes recorded by Melvenia Needles, NP on 12/18/2017 at 9:15 AM EDT New Left Pleural effusion - ? Etiology . Possible underlying PNA -will treat with abx.  Will need Dx/therapeutic Tap w/ cytoloogy and labs. (has metastatic ovarian cancer - previous malignant right effusion in past )  Discussed with Dr. Lamonte Sakai  ,   1. Begin Levaquin 500mg  daily x 7 days , no refills.  2. Set up for Thoracentesis on left - do not w/draw >1.5L .  3. Needs ov in 1 week with repeat Chest xray .  Please contact office for sooner follow up if symptoms do not improve or worsen or seek emergency care    ------  Notes recorded by Melvenia Needles, NP on 12/17/2017 at 5:26 PM EDT Will wait for labs to return, decide on next step with abx vs diuresis ./thoracentesis  Tried to call pt, no answer. Stable at [facility]

## 2017-12-18 NOTE — Telephone Encounter (Signed)
TP spoke with patient, discussed results/recs Thoracentesis and Levaquin already ordered per cxr result note Nothing further needed; will sign off

## 2017-12-18 NOTE — Addendum Note (Signed)
Addended by: Parke Poisson E on: 12/18/2017 05:48 PM   Modules accepted: Orders

## 2017-12-18 NOTE — Progress Notes (Signed)
Per TP she tried calling patient x2 - last evening and this morning Orders only encounter created for the Levaquin and Thoracentesis so that this may go ahead and be scheduled Will call patient again regarding results

## 2017-12-19 ENCOUNTER — Ambulatory Visit (HOSPITAL_COMMUNITY)
Admission: RE | Admit: 2017-12-19 | Discharge: 2017-12-19 | Disposition: A | Discharge status: Home / Self Care | Payer: Medicare Other | Source: Ambulatory Visit | Attending: Radiology | Admitting: Radiology

## 2017-12-19 ENCOUNTER — Ambulatory Visit (HOSPITAL_COMMUNITY)
Admission: RE | Admit: 2017-12-19 | Discharge: 2017-12-19 | Disposition: A | Discharge status: Home / Self Care | Payer: Medicare Other | Source: Ambulatory Visit | Attending: Adult Health | Admitting: Adult Health

## 2017-12-19 ENCOUNTER — Inpatient Hospital Stay: Payer: Medicare Other

## 2017-12-19 ENCOUNTER — Telehealth: Payer: Self-pay | Admitting: Oncology

## 2017-12-19 ENCOUNTER — Inpatient Hospital Stay: Payer: Medicare Other | Attending: Nurse Practitioner | Admitting: Oncology

## 2017-12-19 VITALS — BP 144/72 | HR 96 | Temp 97.7°F | Resp 18 | Ht 61.0 in | Wt 174.6 lb

## 2017-12-19 DIAGNOSIS — D649 Anemia, unspecified: Secondary | ICD-10-CM | POA: Diagnosis not present

## 2017-12-19 DIAGNOSIS — J9811 Atelectasis: Secondary | ICD-10-CM | POA: Diagnosis not present

## 2017-12-19 DIAGNOSIS — D696 Thrombocytopenia, unspecified: Secondary | ICD-10-CM | POA: Insufficient documentation

## 2017-12-19 DIAGNOSIS — I10 Essential (primary) hypertension: Secondary | ICD-10-CM | POA: Diagnosis not present

## 2017-12-19 DIAGNOSIS — Z9221 Personal history of antineoplastic chemotherapy: Secondary | ICD-10-CM | POA: Diagnosis not present

## 2017-12-19 DIAGNOSIS — I700 Atherosclerosis of aorta: Secondary | ICD-10-CM | POA: Diagnosis not present

## 2017-12-19 DIAGNOSIS — Z90722 Acquired absence of ovaries, bilateral: Secondary | ICD-10-CM

## 2017-12-19 DIAGNOSIS — Z90710 Acquired absence of both cervix and uterus: Secondary | ICD-10-CM | POA: Diagnosis not present

## 2017-12-19 DIAGNOSIS — C561 Malignant neoplasm of right ovary: Secondary | ICD-10-CM | POA: Insufficient documentation

## 2017-12-19 DIAGNOSIS — J90 Pleural effusion, not elsewhere classified: Secondary | ICD-10-CM | POA: Diagnosis not present

## 2017-12-19 DIAGNOSIS — J910 Malignant pleural effusion: Secondary | ICD-10-CM | POA: Insufficient documentation

## 2017-12-19 DIAGNOSIS — C786 Secondary malignant neoplasm of retroperitoneum and peritoneum: Secondary | ICD-10-CM

## 2017-12-19 DIAGNOSIS — C482 Malignant neoplasm of peritoneum, unspecified: Secondary | ICD-10-CM | POA: Diagnosis not present

## 2017-12-19 DIAGNOSIS — K5900 Constipation, unspecified: Secondary | ICD-10-CM | POA: Diagnosis not present

## 2017-12-19 DIAGNOSIS — Z87891 Personal history of nicotine dependence: Secondary | ICD-10-CM | POA: Insufficient documentation

## 2017-12-19 DIAGNOSIS — Z79899 Other long term (current) drug therapy: Secondary | ICD-10-CM

## 2017-12-19 DIAGNOSIS — C5700 Malignant neoplasm of unspecified fallopian tube: Secondary | ICD-10-CM

## 2017-12-19 DIAGNOSIS — J449 Chronic obstructive pulmonary disease, unspecified: Secondary | ICD-10-CM | POA: Insufficient documentation

## 2017-12-19 DIAGNOSIS — C384 Malignant neoplasm of pleura: Secondary | ICD-10-CM | POA: Diagnosis not present

## 2017-12-19 LAB — AMYLASE, PLEURAL OR PERITONEAL FLUID: AMYLASE FL: 54 U/L

## 2017-12-19 LAB — ALBUMIN, PLEURAL OR PERITONEAL FLUID: ALBUMIN FL: 2.3 g/dL

## 2017-12-19 LAB — BODY FLUID CELL COUNT WITH DIFFERENTIAL
Lymphs, Fluid: 66 %
Monocyte-Macrophage-Serous Fluid: 33 % — ABNORMAL LOW (ref 50–90)
NEUTROPHIL FLUID: 1 % (ref 0–25)
Total Nucleated Cell Count, Fluid: 2033 cu mm — ABNORMAL HIGH (ref 0–1000)

## 2017-12-19 LAB — GRAM STAIN

## 2017-12-19 LAB — PROTEIN, PLEURAL OR PERITONEAL FLUID: TOTAL PROTEIN, FLUID: 5 g/dL

## 2017-12-19 LAB — GLUCOSE, PLEURAL OR PERITONEAL FLUID: Glucose, Fluid: 117 mg/dL

## 2017-12-19 LAB — LACTATE DEHYDROGENASE, PLEURAL OR PERITONEAL FLUID: LD FL: 149 U/L — AB (ref 3–23)

## 2017-12-19 MED ORDER — LIDOCAINE HCL 1 % IJ SOLN
INTRAMUSCULAR | Status: AC
  Filled 2017-12-19: qty 20

## 2017-12-19 NOTE — Procedures (Signed)
PROCEDURE SUMMARY:  Successful US guided left thoracentesis. Yielded 470 mL of clear yellow fluid. Pt tolerated procedure well. No immediate complications.  Specimen was sent for labs. CXR ordered.  Ascencion Dike PA-C 12/19/2017 12:27 PM

## 2017-12-19 NOTE — Telephone Encounter (Signed)
Scheduled appt per 6/12 los - gave patient AVS and calender per los.  

## 2017-12-19 NOTE — Progress Notes (Signed)
[facility] OFFICE PROGRESS NOTE   Diagnosis: Peritoneal cancer  INTERVAL HISTORY:   Ms. Swaim returns as scheduled.  She was transfused with packed red blood cells on 12/05/2017.  She noted initial improvement in her energy level, but over the past week she has developed increased dyspnea. She was evaluated by pulmonary medicine.  A chest x-ray 12/17/2017 revealed a new left pleural effusion, trace right pleural effusion.  She is scheduled for a thoracentesis today.  Her appetite has diminished.  She is having bowel movements.  Objective:  Vital signs in last 24 hours:  Blood pressure (!) 144/72, pulse 96, temperature 97.7 F (36.5 C), temperature source Oral, resp. rate 18, height _0  (1.549 m), weight 174 lb 9.6 oz (79.2 kg), SpO2 99 %.    Resp: Distant breath sounds, inspiratory rhonchi at the posterior bases bilaterally, no respiratory distress Cardio: Regular rate and rhythm GI: No mass, mild diffuse tenderness,?  Mild ascites Vascular: No leg edema    Portacath/PICC-without erythema  Lab Results:  Lab Results  Component Value Date   WBC 7.5 12/17/2017   HGB 11.2 (L) 12/17/2017   HCT 34.1 (L) 12/17/2017   MCV 90.0 12/17/2017   PLT 387.0 12/17/2017   NEUTROABS 5.1 12/17/2017    CMP  Lab Results  Component Value Date   NA 139 12/17/2017   K 3.9 12/17/2017   CL 93 (L) 12/17/2017   CO2 40 (H) 12/17/2017   GLUCOSE 105 (H) 12/17/2017   BUN 14 12/17/2017   CREATININE 0.83 12/17/2017   CALCIUM 10.2 12/17/2017   PROT 7.6 12/04/2017   ALBUMIN 2.9 (L) 12/04/2017   AST 20 12/04/2017   ALT 12 12/04/2017   ALKPHOS 65 12/04/2017   BILITOT 0.6 12/04/2017   GFRNONAA 57 (L) 12/04/2017   GFRAA >60 12/04/2017    Imaging:  Dg Chest 2 View  Result Date: 12/17/2017 CLINICAL DATA:  Worsening dyspnea per month.  History of COPD. EXAM: CHEST - 2 VIEW COMPARISON:  CT chest October 16, 2017 and chest radiograph October 15, 2017 FINDINGS: New moderate LEFT  pleural effusion with subpulmonic component. Trace RIGHT pleural effusion. Biapical pleural thickening. Scattered small pulmonary nodules better seen on prior CT. Cardiac silhouette predominately obscure. Calcified aortic arch. No pneumothorax. Single-lumen RIGHT chest Port-A-Cath distal tip projects in the superior vena cava. Soft tissue planes and included osseous unchanged. IMPRESSION: New moderate LEFT pleural effusion with underlying consolidation. Trace RIGHT pleural effusion. Scattered small pulmonary nodules better demonstrated on prior CT. These results will be called to the ordering clinician or representative by the Radiologist Assistant, and communication documented in the PACS or zVision Dashboard. Electronically Signed   By: Elon Alas M.D.   On: 12/17/2017 17:06    Medications: I have reviewed the patient's current medications.   Assessment/Plan: 1. 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 Significant improvement in malignant right pleural effusion.  Status post robotic-assisted laparoscopic hysterectomy with  bilateral  salpingoophorectomy, omentectomy, radical tumor debulking 12/29/2014. Per Dr. Serita Grit office note 01/14/2015 cytoreduction was optimal with residual disease remaining only in the 1 mm implants on the small intestine. 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  MSI-stable, mutation burden-4, BRAF rearrangement,ERBB4, KRAS G12D Cycle 1 adjuvant Taxol/carboplatin 01/20/2015 Cycle 2 adjuvant Taxol/carboplatin 02/10/2015 Cycle 3 adjuvant Taxol/carboplatin 03/10/2015 CA 125 on 1013 2016-42  CTs of the chest, abdomen, and pelvis 05/31/2015 with no evidence of progressive ovarian cancer  CTs of the chest, abdomen, and pelvis 08/07/2015 and 08/08/2015-no evidence of progressive ovarian cancer  Peritoneal studding noted at the time of the cholecystectomy procedure 08/09/2015 Elevated CA 125 10/07/2015  CT 10/07/2015 with stricturing at the sigmoid colon, constipation, and omental nodularity  Initiation of salvage weekly Taxol chemotherapy 10/13/2015  Taxol changed to every 2 weeks beginning 01/19/2016 due to painful neuropathy.  CT scans 07/19/2016 with no acute findings. No features in the abdomen or pelvis to suggest recurrent disease.Stable mild fullness right intrarenal collecting system.  Elevated CA 125 treatment resumed with Taxol/Avastin on a 2 week schedule 09/27/2016  CT 02/12/2017-no evidence of carcinomatosis, no evidence of progressive metastatic disease  Taxol/Avastin continued every 2 weeks  CT 09/10/2017-increase in trace ascites, no evidence of progressive carcinomatosis, inflammatory changes at the lung bases with a new 3 mm right lower lobe nodule  Taxol/Avastin continued every 2  weeks  Progressive rise in the CA 125  Cycle 1 carboplatin 3-week schedule4/17/2019  Cycle 2 carboplatin 11/14/2017 2. COPD-followed by Dr. Lamonte Sakai. 3. Dyspnea secondary to COPD and the large right pleural effusion, status post therapeutic thoracentesis procedures 09/30/2014,10/09/2014, and 10/21/2014. Left thoracentesis 10/30/2014  Progressive left pleural effusion noted on chest x-ray 12/17/2017  Left thoracentesis 12/19/2017 4. Left nephrectomy as a child 5. Delayed nausea following cycle 1 Taxol/carboplatin, Aloxi/Emend added with cycle 2 with improvement. 6. Right lower extremity edema, right calf pain 12/09/2014. Negative venous Doppler 12/09/2014. 7. Diffuse pruritus following cycle 1 adjuvant Taxol/carboplatin 01/20/2015, no rash, resolved with a steroid dose pack 8. Thrombocytopenia second to chemotherapy, the carboplatin was dose reduced with cycle 2 adjuvant Taxol/carboplatin 02/10/2015 9. Chemotherapy-induced peripheral neuropathy-painful peripheral neuropathy involving the feet 01/19/2016.  10. Admission with acute cholecystitis 08/07/2015, status post a cholecystectomy 08/09/2015 11. Admission 10/08/2015 with abdominal pain/constipation, improved with bowel rest and laxatives 12. Mild right hydronephrosis noted on the CT 10/07/2015. Renal ultrasound 12/16/2015 with no hydronephrosis noted.No hydronephrosis on CT 07/19/2016. 13. 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  She is currently maintained off of specific therapy.  There is clinical evidence of disease progression and the chest x-ray 12/17/2017 reveals a progressive left pleural effusion. I reviewed the x-ray images and discussed the differential diagnosis with Ms. Desrocher and her husband.  The pleural effusion could be related to cardiac disease or less likely infection.  I think it is  most likely the pleural effusion is malignant.  We will follow-up on the cytology from the thoracentesis.  Ms. Degan will begin a vacation to the beach on 12/23/2017.  She will return for an office visit here on 01/02/2018.  She will contact us in the interim as needed.  We will discuss salvage systemic treatment options  when she returns on 01/02/2018.  25 minutes were spent with the patient today.  The majority of the time was used for counseling and coordination of care.  Betsy Coder, MD  12/19/2017  10:53 AM

## 2017-12-20 LAB — PH, BODY FLUID: pH, Body Fluid: 7.5

## 2017-12-20 LAB — TRIGLYCERIDES, BODY FLUIDS: TRIGLYCERIDES FL: 76 mg/dL

## 2017-12-21 LAB — CHOLESTEROL, BODY FLUID: CHOL FL: 86 mg/dL

## 2017-12-24 LAB — CULTURE, BODY FLUID W GRAM STAIN -BOTTLE: Culture: NO GROWTH

## 2017-12-24 LAB — CULTURE, BODY FLUID-BOTTLE

## 2017-12-25 ENCOUNTER — Encounter: Payer: Self-pay | Admitting: Adult Health

## 2017-12-31 ENCOUNTER — Ambulatory Visit (INDEPENDENT_AMBULATORY_CARE_PROVIDER_SITE_OTHER)
Admission: RE | Admit: 2017-12-31 | Discharge: 2017-12-31 | Disposition: A | Discharge status: Home / Self Care | Payer: Medicare Other | Source: Ambulatory Visit | Attending: Adult Health | Admitting: Adult Health

## 2017-12-31 ENCOUNTER — Encounter: Payer: Self-pay | Admitting: Adult Health

## 2017-12-31 ENCOUNTER — Ambulatory Visit (INDEPENDENT_AMBULATORY_CARE_PROVIDER_SITE_OTHER): Payer: Medicare Other | Admitting: Adult Health

## 2017-12-31 VITALS — BP 138/84 | HR 94 | Ht 61.0 in | Wt 174.2 lb

## 2017-12-31 DIAGNOSIS — R918 Other nonspecific abnormal finding of lung field: Secondary | ICD-10-CM | POA: Diagnosis not present

## 2017-12-31 DIAGNOSIS — I2510 Atherosclerotic heart disease of native coronary artery without angina pectoris: Secondary | ICD-10-CM

## 2017-12-31 DIAGNOSIS — J90 Pleural effusion, not elsewhere classified: Secondary | ICD-10-CM | POA: Diagnosis not present

## 2017-12-31 DIAGNOSIS — J910 Malignant pleural effusion: Secondary | ICD-10-CM

## 2017-12-31 DIAGNOSIS — J449 Chronic obstructive pulmonary disease, unspecified: Secondary | ICD-10-CM | POA: Diagnosis not present

## 2017-12-31 NOTE — Assessment & Plan Note (Signed)
Continue on oxygen 

## 2017-12-31 NOTE — Patient Instructions (Addendum)
Follow up with Dr. Benay Spice this week as planned  CT chest .  Continue on Stiolto and Arnuity .  Continue on Oxygen 2l/m.  Continue on Lasix 40mg  daily .  Follow up 2-3 months Dr. Lamonte Sakai and As needed   Please contact office for sooner follow up if symptoms do not improve or worsen or seek emergency care

## 2017-12-31 NOTE — Assessment & Plan Note (Signed)
Currently stable on Stiolto and Arnuity

## 2017-12-31 NOTE — Assessment & Plan Note (Signed)
New left pleural effusion status post thoracentesis consistent with a malignant effusion related to her ovarian cancer Previous CT chest in April showed some scattered pulmonary nodules will need a CT chest to follow further.  Patient is to follow-up with oncology later this week as planned Chest x-ray today does not show re-accumulation.

## 2017-12-31 NOTE — Progress Notes (Signed)
@Patient  ID: Lynn Ramirez, female    DOB: [birth year], 77 y.o.   MRN: [id number]  Chief Complaint  Patient presents with  . Follow-up    Pleural Effusion     Referring provider: Midge Minium, MD  HPI: 77 year old female former smoker followed for COPD and chronic pleural effusions in the setting of ovarian cancer and chronic hypoxic respiratory failure on 2 L of oxygen  TEST  PFT 2013 showed FEV1 at 49%, ratio 46, FVC 75% DLCO 71% CT chest October 16, 2017 showed several 2 to 4 mm nodular opacities throughout the lungs Echo 10/2017 EF 60%, gr 1 DD   12/31/2017 Follow up : COPD , O2 RF , Pleural Effusion -Malignant - Ovarian cancer on Chemo  Patient returns for a 2-week follow-up.  Patient was seen last visit with progressive shortness of breath and increased leg swelling.  She was treated with Lasix.  Chest x-ray showed a new moderate left pleural effusion and a trace right pleural effusion.  Set up for a thoracentesis. , 470 cc was removed.  Cytology returned positive for metastatic adenocarcinoma.  I have discussed these results with the patient and she has a follow-up with oncology later this week. Since last visit patient is feeling better with less dyspnea.  Recently returned back from vacation, she had a very nice time. Chest x-ray today shows clear right lung.  Left lower lobe atelectasis and a trace pleural effusion on the left.  CT chest April 2019 showed scattered lung nodules largest measuring 4 mm. Remains on Stiolto and Arnuity for COPD , no flare of cough  Remains on Oxygen 2l/m .     Allergies  Allergen Reactions  . Latex Rash    Latex gloves ONLY (pt cannot wear them- no reaction if someone else touches her with latex gloves on)  . Penicillins Other (See Comments)    Has patient had a PCN reaction causing immediate rash, facial/tongue/throat swelling, SOB or lightheadedness with hypotension: Yes  Has patient had a PCN reaction causing severe rash involving  mucus membranes or skin necrosis:No Has patient had a PCN reaction that required hospitalization:No Has patient had a PCN reaction occurring within the last 10 years:No If all of the above answers are "NO", then may proceed with Cephalosporin use. welps    Immunization History  Administered Date(s) Administered  . Influenza Split 03/26/2014  . Influenza Whole 04/09/2012  . Influenza, High Dose Seasonal PF 03/10/2013, 04/25/2017  . Influenza,inj,Quad PF,6+ Mos 05/06/2015, 04/12/2016  . Pneumococcal Conjugate-13 05/06/2015  . Pneumococcal Polysaccharide-23 05/17/2016  . Pneumococcal-Unspecified 07/10/2016    Past Medical History:  Diagnosis Date  . CKD (chronic kidney disease), stage II   . COPD (chronic obstructive pulmonary disease) ([place])   . Coronary artery calcification seen on CT scan   . Difficulty sleeping   . Eczema    hands  . Emphysema   . GERD (gastroesophageal reflux disease)   . H/O hydronephrosis   . History of transfusion    age 59  . Hyperlipidemia   . Hypertension   . Ovarian cancer ([place]) dx'd 09/2014   metastatic - prior malignant R pleural effusion and peritoneal carcinomatosis  . S/p nephrectomy     Tobacco History: Social History   Tobacco Use  Smoking Status Former Smoker  . Packs/day: 1.00  . Years: 40.00  . Pack years: 40.00  . Types: Cigarettes  . Last attempt to quit: 07/10/2013  . Years since quitting: 4.4  Smokeless Tobacco Never  Used   Counseling given: Not Answered   Outpatient Encounter Medications as of 12/31/2017  Medication Sig  . Acetaminophen (TYLENOL) 325 MG CAPS Take 1 tablet by mouth 3 (three) times daily as needed (pain).   Marland Kitchen albuterol (PROVENTIL) (2.5 MG/3ML) 0.083% nebulizer solution Take 3 mLs (2.5 mg total) by nebulization every 6 (six) hours as needed for wheezing or shortness of breath.  Marland Kitchen antiseptic oral rinse (BIOTENE) LIQD 15 mLs by Mouth Rinse route 3 (three) times daily.  . ARNUITY ELLIPTA 200 MCG/ACT AEPB INHALE 1  PUFF INTO THE LUNGS DAILY  . atorvastatin (LIPITOR) 10 MG tablet TAKE 1 TABLET(10 MG) BY MOUTH DAILY  . Biotin 2500 MCG CAPS Take 1 tablet by mouth daily.  . carvedilol (COREG) 3.125 MG tablet TAKE 1 TABLET(3.125 MG) BY MOUTH TWICE DAILY WITH A MEAL  . cholecalciferol (VITAMIN D) 1000 units tablet Take 1,000 Units by mouth daily.  . diphenoxylate-atropine (LOMOTIL) 2.5-0.025 MG tablet TAKE 2 TABLETS BY MOUTH FOUR TIMES DAILY AS NEEDED FOR DIARRHEA/ LOOSE STOOLS  . esomeprazole (NEXIUM) 40 MG capsule Take 1 capsule (40 mg total) by mouth daily.  . fluocinonide cream (LIDEX) 2.83 % Apply 1 application topically 2 (two) times daily as needed (For ezcema).   . furosemide (LASIX) 40 MG tablet Take 1 tablet (40 mg total) by mouth daily.  Marland Kitchen gabapentin (NEURONTIN) 300 MG capsule TAKE 1 CAPSULE BY MOUTH EVERY NIGHT AT BEDTIME  . lidocaine-prilocaine (EMLA) cream Apply to port site one hour prior to use. Do not rub in. Cover with plastic.  Marland Kitchen loratadine (CLARITIN) 10 MG tablet Take 10 mg by mouth daily.  . ondansetron (ZOFRAN-ODT) 4 MG disintegrating tablet Take 1 tablet (4 mg total) by mouth every 8 (eight) hours as needed for nausea or vomiting.  Marland Kitchen oxyCODONE-acetaminophen (PERCOCET/ROXICET) 5-325 MG tablet Take 1 tablet by mouth every 6 (six) hours as needed for severe pain.  Marland Kitchen PROAIR HFA 108 (90 Base) MCG/ACT inhaler INHALE 2 PUFFS INTO THE LUNGS FOUR TIMES DAILY AS NEEDED FOR WHEEZING  . pyridoxine (B-6) 100 MG tablet Take 100 mg by mouth daily.  . sorbitol 70 % solution Take 30 cc twice daily until bowel movement then once daily  . STIOLTO RESPIMAT 2.5-2.5 MCG/ACT AERS INHALE 2 PUFFS INTO THE LUNGS DAILY  . traMADol (ULTRAM) 50 MG tablet Take 1 tablet (50 mg total) by mouth every 12 (twelve) hours as needed. for pain  . VIRTUSSIN A/C 100-10 MG/5ML syrup TAKE 10 ML BY MOUTH THREE TIMES DAILY AS NEEDED FOR COUGH  . vitamin B-12 (CYANOCOBALAMIN) 100 MCG tablet Take 100 mcg by mouth daily. Reported on  01/19/2016  . zolpidem (AMBIEN) 5 MG tablet Take 1 tablet (5 mg total) by mouth at bedtime as needed for sleep.  Marland Kitchen levofloxacin (LEVAQUIN) 500 MG tablet Take 1 tablet (500 mg total) by mouth daily. (Patient not taking: Reported on 12/31/2017)  . [DISCONTINUED] Fluticasone Furoate (ARNUITY ELLIPTA) 200 MCG/ACT AEPB Inhale 1 puff into the lungs daily.  . [DISCONTINUED] Tiotropium Bromide-Olodaterol (STIOLTO RESPIMAT) 2.5-2.5 MCG/ACT AERS Inhale 2 puffs into the lungs daily. (Patient not taking: Reported on 12/31/2017)   No facility-administered encounter medications on file as of 12/31/2017.      Review of Systems  Constitutional:   No  weight loss, night sweats,  Fevers, chills, + fatigue, or  lassitude.  HEENT:   No headaches,  Difficulty swallowing,  Tooth/dental problems, or  Sore throat,  No sneezing, itching, ear ache, nasal congestion, post nasal drip,   CV:  No chest pain,  Orthopnea, PND,, anasarca, dizziness, palpitations, syncope.   GI  No heartburn, indigestion, abdominal pain, nausea, vomiting, diarrhea, change in bowel habits, loss of appetite, bloody stools.   Resp:    No chest wall deformity  Skin: no rash or lesions.  GU: no dysuria, change in color of urine, no urgency or frequency.  No flank pain, no hematuria   MS:  No joint pain or swelling.  No decreased range of motion.  No back pain.    Physical Exam  BP 138/84 (BP Location: Left Arm, Cuff Size: Normal)   Pulse 94   Ht 5\' 1"  (1.549 m)   Wt 174 lb 3.2 oz (79 kg)   SpO2 93%   BMI 32.91 kg/m   GEN: A/Ox3; pleasant , NAD, elderly on O2    HEENT:  [place]/AT,  EACs-clear, TMs-wnl, NOSE-clear, THROAT-clear, no lesions, no postnasal drip or exudate noted.   NECK:  Supple w/ fair ROM; no JVD; normal carotid impulses w/o bruits; no thyromegaly or nodules palpated; no lymphadenopathy.    RESP  Decreased BS in bases   no accessory muscle use, no dullness to percussion  CARD:  RRR, no m/r/g, 1+  peripheral edema, pulses intact, no cyanosis or clubbing.  GI:   Soft & nt; nml bowel sounds; no organomegaly or masses detected.   Musco: Warm bil, no deformities or joint swelling noted.   Neuro: alert, no focal deficits noted.    Skin: Warm, no lesions or rashes    Lab Results:  CBC    Component Value Date/Time   WBC 7.5 12/17/2017 1643   RBC 3.79 (L) 12/17/2017 1643   HGB 11.2 (L) 12/17/2017 1643   HGB 7.4 (L) 12/04/2017 1143   HGB 11.5 (L) 07/04/2017 1122   HCT 34.1 (L) 12/17/2017 1643   HCT 35.7 07/04/2017 1122   PLT 387.0 12/17/2017 1643   PLT 120 (L) 12/04/2017 1143   PLT 298 07/04/2017 1122   MCV 90.0 12/17/2017 1643   MCV 92.2 07/04/2017 1122   MCH 27.4 12/04/2017 1143   MCHC 32.8 12/17/2017 1643   RDW 21.2 (H) 12/17/2017 1643   RDW 19.8 (H) 07/04/2017 1122   LYMPHSABS 1.4 12/17/2017 1643   LYMPHSABS 1.1 07/04/2017 1122   MONOABS 0.8 12/17/2017 1643   MONOABS 0.6 07/04/2017 1122   EOSABS 0.1 12/17/2017 1643   EOSABS 0.2 07/04/2017 1122   BASOSABS 0.0 12/17/2017 1643   BASOSABS 0.0 07/04/2017 1122    BMET    Component Value Date/Time   NA 139 12/17/2017 1623   NA 137 07/04/2017 1122   K 3.9 12/17/2017 1623   K 4.1 07/04/2017 1122   CL 93 (L) 12/17/2017 1623   CO2 40 (H) 12/17/2017 1623   CO2 30 (H) 07/04/2017 1122   GLUCOSE 105 (H) 12/17/2017 1623   GLUCOSE 104 07/04/2017 1122   BUN 14 12/17/2017 1623   BUN 15.8 07/04/2017 1122   CREATININE 0.83 12/17/2017 1623   CREATININE 0.96 12/04/2017 1143   CREATININE 0.9 07/04/2017 1122   CALCIUM 10.2 12/17/2017 1623   CALCIUM 9.4 07/04/2017 1122   GFRNONAA 57 (L) 12/04/2017 1143   GFRAA >60 12/04/2017 1143    BNP    Component Value Date/Time   BNP 32.5 10/18/2016 0336    ProBNP    Component Value Date/Time   PROBNP 24.0 12/17/2017 1643    Imaging: Dg Chest 1 View  Result Date: 12/19/2017 CLINICAL DATA:  Post LEFT thoracentesis EXAM: CHEST  1 VIEW COMPARISON:  12/17/2017 FINDINGS: RIGHT  jugular Port-A-Cath with tip projecting over SVC. Upper normal heart size. Mediastinal contours and pulmonary vascularity normal. Atherosclerotic calcification aorta. Decreased LEFT pleural effusion and basilar atelectasis post thoracentesis. No pneumothorax. Mild RIGHT basilar atelectasis also seen. Upper lungs clear. Bones demineralized. IMPRESSION: Decreased LEFT pleural effusion post thoracentesis without pneumothorax. Mild bibasilar atelectasis. Electronically Signed   By: Lavonia Dana M.D.   On: 12/19/2017 12:43   Dg Chest 2 View  Result Date: 12/31/2017 CLINICAL DATA:  Follow-up thoracentesis and pleural effusion. History of COPD, emphysema, hypertension, ex-smoker. EXAM: CHEST - 2 VIEW COMPARISON:  Chest x-rays dated 12/19/2017 12/17/2017. Ultrasound-guided thoracentesis dated 12/19/2017. FINDINGS: Heart size and mediastinal contours are stable. Atherosclerotic changes noted at the aortic arch. RIGHT chest wall Port-A-Cath is stable in position with tip at the level of the mid SVC. RIGHT lung is clear. Stable elevation of the LEFT hemidiaphragm, with presumed overlying atelectasis. Trace residual pleural effusion at the LEFT posterior costophrenic angle, decreased compared to earlier chest x-ray of 12/17/2017. No pneumothorax seen. Stable kyphosis of the thoracic spine. No acute or suspicious osseous finding. IMPRESSION: 1. Trace residual LEFT pleural effusion, decreased compared to earlier chest x-ray of 12/17/2017 secondary to interval thoracentesis. No pneumothorax seen. 2. RIGHT lung is clear. 3. Aortic atherosclerosis. Electronically Signed   By: Franki Cabot M.D.   On: 12/31/2017 14:29   Dg Chest 2 View  Result Date: 12/17/2017 CLINICAL DATA:  Worsening dyspnea per month.  History of COPD. EXAM: CHEST - 2 VIEW COMPARISON:  CT chest October 16, 2017 and chest radiograph October 15, 2017 FINDINGS: New moderate LEFT pleural effusion with subpulmonic component. Trace RIGHT pleural effusion. Biapical  pleural thickening. Scattered small pulmonary nodules better seen on prior CT. Cardiac silhouette predominately obscure. Calcified aortic arch. No pneumothorax. Single-lumen RIGHT chest Port-A-Cath distal tip projects in the superior vena cava. Soft tissue planes and included osseous unchanged. IMPRESSION: New moderate LEFT pleural effusion with underlying consolidation. Trace RIGHT pleural effusion. Scattered small pulmonary nodules better demonstrated on prior CT. These results will be called to the ordering clinician or representative by the Radiologist Assistant, and communication documented in the PACS or zVision Dashboard. Electronically Signed   By: Elon Alas M.D.   On: 12/17/2017 17:06   US Thoracentesis Asp Pleural Space W/img Guide  Result Date: 12/19/2017 INDICATION: COPD. Shortness of breath. Left-sided pleural effusion. Request diagnostic and therapeutic thoracentesis. EXAM: ULTRASOUND GUIDED LEFT THORACENTESIS MEDICATIONS: None. COMPLICATIONS: None immediate. PROCEDURE: An ultrasound guided thoracentesis was thoroughly discussed with the patient and questions answered. The benefits, risks, alternatives and complications were also discussed. The patient understands and wishes to proceed with the procedure. Written consent was obtained. Ultrasound was performed to localize and mark an adequate pocket of fluid in the left chest. The area was then prepped and draped in the normal sterile fashion. 1% Lidocaine was used for local anesthesia. Under ultrasound guidance a 19 gauge, 7-cm, Yueh catheter was introduced. Thoracentesis was performed. The catheter was removed and a dressing applied. FINDINGS: A total of approximately 470 mL of clear yellow fluid was removed. Samples were sent to the laboratory as requested by the clinical team. IMPRESSION: Successful ultrasound guided left thoracentesis yielding 470 mL of pleural fluid. Read by: Ascencion Dike PA-C Electronically Signed   By: Jacqulynn Cadet M.D.   On: 12/19/2017 13:46     Assessment & Plan:  Malignant pleural effusion New left pleural effusion status post thoracentesis consistent with a malignant effusion related to her ovarian cancer Previous CT chest in April showed some scattered pulmonary nodules will need a CT chest to follow further.  Patient is to follow-up with oncology later this week as planned Chest x-ray today does not show re-accumulation.   COPD  GOLD C Currently stable on Stiolto and Arnuity  Chronic respiratory failure with hypoxia ([place]) Continue on oxygen     Rexene Edison, NP 12/31/2017

## 2018-01-02 ENCOUNTER — Inpatient Hospital Stay (HOSPITAL_BASED_OUTPATIENT_CLINIC_OR_DEPARTMENT_OTHER): Payer: Medicare Other | Admitting: Oncology

## 2018-01-02 ENCOUNTER — Inpatient Hospital Stay: Payer: Medicare Other

## 2018-01-02 ENCOUNTER — Encounter: Payer: Self-pay | Admitting: Oncology

## 2018-01-02 ENCOUNTER — Telehealth: Payer: Self-pay | Admitting: Oncology

## 2018-01-02 VITALS — BP 155/81 | HR 84 | Temp 98.0°F | Resp 18 | Ht 61.0 in | Wt 176.1 lb

## 2018-01-02 DIAGNOSIS — C5700 Malignant neoplasm of unspecified fallopian tube: Secondary | ICD-10-CM

## 2018-01-02 DIAGNOSIS — Z90710 Acquired absence of both cervix and uterus: Secondary | ICD-10-CM

## 2018-01-02 DIAGNOSIS — Z9221 Personal history of antineoplastic chemotherapy: Secondary | ICD-10-CM

## 2018-01-02 DIAGNOSIS — C569 Malignant neoplasm of unspecified ovary: Secondary | ICD-10-CM

## 2018-01-02 DIAGNOSIS — C482 Malignant neoplasm of peritoneum, unspecified: Secondary | ICD-10-CM | POA: Diagnosis not present

## 2018-01-02 DIAGNOSIS — C561 Malignant neoplasm of right ovary: Secondary | ICD-10-CM | POA: Diagnosis not present

## 2018-01-02 DIAGNOSIS — C786 Secondary malignant neoplasm of retroperitoneum and peritoneum: Secondary | ICD-10-CM

## 2018-01-02 DIAGNOSIS — Z79899 Other long term (current) drug therapy: Secondary | ICD-10-CM

## 2018-01-02 DIAGNOSIS — Z90722 Acquired absence of ovaries, bilateral: Secondary | ICD-10-CM | POA: Diagnosis not present

## 2018-01-02 DIAGNOSIS — D696 Thrombocytopenia, unspecified: Secondary | ICD-10-CM

## 2018-01-02 DIAGNOSIS — C799 Secondary malignant neoplasm of unspecified site: Secondary | ICD-10-CM

## 2018-01-02 DIAGNOSIS — D649 Anemia, unspecified: Secondary | ICD-10-CM

## 2018-01-02 DIAGNOSIS — J910 Malignant pleural effusion: Secondary | ICD-10-CM | POA: Diagnosis not present

## 2018-01-02 DIAGNOSIS — J449 Chronic obstructive pulmonary disease, unspecified: Secondary | ICD-10-CM

## 2018-01-02 LAB — CMP (CANCER CENTER ONLY)
ALK PHOS: 75 U/L (ref 38–126)
ALT: 9 U/L (ref 0–44)
AST: 21 U/L (ref 15–41)
Albumin: 3.2 g/dL — ABNORMAL LOW (ref 3.5–5.0)
Anion gap: 10 (ref 5–15)
BUN: 17 mg/dL (ref 8–23)
CO2: 32 mmol/L (ref 22–32)
CREATININE: 0.81 mg/dL (ref 0.44–1.00)
Calcium: 10.1 mg/dL (ref 8.9–10.3)
Chloride: 97 mmol/L — ABNORMAL LOW (ref 98–111)
GFR, Estimated: >60 mL/min (ref >60)
GLUCOSE: 115 mg/dL — AB (ref 70–99)
Potassium: 3.9 mmol/L (ref 3.5–5.1)
SODIUM: 139 mmol/L (ref 135–145)
Total Bilirubin: 0.4 mg/dL (ref 0.3–1.2)
Total Protein: 8 g/dL (ref 6.5–8.1)

## 2018-01-02 LAB — CBC WITH DIFFERENTIAL (CANCER CENTER ONLY)
Basophils Absolute: 0.1 10*3/uL (ref 0.0–0.1)
Basophils Relative: 1 %
EOS ABS: 0.4 10*3/uL (ref 0.0–0.5)
EOS PCT: 6 %
HCT: 32.6 % — ABNORMAL LOW (ref 34.8–46.6)
Hemoglobin: 10.7 g/dL — ABNORMAL LOW (ref 11.6–15.9)
LYMPHS ABS: 1.1 10*3/uL (ref 0.9–3.3)
Lymphocytes Relative: 17 %
MCH: 30.3 pg (ref 25.1–34.0)
MCHC: 32.8 g/dL (ref 31.5–36.0)
MCV: 92.4 fL (ref 79.5–101.0)
MONO ABS: 0.5 10*3/uL (ref 0.1–0.9)
MONOS PCT: 8 %
NEUTROS PCT: 68 %
Neutro Abs: 4.6 10*3/uL (ref 1.5–6.5)
Platelet Count: 254 10*3/uL (ref 145–400)
RBC: 3.52 MIL/uL — ABNORMAL LOW (ref 3.70–5.45)
RDW: 21.4 % — AB (ref 11.2–14.5)
WBC Count: 6.7 10*3/uL (ref 3.9–10.3)

## 2018-01-02 NOTE — Telephone Encounter (Signed)
Appointment scheduled for 7/10.  Added appointment to Infusion log for 7/3/ patient declined AVS/ per 6/26 los

## 2018-01-02 NOTE — Patient Instructions (Signed)
Implanted Port Home Guide An implanted port is a type of central line that is placed under the skin. Central lines are used to provide IV access when treatment or nutrition needs to be given through a person's veins. Implanted ports are used for long-term IV access. An implanted port may be placed because:  You need IV medicine that would be irritating to the small veins in your hands or arms.  You need long-term IV medicines, such as antibiotics.  You need IV nutrition for a long period.  You need frequent blood draws for lab tests.  You need dialysis.  Implanted ports are usually placed in the chest area, but they can also be placed in the upper arm, the abdomen, or the leg. An implanted port has two main parts:  Reservoir. The reservoir is round and will appear as a small, raised area under your skin. The reservoir is the part where a needle is inserted to give medicines or draw blood.  Catheter. The catheter is a thin, flexible tube that extends from the reservoir. The catheter is placed into a large vein. Medicine that is inserted into the reservoir goes into the catheter and then into the vein.  How will I care for my incision site? Do not get the incision site wet. Bathe or shower as directed by your health care provider. How is my port accessed? Special steps must be taken to access the port:  Before the port is accessed, a numbing cream can be placed on the skin. This helps numb the skin over the port site.  Your health care provider uses a sterile technique to access the port. ? Your health care provider must put on a mask and sterile gloves. ? The skin over your port is cleaned carefully with an antiseptic and allowed to dry. ? The port is gently pinched between sterile gloves, and a needle is inserted into the port.  Only "non-coring" port needles should be used to access the port. Once the port is accessed, a blood return should be checked. This helps ensure that the port  is in the vein and is not clogged.  If your port needs to remain accessed for a constant infusion, a clear (transparent) bandage will be placed over the needle site. The bandage and needle will need to be changed every week, or as directed by your health care provider.  Keep the bandage covering the needle clean and dry. Do not get it wet. Follow your health care provider's instructions on how to take a shower or bath while the port is accessed.  If your port does not need to stay accessed, no bandage is needed over the port.  What is flushing? Flushing helps keep the port from getting clogged. Follow your health care provider's instructions on how and when to flush the port. Ports are usually flushed with saline solution or a medicine called heparin. The need for flushing will depend on how the port is used.  If the port is used for intermittent medicines or blood draws, the port will need to be flushed: ? After medicines have been given. ? After blood has been drawn. ? As part of routine maintenance.  If a constant infusion is running, the port may not need to be flushed.  How long will my port stay implanted? The port can stay in for as long as your health care provider thinks it is needed. When it is time for the port to come out, surgery will be   done to remove it. The procedure is similar to the one performed when the port was put in. When should I seek immediate medical care? When you have an implanted port, you should seek immediate medical care if:  You notice a bad smell coming from the incision site.  You have swelling, redness, or drainage at the incision site.  You have more swelling or pain at the port site or the surrounding area.  You have a fever that is not controlled with medicine.  This information is not intended to replace advice given to you by your health care provider. Make sure you discuss any questions you have with your health care provider. Document  Released: 06/26/2005 Document Revised: 12/02/2015 Document Reviewed: 03/03/2013 Elsevier Interactive Patient Education  2017 Elsevier Inc.  

## 2018-01-02 NOTE — Progress Notes (Signed)
DISCONTINUE ON PATHWAY REGIMEN - Ovarian     A cycle is every 21 days:     Carboplatin   **Always confirm dose/schedule in your pharmacy ordering system**  REASON: Disease Progression PRIOR TREATMENT: [id number]: Carboplatin AUC=5 q21 Days; Re-evaluate Every 3 Cycles, Treat until Complete Response, Unacceptable Toxicity, or Disease Progression TREATMENT RESPONSE: Progressive Disease (PD)  START ON PATHWAY REGIMEN - Ovarian     A cycle is every 21 days:     Gemcitabine   **Always confirm dose/schedule in your pharmacy ordering system**  Patient Characteristics: Recurrent or Progressive Disease, Fourth Line and Beyond, BRCA Mutation Absent/Unknown Therapeutic Status: Recurrent or Progressive Disease AJCC T Category: Staged < 8th Ed. AJCC N Category: Staged < 8th Ed. AJCC M Category: Staged < 8th Ed. AJCC 8 Stage Grouping: Staged < 8th Ed. BRCA Mutation Status: Absent Line of Therapy: Fourth Line and Beyond  Intent of Therapy: Non-Curative / Palliative Intent, Discussed with Patient

## 2018-01-02 NOTE — Progress Notes (Signed)
[facility] OFFICE PROGRESS NOTE   Diagnosis: Fallopian tube carcinoma  INTERVAL HISTORY:    Lynn Ramirez returns as scheduled.  She took a vacation to the beach.  The pleural fluid cytology from 12/19/2017 returned positive for metastatic adenocarcinoma.  She reports improvement in dyspnea after theThoracentesis.  A chest x-ray 12/31/2017 revealed a trace residual left pleural effusion.   Objective:  Vital signs in last 24 hours:  Blood pressure (!) 155/81, pulse 84, temperature 98 F (36.7 C), temperature source Oral, resp. rate 18, height '5\' 1"'  (1.549 m), weight 176 lb 1.6 oz (79.9 kg), SpO2 95 %.    Resp: Lungs clear bilaterally Cardio: Regular rate and rhythm GI: No apparent ascites, nontender Vascular: No leg edema   Portacath/PICC-without erythema  Lab Results:  Lab Results  Component Value Date   WBC 6.7 01/02/2018   HGB 10.7 (L) 01/02/2018   HCT 32.6 (L) 01/02/2018   MCV 92.4 01/02/2018   PLT 254 01/02/2018   NEUTROABS 4.6 01/02/2018    CMP  Lab Results  Component Value Date   NA 139 12/17/2017   K 3.9 12/17/2017   CL 93 (L) 12/17/2017   CO2 40 (H) 12/17/2017   GLUCOSE 105 (H) 12/17/2017   BUN 14 12/17/2017   CREATININE 0.83 12/17/2017   CALCIUM 10.2 12/17/2017   PROT 7.6 12/04/2017   ALBUMIN 2.9 (L) 12/04/2017   AST 20 12/04/2017   ALT 12 12/04/2017   ALKPHOS 65 12/04/2017   BILITOT 0.6 12/04/2017   GFRNONAA 57 (L) 12/04/2017   GFRAA >60 12/04/2017    No results found for: CEA1  Lab Results  Component Value Date   INR 0.97 11/02/2014    Imaging:  Dg Chest 2 View  Result Date: 12/31/2017 CLINICAL DATA:  Follow-up thoracentesis and pleural effusion. History of COPD, emphysema, hypertension, ex-smoker. EXAM: CHEST - 2 VIEW COMPARISON:  Chest x-rays dated 12/19/2017 12/17/2017. Ultrasound-guided thoracentesis dated 12/19/2017. FINDINGS: Heart size and mediastinal contours are stable. Atherosclerotic changes noted at the aortic  arch. RIGHT chest wall Port-A-Cath is stable in position with tip at the level of the mid SVC. RIGHT lung is clear. Stable elevation of the LEFT hemidiaphragm, with presumed overlying atelectasis. Trace residual pleural effusion at the LEFT posterior costophrenic angle, decreased compared to earlier chest x-ray of 12/17/2017. No pneumothorax seen. Stable kyphosis of the thoracic spine. No acute or suspicious osseous finding. IMPRESSION: 1. Trace residual LEFT pleural effusion, decreased compared to earlier chest x-ray of 12/17/2017 secondary to interval thoracentesis. No pneumothorax seen. 2. RIGHT lung is clear. 3. Aortic atherosclerosis. Electronically Signed   By: Franki Cabot M.D.   On: 12/31/2017 14:29    Medications: I have reviewed the patient's current medications.   Assessment/Plan:  1. 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 Significant improvement in malignant right pleural effusion.  Status post robotic-assisted laparoscopic hysterectomy with bilateral salpingoophorectomy, omentectomy, radical tumor debulking 12/29/2014.  Per Dr. Serita Grit office  note 01/14/2015 cytoreduction was optimal with residual disease remaining only in the 1 mm implants on the small intestine. 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  MSI-stable, mutation burden-4, BRAF rearrangement,ERBB4, KRAS G12D Cycle 1 adjuvant Taxol/carboplatin 01/20/2015 Cycle 2 adjuvant Taxol/carboplatin 02/10/2015 Cycle 3 adjuvant Taxol/carboplatin 03/10/2015 CA 125 on 1013 2016-42  CTs of the chest, abdomen, and pelvis 05/31/2015 with no evidence of progressive ovarian cancer  CTs of the chest, abdomen, and pelvis 08/07/2015 and 08/08/2015-no evidence of progressive ovarian cancer  Peritoneal studding noted at the time of the cholecystectomy procedure 08/09/2015 Elevated CA 125 10/07/2015  CT 10/07/2015 with stricturing at the sigmoid colon, constipation, and omental nodularity  Initiation of salvage weekly Taxol chemotherapy 10/13/2015  Taxol changed to every 2 weeks beginning 01/19/2016 due to painful neuropathy.  CT scans 07/19/2016 with no acute findings. No features in the abdomen or pelvis to suggest recurrent disease.Stable mild fullness right intrarenal collecting system.  Elevated CA 125 treatment resumed with Taxol/Avastin on a 2 week schedule 09/27/2016  CT 02/12/2017-no evidence of carcinomatosis, no evidence of progressive metastatic disease  Taxol/Avastin continued every 2 weeks  CT 09/10/2017-increase in trace ascites, no evidence of progressive carcinomatosis, inflammatory changes at the lung bases with a new 3 mm right lower lobe nodule  Taxol/Avastin continued every 2 weeks  Progressive rise in the CA 125  Cycle 1 carboplatin 3-week  schedule4/17/2019  Cycle 2 carboplatin 11/14/2017  Progressive malignant left pleural effusion June 2019 2. COPD-followed by Dr. Lamonte Sakai. 3. Dyspnea secondary to COPD and the large right pleural effusion, status post therapeutic thoracentesis procedures 09/30/2014,10/09/2014, and 10/21/2014. Left thoracentesis 10/30/2014  Progressive left pleural effusion noted on chest x-ray 12/17/2017  Left thoracentesis 12/19/2017, cytology positive for metastatic adenocarcinoma 4. Left nephrectomy as a child 5. Delayed nausea following cycle 1 Taxol/carboplatin, Aloxi/Emend added with cycle 2 with improvement. 6. Right lower extremity edema, right calf pain 12/09/2014. Negative venous Doppler 12/09/2014. 7. Diffuse pruritus following cycle 1 adjuvant Taxol/carboplatin 01/20/2015, no rash, resolved with a steroid dose pack 8. Thrombocytopenia second to chemotherapy, the carboplatin was dose reduced with cycle 2 adjuvant Taxol/carboplatin 02/10/2015 9. Chemotherapy-induced peripheral neuropathy-painful peripheral neuropathy involving the feet 01/19/2016.  10. Admission with acute cholecystitis 08/07/2015, status post a cholecystectomy 08/09/2015 11. Admission 10/08/2015 with abdominal pain/constipation, improved with bowel rest and laxatives 12. Mild right hydronephrosis noted on the CT 10/07/2015. Renal ultrasound 12/16/2015 with no hydronephrosis noted.No hydronephrosis on CT 07/19/2016. 13. 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  I discussed treatment options with Lynn Ramirez and Lynn Ramirez.  No therapy will be curative.  She understands a small chance of a clinical response with further systemic therapy.  She would like to proceed with a trial of salvage systemic chemotherapy.  We discussed Doxil, gemcitabine, docetaxel, and other single agent systemic therapy agents.  I  recommend treatment with gemcitabine.  We reviewed potential toxicities associated with gemcitabine including the chance for hematologic toxicity, alopecia, pneumonitis, and rash.  She agrees to proceed.  She will be scheduled for a first treatment with gemcitabine on 01/09/2018.  Gemcitabine will be dose on a day 1/day 8 schedule with a 21-day cycle.  25 minutes were spent with  the patient today.  The majority of the time was used for counseling and coordination of care.  Betsy Coder, MD  01/02/2018  11:04 AM

## 2018-01-03 ENCOUNTER — Telehealth: Payer: Self-pay | Admitting: Oncology

## 2018-01-03 ENCOUNTER — Other Ambulatory Visit: Payer: Self-pay | Admitting: *Deleted

## 2018-01-03 ENCOUNTER — Other Ambulatory Visit: Payer: Self-pay | Admitting: Nurse Practitioner

## 2018-01-03 DIAGNOSIS — C569 Malignant neoplasm of unspecified ovary: Secondary | ICD-10-CM

## 2018-01-03 DIAGNOSIS — C482 Malignant neoplasm of peritoneum, unspecified: Secondary | ICD-10-CM

## 2018-01-03 LAB — CA 125: Cancer Antigen (CA) 125: 164.8 U/mL — ABNORMAL HIGH (ref 0.0–38.1)

## 2018-01-03 MED ORDER — HYDROXYZINE PAMOATE 25 MG PO CAPS: 25.0000 mg | ORAL_CAPSULE | Freq: Four times a day (QID) | ORAL | 0 refills | Status: DC | PRN

## 2018-01-03 NOTE — Telephone Encounter (Signed)
Patient notified regarding appointments being added per 6/26 los

## 2018-01-04 ENCOUNTER — Ambulatory Visit (HOSPITAL_COMMUNITY)
Admission: RE | Admit: 2018-01-04 | Discharge: 2018-01-04 | Disposition: A | Discharge status: Home / Self Care | Payer: Medicare Other | Source: Ambulatory Visit | Attending: Adult Health | Admitting: Adult Health

## 2018-01-04 DIAGNOSIS — Z905 Acquired absence of kidney: Secondary | ICD-10-CM | POA: Diagnosis not present

## 2018-01-04 DIAGNOSIS — I700 Atherosclerosis of aorta: Secondary | ICD-10-CM | POA: Insufficient documentation

## 2018-01-04 DIAGNOSIS — J90 Pleural effusion, not elsewhere classified: Secondary | ICD-10-CM | POA: Insufficient documentation

## 2018-01-04 DIAGNOSIS — R188 Other ascites: Secondary | ICD-10-CM | POA: Insufficient documentation

## 2018-01-04 DIAGNOSIS — C482 Malignant neoplasm of peritoneum, unspecified: Secondary | ICD-10-CM | POA: Insufficient documentation

## 2018-01-04 DIAGNOSIS — R918 Other nonspecific abnormal finding of lung field: Secondary | ICD-10-CM | POA: Diagnosis not present

## 2018-01-04 DIAGNOSIS — J9811 Atelectasis: Secondary | ICD-10-CM | POA: Insufficient documentation

## 2018-01-04 DIAGNOSIS — E279 Disorder of adrenal gland, unspecified: Secondary | ICD-10-CM | POA: Diagnosis not present

## 2018-01-04 MED ORDER — IOPAMIDOL (ISOVUE-300) INJECTION 61%
INTRAVENOUS | Status: AC
  Filled 2018-01-04: qty 30

## 2018-01-04 MED ORDER — IOPAMIDOL (ISOVUE-300) INJECTION 61%
100.0000 mL | Freq: Once | INTRAVENOUS | Status: AC | PRN
  Administered 2018-01-04: 100 mL via INTRAVENOUS

## 2018-01-04 MED ORDER — IOPAMIDOL (ISOVUE-300) INJECTION 61%
INTRAVENOUS | Status: AC
  Filled 2018-01-04: qty 100

## 2018-01-04 MED ORDER — IOPAMIDOL (ISOVUE-300) INJECTION 61%
30.0000 mL | Freq: Once | INTRAVENOUS | Status: AC | PRN
  Administered 2018-01-04: 30 mL via ORAL

## 2018-01-06 ENCOUNTER — Other Ambulatory Visit: Payer: Self-pay | Admitting: Oncology

## 2018-01-07 ENCOUNTER — Ambulatory Visit: Payer: Medicare Other | Admitting: Adult Health

## 2018-01-08 ENCOUNTER — Telehealth: Payer: Self-pay | Admitting: Adult Health

## 2018-01-08 NOTE — Telephone Encounter (Signed)
Called and spoke with patient regarding CT results. Pt had CT scan on Chest on 01/02/2018; just resulted by radiologist 01/07/18 Advised pt TP has not reviewed the results at this time. Once the CT scan is reviewed by TP a nurse will call pt with results and recommendations. Pt verbalized understanding, and had no further questions nor concerns at this time. Nothing further needed.

## 2018-01-09 ENCOUNTER — Inpatient Hospital Stay: Payer: Medicare Other

## 2018-01-09 ENCOUNTER — Encounter: Payer: Self-pay | Admitting: Nurse Practitioner

## 2018-01-09 ENCOUNTER — Inpatient Hospital Stay: Payer: Medicare Other | Attending: Nurse Practitioner | Admitting: Nurse Practitioner

## 2018-01-09 ENCOUNTER — Telehealth: Payer: Self-pay | Admitting: Nurse Practitioner

## 2018-01-09 VITALS — BP 138/61 | HR 85 | Temp 98.3°F | Resp 18

## 2018-01-09 DIAGNOSIS — Z5111 Encounter for antineoplastic chemotherapy: Secondary | ICD-10-CM | POA: Insufficient documentation

## 2018-01-09 DIAGNOSIS — C561 Malignant neoplasm of right ovary: Secondary | ICD-10-CM | POA: Diagnosis not present

## 2018-01-09 DIAGNOSIS — Z905 Acquired absence of kidney: Secondary | ICD-10-CM | POA: Insufficient documentation

## 2018-01-09 DIAGNOSIS — C482 Malignant neoplasm of peritoneum, unspecified: Secondary | ICD-10-CM

## 2018-01-09 DIAGNOSIS — J910 Malignant pleural effusion: Secondary | ICD-10-CM | POA: Diagnosis not present

## 2018-01-09 DIAGNOSIS — Z90722 Acquired absence of ovaries, bilateral: Secondary | ICD-10-CM | POA: Diagnosis not present

## 2018-01-09 DIAGNOSIS — C5700 Malignant neoplasm of unspecified fallopian tube: Secondary | ICD-10-CM

## 2018-01-09 DIAGNOSIS — C562 Malignant neoplasm of left ovary: Secondary | ICD-10-CM | POA: Diagnosis not present

## 2018-01-09 DIAGNOSIS — Z90710 Acquired absence of both cervix and uterus: Secondary | ICD-10-CM | POA: Diagnosis not present

## 2018-01-09 DIAGNOSIS — Z79899 Other long term (current) drug therapy: Secondary | ICD-10-CM | POA: Diagnosis not present

## 2018-01-09 DIAGNOSIS — C5702 Malignant neoplasm of left fallopian tube: Secondary | ICD-10-CM | POA: Insufficient documentation

## 2018-01-09 DIAGNOSIS — J449 Chronic obstructive pulmonary disease, unspecified: Secondary | ICD-10-CM | POA: Insufficient documentation

## 2018-01-09 DIAGNOSIS — C5701 Malignant neoplasm of right fallopian tube: Secondary | ICD-10-CM | POA: Diagnosis not present

## 2018-01-09 DIAGNOSIS — Z9221 Personal history of antineoplastic chemotherapy: Secondary | ICD-10-CM

## 2018-01-09 DIAGNOSIS — C569 Malignant neoplasm of unspecified ovary: Secondary | ICD-10-CM

## 2018-01-09 DIAGNOSIS — C786 Secondary malignant neoplasm of retroperitoneum and peritoneum: Secondary | ICD-10-CM | POA: Diagnosis not present

## 2018-01-09 LAB — CBC WITH DIFFERENTIAL (CANCER CENTER ONLY)
BASOS ABS: 0 10*3/uL (ref 0.0–0.1)
BASOS PCT: 1 %
EOS ABS: 0.4 10*3/uL (ref 0.0–0.5)
EOS PCT: 7 %
HCT: 35 % (ref 34.8–46.6)
HEMOGLOBIN: 11.4 g/dL — AB (ref 11.6–15.9)
LYMPHS ABS: 1.1 10*3/uL (ref 0.9–3.3)
Lymphocytes Relative: 19 %
MCH: 30.4 pg (ref 25.1–34.0)
MCHC: 32.5 g/dL (ref 31.5–36.0)
MCV: 93.5 fL (ref 79.5–101.0)
Monocytes Absolute: 0.5 10*3/uL (ref 0.1–0.9)
Monocytes Relative: 9 %
NEUTROS PCT: 64 %
Neutro Abs: 3.9 10*3/uL (ref 1.5–6.5)
PLATELETS: 235 10*3/uL (ref 145–400)
RBC: 3.74 MIL/uL (ref 3.70–5.45)
RDW: 20.1 % — ABNORMAL HIGH (ref 11.2–14.5)
WBC Count: 6 10*3/uL (ref 3.9–10.3)

## 2018-01-09 MED ORDER — SODIUM CHLORIDE 0.9% FLUSH
10.0000 mL | INTRAVENOUS | Status: DC | PRN
  Administered 2018-01-09: 10 mL
  Filled 2018-01-09: qty 10

## 2018-01-09 MED ORDER — PROCHLORPERAZINE MALEATE 10 MG PO TABS
10.0000 mg | ORAL_TABLET | Freq: Once | ORAL | Status: AC
  Administered 2018-01-09: 10 mg via ORAL

## 2018-01-09 MED ORDER — HEPARIN SOD (PORK) LOCK FLUSH 100 UNIT/ML IV SOLN
500.0000 [IU] | Freq: Once | INTRAVENOUS | Status: AC | PRN
  Administered 2018-01-09: 500 [IU]
  Filled 2018-01-09: qty 5

## 2018-01-09 MED ORDER — SODIUM CHLORIDE 0.9 % IV SOLN
Freq: Once | INTRAVENOUS | Status: AC
  Administered 2018-01-09: 09:00:00 via INTRAVENOUS

## 2018-01-09 MED ORDER — PROCHLORPERAZINE MALEATE 10 MG PO TABS
ORAL_TABLET | ORAL | Status: AC
Start: 2018-01-09 — End: ?
  Filled 2018-01-09: qty 1

## 2018-01-09 MED ORDER — SODIUM CHLORIDE 0.9 % IV SOLN
800.0000 mg/m2 | Freq: Once | INTRAVENOUS | Status: AC
  Administered 2018-01-09: 1482 mg via INTRAVENOUS
  Filled 2018-01-09: qty 38.98

## 2018-01-09 NOTE — Progress Notes (Signed)
Discharge instructions reviewed and given to the pt.  Pt verbalized understanding. 

## 2018-01-09 NOTE — Telephone Encounter (Signed)
Scheduled appt per 7/3 los - left message for patient with appt date and time.

## 2018-01-09 NOTE — Patient Instructions (Addendum)
[facility] Discharge Instructions for Patients Receiving Chemotherapy  Today you received the following chemotherapy agents:  Gemzar.  To help prevent nausea and vomiting after your treatment, we encourage you to take your nausea medication as directed.   If you develop nausea and vomiting that is not controlled by your nausea medication, call the clinic.   BELOW ARE SYMPTOMS THAT SHOULD BE REPORTED IMMEDIATELY:  *FEVER GREATER THAN 100.5 F  *CHILLS WITH OR WITHOUT FEVER  NAUSEA AND VOMITING THAT IS NOT CONTROLLED WITH YOUR NAUSEA MEDICATION  *UNUSUAL SHORTNESS OF BREATH  *UNUSUAL BRUISING OR BLEEDING  TENDERNESS IN MOUTH AND THROAT WITH OR WITHOUT PRESENCE OF ULCERS  *URINARY PROBLEMS  *BOWEL PROBLEMS  UNUSUAL RASH Items with * indicate a potential emergency and should be followed up as soon as possible.  Feel free to call the clinic should you have any questions or concerns. The clinic phone number is [phone].  Please show the CHEMO ALERT CARD at check-in to the Emergency Department and triage nurse.  Gemcitabine injection What is this medicine? GEMCITABINE (jem SIT a been) is a chemotherapy drug. This medicine is used to treat many types of cancer like breast cancer, lung cancer, pancreatic cancer, and ovarian cancer. This medicine may be used for other purposes; ask your health care provider or pharmacist if you have questions. COMMON BRAND NAME(S): Gemzar What should I tell my health care provider before I take this medicine? They need to know if you have any of these conditions: -blood disorders -infection -kidney disease -liver disease -recent or ongoing radiation therapy -an unusual or allergic reaction to gemcitabine, other chemotherapy, other medicines, foods, dyes, or preservatives -pregnant or trying to get pregnant -breast-feeding How should I use this medicine? This drug is given as an infusion into a vein. It is administered in a  hospital or clinic by a specially trained health care professional. Talk to your pediatrician regarding the use of this medicine in children. Special care may be needed. Overdosage: If you think you have taken too much of this medicine contact a poison control center or emergency room at once. NOTE: This medicine is only for you. Do not share this medicine with others. What if I miss a dose? It is important not to miss your dose. Call your doctor or health care professional if you are unable to keep an appointment. What may interact with this medicine? -medicines to increase blood counts like filgrastim, pegfilgrastim, sargramostim -some other chemotherapy drugs like cisplatin -vaccines Talk to your doctor or health care professional before taking any of these medicines: -acetaminophen -aspirin -ibuprofen -ketoprofen -naproxen This list may not describe all possible interactions. Give your health care provider a list of all the medicines, herbs, non-prescription drugs, or dietary supplements you use. Also tell them if you smoke, drink alcohol, or use illegal drugs. Some items may interact with your medicine. What should I watch for while using this medicine? Visit your doctor for checks on your progress. This drug may make you feel generally unwell. This is not uncommon, as chemotherapy can affect healthy cells as well as cancer cells. Report any side effects. Continue your course of treatment even though you feel ill unless your doctor tells you to stop. In some cases, you may be given additional medicines to help with side effects. Follow all directions for their use. Call your doctor or health care professional for advice if you get a fever, chills or sore throat, or other symptoms of a cold   or flu. Do not treat yourself. This drug decreases your body's ability to fight infections. Try to avoid being around people who are sick. This medicine may increase your risk to bruise or bleed. Call  your doctor or health care professional if you notice any unusual bleeding. Be careful brushing and flossing your teeth or using a toothpick because you may get an infection or bleed more easily. If you have any dental work done, tell your dentist you are receiving this medicine. Avoid taking products that contain aspirin, acetaminophen, ibuprofen, naproxen, or ketoprofen unless instructed by your doctor. These medicines may hide a fever. Women should inform their doctor if they wish to become pregnant or think they might be pregnant. There is a potential for serious side effects to an unborn child. Talk to your health care professional or pharmacist for more information. Do not breast-feed an infant while taking this medicine. What side effects may I notice from receiving this medicine? Side effects that you should report to your doctor or health care professional as soon as possible: -allergic reactions like skin rash, itching or hives, swelling of the face, lips, or tongue -low blood counts - this medicine may decrease the number of white blood cells, red blood cells and platelets. You may be at increased risk for infections and bleeding. -signs of infection - fever or chills, cough, sore throat, pain or difficulty passing urine -signs of decreased platelets or bleeding - bruising, pinpoint red spots on the skin, black, tarry stools, blood in the urine -signs of decreased red blood cells - unusually weak or tired, fainting spells, lightheadedness -breathing problems -chest pain -mouth sores -nausea and vomiting -pain, swelling, redness at site where injected -pain, tingling, numbness in the hands or feet -stomach pain -swelling of ankles, feet, hands -unusual bleeding Side effects that usually do not require medical attention (report to your doctor or health care professional if they continue or are bothersome): -constipation -diarrhea -hair loss -loss of appetite -stomach upset This  list may not describe all possible side effects. Call your doctor for medical advice about side effects. You may report side effects to FDA at 1-800-FDA-1088. Where should I keep my medicine? This drug is given in a hospital or clinic and will not be stored at home. NOTE: This sheet is a summary. It may not cover all possible information. If you have questions about this medicine, talk to your doctor, pharmacist, or health care provider.  2018 Elsevier/Gold Standard (2007-11-05 18:45:54)    

## 2018-01-09 NOTE — Progress Notes (Signed)
[facility] OFFICE PROGRESS NOTE   Diagnosis: Fallopian tube carcinoma  INTERVAL HISTORY:   Ms. Redditt returns as scheduled.  She notes improvement in her energy level.  Shortness of breath continues to be improved.  No abdominal pain.  Bowels moving regularly.  No urinary symptoms.  She intermittently notes swelling at the lower legs.  She continues Lasix as needed.  Objective:  Vital signs in last 24 hours:  Temperature 98.3, heart rate 85, respirations 18, blood pressure 138/61    HEENT: No thrush or ulcers. Resp: Distant breath sounds.  No respiratory distress. Cardio: Regular rate and rhythm. GI: Abdomen soft and nontender.  No hepatomegaly.  No mass.  No apparent ascites. Vascular: No leg edema. Neuro: Alert and oriented.  Port-A-Cath without erythema.  Lab Results:  Lab Results  Component Value Date   WBC 6.0 01/09/2018   HGB 11.4 (L) 01/09/2018   HCT 35.0 01/09/2018   MCV 93.5 01/09/2018   PLT 235 01/09/2018   NEUTROABS 3.9 01/09/2018    Imaging:  No results found.  Medications: I have reviewed the patient's current medications.  Assessment/Plan: 1. 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 Significant improvement in malignant right pleural effusion.  Status post robotic-assisted laparoscopic hysterectomy with bilateral salpingoophorectomy, omentectomy, radical tumor debulking 12/29/2014. Per Dr. Serita Grit office note 01/14/2015 cytoreduction was optimal with residual disease remaining only in the 1 mm implants on the small intestine. 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  MSI-stable, mutation burden-4, BRAF rearrangement,ERBB4, KRAS G12D Cycle 1 adjuvant Taxol/carboplatin 01/20/2015 Cycle 2 adjuvant Taxol/carboplatin 02/10/2015 Cycle 3 adjuvant Taxol/carboplatin 03/10/2015 CA 125 on 1013 2016-42  CTs of the chest, abdomen, and pelvis 05/31/2015 with no evidence of progressive ovarian cancer  CTs of the chest, abdomen, and pelvis 08/07/2015 and 08/08/2015-no evidence of progressive ovarian cancer  Peritoneal studding noted at the time of the cholecystectomy procedure 08/09/2015 Elevated CA 125 10/07/2015  CT 10/07/2015 with stricturing at the sigmoid colon, constipation, and omental nodularity  Initiation of salvage weekly Taxol chemotherapy 10/13/2015  Taxol changed to every 2 weeks beginning 01/19/2016 due to painful neuropathy.  CT scans 07/19/2016 with no acute findings. No features in the abdomen or pelvis to suggest recurrent disease.Stable mild fullness right intrarenal collecting system.  Elevated CA 125 treatment resumed with Taxol/Avastin on a 2 week schedule 09/27/2016  CT 02/12/2017-no evidence of carcinomatosis, no evidence of progressive metastatic  disease  Taxol/Avastin continued every 2 weeks  CT 09/10/2017-increase in trace ascites, no evidence of progressive carcinomatosis, inflammatory changes at the lung bases with a new 3 mm right lower lobe nodule  Taxol/Avastin continued every 2 weeks  Progressive rise in the CA 125  Cycle 1 carboplatin 3-week schedule4/17/2019  Cycle 2  carboplatin 11/14/2017  Progressive malignant left pleural effusion June 2019  CT 01/07/2018- resolution of pulmonary nodule seen on prior examination.  New small bilateral pleural effusions left greater than right with areas of atelectasis.  Stable scattered mediastinal and hilar lymph nodes some partially calcified.  Stable emphysematous changes and areas of pulmonary scarring.  Small volume abdominal ascites but no omental or peritoneal surface disease.  Small scattered mesenteric and retroperitoneal lymph nodes stable.  Stable nodularity left adrenal gland.  Cycle 1 gemcitabine 01/09/2018 (day 1/day 8 schedule) 2. COPD-followed by Dr. Lamonte Sakai. 3. Dyspnea secondary to COPD and the large right pleural effusion, status post therapeutic thoracentesis procedures 09/30/2014,10/09/2014, and 10/21/2014. Left thoracentesis 10/30/2014  Progressive left pleural effusion noted on chest x-ray 12/17/2017  Left thoracentesis 12/19/2017, cytology positive for metastatic adenocarcinoma 4. Left nephrectomy as a child 5. Delayed nausea following cycle 1 Taxol/carboplatin, Aloxi/Emend added with cycle 2 with improvement. 6. Right lower extremity edema, right calf pain 12/09/2014. Negative venous Doppler 12/09/2014. 7. Diffuse pruritus following cycle 1 adjuvant Taxol/carboplatin 01/20/2015, no rash, resolved with a steroid dose pack 8. Thrombocytopenia second to chemotherapy, the carboplatin was dose reduced with cycle 2 adjuvant Taxol/carboplatin 02/10/2015 9. Chemotherapy-induced peripheral neuropathy-painful peripheral neuropathy involving the feet 01/19/2016.  10. Admission  with acute cholecystitis 08/07/2015, status post a cholecystectomy 08/09/2015 11. Admission 10/08/2015 with abdominal pain/constipation, improved with bowel rest and laxatives 12. Mild right hydronephrosis noted on the CT 10/07/2015. Renal ultrasound 12/16/2015 with no hydronephrosis noted.No hydronephrosis on CT 07/19/2016. 13. Anemia secondary to chronic disease and chemotherapy, status post a red cell transfusion 12/05/2017    Disposition: Ms. Scales appears stable.  We reviewed the recent CT scan results.  We again discussed potential toxicities associated with gemcitabine.  She agrees to proceed.  Plan to proceed with cycle 1 day 1 gemcitabine today as scheduled.  She will return in 1 week for the day 8 treatment.  We will see her in follow-up with cycle 2-day 1 in 3 weeks.  She will contact the office in the interim with any problems.    Ned Card ANP/GNP-BC   01/09/2018  9:09 AM

## 2018-01-12 ENCOUNTER — Other Ambulatory Visit: Payer: Self-pay | Admitting: Oncology

## 2018-01-16 ENCOUNTER — Other Ambulatory Visit: Payer: Self-pay | Admitting: *Deleted

## 2018-01-16 ENCOUNTER — Inpatient Hospital Stay: Payer: Medicare Other

## 2018-01-16 VITALS — BP 137/71 | HR 78 | Temp 98.1°F | Resp 18 | Ht 61.0 in | Wt 172.0 lb

## 2018-01-16 DIAGNOSIS — Z95828 Presence of other vascular implants and grafts: Secondary | ICD-10-CM

## 2018-01-16 DIAGNOSIS — C482 Malignant neoplasm of peritoneum, unspecified: Secondary | ICD-10-CM

## 2018-01-16 DIAGNOSIS — C5700 Malignant neoplasm of unspecified fallopian tube: Secondary | ICD-10-CM

## 2018-01-16 DIAGNOSIS — C561 Malignant neoplasm of right ovary: Secondary | ICD-10-CM | POA: Diagnosis not present

## 2018-01-16 DIAGNOSIS — C786 Secondary malignant neoplasm of retroperitoneum and peritoneum: Secondary | ICD-10-CM | POA: Diagnosis not present

## 2018-01-16 DIAGNOSIS — Z5111 Encounter for antineoplastic chemotherapy: Secondary | ICD-10-CM | POA: Diagnosis not present

## 2018-01-16 DIAGNOSIS — C5702 Malignant neoplasm of left fallopian tube: Secondary | ICD-10-CM | POA: Diagnosis not present

## 2018-01-16 DIAGNOSIS — C562 Malignant neoplasm of left ovary: Secondary | ICD-10-CM | POA: Diagnosis not present

## 2018-01-16 DIAGNOSIS — C5701 Malignant neoplasm of right fallopian tube: Secondary | ICD-10-CM | POA: Diagnosis not present

## 2018-01-16 DIAGNOSIS — C569 Malignant neoplasm of unspecified ovary: Secondary | ICD-10-CM

## 2018-01-16 LAB — CBC WITH DIFFERENTIAL (CANCER CENTER ONLY)
BASOS ABS: 0 10*3/uL (ref 0.0–0.1)
BASOS PCT: 0 %
Eosinophils Absolute: 0.1 10*3/uL (ref 0.0–0.5)
Eosinophils Relative: 3 %
HEMATOCRIT: 31.1 % — AB (ref 34.8–46.6)
HEMOGLOBIN: 10.3 g/dL — AB (ref 11.6–15.9)
Lymphocytes Relative: 42 %
Lymphs Abs: 1 10*3/uL (ref 0.9–3.3)
MCH: 30.6 pg (ref 25.1–34.0)
MCHC: 33.1 g/dL (ref 31.5–36.0)
MCV: 92.5 fL (ref 79.5–101.0)
MONO ABS: 0.3 10*3/uL (ref 0.1–0.9)
Monocytes Relative: 12 %
NEUTROS ABS: 1 10*3/uL — AB (ref 1.5–6.5)
NEUTROS PCT: 43 %
Platelet Count: 135 10*3/uL — ABNORMAL LOW (ref 145–400)
RBC: 3.36 MIL/uL — AB (ref 3.70–5.45)
RDW: 19 % — AB (ref 11.2–14.5)
WBC Count: 2.4 10*3/uL — ABNORMAL LOW (ref 3.9–10.3)

## 2018-01-16 MED ORDER — SODIUM CHLORIDE 0.9 % IJ SOLN
10.0000 mL | INTRAMUSCULAR | Status: DC | PRN
  Administered 2018-01-16: 10 mL via INTRAVENOUS
  Filled 2018-01-16: qty 10

## 2018-01-16 MED ORDER — HEPARIN SOD (PORK) LOCK FLUSH 100 UNIT/ML IV SOLN
500.0000 [IU] | Freq: Once | INTRAVENOUS | Status: AC | PRN
Start: 2018-01-16 — End: 2018-01-16
  Administered 2018-01-16: 500 [IU]
  Filled 2018-01-16: qty 5

## 2018-01-16 MED ORDER — SODIUM CHLORIDE 0.9% FLUSH
10.0000 mL | INTRAVENOUS | Status: DC | PRN
  Administered 2018-01-16: 10 mL
  Filled 2018-01-16: qty 10

## 2018-01-16 NOTE — Patient Instructions (Signed)
Neutropenia Neutropenia is a condition that occurs when you have a lower-than-normal level of a type of white blood cell (neutrophil) in your body. Neutrophils are made in the spongy center of large bones (bone marrow) and they fight infections. Neutrophils are your body's main defense against bacterial and fungal infections. The fewer neutrophils you have and the longer your body remains without them, the greater your risk of getting a severe infection. What are the causes? This condition can occur if your body uses up or destroys neutrophils faster than your bone marrow can make them. This problem may happen because of:  Bacterial or fungal infection.  Allergic disorders.  Reactions to some medicines.  Autoimmune disease.  An enlarged spleen.  This condition can also occur if your bone marrow does not produce enough neutrophils. This problem may be caused by:  Cancer.  Cancer treatments, such as radiation or chemotherapy.  Viral infections.  Medicines, such as phenytoin.  Vitamin B12 deficiency.  Diseases of the bone marrow.  Environmental toxins, such as insecticides.  What are the signs or symptoms? This condition does not usually cause symptoms. If symptoms are present, they are usually caused by an underlying infection. Symptoms of an infection may include:  Fever.  Chills.  Swollen glands.  Oral or anal ulcers.  Cough and shortness of breath.  Rash.  Skin infection.  Fatigue.  How is this diagnosed? Your health care provider may suspect neutropenia if you have:  A condition that may cause neutropenia.  Symptoms of infection, especially fever.  Frequent and unusual infections.  You will have a medical history and physical exam. Tests will also be done, such as:  A complete blood count (CBC).  A procedure to collect a sample of bone marrow for examination (bone marrow biopsy).  A chest X-ray.  A urine culture.  A blood culture.  How is this  treated? Treatment depends on the underlying cause and severity of your condition. Mild neutropenia may not require treatment. Treatment may include medicines, such as:  Antibiotic medicine given through an IV tube.  Antiviral medicines.  Antifungal medicines.  A medicine to increase neutrophil production (colony-stimulating factor). You may get this drug through an IV tube or by injection.  Steroids given through an IV tube.  If an underlying condition is causing neutropenia, you may need treatment for that condition. If medicines you are taking are causing neutropenia, your health care provider may have you stop taking those medicines. Follow these instructions at home: Medicines  Take over-the-counter and prescription medicines only as told by your health care provider.  Get a seasonal flu shot (influenza vaccine). Lifestyle  Do not eat unpasteurized foods.Do not eat unwashed raw fruits or vegetables.  Avoid exposure to groups of people or children.  Avoid being around people who are sick.  Avoid being around dirt or dust, such as in construction areas or gardens.  Do not provide direct care for pets. Avoid animal droppings. Do not clean litter boxes and bird cages. Hygiene   Bathe daily.  Clean the area between the genitals and the anus (perineal area) after you urinate or have a bowel movement. If you are female, wipe from front to back.  Brush your teeth with a soft toothbrush before and after meals.  Do not use a razor that has a blade. Use an electric razor to remove hair.  Wash your hands often. Make sure others who come in contact with you also wash their hands. If soap and water  are not available, use hand sanitizer. General instructions  Do not have sex unless your health care provider has approved.  Take actions to avoid cuts and burns. For example: ? Be cautious when you use knives. Always cut away from yourself. ? Keep knives in protective sheaths or  guards when not in use. ? Use oven mitts when you cook with a hot stove, oven, or grill. ? Stand a safe distance away from open fires.  Avoid people who received a vaccine in the past 30 days if that vaccine contained a live version of the germ (live vaccine). You should not get a live vaccine. Common live vaccines are varicella, measles, mumps, and rubella.  Do not share food utensils.  Do not use tampons, enemas, or rectal suppositories unless your health care provider has approved.  Keep all appointments as told by your health care provider. This is important. Contact a health care provider if:  You have a fever.  You have chills or you start to shake.  You have: ? A sore throat. ? A warm, red, or tender area on your skin. ? A cough. ? Frequent or painful urination. ? Vaginal discharge or itching.  You develop: ? Sores in your mouth or anus. ? Swollen lymph nodes. ? Red streaks on the skin. ? A rash.  You feel: ? Nauseous or you vomit. ? Very fatigued. ? Short of breath. This information is not intended to replace advice given to you by your health care provider. Make sure you discuss any questions you have with your health care provider. Document Released: 12/16/2001 Document Revised: 12/02/2015 Document Reviewed: 01/06/2015 Elsevier Interactive Patient Education  [employer].

## 2018-01-16 NOTE — Progress Notes (Signed)
Per Dr. Benay Spice draw just CBC today for treatment.

## 2018-01-16 NOTE — Progress Notes (Signed)
Per Dr. Benay Spice, pt will not receive treatment today d/t low ANC. Pt will have labs drawn at next appt on 01/30/18 to see if regimen needs to be changed to every 2 weeks. Pt instructed on plan and verbalized understanding. Neutropenic precautions reviewed with pt and spouse.

## 2018-01-19 DIAGNOSIS — M170 Bilateral primary osteoarthritis of knee: Secondary | ICD-10-CM | POA: Diagnosis not present

## 2018-01-21 ENCOUNTER — Encounter: Payer: Self-pay | Admitting: *Deleted

## 2018-01-23 ENCOUNTER — Ambulatory Visit: Payer: Medicare Other | Admitting: Family Medicine

## 2018-01-23 ENCOUNTER — Ambulatory Visit: Payer: Medicare Other

## 2018-01-24 ENCOUNTER — Other Ambulatory Visit: Payer: Self-pay | Admitting: Oncology

## 2018-01-30 ENCOUNTER — Inpatient Hospital Stay: Payer: Medicare Other

## 2018-01-30 ENCOUNTER — Telehealth: Payer: Self-pay | Admitting: Oncology

## 2018-01-30 ENCOUNTER — Inpatient Hospital Stay (HOSPITAL_BASED_OUTPATIENT_CLINIC_OR_DEPARTMENT_OTHER): Payer: Medicare Other | Admitting: Oncology

## 2018-01-30 VITALS — BP 145/54 | HR 66 | Temp 97.8°F | Resp 17 | Ht 61.0 in | Wt 172.3 lb

## 2018-01-30 DIAGNOSIS — C569 Malignant neoplasm of unspecified ovary: Secondary | ICD-10-CM

## 2018-01-30 DIAGNOSIS — Z79899 Other long term (current) drug therapy: Secondary | ICD-10-CM

## 2018-01-30 DIAGNOSIS — C562 Malignant neoplasm of left ovary: Secondary | ICD-10-CM

## 2018-01-30 DIAGNOSIS — C5700 Malignant neoplasm of unspecified fallopian tube: Secondary | ICD-10-CM

## 2018-01-30 DIAGNOSIS — J449 Chronic obstructive pulmonary disease, unspecified: Secondary | ICD-10-CM | POA: Diagnosis not present

## 2018-01-30 DIAGNOSIS — Z90722 Acquired absence of ovaries, bilateral: Secondary | ICD-10-CM

## 2018-01-30 DIAGNOSIS — Z90710 Acquired absence of both cervix and uterus: Secondary | ICD-10-CM

## 2018-01-30 DIAGNOSIS — Z905 Acquired absence of kidney: Secondary | ICD-10-CM

## 2018-01-30 DIAGNOSIS — C5701 Malignant neoplasm of right fallopian tube: Secondary | ICD-10-CM

## 2018-01-30 DIAGNOSIS — C482 Malignant neoplasm of peritoneum, unspecified: Secondary | ICD-10-CM

## 2018-01-30 DIAGNOSIS — C5702 Malignant neoplasm of left fallopian tube: Secondary | ICD-10-CM

## 2018-01-30 DIAGNOSIS — C786 Secondary malignant neoplasm of retroperitoneum and peritoneum: Secondary | ICD-10-CM

## 2018-01-30 DIAGNOSIS — J910 Malignant pleural effusion: Secondary | ICD-10-CM

## 2018-01-30 DIAGNOSIS — C561 Malignant neoplasm of right ovary: Secondary | ICD-10-CM

## 2018-01-30 DIAGNOSIS — Z95828 Presence of other vascular implants and grafts: Secondary | ICD-10-CM

## 2018-01-30 DIAGNOSIS — Z9221 Personal history of antineoplastic chemotherapy: Secondary | ICD-10-CM

## 2018-01-30 DIAGNOSIS — Z5111 Encounter for antineoplastic chemotherapy: Secondary | ICD-10-CM | POA: Diagnosis not present

## 2018-01-30 LAB — CMP (CANCER CENTER ONLY)
ALBUMIN: 3.3 g/dL — AB (ref 3.5–5.0)
ALK PHOS: 84 U/L (ref 38–126)
ALT: 15 U/L (ref 0–44)
ANION GAP: 8 (ref 5–15)
AST: 16 U/L (ref 15–41)
BUN: 19 mg/dL (ref 8–23)
CO2: 29 mmol/L (ref 22–32)
Calcium: 9.5 mg/dL (ref 8.9–10.3)
Chloride: 104 mmol/L (ref 98–111)
Creatinine: 0.85 mg/dL (ref 0.44–1.00)
GFR, Est AFR Am: >60 mL/min (ref >60)
GFR, Estimated: >60 mL/min (ref >60)
GLUCOSE: 102 mg/dL — AB (ref 70–99)
POTASSIUM: 4 mmol/L (ref 3.5–5.1)
SODIUM: 141 mmol/L (ref 135–145)
Total Bilirubin: 0.3 mg/dL (ref 0.3–1.2)
Total Protein: 7.4 g/dL (ref 6.5–8.1)

## 2018-01-30 LAB — CBC WITH DIFFERENTIAL (CANCER CENTER ONLY)
BASOS ABS: 0 10*3/uL (ref 0.0–0.1)
BASOS PCT: 0 %
EOS ABS: 0.1 10*3/uL (ref 0.0–0.5)
Eosinophils Relative: 1 %
HCT: 36 % (ref 34.8–46.6)
HEMOGLOBIN: 11 g/dL — AB (ref 11.6–15.9)
Lymphocytes Relative: 21 %
Lymphs Abs: 1.3 10*3/uL (ref 0.9–3.3)
MCH: 30.7 pg (ref 25.1–34.0)
MCHC: 30.6 g/dL — AB (ref 31.5–36.0)
MCV: 100.6 fL (ref 79.5–101.0)
MONOS PCT: 9 %
Monocytes Absolute: 0.6 10*3/uL (ref 0.1–0.9)
NEUTROS PCT: 69 %
Neutro Abs: 4.3 10*3/uL (ref 1.5–6.5)
Platelet Count: 269 10*3/uL (ref 145–400)
RBC: 3.58 MIL/uL — ABNORMAL LOW (ref 3.70–5.45)
RDW: 19.4 % — AB (ref 11.2–14.5)
WBC: 6.2 10*3/uL (ref 3.9–10.3)

## 2018-01-30 MED ORDER — SODIUM CHLORIDE 0.9 % IV SOLN
Freq: Once | INTRAVENOUS | Status: AC
  Administered 2018-01-30: 11:00:00 via INTRAVENOUS
  Filled 2018-01-30: qty 250

## 2018-01-30 MED ORDER — SODIUM CHLORIDE 0.9% FLUSH
10.0000 mL | INTRAVENOUS | Status: DC | PRN
  Administered 2018-01-30: 10 mL
  Filled 2018-01-30: qty 10

## 2018-01-30 MED ORDER — SODIUM CHLORIDE 0.9 % IV SOLN
640.0000 mg/m2 | Freq: Once | INTRAVENOUS | Status: AC
  Administered 2018-01-30: 1178 mg via INTRAVENOUS
  Filled 2018-01-30: qty 30.98

## 2018-01-30 MED ORDER — HEPARIN SOD (PORK) LOCK FLUSH 100 UNIT/ML IV SOLN
500.0000 [IU] | Freq: Once | INTRAVENOUS | Status: AC | PRN
  Administered 2018-01-30: 500 [IU]
  Filled 2018-01-30: qty 5

## 2018-01-30 MED ORDER — PROCHLORPERAZINE MALEATE 10 MG PO TABS
10.0000 mg | ORAL_TABLET | Freq: Once | ORAL | Status: AC
  Administered 2018-01-30: 10 mg via ORAL

## 2018-01-30 MED ORDER — SODIUM CHLORIDE 0.9 % IJ SOLN
10.0000 mL | INTRAMUSCULAR | Status: DC | PRN
  Administered 2018-01-30: 10 mL via INTRAVENOUS
  Filled 2018-01-30: qty 10

## 2018-01-30 MED ORDER — PROCHLORPERAZINE MALEATE 10 MG PO TABS
ORAL_TABLET | ORAL | Status: AC
  Filled 2018-01-30: qty 1

## 2018-01-30 NOTE — Patient Instructions (Signed)
[facility] Discharge Instructions for Patients Receiving Chemotherapy  Today you received the following chemotherapy agents gemzar.  To help prevent nausea and vomiting after your treatment, we encourage you to take your nausea medication as directed.   If you develop nausea and vomiting that is not controlled by your nausea medication, call the clinic.   BELOW ARE SYMPTOMS THAT SHOULD BE REPORTED IMMEDIATELY:  *FEVER GREATER THAN 100.5 F  *CHILLS WITH OR WITHOUT FEVER  NAUSEA AND VOMITING THAT IS NOT CONTROLLED WITH YOUR NAUSEA MEDICATION  *UNUSUAL SHORTNESS OF BREATH  *UNUSUAL BRUISING OR BLEEDING  TENDERNESS IN MOUTH AND THROAT WITH OR WITHOUT PRESENCE OF ULCERS  *URINARY PROBLEMS  *BOWEL PROBLEMS  UNUSUAL RASH Items with * indicate a potential emergency and should be followed up as soon as possible.  Feel free to call the clinic should you have any questions or concerns. The clinic phone number is [phone].  Please show the CHEMO ALERT CARD at check-in to the Emergency Department and triage nurse.   

## 2018-01-30 NOTE — Telephone Encounter (Signed)
Scheduled appt per 7/24 los - gave patient aVS and calender per los.

## 2018-01-30 NOTE — Progress Notes (Signed)
Lynn Ramirez   Diagnosis: Fallopian tube carcinoma  INTERVAL HISTORY:   Lynn Ramirez completed a first treatment with gemcitabine 01/09/2018.  Day 8 chemotherapy was held secondary to neutropenia.  She reports Lynn Ramirez following chemotherapy.  She had chills on the evening of day 1.  No fever.  No rash.  Leg edema has resolved.  No change in her respiratory status.  She was able to take a trip to the mountains.  Objective:  Vital signs in last 24 hours:  Blood pressure (!) 145/54, pulse 66, temperature 97.8 F (36.6 C), temperature source Oral, resp. rate 17, height '5\' 1"'$  (1.549 m), weight 172 lb 4.8 oz (78.2 kg), SpO2 100 %.    HEENT: No thrush Resp: Distant breath sounds, inspiratory rhonchi at the left posterior base, no respiratory distress Cardio: Regular rate and rhythm GI: No mass, no apparent ascites Vascular: No leg edema   Portacath/PICC-without erythema  Lab Results:  Lab Results  Component Value Date   WBC 6.2 01/30/2018   HGB 11.0 (L) 01/30/2018   HCT 36.0 01/30/2018   MCV 100.6 01/30/2018   PLT 269 01/30/2018   NEUTROABS 4.3 01/30/2018    CMP  Lab Results  Component Value Date   NA 139 01/02/2018   K 3.9 01/02/2018   CL 97 (L) 01/02/2018   CO2 32 01/02/2018   GLUCOSE 115 (H) 01/02/2018   BUN 17 01/02/2018   CREATININE 0.81 01/02/2018   CALCIUM 10.1 01/02/2018   PROT 8.0 01/02/2018   ALBUMIN 3.2 (L) 01/02/2018   AST 21 01/02/2018   ALT 9 01/02/2018   ALKPHOS 75 01/02/2018   BILITOT 0.4 01/02/2018   GFRNONAA >60 01/02/2018   GFRAA >60 01/02/2018     Medications: I have reviewed the patient's current medications.   Assessment/Plan: 1. 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 Significant improvement in malignant right pleural effusion.  Status post robotic-assisted laparoscopic hysterectomy with bilateral salpingoophorectomy, omentectomy, radical tumor debulking 12/29/2014. Per Lynn Ramirez office Ramirez 01/14/2015 cytoreduction was optimal with residual disease remaining only in the 1 mm implants on the small intestine. 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  MSI-stable, mutation burden-4, BRAF rearrangement,ERBB4, KRAS G12D Cycle 1 adjuvant Taxol/carboplatin 01/20/2015 Cycle 2 adjuvant Taxol/carboplatin 02/10/2015 Cycle 3 adjuvant Taxol/carboplatin 03/10/2015 CA 125 on 1013 2016-42  CTs of the chest, abdomen, and pelvis 05/31/2015 with no evidence of progressive ovarian cancer  CTs of the chest, abdomen, and pelvis 08/07/2015 and 08/08/2015-no evidence of progressive ovarian cancer  Peritoneal studding noted at the time of the cholecystectomy procedure  08/09/2015 Elevated CA 125 10/07/2015  CT 10/07/2015 with stricturing at the sigmoid colon, constipation, and omental nodularity  Initiation of salvage weekly Taxol chemotherapy 10/13/2015  Taxol changed to every 2 weeks beginning 01/19/2016 due to painful neuropathy.  CT scans 07/19/2016 with no acute findings. No features in the abdomen or pelvis to suggest recurrent disease.Stable mild fullness right intrarenal collecting system.  Elevated CA 125 treatment resumed with Taxol/Avastin on a 2 week schedule 09/27/2016  CT 02/12/2017-no evidence of carcinomatosis, no evidence of progressive metastatic disease  Taxol/Avastin continued every 2 weeks  CT 09/10/2017-increase in trace ascites, no evidence of progressive carcinomatosis, inflammatory changes at the lung bases with a new 3 mm right lower lobe nodule  Taxol/Avastin continued every 2 weeks  Progressive rise in the CA 125  Cycle 1 carboplatin 3-week schedule4/17/2019  Cycle 2 carboplatin 11/14/2017  Progressive malignant left pleural effusion June 2019  CT 01/07/2018- resolution of pulmonary nodule seen on prior examination.  New small bilateral pleural effusions left greater than right with areas of atelectasis.  Stable scattered mediastinal and hilar lymph nodes some partially calcified.  Stable emphysematous changes and areas of pulmonary scarring.  Small volume abdominal ascites but no omental or peritoneal surface disease.  Small scattered mesenteric and retroperitoneal lymph nodes stable.  Stable nodularity left adrenal gland.  Cycle 1 gemcitabine 01/09/2018 (day 1/day 8 schedule) 2. COPD-followed by Lynn Ramirez. 3. Dyspnea secondary to COPD and the large right pleural effusion, status post therapeutic thoracentesis procedures 09/30/2014,10/09/2014, and 10/21/2014. Left thoracentesis 10/30/2014  Progressive left pleural effusion noted on chest x-ray 12/17/2017  Left thoracentesis 12/19/2017, cytology positive for metastatic  adenocarcinoma 4. Left nephrectomy as a child 5. Delayed nausea following cycle 1 Taxol/carboplatin, Aloxi/Emend added with cycle 2 with improvement. 6. Right lower extremity edema, right calf pain 12/09/2014. Negative venous Doppler 12/09/2014. 7. Diffuse pruritus following cycle 1 adjuvant Taxol/carboplatin 01/20/2015, no rash, resolved with a steroid dose pack 8. Thrombocytopenia second to chemotherapy, the carboplatin was dose reduced with cycle 2 adjuvant Taxol/carboplatin 02/10/2015 9. Chemotherapy-induced peripheral neuropathy-painful peripheral neuropathy involving the feet 01/19/2016.  10. Admission with acute cholecystitis 08/07/2015, status post a cholecystectomy 08/09/2015 11. Admission 10/08/2015 with abdominal pain/constipation, improved with bowel rest and laxatives 12. Mild right hydronephrosis noted on the CT 10/07/2015. Renal ultrasound 12/16/2015 with no hydronephrosis noted.No hydronephrosis on CT 07/19/2016. 13. Anemia secondary to chronic disease and chemotherapy, status post a red cell transfusion 12/05/2017  Disposition: Lynn Ramirez appears stable.  She tolerated the first treatment with gemcitabine well.  Day 8 chemotherapy was held secondary to neutropenia.  The white count has recovered.  The plan is to proceed with cycle 2 gemcitabine today.  We dose reduce the gemcitabine beginning with day 1 cycle 2.  She will return for day 8 chemotherapy in 1 week.  The plan is to switch to an every 2-week schedule if the white count is low when she is here next week.  Lynn Ramirez will return for an office visit prior to cycle 3 chemotherapy on 02/20/2018.  25 minutes were spent with the patient today.  The majority of the time was used for counseling and coordination of care.  Betsy Coder, MD  01/30/2018  8:50 AM

## 2018-01-31 ENCOUNTER — Telehealth: Payer: Self-pay

## 2018-01-31 LAB — CA 125: CANCER ANTIGEN (CA) 125: 122 U/mL — AB (ref 0.0–38.1)

## 2018-01-31 NOTE — Telephone Encounter (Signed)
Returned call to pt regarding CA 125 results. Per Dr. Benay Spice, CA125 looks good. Results read to pt. Pt voiced understanding.

## 2018-02-03 ENCOUNTER — Other Ambulatory Visit: Payer: Self-pay | Admitting: Oncology

## 2018-02-06 ENCOUNTER — Inpatient Hospital Stay: Payer: Medicare Other

## 2018-02-06 VITALS — BP 138/69 | HR 69 | Temp 97.8°F | Resp 18 | Ht 61.0 in | Wt 169.4 lb

## 2018-02-06 DIAGNOSIS — C786 Secondary malignant neoplasm of retroperitoneum and peritoneum: Secondary | ICD-10-CM | POA: Diagnosis not present

## 2018-02-06 DIAGNOSIS — C5700 Malignant neoplasm of unspecified fallopian tube: Secondary | ICD-10-CM

## 2018-02-06 DIAGNOSIS — Z95828 Presence of other vascular implants and grafts: Secondary | ICD-10-CM

## 2018-02-06 DIAGNOSIS — C482 Malignant neoplasm of peritoneum, unspecified: Secondary | ICD-10-CM

## 2018-02-06 DIAGNOSIS — C569 Malignant neoplasm of unspecified ovary: Secondary | ICD-10-CM

## 2018-02-06 DIAGNOSIS — C5701 Malignant neoplasm of right fallopian tube: Secondary | ICD-10-CM | POA: Diagnosis not present

## 2018-02-06 DIAGNOSIS — C562 Malignant neoplasm of left ovary: Secondary | ICD-10-CM | POA: Diagnosis not present

## 2018-02-06 DIAGNOSIS — C561 Malignant neoplasm of right ovary: Secondary | ICD-10-CM | POA: Diagnosis not present

## 2018-02-06 DIAGNOSIS — C5702 Malignant neoplasm of left fallopian tube: Secondary | ICD-10-CM | POA: Diagnosis not present

## 2018-02-06 DIAGNOSIS — Z5111 Encounter for antineoplastic chemotherapy: Secondary | ICD-10-CM | POA: Diagnosis not present

## 2018-02-06 LAB — CMP (CANCER CENTER ONLY)
ALBUMIN: 3.3 g/dL — AB (ref 3.5–5.0)
ALT: 20 U/L (ref 0–44)
ANION GAP: 7 (ref 5–15)
AST: 16 U/L (ref 15–41)
Alkaline Phosphatase: 79 U/L (ref 38–126)
BILIRUBIN TOTAL: 0.4 mg/dL (ref 0.3–1.2)
BUN: 24 mg/dL — ABNORMAL HIGH (ref 8–23)
CO2: 33 mmol/L — AB (ref 22–32)
Calcium: 9.8 mg/dL (ref 8.9–10.3)
Chloride: 101 mmol/L (ref 98–111)
Creatinine: 0.81 mg/dL (ref 0.44–1.00)
GFR, Est AFR Am: >60 mL/min (ref >60)
GFR, Estimated: >60 mL/min (ref >60)
GLUCOSE: 104 mg/dL — AB (ref 70–99)
POTASSIUM: 3.9 mmol/L (ref 3.5–5.1)
SODIUM: 141 mmol/L (ref 135–145)
TOTAL PROTEIN: 7.5 g/dL (ref 6.5–8.1)

## 2018-02-06 LAB — CBC WITH DIFFERENTIAL (CANCER CENTER ONLY)
BASOS ABS: 0 10*3/uL (ref 0.0–0.1)
BASOS PCT: 0 %
Eosinophils Absolute: 0 10*3/uL (ref 0.0–0.5)
Eosinophils Relative: 1 %
HCT: 34.8 % (ref 34.8–46.6)
Hemoglobin: 10.8 g/dL — ABNORMAL LOW (ref 11.6–15.9)
Lymphocytes Relative: 33 %
Lymphs Abs: 1.3 10*3/uL (ref 0.9–3.3)
MCH: 30.9 pg (ref 25.1–34.0)
MCHC: 31 g/dL — ABNORMAL LOW (ref 31.5–36.0)
MCV: 99.4 fL (ref 79.5–101.0)
MONO ABS: 0.4 10*3/uL (ref 0.1–0.9)
Monocytes Relative: 10 %
NEUTROS PCT: 56 %
Neutro Abs: 2.1 10*3/uL (ref 1.5–6.5)
Platelet Count: 133 10*3/uL — ABNORMAL LOW (ref 145–400)
RBC: 3.5 MIL/uL — ABNORMAL LOW (ref 3.70–5.45)
RDW: 18 % — AB (ref 11.2–14.5)
WBC Count: 3.8 10*3/uL — ABNORMAL LOW (ref 3.9–10.3)

## 2018-02-06 MED ORDER — SODIUM CHLORIDE 0.9 % IV SOLN
Freq: Once | INTRAVENOUS | Status: AC
  Administered 2018-02-06: 12:00:00 via INTRAVENOUS
  Filled 2018-02-06: qty 250

## 2018-02-06 MED ORDER — SODIUM CHLORIDE 0.9 % IV SOLN
640.0000 mg/m2 | Freq: Once | INTRAVENOUS | Status: AC
  Administered 2018-02-06: 1178 mg via INTRAVENOUS
  Filled 2018-02-06: qty 30.98

## 2018-02-06 MED ORDER — SODIUM CHLORIDE 0.9% FLUSH
10.0000 mL | INTRAVENOUS | Status: DC | PRN
  Administered 2018-02-06: 10 mL
  Filled 2018-02-06: qty 10

## 2018-02-06 MED ORDER — PROCHLORPERAZINE MALEATE 10 MG PO TABS
ORAL_TABLET | ORAL | Status: AC
  Filled 2018-02-06: qty 1

## 2018-02-06 MED ORDER — HEPARIN SOD (PORK) LOCK FLUSH 100 UNIT/ML IV SOLN
500.0000 [IU] | Freq: Once | INTRAVENOUS | Status: AC | PRN
  Administered 2018-02-06: 500 [IU]
  Filled 2018-02-06: qty 5

## 2018-02-06 MED ORDER — SODIUM CHLORIDE 0.9 % IJ SOLN
10.0000 mL | INTRAMUSCULAR | Status: DC | PRN
  Administered 2018-02-06: 10 mL via INTRAVENOUS
  Filled 2018-02-06: qty 10

## 2018-02-06 MED ORDER — PROCHLORPERAZINE MALEATE 10 MG PO TABS
10.0000 mg | ORAL_TABLET | Freq: Once | ORAL | Status: AC
  Administered 2018-02-06: 10 mg via ORAL

## 2018-02-15 ENCOUNTER — Encounter: Payer: Self-pay | Admitting: *Deleted

## 2018-02-17 ENCOUNTER — Other Ambulatory Visit: Payer: Self-pay | Admitting: Oncology

## 2018-02-21 ENCOUNTER — Inpatient Hospital Stay: Payer: Medicare Other

## 2018-02-21 ENCOUNTER — Inpatient Hospital Stay: Payer: Medicare Other | Attending: Nurse Practitioner

## 2018-02-21 ENCOUNTER — Telehealth: Payer: Self-pay | Admitting: Oncology

## 2018-02-21 ENCOUNTER — Inpatient Hospital Stay (HOSPITAL_BASED_OUTPATIENT_CLINIC_OR_DEPARTMENT_OTHER): Payer: Medicare Other | Admitting: Oncology

## 2018-02-21 VITALS — BP 140/50 | HR 80 | Temp 97.6°F | Resp 20 | Ht 61.0 in | Wt 171.8 lb

## 2018-02-21 DIAGNOSIS — C5700 Malignant neoplasm of unspecified fallopian tube: Secondary | ICD-10-CM

## 2018-02-21 DIAGNOSIS — C561 Malignant neoplasm of right ovary: Secondary | ICD-10-CM | POA: Insufficient documentation

## 2018-02-21 DIAGNOSIS — J449 Chronic obstructive pulmonary disease, unspecified: Secondary | ICD-10-CM | POA: Diagnosis not present

## 2018-02-21 DIAGNOSIS — C482 Malignant neoplasm of peritoneum, unspecified: Secondary | ICD-10-CM

## 2018-02-21 DIAGNOSIS — Z5111 Encounter for antineoplastic chemotherapy: Secondary | ICD-10-CM | POA: Diagnosis not present

## 2018-02-21 DIAGNOSIS — C569 Malignant neoplasm of unspecified ovary: Secondary | ICD-10-CM

## 2018-02-21 DIAGNOSIS — Z95828 Presence of other vascular implants and grafts: Secondary | ICD-10-CM

## 2018-02-21 LAB — CBC WITH DIFFERENTIAL (CANCER CENTER ONLY)
Basophils Absolute: 0 10*3/uL (ref 0.0–0.1)
Basophils Relative: 0 %
EOS ABS: 0.1 10*3/uL (ref 0.0–0.5)
Eosinophils Relative: 2 %
HCT: 33.9 % — ABNORMAL LOW (ref 34.8–46.6)
HEMOGLOBIN: 10.5 g/dL — AB (ref 11.6–15.9)
LYMPHS ABS: 1 10*3/uL (ref 0.9–3.3)
LYMPHS PCT: 20 %
MCH: 31.1 pg (ref 25.1–34.0)
MCHC: 31 g/dL — ABNORMAL LOW (ref 31.5–36.0)
MCV: 100.3 fL (ref 79.5–101.0)
Monocytes Absolute: 0.6 10*3/uL (ref 0.1–0.9)
Monocytes Relative: 11 %
NEUTROS PCT: 67 %
Neutro Abs: 3.4 10*3/uL (ref 1.5–6.5)
Platelet Count: 323 10*3/uL (ref 145–400)
RBC: 3.38 MIL/uL — AB (ref 3.70–5.45)
RDW: 18.2 % — ABNORMAL HIGH (ref 11.2–14.5)
WBC Count: 5.1 10*3/uL (ref 3.9–10.3)

## 2018-02-21 LAB — CMP (CANCER CENTER ONLY)
ALBUMIN: 3.1 g/dL — AB (ref 3.5–5.0)
ALK PHOS: 78 U/L (ref 38–126)
ALT: 14 U/L (ref 0–44)
AST: 16 U/L (ref 15–41)
Anion gap: 9 (ref 5–15)
BILIRUBIN TOTAL: 0.4 mg/dL (ref 0.3–1.2)
BUN: 20 mg/dL (ref 8–23)
CO2: 32 mmol/L (ref 22–32)
CREATININE: 0.83 mg/dL (ref 0.44–1.00)
Calcium: 9.7 mg/dL (ref 8.9–10.3)
Chloride: 101 mmol/L (ref 98–111)
GFR, Est AFR Am: >60 mL/min (ref >60)
GFR, Estimated: >60 mL/min (ref >60)
GLUCOSE: 96 mg/dL (ref 70–99)
Potassium: 3.9 mmol/L (ref 3.5–5.1)
Sodium: 142 mmol/L (ref 135–145)
TOTAL PROTEIN: 7.2 g/dL (ref 6.5–8.1)

## 2018-02-21 MED ORDER — SODIUM CHLORIDE 0.9% FLUSH
10.0000 mL | INTRAVENOUS | Status: DC | PRN
  Administered 2018-02-21: 10 mL
  Filled 2018-02-21: qty 10

## 2018-02-21 MED ORDER — SODIUM CHLORIDE 0.9 % IJ SOLN
10.0000 mL | INTRAMUSCULAR | Status: DC | PRN
  Administered 2018-02-21: 10 mL via INTRAVENOUS
  Filled 2018-02-21: qty 10

## 2018-02-21 MED ORDER — SODIUM CHLORIDE 0.9 % IV SOLN
640.0000 mg/m2 | Freq: Once | INTRAVENOUS | Status: AC
  Administered 2018-02-21: 1178 mg via INTRAVENOUS
  Filled 2018-02-21: qty 30.98

## 2018-02-21 MED ORDER — PROCHLORPERAZINE MALEATE 10 MG PO TABS
ORAL_TABLET | ORAL | Status: AC
  Filled 2018-02-21: qty 1

## 2018-02-21 MED ORDER — SODIUM CHLORIDE 0.9 % IV SOLN
Freq: Once | INTRAVENOUS | Status: AC
  Administered 2018-02-21: 13:00:00 via INTRAVENOUS
  Filled 2018-02-21: qty 250

## 2018-02-21 MED ORDER — HEPARIN SOD (PORK) LOCK FLUSH 100 UNIT/ML IV SOLN
500.0000 [IU] | Freq: Once | INTRAVENOUS | Status: AC | PRN
  Administered 2018-02-21: 500 [IU]
  Filled 2018-02-21: qty 5

## 2018-02-21 MED ORDER — PROCHLORPERAZINE MALEATE 10 MG PO TABS
10.0000 mg | ORAL_TABLET | Freq: Once | ORAL | Status: AC
  Administered 2018-02-21: 10 mg via ORAL

## 2018-02-21 NOTE — Patient Instructions (Signed)
[facility] Discharge Instructions for Patients Receiving Chemotherapy  Today you received the following chemotherapy agents gemzar.  To help prevent nausea and vomiting after your treatment, we encourage you to take your nausea medication as directed.   If you develop nausea and vomiting that is not controlled by your nausea medication, call the clinic.   BELOW ARE SYMPTOMS THAT SHOULD BE REPORTED IMMEDIATELY:  *FEVER GREATER THAN 100.5 F  *CHILLS WITH OR WITHOUT FEVER  NAUSEA AND VOMITING THAT IS NOT CONTROLLED WITH YOUR NAUSEA MEDICATION  *UNUSUAL SHORTNESS OF BREATH  *UNUSUAL BRUISING OR BLEEDING  TENDERNESS IN MOUTH AND THROAT WITH OR WITHOUT PRESENCE OF ULCERS  *URINARY PROBLEMS  *BOWEL PROBLEMS  UNUSUAL RASH Items with * indicate a potential emergency and should be followed up as soon as possible.  Feel free to call the clinic should you have any questions or concerns. The clinic phone number is [phone].  Please show the CHEMO ALERT CARD at check-in to the Emergency Department and triage nurse.   

## 2018-02-21 NOTE — Telephone Encounter (Signed)
Gave patient avs and calendar of upcoming appts.  °

## 2018-02-21 NOTE — Progress Notes (Signed)
[facility] OFFICE PROGRESS NOTE   Diagnosis: Fallopian tube carcinoma  INTERVAL HISTORY:   Lynn Ramirez returns for a scheduled visit.  She completed another cycle of gemcitabine beginning 01/30/2018.  She tolerated the chemotherapy well.  She has erythema at 1 of the left fingernails.  She had increased dyspnea yesterday.  This has improved today.  Objective:  Vital signs in last 24 hours:  Blood pressure (!) 140/50, pulse 80, temperature 97.6 F (36.4 C), temperature source Oral, resp. rate 20, height _0  (1.549 m), weight 171 lb 12.8 oz (77.9 kg), SpO2 95 %.    HEENT: No thrush or ulcers Resp: Distant breath sounds, slight decrease in breath sounds at the left compared to the right chest, no respiratory distress Cardio: Regular rate and rhythm GI: No mass, nondistended Vascular: No leg edema   Portacath/PICC-without erythema  Lab Results:  Lab Results  Component Value Date   WBC 5.1 02/21/2018   HGB 10.5 (L) 02/21/2018   HCT 33.9 (L) 02/21/2018   MCV 100.3 02/21/2018   PLT 323 02/21/2018   NEUTROABS 3.4 02/21/2018    CMP  Lab Results  Component Value Date   NA 141 02/06/2018   K 3.9 02/06/2018   CL 101 02/06/2018   CO2 33 (H) 02/06/2018   GLUCOSE 104 (H) 02/06/2018   BUN 24 (H) 02/06/2018   CREATININE 0.81 02/06/2018   CALCIUM 9.8 02/06/2018   PROT 7.5 02/06/2018   ALBUMIN 3.3 (L) 02/06/2018   AST 16 02/06/2018   ALT 20 02/06/2018   ALKPHOS 79 02/06/2018   BILITOT 0.4 02/06/2018   GFRNONAA >60 02/06/2018   GFRAA >60 02/06/2018   Ca125 on 01/30/2018: 122 Medications: I have reviewed the patient's current medications.   Assessment/Plan:  1. 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 Significant improvement in malignant right pleural effusion.  Status post robotic-assisted laparoscopic hysterectomy with bilateral salpingoophorectomy, omentectomy, radical tumor debulking 12/29/2014. Per Dr. Serita Grit office note 01/14/2015 cytoreduction was optimal with residual disease remaining only in the 1 mm implants on the small intestine. 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  MSI-stable, mutation burden-4, BRAF rearrangement,ERBB4, KRAS G12D Cycle 1 adjuvant Taxol/carboplatin 01/20/2015 Cycle 2 adjuvant Taxol/carboplatin 02/10/2015 Cycle 3 adjuvant Taxol/carboplatin 03/10/2015 CA 125 on 1013 2016-42  CTs of the chest, abdomen, and pelvis 05/31/2015 with no evidence of progressive ovarian cancer  CTs of the chest, abdomen, and pelvis 08/07/2015 and 08/08/2015-no evidence of progressive ovarian cancer  Peritoneal studding noted at the time of the cholecystectomy procedure  08/09/2015 Elevated CA 125 10/07/2015  CT 10/07/2015 with stricturing at the sigmoid colon, constipation, and omental nodularity  Initiation of salvage weekly Taxol chemotherapy 10/13/2015  Taxol changed to every 2 weeks beginning 01/19/2016  due to painful neuropathy.  CT scans 07/19/2016 with no acute findings. No features in the abdomen or pelvis to suggest recurrent disease.Stable mild fullness right intrarenal collecting system.  Elevated CA 125 treatment resumed with Taxol/Avastin on a 2 week schedule 09/27/2016  CT 02/12/2017-no evidence of carcinomatosis, no evidence of progressive metastatic disease  Taxol/Avastin continued every 2 weeks  CT 09/10/2017-increase in trace ascites, no evidence of progressive carcinomatosis, inflammatory changes at the lung bases with a new 3 mm right lower lobe nodule  Taxol/Avastin continued every 2 weeks  Progressive rise in the CA 125  Cycle 1 carboplatin 3-week schedule4/17/2019  Cycle 2 carboplatin 11/14/2017  Progressive malignant left pleural effusion June 2019  CT 01/07/2018- resolution of pulmonary nodule seen on prior examination. New small bilateral pleural effusions left greater than right with areas of atelectasis. Stable scattered mediastinal and hilar lymph nodes some partially calcified. Stable emphysematous changes and areas of pulmonary scarring. Small volume abdominal ascites but no omental or peritoneal surface disease. Small scattered mesenteric and retroperitoneal lymph nodes stable. Stable nodularity left adrenal gland.  Cycle 1 gemcitabine 01/09/2018 (day 1/day 8 schedule)  Cycle 2 gemcitabine 01/30/2018  Cycle 3 gemcitabine 02/21/2018 2. COPD-followed by Dr. Lamonte Sakai. 3. Dyspnea secondary to COPD and the large right pleural effusion, status post therapeutic thoracentesis procedures 09/30/2014,10/09/2014, and 10/21/2014. Left thoracentesis 10/30/2014  Progressive left pleural effusion noted on chest x-ray  12/17/2017  Left thoracentesis 12/19/2017, cytology positive for metastatic adenocarcinoma 4. Left nephrectomy as a child 5. Delayed nausea following cycle 1 Taxol/carboplatin, Aloxi/Emend added with cycle 2 with improvement. 6. Right lower extremity edema, right calf pain 12/09/2014. Negative venous Doppler 12/09/2014. 7. Diffuse pruritus following cycle 1 adjuvant Taxol/carboplatin 01/20/2015, no rash, resolved with a steroid dose pack 8. Thrombocytopenia second to chemotherapy, the carboplatin was dose reduced with cycle 2 adjuvant Taxol/carboplatin 02/10/2015 9. Chemotherapy-induced peripheral neuropathy-painful peripheral neuropathy involving the feet 01/19/2016.  10. Admission with acute cholecystitis 08/07/2015, status post a cholecystectomy 08/09/2015 11. Admission 10/08/2015 with abdominal pain/constipation, improved with bowel rest and laxatives 12. Mild right hydronephrosis noted on the CT 10/07/2015. Renal ultrasound 12/16/2015 with no hydronephrosis noted.No hydronephrosis on CT 07/19/2016. 13. Anemia secondary to chronic disease and chemotherapy, status post a red cell transfusion 12/05/2017   Disposition: Lynn Ramirez appears stable.  She is tolerating gemcitabine well.  The CA 125 was lower on 01/30/2018.  We will follow-up on the CA 125 from today.  She will complete cycle 3 gemcitabine beginning today.  She will return for an office visit in the next cycle of chemotherapy on 03/13/2018.  Lynn Ramirez will contact us for increased dyspnea.  Betsy Coder, MD  02/21/2018  10:45 AM

## 2018-02-22 ENCOUNTER — Other Ambulatory Visit: Payer: Self-pay

## 2018-02-22 ENCOUNTER — Encounter (HOSPITAL_COMMUNITY): Payer: Self-pay

## 2018-02-22 ENCOUNTER — Emergency Department (HOSPITAL_COMMUNITY): Payer: Medicare Other

## 2018-02-22 ENCOUNTER — Telehealth: Payer: Self-pay

## 2018-02-22 ENCOUNTER — Inpatient Hospital Stay (HOSPITAL_COMMUNITY)
Admission: EM | Admit: 2018-02-22 | Discharge: 2018-02-23 | DRG: 190 | Disposition: A | Discharge status: Home / Self Care | Payer: Medicare Other | Attending: Internal Medicine | Admitting: Internal Medicine

## 2018-02-22 DIAGNOSIS — C786 Secondary malignant neoplasm of retroperitoneum and peritoneum: Secondary | ICD-10-CM | POA: Diagnosis not present

## 2018-02-22 DIAGNOSIS — Z9049 Acquired absence of other specified parts of digestive tract: Secondary | ICD-10-CM

## 2018-02-22 DIAGNOSIS — K219 Gastro-esophageal reflux disease without esophagitis: Secondary | ICD-10-CM | POA: Diagnosis not present

## 2018-02-22 DIAGNOSIS — C561 Malignant neoplasm of right ovary: Secondary | ICD-10-CM | POA: Diagnosis present

## 2018-02-22 DIAGNOSIS — C799 Secondary malignant neoplasm of unspecified site: Secondary | ICD-10-CM | POA: Diagnosis present

## 2018-02-22 DIAGNOSIS — G620 Drug-induced polyneuropathy: Secondary | ICD-10-CM | POA: Diagnosis not present

## 2018-02-22 DIAGNOSIS — Z91040 Latex allergy status: Secondary | ICD-10-CM | POA: Diagnosis not present

## 2018-02-22 DIAGNOSIS — Z87891 Personal history of nicotine dependence: Secondary | ICD-10-CM | POA: Diagnosis not present

## 2018-02-22 DIAGNOSIS — T451X5A Adverse effect of antineoplastic and immunosuppressive drugs, initial encounter: Secondary | ICD-10-CM | POA: Diagnosis not present

## 2018-02-22 DIAGNOSIS — J449 Chronic obstructive pulmonary disease, unspecified: Secondary | ICD-10-CM | POA: Diagnosis present

## 2018-02-22 DIAGNOSIS — J910 Malignant pleural effusion: Secondary | ICD-10-CM | POA: Diagnosis not present

## 2018-02-22 DIAGNOSIS — C5702 Malignant neoplasm of left fallopian tube: Secondary | ICD-10-CM | POA: Diagnosis not present

## 2018-02-22 DIAGNOSIS — Z905 Acquired absence of kidney: Secondary | ICD-10-CM | POA: Diagnosis not present

## 2018-02-22 DIAGNOSIS — J9621 Acute and chronic respiratory failure with hypoxia: Secondary | ICD-10-CM | POA: Diagnosis not present

## 2018-02-22 DIAGNOSIS — I429 Cardiomyopathy, unspecified: Secondary | ICD-10-CM | POA: Diagnosis not present

## 2018-02-22 DIAGNOSIS — N182 Chronic kidney disease, stage 2 (mild): Secondary | ICD-10-CM | POA: Diagnosis not present

## 2018-02-22 DIAGNOSIS — J90 Pleural effusion, not elsewhere classified: Secondary | ICD-10-CM

## 2018-02-22 DIAGNOSIS — G4700 Insomnia, unspecified: Secondary | ICD-10-CM | POA: Diagnosis not present

## 2018-02-22 DIAGNOSIS — E876 Hypokalemia: Secondary | ICD-10-CM

## 2018-02-22 DIAGNOSIS — Z9221 Personal history of antineoplastic chemotherapy: Secondary | ICD-10-CM | POA: Diagnosis not present

## 2018-02-22 DIAGNOSIS — Z9981 Dependence on supplemental oxygen: Secondary | ICD-10-CM

## 2018-02-22 DIAGNOSIS — C569 Malignant neoplasm of unspecified ovary: Secondary | ICD-10-CM | POA: Diagnosis not present

## 2018-02-22 DIAGNOSIS — Z79899 Other long term (current) drug therapy: Secondary | ICD-10-CM

## 2018-02-22 DIAGNOSIS — I129 Hypertensive chronic kidney disease with stage 1 through stage 4 chronic kidney disease, or unspecified chronic kidney disease: Secondary | ICD-10-CM | POA: Diagnosis not present

## 2018-02-22 DIAGNOSIS — J441 Chronic obstructive pulmonary disease with (acute) exacerbation: Principal | ICD-10-CM | POA: Diagnosis present

## 2018-02-22 DIAGNOSIS — C562 Malignant neoplasm of left ovary: Secondary | ICD-10-CM | POA: Diagnosis not present

## 2018-02-22 DIAGNOSIS — R0602 Shortness of breath: Secondary | ICD-10-CM

## 2018-02-22 DIAGNOSIS — E785 Hyperlipidemia, unspecified: Secondary | ICD-10-CM | POA: Diagnosis present

## 2018-02-22 DIAGNOSIS — D649 Anemia, unspecified: Secondary | ICD-10-CM | POA: Diagnosis not present

## 2018-02-22 DIAGNOSIS — Z880 Allergy status to penicillin: Secondary | ICD-10-CM | POA: Diagnosis not present

## 2018-02-22 LAB — CA 125: Cancer Antigen (CA) 125: 137 U/mL — ABNORMAL HIGH (ref 0.0–38.1)

## 2018-02-22 LAB — CBC
HCT: 32.8 % — ABNORMAL LOW (ref 36.0–46.0)
HEMOGLOBIN: 10.3 g/dL — AB (ref 12.0–15.0)
MCH: 31.3 pg (ref 26.0–34.0)
MCHC: 31.4 g/dL (ref 30.0–36.0)
MCV: 99.7 fL (ref 78.0–100.0)
PLATELETS: 381 10*3/uL (ref 150–400)
RBC: 3.29 MIL/uL — ABNORMAL LOW (ref 3.87–5.11)
RDW: 18 % — AB (ref 11.5–15.5)
WBC: 4.8 10*3/uL (ref 4.0–10.5)

## 2018-02-22 LAB — BASIC METABOLIC PANEL
ANION GAP: 11 (ref 5–15)
BUN: 22 mg/dL (ref 8–23)
CALCIUM: 9 mg/dL (ref 8.9–10.3)
CO2: 30 mmol/L (ref 22–32)
CREATININE: 0.88 mg/dL (ref 0.44–1.00)
Chloride: 99 mmol/L (ref 98–111)
GFR calc Af Amer: >60 mL/min (ref >60)
GLUCOSE: 138 mg/dL — AB (ref 70–99)
Potassium: 3.4 mmol/L — ABNORMAL LOW (ref 3.5–5.1)
Sodium: 140 mmol/L (ref 135–145)

## 2018-02-22 LAB — PROTIME-INR
INR: 1.04
Prothrombin Time: 13.5 seconds (ref 11.4–15.2)

## 2018-02-22 LAB — I-STAT TROPONIN, ED: TROPONIN I, POC: 0 ng/mL (ref 0.00–0.08)

## 2018-02-22 LAB — D-DIMER, QUANTITATIVE: D-Dimer, Quant: 2.67 ug/mL-FEU — ABNORMAL HIGH (ref 0.00–0.50)

## 2018-02-22 MED ORDER — ONDANSETRON 4 MG PO TBDP: 4.0000 mg | ORAL_TABLET | Freq: Three times a day (TID) | ORAL | Status: DC | PRN

## 2018-02-22 MED ORDER — ZOLPIDEM TARTRATE 5 MG PO TABS
5.0000 mg | ORAL_TABLET | Freq: Every evening | ORAL | Status: DC | PRN
Start: 2018-02-22 — End: 2018-02-23

## 2018-02-22 MED ORDER — VITAMIN B-6 100 MG PO TABS
100.0000 mg | ORAL_TABLET | Freq: Every day | ORAL | Status: DC
  Administered 2018-02-23: 100 mg via ORAL
  Filled 2018-02-22: qty 1

## 2018-02-22 MED ORDER — CARVEDILOL 3.125 MG PO TABS
3.1250 mg | ORAL_TABLET | Freq: Two times a day (BID) | ORAL | Status: DC
  Administered 2018-02-23: 3.125 mg via ORAL
  Filled 2018-02-22: qty 1

## 2018-02-22 MED ORDER — HYDROXYZINE HCL 25 MG PO TABS: 25.0000 mg | ORAL_TABLET | Freq: Four times a day (QID) | ORAL | Status: DC | PRN

## 2018-02-22 MED ORDER — IPRATROPIUM-ALBUTEROL 0.5-2.5 (3) MG/3ML IN SOLN
3.0000 mL | Freq: Once | RESPIRATORY_TRACT | Status: AC
  Administered 2018-02-22: 3 mL via RESPIRATORY_TRACT
  Filled 2018-02-22: qty 3

## 2018-02-22 MED ORDER — IPRATROPIUM-ALBUTEROL 0.5-2.5 (3) MG/3ML IN SOLN
3.0000 mL | Freq: Four times a day (QID) | RESPIRATORY_TRACT | Status: DC
  Administered 2018-02-22: 3 mL via RESPIRATORY_TRACT

## 2018-02-22 MED ORDER — IPRATROPIUM-ALBUTEROL 0.5-2.5 (3) MG/3ML IN SOLN
3.0000 mL | Freq: Once | RESPIRATORY_TRACT | Status: AC
  Administered 2018-02-22: 3 mL via RESPIRATORY_TRACT

## 2018-02-22 MED ORDER — FUROSEMIDE 40 MG PO TABS
40.0000 mg | ORAL_TABLET | Freq: Every day | ORAL | Status: DC
  Administered 2018-02-23: 40 mg via ORAL
  Filled 2018-02-22: qty 1

## 2018-02-22 MED ORDER — VITAMIN B-12 100 MCG PO TABS
100.0000 ug | ORAL_TABLET | Freq: Every day | ORAL | Status: DC
  Administered 2018-02-23: 100 ug via ORAL
  Filled 2018-02-22: qty 1

## 2018-02-22 MED ORDER — BIOTIN 2500 MCG PO CAPS: 1.0000 | ORAL_CAPSULE | Freq: Every day | ORAL | Status: DC

## 2018-02-22 MED ORDER — TRAMADOL HCL 50 MG PO TABS: 50.0000 mg | ORAL_TABLET | Freq: Two times a day (BID) | ORAL | Status: DC | PRN

## 2018-02-22 MED ORDER — BIOTENE DRY MOUTH MT LIQD
15.0000 mL | Freq: Three times a day (TID) | OROMUCOSAL | Status: DC
  Administered 2018-02-22: 15 mL via OROMUCOSAL

## 2018-02-22 MED ORDER — LORATADINE 10 MG PO TABS
10.0000 mg | ORAL_TABLET | Freq: Every day | ORAL | Status: DC
  Administered 2018-02-23: 10 mg via ORAL
  Filled 2018-02-22: qty 1

## 2018-02-22 MED ORDER — OXYCODONE-ACETAMINOPHEN 5-325 MG PO TABS
1.0000 | ORAL_TABLET | Freq: Four times a day (QID) | ORAL | Status: DC | PRN
Start: 2018-02-22 — End: 2018-02-23
  Administered 2018-02-23: 1 via ORAL
  Filled 2018-02-22: qty 1

## 2018-02-22 MED ORDER — IPRATROPIUM-ALBUTEROL 0.5-2.5 (3) MG/3ML IN SOLN
RESPIRATORY_TRACT | Status: AC
  Administered 2018-02-22: 3 mL via RESPIRATORY_TRACT
  Filled 2018-02-22: qty 3

## 2018-02-22 MED ORDER — DIPHENOXYLATE-ATROPINE 2.5-0.025 MG PO TABS: 2.0000 | ORAL_TABLET | Freq: Four times a day (QID) | ORAL | Status: DC | PRN

## 2018-02-22 MED ORDER — TIOTROPIUM BROMIDE MONOHYDRATE 18 MCG IN CAPS: 18.0000 ug | ORAL_CAPSULE | Freq: Every day | RESPIRATORY_TRACT | Status: DC

## 2018-02-22 MED ORDER — PREDNISONE 20 MG PO TABS
40.0000 mg | ORAL_TABLET | Freq: Every day | ORAL | Status: DC
  Administered 2018-02-23: 40 mg via ORAL
  Filled 2018-02-22: qty 2

## 2018-02-22 MED ORDER — VITAMIN D3 25 MCG (1000 UNIT) PO TABS
1000.0000 [IU] | ORAL_TABLET | Freq: Every day | ORAL | Status: DC
  Administered 2018-02-23: 1000 [IU] via ORAL
  Filled 2018-02-22: qty 1

## 2018-02-22 MED ORDER — ENOXAPARIN SODIUM 40 MG/0.4ML ~~LOC~~ SOLN: 40.0000 mg | Freq: Every day | SUBCUTANEOUS | Status: DC

## 2018-02-22 MED ORDER — FLUTICASONE PROPIONATE 50 MCG/ACT NA SUSP
1.0000 | Freq: Every morning | NASAL | Status: DC
  Administered 2018-02-23: 1 via NASAL
  Filled 2018-02-22: qty 16

## 2018-02-22 MED ORDER — GABAPENTIN 300 MG PO CAPS
300.0000 mg | ORAL_CAPSULE | Freq: Every day | ORAL | Status: DC
  Administered 2018-02-22: 300 mg via ORAL
  Filled 2018-02-22: qty 1

## 2018-02-22 NOTE — Telephone Encounter (Signed)
Returned call to pt regarding SOB. Pt voiced that "yesterday around 6pm my breathing got worst". Pt states that she took a nebulizer treatment then and again when she woke up this am and it got better and then this afternoon her breathing declined again. Pt audibly experiencing dyspnea during phone conversation. This RN instructed pt to go to nearest ED to be evaluated. Pt voiced understanding. MD made aware.

## 2018-02-22 NOTE — ED Notes (Signed)
Pt returned from X-ray.  

## 2018-02-22 NOTE — ED Triage Notes (Signed)
Pt is Aox4 and ambulatory with walker. Pt arrived via POV. Pt has cancer and is going through treatment. Pt has hx of pleural effusion. Pt has had SOB since yesterday around 1800 and has been getting worse even after Neb treatment.

## 2018-02-22 NOTE — ED Notes (Signed)
ED TO INPATIENT HANDOFF REPORT  Name/Age/Gender Lynn Ramirez 77 y.o. female  Code Status Code Status History    Date Active Date Inactive Code Status Order ID Comments User Context   10/08/2015 2012 10/11/2015 1733 Full Code [id number]  Orson Eva, MD Inpatient   08/08/2015 0027 08/11/2015 2100 Full Code [id number]  Reubin Milan, MD Inpatient   12/29/2014 2021 12/30/2014 1608 Full Code [id number]  Lahoma Crocker, MD Inpatient   11/02/2014 1258 11/03/2014 0353 Full Code [id number]  Marybelle Killings, MD [facility]    Advance Directive Documentation     Most Recent Value  Type of Advance Directive  Healthcare Power of Attorney, Living will  Pre-existing out of facility DNR order (yellow form or pink MOST form)  -  "MOST" Form in Place?  -      Home/SNF/Other Home  Chief Complaint Ca pt ( blue card), SOB  Level of Care/Admitting Diagnosis ED Disposition    ED Disposition Condition [place]: [place] [[id number]]  Level of Care: Med-Surg [16]  Diagnosis: COPD exacerbation [facility]) [[id number]]  Admitting Physician: Eston Esters  Attending Physician: Eston Esters  Estimated length of stay: past midnight tomorrow  Certification:: I certify this patient will need inpatient services for at least 2 midnights  PT Class (Do Not Modify): Inpatient [101]  PT Acc Code (Do Not Modify): Private [1]       Medical History Past Medical History:  Diagnosis Date  . CKD (chronic kidney disease), stage II   . COPD (chronic obstructive pulmonary disease) ([place])   . Coronary artery calcification seen on CT scan   . Difficulty sleeping   . Eczema    hands  . Emphysema   . GERD (gastroesophageal reflux disease)   . H/O hydronephrosis   . History of transfusion    age 31  . Hyperlipidemia   . Hypertension   . Ovarian cancer ([place]) dx'd 09/2014   metastatic - prior malignant R pleural effusion and peritoneal carcinomatosis  . S/p  nephrectomy     Allergies Allergies  Allergen Reactions  . Latex Rash    Latex gloves ONLY (pt cannot wear them- no reaction if someone else touches her with latex gloves on)  . Penicillins Other (See Comments)    welts Has patient had a PCN reaction causing immediate rash, facial/tongue/throat swelling, SOB or lightheadedness with hypotension: Yes  Has patient had a PCN reaction causing severe rash involving mucus membranes or skin necrosis:No Has patient had a PCN reaction that required hospitalization:No Has patient had a PCN reaction occurring within the last 10 years:No If all of the above answers are "NO", then may proceed with Cephalosporin use.     IV Location/Drains/Wounds Patient Lines/Drains/Airways Status   Active Line/Drains/Airways    Name:   Placement date:   Placement time:   Site:   Days:   Implanted Port 11/02/14 Right Chest   11/02/14    0900    Chest   1208          Labs/Imaging Results for orders placed or performed during the hospital encounter of 02/22/18 (from the past 48 hour(s))  Basic metabolic panel     Status: Abnormal   Collection Time: 02/22/18  4:50 PM  Result Value Ref Range   Sodium 140 135 - 145 mmol/L   Potassium 3.4 (L) 3.5 - 5.1 mmol/L   Chloride 99 98 - 111 mmol/L   CO2 30 22 -  32 mmol/L   Glucose, Bld 138 (H) 70 - 99 mg/dL   BUN 22 8 - 23 mg/dL   Creatinine, Ser 0.88 0.44 - 1.00 mg/dL   Calcium 9.0 8.9 - 10.3 mg/dL   GFR calc non Af Amer >60 >60 mL/min   GFR calc Af Amer >60 >60 mL/min    Comment: (NOTE) The eGFR has been calculated using the CKD EPI equation. This calculation has not been validated in all clinical situations. eGFR's persistently <60 mL/min signify possible Chronic Kidney Disease.    Anion gap 11 5 - 15    Comment: Performed at [facility], [place] [address], [place], [place] [address]  CBC     Status: Abnormal   Collection Time: 02/22/18  4:50 PM  Result Value Ref Range   WBC 4.8 4.0 -  10.5 K/uL   RBC 3.29 (L) 3.87 - 5.11 MIL/uL   Hemoglobin 10.3 (L) 12.0 - 15.0 g/dL   HCT 32.8 (L) 36.0 - 46.0 %   MCV 99.7 78.0 - 100.0 fL   MCH 31.3 26.0 - 34.0 pg   MCHC 31.4 30.0 - 36.0 g/dL   RDW 18.0 (H) 11.5 - 15.5 %   Platelets 381 150 - 400 K/uL    Comment: Performed at [facility], [place] [address], [place], [place] [address]  Protime-INR (order if Patient is taking Coumadin / Warfarin)     Status: None   Collection Time: 02/22/18  4:50 PM  Result Value Ref Range   Prothrombin Time 13.5 11.4 - 15.2 seconds   INR 1.04     Comment: Performed at [facility], [place] [address] [place], [place] [address]  D-dimer, quantitative (not at [facility])     Status: Abnormal   Collection Time: 02/22/18  5:21 PM  Result Value Ref Range   D-Dimer, Quant 2.67 (H) 0.00 - 0.50 ug/mL-FEU    Comment: (NOTE) At the manufacturer cut-off of 0.50 ug/mL FEU, this assay has been documented to exclude PE with a sensitivity and negative predictive value of 97 to 99%.  At this time, this assay has not been approved by the FDA to exclude DVT/VTE. Results should be correlated with clinical presentation. Performed at [facility], [place] [address], [place],  [address]   I-stat troponin, ED     Status: None   Collection Time: 02/22/18  5:28 PM  Result Value Ref Range   Troponin i, poc 0.00 0.00 - 0.08 ng/mL   Comment 3            Comment: Due to the release kinetics of cTnI, a negative result within the first hours of the onset of symptoms does not rule out myocardial infarction with certainty. If myocardial infarction is still suspected, repeat the test at appropriate intervals.    Dg Chest 2 View  Result Date: 02/22/2018 CLINICAL DATA:  Patient history of ovarian carcinoma. Shortness of breath. EXAM: CHEST - 2 VIEW COMPARISON:  Chest radiograph 12/31/2017 FINDINGS: Right anterior chest wall Port-A-Cath is present with tip projecting over the  superior vena cava. Monitoring leads overlie the patient. Stable cardiac and mediastinal contours. Aortic atherosclerosis. Small left pleural effusion with underlying opacities. No pneumothorax. Thoracic spine degenerative changes. Chronic deformity right humerus. IMPRESSION: Persistent small left pleural effusion with underlying opacities. Electronically Signed   By: Lovey Newcomer M.D.   On: 02/22/2018 19:14    Pending Labs [employer] (From admission, onward)    Start  Ordered   Signed and Held  CBC  (enoxaparin (LOVENOX)    CrCl >/= 30 ml/min)  Once,   R    Comments:  Baseline for enoxaparin therapy IF NOT ALREADY DRAWN.  Notify MD if PLT < 100 K.    Signed and Held   Signed and Held  Creatinine, serum  (enoxaparin (LOVENOX)    CrCl >/= 30 ml/min)  Once,   R    Comments:  Baseline for enoxaparin therapy IF NOT ALREADY DRAWN.    Signed and Held   Signed and Held  Creatinine, serum  (enoxaparin (LOVENOX)    CrCl >/= 30 ml/min)  Weekly,   R    Comments:  while on enoxaparin therapy    Signed and Held   Signed and Held  Basic metabolic panel  Tomorrow morning,   R     Signed and Held   Signed and Held  Magnesium  Tomorrow morning,   R     Signed and Held          Vitals/Pain Today's Vitals   02/22/18 1800 02/22/18 1827 02/22/18 1830 02/22/18 1932  BP: 133/67  126/61 (!) 131/58  Pulse: 91 88 87 88  Resp: _0 SpO2: 100% 100% 100% 100%  Weight:      Height:      PainSc:    0-No pain    Isolation Precautions No active isolations  Medications Medications  ipratropium-albuterol (DUONEB) 0.5-2.5 (3) MG/3ML nebulizer solution 3 mL (3 mLs Nebulization Given 02/22/18 1728)  ipratropium-albuterol (DUONEB) 0.5-2.5 (3) MG/3ML nebulizer solution 3 mL (3 mLs Nebulization Given 02/22/18 1749)  ipratropium-albuterol (DUONEB) 0.5-2.5 (3) MG/3ML nebulizer solution 3 mL (3 mLs Nebulization Given 02/22/18 1926)    Mobility walks

## 2018-02-22 NOTE — ED Notes (Signed)
Patient transported to X-ray 

## 2018-02-22 NOTE — H&P (Signed)
History and Physical    Lynn Ramirez DTO:[id number] DOB: August 20, 1940 DOA: 02/22/2018  PCP: Midge Minium, MD  Patient coming from: home   Chief Complaint: dyspnea  HPI: Lynn Ramirez is a 77 y.o. 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  Has had about 2 weeks of slightly worsening dyspnea. Yesterday after receiving chemotherapy (she thinks fourth cycle of gemcitabine, which started in early July) became more significantly dyspnic. Improved somewhat with home nebulizer. Awoke this morning more dyspnic. Called her oncologist, advised to present to ED, has has suffered from significant pleural effusion in the past.  Denies productive cough. Denies fever or chills. No nausea or vomiting. No hemoptysis. No new leg swelling.   ED Course: duonebs x3, labs, cxr  Review of Systems: As per HPI otherwise 10 point review of systems negative.    Past Medical History:  Diagnosis Date  . CKD (chronic kidney disease), stage II   . COPD (chronic obstructive pulmonary disease) ([place])   . Coronary artery calcification seen on CT scan   . Difficulty sleeping   . Eczema    hands  . Emphysema   . GERD (gastroesophageal reflux disease)   . H/O hydronephrosis   . History of transfusion    age 68  . Hyperlipidemia   . Hypertension   . Ovarian cancer ([place]) dx'd 09/2014   metastatic - prior malignant R pleural effusion and peritoneal carcinomatosis  . S/p nephrectomy     Past Surgical History:  Procedure Laterality Date  . ABDOMINAL HYSTERECTOMY    . CHOLECYSTECTOMY N/A 08/09/2015   Procedure: Attempted LAPAROSCOPIC coverted  open CHOLECYSTECTOMY WITH INTRAOPERATIVE CHOLANGIOGRAM;  Surgeon: Autumn Messing  III, MD;  Location: [facility];  Service: General;  Laterality: N/A;  . HAND SURGERY Right 1980's  . LAPAROTOMY N/A 12/29/2014   Procedure:  LAPAROTOMY;  Surgeon: Everitt Amber, MD;  Location: [facility];  Service: Gynecology;  Laterality: N/A;  . NEPHRECTOMY    . ROBOTIC ASSISTED TOTAL HYSTERECTOMY WITH BILATERAL SALPINGO OOPHERECTOMY Bilateral 12/29/2014   Procedure: ROBOTIC ASSISTED TOTAL LAPAROSCOPIC HYSTERECTOMY WITH BILATERAL SALPINGO OOPHORECTOMY AND OOMENTECTOMY WITH RADICAL TUMOR [place] ;  Surgeon: Everitt Amber, MD;  Location: [facility];  Service: Gynecology;  Laterality: Bilateral;  . THORACENTESIS     several  . TONSILLECTOMY    . TUBAL LIGATION       reports that she quit smoking about 4 years ago. Her smoking use included cigarettes. She has a 40.00 pack-year smoking history. She has never used smokeless tobacco. She reports that she drinks alcohol. She reports that she does not use drugs.  Allergies  Allergen Reactions  . Latex Rash    Latex gloves ONLY (pt cannot wear them- no reaction if someone else touches her with latex gloves on)  . Penicillins Other (See Comments)    welts Has patient had a PCN reaction causing immediate rash, facial/tongue/throat swelling, SOB or lightheadedness with hypotension: Yes  Has patient had a PCN reaction causing severe rash involving mucus membranes or skin necrosis:No Has patient had a PCN reaction that required hospitalization:No Has patient had a PCN reaction occurring within the last 10 years:No If all of the above answers are "NO", then may proceed with Cephalosporin use.     Family History  Problem Relation Age  of Onset  . Diabetes Father   . Lung cancer Father 66       metastasis to liver  . Cancer Father        liver  . Pancreatic cancer Mother 26  . Stroke Paternal Grandfather   . Heart disease Maternal Aunt   . Diabetes Paternal Uncle   . Heart Problems Maternal Grandmother   . Heart Problems Maternal Grandfather   . Diabetes  Paternal Grandmother   . Stroke Paternal Grandmother   . Heart Problems Paternal Uncle   . Cancer Cousin        unknown type  . Breast cancer Cousin        dx. 70s  . Leukemia Cousin        dx. 16-17  . Colon cancer Neg Hx   . Stomach cancer Neg Hx     Prior to Admission medications   Medication Sig Start Date End Date Taking? Authorizing Provider  Acetaminophen (TYLENOL) 325 MG CAPS Take 1 tablet by mouth 3 (three) times daily as needed (pain).    Yes [provider]  albuterol (PROVENTIL) (2.5 MG/3ML) 0.083% nebulizer solution Take 3 mLs (2.5 mg total) by nebulization every 6 (six) hours as needed for wheezing or shortness of breath. 12/25/16  Yes Collene Gobble, MD  antiseptic oral rinse (BIOTENE) LIQD 15 mLs by Mouth Rinse route 3 (three) times daily.   Yes [provider]  ARNUITY ELLIPTA 200 MCG/ACT AEPB INHALE 1 PUFF INTO THE LUNGS DAILY 09/11/17  Yes Collene Gobble, MD  atorvastatin (LIPITOR) 10 MG tablet TAKE 1 TABLET(10 MG) BY MOUTH DAILY 08/24/17  Yes Dorothy Spark, MD  Biotin 2500 MCG CAPS Take 1 tablet by mouth daily.   Yes [provider]  carvedilol (COREG) 3.125 MG tablet TAKE 1 TABLET(3.125 MG) BY MOUTH TWICE DAILY WITH A MEAL 11/27/17  Yes Dorothy Spark, MD  cholecalciferol (VITAMIN D) 1000 units tablet Take 1,000 Units by mouth daily.   Yes [provider]  diphenoxylate-atropine (LOMOTIL) 2.5-0.025 MG tablet TAKE 2 TABLETS BY MOUTH FOUR TIMES DAILY AS NEEDED FOR DIARRHEA/ LOOSE STOOLS 08/03/17  Yes Ladell Pier, MD  esomeprazole (NEXIUM) 40 MG capsule Take 1 capsule (40 mg total) by mouth daily. 07/16/17  Yes Collene Gobble, MD  fluocinonide cream (LIDEX) 2.42 % Apply 1 application topically 2 (two) times daily as needed (For ezcema).    Yes [provider]  furosemide (LASIX) 40 MG tablet Take 1 tablet (40 mg total) by mouth daily. 12/10/17  Yes Dorothy Spark, MD  gabapentin (NEURONTIN) 300 MG capsule TAKE 1  CAPSULE BY MOUTH EVERY NIGHT AT BEDTIME 12/17/17  Yes Ladell Pier, MD  hydrOXYzine (VISTARIL) 25 MG capsule Take 1 capsule (25 mg total) by mouth 4 (four) times daily as needed for itching. 01/03/18  Yes Ladell Pier, MD  lidocaine-prilocaine (EMLA) cream Apply to port site one hour prior to use. Do not rub in. Cover with plastic. 10/12/16  Yes Ladell Pier, MD  loratadine (CLARITIN) 10 MG tablet Take 10 mg by mouth daily.   Yes [provider]  ondansetron (ZOFRAN-ODT) 4 MG disintegrating tablet Take 1 tablet (4 mg total) by mouth every 8 (eight) hours as needed for nausea or vomiting. 12/05/17  Yes Ladell Pier, MD  oxyCODONE-acetaminophen (PERCOCET/ROXICET) 5-325 MG tablet Take 1 tablet by mouth every 6 (six) hours as needed for severe pain. 08/15/17  Yes Ladell Pier, MD  PROAIR HFA 108 (90 Base) MCG/ACT inhaler INHALE 2 PUFFS INTO THE LUNGS FOUR TIMES DAILY AS NEEDED FOR WHEEZING 03/13/17  Yes Byrum, Rose Fillers, MD  pyridoxine (B-6) 100 MG tablet Take 100 mg by mouth daily.   Yes [provider]  STIOLTO RESPIMAT 2.5-2.5 MCG/ACT AERS INHALE 2 PUFFS INTO THE LUNGS DAILY 09/11/17  Yes Byrum, Rose Fillers, MD  traMADol (ULTRAM) 50 MG tablet Take 1 tablet (50 mg total) by mouth every 12 (twelve) hours as needed. for pain 09/26/17  Yes Ladell Pier, MD  vitamin B-12 (CYANOCOBALAMIN) 100 MCG tablet Take 100 mcg by mouth daily. Reported on 01/19/2016   Yes [provider]  zolpidem (AMBIEN) 5 MG tablet Take 1 tablet (5 mg total) by mouth at bedtime as needed for sleep. 08/01/17  Yes [name], NP    Physical Exam: Vitals:   02/22/18 1800 02/22/18 1827 02/22/18 1830 02/22/18 1932  BP: 133/67  126/61 (!) 131/58  Pulse: 91 88 87 88  Resp: 18 19 18 20   SpO2: 100% 100% 100% 100%  Weight:      Height:        Constitutional: No acute distress Head: Atraumatic Eyes: Conjunctiva clear ENM: Moist mucous membranes. Normal dentition.  Neck: Supple Respiratory:  Clear to auscultation bilaterally save for mild rales left base, no wheeze or rhonchi, Normal respiratory effort. No accessory muscle use. . Cardiovascular: Regular rate and rhythm. No murmurs/rubs/gallops. Abdomen: Non-tender, non-distended. No masses. No rebound or guarding. Positive bowel sounds. Musculoskeletal: No joint deformity upper and lower extremities. Normal ROM, no contractures. Normal muscle tone.  Skin: No rashes, lesions, or ulcers.  Extremities: No peripheral edema. Palpable peripheral pulses. Neurologic: Alert, moving all 4 extremities. Psychiatric: Normal insight and judgement.  Access: right subclavian tunneled cath   Labs on Admission: I have personally reviewed following labs and imaging studies  CBC: Recent Labs  Lab 02/21/18 0938 02/22/18 1650  WBC 5.1 4.8  NEUTROABS 3.4  --   HGB 10.5* 10.3*  HCT 33.9* 32.8*  MCV 100.3 99.7  PLT 323 161   Basic Metabolic Panel: Recent Labs  Lab 02/21/18 0938 02/22/18 1650  NA 142 140  K 3.9 3.4*  CL 101 99  CO2 32 30  GLUCOSE 96 138*  BUN 20 22  CREATININE 0.83 0.88  CALCIUM 9.7 9.0   GFR: Estimated Creatinine Clearance: 52.9 mL/min (by C-G formula based on SCr of 0.88 mg/dL). Liver Function Tests: Recent Labs  Lab 02/21/18 0938  AST 16  ALT 14  ALKPHOS 78  BILITOT 0.4  PROT 7.2  ALBUMIN 3.1*   No results for input(s): LIPASE, AMYLASE in the last 168 hours. No results for input(s): AMMONIA in the last 168 hours. Coagulation Profile: Recent Labs  Lab 02/22/18 1650  INR 1.04   Cardiac Enzymes: No results for input(s): CKTOTAL, CKMB, CKMBINDEX, TROPONINI in the last 168 hours. BNP (last 3 results) Recent Labs    12/17/17 1643  PROBNP 24.0   HbA1C: No results for input(s): HGBA1C in the last 72 hours. CBG: No results for input(s): GLUCAP in the last 168 hours. Lipid Profile: No results for input(s): CHOL, HDL, LDLCALC, TRIG, CHOLHDL, LDLDIRECT in the last 72 hours. Thyroid Function  Tests: No results for input(s): TSH, T4TOTAL, FREET4, T3FREE, THYROIDAB in the last 72 hours. Anemia Panel: No results for input(s): VITAMINB12, FOLATE, FERRITIN, TIBC, IRON, RETICCTPCT in the last 72 hours. Urine analysis:    Component Value Date/Time   COLORURINE AMBER (A)  12/05/2017 1013   APPEARANCEUR CLOUDY (A) 12/05/2017 1013   LABSPEC 1.019 12/05/2017 1013   LABSPEC 1.015 01/03/2017 1110   PHURINE 5.0 12/05/2017 1013   GLUCOSEU NEGATIVE 12/05/2017 1013   GLUCOSEU Negative 01/03/2017 1110   HGBUR SMALL (A) 12/05/2017 1013   BILIRUBINUR SMALL (A) 12/05/2017 1013   BILIRUBINUR Negative 01/03/2017 1110   KETONESUR 5 (A) 12/05/2017 1013   PROTEINUR 100 (A) 12/05/2017 1013   UROBILINOGEN 0.2 01/03/2017 1110   NITRITE NEGATIVE 12/05/2017 1013   LEUKOCYTESUR LARGE (A) 12/05/2017 1013   LEUKOCYTESUR Negative 01/03/2017 1110    Radiological Exams on Admission: Dg Chest 2 View  Result Date: 02/22/2018 CLINICAL DATA:  Patient history of ovarian carcinoma. Shortness of breath. EXAM: CHEST - 2 VIEW COMPARISON:  Chest radiograph 12/31/2017 FINDINGS: Right anterior chest wall Port-A-Cath is present with tip projecting over the superior vena cava. Monitoring leads overlie the patient. Stable cardiac and mediastinal contours. Aortic atherosclerosis. Small left pleural effusion with underlying opacities. No pneumothorax. Thoracic spine degenerative changes. Chronic deformity right humerus. IMPRESSION: Persistent small left pleural effusion with underlying opacities. Electronically Signed   By: Lovey Newcomer M.D.   On: 02/22/2018 19:14    EKG: Independently reviewed. Nsr, ra deviation seen on prior  Assessment/Plan Active Problems:   COPD  GOLD C   Malignant pleural effusion   Metastatic adenocarcinoma (HCC)   Ovarian cancer ([facility])   COPD (chronic obstructive pulmonary disease) (HCC)   Cardiomyopathy (HCC)   COPD with acute exacerbation (HCC)   Normocytic anemia   Hypokalemia   On home  oxygen therapy   COPD exacerbation ([place])   # COPD exacerbation - mild. Not fully convinced this is COPD as denies wheezing or productive cough. CXR shows persistent mild left pleural effusion, so do not think this is 2/2 effusion. Has responded well to duonebs x3. Dyspnea is a common side effect of gemcitabine, which was started last month. No signs dvt. No new oxygen requirement. Admitted because very week when ED provider got patient up to walk around. - cont duonebs.  - start prednisone 40 mg po qd - appreciate oncology recs, particularly regarding possibility this is side effect of gemcitabine - given active cancer may be worth pursuing CTA. Will check d-dimer. - pt eval  # adenocarcinoma - oncology notified of admission - cont home hydrocodone/tramadol prn - cont home bowel regimen  # normocytic anemia - h 10.3, stable, likely 2/2 chemo and chronic disease - ctm  # hypokalemia - mild, 3.4 - repeat k in am, with mg  # neuropathy - 2/2 chemo - cont home gabapentin  # insomnia - cont home zolpidem    DVT prophylaxis: lovenox Code Status: full  Family Communication: husband bill Cassetta [phone]  Disposition Plan: tbd  Consults called: none  Admission status: med/surg    Desma Maxim MD [facility] Pager [phone]  If 7PM-7AM, please contact night-coverage [email or web address] Password [facility]  02/22/2018, 8:44 PM

## 2018-02-22 NOTE — ED Provider Notes (Signed)
[facility] Emergency Department Provider Note MRN:  [id number]  Arrival date & time: 02/23/18     Chief Complaint   Shortness of Breath   History of Present Illness   Lynn Ramirez is a 77 y.o. year-old female with a history of COPD, ovarian cancer with malignant pleural effusion presenting to the ED with chief complaint of shortness of breath.  Patient felt well yesterday and received chemotherapy at the infusion center.  1 to 2 hours after that time, she began expressing gradually worsening shortness of breath.  Improved with inhaler treatments last night.  Woke up this morning with return shortness of breath, this time with minimal improvement with inhalers.  Denies recent cough or fevers, no chest pain, no abdominal pain, no dysuria.  Review of Systems  A complete 10 system review of systems was obtained and all systems are negative except as noted in the HPI and PMH.   Patient's Health History    Past Medical History:  Diagnosis Date  . CKD (chronic kidney disease), stage II   . COPD (chronic obstructive pulmonary disease) ([place])   . Coronary artery calcification seen on CT scan   . Difficulty sleeping   . Eczema    hands  . Emphysema   . GERD (gastroesophageal reflux disease)   . H/O hydronephrosis   . History of transfusion    age 52  . Hyperlipidemia   . Hypertension   . Ovarian cancer ([place]) dx'd 09/2014   metastatic - prior malignant R pleural effusion and peritoneal carcinomatosis  . S/p nephrectomy     Past Surgical History:  Procedure Laterality Date  . ABDOMINAL HYSTERECTOMY    . CHOLECYSTECTOMY N/A 08/09/2015   Procedure: Attempted LAPAROSCOPIC coverted  open CHOLECYSTECTOMY WITH INTRAOPERATIVE CHOLANGIOGRAM;  Surgeon: Autumn Messing III, MD;  Location: [facility];  Service: General;  Laterality: N/A;  . HAND SURGERY Right 1980's  . LAPAROTOMY N/A 12/29/2014   Procedure:  LAPAROTOMY;  Surgeon: Everitt Amber, MD;  Location: [facility];  Service:  Gynecology;  Laterality: N/A;  . NEPHRECTOMY    . ROBOTIC ASSISTED TOTAL HYSTERECTOMY WITH BILATERAL SALPINGO OOPHERECTOMY Bilateral 12/29/2014   Procedure: ROBOTIC ASSISTED TOTAL LAPAROSCOPIC HYSTERECTOMY WITH BILATERAL SALPINGO OOPHORECTOMY AND OOMENTECTOMY WITH RADICAL TUMOR [place] ;  Surgeon: Everitt Amber, MD;  Location: [facility];  Service: Gynecology;  Laterality: Bilateral;  . THORACENTESIS     several  . TONSILLECTOMY    . TUBAL LIGATION      Family History  Problem Relation Age of Onset  . Diabetes Father   . Lung cancer Father 25       metastasis to liver  . Cancer Father        liver  . Pancreatic cancer Mother 34  . Stroke Paternal Grandfather   . Heart disease Maternal Aunt   . Diabetes Paternal Uncle   . Heart Problems Maternal Grandmother   . Heart Problems Maternal Grandfather   . Diabetes Paternal Grandmother   . Stroke Paternal Grandmother   . Heart Problems Paternal Uncle   . Cancer Cousin        unknown type  . Breast cancer Cousin        dx. 65s  . Leukemia Cousin        dx. 16-17  . Colon cancer Neg Hx   . Stomach cancer Neg Hx     Social History   Socioeconomic History  . Marital status: Married    Spouse name: Not on file  .  Number of children: 3  . Years of education: Not on file  . Highest education level: Not on file  Occupational History  . Occupation: RETIRED    Employer: RETIRED  Social Needs  . Financial resource strain: Not hard at all  . Food insecurity:    Worry: Never true    Inability: Never true  . Transportation needs:    Medical: No    Non-medical: No  Tobacco Use  . Smoking status: Former Smoker    Packs/day: 1.00    Years: 40.00    Pack years: 40.00    Types: Cigarettes    Last attempt to quit: 07/10/2013    Years since quitting: 4.6  . Smokeless tobacco: Never Used  Substance and Sexual Activity  . Alcohol use: Yes    Alcohol/week: 0.0 standard drinks    Comment: 1-2 a week  . Drug use: No  . Sexual activity:  Never  Lifestyle  . Physical activity:    Days per week: 0 days    Minutes per session: 0 min  . Stress: Only a little  Relationships  . Social connections:    Talks on phone: More than three times a week    Gets together: More than three times a week    Attends religious service: More than 4 times per year    Active member of club or organization: Yes    Attends meetings of clubs or organizations: More than 4 times per year    Relationship status: Married  . Intimate partner violence:    Fear of current or ex partner: Not on file    Emotionally abused: Not on file    Physically abused: Not on file    Forced sexual activity: Not on file  Other Topics Concern  . Not on file  Social History Narrative  . Not on file     Physical Exam  Vital Signs and Nursing Notes reviewed Vitals:   02/22/18 2206 02/22/18 2312  BP: 135/61   Pulse: 83   Resp: 18   Temp: 98.7 F (37.1 C)   SpO2: 100% 100%    CONSTITUTIONAL: Well-appearing, NAD NEURO:  Alert and oriented x 3, no focal deficits EYES:  eyes equal and reactive ENT/NECK:  no LAD, no JVD CARDIO: Regular rate, well-perfused, normal S1 and S2 PULM: Poor air movement, increased work of breathing, mildly tachypneic GI/GU:  normal bowel sounds, non-distended, non-tender MSK/SPINE:  No gross deformities, no edema SKIN:  no rash, atraumatic PSYCH:  Appropriate speech and behavior  Diagnostic and Interventional Summary    EKG Interpretation  Date/Time:    Ventricular Rate:    PR Interval:    QRS Duration:   QT Interval:    QTC Calculation:   R Axis:     Text Interpretation:        Labs Reviewed  BASIC METABOLIC PANEL - Abnormal; Notable for the following components:      Result Value   Potassium 3.4 (*)    Glucose, Bld 138 (*)    All other components within normal limits  CBC - Abnormal; Notable for the following components:   RBC 3.29 (*)    Hemoglobin 10.3 (*)    HCT 32.8 (*)    RDW 18.0 (*)    All other  components within normal limits  D-DIMER, QUANTITATIVE (NOT AT [facility]) - Abnormal; Notable for the following components:   D-Dimer, Quant 2.67 (*)    All other components within normal limits  PROTIME-INR  BASIC METABOLIC PANEL  MAGNESIUM  I-STAT TROPONIN, ED    DG Chest 2 View  Final Result      Medications  antiseptic oral rinse (BIOTENE) solution 15 mL (15 mLs Mouth Rinse Given 02/22/18 2235)  vitamin B-12 (CYANOCOBALAMIN) tablet 100 mcg (has no administration in time range)  cholecalciferol (VITAMIN D) tablet 1,000 Units (has no administration in time range)  loratadine (CLARITIN) tablet 10 mg (has no administration in time range)  zolpidem (AMBIEN) tablet 5 mg (has no administration in time range)  diphenoxylate-atropine (LOMOTIL) 2.5-0.025 MG per tablet 2 tablet (has no administration in time range)  oxyCODONE-acetaminophen (PERCOCET/ROXICET) 5-325 MG per tablet 1 tablet (has no administration in time range)  fluticasone (FLONASE) 50 MCG/ACT nasal spray 1 spray (has no administration in time range)  traMADol (ULTRAM) tablet 50 mg (has no administration in time range)  pyridOXINE (VITAMIN B-6) tablet 100 mg (has no administration in time range)  ondansetron (ZOFRAN-ODT) disintegrating tablet 4 mg (has no administration in time range)  furosemide (LASIX) tablet 40 mg (has no administration in time range)  gabapentin (NEURONTIN) capsule 300 mg (300 mg Oral Given 02/22/18 2234)  hydrOXYzine (ATARAX/VISTARIL) tablet 25 mg (has no administration in time range)  carvedilol (COREG) tablet 3.125 mg (has no administration in time range)  enoxaparin (LOVENOX) injection 40 mg (40 mg Subcutaneous Not Given 02/22/18 2235)  predniSONE (DELTASONE) tablet 40 mg (has no administration in time range)  albuterol (PROVENTIL) (2.5 MG/3ML) 0.083% nebulizer solution 2.5 mg (has no administration in time range)  ipratropium-albuterol (DUONEB) 0.5-2.5 (3) MG/3ML nebulizer solution 3 mL (has no administration  in time range)  ipratropium-albuterol (DUONEB) 0.5-2.5 (3) MG/3ML nebulizer solution 3 mL (3 mLs Nebulization Given 02/22/18 1728)  ipratropium-albuterol (DUONEB) 0.5-2.5 (3) MG/3ML nebulizer solution 3 mL (3 mLs Nebulization Given 02/22/18 1749)  ipratropium-albuterol (DUONEB) 0.5-2.5 (3) MG/3ML nebulizer solution 3 mL (3 mLs Nebulization Given 02/22/18 1926)     Procedures Critical Care  ED Course and Medical Decision Making  I have reviewed the triage vital signs and the nursing notes.  Pertinent labs & imaging results that were available during my care of the patient were reviewed by me and considered in my medical decision making (see below for details).    Gradual onset shortness of breath in a 77 year old female, known malignant pleural effusion.  Also with history of COPD question of exacerbation versus reaccumulation of effusion.  Will provide duo nebs, chest x-ray, consider admission versus thoracentesis here in the ED.  No chest pain, no evidence of DVT on exam, on her baseline 2 L nasal cannula.  Little to no concern for PE at this time.  Chest x-ray does not reveal considerable worsening of her effusion.  Patient is feeling a lot better after 2 DuoNebs in the ED but was very out of breath with minimal ambulation during x-ray.  Admitted to the hospital service for further care and evaluation given patient's continued functional status.  Barth Kirks. Sedonia Small, [place] [email or web address]  Final Clinical Impressions(s) / ED Diagnoses     ICD-10-CM   1. COPD exacerbation ([place]) J44.1   2. Shortness of breath R06.02   3. Pleural effusion J90     ED Discharge Orders    None         Maudie Flakes, MD 02/23/18 [phone]

## 2018-02-23 ENCOUNTER — Encounter (HOSPITAL_COMMUNITY): Payer: Self-pay | Admitting: *Deleted

## 2018-02-23 ENCOUNTER — Inpatient Hospital Stay (HOSPITAL_COMMUNITY): Payer: Medicare Other

## 2018-02-23 ENCOUNTER — Other Ambulatory Visit: Payer: Self-pay | Admitting: Emergency Medicine

## 2018-02-23 DIAGNOSIS — I429 Cardiomyopathy, unspecified: Secondary | ICD-10-CM | POA: Diagnosis not present

## 2018-02-23 DIAGNOSIS — J449 Chronic obstructive pulmonary disease, unspecified: Secondary | ICD-10-CM

## 2018-02-23 DIAGNOSIS — R7989 Other specified abnormal findings of blood chemistry: Secondary | ICD-10-CM | POA: Diagnosis not present

## 2018-02-23 DIAGNOSIS — Z9221 Personal history of antineoplastic chemotherapy: Secondary | ICD-10-CM

## 2018-02-23 DIAGNOSIS — J910 Malignant pleural effusion: Secondary | ICD-10-CM | POA: Diagnosis not present

## 2018-02-23 DIAGNOSIS — C562 Malignant neoplasm of left ovary: Secondary | ICD-10-CM | POA: Diagnosis not present

## 2018-02-23 DIAGNOSIS — J441 Chronic obstructive pulmonary disease with (acute) exacerbation: Principal | ICD-10-CM

## 2018-02-23 DIAGNOSIS — J9621 Acute and chronic respiratory failure with hypoxia: Secondary | ICD-10-CM | POA: Diagnosis not present

## 2018-02-23 DIAGNOSIS — I700 Atherosclerosis of aorta: Secondary | ICD-10-CM | POA: Diagnosis not present

## 2018-02-23 DIAGNOSIS — C5700 Malignant neoplasm of unspecified fallopian tube: Secondary | ICD-10-CM

## 2018-02-23 DIAGNOSIS — C799 Secondary malignant neoplasm of unspecified site: Secondary | ICD-10-CM | POA: Diagnosis not present

## 2018-02-23 DIAGNOSIS — C786 Secondary malignant neoplasm of retroperitoneum and peritoneum: Secondary | ICD-10-CM | POA: Diagnosis not present

## 2018-02-23 DIAGNOSIS — R0602 Shortness of breath: Secondary | ICD-10-CM | POA: Diagnosis not present

## 2018-02-23 LAB — BASIC METABOLIC PANEL
ANION GAP: 7 (ref 5–15)
BUN: 18 mg/dL (ref 8–23)
CHLORIDE: 101 mmol/L (ref 98–111)
CO2: 33 mmol/L — AB (ref 22–32)
CREATININE: 0.79 mg/dL (ref 0.44–1.00)
Calcium: 9.4 mg/dL (ref 8.9–10.3)
GFR calc Af Amer: >60 mL/min (ref >60)
GFR calc non Af Amer: >60 mL/min (ref >60)
Glucose, Bld: 115 mg/dL — ABNORMAL HIGH (ref 70–99)
POTASSIUM: 3.5 mmol/L (ref 3.5–5.1)
SODIUM: 141 mmol/L (ref 135–145)

## 2018-02-23 LAB — MAGNESIUM: MAGNESIUM: 1.4 mg/dL — AB (ref 1.7–2.4)

## 2018-02-23 MED ORDER — IOPAMIDOL (ISOVUE-370) INJECTION 76%
100.0000 mL | Freq: Once | INTRAVENOUS | Status: AC | PRN
  Administered 2018-02-23: 100 mL via INTRAVENOUS

## 2018-02-23 MED ORDER — ALBUTEROL SULFATE (2.5 MG/3ML) 0.083% IN NEBU: 2.5000 mg | INHALATION_SOLUTION | RESPIRATORY_TRACT | Status: DC | PRN

## 2018-02-23 MED ORDER — IPRATROPIUM-ALBUTEROL 0.5-2.5 (3) MG/3ML IN SOLN
3.0000 mL | Freq: Three times a day (TID) | RESPIRATORY_TRACT | Status: DC
  Administered 2018-02-23: 3 mL via RESPIRATORY_TRACT
  Filled 2018-02-23: qty 3

## 2018-02-23 MED ORDER — DOXYCYCLINE HYCLATE 100 MG PO TABS
100.0000 mg | ORAL_TABLET | Freq: Two times a day (BID) | ORAL | Status: DC
  Administered 2018-02-23: 100 mg via ORAL
  Filled 2018-02-23: qty 1

## 2018-02-23 MED ORDER — IOPAMIDOL (ISOVUE-370) INJECTION 76%
INTRAVENOUS | Status: AC
  Filled 2018-02-23: qty 100

## 2018-02-23 MED ORDER — MOMETASONE FURO-FORMOTEROL FUM 100-5 MCG/ACT IN AERO
2.0000 | INHALATION_SPRAY | Freq: Two times a day (BID) | RESPIRATORY_TRACT | Status: DC
  Filled 2018-02-23: qty 8.8

## 2018-02-23 MED ORDER — PREDNISONE 20 MG PO TABS: 40.0000 mg | ORAL_TABLET | Freq: Every day | ORAL | 0 refills | Status: AC

## 2018-02-23 MED ORDER — HEPARIN SOD (PORK) LOCK FLUSH 100 UNIT/ML IV SOLN
500.0000 [IU] | INTRAVENOUS | Status: AC | PRN
  Administered 2018-02-23: 500 [IU]

## 2018-02-23 MED ORDER — DOXYCYCLINE HYCLATE 100 MG PO TABS: 100.0000 mg | ORAL_TABLET | Freq: Two times a day (BID) | ORAL | 0 refills | Status: AC

## 2018-02-23 NOTE — Care Management Note (Signed)
Case Management Note  Patient Details  Name: Lynn Ramirez MRN: [id number] Date of Birth: 1940-09-08  Subjective/Objective:  COPD, weakness                  Action/Plan: Outpatient PT arranged at [place] on [address] per pt request.   Expected Discharge Date:  02/23/18               Expected Discharge Plan:  OP Rehab  In-House Referral:  NA  Discharge planning Services  CM Consult  Post Acute Care Choice:  NA Choice offered to:  NA  DME Arranged:  N/A DME Agency:  NA  HH Arranged:  NA HH Agency:  NA  Status of Service:  Completed, signed off  If discussed at [place] of Stay Meetings, dates discussed:    Additional Comments:  Erenest Rasher, RN 02/23/2018, 3:31 PM

## 2018-02-23 NOTE — Progress Notes (Addendum)
Contacted UR for potential Code 44, pt dc prior to review complete. Jonnie Finner RN CCM Case Mgmt phone [phone]

## 2018-02-23 NOTE — Progress Notes (Addendum)
Patient discharged to home, all discharged medications and instructions reviewed and questions answered. Paper prescriptions provided to patient. Patient elects to go to [facility] outpatient rehab center, case manager notified.  Patient to be assisted to vehicle by wheelchair.

## 2018-02-23 NOTE — Progress Notes (Signed)
IP PROGRESS NOTE  Subjective:   Lynn Ramirez is well-known to me with a history of advanced stage Fallopian tube carcinoma.  She is currently being treated with single agent gemcitabine, last given on 02/21/2018.  She developed increased dyspnea on the evening of 02/21/2018 and again on 02/22/2018.  She presented to the emergency room for further evaluation.  No fever.  No change in her baseline cough. She reports feeling better today.  Objective:  agentVital signs in last 24 hours: Blood pressure 137/60, pulse 84, temperature 98.3 F (36.8 C), temperature source Oral, resp. rate 20, height '5\' 1"'$  (1.549 m), weight 171 lb 15.3 oz (78 kg), SpO2 99 %.  Intake/Output from previous day: 08/16 0701 - 08/17 0700 In: 480 [P.O.:480] Out: -   Physical Exam:  HEENT: No thrush Lungs: Distant breath sounds, clear bilaterally, no respiratory distress Cardiac: Heart sounds, regular rate and rhythm Abdomen: Mild diffuse tenderness, soft Extremities: No leg edema Skin: No rash  Portacath/PICC-without erythema  Lab Results: Recent Labs    02/21/18 0938 02/22/18 1650  WBC 5.1 4.8  HGB 10.5* 10.3*  HCT 33.9* 32.8*  PLT 323 381    BMET Recent Labs    02/22/18 1650 02/23/18 0414  NA 140 141  K 3.4* 3.5  CL 99 101  CO2 30 33*  GLUCOSE 138* 115*  BUN 22 18  CREATININE 0.88 0.79  CALCIUM 9.0 9.4     Studies/Results: Dg Chest 2 View  Result Date: 02/22/2018 CLINICAL DATA:  Patient history of ovarian carcinoma. Shortness of breath. EXAM: CHEST - 2 VIEW COMPARISON:  Chest radiograph 12/31/2017 FINDINGS: Right anterior chest wall Port-A-Cath is present with tip projecting over the superior vena cava. Monitoring leads overlie the patient. Stable cardiac and mediastinal contours. Aortic atherosclerosis. Small left pleural effusion with underlying opacities. No pneumothorax. Thoracic spine degenerative changes. Chronic deformity right humerus. IMPRESSION: Persistent small left pleural  effusion with underlying opacities. Electronically Signed   By: Lovey Newcomer M.D.   On: 02/22/2018 19:14   Ct Angio Chest Pe W Or Wo Contrast  Result Date: 02/23/2018 CLINICAL DATA:  History of ovarian cancer and shortness of breath with elevated D-dimer EXAM: CT ANGIOGRAPHY CHEST WITH CONTRAST TECHNIQUE: Multidetector CT imaging of the chest was performed using the standard protocol during bolus administration of intravenous contrast. Multiplanar CT image reconstructions and MIPs were obtained to evaluate the vascular anatomy. CONTRAST:  119m ISOVUE-370 IOPAMIDOL (ISOVUE-370) INJECTION 76% COMPARISON:  01/04/2018 FINDINGS: Cardiovascular: Atherosclerotic changes of the thoracic aorta are noted without aneurysmal dilatation or dissection. Mild coronary calcifications are noted. No cardiac enlargement is seen. The pulmonary artery shows a normal branching pattern without intraluminal filling defect to suggest pulmonary embolism. Mediastinum/Nodes: Thoracic inlet is within normal limits. No hilar or mediastinal adenopathy is noted. No esophageal abnormality is seen. Lungs/Pleura: Diffuse emphysematous changes are identified. Mild by basilar atelectasis is noted stable from the prior exam. Small bilateral pleural effusions are noted but improved when compared with the prior study. No new focal pulmonary nodule is seen. Upper Abdomen: Visualized upper abdomen demonstrates changes of mild ascites consistent with patient's given clinical history. No other focal abnormality is noted. Musculoskeletal: Degenerative changes of the thoracic spine are noted. Review of the MIP images confirms the above findings. IMPRESSION: No evidence of pulmonary emboli. Bibasilar atelectasis/scarring with small effusions. Aortic Atherosclerosis (ICD10-I70.0) and Emphysema (ICD10-J43.9). Electronically Signed   By: MInez CatalinaM.D.   On: 02/23/2018 08:54    Medications: I have reviewed the patient's  current  medications.  Assessment/Plan:  1. 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 Significant improvement in malignant right pleural effusion.  Status post robotic-assisted laparoscopic hysterectomy with bilateral salpingoophorectomy, omentectomy, radical tumor debulking 12/29/2014. Per Dr. Serita Grit office note 01/14/2015 cytoreduction was optimal with residual disease remaining only in the 1 mm implants on the small intestine. 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  MSI-stable, mutation burden-4, BRAF rearrangement,ERBB4, KRAS G12D Cycle 1 adjuvant Taxol/carboplatin 01/20/2015 Cycle 2 adjuvant Taxol/carboplatin 02/10/2015 Cycle 3 adjuvant Taxol/carboplatin 03/10/2015 CA 125 on 1013 2016-42  CTs of the chest, abdomen, and pelvis 05/31/2015 with no  evidence of progressive ovarian cancer  CTs of the chest, abdomen, and pelvis 08/07/2015 and 08/08/2015-no evidence of progressive ovarian cancer  Peritoneal studding noted at the time of the cholecystectomy procedure 08/09/2015 Elevated CA 125 10/07/2015  CT 10/07/2015 with stricturing at the sigmoid colon, constipation, and omental nodularity  Initiation of salvage weekly Taxol chemotherapy 10/13/2015  Taxol changed to every 2 weeks beginning 01/19/2016 due to painful neuropathy.  CT scans 07/19/2016 with no acute findings. No features in the abdomen or pelvis to suggest recurrent disease.Stable mild fullness right intrarenal collecting system.  Elevated CA 125 treatment resumed with Taxol/Avastin on a 2 week schedule 09/27/2016  CT 02/12/2017-no evidence of carcinomatosis, no evidence of progressive metastatic disease  Taxol/Avastin continued every 2 weeks  CT 09/10/2017-increase in trace ascites, no evidence of progressive carcinomatosis, inflammatory changes at the lung bases with a new 3 mm right lower lobe nodule  Taxol/Avastin continued every 2 weeks  Progressive rise in the CA 125  Cycle 1 carboplatin 3-week schedule4/17/2019  Cycle 2 carboplatin 11/14/2017  Progressive malignant left pleural effusion June 2019  CT 01/07/2018-resolution of pulmonary nodule seen on prior examination. New small bilateral pleural effusions left greater than right with areas of atelectasis. Stable scattered mediastinal and hilar lymph nodes some partially calcified. Stable emphysematous changes and areas of pulmonary scarring. Small volume abdominal ascites but no omental or peritoneal surface disease. Small scattered mesenteric and retroperitoneal lymph nodes stable. Stable nodularity left adrenal gland.  Cycle 1 gemcitabine 01/09/2018 (day 1/day 8 schedule)  Cycle 2 gemcitabine 01/30/2018  Cycle 3 gemcitabine 02/21/2018 2. COPD-followed by Dr. Lamonte Sakai. 3. Dyspnea secondary to COPD and  the large right pleural effusion, status post therapeutic thoracentesis procedures 09/30/2014,10/09/2014, and 10/21/2014. Left thoracentesis 10/30/2014  Progressive left pleural effusion noted on chest x-ray 12/17/2017  Left thoracentesis 12/19/2017, cytology positive for metastatic adenocarcinoma 4. Left nephrectomy as a child 5. Delayed nausea following cycle 1 Taxol/carboplatin, Aloxi/Emend added with cycle 2 with improvement. 6. Right lower extremity edema, right calf pain 12/09/2014. Negative venous Doppler 12/09/2014. 7. Diffuse pruritus following cycle 1 adjuvant Taxol/carboplatin 01/20/2015, no rash, resolved with a steroid  dose pack 8. Thrombocytopenia second to chemotherapy, the carboplatin was dose reduced with cycle 2 adjuvant Taxol/carboplatin 02/10/2015 9. Chemotherapy-induced peripheral neuropathy-painful peripheral neuropathy involving the feet 01/19/2016.  10. Admission with acute cholecystitis 08/07/2015, status post a cholecystectomy 08/09/2015 11. Admission 10/08/2015 with abdominal pain/constipation, improved with bowel rest and laxatives 12. Mild right hydronephrosis noted on the CT 10/07/2015. Renal ultrasound 12/16/2015 with no hydronephrosis noted.No hydronephrosis on CT 07/19/2016. 13. Anemia secondary to chronic disease and chemotherapy, status post a red cell transfusion 12/05/2017 14. Admission 02/22/2018 with increased dyspnea  Lynn Ramirez was admitted yesterday with increased dyspnea.  I suspect the dyspnea is related to a flare of COPD.  There is no clinical or CT evidence of an infection, pulmonary embolism, drug-induced pneumonitis, or reaccumulation of pleural fluid.  Recommendations: 1.  Management of COPD/dyspnea per internal medicine 2.  Follow-up as scheduled at the Cancer center for gemcitabine chemotherapy and an office visit 3.  Please call Oncology as needed   LOS: 1 day   Betsy Coder, MD   02/23/2018, 9:59 AM

## 2018-02-23 NOTE — Evaluation (Signed)
Physical Therapy Evaluation Patient Details Name: Lynn Ramirez MRN: [id number] DOB: [birth year] Today's Date: 02/23/2018   History of Present Illness  Patient is a 77 y/o female presenting to the ED on 02/22/18 with worsening dyspnea. 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     Clinical Impression  Lynn Ramirez is a very pleasant 77 y/o female admitted with the above listed diagnosis. Patient reports that prior to admission she was Mod I with mobility and ADLs with use of supplemental O2. Patient today requiring general min guard for mobility for safety but without LOB or overt instability. O2 sats on 2L via [place] before and after activity remaining above 90%, however SOB with exertion. Will recommend outpatient PT at discharge to progress safe functional mobility, endurance and strength.     Follow Up Recommendations Outpatient PT;Supervision - Intermittent    Equipment Recommendations  None recommended by PT    Recommendations for Other Services       Precautions / Restrictions Precautions Precautions: Fall Restrictions Weight Bearing Restrictions: No      Mobility  Bed Mobility Overal bed mobility: Modified Independent                Transfers Overall transfer level: Modified independent Equipment used: Rolling walker (2 wheeled)                Ambulation/Gait Ambulation/Gait assistance: Min guard Gait Distance (Feet): 120 Feet Assistive device: None Gait Pattern/deviations: Step-to pattern;Step-through pattern;Decreased stride length;Drifts right/left Gait velocity: decreased   General Gait Details: mild unsteadiness throughout - reports knee issues at baseline; intermittently reaches for handrails;    Stairs            Wheelchair Mobility    Modified Rankin (Stroke Patients Only)       Balance Overall balance assessment: Mild deficits observed, not formally tested                                           Pertinent Vitals/Pain Pain Assessment: No/denies pain    Home Living Family/patient expects to be discharged to:: Private residence Living Arrangements: Spouse/significant other Available Help at Discharge: Family;Available 24 hours/day Type of Home: House Home Access: Stairs to enter Entrance Stairs-Rails: None Entrance Stairs-Number of Steps: 2 Home Layout: One level Home Equipment: Walker - 4 wheels;Cane - single point;Shower seat      Prior Function Level of Independence: Independent with assistive device(s)         Comments: SPC vs rollator; has a housecleaner     Hand Dominance        Extremity/Trunk Assessment        Lower Extremity Assessment Lower Extremity Assessment: Generalized weakness    Cervical / Trunk Assessment Cervical / Trunk Assessment: Normal  Communication   Communication: No difficulties  Cognition Arousal/Alertness: Awake/alert Behavior During Therapy: WFL for tasks assessed/performed Overall Cognitive Status: Within Functional Limits for tasks assessed                                        General Comments General comments (skin integrity, edema, etc.): on 2L supplemental O2 during  mobility; O2 at rest 97%, O2 after activity 93%    Exercises     Assessment/Plan    PT Assessment Patient needs continued PT services  PT Problem List Decreased strength;Decreased activity tolerance;Decreased balance;Decreased mobility;Decreased knowledge of use of DME;Decreased safety awareness       PT Treatment Interventions DME instruction;Gait training;Functional mobility training;Therapeutic activities;Therapeutic exercise;Balance training;Patient/family education    PT Goals (Current  goals can be found in the Care Plan section)  Acute Rehab PT Goals Patient Stated Goal: improve strength PT Goal Formulation: With patient Time For Goal Achievement: 03/02/18 Potential to Achieve Goals: Good    Frequency Min 3X/week   Barriers to discharge        Co-evaluation               AM-PAC PT "6 Clicks" Daily Activity  Outcome Measure Difficulty turning over in bed (including adjusting bedclothes, sheets and blankets)?: A Little Difficulty moving from lying on back to sitting on the side of the bed? : A Little Difficulty sitting down on and standing up from a chair with arms (e.g., wheelchair, bedside commode, etc,.)?: A Little Help needed moving to and from a bed to chair (including a wheelchair)?: A Little Help needed walking in hospital room?: A Little Help needed climbing 3-5 steps with a railing? : A Lot 6 Click Score: 17    End of Session Equipment Utilized During Treatment: Gait belt;Oxygen(2L via [place]) Activity Tolerance: Patient tolerated treatment well Patient left: in bed;with call bell/phone within reach;with family/visitor present Nurse Communication: Mobility status PT Visit Diagnosis: Unsteadiness on feet (R26.81);Other abnormalities of gait and mobility (R26.89);Muscle weakness (generalized) (M62.81)    Time: 9480-1655 PT Time Calculation (min) (ACUTE ONLY): 31 min   Charges:   PT Evaluation $PT Eval Moderate Complexity: 1 Mod PT Treatments $Gait Training: 8-22 mins       Lanney Gins, PT, DPT 02/23/18 10:52 AM Pager: [phone]

## 2018-02-23 NOTE — Discharge Summary (Signed)
Physician Discharge Summary   Patient ID: Lynn Ramirez MRN: [id number] DOB/AGE: [birth year] 77 y.o.  Admit date: 02/22/2018 Discharge date: 02/23/2018  Primary Care Physician:  Midge Minium, MD   Recommendations for Outpatient Follow-up:  1. Follow up with PCP and Dr. Lamonte Sakai in 1-2 weeks 2.   Home Health: Outpatient PT Equipment/Devices: None  Discharge Condition: stable  CODE STATUS: FULL  Diet recommendation: Heart  Discharge Diagnoses:   Marland Kitchen Mild COPD exacerbation ([place]) . COPD  GOLD C . Malignant pleural effusion . Metastatic adenocarcinoma ([place]) . Ovarian cancer ([place])    Consults: Oncology    Allergies:   Allergies  Allergen Reactions  . Latex Rash    Latex gloves ONLY (pt cannot wear them- no reaction if someone else touches her with latex gloves on)  . Penicillins Other (See Comments)    welts Has patient had a PCN reaction causing immediate rash, facial/tongue/throat swelling, SOB or lightheadedness with hypotension: Yes  Has patient had a PCN reaction causing severe rash involving mucus membranes or skin necrosis:No Has patient had a PCN reaction that required hospitalization:No Has patient had a PCN reaction occurring within the last 10 years:No If all of the above answers are "NO", then may proceed with Cephalosporin use.      DISCHARGE MEDICATIONS: Allergies as of 02/23/2018      Reactions   Latex Rash   Latex gloves ONLY (pt cannot wear them- no reaction if someone else touches her with latex gloves on)   Penicillins Other (See Comments)   welts Has patient had a PCN reaction causing immediate rash, facial/tongue/throat swelling, SOB or lightheadedness with hypotension: Yes  Has patient had a PCN reaction causing severe rash involving mucus membranes or skin necrosis:No Has patient had a PCN reaction that required hospitalization:No Has patient had a PCN reaction occurring within the last 10 years:No If all of the above answers are "NO",  then may proceed with Cephalosporin use.      Medication List    TAKE these medications   albuterol (2.5 MG/3ML) 0.083% nebulizer solution Commonly known as:  PROVENTIL Take 3 mLs (2.5 mg total) by nebulization every 6 (six) hours as needed for wheezing or shortness of breath.   PROAIR HFA 108 (90 Base) MCG/ACT inhaler Generic drug:  albuterol INHALE 2 PUFFS INTO THE LUNGS FOUR TIMES DAILY AS NEEDED FOR WHEEZING   antiseptic oral rinse Liqd 15 mLs by Mouth Rinse route 3 (three) times daily.   ARNUITY ELLIPTA 200 MCG/ACT Aepb Generic drug:  Fluticasone Furoate INHALE 1 PUFF INTO THE LUNGS DAILY   atorvastatin 10 MG tablet Commonly known as:  LIPITOR TAKE 1 TABLET(10 MG) BY MOUTH DAILY   Biotin 2500 MCG Caps Take 1 tablet by mouth daily.   carvedilol 3.125 MG tablet Commonly known as:  COREG TAKE 1 TABLET(3.125 MG) BY MOUTH TWICE DAILY WITH A MEAL   cholecalciferol 1000 units tablet Commonly known as:  VITAMIN D Take 1,000 Units by mouth daily.   diphenoxylate-atropine 2.5-0.025 MG tablet Commonly known as:  LOMOTIL TAKE 2 TABLETS BY MOUTH FOUR TIMES DAILY AS NEEDED FOR DIARRHEA/ LOOSE STOOLS   doxycycline 100 MG tablet Commonly known as:  VIBRA-TABS Take 1 tablet (100 mg total) by mouth 2 (two) times daily for 7 days.   fluocinonide cream 0.05 % Commonly known as:  LIDEX Apply 1 application topically 2 (two) times daily as needed (For ezcema).   furosemide 40 MG tablet Commonly known as:  LASIX Take 1 tablet (40  mg total) by mouth daily.   gabapentin 300 MG capsule Commonly known as:  NEURONTIN TAKE 1 CAPSULE BY MOUTH EVERY NIGHT AT BEDTIME   hydrOXYzine 25 MG capsule Commonly known as:  VISTARIL Take 1 capsule (25 mg total) by mouth 4 (four) times daily as needed for itching.   lidocaine-prilocaine cream Commonly known as:  EMLA Apply to port site one hour prior to use. Do not rub in. Cover with plastic.   loratadine 10 MG tablet Commonly known as:   CLARITIN Take 10 mg by mouth daily.   ondansetron 4 MG disintegrating tablet Commonly known as:  ZOFRAN-ODT Take 1 tablet (4 mg total) by mouth every 8 (eight) hours as needed for nausea or vomiting.   oxyCODONE-acetaminophen 5-325 MG tablet Commonly known as:  PERCOCET/ROXICET Take 1 tablet by mouth every 6 (six) hours as needed for severe pain.   predniSONE 20 MG tablet Commonly known as:  DELTASONE Take 2 tablets (40 mg total) by mouth daily with breakfast for 5 days.   pyridoxine 100 MG tablet Commonly known as:  B-6 Take 100 mg by mouth daily.   STIOLTO RESPIMAT 2.5-2.5 MCG/ACT Aers Generic drug:  Tiotropium Bromide-Olodaterol INHALE 2 PUFFS INTO THE LUNGS DAILY   traMADol 50 MG tablet Commonly known as:  ULTRAM Take 1 tablet (50 mg total) by mouth every 12 (twelve) hours as needed. for pain   TYLENOL 325 MG Caps Generic drug:  Acetaminophen Take 1 tablet by mouth 3 (three) times daily as needed (pain).   vitamin B-12 100 MCG tablet Commonly known as:  CYANOCOBALAMIN Take 100 mcg by mouth daily. Reported on 01/19/2016   zolpidem 5 MG tablet Commonly known as:  AMBIEN Take 1 tablet (5 mg total) by mouth at bedtime as needed for sleep.        Brief H and P: For complete details please refer to admission H and P, but in brief Lynn Ramirez is a 77 y.o. 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 Has had about 2 weeks of slightly worsening dyspnea. Yesterday after receiving chemotherapy (she thinks fourth cycle of gemcitabine, which started in early July) became more significantly dyspnic. Improved somewhat with home nebulizer. Awoke this morning more dyspnic. Called her oncologist,  advised to present to ED, has has suffered from significant pleural effusion in the past. Denies productive cough. Denies fever or chills. No nausea or vomiting. No hemoptysis. No new leg swelling.    Hospital Course:   Mild acute on chronic COPD exacerbation Acute on chronic hypoxic respiratory failure -Much improved, patient has a history of advanced COPD and chronic hypoxic respiratory failure -Chest x-ray showed persistent mild left pleural effusion but no pneumonia.  Patient has significant improvement with duo nebs x3. -D-dimer was elevated, hence CT and angiogram of the chest was pursued which showed no PE -There was a concern for possibility of the side effect of gemcitabine, oncology was consulted.  Patient seen by Dr. Learta Codding who felt patient symptoms were more consistent with acute on chronic COPD. -Patient currently back to baseline, home O2 evaluation was done.  Patient had some shortness of breath with exertion but sats remained above 90%. -Outpatient PT was ordered.  Patient was placed on albuterol nebs, doxycycline and prednisone 40 mg for 5 days  at the time of discharge.  History of advanced stage Fallopian tube carcinoma -Patient was seen by Dr. 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  BP 137/60 (BP Location: Right Arm)   Pulse 84   Temp 98.3 F (36.8 C) (Oral)   Resp 20   Ht 5\' 1"  (1.549 m)   Wt 78 kg   SpO2 99%   BMI 32.49 kg/m   Physical Exam: General: Alert and awake oriented x3 not in any acute distress. HEENT: anicteric sclera, pupils reactive to light and accommodation CVS: S1-S2 clear no murmur  rubs or gallops Chest: clear to auscultation bilaterally, no wheezing rales or rhonchi Abdomen: soft nontender, nondistended, normal bowel sounds Extremities: no cyanosis, clubbing or edema noted bilaterally Neuro: Cranial nerves II-XII intact, no focal neurological deficits   The results of significant diagnostics from this hospitalization (including imaging, microbiology, ancillary and laboratory) are listed below for reference.      Procedures/Studies:  Dg Chest 2 View  Result Date: 02/22/2018 CLINICAL DATA:  Patient history of ovarian carcinoma. Shortness of breath. EXAM: CHEST - 2 VIEW COMPARISON:  Chest radiograph 12/31/2017 FINDINGS: Right anterior chest wall Port-A-Cath is present with tip projecting over the superior vena cava. Monitoring leads overlie the patient. Stable cardiac and mediastinal contours. Aortic atherosclerosis. Small left pleural effusion with underlying opacities. No pneumothorax. Thoracic spine degenerative changes. Chronic deformity right humerus. IMPRESSION: Persistent small left pleural effusion with underlying opacities. Electronically Signed   By: Lovey Newcomer M.D.   On: 02/22/2018 19:14   Ct Angio Chest Pe W Or Wo Contrast  Result Date: 02/23/2018 CLINICAL DATA:  History of ovarian cancer and shortness of breath with elevated D-dimer EXAM: CT ANGIOGRAPHY CHEST WITH CONTRAST TECHNIQUE: Multidetector CT imaging of the chest was performed using the standard protocol during bolus administration of intravenous contrast. Multiplanar CT image reconstructions and MIPs were obtained to evaluate the vascular anatomy. CONTRAST:  19mL ISOVUE-370 IOPAMIDOL (ISOVUE-370) INJECTION 76% COMPARISON:  01/04/2018 FINDINGS: Cardiovascular: Atherosclerotic changes of the thoracic aorta are noted without aneurysmal dilatation or dissection. Mild coronary calcifications are noted. No cardiac enlargement is seen. The pulmonary artery shows a normal branching pattern without intraluminal  filling defect to suggest pulmonary embolism. Mediastinum/Nodes: Thoracic inlet is within normal limits. No hilar or mediastinal adenopathy is noted. No esophageal abnormality is seen. Lungs/Pleura: Diffuse emphysematous changes are identified. Mild by basilar atelectasis is noted stable from the prior exam. Small bilateral pleural effusions are noted but improved when compared with the prior study. No new focal pulmonary nodule is seen. Upper Abdomen: Visualized upper abdomen demonstrates changes of mild ascites consistent with patient's given clinical history. No other focal abnormality is noted. Musculoskeletal: Degenerative changes of the thoracic spine are noted. Review of the MIP images confirms the above findings. IMPRESSION: No evidence of pulmonary emboli. Bibasilar atelectasis/scarring with small effusions. Aortic Atherosclerosis (ICD10-I70.0) and Emphysema (ICD10-J43.9). Electronically Signed   By: Inez Catalina M.D.   On: 02/23/2018 08:54       LAB RESULTS: Basic Metabolic Panel: Recent Labs  Lab 02/22/18 1650 02/23/18 0414  NA 140 141  K 3.4* 3.5  CL 99 101  CO2 30 33*  GLUCOSE 138* 115*  BUN 22 18  CREATININE 0.88 0.79  CALCIUM 9.0 9.4  MG  --  1.4*   Liver Function Tests: Recent Labs  Lab 02/21/18 0938  AST 16  ALT 14  ALKPHOS 78  BILITOT 0.4  PROT 7.2  ALBUMIN 3.1*   No results for input(s): LIPASE, AMYLASE in the last 168 hours. No results for input(s): AMMONIA in the last 168 hours. CBC: Recent Labs  Lab 02/21/18 0938 02/22/18 1650  WBC 5.1 4.8  NEUTROABS 3.4  --   HGB 10.5* 10.3*  HCT 33.9* 32.8*  MCV 100.3 99.7  PLT 323 381   Cardiac Enzymes: No results for input(s): CKTOTAL, CKMB, CKMBINDEX, TROPONINI in the last 168 hours. BNP: Invalid input(s): POCBNP CBG: No results for input(s): GLUCAP in the last 168 hours.    Disposition and Follow-up: Discharge Instructions    Diet - low sodium heart healthy   Complete by:  As directed     Discharge instructions   Complete by:  As directed    Please continue albuterol nebs at least three times a day for next 3-5 days.   Increase activity slowly   Complete by:  As directed        DISPOSITION: Home   DISCHARGE FOLLOW-UP Follow-up Information    Midge Minium, MD. Schedule an appointment as soon as possible for a visit in 2 week(s).   Specialty:  Family Medicine Contact information: [address] [place] [address] [phone]        Collene Gobble, MD. Schedule an appointment as soon as possible for a visit in 2 week(s).   Specialty:  Pulmonary Disease Contact information: [address] [place] [address] [phone]        [facility] Follow up.   Specialty:  Rehabilitation Why:  will call to arrange appointment time, if you do not hear from them by wednesday, give them a call.  Contact information: [address] [id number] [place] [phone]           Time coordinating discharge:  25 minutes  Signed:   Estill Cotta M.D. [facility] 02/23/2018, 12:22 PM Pager: [phone]

## 2018-02-24 ENCOUNTER — Other Ambulatory Visit: Payer: Self-pay | Admitting: Oncology

## 2018-02-25 ENCOUNTER — Telehealth: Payer: Self-pay

## 2018-02-25 NOTE — Telephone Encounter (Signed)
Transition Care Management Follow-up Telephone Call  Admit date: 02/22/2018 Discharge date: 02/23/2018 Diagnosis: Mild COPD exacerbation   How have you been since you were released from the hospital? "much better"   Do you understand why you were in the hospital? yes   Do you understand the discharge instructions? yes   Where were you discharged to? Home.    Items Reviewed:  Medications reviewed: yes  Allergies reviewed: yes  Dietary changes reviewed: yes  Referrals reviewed: yes   Functional Questionnaire:   Activities of Daily Living (ADLs):   She states they are independent in the following: ambulation, bathing and hygiene, feeding, continence, grooming, toileting and dressing States they require assistance with the following: None.    Any transportation issues/concerns?: yes, daughter brings pt to appts (she will need to arrange transportation)   Any patient concerns? no   Confirmed importance and date/time of follow-up visits scheduled yes  Provider Appointment booked with PCP 03/01/18 @ 11am.   Confirmed with patient if condition begins to worsen call PCP or go to the ER.  Patient was given the office number and encouraged to call back with question or concerns.  : yes

## 2018-02-27 ENCOUNTER — Inpatient Hospital Stay: Payer: Medicare Other

## 2018-02-27 VITALS — BP 128/57 | HR 62 | Temp 97.9°F | Resp 18

## 2018-02-27 DIAGNOSIS — C482 Malignant neoplasm of peritoneum, unspecified: Secondary | ICD-10-CM

## 2018-02-27 DIAGNOSIS — C569 Malignant neoplasm of unspecified ovary: Secondary | ICD-10-CM

## 2018-02-27 DIAGNOSIS — Z5111 Encounter for antineoplastic chemotherapy: Secondary | ICD-10-CM | POA: Diagnosis not present

## 2018-02-27 DIAGNOSIS — C5700 Malignant neoplasm of unspecified fallopian tube: Secondary | ICD-10-CM

## 2018-02-27 DIAGNOSIS — C561 Malignant neoplasm of right ovary: Secondary | ICD-10-CM | POA: Diagnosis not present

## 2018-02-27 DIAGNOSIS — Z95828 Presence of other vascular implants and grafts: Secondary | ICD-10-CM

## 2018-02-27 DIAGNOSIS — J449 Chronic obstructive pulmonary disease, unspecified: Secondary | ICD-10-CM | POA: Diagnosis not present

## 2018-02-27 LAB — CBC WITH DIFFERENTIAL (CANCER CENTER ONLY)
BASOS ABS: 0 10*3/uL (ref 0.0–0.1)
BASOS PCT: 0 %
EOS ABS: 0 10*3/uL (ref 0.0–0.5)
Eosinophils Relative: 0 %
HCT: 30.4 % — ABNORMAL LOW (ref 34.8–46.6)
HEMOGLOBIN: 10 g/dL — AB (ref 11.6–15.9)
Lymphocytes Relative: 18 %
Lymphs Abs: 0.7 10*3/uL — ABNORMAL LOW (ref 0.9–3.3)
MCH: 31.9 pg (ref 25.1–34.0)
MCHC: 32.8 g/dL (ref 31.5–36.0)
MCV: 97.3 fL (ref 79.5–101.0)
MONO ABS: 0.2 10*3/uL (ref 0.1–0.9)
MONOS PCT: 6 %
NEUTROS ABS: 2.8 10*3/uL (ref 1.5–6.5)
NEUTROS PCT: 76 %
Platelet Count: 298 10*3/uL (ref 145–400)
RBC: 3.12 MIL/uL — ABNORMAL LOW (ref 3.70–5.45)
RDW: 18.3 % — AB (ref 11.2–14.5)
WBC Count: 3.7 10*3/uL — ABNORMAL LOW (ref 3.9–10.3)

## 2018-02-27 MED ORDER — SODIUM CHLORIDE 0.9% FLUSH
10.0000 mL | INTRAVENOUS | Status: DC | PRN
  Administered 2018-02-27: 10 mL via INTRAVENOUS
  Filled 2018-02-27: qty 10

## 2018-02-27 MED ORDER — SODIUM CHLORIDE 0.9 % IV SOLN
Freq: Once | INTRAVENOUS | Status: AC
  Administered 2018-02-27: 12:00:00 via INTRAVENOUS
  Filled 2018-02-27: qty 250

## 2018-02-27 MED ORDER — SODIUM CHLORIDE 0.9% FLUSH
10.0000 mL | INTRAVENOUS | Status: DC | PRN
  Administered 2018-02-27: 10 mL
  Filled 2018-02-27: qty 10

## 2018-02-27 MED ORDER — PROCHLORPERAZINE MALEATE 10 MG PO TABS
10.0000 mg | ORAL_TABLET | Freq: Once | ORAL | Status: AC
  Administered 2018-02-27: 10 mg via ORAL

## 2018-02-27 MED ORDER — PROCHLORPERAZINE MALEATE 10 MG PO TABS
ORAL_TABLET | ORAL | Status: AC
  Filled 2018-02-27: qty 1

## 2018-02-27 MED ORDER — HEPARIN SOD (PORK) LOCK FLUSH 100 UNIT/ML IV SOLN
500.0000 [IU] | Freq: Once | INTRAVENOUS | Status: AC | PRN
  Administered 2018-02-27: 500 [IU]
  Filled 2018-02-27: qty 5

## 2018-02-27 MED ORDER — SODIUM CHLORIDE 0.9 % IV SOLN
640.0000 mg/m2 | Freq: Once | INTRAVENOUS | Status: AC
  Administered 2018-02-27: 1178 mg via INTRAVENOUS
  Filled 2018-02-27: qty 30.98

## 2018-02-27 NOTE — Patient Instructions (Signed)
[facility] Discharge Instructions for Patients Receiving Chemotherapy  Today you received the following chemotherapy agents gemzar.  To help prevent nausea and vomiting after your treatment, we encourage you to take your nausea medication as directed.   If you develop nausea and vomiting that is not controlled by your nausea medication, call the clinic.   BELOW ARE SYMPTOMS THAT SHOULD BE REPORTED IMMEDIATELY:  *FEVER GREATER THAN 100.5 F  *CHILLS WITH OR WITHOUT FEVER  NAUSEA AND VOMITING THAT IS NOT CONTROLLED WITH YOUR NAUSEA MEDICATION  *UNUSUAL SHORTNESS OF BREATH  *UNUSUAL BRUISING OR BLEEDING  TENDERNESS IN MOUTH AND THROAT WITH OR WITHOUT PRESENCE OF ULCERS  *URINARY PROBLEMS  *BOWEL PROBLEMS  UNUSUAL RASH Items with * indicate a potential emergency and should be followed up as soon as possible.  Feel free to call the clinic should you have any questions or concerns. The clinic phone number is [phone].  Please show the CHEMO ALERT CARD at check-in to the Emergency Department and triage nurse.   

## 2018-02-27 NOTE — Progress Notes (Signed)
Ok to treat with CMP from 8/15 per Dr. Benay Spice

## 2018-03-01 ENCOUNTER — Encounter: Payer: Self-pay | Admitting: Family Medicine

## 2018-03-01 ENCOUNTER — Ambulatory Visit (INDEPENDENT_AMBULATORY_CARE_PROVIDER_SITE_OTHER): Payer: Medicare Other | Admitting: Family Medicine

## 2018-03-01 ENCOUNTER — Other Ambulatory Visit: Payer: Self-pay

## 2018-03-01 VITALS — BP 112/74 | HR 67 | Temp 97.7°F | Resp 17 | Ht 61.0 in | Wt 173.8 lb

## 2018-03-01 DIAGNOSIS — J441 Chronic obstructive pulmonary disease with (acute) exacerbation: Secondary | ICD-10-CM

## 2018-03-01 DIAGNOSIS — J440 Chronic obstructive pulmonary disease with acute lower respiratory infection: Secondary | ICD-10-CM

## 2018-03-01 DIAGNOSIS — J910 Malignant pleural effusion: Secondary | ICD-10-CM | POA: Diagnosis not present

## 2018-03-01 DIAGNOSIS — C801 Malignant (primary) neoplasm, unspecified: Secondary | ICD-10-CM

## 2018-03-01 NOTE — Patient Instructions (Addendum)
Follow up as scheduled- sooner if needed No need for labs today Keep up the good work!  You look great! I'll keep an eye out for Dr Agustina Caroli and Dr Francesca Oman notes next week Call with any questions or concerns Happy Labor Day!!!

## 2018-03-01 NOTE — Progress Notes (Signed)
   Subjective:    Patient ID: Lynn Ramirez, female    DOB: 16-Sep-1940, 77 y.o.   MRN: [id number]  [facility] f/u- pt was admitted 8/16-17 w/ COPD exacerbation and malignant effusion.  Pt had + D dimer but negative CTA.  She improved w/ neb treatments and was dc'd home w/ nebs, Doxy, and Prednisone.  Pt reports feeling good.  Continues to have some SOB 'but nothing compared to what it was'.  Pt was having swelling- she thought it was due to the Taxol.  Called Dr Meda Coffee and she increased Lasix to 40mg  until she sees Dr Meda Coffee next Thursday.  Sees Dr Lamonte Sakai on Monday.  Referral was placed to outpt therapy but pt hasn't heard yet.  Reviewed H&P, D/C summary, labs, imaging   Review of Systems For ROS see HPI     Objective:   Physical Exam  Constitutional: She is oriented to person, place, and time. She appears well-developed and well-nourished. No distress.  HENT:  Head: Normocephalic and atraumatic.  Eyes: Pupils are equal, round, and reactive to light. Conjunctivae and EOM are normal.  Neck: Normal range of motion. Neck supple. No thyromegaly present.  Cardiovascular: Normal rate, regular rhythm, normal heart sounds and intact distal pulses.  No murmur heard. Pulmonary/Chest: Effort normal and breath sounds normal. No respiratory distress.  Abdominal: Soft. She exhibits no distension. There is no tenderness.  Musculoskeletal: She exhibits no edema.  Lymphadenopathy:    She has no cervical adenopathy.  Neurological: She is alert and oriented to person, place, and time.  Skin: Skin is warm and dry.  Psychiatric: She has a normal mood and affect. Her behavior is normal.  Vitals reviewed.         Assessment & Plan:

## 2018-03-03 ENCOUNTER — Emergency Department (HOSPITAL_COMMUNITY)
Admission: EM | Admit: 2018-03-03 | Discharge: 2018-03-03 | Disposition: A | Discharge status: Home / Self Care | Payer: Medicare Other | Attending: Emergency Medicine | Admitting: Emergency Medicine

## 2018-03-03 ENCOUNTER — Emergency Department (HOSPITAL_COMMUNITY): Payer: Medicare Other

## 2018-03-03 ENCOUNTER — Encounter (HOSPITAL_COMMUNITY): Payer: Self-pay | Admitting: Emergency Medicine

## 2018-03-03 ENCOUNTER — Other Ambulatory Visit: Payer: Self-pay

## 2018-03-03 DIAGNOSIS — E785 Hyperlipidemia, unspecified: Secondary | ICD-10-CM | POA: Insufficient documentation

## 2018-03-03 DIAGNOSIS — R1033 Periumbilical pain: Secondary | ICD-10-CM | POA: Diagnosis not present

## 2018-03-03 DIAGNOSIS — Z79899 Other long term (current) drug therapy: Secondary | ICD-10-CM | POA: Insufficient documentation

## 2018-03-03 DIAGNOSIS — R1110 Vomiting, unspecified: Secondary | ICD-10-CM | POA: Diagnosis not present

## 2018-03-03 DIAGNOSIS — Z87891 Personal history of nicotine dependence: Secondary | ICD-10-CM | POA: Diagnosis not present

## 2018-03-03 DIAGNOSIS — I2510 Atherosclerotic heart disease of native coronary artery without angina pectoris: Secondary | ICD-10-CM | POA: Diagnosis not present

## 2018-03-03 DIAGNOSIS — K529 Noninfective gastroenteritis and colitis, unspecified: Secondary | ICD-10-CM | POA: Diagnosis not present

## 2018-03-03 DIAGNOSIS — Z91040 Latex allergy status: Secondary | ICD-10-CM | POA: Insufficient documentation

## 2018-03-03 DIAGNOSIS — N182 Chronic kidney disease, stage 2 (mild): Secondary | ICD-10-CM | POA: Insufficient documentation

## 2018-03-03 DIAGNOSIS — Z8543 Personal history of malignant neoplasm of ovary: Secondary | ICD-10-CM | POA: Diagnosis not present

## 2018-03-03 DIAGNOSIS — I130 Hypertensive heart and chronic kidney disease with heart failure and stage 1 through stage 4 chronic kidney disease, or unspecified chronic kidney disease: Secondary | ICD-10-CM | POA: Diagnosis not present

## 2018-03-03 DIAGNOSIS — I5032 Chronic diastolic (congestive) heart failure: Secondary | ICD-10-CM | POA: Insufficient documentation

## 2018-03-03 DIAGNOSIS — R109 Unspecified abdominal pain: Secondary | ICD-10-CM | POA: Diagnosis not present

## 2018-03-03 DIAGNOSIS — J449 Chronic obstructive pulmonary disease, unspecified: Secondary | ICD-10-CM | POA: Insufficient documentation

## 2018-03-03 DIAGNOSIS — R112 Nausea with vomiting, unspecified: Secondary | ICD-10-CM | POA: Diagnosis not present

## 2018-03-03 LAB — COMPREHENSIVE METABOLIC PANEL
ALT: 30 U/L (ref 0–44)
ANION GAP: 10 (ref 5–15)
AST: 21 U/L (ref 15–41)
Albumin: 2.6 g/dL — ABNORMAL LOW (ref 3.5–5.0)
Alkaline Phosphatase: 56 U/L (ref 38–126)
BILIRUBIN TOTAL: 0.7 mg/dL (ref 0.3–1.2)
BUN: 31 mg/dL — AB (ref 8–23)
CO2: 31 mmol/L (ref 22–32)
Calcium: 9.1 mg/dL (ref 8.9–10.3)
Chloride: 100 mmol/L (ref 98–111)
Creatinine, Ser: 0.98 mg/dL (ref 0.44–1.00)
GFR calc Af Amer: >60 mL/min (ref >60)
GFR, EST NON AFRICAN AMERICAN: 55 mL/min — AB (ref >60)
Glucose, Bld: 132 mg/dL — ABNORMAL HIGH (ref 70–99)
POTASSIUM: 4.4 mmol/L (ref 3.5–5.1)
Sodium: 141 mmol/L (ref 135–145)
TOTAL PROTEIN: 6 g/dL — AB (ref 6.5–8.1)

## 2018-03-03 LAB — CBC WITH DIFFERENTIAL/PLATELET
BASOS ABS: 0 10*3/uL (ref 0.0–0.1)
Basophils Relative: 0 %
EOS PCT: 1 %
Eosinophils Absolute: 0.1 10*3/uL (ref 0.0–0.7)
HCT: 35.1 % — ABNORMAL LOW (ref 36.0–46.0)
Hemoglobin: 11.2 g/dL — ABNORMAL LOW (ref 12.0–15.0)
LYMPHS PCT: 13 %
Lymphs Abs: 0.8 10*3/uL (ref 0.7–4.0)
MCH: 32.1 pg (ref 26.0–34.0)
MCHC: 31.9 g/dL (ref 30.0–36.0)
MCV: 100.6 fL — AB (ref 78.0–100.0)
Monocytes Absolute: 0 10*3/uL — ABNORMAL LOW (ref 0.1–1.0)
Monocytes Relative: 1 %
NEUTROS ABS: 5.4 10*3/uL (ref 1.7–7.7)
Neutrophils Relative %: 85 %
Platelets: 179 10*3/uL (ref 150–400)
RBC: 3.49 MIL/uL — AB (ref 3.87–5.11)
RDW: 17.8 % — ABNORMAL HIGH (ref 11.5–15.5)
WBC: 6.4 10*3/uL (ref 4.0–10.5)

## 2018-03-03 LAB — I-STAT CG4 LACTIC ACID, ED: Lactic Acid, Venous: 0.85 mmol/L (ref 0.5–1.9)

## 2018-03-03 LAB — LIPASE, BLOOD: Lipase: 21 U/L (ref 11–51)

## 2018-03-03 MED ORDER — DICYCLOMINE HCL 20 MG PO TABS: 20.0000 mg | ORAL_TABLET | Freq: Two times a day (BID) | ORAL | 0 refills | Status: DC

## 2018-03-03 MED ORDER — IOPAMIDOL (ISOVUE-300) INJECTION 61%
INTRAVENOUS | Status: AC
  Filled 2018-03-03: qty 100

## 2018-03-03 MED ORDER — HEPARIN SOD (PORK) LOCK FLUSH 100 UNIT/ML IV SOLN
500.0000 [IU] | Freq: Once | INTRAVENOUS | Status: AC
  Administered 2018-03-03: 500 [IU]
  Filled 2018-03-03: qty 5

## 2018-03-03 MED ORDER — SODIUM CHLORIDE 0.9 % IV BOLUS
1000.0000 mL | Freq: Once | INTRAVENOUS | Status: AC
  Administered 2018-03-03: 1000 mL via INTRAVENOUS

## 2018-03-03 MED ORDER — DICYCLOMINE HCL 10 MG PO CAPS
20.0000 mg | ORAL_CAPSULE | Freq: Once | ORAL | Status: AC
  Administered 2018-03-03: 20 mg via ORAL
  Filled 2018-03-03: qty 2

## 2018-03-03 MED ORDER — ONDANSETRON HCL 4 MG/2ML IJ SOLN
4.0000 mg | Freq: Once | INTRAMUSCULAR | Status: AC
  Administered 2018-03-03: 4 mg via INTRAVENOUS
  Filled 2018-03-03: qty 2

## 2018-03-03 MED ORDER — MORPHINE SULFATE (PF) 4 MG/ML IV SOLN
4.0000 mg | Freq: Once | INTRAVENOUS | Status: AC | PRN
  Administered 2018-03-03: 4 mg via INTRAVENOUS
  Filled 2018-03-03: qty 1

## 2018-03-03 MED ORDER — IOPAMIDOL (ISOVUE-300) INJECTION 61%
100.0000 mL | Freq: Once | INTRAVENOUS | Status: AC | PRN
  Administered 2018-03-03: 100 mL via INTRAVENOUS

## 2018-03-03 NOTE — ED Triage Notes (Signed)
Patient c/o generalized abdominal pain with vomiting since yesterday. Hx ovarian cancer. Last treatment Wednesday. Denies chest pain and SOB. Hx COPD. On 2L baseline.

## 2018-03-03 NOTE — ED Provider Notes (Signed)
[facility] DEPT Provider Note   CSN: [id number] Arrival date & time: 03/03/18  1713     History   Chief Complaint Chief Complaint  Patient presents with  . Abdominal Pain  . Emesis    HPI Lynn Ramirez is a 77 y.o. female.  HPI   Lynn Ramirez is a 77 y.o. female, with a history of metastatic ovarian cancer, CKD, left nephrectomy, complete hysterectomy, cholecystectomy, appendectomy, HTN, hyperlipidemia, and COPD presenting to the ED with abdominal pain beginning yesterday morning around 1 AM.  Pain is just superior to the umbilicus, burning, [birth year], nonradiating.  She has not had this pain before.  Accompanied by nausea and vomiting with 4-5 episodes of nonbilious, nonbloody emesis today. She had an episode of loose stool yesterday, but has not had a bowel movement since that time.  She finished a 7-day course of doxycycline today. Patient is undergoing chemotherapy with last session August 21.  Her oncologist is Dr. Learta Codding. Denies fever/chills, hematemesis, hematochezia/melena, urinary symptoms, chest pain, shortness of breath, or any other complaints.   Past Medical History:  Diagnosis Date  . CKD (chronic kidney disease), stage II   . COPD (chronic obstructive pulmonary disease) ([place])   . Coronary artery calcification seen on CT scan   . Difficulty sleeping   . Eczema    hands  . Emphysema   . GERD (gastroesophageal reflux disease)   . H/O hydronephrosis   . History of transfusion    age 74  . Hyperlipidemia   . Hypertension   . Ovarian cancer ([place]) dx'd 09/2014   metastatic - prior malignant R pleural effusion and peritoneal carcinomatosis  . S/p nephrectomy     Patient Active Problem List   Diagnosis Date Noted  . COPD with acute exacerbation ([place]) 02/22/2018  . Normocytic anemia 02/22/2018  . Hypokalemia 02/22/2018  . On home oxygen therapy 02/22/2018  . Left leg pain 07/16/2017  . S/p nephrectomy   . History of  transfusion   . H/O hydronephrosis   . GERD (gastroesophageal reflux disease)   . Eczema   . Difficulty sleeping   . Coronary artery calcification seen on CT scan   . CKD (chronic kidney disease), stage II   . Chronic diastolic CHF (congestive heart failure) ([place]) 04/26/2017  . Cardiomyopathy ([place]) 04/26/2017  . Mixed incontinence urge and stress 01/31/2017  . Chronic cough 01/15/2017  . Patient on antineoplastic chemotherapy regimen 10/18/2016  . Pain and swelling of right lower extremity 10/02/2016  . Goals of care, counseling/discussion 09/26/2016  . HTN (hypertension) 08/07/2016  . B12 deficiency 08/07/2016  . Vitamin D deficiency 08/07/2016  . Diarrhea 07/06/2016  . Cramping of hands 07/06/2016  . Port catheter in place 11/02/2015  . Allergic rhinitis 11/02/2015  . Constipation   . Sigmoid stricture ([place])   . Obstipation 10/08/2015  . COPD (chronic obstructive pulmonary disease) ([place])   . Pulmonary nodules/lesions, multiple 08/08/2015  . Chronic respiratory failure with hypoxia ([place]) 06/20/2015  . CAD (coronary artery disease) 03/31/2015  . Hyperlipidemia 03/31/2015  . Claudication ([place]) 03/31/2015  . Chemotherapy-induced neuropathy ([place]) 03/25/2015  . Genetic testing 02/19/2015  . Malignant neoplasm of fallopian tube ([place]) 02/19/2015  . Family history of pancreatic cancer 02/19/2015  . Family history of breast cancer in female 02/19/2015  . Ovarian cancer ([place]) 12/29/2014  . Primary peritoneal carcinomatosis ([place]) 10/23/2014  . Metastatic adenocarcinoma ([place]) 10/21/2014  . Insomnia 10/11/2014  . Pelvic mass in female 10/05/2014  . Malignant pleural  effusion 09/29/2014  . Encopresis 06/18/2014  . COPD  GOLD C 03/07/2012    Past Surgical History:  Procedure Laterality Date  . ABDOMINAL HYSTERECTOMY    . CHOLECYSTECTOMY N/A 08/09/2015   Procedure: Attempted LAPAROSCOPIC coverted  open CHOLECYSTECTOMY WITH INTRAOPERATIVE CHOLANGIOGRAM;  Surgeon: Autumn Messing III, MD;   Location: [facility];  Service: General;  Laterality: N/A;  . HAND SURGERY Right 1980's  . LAPAROTOMY N/A 12/29/2014   Procedure:  LAPAROTOMY;  Surgeon: Everitt Amber, MD;  Location: [facility];  Service: Gynecology;  Laterality: N/A;  . NEPHRECTOMY    . ROBOTIC ASSISTED TOTAL HYSTERECTOMY WITH BILATERAL SALPINGO OOPHERECTOMY Bilateral 12/29/2014   Procedure: ROBOTIC ASSISTED TOTAL LAPAROSCOPIC HYSTERECTOMY WITH BILATERAL SALPINGO OOPHORECTOMY AND OOMENTECTOMY WITH RADICAL TUMOR [place] ;  Surgeon: Everitt Amber, MD;  Location: [facility];  Service: Gynecology;  Laterality: Bilateral;  . THORACENTESIS     several  . TONSILLECTOMY    . TUBAL LIGATION       OB History   None      Home Medications    Prior to Admission medications   Medication Sig Start Date End Date Taking? Authorizing Provider  acetaminophen (TYLENOL) 650 MG CR tablet Take 650 mg by mouth every 8 (eight) hours as needed for pain.   Yes [provider]  albuterol (PROVENTIL) (2.5 MG/3ML) 0.083% nebulizer solution INHALE THE CONTENTS OF 1 VIAL VIA NEBULIZER EVERY 6 HOURS AS NEEDED FOR WHEEZING OR SHORTNESS OF BREATH Patient taking differently: Take 2.5 mg by nebulization every 6 (six) hours as needed for wheezing or shortness of breath.  02/25/18  Yes Collene Gobble, MD  antiseptic oral rinse (BIOTENE) LIQD 15 mLs by Mouth Rinse route 3 (three) times daily.   Yes [provider]  ARNUITY ELLIPTA 200 MCG/ACT AEPB INHALE 1 PUFF INTO THE LUNGS DAILY Patient taking differently: Inhale 1 puff into the lungs daily.  09/11/17  Yes Collene Gobble, MD  atorvastatin (LIPITOR) 10 MG tablet TAKE 1 TABLET(10 MG) BY MOUTH DAILY Patient taking differently: Take 10 mg by mouth daily at 6 PM.  08/24/17  Yes Dorothy Spark, MD  Biotin 2500 MCG CAPS Take 1 tablet by mouth daily.   Yes [provider]  carvedilol (COREG) 3.125 MG tablet TAKE 1 TABLET(3.125 MG) BY MOUTH TWICE DAILY WITH A MEAL 11/27/17  Yes Dorothy Spark, MD    cholecalciferol (VITAMIN D) 1000 units tablet Take 1,000 Units by mouth daily.   Yes [provider]  diphenoxylate-atropine (LOMOTIL) 2.5-0.025 MG tablet TAKE 2 TABLETS BY MOUTH FOUR TIMES DAILY AS NEEDED FOR DIARRHEA/ LOOSE STOOLS 08/03/17  Yes Ladell Pier, MD  esomeprazole (NEXIUM) 40 MG capsule Take 40 mg by mouth daily at 12 noon.   Yes [provider]  fluocinonide cream (LIDEX) 4.09 % Apply 1 application topically 2 (two) times daily as needed (For ezcema).    Yes [provider]  furosemide (LASIX) 40 MG tablet Take 1 tablet (40 mg total) by mouth daily. 12/10/17  Yes Dorothy Spark, MD  gabapentin (NEURONTIN) 300 MG capsule TAKE 1 CAPSULE BY MOUTH EVERY NIGHT AT BEDTIME 12/17/17  Yes Ladell Pier, MD  hydrOXYzine (VISTARIL) 25 MG capsule Take 1 capsule (25 mg total) by mouth 4 (four) times daily as needed for itching. 01/03/18  Yes Ladell Pier, MD  lidocaine-prilocaine (EMLA) cream Apply to port site one hour prior to use. Do not rub in. Cover with plastic. 10/12/16  Yes Ladell Pier, MD  loratadine (CLARITIN) 10 MG tablet Take 10 mg by mouth daily.   Yes [provider]  ondansetron (ZOFRAN-ODT) 4 MG disintegrating tablet Take 1 tablet (4 mg total) by mouth every 8 (eight) hours as needed for nausea or vomiting. 12/05/17  Yes Ladell Pier, MD  oxyCODONE-acetaminophen (PERCOCET/ROXICET) 5-325 MG tablet Take 1 tablet by mouth every 6 (six) hours as needed for severe pain. 08/15/17  Yes Ladell Pier, MD  PROAIR HFA 108 [phone] Base) MCG/ACT inhaler INHALE 2 PUFFS INTO THE LUNGS FOUR TIMES DAILY AS NEEDED FOR WHEEZING Patient taking differently: Inhale 2 puffs into the lungs 4 (four) times daily as needed for wheezing.  03/13/17  Yes Collene Gobble, MD  pyridoxine (B-6) 100 MG tablet Take 100 mg by mouth daily.   Yes [provider]  STIOLTO RESPIMAT 2.5-2.5 MCG/ACT AERS INHALE 2 PUFFS INTO THE LUNGS DAILY 09/11/17  Yes Byrum, Rose Fillers,  MD  traMADol (ULTRAM) 50 MG tablet Take 1 tablet (50 mg total) by mouth every 12 (twelve) hours as needed. for pain 09/26/17  Yes Ladell Pier, MD  vitamin B-12 (CYANOCOBALAMIN) 100 MCG tablet Take 100 mcg by mouth daily. Reported on 01/19/2016   Yes [provider]  zolpidem (AMBIEN) 5 MG tablet Take 1 tablet (5 mg total) by mouth at bedtime as needed for sleep. 08/01/17  Yes [name], NP  dicyclomine (BENTYL) 20 MG tablet Take 1 tablet (20 mg total) by mouth 2 (two) times daily. 03/03/18   Haily Caley C, PA-C  doxycycline (VIBRA-TABS) 100 MG tablet Take 100 mg by mouth 2 (two) times daily.    [provider]    Family History Family History  Problem Relation Age of Onset  . Diabetes Father   . Lung cancer Father 72       metastasis to liver  . Cancer Father        liver  . Pancreatic cancer Mother 30  . Stroke Paternal Grandfather   . Heart disease Maternal Aunt   . Diabetes Paternal Uncle   . Heart Problems Maternal Grandmother   . Heart Problems Maternal Grandfather   . Diabetes Paternal Grandmother   . Stroke Paternal Grandmother   . Heart Problems Paternal Uncle   . Cancer Cousin        unknown type  . Breast cancer Cousin        dx. 13s  . Leukemia Cousin        dx. 16-17  . Colon cancer Neg Hx   . Stomach cancer Neg Hx     Social History Social History   Tobacco Use  . Smoking status: Former Smoker    Packs/day: 1.00    Years: 40.00    Pack years: 40.00    Types: Cigarettes    Last attempt to quit: 07/10/2013    Years since quitting: 4.6  . Smokeless tobacco: Never Used  Substance Use Topics  . Alcohol use: Yes    Alcohol/week: 0.0 standard drinks    Comment: 1-2 a week  . Drug use: No     Allergies   Latex and Penicillins   Review of Systems Review of Systems  Constitutional: Negative for chills and fever.  Respiratory: Negative for cough and shortness of breath.   Cardiovascular: Negative for chest pain.  Gastrointestinal:  Positive for abdominal pain, diarrhea, nausea and vomiting. Negative for blood in stool.  Genitourinary: Negative for dysuria, flank pain, hematuria and vaginal bleeding.  Musculoskeletal: Negative  for back pain.  All other systems reviewed and are negative.    Physical Exam Updated Vital Signs BP 132/76 (BP Location: Left Arm)   Pulse (!) 105   Temp 98.2 F (36.8 C)   Resp 18   SpO2 95%   Physical Exam  Constitutional: She appears well-developed and well-nourished. No distress.  HENT:  Head: Normocephalic and atraumatic.  Eyes: Conjunctivae are normal.  Neck: Neck supple.  Cardiovascular: Normal rate, regular rhythm, normal heart sounds and intact distal pulses.  Pulmonary/Chest: Effort normal and breath sounds normal. No respiratory distress.  Abdominal: Soft. Bowel sounds are normal. There is generalized tenderness. There is no guarding.    Patient's tenderness is generalized throughout the abdomen, however, the area she states is most tender is indicated in the picture.  Musculoskeletal: She exhibits no edema.  Lymphadenopathy:    She has no cervical adenopathy.  Neurological: She is alert.  Skin: Skin is warm and dry. She is not diaphoretic.  Psychiatric: She has a normal mood and affect. Her behavior is normal.  Nursing note and vitals reviewed.    ED Treatments / Results  Labs (all labs ordered are listed, but only abnormal results are displayed) Labs Reviewed  COMPREHENSIVE METABOLIC PANEL - Abnormal; Notable for the following components:      Result Value   Glucose, Bld 132 (*)    BUN 31 (*)    Total Protein 6.0 (*)    Albumin 2.6 (*)    GFR calc non Af Amer 55 (*)    All other components within normal limits  CBC WITH DIFFERENTIAL/PLATELET - Abnormal; Notable for the following components:   RBC 3.49 (*)    Hemoglobin 11.2 (*)    HCT 35.1 (*)    MCV 100.6 (*)    RDW 17.8 (*)    Monocytes Absolute 0.0 (*)    All other components within normal limits    LIPASE, BLOOD  I-STAT CG4 LACTIC ACID, ED  I-STAT CG4 LACTIC ACID, ED    EKG None  Radiology Ct Abdomen Pelvis W Contrast  Result Date: 03/03/2018 CLINICAL DATA:  Generalized abdominal pain with vomiting since yesterday. History of ovarian cancer. EXAM: CT ABDOMEN AND PELVIS WITH CONTRAST TECHNIQUE: Multidetector CT imaging of the abdomen and pelvis was performed using the standard protocol following bolus administration of intravenous contrast. CONTRAST:  129mL ISOVUE-300 IOPAMIDOL (ISOVUE-300) INJECTION 61% COMPARISON:  CT abdomen dated 01/04/2018. CT abdomen dated 09/10/2017. FINDINGS: Lower chest: Small bilateral pleural effusions. LEFT pleural effusion is decreased compared to the most recent abdomen CT of 01/04/2018, similar to interval chest CT of 02/23/2018. Hepatobiliary: Perihepatic ascites. Scalloped appearance of the liver margins suggest malignant ascites. No parenchymal mass or lesion seen. Status post cholecystectomy. Pancreas: Unremarkable. No pancreatic ductal dilatation or surrounding inflammatory changes. Spleen: Normal in size without focal abnormality. Adrenals/Urinary Tract: LEFT kidney is absent. RIGHT kidney appears normal without mass, stone or hydronephrosis. Bladder is decompressed. Stomach/Bowel: Prominent thickening of the walls of the small bowel in the RIGHT lower quadrant and upper pelvis. No dilated large or small bowel loops. Diverticulosis of the sigmoid and descending colon but no focal inflammatory change to suggest acute diverticulitis. Vascular/Lymphatic: Aortic atherosclerosis. No enlarged abdominal or pelvic lymph nodes. Again noted are scattered small mesenteric and retroperitoneal lymph nodes. Reproductive: Status post hysterectomy.  No adnexal mass seen. Other: Moderate amount of free fluid within the abdomen or pelvis, suspected malignant ascites. Musculoskeletal: No acute or suspicious osseous findings. IMPRESSION: 1. Prominent thickening of  the walls of the  small bowel loops in the RIGHT lower quadrant and upper pelvis, indicating small bowel enteritis of infectious or inflammatory nature. No bowel obstruction. 2. Moderate amount of free fluid within the abdomen and pelvis. Suspect malignant ascites, given the associated scalloping of the liver margins. No associated omental caking identified. 3. Small bilateral pleural effusions, similar to chest CT of 02/23/2018. 4. LEFT kidney is surgically absent. 5. Colonic diverticulosis without evidence of acute diverticulitis. 6.  Aortic Atherosclerosis (ICD10-I70.0). Electronically Signed   By: Franki Cabot M.D.   On: 03/03/2018 20:22    Procedures Procedures (including critical care time)  Medications Ordered in ED Medications  sodium chloride 0.9 % bolus 1,000 mL ( Intravenous Stopped 03/03/18 2000)  ondansetron (ZOFRAN) injection 4 mg (4 mg Intravenous Given 03/03/18 1915)  morphine 4 MG/ML injection 4 mg (4 mg Intravenous Given 03/03/18 2036)  iopamidol (ISOVUE-300) 61 % injection 100 mL (100 mLs Intravenous Contrast Given 03/03/18 2003)  dicyclomine (BENTYL) capsule 20 mg (20 mg Oral Given 03/03/18 2223)  heparin lock flush 100 unit/mL (500 Units Intracatheter Given 03/03/18 2224)     Initial Impression / Assessment and Plan / ED Course  I have reviewed the triage vital signs and the nursing notes.  Pertinent labs & imaging results that were available during my care of the patient were reviewed by me and considered in my medical decision making (see chart for details).  Clinical Course as of Mar 03 2321  Nancy Fetter Mar 03, 2018  1848 Patient declines analgesics at this time.   [SJ]  2145 Patient tolerated p.o. fluids well.  No additional pain.   [SJ]    Clinical Course User Index [SJ] Yuleimy Kretz C, PA-C    Patient presents with abdominal pain accompanied by nausea and vomiting. Patient is nontoxic appearing, afebrile, not tachycardic on my exam, not tachypneic, not hypotensive, and is in no apparent  distress. Patient's abdomen remained soft throughout ED course. Enteritis noted on CT.  This may be reactive due to the doxycycline, however, due to the lack of severe diarrhea, leukocytosis, or fever, my suspicion for infections, such as C. difficile are low.  Findings and plan of care discussed with Daleen Bo, MD. Dr. Eulis Foster personally evaluated and examined this patient.   Vitals:   03/03/18 1730 03/03/18 2038 03/03/18 2224  BP: 132/76 (!) 126/56 (!) 132/57  Pulse: (!) 105 79 74  Resp: 18 18   Temp: 98.2 F (36.8 C)    SpO2: 95% 100% 100%    Final Clinical Impressions(s) / ED Diagnoses   Final diagnoses:  Periumbilical abdominal pain  Enteritis    ED Discharge Orders         Ordered    dicyclomine (BENTYL) 20 MG tablet  2 times daily     03/03/18 2158           Layla Maw 03/03/18 2322    Daleen Bo, MD 03/07/18 0003

## 2018-03-03 NOTE — ED Provider Notes (Signed)
  Face-to-face evaluation   History: She presents for evaluation of abdominal pain with anorexia, and several episodes of vomiting without blood for 2 days.  Last bowel movement was yesterday morning.  It was somewhat loose at that time.  Denies recent constipation.  She has had several abdominal operative procedures.  Physical exam: Alert elderly female who appears comfortable.  She was examined after receiving morphine.  Abdomen is diffusely tender, mild to moderate.  Normal bowel sounds.  Abdomen soft.  There is no rebound tenderness.   Medical screening examination/treatment/procedure(s) were conducted as a shared visit with non-physician practitioner(s) and myself.  I personally evaluated the patient during the encounter    Daleen Bo, MD 03/07/18 [phone]

## 2018-03-03 NOTE — ED Notes (Signed)
Bed: WA02 Expected date:  Expected time:  Means of arrival:  Comments: 

## 2018-03-03 NOTE — Discharge Instructions (Addendum)
Nausea, vomiting, and abdominal pain  Hand washing: Wash your hands throughout the day, but especially before and after touching the face, using the restroom, sneezing, coughing, or touching surfaces that have been coughed or sneezed upon. Hydration: Symptoms will be intensified and complicated by dehydration. Dehydration can also extend the duration of symptoms. Drink plenty of fluids and get plenty of rest. You should be drinking at least half a liter of water an hour to stay hydrated. Electrolyte drinks (ex. Gatorade, Powerade, Pedialyte) are also encouraged. You should be drinking enough fluids to make your urine light yellow, almost clear. If this is not the case, you are not drinking enough water. Please note that some of the treatments indicated below will not be effective if you are not adequately hydrated. Diet: Please concentrate on hydration, however, you may introduce food slowly.  Start with a clear liquid diet, progressed to a full liquid diet, and then bland solids as you are able. Pain or fever: Ibuprofen, Naproxen, or Tylenol for pain.  May also take your home pain medication. Nausea/vomiting: Use the Zofran for nausea or vomiting. Bentyl: This medication is what is known as an antispasmodic and is intended to help reduce abdominal discomfort. Follow-up: Follow-up with a primary care provider on this matter. Return: Return should you develop a fever, bloody diarrhea, increased abdominal pain, uncontrolled vomiting, or any other major concerns.

## 2018-03-03 NOTE — Assessment & Plan Note (Signed)
Resolved.  Pt is feeling much better since her brief hospitalization.  She has both pulmonary and cardiology f/u next week.  It is unclear how or if her chemo/malignancy is contributing- malignant effusion vs side effect of medication.  Pt has oncology f/u as well.  No need for repeat labs today.  Will continue to follow along

## 2018-03-04 ENCOUNTER — Encounter: Payer: Self-pay | Admitting: Emergency Medicine

## 2018-03-04 ENCOUNTER — Ambulatory Visit (INDEPENDENT_AMBULATORY_CARE_PROVIDER_SITE_OTHER): Payer: Medicare Other | Admitting: Emergency Medicine

## 2018-03-04 DIAGNOSIS — J9611 Chronic respiratory failure with hypoxia: Secondary | ICD-10-CM

## 2018-03-04 DIAGNOSIS — J910 Malignant pleural effusion: Secondary | ICD-10-CM | POA: Diagnosis not present

## 2018-03-04 DIAGNOSIS — I2510 Atherosclerotic heart disease of native coronary artery without angina pectoris: Secondary | ICD-10-CM | POA: Diagnosis not present

## 2018-03-04 DIAGNOSIS — J449 Chronic obstructive pulmonary disease, unspecified: Secondary | ICD-10-CM

## 2018-03-04 NOTE — Assessment & Plan Note (Signed)
Continue current oxygen as ordered.  She is concerned that her portable oxygen concentrator may not be functioning appropriately, is making more noise and vibrating.  We will ask [facility] to evaluate

## 2018-03-04 NOTE — Progress Notes (Signed)
Subjective:    Patient ID: Lynn Ramirez, female    DOB: 01-28-1941, 77 y.o.   MRN: [id number]  COPD  Her past medical history is significant for COPD.   ROV 10/18/17 --77 year old woman with history of ovarian cancer, pleural effusions.  We have followed her for COPD and chronic cough, chronic hypoxemia on 2 L/min oxygen.  She is followed by Dr. Benay Spice.  She has had a rising CA-125 and a CT scan of her abdomen showed some right lower lobe atelectasis versus infiltrate.  I asked her to undergo a CT chest That was done on 4/9.  She is here to review the results today. Her chemo regimen is being changed to carboplatinum by Dr Benay Spice   ROV 03/04/18 --Lynn Ramirez follows up today for her history of COPD, chronic cough, chronic hypoxemic respiratory failure.  She has a history of ovarian cancer that is being treated by Dr. Benay Spice, associated with pleural effusions.  Since I last saw her she had a left-sided thoracentesis that confirmed adenocarcinoma by cytology.  She is on chemo, Gemcitabine. She was just admitted for an apparent acute exacerbation of her COPD 8/16, treated with pred and abx. CT chest from 8/17 was reviewed by me, shows small L effusion, unchanged. She just had a CT abdomen, has more ascites. She has finished the prednisone. She is on stiolto + arnuity, albuterol rarely (although she used more freq during recent apparent flare).    Review of Systems  As per HPI     Objective:   Physical Exam Vitals:   03/04/18 1354  BP: 124/70  Pulse: 83  SpO2: 98%  Weight: 173 lb (78.5 kg)  Height: 5\' 1"  (1.549 m)   Gen: Pleasant, well-nourished, in no distress,  normal affect, on [place] o2  ENT: No lesions,  mouth clear,  oropharynx clear, no postnasal drip, less hoarseness today  Neck: No JVD, no stridor,   Lungs: No use of accessory muscles, no wheezing, slightly decreased L case compared w  R  Cardiovascular: RRR, heart sounds normal, no murmur or gallops, no peripheral edema  Musculoskeletal: No deformities, no cyanosis or clubbing  Neuro: alert, non focal  Skin: Warm, no lesions or rashes      Assessment & Plan:  COPD (chronic obstructive pulmonary disease) (HCC) Possible recent exacerbation although unclear because she was not wheezing, had no cough, etc.  She did improve with prednisone and antibiotics.  Her pleural effusion had not enlarged.  Currently managing her on Stiolto, Arnuity, albuterol as needed.  Her prednisone has been completed.  Chronic respiratory failure with hypoxia (HCC) Continue current oxygen as ordered.  She is concerned that her portable oxygen concentrator may not be functioning appropriately, is making more noise and vibrating.  We will ask [facility] to evaluate  Malignant pleural effusion Metastatic adenocarcinoma, ovarian.  No increase in size in her bilateral pleural effusions on most recent imaging.  Depending on her course she may at some point benefit from a Pleurx.  Baltazar Apo, MD, PhD 03/04/2018, 2:21 PM [facility] [phone] or if no answer [phone]

## 2018-03-04 NOTE — Patient Instructions (Signed)
Please continue Stiolto and Arnuity as you have been using them. Keep albuterol available to use 2 puffs or 1 nebulizer treatment up to every 4 hours if needed for shortness of breath, chest tightness, wheezing. Continue your oxygen as you have been using it.  We will ask [facility] to assess your portable oxygen concentrator since it has not been operating appropriately. Your CT scan of the chest was reviewed.  There has not been any increase in your bilateral pleural effusions. Follow with Dr. Benay Spice as planned. Get the flu shot this fall  Follow with Dr Lamonte Sakai in 4 months or sooner if you have any problems.

## 2018-03-04 NOTE — Assessment & Plan Note (Signed)
Possible recent exacerbation although unclear because she was not wheezing, had no cough, etc.  She did improve with prednisone and antibiotics.  Her pleural effusion had not enlarged.  Currently managing her on Stiolto, Arnuity, albuterol as needed.  Her prednisone has been completed.

## 2018-03-04 NOTE — Assessment & Plan Note (Signed)
Metastatic adenocarcinoma, ovarian.  No increase in size in her bilateral pleural effusions on most recent imaging.  Depending on her course she may at some point benefit from a Pleurx.

## 2018-03-06 ENCOUNTER — Other Ambulatory Visit: Payer: Self-pay | Admitting: Oncology

## 2018-03-06 DIAGNOSIS — C482 Malignant neoplasm of peritoneum, unspecified: Secondary | ICD-10-CM

## 2018-03-07 ENCOUNTER — Other Ambulatory Visit: Payer: Self-pay | Admitting: Family Medicine

## 2018-03-07 ENCOUNTER — Encounter: Payer: Self-pay | Admitting: Cardiology

## 2018-03-07 ENCOUNTER — Ambulatory Visit (INDEPENDENT_AMBULATORY_CARE_PROVIDER_SITE_OTHER): Payer: Medicare Other | Admitting: Cardiology

## 2018-03-07 VITALS — BP 124/70 | HR 73 | Ht 61.0 in | Wt 170.6 lb

## 2018-03-07 DIAGNOSIS — I10 Essential (primary) hypertension: Secondary | ICD-10-CM | POA: Diagnosis not present

## 2018-03-07 DIAGNOSIS — I2510 Atherosclerotic heart disease of native coronary artery without angina pectoris: Secondary | ICD-10-CM | POA: Diagnosis not present

## 2018-03-07 DIAGNOSIS — J441 Chronic obstructive pulmonary disease with (acute) exacerbation: Secondary | ICD-10-CM

## 2018-03-07 DIAGNOSIS — I429 Cardiomyopathy, unspecified: Secondary | ICD-10-CM | POA: Diagnosis not present

## 2018-03-07 DIAGNOSIS — I2583 Coronary atherosclerosis due to lipid rich plaque: Secondary | ICD-10-CM | POA: Diagnosis not present

## 2018-03-07 DIAGNOSIS — Z79899 Other long term (current) drug therapy: Secondary | ICD-10-CM

## 2018-03-07 NOTE — Patient Instructions (Signed)
Medication Instructions:   Your physician recommends that you continue on your current medications as directed. Please refer to the Current Medication list given to you today.     Testing/Procedures:  Your physician has requested that you have an echocardiogram. Echocardiography is a painless test that uses sound waves to create images of your heart. It provides your doctor with information about the size and shape of your heart and how well your heart's chambers and valves are working. This procedure takes approximately one hour. There are no restrictions for this procedure.  PLEASE DO ECHO WITH STRAIN PER DR NELSON--PATIENT ON CHEMOTHERAPY      Follow-Up:  3 MONTHS WITH DR Janelle Floor DR Meda Coffee ADD TO HER QUARTER DAY END SLOT ON 06/13/18 AT 10:40 AM       If you need a refill on your cardiac medications before your next appointment, please call your pharmacy.

## 2018-03-07 NOTE — Progress Notes (Signed)
Cardiology Office Note    Date:  03/07/2018  ID:  Lynn Ramirez, DOB May 24, 1941, MRN [id number] PCP:  Lynn Minium, MD  Cardiologist:  Dr. Meda Ramirez   Chief Complaint: 4 months follow-up  History of Present Illness:  Lynn Ramirez is a 77 y.o. female with history of ovarian cancer with metastasis, COPD with chronic respiratory failure on O2 PRN, HTN, HLD, coronary calcification by CT in 2016. Lexiscan nuclear stress test 04/2015 did not show any significant ischemia. s/p left nephrectomy as a child with CKD stage II, family history of premature CAD. She has had prior recurrent malignant right pleural effusion , s/p thoracentesis, the last one in June 2019.  She is on chronic home O2. Treated by oncology for metastatic ovarian cancer and peritoneal carcinomatosis, now with increased ascites. She has undergone laparoscopic hysterectomy with bilateral salpingoophorectomy, omentectomy, radical tumor debulking, previously on Taxol and Avastin.  We have been following her with regular echocardiograms with strain with stable LVEF of 60 to 65% on the last echo in April 2019.  03/07/2018, this is 4 months follow-up, overall patient is stable her Taxol has been changed to carboplatin by Dr. Learta Ramirez, this has improved her lower extremity edema.  She gets chemotherapy every other week.  She went to the ER 2 weeks ago with acute onset shortness of breath, chest CT showed only small like pleural effusion but increased moderate amount of ascites.  She was given empiric antibiotics for presumed bronchitis.  Her shortness of breath has slightly improved.  Denies any chest pain and has no lower extremity edema. She enjoys life otherwise, she just went to the beach with her grandchildren.  2D echo: 10/2016 showed EF 55-60%, grade 1 DD, indeterminate LV filling pressure 01/03/17: 55-60%, global longitudinal strain -16% 04/19/17: 50-55%, global longitudinal strain -16% 07/2017: LVEF 60-65%, global  longitudinal strain -20% 10/09/2017: LVEF 60-65%, no strain reported  Past Medical History:  Diagnosis Date  . CKD (chronic kidney disease), stage II   . COPD (chronic obstructive pulmonary disease) ([place])   . Coronary artery calcification seen on CT scan   . Difficulty sleeping   . Eczema    hands  . Emphysema   . GERD (gastroesophageal reflux disease)   . H/O hydronephrosis   . History of transfusion    age 59  . Hyperlipidemia   . Hypertension   . Ovarian cancer ([place]) dx'd 09/2014   metastatic - prior malignant R pleural effusion and peritoneal carcinomatosis  . S/p nephrectomy     Past Surgical History:  Procedure Laterality Date  . ABDOMINAL HYSTERECTOMY    . CHOLECYSTECTOMY N/A 08/09/2015   Procedure: Attempted LAPAROSCOPIC coverted  open CHOLECYSTECTOMY WITH INTRAOPERATIVE CHOLANGIOGRAM;  Surgeon: Lynn Messing III, MD;  Location: [facility];  Service: General;  Laterality: N/A;  . HAND SURGERY Right 1980's  . LAPAROTOMY N/A 12/29/2014   Procedure:  LAPAROTOMY;  Surgeon: Lynn Amber, MD;  Location: [facility];  Service: Gynecology;  Laterality: N/A;  . NEPHRECTOMY    . ROBOTIC ASSISTED TOTAL HYSTERECTOMY WITH BILATERAL SALPINGO OOPHERECTOMY Bilateral 12/29/2014   Procedure: ROBOTIC ASSISTED TOTAL LAPAROSCOPIC HYSTERECTOMY WITH BILATERAL SALPINGO OOPHORECTOMY AND OOMENTECTOMY WITH RADICAL TUMOR [place] ;  Surgeon: Lynn Amber, MD;  Location: [facility];  Service: Gynecology;  Laterality: Bilateral;  . THORACENTESIS     several  . TONSILLECTOMY    . TUBAL LIGATION     Current Medications: Current Meds  Medication Sig  . acetaminophen (TYLENOL) 650 MG CR tablet Take 650 mg  by mouth every 8 (eight) hours as needed for pain.  Marland Kitchen albuterol (PROVENTIL) (2.5 MG/3ML) 0.083% nebulizer solution INHALE THE CONTENTS OF 1 VIAL VIA NEBULIZER EVERY 6 HOURS AS NEEDED FOR WHEEZING OR SHORTNESS OF BREATH (Patient taking differently: Take 2.5 mg by nebulization every 6 (six) hours as needed for wheezing or  shortness of breath. )  . antiseptic oral rinse (BIOTENE) LIQD 15 mLs by Mouth Rinse route 3 (three) times daily.  . ARNUITY ELLIPTA 200 MCG/ACT AEPB INHALE 1 PUFF INTO THE LUNGS DAILY (Patient taking differently: Inhale 1 puff into the lungs daily. )  . atorvastatin (LIPITOR) 10 MG tablet TAKE 1 TABLET(10 MG) BY MOUTH DAILY (Patient taking differently: Take 10 mg by mouth daily at 6 PM. )  . Biotin 2500 MCG CAPS Take 1 tablet by mouth daily.  . carvedilol (COREG) 3.125 MG tablet TAKE 1 TABLET(3.125 MG) BY MOUTH TWICE DAILY WITH A MEAL  . cholecalciferol (VITAMIN D) 1000 units tablet Take 1,000 Units by mouth daily.  Marland Kitchen dicyclomine (BENTYL) 20 MG tablet Take 1 tablet (20 mg total) by mouth 2 (two) times daily.  . diphenoxylate-atropine (LOMOTIL) 2.5-0.025 MG tablet TAKE 2 TABLETS BY MOUTH FOUR TIMES DAILY AS NEEDED FOR DIARRHEA/ LOOSE STOOLS  . esomeprazole (NEXIUM) 40 MG capsule Take 40 mg by mouth daily at 12 noon.  . fluocinonide cream (LIDEX) 2.37 % Apply 1 application topically 2 (two) times daily as needed (For ezcema).   . furosemide (LASIX) 40 MG tablet Take 1 tablet (40 mg total) by mouth daily.  . hydrOXYzine (VISTARIL) 25 MG capsule Take 1 capsule (25 mg total) by mouth 4 (four) times daily as needed for itching.  . lidocaine-prilocaine (EMLA) cream Apply to port site one hour prior to use. Do not rub in. Cover with plastic.  Marland Kitchen loratadine (CLARITIN) 10 MG tablet Take 10 mg by mouth daily.  . ondansetron (ZOFRAN-ODT) 4 MG disintegrating tablet Take 1 tablet (4 mg total) by mouth every 8 (eight) hours as needed for nausea or vomiting.  Marland Kitchen oxyCODONE-acetaminophen (PERCOCET/ROXICET) 5-325 MG tablet Take 1 tablet by mouth every 6 (six) hours as needed for severe pain.  Marland Kitchen PROAIR HFA 108 (90 Base) MCG/ACT inhaler INHALE 2 PUFFS INTO THE LUNGS FOUR TIMES DAILY AS NEEDED FOR WHEEZING (Patient taking differently: Inhale 2 puffs into the lungs 4 (four) times daily as needed for wheezing. )  .  pyridoxine (B-6) 100 MG tablet Take 100 mg by mouth daily.  Marland Kitchen STIOLTO RESPIMAT 2.5-2.5 MCG/ACT AERS INHALE 2 PUFFS INTO THE LUNGS DAILY  . traMADol (ULTRAM) 50 MG tablet Take 1 tablet (50 mg total) by mouth every 12 (twelve) hours as needed. for pain  . vitamin B-12 (CYANOCOBALAMIN) 100 MCG tablet Take 100 mcg by mouth daily. Reported on 01/19/2016  . zolpidem (AMBIEN) 5 MG tablet Take 1 tablet (5 mg total) by mouth at bedtime as needed for sleep.  . [DISCONTINUED] gabapentin (NEURONTIN) 300 MG capsule TAKE 1 CAPSULE BY MOUTH EVERY NIGHT AT BEDTIME    Allergies:   Latex and Penicillins   Social History   Socioeconomic History  . Marital status: Married    Spouse name: Not on file  . Number of children: 3  . Years of education: Not on file  . Highest education level: Not on file  Occupational History  . Occupation: RETIRED    Employer: RETIRED  Social Needs  . Financial resource strain: Not hard at all  . Food insecurity:    Worry: Never  true    Inability: Never true  . Transportation needs:    Medical: No    Non-medical: No  Tobacco Use  . Smoking status: Former Smoker    Packs/day: 1.00    Years: 40.00    Pack years: 40.00    Types: Cigarettes    Last attempt to quit: 07/10/2013    Years since quitting: 4.6  . Smokeless tobacco: Never Used  Substance and Sexual Activity  . Alcohol use: Yes    Alcohol/week: 0.0 standard drinks    Comment: 1-2 a week  . Drug use: No  . Sexual activity: Never  Lifestyle  . Physical activity:    Days per week: 0 days    Minutes per session: 0 min  . Stress: Only a little  Relationships  . Social connections:    Talks on phone: More than three times a week    Gets together: More than three times a week    Attends religious service: More than 4 times per year    Active member of club or organization: Yes    Attends meetings of clubs or organizations: More than 4 times per year    Relationship status: Married  Other Topics Concern  .  Not on file  Social History Narrative  . Not on file    Family History:  Family History  Problem Relation Age of Onset  . Diabetes Father   . Lung cancer Father 34       metastasis to liver  . Cancer Father        liver  . Pancreatic cancer Mother 29  . Stroke Paternal Grandfather   . Heart disease Maternal Aunt   . Diabetes Paternal Uncle   . Heart Problems Maternal Grandmother   . Heart Problems Maternal Grandfather   . Diabetes Paternal Grandmother   . Stroke Paternal Grandmother   . Heart Problems Paternal Uncle   . Cancer Cousin        unknown type  . Breast cancer Cousin        dx. 44s  . Leukemia Cousin        dx. 16-17  . Colon cancer Neg Hx   . Stomach cancer Neg Hx    ROS:   Please see the history of present illness. Has rare nosebleeds after chemo but no significant dripping - only minimal blood when wiping that resolves quickly (has been discussed with oncology) All other systems are reviewed and otherwise negative.   PHYSICAL EXAM:   VS:  BP 124/70   Pulse 73   Ht 5\' 1"  (1.549 m)   Wt 170 lb 9.6 oz (77.4 kg)   SpO2 97%   BMI 32.23 kg/m   BMI: Body mass index is 32.23 kg/m. GEN: Well nourished, well developed obese WF, in no acute distress , on O2 via [place] HEENT: normocephalic, atraumatic Neck: no JVD, carotid bruits, or masses Cardiac: RRR; no murmurs, rubs, or gallops, no edema  Respiratory:  Decreased BS in the right base. GI: soft, nontender, nondistended, + BS MS: no deformity or atrophy  Skin: warm and dry, no rash Neuro:  Alert and Oriented x 3, Strength and sensation are intact, follows commands Psych: euthymic mood, full affect  Wt Readings from Last 3 Encounters:  03/07/18 170 lb 9.6 oz (77.4 kg)  03/04/18 173 lb (78.5 kg)  03/01/18 173 lb 12.8 oz (78.8 kg)    Studies/Labs Reviewed:   EKG:  EKG was not done today  Recent  Labs: 12/17/2017: Pro B Natriuretic peptide (BNP) 24.0 02/23/2018: Magnesium 1.4 03/03/2018: ALT 30; BUN 31;  Creatinine, Ser 0.98; Hemoglobin 11.2; Platelets 179; Potassium 4.4; Sodium 141   Lipid Panel    Component Value Date/Time   CHOL 143 08/03/2017 1118   CHOL 135 12/25/2016 0957   TRIG 282.0 (H) 08/03/2017 1118   HDL 26.70 (L) 08/03/2017 1118   HDL 33 (L) 12/25/2016 0957   CHOLHDL 5 08/03/2017 1118   VLDL 56.4 (H) 08/03/2017 1118   LDLCALC 78 12/25/2016 0957   LDLDIRECT 70.0 08/03/2017 1118    Additional studies/ records that were reviewed today include: Summarized above  EKG not performed today.  10/09/2017  - Left ventricle: The cavity size was normal. Systolic function was   normal. The estimated ejection fraction was in the range of 60%   to 65%. Wall motion was normal; there were no regional wall   motion abnormalities. Doppler parameters are consistent with   abnormal left ventricular relaxation (grade 1 diastolic   dysfunction). Doppler parameters are consistent with high   ventricular filling pressure. - Aortic valve: Transvalvular velocity was within the normal range.   There was no stenosis. There was no regurgitation. - Mitral valve: Transvalvular velocity was within the normal range.   There was no evidence for stenosis. There was trivial   regurgitation. - Right ventricle: The cavity size was normal. Wall thickness was   normal. Systolic function was normal. - Tricuspid valve: There was no regurgitation. - Pericardium, extracardiac: A trivial pericardial effusion was   identified.   ASSESSMENT & PLAN:   1. Nonischemic cardiomyopathy - secondary to chemotherapy, she has been on Taxol/ Avastin every other week, switched to carboplatin with some worsening SOB, we will repeat TTE with strain. LVEF normal 60-65 % in 10/2017.  Recurrent malignant pleural effusion, she is advised to take an extra Lasix on days when she feels short of breath. 2. Coronary calcification by CT - no recent anginal symptoms. Lipids controlled. Prior low risk nuc. Continue surveillance for  symptoms.  3. Essential HTN - controlled. 4. Hyperlipidemia - on atorvastatin that is tolerated well. 6.   Ovarian cancer with metastasis, followed by Dr Lynn Ramirez.  Disposition: F/u with Dr. Meda Ramirez in 3 months.  Medication Adjustments/Labs and Tests Ordered: Current medicines are reviewed at length with the patient today.  Concerns regarding medicines are outlined above. Medication changes, Labs and Tests ordered today are summarized above and listed in the Patient Instructions accessible in Encounters.   Signed, Ena Dawley, MD  03/07/2018 11:44 AM    [address], [place], [place]  [address] Phone: [phone]; Fax: [phone]

## 2018-03-10 ENCOUNTER — Other Ambulatory Visit: Payer: Self-pay | Admitting: Oncology

## 2018-03-12 ENCOUNTER — Encounter: Payer: Self-pay | Admitting: Family Medicine

## 2018-03-12 ENCOUNTER — Other Ambulatory Visit: Payer: Self-pay

## 2018-03-12 ENCOUNTER — Ambulatory Visit (INDEPENDENT_AMBULATORY_CARE_PROVIDER_SITE_OTHER): Payer: Medicare Other | Admitting: Family Medicine

## 2018-03-12 VITALS — BP 118/72 | HR 95 | Temp 97.9°F | Resp 17 | Ht 61.0 in | Wt 172.2 lb

## 2018-03-12 DIAGNOSIS — I2510 Atherosclerotic heart disease of native coronary artery without angina pectoris: Secondary | ICD-10-CM | POA: Diagnosis not present

## 2018-03-12 DIAGNOSIS — K529 Noninfective gastroenteritis and colitis, unspecified: Secondary | ICD-10-CM | POA: Diagnosis not present

## 2018-03-12 DIAGNOSIS — R180 Malignant ascites: Secondary | ICD-10-CM | POA: Diagnosis not present

## 2018-03-12 NOTE — Progress Notes (Signed)
   Subjective:    Patient ID: Lynn Ramirez, female    DOB: Oct 02, 1940, 77 y.o.   MRN: [id number]  HPI ER f/u- pt was seen on 8/25 in ER due to abdominal pain that woke her from sleep..  CT scan showed enteritis w/ malignant ascites and stable bilateral pleural effusions.  Labs were stable.  The thought was that the enteritis was due to her abx use.  Was d/c'd on Bentyl.  Pt reports she is currently pain free, BMs are normal.  Pt is now eating normally.   Review of Systems For ROS see HPI     Objective:   Physical Exam  Constitutional: She is oriented to person, place, and time. She appears well-developed and well-nourished. No distress.  HENT:  Head: Normocephalic and atraumatic.  Pulmonary/Chest:  O2 via Letts in place Decreased BS over lower lobes bilaterally, L>R  Abdominal: Soft. She exhibits distension (+ fluid distension). There is tenderness (TTP over RUQ).  Hypoactive BS  Neurological: She is alert and oriented to person, place, and time.  Skin: Skin is warm and dry.  Vitals reviewed.         Assessment & Plan:  Enteritis- new.  Reviewed ER notes, labs, and imaging.  Pt reports abdominal pain has resolved- unless palpated- and she has resumed her normal diet.  No further work up at this time.

## 2018-03-12 NOTE — Assessment & Plan Note (Signed)
According to CT done 8/25 she has malignant ascites present.  She has some TTP over RUQ w/ abdominal distension.  She has appt tomorrow w/ Oncology for them to discuss next steps.  She may need to have this drained and she is fearful that if the fluid is re-accumulating that her chemo is not working.  I certainly understand her fears, but will defer that discussion to oncology tomorrow.

## 2018-03-12 NOTE — Patient Instructions (Signed)
Follow up as needed or as scheduled I am glad that you are seeing Dr Benay Spice tomorrow to discuss the fluid in your belly and decide on the next steps Continue to eat your regular diet Call with any questions or concerns Hang in there!!!

## 2018-03-13 ENCOUNTER — Inpatient Hospital Stay: Payer: Medicare Other

## 2018-03-13 ENCOUNTER — Inpatient Hospital Stay: Payer: Medicare Other | Attending: Nurse Practitioner

## 2018-03-13 ENCOUNTER — Telehealth: Payer: Self-pay | Admitting: Oncology

## 2018-03-13 ENCOUNTER — Inpatient Hospital Stay (HOSPITAL_BASED_OUTPATIENT_CLINIC_OR_DEPARTMENT_OTHER): Payer: Medicare Other | Admitting: Oncology

## 2018-03-13 VITALS — BP 130/59 | HR 81 | Temp 98.1°F | Resp 18 | Ht 61.0 in | Wt 174.3 lb

## 2018-03-13 DIAGNOSIS — Z23 Encounter for immunization: Secondary | ICD-10-CM | POA: Diagnosis not present

## 2018-03-13 DIAGNOSIS — C482 Malignant neoplasm of peritoneum, unspecified: Secondary | ICD-10-CM

## 2018-03-13 DIAGNOSIS — R05 Cough: Secondary | ICD-10-CM | POA: Insufficient documentation

## 2018-03-13 DIAGNOSIS — C5700 Malignant neoplasm of unspecified fallopian tube: Secondary | ICD-10-CM

## 2018-03-13 DIAGNOSIS — Z5111 Encounter for antineoplastic chemotherapy: Secondary | ICD-10-CM | POA: Insufficient documentation

## 2018-03-13 DIAGNOSIS — R188 Other ascites: Secondary | ICD-10-CM

## 2018-03-13 DIAGNOSIS — J90 Pleural effusion, not elsewhere classified: Secondary | ICD-10-CM | POA: Insufficient documentation

## 2018-03-13 DIAGNOSIS — J449 Chronic obstructive pulmonary disease, unspecified: Secondary | ICD-10-CM | POA: Diagnosis not present

## 2018-03-13 DIAGNOSIS — C561 Malignant neoplasm of right ovary: Secondary | ICD-10-CM | POA: Diagnosis not present

## 2018-03-13 DIAGNOSIS — C569 Malignant neoplasm of unspecified ovary: Secondary | ICD-10-CM

## 2018-03-13 DIAGNOSIS — Z95828 Presence of other vascular implants and grafts: Secondary | ICD-10-CM

## 2018-03-13 LAB — CMP (CANCER CENTER ONLY)
ALBUMIN: 2.6 g/dL — AB (ref 3.5–5.0)
ALT: 11 U/L (ref 0–44)
ANION GAP: 10 (ref 5–15)
AST: 14 U/L — AB (ref 15–41)
Alkaline Phosphatase: 71 U/L (ref 38–126)
BUN: 13 mg/dL (ref 8–23)
CO2: 35 mmol/L — AB (ref 22–32)
Calcium: 8.3 mg/dL — ABNORMAL LOW (ref 8.9–10.3)
Chloride: 97 mmol/L — ABNORMAL LOW (ref 98–111)
Creatinine: 0.84 mg/dL (ref 0.44–1.00)
GFR, Estimated: >60 mL/min (ref >60)
GLUCOSE: 115 mg/dL — AB (ref 70–99)
POTASSIUM: 3.2 mmol/L — AB (ref 3.5–5.1)
SODIUM: 142 mmol/L (ref 135–145)
Total Bilirubin: 0.3 mg/dL (ref 0.3–1.2)
Total Protein: 6.6 g/dL (ref 6.5–8.1)

## 2018-03-13 LAB — CBC WITH DIFFERENTIAL (CANCER CENTER ONLY)
BASOS PCT: 0 %
Basophils Absolute: 0 10*3/uL (ref 0.0–0.1)
EOS ABS: 0.1 10*3/uL (ref 0.0–0.5)
EOS PCT: 2 %
HEMATOCRIT: 28.5 % — AB (ref 34.8–46.6)
Hemoglobin: 9 g/dL — ABNORMAL LOW (ref 11.6–15.9)
Lymphocytes Relative: 12 %
Lymphs Abs: 0.8 10*3/uL — ABNORMAL LOW (ref 0.9–3.3)
MCH: 31.3 pg (ref 25.1–34.0)
MCHC: 31.6 g/dL (ref 31.5–36.0)
MCV: 99 fL (ref 79.5–101.0)
MONO ABS: 0.6 10*3/uL (ref 0.1–0.9)
Monocytes Relative: 9 %
Neutro Abs: 5.2 10*3/uL (ref 1.5–6.5)
Neutrophils Relative %: 77 %
PLATELETS: 279 10*3/uL (ref 145–400)
RBC: 2.88 MIL/uL — ABNORMAL LOW (ref 3.70–5.45)
RDW: 18.4 % — AB (ref 11.2–14.5)
WBC Count: 6.8 10*3/uL (ref 3.9–10.3)

## 2018-03-13 MED ORDER — SODIUM CHLORIDE 0.9 % IV SOLN
640.0000 mg/m2 | Freq: Once | INTRAVENOUS | Status: AC
  Administered 2018-03-13: 1178 mg via INTRAVENOUS
  Filled 2018-03-13: qty 30.98

## 2018-03-13 MED ORDER — PROCHLORPERAZINE MALEATE 10 MG PO TABS
ORAL_TABLET | ORAL | Status: AC
  Filled 2018-03-13: qty 1

## 2018-03-13 MED ORDER — SODIUM CHLORIDE 0.9 % IV SOLN
Freq: Once | INTRAVENOUS | Status: AC
  Administered 2018-03-13: 10:00:00 via INTRAVENOUS
  Filled 2018-03-13: qty 250

## 2018-03-13 MED ORDER — HEPARIN SOD (PORK) LOCK FLUSH 100 UNIT/ML IV SOLN
500.0000 [IU] | Freq: Once | INTRAVENOUS | Status: AC | PRN
  Administered 2018-03-13: 500 [IU]
  Filled 2018-03-13: qty 5

## 2018-03-13 MED ORDER — PROCHLORPERAZINE MALEATE 10 MG PO TABS
10.0000 mg | ORAL_TABLET | Freq: Once | ORAL | Status: AC
  Administered 2018-03-13: 10 mg via ORAL

## 2018-03-13 MED ORDER — SODIUM CHLORIDE 0.9 % IJ SOLN
10.0000 mL | INTRAMUSCULAR | Status: DC | PRN
  Administered 2018-03-13: 10 mL via INTRAVENOUS
  Filled 2018-03-13: qty 10

## 2018-03-13 MED ORDER — SODIUM CHLORIDE 0.9% FLUSH
10.0000 mL | INTRAVENOUS | Status: DC | PRN
  Administered 2018-03-13: 10 mL
  Filled 2018-03-13: qty 10

## 2018-03-13 NOTE — Telephone Encounter (Signed)
Appts scheduled/ IB message to [facility] regarding 9/11 appt and if possible to move it to 9/13 per 9/4 los

## 2018-03-13 NOTE — Progress Notes (Signed)
[facility] OFFICE PROGRESS NOTE   Diagnosis: Fallopian tube carcinoma  INTERVAL HISTORY:   Lynn Ramirez returns as scheduled.  He completed another treatment with gemcitabine on 02/27/2018.  She was seen in the emergency room with nausea/vomiting and abdominal pain on 03/03/2018.  A CTA revealed perihepatic ascites, prominent wall thickening of small bowel in the right lower quadrant and upper pelvis, no dilated large or small bowel loops.  No evidence of diverticulitis.  Moderate amount of free fluid in the abdomen and pelvis.  No omental caking.  Small bilateral pleural effusions.  She followed a liquid diet and took Bentyl for a few days.  She now feels better.  He reports increased dyspnea in the mornings.  She has noted increased abdominal distention. Objective:  Vital signs in last 24 hours:  Blood pressure (!) 130/59, pulse 81, temperature 98.1 F (36.7 C), temperature source Oral, resp. rate 18, height '5\' 1"'  (1.549 m), weight 174 lb 4.8 oz (79.1 kg), SpO2 100 %.    HEENT: No thrush or ulcers Resp: Distant breath sounds, no respiratory distress Cardio: Regular rate and rhythm GI: Mildly distended, tender in the left lower abdomen, no mass Vascular: No leg edema   Portacath/PICC-without erythema  Lab Results:  Lab Results  Component Value Date   WBC 6.8 03/13/2018   HGB 9.0 (L) 03/13/2018   HCT 28.5 (L) 03/13/2018   MCV 99.0 03/13/2018   PLT 279 03/13/2018   NEUTROABS 5.2 03/13/2018    CMP  Lab Results  Component Value Date   NA 142 03/13/2018   K 3.2 (L) 03/13/2018   CL 97 (L) 03/13/2018   CO2 35 (H) 03/13/2018   GLUCOSE 115 (H) 03/13/2018   BUN 13 03/13/2018   CREATININE 0.84 03/13/2018   CALCIUM 8.3 (L) 03/13/2018   PROT 6.6 03/13/2018   ALBUMIN 2.6 (L) 03/13/2018   AST 14 (L) 03/13/2018   ALT 11 03/13/2018   ALKPHOS 71 03/13/2018   BILITOT 0.3 03/13/2018   GFRNONAA >60 03/13/2018   GFRAA >60 03/13/2018    Medications: I have  reviewed the patient's current medications.   Assessment/Plan: 1. 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 Significant improvement in malignant right pleural effusion.  Status post robotic-assisted laparoscopic hysterectomy with bilateral salpingoophorectomy, omentectomy, radical tumor debulking 12/29/2014. Per Dr. Serita Grit office note 01/14/2015 cytoreduction was optimal with residual disease remaining only in the 1 mm implants on the small intestine. 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  MSI-stable, mutation burden-4, BRAF rearrangement,ERBB4, KRAS G12D Cycle 1 adjuvant Taxol/carboplatin 01/20/2015 Cycle 2 adjuvant Taxol/carboplatin 02/10/2015 Cycle 3 adjuvant Taxol/carboplatin 03/10/2015 CA 125 on 1013 2016-42  CTs of the chest, abdomen, and  pelvis 05/31/2015 with no evidence of progressive ovarian  cancer  CTs of the chest, abdomen, and pelvis 08/07/2015 and 08/08/2015-no evidence of progressive ovarian cancer  Peritoneal studding noted at the time of the cholecystectomy procedure 08/09/2015 Elevated CA 125 10/07/2015  CT 10/07/2015 with stricturing at the sigmoid colon, constipation, and omental nodularity  Initiation of salvage weekly Taxol chemotherapy 10/13/2015  Taxol changed to every 2 weeks beginning 01/19/2016 due to painful neuropathy.  CT scans 07/19/2016 with no acute findings. No features in the abdomen or pelvis to suggest recurrent disease.Stable mild fullness right intrarenal collecting system.  Elevated CA 125 treatment resumed with Taxol/Avastin on a 2 week schedule 09/27/2016  CT 02/12/2017-no evidence of carcinomatosis, no evidence of progressive metastatic disease  Taxol/Avastin continued every 2 weeks  CT 09/10/2017-increase in trace ascites, no evidence of progressive carcinomatosis, inflammatory changes at the lung bases with a new 3 mm right lower lobe nodule  Taxol/Avastin continued every 2 weeks  Progressive rise in the CA 125  Cycle 1 carboplatin 3-week schedule4/17/2019  Cycle 2 carboplatin 11/14/2017  Progressive malignant left pleural effusion June 2019  CT 01/07/2018-resolution of pulmonary nodule seen on prior examination. New small bilateral pleural effusions left greater than right with areas of atelectasis. Stable scattered mediastinal and hilar lymph nodes some partially calcified. Stable emphysematous changes and areas of pulmonary scarring. Small volume abdominal ascites but no omental or peritoneal surface disease. Small scattered mesenteric and retroperitoneal lymph nodes stable. Stable nodularity left adrenal gland.  Cycle 1 gemcitabine 01/09/2018 (day 1/day 8 schedule)  Cycle 2 gemcitabine 01/30/2018  Cycle 3 gemcitabine 02/21/2018  CT 03/03/2018- moderate free fluid in  the abdomen and pelvis, thickened walls of small bowel loops in the right lower quadrant  Cycle 4 gemcitabine 03/13/2018  CT 03/03/2018-small bilateral pleural effusions, moderate ascites, small bowel wall thickening in the right lower quadrant 2. COPD-followed by Dr. Lamonte Sakai. 3. Dyspnea secondary to COPD and the large right pleural effusion, status post therapeutic thoracentesis procedures 09/30/2014,10/09/2014, and 10/21/2014. Left thoracentesis 10/30/2014  Progressive left pleural effusion noted on chest x-ray 12/17/2017  Left thoracentesis 12/19/2017, cytology positive for metastatic adenocarcinoma 4. Left nephrectomy as a child 5. Delayed nausea following cycle 1 Taxol/carboplatin, Aloxi/Emend added with cycle 2 with improvement. 6. Right lower extremity edema, right calf pain 12/09/2014. Negative venous Doppler 12/09/2014. 7. Diffuse pruritus following cycle 1 adjuvant Taxol/carboplatin 01/20/2015, no rash, resolved with a steroid dose pack 8. Thrombocytopenia second to chemotherapy, the carboplatin was dose reduced with cycle 2 adjuvant Taxol/carboplatin 02/10/2015 9. Chemotherapy-induced peripheral neuropathy-painful peripheral neuropathy involving the feet 01/19/2016.  10. Admission with acute cholecystitis 08/07/2015, status post a cholecystectomy 08/09/2015 11. Admission 10/08/2015 with abdominal pain/constipation, improved with bowel rest and laxatives 12. Mild right hydronephrosis noted on the CT 10/07/2015. Renal ultrasound 12/16/2015 with no hydronephrosis noted.No hydronephrosis on CT 07/19/2016. 13. Anemia secondary to chronic disease and chemotherapy, status post a red cell transfusion 12/05/2017 14. Admission 02/22/2018 with increased dyspnea     Disposition: Lynn Ramirez has completed 3 treatments with gemcitabine.  Her overall status is unchanged, but she has developed increased ascites.  She had an episode of acute nausea/vomiting last week.  This may have been related to  transient bowel obstruction.  I suspect she has progressive carcinomatosis.  I reviewed the 03/03/2018 CT images with Lynn Ramirez.  We decided to proceed with gemcitabine today.  We will follow-up on the CA 125 from today.  She will return for an office visit prior to day 8 chemotherapy next week.  We will refer her for  a diagnostic/therapeutic paracentesis if the ascites progresses.  25 minutes were spent with the patient today.  The majority of the time was used for counseling and coordination of care.  Betsy Coder, MD  03/13/2018  4:05 PM

## 2018-03-13 NOTE — Patient Instructions (Signed)
[facility] Discharge Instructions for Patients Receiving Chemotherapy  Today you received the following chemotherapy agents gemzar.  To help prevent nausea and vomiting after your treatment, we encourage you to take your nausea medication as directed.   If you develop nausea and vomiting that is not controlled by your nausea medication, call the clinic.   BELOW ARE SYMPTOMS THAT SHOULD BE REPORTED IMMEDIATELY:  *FEVER GREATER THAN 100.5 F  *CHILLS WITH OR WITHOUT FEVER  NAUSEA AND VOMITING THAT IS NOT CONTROLLED WITH YOUR NAUSEA MEDICATION  *UNUSUAL SHORTNESS OF BREATH  *UNUSUAL BRUISING OR BLEEDING  TENDERNESS IN MOUTH AND THROAT WITH OR WITHOUT PRESENCE OF ULCERS  *URINARY PROBLEMS  *BOWEL PROBLEMS  UNUSUAL RASH Items with * indicate a potential emergency and should be followed up as soon as possible.  Feel free to call the clinic should you have any questions or concerns. The clinic phone number is [phone].  Please show the CHEMO ALERT CARD at check-in to the Emergency Department and triage nurse.   

## 2018-03-14 LAB — CA 125: CANCER ANTIGEN (CA) 125: 176 U/mL — AB (ref 0.0–38.1)

## 2018-03-15 ENCOUNTER — Ambulatory Visit: Payer: Medicare Other

## 2018-03-15 ENCOUNTER — Other Ambulatory Visit: Payer: Self-pay | Admitting: Oncology

## 2018-03-15 ENCOUNTER — Telehealth: Payer: Self-pay | Admitting: *Deleted

## 2018-03-15 ENCOUNTER — Ambulatory Visit: Payer: Medicare Other | Admitting: Family Medicine

## 2018-03-15 DIAGNOSIS — C5700 Malignant neoplasm of unspecified fallopian tube: Secondary | ICD-10-CM

## 2018-03-15 MED ORDER — POTASSIUM CHLORIDE ER 20 MEQ PO TBCR: 10.0000 meq | EXTENDED_RELEASE_TABLET | Freq: Every day | ORAL | 0 refills | Status: DC

## 2018-03-15 NOTE — Telephone Encounter (Signed)
Telephone call to patient discussed lab results. Patient understands to pick up potassium at her pharmacy. BMP ordered and lab added to appts next week.

## 2018-03-20 ENCOUNTER — Inpatient Hospital Stay: Payer: Medicare Other

## 2018-03-20 ENCOUNTER — Ambulatory Visit (HOSPITAL_COMMUNITY)
Admission: RE | Admit: 2018-03-20 | Discharge: 2018-03-20 | Disposition: A | Discharge status: Home / Self Care | Payer: Medicare Other | Source: Ambulatory Visit | Attending: Oncology | Admitting: Oncology

## 2018-03-20 ENCOUNTER — Ambulatory Visit: Payer: Medicare Other

## 2018-03-20 ENCOUNTER — Telehealth: Payer: Self-pay | Admitting: Oncology

## 2018-03-20 ENCOUNTER — Other Ambulatory Visit: Payer: Medicare Other

## 2018-03-20 ENCOUNTER — Inpatient Hospital Stay (HOSPITAL_BASED_OUTPATIENT_CLINIC_OR_DEPARTMENT_OTHER): Payer: Medicare Other | Admitting: Oncology

## 2018-03-20 VITALS — BP 127/56 | HR 86 | Temp 97.6°F | Resp 17 | Wt 175.8 lb

## 2018-03-20 DIAGNOSIS — C5700 Malignant neoplasm of unspecified fallopian tube: Secondary | ICD-10-CM

## 2018-03-20 DIAGNOSIS — R05 Cough: Secondary | ICD-10-CM | POA: Diagnosis not present

## 2018-03-20 DIAGNOSIS — Z23 Encounter for immunization: Secondary | ICD-10-CM

## 2018-03-20 DIAGNOSIS — J449 Chronic obstructive pulmonary disease, unspecified: Secondary | ICD-10-CM

## 2018-03-20 DIAGNOSIS — R918 Other nonspecific abnormal finding of lung field: Secondary | ICD-10-CM | POA: Diagnosis not present

## 2018-03-20 DIAGNOSIS — J9621 Acute and chronic respiratory failure with hypoxia: Secondary | ICD-10-CM | POA: Diagnosis not present

## 2018-03-20 DIAGNOSIS — R8569 Abnormal cytological findings in specimens from other digestive organs and abdominal cavity: Secondary | ICD-10-CM | POA: Diagnosis not present

## 2018-03-20 DIAGNOSIS — C561 Malignant neoplasm of right ovary: Secondary | ICD-10-CM

## 2018-03-20 DIAGNOSIS — J90 Pleural effusion, not elsewhere classified: Secondary | ICD-10-CM | POA: Diagnosis not present

## 2018-03-20 DIAGNOSIS — C786 Secondary malignant neoplasm of retroperitoneum and peritoneum: Secondary | ICD-10-CM | POA: Diagnosis not present

## 2018-03-20 DIAGNOSIS — E872 Acidosis: Secondary | ICD-10-CM | POA: Diagnosis not present

## 2018-03-20 DIAGNOSIS — J441 Chronic obstructive pulmonary disease with (acute) exacerbation: Secondary | ICD-10-CM | POA: Diagnosis not present

## 2018-03-20 DIAGNOSIS — R188 Other ascites: Secondary | ICD-10-CM | POA: Insufficient documentation

## 2018-03-20 DIAGNOSIS — C569 Malignant neoplasm of unspecified ovary: Secondary | ICD-10-CM | POA: Insufficient documentation

## 2018-03-20 DIAGNOSIS — C562 Malignant neoplasm of left ovary: Secondary | ICD-10-CM | POA: Diagnosis not present

## 2018-03-20 DIAGNOSIS — J159 Unspecified bacterial pneumonia: Secondary | ICD-10-CM | POA: Diagnosis not present

## 2018-03-20 DIAGNOSIS — R0602 Shortness of breath: Secondary | ICD-10-CM | POA: Diagnosis not present

## 2018-03-20 LAB — CBC WITH DIFFERENTIAL (CANCER CENTER ONLY)
BASOS ABS: 0 10*3/uL (ref 0.0–0.1)
Basophils Relative: 0 %
EOS ABS: 0.1 10*3/uL (ref 0.0–0.5)
Eosinophils Relative: 1 %
HCT: 29.4 % — ABNORMAL LOW (ref 34.8–46.6)
HEMOGLOBIN: 9.1 g/dL — AB (ref 11.6–15.9)
LYMPHS ABS: 0.7 10*3/uL — AB (ref 0.9–3.3)
LYMPHS PCT: 19 %
MCH: 31.1 pg (ref 25.1–34.0)
MCHC: 31 g/dL — ABNORMAL LOW (ref 31.5–36.0)
MCV: 100.3 fL (ref 79.5–101.0)
Monocytes Absolute: 0.8 10*3/uL (ref 0.1–0.9)
Monocytes Relative: 23 %
NEUTROS ABS: 1.9 10*3/uL (ref 1.5–6.5)
NEUTROS PCT: 57 %
NRBC: 1 /100{WBCs} — AB
PLATELETS: 313 10*3/uL (ref 145–400)
RBC: 2.93 MIL/uL — ABNORMAL LOW (ref 3.70–5.45)
RDW: 18.8 % — ABNORMAL HIGH (ref 11.2–14.5)
Smear Review: 3
WBC Count: 3.5 10*3/uL — ABNORMAL LOW (ref 3.9–10.3)

## 2018-03-20 LAB — GRAM STAIN: Gram Stain: NONE SEEN

## 2018-03-20 LAB — BASIC METABOLIC PANEL - CANCER CENTER ONLY
ANION GAP: 11 (ref 5–15)
BUN: 10 mg/dL (ref 8–23)
CALCIUM: 9.2 mg/dL (ref 8.9–10.3)
CHLORIDE: 97 mmol/L — AB (ref 98–111)
CO2: 34 mmol/L — AB (ref 22–32)
Creatinine: 0.89 mg/dL (ref 0.44–1.00)
GFR, Est AFR Am: >60 mL/min (ref >60)
GFR, Estimated: >60 mL/min (ref >60)
GLUCOSE: 102 mg/dL — AB (ref 70–99)
POTASSIUM: 4.2 mmol/L (ref 3.5–5.1)
Sodium: 142 mmol/L (ref 135–145)

## 2018-03-20 MED ORDER — LIDOCAINE HCL 1 % IJ SOLN
INTRAMUSCULAR | Status: AC
  Filled 2018-03-20: qty 10

## 2018-03-20 MED ORDER — INFLUENZA VAC SPLIT QUAD 0.5 ML IM SUSY
0.5000 mL | PREFILLED_SYRINGE | Freq: Once | INTRAMUSCULAR | Status: AC
  Administered 2018-03-20: 0.5 mL via INTRAMUSCULAR

## 2018-03-20 MED ORDER — INFLUENZA VAC SPLIT QUAD 0.5 ML IM SUSY
PREFILLED_SYRINGE | INTRAMUSCULAR | Status: AC
  Filled 2018-03-20: qty 0.5

## 2018-03-20 NOTE — Progress Notes (Signed)
[place] OFFICE PROGRESS NOTE   Diagnosis: Fallopian tube carcinoma  INTERVAL HISTORY:   Lynn Ramirez returns as scheduled.  She completed another treatment with gemcitabine on 03/13/2018.  She has noted increased abdominal distention.  She has increased leg edema.  She burped and regurgitated 03/17/2018 and developed a cough.  No fever.  Intermittent dyspnea.  Objective:  Vital signs in last 24 hours:  Blood pressure (!) 127/56, pulse 86, temperature 97.6 F (36.4 C), temperature source Oral, resp. rate 17, weight 175 lb 12.8 oz (79.7 kg), SpO2 93 %, peak flow (!) 2 L/min.    HEENT: No thrush Resp: Breath sounds, inspiratory rhonchi at the left base, no respiratory distress Cardio: Regular rate and rhythm GI: Distended, diffuse mild tenderness Vascular: Trace edema at the left greater than right lower leg    Portacath/PICC-without erythema  Lab Results:  Lab Results  Component Value Date   WBC 3.5 (L) 03/20/2018   HGB 9.1 (L) 03/20/2018   HCT 29.4 (L) 03/20/2018   MCV 100.3 03/20/2018   PLT 313 03/20/2018   NEUTROABS 1.9 03/20/2018    CMP  Lab Results  Component Value Date   NA 142 03/20/2018   K 4.2 03/20/2018   CL 97 (L) 03/20/2018   CO2 34 (H) 03/20/2018   GLUCOSE 102 (H) 03/20/2018   BUN 10 03/20/2018   CREATININE 0.89 03/20/2018   CALCIUM 9.2 03/20/2018   PROT 6.6 03/13/2018   ALBUMIN 2.6 (L) 03/13/2018   AST 14 (L) 03/13/2018   ALT 11 03/13/2018   ALKPHOS 71 03/13/2018   BILITOT 0.3 03/13/2018   GFRNONAA >60 03/20/2018   GFRAA >60 03/20/2018     Medications: I have reviewed the patient's current medications.   Assessment/Plan: 1. 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 Significant improvement in malignant right pleural effusion.  Status post robotic-assisted laparoscopic hysterectomy with bilateral salpingoophorectomy, omentectomy, radical tumor debulking 12/29/2014. Per Dr. Serita Grit office note 01/14/2015 cytoreduction was optimal with residual disease remaining only in the 1 mm implants on the small intestine. 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  MSI-stable, mutation burden-4, BRAF rearrangement,ERBB4, KRAS G12D Cycle 1 adjuvant Taxol/carboplatin 01/20/2015 Cycle 2 adjuvant Taxol/carboplatin 02/10/2015 Cycle 3 adjuvant Taxol/carboplatin 03/10/2015 CA 125 on 1013 2016-42  CTs of the chest, abdomen, and pelvis 05/31/2015 with no evidence of progressive ovarian cancer  CTs of the chest, abdomen, and pelvis 08/07/2015 and 08/08/2015-no evidence of progressive ovarian cancer  Peritoneal studding noted at the time of the cholecystectomy procedure  08/09/2015 Elevated CA 125 10/07/2015  CT 10/07/2015 with stricturing at the sigmoid colon, constipation, and omental nodularity  Initiation of salvage weekly Taxol chemotherapy 10/13/2015  Taxol changed to every 2 weeks beginning 01/19/2016 due to  painful neuropathy.  CT scans 07/19/2016 with no acute findings. No features in the abdomen or pelvis to suggest recurrent disease.Stable mild fullness right intrarenal collecting system.  Elevated CA 125 treatment resumed with Taxol/Avastin on a 2 week schedule 09/27/2016  CT 02/12/2017-no evidence of carcinomatosis, no evidence of progressive metastatic disease  Taxol/Avastin continued every 2 weeks  CT 09/10/2017-increase in trace ascites, no evidence of progressive carcinomatosis, inflammatory changes at the lung bases with a new 3 mm right lower lobe nodule  Taxol/Avastin continued every 2 weeks  Progressive rise in the CA 125  Cycle 1 carboplatin 3-week schedule4/17/2019  Cycle 2 carboplatin 11/14/2017  Progressive malignant left pleural effusion June 2019  CT 01/07/2018-resolution of pulmonary nodule seen on prior examination. New small bilateral pleural effusions left greater than right with areas of atelectasis. Stable scattered mediastinal and hilar lymph nodes some partially calcified. Stable emphysematous changes and areas of pulmonary scarring. Small volume abdominal ascites but no omental or peritoneal surface disease. Small scattered mesenteric and retroperitoneal lymph nodes stable. Stable nodularity left adrenal gland.  Cycle 1 gemcitabine 01/09/2018 (day 1/day 8 schedule)  Cycle 2 gemcitabine 01/30/2018  Cycle 3 gemcitabine 02/21/2018  CT 03/03/2018- moderate free fluid in the abdomen and pelvis, thickened walls of small bowel loops in the right lower quadrant  Cycle 4 gemcitabine 03/13/2018  CT 03/03/2018-small bilateral pleural effusions, moderate ascites, small bowel wall thickening in the right lower  quadrant 2. COPD-followed by Dr. Lamonte Sakai. 3. Dyspnea secondary to COPD and the large right pleural effusion, status post therapeutic thoracentesis procedures 09/30/2014,10/09/2014, and 10/21/2014. Left thoracentesis 10/30/2014  Progressive left pleural effusion noted on chest x-ray 12/17/2017  Left thoracentesis 12/19/2017, cytology positive for metastatic adenocarcinoma 4. Left nephrectomy as a child 5. Delayed nausea following cycle 1 Taxol/carboplatin, Aloxi/Emend added with cycle 2 with improvement. 6. Right lower extremity edema, right calf pain 12/09/2014. Negative venous Doppler 12/09/2014. 7. Diffuse pruritus following cycle 1 adjuvant Taxol/carboplatin 01/20/2015, no rash, resolved with a steroid dose pack 8. Thrombocytopenia second to chemotherapy, the carboplatin was dose reduced with cycle 2 adjuvant Taxol/carboplatin 02/10/2015 9. Chemotherapy-induced peripheral neuropathy-painful peripheral neuropathy involving the feet 01/19/2016.  10. Admission with acute cholecystitis 08/07/2015, status post a cholecystectomy 08/09/2015 11. Admission 10/08/2015 with abdominal pain/constipation, improved with bowel rest and laxatives 12. Mild right hydronephrosis noted on the CT 10/07/2015. Renal ultrasound 12/16/2015 with no hydronephrosis noted.No hydronephrosis on CT 07/19/2016. 13. Anemia secondary to chronic disease and chemotherapy, status post a red cell transfusion 12/05/2017 14. Admission 02/22/2018 with increased dyspnea    Disposition: Ms. Better has developed progressive ascites while on gemcitabine.  We decided to hold gemcitabine today.  She will be referred for a diagnostic/therapeutic paracentesis today.  She will also have a chest x-ray to follow-up on the left effusion and cough.  She will return for an office visit on 03/26/2018.  She received an influenza vaccine today.    Betsy Coder, MD  03/20/2018  12:01 PM

## 2018-03-20 NOTE — Telephone Encounter (Signed)
Gave pt avs and calendar  °

## 2018-03-20 NOTE — Procedures (Signed)
Ultrasound-guided diagnostic and therapeutic paracentesis performed yielding 950 cc of hazy, yellow fluid. No immediate complications.  The fluid was sent to the lab for preordered studies.

## 2018-03-21 ENCOUNTER — Ambulatory Visit (INDEPENDENT_AMBULATORY_CARE_PROVIDER_SITE_OTHER): Payer: Medicare Other | Admitting: Physical Therapy

## 2018-03-21 DIAGNOSIS — R2689 Other abnormalities of gait and mobility: Secondary | ICD-10-CM

## 2018-03-21 DIAGNOSIS — M6281 Muscle weakness (generalized): Secondary | ICD-10-CM

## 2018-03-22 ENCOUNTER — Encounter: Payer: Self-pay | Admitting: Pulmonary Disease

## 2018-03-22 ENCOUNTER — Ambulatory Visit (INDEPENDENT_AMBULATORY_CARE_PROVIDER_SITE_OTHER): Payer: Medicare Other | Admitting: Pulmonary Disease

## 2018-03-22 VITALS — BP 122/68 | HR 92 | Temp 98.1°F | Ht 61.0 in | Wt 171.8 lb

## 2018-03-22 DIAGNOSIS — J449 Chronic obstructive pulmonary disease, unspecified: Secondary | ICD-10-CM

## 2018-03-22 DIAGNOSIS — J9611 Chronic respiratory failure with hypoxia: Secondary | ICD-10-CM

## 2018-03-22 MED ORDER — LEVALBUTEROL HCL 0.63 MG/3ML IN NEBU
0.6300 mg | INHALATION_SOLUTION | Freq: Once | RESPIRATORY_TRACT | Status: AC
  Administered 2018-03-22: 0.63 mg via RESPIRATORY_TRACT

## 2018-03-22 MED ORDER — PREDNISONE 10 MG PO TABS: ORAL_TABLET | ORAL | 0 refills | Status: DC

## 2018-03-22 MED ORDER — AZITHROMYCIN 250 MG PO TABS: ORAL_TABLET | ORAL | 0 refills | Status: DC

## 2018-03-22 NOTE — Assessment & Plan Note (Signed)
Xopenex neb today   Azithromycin 250mg  tablet  >>>Take 2 tablets (500mg  total) today, and then 1 tablet (250mg ) for the next four days  >>>take with food  >>>can also take probiotic and / or yogurt while on antibiotic   Prednisone 10mg  tablet  >>>Take 2 tablets (20 mg total) daily for the next 5 days >>> Take with food in the morning  Stiolto Respimat inhaler >>>2 puffs daily >>>Take this no matter what >>>This is not a rescue inhaler  Continue Arnuity  Do not believe patient needs chest x-ray today  Continue oxygen therapy as prescribed >>>Continue to ensure oxygen levels remain greater than 90% if not please contact our office  Keep follow-up with Dr. Lamonte Sakai December 2019/January 2020

## 2018-03-22 NOTE — Progress Notes (Signed)
@Patient  ID: Lynn Ramirez, female    DOB: 1940-12-30, 77 y.o.   MRN: [id number]  Chief Complaint  Patient presents with  . Acute Visit    COPD, cough, sob    Referring provider: Midge Minium, MD  HPI:  77 year old female former smoker (quit 2017) 40 pack years.  Followed in our office for GOLD COPD III (2013 - PFT), chronic cough, pleural effusions, chronic hypoxemia on 2 L O2  PMH: Hypertension, CAD, GERD, eczema, ovarian cancer, chronic kidney disease stage II Smoker/ Smoking History: Former smoker.  40 pack years. Maintenance: Stiolto and Arnuity Pt of: Dr. Lamonte Sakai   03/22/2018  - Visit   Pleasant 77 year old patient reporting today for acute visit. Patient reports that she has had increasing shortness of breath, and sputum production chest few days.  Patient does admit sputum does not have a color change to it is just white thick sputum but she is definitely having more of it.  Patient reports adherence to [facility] as well as Arnuity.  Patient had to use nebulizers 2 times in the last 24 hours to help with when laying flat and wheezing.  Patient recently completed a chest x-ray 03/19/2018.  Chest x-ray was normal.  Patient denies orthopnea fevers, hemoptysis.   Tests:   03/20/2018-chest x-ray-COPD no active acute disease 10/16/17-CT chest without contrast- several 2-4 nodular opacities throughout lungs, follow-up CT is recommended 10/09/2017-echocardiogram-LV ejection fraction 60 to 40%, grade 1 diastolic dysfunction  98/05/9146-[id number] function test- FVC 76%, postbronchodilator ratio 41, FEV1 44, DLCO 71 >>>Severe airflow obstruction no response to bronchodilator, DLCO is mildly reduced  Chart Review:     Specialty Problems      Pulmonary Problems   Malignant pleural effusion    CXR 09/2014: moderate right effusion CXR 12/2017 left effusion >cytology c/w metastatic adenocarcinoma >ov with Dr. Benay Spice follow up .       Chronic respiratory failure with hypoxia  (HCC)    O2 2L cont> d/c'd May 2016   SATURATION QUALIFICATIONS: 09/28/14  Patient Saturations on Room Air at Rest = 85% - 06/16/2015   Walked RA  2 laps @ 185 ft each stopped due to  Sob/ desats to 83 brisk pace  - 06/23/2015  Walked RA x 3 laps @ 185 ft each stopped due to  Leg pain, nl pace, sats still 90%       Pulmonary nodules/lesions, multiple   COPD GOLD III B    05/06/2012-pulmonary function test- FVC 76%, postbronchodilator ratio 41, FEV1 44, DLCO 71 >>>Severe airflow obstruction no response to bronchodilator, DLCO is mildly reduced       Allergic rhinitis   Chronic cough   On home oxygen therapy      Allergies  Allergen Reactions  . Latex Rash    Latex gloves ONLY (pt cannot wear them- no reaction if someone else touches her with latex gloves on)  . Penicillins Other (See Comments)    welts Has patient had a PCN reaction causing immediate rash, facial/tongue/throat swelling, SOB or lightheadedness with hypotension: Yes  Has patient had a PCN reaction causing severe rash involving mucus membranes or skin necrosis:No Has patient had a PCN reaction that required hospitalization:No Has patient had a PCN reaction occurring within the last 10 years:No If all of the above answers are "NO", then may proceed with Cephalosporin use.     Immunization History  Administered Date(s) Administered  . Influenza Split 03/26/2014  . Influenza Whole 04/09/2012  . Influenza, High Dose Seasonal PF  03/10/2013, 04/25/2017  . Influenza,inj,Quad PF,6+ Mos 05/06/2015, 04/12/2016, 03/20/2018  . Influenza,inj,quad, With Preservative 04/09/2017  . Pneumococcal Conjugate-13 05/06/2015  . Pneumococcal Polysaccharide-23 05/17/2016  . Pneumococcal-Unspecified 07/10/2016    Past Medical History:  Diagnosis Date  . CKD (chronic kidney disease), stage II   . COPD (chronic obstructive pulmonary disease) ([place])   . Coronary artery calcification seen on CT scan   . Difficulty sleeping   . Eczema     hands  . Emphysema   . GERD (gastroesophageal reflux disease)   . H/O hydronephrosis   . History of transfusion    age 72  . Hyperlipidemia   . Hypertension   . Ovarian cancer ([place]) dx'd 09/2014   metastatic - prior malignant R pleural effusion and peritoneal carcinomatosis  . S/p nephrectomy     Tobacco History: Social History   Tobacco Use  Smoking Status Former Smoker  . Packs/day: 1.00  . Years: 40.00  . Pack years: 40.00  . Types: Cigarettes  . Last attempt to quit: 07/10/2013  . Years since quitting: 4.7  Smokeless Tobacco Never Used   Counseling given: Yes  Continue not smoking.   Outpatient Encounter Medications as of 03/22/2018  Medication Sig  . acetaminophen (TYLENOL) 650 MG CR tablet Take 650 mg by mouth every 8 (eight) hours as needed for pain.  Marland Kitchen albuterol (PROVENTIL) (2.5 MG/3ML) 0.083% nebulizer solution INHALE THE CONTENTS OF 1 VIAL VIA NEBULIZER EVERY 6 HOURS AS NEEDED FOR WHEEZING OR SHORTNESS OF BREATH (Patient taking differently: Take 2.5 mg by nebulization every 6 (six) hours as needed for wheezing or shortness of breath. )  . antiseptic oral rinse (BIOTENE) LIQD 15 mLs by Mouth Rinse route 3 (three) times daily.  . ARNUITY ELLIPTA 200 MCG/ACT AEPB INHALE 1 PUFF INTO THE LUNGS DAILY (Patient taking differently: Inhale 1 puff into the lungs daily. )  . atorvastatin (LIPITOR) 10 MG tablet TAKE 1 TABLET(10 MG) BY MOUTH DAILY (Patient taking differently: Take 10 mg by mouth daily at 6 PM. )  . Biotin 2500 MCG CAPS Take 1 tablet by mouth daily.  . carvedilol (COREG) 3.125 MG tablet TAKE 1 TABLET(3.125 MG) BY MOUTH TWICE DAILY WITH A MEAL  . cholecalciferol (VITAMIN D) 1000 units tablet Take 1,000 Units by mouth daily.  . diphenoxylate-atropine (LOMOTIL) 2.5-0.025 MG tablet TAKE 2 TABLETS BY MOUTH FOUR TIMES DAILY AS NEEDED FOR DIARRHEA/ LOOSE STOOLS  . esomeprazole (NEXIUM) 40 MG capsule Take 40 mg by mouth daily at 12 noon.  . fluocinonide cream (LIDEX) 4.54  % Apply 1 application topically 2 (two) times daily as needed (For ezcema).   . furosemide (LASIX) 40 MG tablet Take 1 tablet (40 mg total) by mouth daily.  Marland Kitchen gabapentin (NEURONTIN) 300 MG capsule TAKE 1 CAPSULE BY MOUTH EVERY NIGHT AT BEDTIME  . hydrOXYzine (VISTARIL) 25 MG capsule Take 1 capsule (25 mg total) by mouth 4 (four) times daily as needed for itching.  . lidocaine-prilocaine (EMLA) cream Apply to port site one hour prior to use. Do not rub in. Cover with plastic.  Marland Kitchen loratadine (CLARITIN) 10 MG tablet Take 10 mg by mouth daily.  . ondansetron (ZOFRAN-ODT) 4 MG disintegrating tablet Take 1 tablet (4 mg total) by mouth every 8 (eight) hours as needed for nausea or vomiting.  Marland Kitchen oxyCODONE-acetaminophen (PERCOCET/ROXICET) 5-325 MG tablet Take 1 tablet by mouth every 6 (six) hours as needed for severe pain.  Marland Kitchen Potassium Chloride ER 20 MEQ TBCR TAKE 1/2 TABLET BY MOUTH DAILY  .  PROAIR HFA 108 (90 Base) MCG/ACT inhaler INHALE 2 PUFFS INTO THE LUNGS FOUR TIMES DAILY AS NEEDED FOR WHEEZING (Patient taking differently: Inhale 2 puffs into the lungs 4 (four) times daily as needed for wheezing. )  . pyridoxine (B-6) 100 MG tablet Take 100 mg by mouth daily.  Marland Kitchen STIOLTO RESPIMAT 2.5-2.5 MCG/ACT AERS INHALE 2 PUFFS INTO THE LUNGS DAILY  . traMADol (ULTRAM) 50 MG tablet Take 1 tablet (50 mg total) by mouth every 12 (twelve) hours as needed. for pain  . vitamin B-12 (CYANOCOBALAMIN) 100 MCG tablet Take 100 mcg by mouth daily. Reported on 01/19/2016  . zolpidem (AMBIEN) 5 MG tablet Take 1 tablet (5 mg total) by mouth at bedtime as needed for sleep.  Marland Kitchen azithromycin (ZITHROMAX) 250 MG tablet 500mg  (two tablets) today, then 250mg  (1 tablet) for the next 4 days  . predniSONE (DELTASONE) 10 MG tablet Take 2 tablets (20mg  total) daily for the next 5 days. Take in the AM with food.  . [EXPIRED] levalbuterol (XOPENEX) nebulizer solution 0.63 mg    No facility-administered encounter medications on file as of  03/22/2018.      Review of Systems  Review of Systems  Constitutional: Positive for fatigue. Negative for chills, fever and unexpected weight change.  HENT: Negative for congestion, ear pain, postnasal drip, sinus pressure and sinus pain.   Respiratory: Positive for cough (started 03/17/18) and shortness of breath. Negative for chest tightness and wheezing.   Cardiovascular: Negative for chest pain and palpitations.  Gastrointestinal: Negative for blood in stool, diarrhea, nausea and vomiting.  Genitourinary: Negative for dysuria, frequency and urgency.  Musculoskeletal: Negative for arthralgias.  Skin: Negative for color change.  Allergic/Immunologic: Negative for environmental allergies and food allergies.  Neurological: Negative for dizziness, light-headedness and headaches.  Psychiatric/Behavioral: Negative for dysphoric mood. The patient is not nervous/anxious.   All other systems reviewed and are negative.    Physical Exam  BP 122/68 (BP Location: Left Arm, Cuff Size: Normal)   Pulse 92   Temp 98.1 F (36.7 C) (Oral)   Ht 5\' 1"  (1.549 m)   Wt 171 lb 12.8 oz (77.9 kg)   SpO2 90%   BMI 32.46 kg/m   Wt Readings from Last 5 Encounters:  03/22/18 171 lb 12.8 oz (77.9 kg)  03/20/18 175 lb 12.8 oz (79.7 kg)  03/13/18 174 lb 4.8 oz (79.1 kg)  03/12/18 172 lb 4 oz (78.1 kg)  03/07/18 170 lb 9.6 oz (77.4 kg)   2L [place] pulse  Physical Exam  Constitutional: She is oriented to person, place, and time and well-developed, well-nourished, and in no distress. No distress.  HENT:  Head: Normocephalic and atraumatic.  Right Ear: Hearing, tympanic membrane, external ear and ear canal normal.  Left Ear: Hearing, tympanic membrane, external ear and ear canal normal.  Nose: Mucosal edema present. Right sinus exhibits no maxillary sinus tenderness and no frontal sinus tenderness. Left sinus exhibits no maxillary sinus tenderness and no frontal sinus tenderness.  Mouth/Throat: Uvula is  midline and oropharynx is clear and moist. No oropharyngeal exudate.  Eyes: Pupils are equal, round, and reactive to light.  Neck: Normal range of motion. Neck supple. No JVD present.  Cardiovascular: Normal rate, regular rhythm and normal heart sounds.  Pulmonary/Chest: Effort normal. No accessory muscle usage. No respiratory distress. She has no decreased breath sounds. She has wheezes. She has no rhonchi.  Abdominal: Soft. Bowel sounds are normal. There is no tenderness.  Musculoskeletal: Normal range of motion. She  exhibits no edema.  In wheelchair   Lymphadenopathy:    She has no cervical adenopathy.  Neurological: She is alert and oriented to person, place, and time.  Skin: Skin is warm and dry. She is not diaphoretic. No erythema.  Psychiatric: Mood, memory, affect and judgment normal.  Nursing note and vitals reviewed.   Breathing treatment office today Respiratory: Improved expiratory wheeze on exam   Lab Results:  CBC    Component Value Date/Time   WBC 3.5 (L) 03/20/2018 1037   WBC 6.4 03/03/2018 1917   RBC 2.93 (L) 03/20/2018 1037   HGB 9.1 (L) 03/20/2018 1037   HGB 11.5 (L) 07/04/2017 1122   HCT 29.4 (L) 03/20/2018 1037   HCT 35.7 07/04/2017 1122   PLT 313 03/20/2018 1037   PLT 298 07/04/2017 1122   MCV 100.3 03/20/2018 1037   MCV 92.2 07/04/2017 1122   MCH 31.1 03/20/2018 1037   MCHC 31.0 (L) 03/20/2018 1037   RDW 18.8 (H) 03/20/2018 1037   RDW 19.8 (H) 07/04/2017 1122   LYMPHSABS 0.7 (L) 03/20/2018 1037   LYMPHSABS 1.1 07/04/2017 1122   MONOABS 0.8 03/20/2018 1037   MONOABS 0.6 07/04/2017 1122   EOSABS 0.1 03/20/2018 1037   EOSABS 0.2 07/04/2017 1122   BASOSABS 0.0 03/20/2018 1037   BASOSABS 0.0 07/04/2017 1122    BMET    Component Value Date/Time   NA 142 03/20/2018 1037   NA 137 07/04/2017 1122   K 4.2 03/20/2018 1037   K 4.1 07/04/2017 1122   CL 97 (L) 03/20/2018 1037   CO2 34 (H) 03/20/2018 1037   CO2 30 (H) 07/04/2017 1122   GLUCOSE 102 (H)  03/20/2018 1037   GLUCOSE 104 07/04/2017 1122   BUN 10 03/20/2018 1037   BUN 15.8 07/04/2017 1122   CREATININE 0.89 03/20/2018 1037   CREATININE 0.9 07/04/2017 1122   CALCIUM 9.2 03/20/2018 1037   CALCIUM 9.4 07/04/2017 1122   GFRNONAA >60 03/20/2018 1037   GFRAA >60 03/20/2018 1037    BNP    Component Value Date/Time   BNP 32.5 10/18/2016 0336    ProBNP    Component Value Date/Time   PROBNP 24.0 12/17/2017 1643    Imaging: Dg Chest 2 View  Result Date: 03/21/2018 CLINICAL DATA:  Follow-up pleural effusion.  Shortness of breath. EXAM: CHEST - 2 VIEW COMPARISON:  02/22/2018 FINDINGS: Right Port-A-Cath remains in place, unchanged. There is hyperinflation of the lungs compatible with COPD. Lingular scarring. No confluent airspace opacities. No visible significant effusions. No acute bony abnormality. IMPRESSION: COPD.  No active disease. Electronically Signed   By: Rolm Baptise M.D.   On: 03/21/2018 08:05   Dg Chest 2 View  Result Date: 02/22/2018 CLINICAL DATA:  Patient history of ovarian carcinoma. Shortness of breath. EXAM: CHEST - 2 VIEW COMPARISON:  Chest radiograph 12/31/2017 FINDINGS: Right anterior chest wall Port-A-Cath is present with tip projecting over the superior vena cava. Monitoring leads overlie the patient. Stable cardiac and mediastinal contours. Aortic atherosclerosis. Small left pleural effusion with underlying opacities. No pneumothorax. Thoracic spine degenerative changes. Chronic deformity right humerus. IMPRESSION: Persistent small left pleural effusion with underlying opacities. Electronically Signed   By: Lovey Newcomer M.D.   On: 02/22/2018 19:14   Ct Angio Chest Pe W Or Wo Contrast  Result Date: 02/23/2018 CLINICAL DATA:  History of ovarian cancer and shortness of breath with elevated D-dimer EXAM: CT ANGIOGRAPHY CHEST WITH CONTRAST TECHNIQUE: Multidetector CT imaging of the chest was performed using the  standard protocol during bolus administration of  intravenous contrast. Multiplanar CT image reconstructions and MIPs were obtained to evaluate the vascular anatomy. CONTRAST:  155mL ISOVUE-370 IOPAMIDOL (ISOVUE-370) INJECTION 76% COMPARISON:  01/04/2018 FINDINGS: Cardiovascular: Atherosclerotic changes of the thoracic aorta are noted without aneurysmal dilatation or dissection. Mild coronary calcifications are noted. No cardiac enlargement is seen. The pulmonary artery shows a normal branching pattern without intraluminal filling defect to suggest pulmonary embolism. Mediastinum/Nodes: Thoracic inlet is within normal limits. No hilar or mediastinal adenopathy is noted. No esophageal abnormality is seen. Lungs/Pleura: Diffuse emphysematous changes are identified. Mild by basilar atelectasis is noted stable from the prior exam. Small bilateral pleural effusions are noted but improved when compared with the prior study. No new focal pulmonary nodule is seen. Upper Abdomen: Visualized upper abdomen demonstrates changes of mild ascites consistent with patient's given clinical history. No other focal abnormality is noted. Musculoskeletal: Degenerative changes of the thoracic spine are noted. Review of the MIP images confirms the above findings. IMPRESSION: No evidence of pulmonary emboli. Bibasilar atelectasis/scarring with small effusions. Aortic Atherosclerosis (ICD10-I70.0) and Emphysema (ICD10-J43.9). Electronically Signed   By: Inez Catalina M.D.   On: 02/23/2018 08:54   Ct Abdomen Pelvis W Contrast  Result Date: 03/03/2018 CLINICAL DATA:  Generalized abdominal pain with vomiting since yesterday. History of ovarian cancer. EXAM: CT ABDOMEN AND PELVIS WITH CONTRAST TECHNIQUE: Multidetector CT imaging of the abdomen and pelvis was performed using the standard protocol following bolus administration of intravenous contrast. CONTRAST:  152mL ISOVUE-300 IOPAMIDOL (ISOVUE-300) INJECTION 61% COMPARISON:  CT abdomen dated 01/04/2018. CT abdomen dated 09/10/2017.  FINDINGS: Lower chest: Small bilateral pleural effusions. LEFT pleural effusion is decreased compared to the most recent abdomen CT of 01/04/2018, similar to interval chest CT of 02/23/2018. Hepatobiliary: Perihepatic ascites. Scalloped appearance of the liver margins suggest malignant ascites. No parenchymal mass or lesion seen. Status post cholecystectomy. Pancreas: Unremarkable. No pancreatic ductal dilatation or surrounding inflammatory changes. Spleen: Normal in size without focal abnormality. Adrenals/Urinary Tract: LEFT kidney is absent. RIGHT kidney appears normal without mass, stone or hydronephrosis. Bladder is decompressed. Stomach/Bowel: Prominent thickening of the walls of the small bowel in the RIGHT lower quadrant and upper pelvis. No dilated large or small bowel loops. Diverticulosis of the sigmoid and descending colon but no focal inflammatory change to suggest acute diverticulitis. Vascular/Lymphatic: Aortic atherosclerosis. No enlarged abdominal or pelvic lymph nodes. Again noted are scattered small mesenteric and retroperitoneal lymph nodes. Reproductive: Status post hysterectomy.  No adnexal mass seen. Other: Moderate amount of free fluid within the abdomen or pelvis, suspected malignant ascites. Musculoskeletal: No acute or suspicious osseous findings. IMPRESSION: 1. Prominent thickening of the walls of the small bowel loops in the RIGHT lower quadrant and upper pelvis, indicating small bowel enteritis of infectious or inflammatory nature. No bowel obstruction. 2. Moderate amount of free fluid within the abdomen and pelvis. Suspect malignant ascites, given the associated scalloping of the liver margins. No associated omental caking identified. 3. Small bilateral pleural effusions, similar to chest CT of 02/23/2018. 4. LEFT kidney is surgically absent. 5. Colonic diverticulosis without evidence of acute diverticulitis. 6.  Aortic Atherosclerosis (ICD10-I70.0). Electronically Signed   By: Franki Cabot M.D.   On: 03/03/2018 20:22   US Paracentesis  Result Date: 03/20/2018 INDICATION: Patient with history of fallopian tube carcinoma, ascites. Request made for diagnostic and therapeutic paracentesis. EXAM: ULTRASOUND GUIDED DIAGNOSTIC AND THERAPEUTIC PARACENTESIS MEDICATIONS: None COMPLICATIONS: None immediate. PROCEDURE: Informed written consent was obtained from the patient after a  discussion of the risks, benefits and alternatives to treatment. A timeout was performed prior to the initiation of the procedure. Initial ultrasound scanning demonstrates a small amount of ascites within the left lower abdominal quadrant. The left lower abdomen was prepped and draped in the usual sterile fashion. 1% lidocaine was used for local anesthesia. Following this, a 19 gauge, 10-cm, Yueh catheter was introduced. An ultrasound image was saved for documentation purposes. The paracentesis was performed. The catheter was removed and a dressing was applied. The patient tolerated the procedure well without immediate post procedural complication. FINDINGS: A total of approximately 950 cc of hazy, yellow fluid was removed. Samples were sent to the laboratory as requested by the clinical team. IMPRESSION: Successful ultrasound-guided diagnostic and therapeutic paracentesis yielding 950 cc of peritoneal fluid. Read by: Rowe Robert, PA-C Electronically Signed   By: Jacqulynn Cadet M.D.   On: 03/20/2018 15:54      Assessment & Plan:   77 year old female patient seen today for acute visit.  Patient with believe COPD exacerbation today.  Will treat patient with Z-Pak as well as prednisone.  Do not believe patient needs chest x-ray she recently had had one this week that was normal.  Provided breathing treatment for patient today which helped.  If patient gets to a stable interval be worth trialing patient off of Arnuity with Pulmicort nebs to see if that helps with her management.  Patient to contact us if her shortness  of breath is not improving under this current regimen.  COPD GOLD III B Xopenex neb today   Azithromycin 250mg  tablet  >>>Take 2 tablets (500mg  total) today, and then 1 tablet (250mg ) for the next four days  >>>take with food  >>>can also take probiotic and / or yogurt while on antibiotic   Prednisone 10mg  tablet  >>>Take 2 tablets (20 mg total) daily for the next 5 days >>> Take with food in the morning  Stiolto Respimat inhaler >>>2 puffs daily >>>Take this no matter what >>>This is not a rescue inhaler  Continue Arnuity  Do not believe patient needs chest x-ray today  Continue oxygen therapy as prescribed >>>Continue to ensure oxygen levels remain greater than 90% if not please contact our office  Keep follow-up with Dr. Lamonte Sakai December 2019/January 2020    Chronic respiratory failure with hypoxia ([place])  Continue oxygen therapy as prescribed >>>Continue to ensure oxygen levels remain greater than 90% if not please contact our office      Lauraine Rinne, NP 03/22/2018

## 2018-03-22 NOTE — Patient Instructions (Addendum)
Xopenex neb today   Azithromycin 250mg  tablet  >>>Take 2 tablets (500mg  total) today, and then 1 tablet (250mg ) for the next four days  >>>take with food  >>>can also take probiotic and / or yogurt while on antibiotic   Prednisone 10mg  tablet  >>>Take 2 tablets (20 mg total) daily for the next 5 days >>> Take with food in the morning  Stiolto Respimat inhaler >>>2 puffs daily >>>Take this no matter what >>>This is not a rescue inhaler  Continue Arnuity  Do not believe patient needs chest x-ray today  Continue oxygen therapy as prescribed >>>Continue to ensure oxygen levels remain greater than 90% if not please contact our office  Keep follow-up with Dr. Lamonte Sakai December 2019/January 2020     Note your daily symptoms > remember "red flags" for COPD:   >>>Increase in cough >>>increase in sputum production >>>increase in shortness of breath or activity  intolerance.   If you notice these symptoms, please call the office to be seen.       It is flu season:   >>>Remember to be washing your hands regularly, using hand sanitizer, be careful to use around herself with has contact with people who are sick will increase her chances of getting sick yourself. >>> Best ways to protect herself from the flu: Receive the yearly flu vaccine, practice good hand hygiene washing with soap and also using hand sanitizer when available, eat a nutritious meals, get adequate rest, hydrate appropriately   Please contact the office if your symptoms worsen or you have concerns that you are not improving.   Thank you for choosing [facility] for your healthcare, and for allowing [place] to partner with you on your healthcare journey. I am thankful to be able to provide care to you today.   Lynn Quaker FNP-C   Chronic Obstructive Pulmonary Disease Chronic obstructive pulmonary disease (COPD) is a long-term (chronic) lung problem. When you have COPD, it is hard for air to get in and out of  your lungs. The way your lungs work will never return to normal. Usually the condition gets worse over time. There are things you can do to keep yourself as healthy as possible. Your doctor may treat your condition with:  Medicines.  Quitting smoking, if you smoke.  Rehabilitation. This may involve a team of specialists.  Oxygen.  Exercise and changes to your diet.  Lung surgery.  Comfort measures (palliative care).  Follow these instructions at home: Medicines  Take over-the-counter and prescription medicines only as told by your doctor.  Talk to your doctor before taking any cough or allergy medicines. You may need to avoid medicines that cause your lungs to be dry. Lifestyle  If you smoke, stop. Smoking makes the problem worse. If you need help quitting, ask your doctor.  Avoid being around things that make your breathing worse. This may include smoke, chemicals, and fumes.  Stay active, but remember to also rest.  Learn and use tips on how to relax.  Make sure you get enough sleep. Most adults need at least 7 hours a night.  Eat healthy foods. Eat smaller meals more often. Rest before meals. Controlled breathing  Learn and use tips on how to control your breathing as told by your doctor. Try: ? Breathing in (inhaling) through your nose for 1 second. Then, pucker your lips and breath out (exhale) through your lips for 2 seconds. ? Putting one hand on your belly (abdomen). Breathe in slowly through your  nose for 1 second. Your hand on your belly should move out. Pucker your lips and breathe out slowly through your lips. Your hand on your belly should move in as you breathe out. Controlled coughing  Learn and use controlled coughing to clear mucus from your lungs. The steps are: 1. Lean your head a little forward. 2. Breathe in deeply. 3. Try to hold your breath for 3 seconds. 4. Keep your mouth slightly open while coughing 2 times. 5. Spit any mucus out into a  tissue. 6. Rest and do the steps again 1 or 2 times as needed. General instructions  Make sure you get all the shots (vaccines) that your doctor recommends. Ask your doctor about a flu shot and a pneumonia shot.  Use oxygen therapy and therapy to help improve your lungs (pulmonary rehabilitation) if told by your doctor. If you need home oxygen therapy, ask your doctor if you should buy a tool to measure your oxygen level (oximeter).  Make a COPD action plan with your doctor. This helps you know what to do if you feel worse than usual.  Manage any other conditions you have as told by your doctor.  Avoid going outside when it is very hot, cold, or humid.  Avoid people who have a sickness you can catch (contagious).  Keep all follow-up visits as told by your doctor. This is important. Contact a doctor if:  You cough up more mucus than usual.  There is a change in the color or thickness of the mucus.  It is harder to breathe than usual.  Your breathing is faster than usual.  You have trouble sleeping.  You need to use your medicines more often than usual.  You have trouble doing your normal activities such as getting dressed or walking around the house. Get help right away if:  You have shortness of breath while resting.  You have shortness of breath that stops you from: ? Being able to talk. ? Doing normal activities.  Your chest hurts for longer than 5 minutes.  Your skin color is more blue than usual.  Your pulse oximeter shows that you have low oxygen for longer than 5 minutes.  You have a fever.  You feel too tired to breathe normally. Summary  Chronic obstructive pulmonary disease (COPD) is a long-term lung problem.  The way your lungs work will never return to normal. Usually the condition gets worse over time. There are things you can do to keep yourself as healthy as possible.  Take over-the-counter and prescription medicines only as told by your  doctor.  If you smoke, stop. Smoking makes the problem worse. This information is not intended to replace advice given to you by your health care provider. Make sure you discuss any questions you have with your health care provider. Document Released: 12/13/2007 Document Revised: 12/02/2015 Document Reviewed: 02/20/2013 Elsevier Interactive Patient Education  2017 [employer].

## 2018-03-22 NOTE — Assessment & Plan Note (Signed)
  Continue oxygen therapy as prescribed >>>Continue to ensure oxygen levels remain greater than 90% if not please contact our office

## 2018-03-23 ENCOUNTER — Emergency Department (HOSPITAL_COMMUNITY): Payer: Medicare Other

## 2018-03-23 ENCOUNTER — Telehealth: Payer: Self-pay | Admitting: Internal Medicine

## 2018-03-23 ENCOUNTER — Encounter (HOSPITAL_COMMUNITY): Payer: Self-pay

## 2018-03-23 ENCOUNTER — Inpatient Hospital Stay (HOSPITAL_COMMUNITY)
Admission: EM | Admit: 2018-03-23 | Discharge: 2018-03-27 | DRG: 193 | Disposition: A | Discharge status: Home / Self Care | Payer: Medicare Other | Attending: Internal Medicine | Admitting: Internal Medicine

## 2018-03-23 ENCOUNTER — Other Ambulatory Visit: Payer: Self-pay

## 2018-03-23 DIAGNOSIS — E872 Acidosis: Secondary | ICD-10-CM | POA: Diagnosis present

## 2018-03-23 DIAGNOSIS — Z823 Family history of stroke: Secondary | ICD-10-CM

## 2018-03-23 DIAGNOSIS — C799 Secondary malignant neoplasm of unspecified site: Secondary | ICD-10-CM | POA: Diagnosis present

## 2018-03-23 DIAGNOSIS — J910 Malignant pleural effusion: Secondary | ICD-10-CM | POA: Diagnosis not present

## 2018-03-23 DIAGNOSIS — D630 Anemia in neoplastic disease: Secondary | ICD-10-CM | POA: Diagnosis present

## 2018-03-23 DIAGNOSIS — C569 Malignant neoplasm of unspecified ovary: Secondary | ICD-10-CM | POA: Diagnosis not present

## 2018-03-23 DIAGNOSIS — Z90710 Acquired absence of both cervix and uterus: Secondary | ICD-10-CM

## 2018-03-23 DIAGNOSIS — Z803 Family history of malignant neoplasm of breast: Secondary | ICD-10-CM

## 2018-03-23 DIAGNOSIS — R0602 Shortness of breath: Secondary | ICD-10-CM

## 2018-03-23 DIAGNOSIS — N182 Chronic kidney disease, stage 2 (mild): Secondary | ICD-10-CM | POA: Diagnosis present

## 2018-03-23 DIAGNOSIS — R918 Other nonspecific abnormal finding of lung field: Secondary | ICD-10-CM | POA: Diagnosis not present

## 2018-03-23 DIAGNOSIS — Z8249 Family history of ischemic heart disease and other diseases of the circulatory system: Secondary | ICD-10-CM

## 2018-03-23 DIAGNOSIS — J9621 Acute and chronic respiratory failure with hypoxia: Secondary | ICD-10-CM | POA: Diagnosis present

## 2018-03-23 DIAGNOSIS — K219 Gastro-esophageal reflux disease without esophagitis: Secondary | ICD-10-CM | POA: Diagnosis present

## 2018-03-23 DIAGNOSIS — Z905 Acquired absence of kidney: Secondary | ICD-10-CM

## 2018-03-23 DIAGNOSIS — J9611 Chronic respiratory failure with hypoxia: Secondary | ICD-10-CM | POA: Diagnosis present

## 2018-03-23 DIAGNOSIS — I10 Essential (primary) hypertension: Secondary | ICD-10-CM | POA: Diagnosis present

## 2018-03-23 DIAGNOSIS — C5700 Malignant neoplasm of unspecified fallopian tube: Secondary | ICD-10-CM | POA: Diagnosis not present

## 2018-03-23 DIAGNOSIS — I340 Nonrheumatic mitral (valve) insufficiency: Secondary | ICD-10-CM | POA: Diagnosis not present

## 2018-03-23 DIAGNOSIS — C561 Malignant neoplasm of right ovary: Secondary | ICD-10-CM | POA: Diagnosis present

## 2018-03-23 DIAGNOSIS — D631 Anemia in chronic kidney disease: Secondary | ICD-10-CM | POA: Diagnosis present

## 2018-03-23 DIAGNOSIS — J159 Unspecified bacterial pneumonia: Principal | ICD-10-CM | POA: Diagnosis present

## 2018-03-23 DIAGNOSIS — Z806 Family history of leukemia: Secondary | ICD-10-CM | POA: Diagnosis not present

## 2018-03-23 DIAGNOSIS — Z87891 Personal history of nicotine dependence: Secondary | ICD-10-CM

## 2018-03-23 DIAGNOSIS — C562 Malignant neoplasm of left ovary: Secondary | ICD-10-CM | POA: Diagnosis present

## 2018-03-23 DIAGNOSIS — Z9981 Dependence on supplemental oxygen: Secondary | ICD-10-CM | POA: Diagnosis not present

## 2018-03-23 DIAGNOSIS — I2510 Atherosclerotic heart disease of native coronary artery without angina pectoris: Secondary | ICD-10-CM | POA: Diagnosis present

## 2018-03-23 DIAGNOSIS — C482 Malignant neoplasm of peritoneum, unspecified: Secondary | ICD-10-CM | POA: Diagnosis present

## 2018-03-23 DIAGNOSIS — Z9049 Acquired absence of other specified parts of digestive tract: Secondary | ICD-10-CM

## 2018-03-23 DIAGNOSIS — Z9221 Personal history of antineoplastic chemotherapy: Secondary | ICD-10-CM | POA: Diagnosis not present

## 2018-03-23 DIAGNOSIS — J439 Emphysema, unspecified: Secondary | ICD-10-CM | POA: Diagnosis present

## 2018-03-23 DIAGNOSIS — J449 Chronic obstructive pulmonary disease, unspecified: Secondary | ICD-10-CM | POA: Diagnosis not present

## 2018-03-23 DIAGNOSIS — E785 Hyperlipidemia, unspecified: Secondary | ICD-10-CM | POA: Diagnosis present

## 2018-03-23 DIAGNOSIS — D638 Anemia in other chronic diseases classified elsewhere: Secondary | ICD-10-CM | POA: Diagnosis not present

## 2018-03-23 DIAGNOSIS — D6481 Anemia due to antineoplastic chemotherapy: Secondary | ICD-10-CM | POA: Diagnosis present

## 2018-03-23 DIAGNOSIS — R188 Other ascites: Secondary | ICD-10-CM | POA: Diagnosis not present

## 2018-03-23 DIAGNOSIS — Z800 Family history of malignant neoplasm of digestive organs: Secondary | ICD-10-CM

## 2018-03-23 DIAGNOSIS — J441 Chronic obstructive pulmonary disease with (acute) exacerbation: Secondary | ICD-10-CM | POA: Diagnosis not present

## 2018-03-23 DIAGNOSIS — J189 Pneumonia, unspecified organism: Secondary | ICD-10-CM | POA: Diagnosis present

## 2018-03-23 DIAGNOSIS — C786 Secondary malignant neoplasm of retroperitoneum and peritoneum: Secondary | ICD-10-CM | POA: Diagnosis present

## 2018-03-23 DIAGNOSIS — Z801 Family history of malignant neoplasm of trachea, bronchus and lung: Secondary | ICD-10-CM

## 2018-03-23 DIAGNOSIS — Z833 Family history of diabetes mellitus: Secondary | ICD-10-CM

## 2018-03-23 LAB — CBC WITH DIFFERENTIAL/PLATELET
BASOS ABS: 0 10*3/uL (ref 0.0–0.1)
BASOS PCT: 0 %
EOS ABS: 0 10*3/uL (ref 0.0–0.7)
Eosinophils Relative: 0 %
HCT: 30.3 % — ABNORMAL LOW (ref 36.0–46.0)
Hemoglobin: 9.2 g/dL — ABNORMAL LOW (ref 12.0–15.0)
LYMPHS ABS: 0.5 10*3/uL — AB (ref 0.7–4.0)
Lymphocytes Relative: 9 %
MCH: 30.5 pg (ref 26.0–34.0)
MCHC: 30.4 g/dL (ref 30.0–36.0)
MCV: 100.3 fL — ABNORMAL HIGH (ref 78.0–100.0)
Monocytes Absolute: 0.4 10*3/uL (ref 0.1–1.0)
Monocytes Relative: 8 %
Neutro Abs: 4.7 10*3/uL (ref 1.7–7.7)
Neutrophils Relative %: 83 %
PLATELETS: 305 10*3/uL (ref 150–400)
RBC: 3.02 MIL/uL — AB (ref 3.87–5.11)
RDW: 18.8 % — ABNORMAL HIGH (ref 11.5–15.5)
WBC: 5.6 10*3/uL (ref 4.0–10.5)

## 2018-03-23 LAB — BASIC METABOLIC PANEL
ANION GAP: 10 (ref 5–15)
BUN: 15 mg/dL (ref 8–23)
CO2: 35 mmol/L — ABNORMAL HIGH (ref 22–32)
Calcium: 9.1 mg/dL (ref 8.9–10.3)
Chloride: 96 mmol/L — ABNORMAL LOW (ref 98–111)
Creatinine, Ser: 1.01 mg/dL — ABNORMAL HIGH (ref 0.44–1.00)
GFR, EST NON AFRICAN AMERICAN: 53 mL/min — AB (ref >60)
Glucose, Bld: 137 mg/dL — ABNORMAL HIGH (ref 70–99)
POTASSIUM: 4.5 mmol/L (ref 3.5–5.1)
Sodium: 141 mmol/L (ref 135–145)

## 2018-03-23 LAB — I-STAT TROPONIN, ED: TROPONIN I, POC: 0 ng/mL (ref 0.00–0.08)

## 2018-03-23 LAB — BRAIN NATRIURETIC PEPTIDE: B NATRIURETIC PEPTIDE 5: 65.5 pg/mL (ref 0.0–100.0)

## 2018-03-23 MED ORDER — ZOLPIDEM TARTRATE 5 MG PO TABS
5.0000 mg | ORAL_TABLET | Freq: Every evening | ORAL | Status: DC | PRN
  Administered 2018-03-24 – 2018-03-26 (×4): 5 mg via ORAL
  Filled 2018-03-23 (×4): qty 1

## 2018-03-23 MED ORDER — GABAPENTIN 300 MG PO CAPS
300.0000 mg | ORAL_CAPSULE | Freq: Every day | ORAL | Status: DC
  Administered 2018-03-23 – 2018-03-26 (×4): 300 mg via ORAL
  Filled 2018-03-23 (×5): qty 1

## 2018-03-23 MED ORDER — LORATADINE 10 MG PO TABS
10.0000 mg | ORAL_TABLET | Freq: Every day | ORAL | Status: DC
  Administered 2018-03-23 – 2018-03-27 (×5): 10 mg via ORAL
  Filled 2018-03-23 (×5): qty 1

## 2018-03-23 MED ORDER — IPRATROPIUM-ALBUTEROL 0.5-2.5 (3) MG/3ML IN SOLN
3.0000 mL | Freq: Four times a day (QID) | RESPIRATORY_TRACT | Status: DC
  Administered 2018-03-23 – 2018-03-25 (×6): 3 mL via RESPIRATORY_TRACT
  Filled 2018-03-23 (×5): qty 3

## 2018-03-23 MED ORDER — DOCUSATE SODIUM 100 MG PO CAPS
100.0000 mg | ORAL_CAPSULE | Freq: Two times a day (BID) | ORAL | Status: DC
  Administered 2018-03-24 – 2018-03-27 (×7): 100 mg via ORAL
  Filled 2018-03-23 (×8): qty 1

## 2018-03-23 MED ORDER — GUAIFENESIN 100 MG/5ML PO SOLN
5.0000 mL | ORAL | Status: DC | PRN
  Administered 2018-03-23 – 2018-03-26 (×7): 100 mg via ORAL
  Filled 2018-03-23 (×7): qty 10

## 2018-03-23 MED ORDER — METHYLPREDNISOLONE SODIUM SUCC 125 MG IJ SOLR
60.0000 mg | Freq: Two times a day (BID) | INTRAMUSCULAR | Status: DC
  Administered 2018-03-23 – 2018-03-24 (×2): 60 mg via INTRAVENOUS
  Filled 2018-03-23 (×2): qty 2

## 2018-03-23 MED ORDER — ACETAMINOPHEN 650 MG RE SUPP: 650.0000 mg | Freq: Four times a day (QID) | RECTAL | Status: DC | PRN

## 2018-03-23 MED ORDER — ATORVASTATIN CALCIUM 10 MG PO TABS
10.0000 mg | ORAL_TABLET | Freq: Every day | ORAL | Status: DC
  Administered 2018-03-24 – 2018-03-26 (×3): 10 mg via ORAL
  Filled 2018-03-23 (×3): qty 1

## 2018-03-23 MED ORDER — ENOXAPARIN SODIUM 40 MG/0.4ML ~~LOC~~ SOLN
40.0000 mg | Freq: Every day | SUBCUTANEOUS | Status: DC
  Administered 2018-03-23 – 2018-03-25 (×3): 40 mg via SUBCUTANEOUS
  Filled 2018-03-23 (×3): qty 0.4

## 2018-03-23 MED ORDER — OXYCODONE-ACETAMINOPHEN 5-325 MG PO TABS: 1.0000 | ORAL_TABLET | Freq: Four times a day (QID) | ORAL | Status: DC | PRN

## 2018-03-23 MED ORDER — ALBUTEROL (5 MG/ML) CONTINUOUS INHALATION SOLN
10.0000 mg/h | INHALATION_SOLUTION | Freq: Once | RESPIRATORY_TRACT | Status: AC
  Administered 2018-03-23: 10 mg/h via RESPIRATORY_TRACT
  Filled 2018-03-23: qty 20

## 2018-03-23 MED ORDER — PROMETHAZINE HCL 25 MG PO TABS: 12.5000 mg | ORAL_TABLET | Freq: Four times a day (QID) | ORAL | Status: DC | PRN

## 2018-03-23 MED ORDER — ONDANSETRON 4 MG PO TBDP: 4.0000 mg | ORAL_TABLET | Freq: Three times a day (TID) | ORAL | Status: DC | PRN

## 2018-03-23 MED ORDER — BISACODYL 5 MG PO TBEC: 5.0000 mg | DELAYED_RELEASE_TABLET | Freq: Every day | ORAL | Status: DC | PRN

## 2018-03-23 MED ORDER — ALBUTEROL SULFATE (2.5 MG/3ML) 0.083% IN NEBU
2.5000 mg | INHALATION_SOLUTION | RESPIRATORY_TRACT | Status: DC | PRN
  Administered 2018-03-24 – 2018-03-27 (×3): 2.5 mg via RESPIRATORY_TRACT
  Filled 2018-03-23 (×4): qty 3

## 2018-03-23 MED ORDER — ACETAMINOPHEN 325 MG PO TABS: 650.0000 mg | ORAL_TABLET | Freq: Four times a day (QID) | ORAL | Status: DC | PRN

## 2018-03-23 MED ORDER — VITAMIN B-12 100 MCG PO TABS
100.0000 ug | ORAL_TABLET | Freq: Every day | ORAL | Status: DC
  Administered 2018-03-23 – 2018-03-27 (×5): 100 ug via ORAL
  Filled 2018-03-23 (×5): qty 1

## 2018-03-23 MED ORDER — SODIUM CHLORIDE 0.45 % IV SOLN
INTRAVENOUS | Status: DC
  Administered 2018-03-23 (×2): via INTRAVENOUS

## 2018-03-23 MED ORDER — PANTOPRAZOLE SODIUM 40 MG PO TBEC
40.0000 mg | DELAYED_RELEASE_TABLET | Freq: Every day | ORAL | Status: DC
  Administered 2018-03-24 – 2018-03-27 (×4): 40 mg via ORAL
  Filled 2018-03-23 (×4): qty 1

## 2018-03-23 MED ORDER — LEVOFLOXACIN IN D5W 750 MG/150ML IV SOLN
750.0000 mg | INTRAVENOUS | Status: DC
  Administered 2018-03-23 – 2018-03-25 (×2): 750 mg via INTRAVENOUS
  Filled 2018-03-23 (×2): qty 150

## 2018-03-23 MED ORDER — ACETAMINOPHEN ER 650 MG PO TBCR: 650.0000 mg | EXTENDED_RELEASE_TABLET | Freq: Three times a day (TID) | ORAL | Status: DC | PRN

## 2018-03-23 MED ORDER — VITAMIN B-6 100 MG PO TABS
100.0000 mg | ORAL_TABLET | Freq: Every day | ORAL | Status: DC
  Filled 2018-03-23 (×5): qty 1

## 2018-03-23 MED ORDER — VITAMIN D3 25 MCG (1000 UNIT) PO TABS
1000.0000 [IU] | ORAL_TABLET | Freq: Every day | ORAL | Status: DC
  Administered 2018-03-23 – 2018-03-27 (×5): 1000 [IU] via ORAL
  Filled 2018-03-23 (×5): qty 1

## 2018-03-23 MED ORDER — IOPAMIDOL (ISOVUE-370) INJECTION 76%
INTRAVENOUS | Status: AC
  Administered 2018-03-23: 100 mL
  Filled 2018-03-23: qty 100

## 2018-03-23 MED ORDER — SODIUM CHLORIDE 0.9 % IV BOLUS
500.0000 mL | Freq: Once | INTRAVENOUS | Status: AC
  Administered 2018-03-23: 500 mL via INTRAVENOUS

## 2018-03-23 MED ORDER — ALBUTEROL SULFATE (2.5 MG/3ML) 0.083% IN NEBU
5.0000 mg | INHALATION_SOLUTION | Freq: Once | RESPIRATORY_TRACT | Status: AC
  Administered 2018-03-23: 5 mg via RESPIRATORY_TRACT
  Filled 2018-03-23: qty 6

## 2018-03-23 MED ORDER — DIPHENOXYLATE-ATROPINE 2.5-0.025 MG PO TABS: 2.0000 | ORAL_TABLET | Freq: Four times a day (QID) | ORAL | Status: DC | PRN

## 2018-03-23 MED ORDER — IPRATROPIUM BROMIDE 0.02 % IN SOLN
0.5000 mg | Freq: Once | RESPIRATORY_TRACT | Status: AC
  Administered 2018-03-23: 0.5 mg via RESPIRATORY_TRACT
  Filled 2018-03-23: qty 2.5

## 2018-03-23 MED ORDER — METHYLPREDNISOLONE SODIUM SUCC 125 MG IJ SOLR
125.0000 mg | Freq: Once | INTRAMUSCULAR | Status: AC
  Administered 2018-03-23: 125 mg via INTRAVENOUS
  Filled 2018-03-23: qty 2

## 2018-03-23 NOTE — ED Provider Notes (Signed)
Medical screening examination/treatment/procedure(s) were conducted as a shared visit with non-physician practitioner(s) and myself.  I personally evaluated the patient during the encounter.  EKG Interpretation  Date/Time:  Saturday March 23 2018 13:08:35 EDT Ventricular Rate:  96 PR Interval:    QRS Duration: 94 QT Interval:  414 QTC Calculation: 524 R Axis:   99 Text Interpretation:  Sinus rhythm Sinus pause Right axis deviation Prolonged QT interval Baseline wander in lead(s) V6 Confirmed by Lacretia Leigh ([id number]) on 03/23/2018 3:32:10 PM   Patient here complaining of shortness of breath with increased cough and wheezing.  Has a history of COPD.  Has wheezing on exam here.  Given albuterol with some improvement.  No fever or chills.  Concern for possible PE.  Chest CT pending      Lacretia Leigh, MD 03/23/18 1534

## 2018-03-23 NOTE — ED Provider Notes (Signed)
[facility] DEPT Provider Note   CSN: [id number] Arrival date & time: 03/23/18  1234     History   Chief Complaint Chief Complaint  Patient presents with  . Shortness of Breath  . Cancer    HPI Lynn Ramirez is a 77 y.o. female presenting for shortness of breath that began late on the night of 03/21/2018.  Patient states that she has been having increased work of breathing since that time, she presented to her primary care doctor yesterday and did not receive any imaging or blood work done, wheezing heard and patient prescribed prednisone 20 mg daily, azithromycin and given breathing treatment.  Patient has been continually using her breathing treatments yesterday and today without relief.  Patient with history of smoking and COPD, states that she does require albuterol on a regular basis, she states that she has developed a productive cough with white sputum over the past week. Patient states that she uses 2L o2 via [place] regularly at home.  Of note patient is currently receiving treatment for ovarian cancer.  On my initial evaluation patient is speaking full sentences however endorsing shortness of breath, SPO2 low 90s on 2 L via nasal cannula heart rate in the 90s.  Patient denies chest pain, history of blood clots, extremity swelling.  HPI  Past Medical History:  Diagnosis Date  . CKD (chronic kidney disease), stage II   . COPD (chronic obstructive pulmonary disease) ([place])   . Coronary artery calcification seen on CT scan   . Difficulty sleeping   . Eczema    hands  . Emphysema   . GERD (gastroesophageal reflux disease)   . H/O hydronephrosis   . History of transfusion    age 26  . Hyperlipidemia   . Hypertension   . Ovarian cancer ([place]) dx'd 09/2014   metastatic - prior malignant R pleural effusion and peritoneal carcinomatosis  . S/p nephrectomy     Patient Active Problem List   Diagnosis Date Noted  . Normocytic anemia 02/22/2018    . Hypokalemia 02/22/2018  . On home oxygen therapy 02/22/2018  . Left leg pain 07/16/2017  . S/p nephrectomy   . History of transfusion   . H/O hydronephrosis   . GERD (gastroesophageal reflux disease)   . Eczema   . Difficulty sleeping   . Coronary artery calcification seen on CT scan   . CKD (chronic kidney disease), stage II   . Chronic diastolic CHF (congestive heart failure) ([place]) 04/26/2017  . Cardiomyopathy ([place]) 04/26/2017  . Mixed incontinence urge and stress 01/31/2017  . Chronic cough 01/15/2017  . Patient on antineoplastic chemotherapy regimen 10/18/2016  . Pain and swelling of right lower extremity 10/02/2016  . Goals of care, counseling/discussion 09/26/2016  . HTN (hypertension) 08/07/2016  . B12 deficiency 08/07/2016  . Vitamin D deficiency 08/07/2016  . Diarrhea 07/06/2016  . Cramping of hands 07/06/2016  . Port catheter in place 11/02/2015  . Allergic rhinitis 11/02/2015  . Constipation   . Sigmoid stricture ([place])   . Obstipation 10/08/2015  . COPD GOLD III B   . Pulmonary nodules/lesions, multiple 08/08/2015  . Chronic respiratory failure with hypoxia ([place]) 06/20/2015  . CAD (coronary artery disease) 03/31/2015  . Hyperlipidemia 03/31/2015  . Claudication ([place]) 03/31/2015  . Chemotherapy-induced neuropathy ([place]) 03/25/2015  . Genetic testing 02/19/2015  . Malignant neoplasm of fallopian tube ([place]) 02/19/2015  . Family history of pancreatic cancer 02/19/2015  . Family history of breast cancer in female 02/19/2015  .  Ovarian cancer ([place]) 12/29/2014  . Malignant ascites 10/30/2014  . Primary peritoneal carcinomatosis ([place]) 10/23/2014  . Metastatic adenocarcinoma ([place]) 10/21/2014  . Insomnia 10/11/2014  . Pelvic mass in female 10/05/2014  . Malignant pleural effusion 09/29/2014  . Encopresis 06/18/2014    Past Surgical History:  Procedure Laterality Date  . ABDOMINAL HYSTERECTOMY    . CHOLECYSTECTOMY N/A 08/09/2015   Procedure: Attempted  LAPAROSCOPIC coverted  open CHOLECYSTECTOMY WITH INTRAOPERATIVE CHOLANGIOGRAM;  Surgeon: Autumn Messing III, MD;  Location: [facility];  Service: General;  Laterality: N/A;  . HAND SURGERY Right 1980's  . LAPAROTOMY N/A 12/29/2014   Procedure:  LAPAROTOMY;  Surgeon: Everitt Amber, MD;  Location: [facility];  Service: Gynecology;  Laterality: N/A;  . NEPHRECTOMY    . ROBOTIC ASSISTED TOTAL HYSTERECTOMY WITH BILATERAL SALPINGO OOPHERECTOMY Bilateral 12/29/2014   Procedure: ROBOTIC ASSISTED TOTAL LAPAROSCOPIC HYSTERECTOMY WITH BILATERAL SALPINGO OOPHORECTOMY AND OOMENTECTOMY WITH RADICAL TUMOR [place] ;  Surgeon: Everitt Amber, MD;  Location: [facility];  Service: Gynecology;  Laterality: Bilateral;  . THORACENTESIS     several  . TONSILLECTOMY    . TUBAL LIGATION       OB History   None      Home Medications    Prior to Admission medications   Medication Sig Start Date End Date Taking? Authorizing Provider  acetaminophen (TYLENOL) 650 MG CR tablet Take 650 mg by mouth every 8 (eight) hours as needed for pain.   Yes [provider]  albuterol (PROVENTIL) (2.5 MG/3ML) 0.083% nebulizer solution INHALE THE CONTENTS OF 1 VIAL VIA NEBULIZER EVERY 6 HOURS AS NEEDED FOR WHEEZING OR SHORTNESS OF BREATH Patient taking differently: Take 2.5 mg by nebulization every 6 (six) hours as needed for wheezing or shortness of breath.  02/25/18  Yes Collene Gobble, MD  antiseptic oral rinse (BIOTENE) LIQD 15 mLs by Mouth Rinse route 3 (three) times daily.   Yes [provider]  ARNUITY ELLIPTA 200 MCG/ACT AEPB INHALE 1 PUFF INTO THE LUNGS DAILY Patient taking differently: INHALE 1 PUFF INTO THE LUNGS DAILY 09/11/17  Yes Byrum, Rose Fillers, MD  atorvastatin (LIPITOR) 10 MG tablet TAKE 1 TABLET(10 MG) BY MOUTH DAILY Patient taking differently: Take 10 mg by mouth daily at 6 PM.  08/24/17  Yes Dorothy Spark, MD  azithromycin (ZITHROMAX) 250 MG tablet 500mg  (two tablets) today, then 250mg  (1 tablet) for the next 4  days 03/22/18  Yes Lauraine Rinne, NP  Biotin 2500 MCG CAPS Take 1 tablet by mouth daily.   Yes [provider]  carvedilol (COREG) 3.125 MG tablet TAKE 1 TABLET(3.125 MG) BY MOUTH TWICE DAILY WITH A MEAL Patient taking differently: Take 3.125 mg by mouth 2 (two) times daily with a meal.  11/27/17  Yes Dorothy Spark, MD  cholecalciferol (VITAMIN D) 1000 units tablet Take 1,000 Units by mouth daily.   Yes [provider]  diphenoxylate-atropine (LOMOTIL) 2.5-0.025 MG tablet TAKE 2 TABLETS BY MOUTH FOUR TIMES DAILY AS NEEDED FOR DIARRHEA/ LOOSE STOOLS 08/03/17  Yes Ladell Pier, MD  esomeprazole (NEXIUM) 40 MG capsule Take 40 mg by mouth daily at 12 noon.   Yes [provider]  fluocinonide cream (LIDEX) 9.24 % Apply 1 application topically 2 (two) times daily as needed (For ezcema).    Yes [provider]  furosemide (LASIX) 40 MG tablet Take 1 tablet (40 mg total) by mouth daily. 12/10/17  Yes Dorothy Spark, MD  gabapentin (NEURONTIN) 300 MG capsule TAKE 1  CAPSULE BY MOUTH EVERY NIGHT AT BEDTIME 03/07/18  Yes Ladell Pier, MD  hydrOXYzine (VISTARIL) 25 MG capsule Take 1 capsule (25 mg total) by mouth 4 (four) times daily as needed for itching. 01/03/18  Yes Ladell Pier, MD  lidocaine-prilocaine (EMLA) cream Apply to port site one hour prior to use. Do not rub in. Cover with plastic. 10/12/16  Yes Ladell Pier, MD  loratadine (CLARITIN) 10 MG tablet Take 10 mg by mouth daily.   Yes [provider]  ondansetron (ZOFRAN-ODT) 4 MG disintegrating tablet Take 1 tablet (4 mg total) by mouth every 8 (eight) hours as needed for nausea or vomiting. 12/05/17  Yes Ladell Pier, MD  oxyCODONE-acetaminophen (PERCOCET/ROXICET) 5-325 MG tablet Take 1 tablet by mouth every 6 (six) hours as needed for severe pain. 08/15/17  Yes Ladell Pier, MD  Potassium Chloride ER 20 MEQ TBCR TAKE 1/2 TABLET BY MOUTH DAILY 03/19/18  Yes Ladell Pier, MD    predniSONE (DELTASONE) 10 MG tablet Take 2 tablets (20mg  total) daily for the next 5 days. Take in the AM with food. 03/22/18  Yes Lauraine Rinne, NP  PROAIR HFA 108 (90 Base) MCG/ACT inhaler INHALE 2 PUFFS INTO THE LUNGS FOUR TIMES DAILY AS NEEDED FOR WHEEZING Patient taking differently: Inhale 2 puffs into the lungs 4 (four) times daily as needed for wheezing.  03/13/17  Yes Collene Gobble, MD  pyridoxine (B-6) 100 MG tablet Take 100 mg by mouth daily.   Yes [provider]  STIOLTO RESPIMAT 2.5-2.5 MCG/ACT AERS INHALE 2 PUFFS INTO THE LUNGS DAILY Patient taking differently: Take 2 puffs by mouth daily.  09/11/17  Yes Collene Gobble, MD  traMADol (ULTRAM) 50 MG tablet Take 1 tablet (50 mg total) by mouth every 12 (twelve) hours as needed. for pain 09/26/17  Yes Ladell Pier, MD  vitamin B-12 (CYANOCOBALAMIN) 100 MCG tablet Take 100 mcg by mouth daily. Reported on 01/19/2016   Yes [provider]  zolpidem (AMBIEN) 5 MG tablet Take 1 tablet (5 mg total) by mouth at bedtime as needed for sleep. 08/01/17  Yes [name], NP    Family History Family History  Problem Relation Age of Onset  . Diabetes Father   . Lung cancer Father 68       metastasis to liver  . Cancer Father        liver  . Pancreatic cancer Mother 29  . Stroke Paternal Grandfather   . Heart disease Maternal Aunt   . Diabetes Paternal Uncle   . Heart Problems Maternal Grandmother   . Heart Problems Maternal Grandfather   . Diabetes Paternal Grandmother   . Stroke Paternal Grandmother   . Heart Problems Paternal Uncle   . Cancer Cousin        unknown type  . Breast cancer Cousin        dx. 49s  . Leukemia Cousin        dx. 16-17  . Colon cancer Neg Hx   . Stomach cancer Neg Hx     Social History Social History   Tobacco Use  . Smoking status: Former Smoker    Packs/day: 1.00    Years: 40.00    Pack years: 40.00    Types: Cigarettes    Last attempt to quit: 07/10/2013    Years since  quitting: 4.7  . Smokeless tobacco: Never Used  Substance Use Topics  . Alcohol use: Yes  Alcohol/week: 0.0 standard drinks    Comment: 1-2 a week  . Drug use: No     Allergies   Latex and Penicillins   Review of Systems Review of Systems  Constitutional: Negative.  Negative for chills and fever.  Respiratory: Positive for cough and shortness of breath.   Cardiovascular: Negative.  Negative for chest pain and leg swelling.  Gastrointestinal: Negative.  Negative for abdominal pain, diarrhea, nausea and vomiting.  Genitourinary: Negative.  Negative for dysuria and hematuria.  Skin: Negative.  Negative for color change and rash.  Neurological: Negative.  Negative for dizziness, syncope, weakness, light-headedness and headaches.  All other systems reviewed and are negative.    Physical Exam Updated Vital Signs BP (!) 165/86   Pulse 96   Temp 97.9 F (36.6 C) (Oral)   Resp (!) 23   SpO2 97%   Physical Exam  Constitutional: She is oriented to person, place, and time. She appears well-developed and well-nourished. She appears distressed.  HENT:  Head: Normocephalic and atraumatic.  Right Ear: External ear normal.  Left Ear: External ear normal.  Nose: Nose normal.  Mouth/Throat: Oropharynx is clear and moist.  Eyes: Pupils are equal, round, and reactive to light. EOM are normal.  Neck: Trachea normal, normal range of motion, full passive range of motion without pain and phonation normal. Neck supple. No tracheal deviation present.  Cardiovascular: Regular rhythm, intact distal pulses and normal pulses. Tachycardia present.  Pulses:      Dorsalis pedis pulses are 2+ on the right side, and 2+ on the left side.       Posterior tibial pulses are 2+ on the right side, and 2+ on the left side.  Pulmonary/Chest: Accessory muscle usage present. No respiratory distress. She has wheezes. She has rhonchi. She exhibits no tenderness and no deformity.  Abdominal: Soft. There is no  tenderness. There is no rigidity, no rebound and no guarding.  Musculoskeletal: Normal range of motion.       Right lower leg: Normal. She exhibits no tenderness and no edema.       Left lower leg: Normal. She exhibits no tenderness and no edema.  Feet:  Right Foot:  Protective Sensation: 3 sites tested. 3 sites sensed.  Left Foot:  Protective Sensation: 3 sites tested. 3 sites sensed.  Neurological: She is alert and oriented to person, place, and time. She has normal strength. No cranial nerve deficit or sensory deficit. GCS eye subscore is 4. GCS verbal subscore is 5. GCS motor subscore is 6.  Skin: Skin is warm and dry.  Psychiatric: She has a normal mood and affect. Her behavior is normal.     ED Treatments / Results  Labs (all labs ordered are listed, but only abnormal results are displayed) Labs Reviewed  CBC WITH DIFFERENTIAL/PLATELET - Abnormal; Notable for the following components:      Result Value   RBC 3.02 (*)    Hemoglobin 9.2 (*)    HCT 30.3 (*)    MCV 100.3 (*)    RDW 18.8 (*)    Lymphs Abs 0.5 (*)    All other components within normal limits  BASIC METABOLIC PANEL - Abnormal; Notable for the following components:   Chloride 96 (*)    CO2 35 (*)    Glucose, Bld 137 (*)    Creatinine, Ser 1.01 (*)    GFR calc non Af Amer 53 (*)    All other components within normal limits  BRAIN NATRIURETIC PEPTIDE  I-STAT TROPONIN, ED    EKG EKG Interpretation  Date/Time:  Saturday March 23 2018 13:08:35 EDT Ventricular Rate:  96 PR Interval:    QRS Duration: 94 QT Interval:  414 QTC Calculation: 524 R Axis:   99 Text Interpretation:  Sinus rhythm Sinus pause Right axis deviation Prolonged QT interval Baseline wander in lead(s) V6 Confirmed by Lacretia Leigh ([id number]) on 03/23/2018 3:32:10 PM   Radiology Dg Chest 2 View  Result Date: 03/23/2018 CLINICAL DATA:  Patient with shortness of breath EXAM: CHEST - 2 VIEW COMPARISON:  Chest radiograph 03/20/2018  FINDINGS: Right anterior chest wall Port-A-Cath is present tip projecting over the superior vena cava. Monitoring leads overlie the patient. Stable cardiomegaly. Aortic atherosclerosis. Bibasilar heterogeneous pulmonary opacities. No pleural effusion or pneumothorax. Thoracic spine degenerative changes posttraumatic deformity proximal right humerus. IMPRESSION: Bibasilar opacities favored to represent atelectasis. Infection not excluded. Electronically Signed   By: Lovey Newcomer M.D.   On: 03/23/2018 16:18   Ct Angio Chest Pe W And/or Wo Contrast  Result Date: 03/23/2018 CLINICAL DATA:  Shortness of breath with increased cough and wheezing. EXAM: CT ANGIOGRAPHY CHEST WITH CONTRAST TECHNIQUE: Multidetector CT imaging of the chest was performed using the standard protocol during bolus administration of intravenous contrast. Multiplanar CT image reconstructions and MIPs were obtained to evaluate the vascular anatomy. CONTRAST:  132mL ISOVUE-370 IOPAMIDOL (ISOVUE-370) INJECTION 76% COMPARISON:  02/23/2018, 10/16/2017 and 08/08/2015 and abdominal CT 03/03/2018 FINDINGS: Cardiovascular: Heart is normal size. There is calcified plaque over the thoracic aorta which is otherwise normal in caliber. Pulmonary arterial system is within normal without emboli. Mediastinum/Nodes: No mediastinal or hilar adenopathy. Remaining mediastinal structures are normal. Lungs/Pleura: Lungs are adequately inflated demonstrate mild centrilobular emphysematous disease. There is resolved bibasilar atelectasis. Stable small amount right pleural fluid. Subtle focal reticulonodular opacification over the lingula and sub solid nodular opacification over the superior segment left lower lobe. Airways are within normal. Upper Abdomen: Free fluid over the left upper quadrant unchanged. Calcified plaque over the abdominal aorta. Remaining visualized upper abdomen is unchanged. Musculoskeletal: Degenerate change of the spine. Old right humeral fracture.  Review of the MIP images confirms the above findings. IMPRESSION: No evidence of pulmonary embolism. Subtle reticulonodular density over the lingula and sub solid subcentimeter nodule opacification over the superior segment left lower lobe. Small stable right pleural effusion. Findings may be due to atypical infectious or inflammatory process. Recommend follow-up CT 6 weeks. Aortic Atherosclerosis (ICD10-I70.0) and Emphysema (ICD10-J43.9). Free fluid over the left upper quadrant unchanged. Electronically Signed   By: Marin Olp M.D.   On: 03/23/2018 16:39    Procedures Procedures (including critical care time)  Medications Ordered in ED Medications  methylPREDNISolone sodium succinate (SOLU-MEDROL) 125 mg/2 mL injection 125 mg (125 mg Intravenous Given 03/23/18 1455)  albuterol (PROVENTIL,VENTOLIN) solution continuous neb (10 mg/hr Nebulization Given 03/23/18 1449)  iopamidol (ISOVUE-370) 76 % injection (100 mLs  Contrast Given 03/23/18 1612)  albuterol (PROVENTIL) (2.5 MG/3ML) 0.083% nebulizer solution 5 mg (5 mg Nebulization Given 03/23/18 1813)  ipratropium (ATROVENT) nebulizer solution 0.5 mg (0.5 mg Nebulization Given 03/23/18 1813)     Initial Impression / Assessment and Plan / ED Course  I have reviewed the triage vital signs and the nursing notes.  Pertinent labs & imaging results that were available during my care of the patient were reviewed by me and considered in my medical decision making (see chart for details).  Clinical Course as of Mar 24 1823  Sat Mar 23, 2018  1416  Case discussed with Dr. Zenia Resides who agrees with workup at this time.   [BM]  [facility] hospitalist for admission. Will see patient in department and admit for COPD exacerbation.   [BM]    Clinical Course User Index [BM] Deliah Boston, PA-C   Patient with history of COPD presenting for increased shortness of breath over the past day and a half.  Seen by primary yesterday given 20 mg prednisone,  azithromycin, albuterol treatment and sent home with continued albuterol treatments.  Patient with continued shortness of breath today, given continues due to have emergency department without significant relief.  Patient with difficulty ambulating in room, O2 saturations dropped to the 70s per nursing staff with ambulation to toilet.  To be admitted to hospitalist service for COPD exacerbation.  CT Angio was performed due to concern of pulmonary embolism, patient with history of ovarian cancer undergoing active chemo treatment, no relief of shortness of breath with DuoNeb's at home and hypoxic and tachycardic here.  CTA was negative for pulmonary embolism.  CT and chest x-ray both showed multiple opacities, inflammation versus infection.  CBC without WBC elevation, also shows anemia, decreased from 11 to 9 over the past 2 weeks.   BMP shows slightly elevated creatinine we will give fluids. Troponin negative BNP within normal limits EKG confirmed by Dr. Zenia Resides  Patient's case discussed and patient also seen by Dr. Ashok Cordia who agrees with plan to admit patient for COPD exacerbation.   Patient has been admitted to hospitalist service by Dr. Jonnie Finner.  Note: Portions of this report may have been transcribed using voice recognition software. Every effort was made to ensure accuracy; however, inadvertent computerized transcription errors may still be present.  Final Clinical Impressions(s) / ED Diagnoses   Final diagnoses:  COPD exacerbation [facility])    ED Discharge Orders    None       Gari Crown 03/23/18 2034    Lacretia Leigh, MD 03/24/18 [phone]

## 2018-03-23 NOTE — Progress Notes (Signed)
Pharmacy Antibiotic Note  Lynn Ramirez is a 77 y.o. female admitted on 03/23/2018 with pneumonia.  Pharmacy has been consulted for Levaquin dosing.  Plan: Levaquin 750mg  IV q48h Follow renal function   Temp (24hrs), Avg:97.9 F (36.6 C), Min:97.9 F (36.6 C), Max:97.9 F (36.6 C)  Recent Labs  Lab 03/20/18 1037 03/23/18 1435  WBC 3.5* 5.6  CREATININE 0.89 1.01*    Estimated Creatinine Clearance: 46.1 mL/min (A) (by C-G formula based on SCr of 1.01 mg/dL (H)).    Allergies  Allergen Reactions  . Latex Rash    Latex gloves ONLY (pt cannot wear them- no reaction if someone else touches her with latex gloves on)  . Penicillins Other (See Comments)    welts Has patient had a PCN reaction causing immediate rash, facial/tongue/throat swelling, SOB or lightheadedness with hypotension: Yes  Has patient had a PCN reaction causing severe rash involving mucus membranes or skin necrosis:No Has patient had a PCN reaction that required hospitalization:No Has patient had a PCN reaction occurring within the last 10 years:No If all of the above answers are "NO", then may proceed with Cephalosporin use.     Antimicrobials this admission: 9/14 levaquin >>     Dose adjustments this admission:    Microbiology results:   Thank you for allowing pharmacy to be a part of this patient's care.  Everette Rank, PharmD 03/23/2018 7:22 PM

## 2018-03-23 NOTE — ED Notes (Signed)
ED TO INPATIENT HANDOFF REPORT  Name/Age/Gender Lynn Ramirez 77 y.o. female  Code Status Code Status History    Date Active Date Inactive Code Status Order ID Comments User Context   02/22/2018 2210 02/23/2018 1456 Full Code [id number]  Gwynne Edinger, MD Inpatient   10/08/2015 2012 10/11/2015 1733 Full Code [id number]  Orson Eva, MD Inpatient   08/08/2015 0027 08/11/2015 2100 Full Code [id number]  Reubin Milan, MD Inpatient   12/29/2014 2021 12/30/2014 1608 Full Code [id number]  Lahoma Crocker, MD Inpatient   11/02/2014 1258 11/03/2014 0353 Full Code [id number]  Marybelle Killings, MD [facility]    Advance Directive Documentation     Most Recent Value  Type of Advance Directive  Healthcare Power of Attorney, Living will  Pre-existing out of facility DNR order (yellow form or pink MOST form)  -  "MOST" Form in Place?  -      Home/SNF/Other Home  Chief Complaint SOB  Level of Care/Admitting Diagnosis ED Disposition    ED Disposition Condition [place]: [place] [[id number]]  Level of Care: Stepdown [14]  Admit to SDU based on following criteria: Respiratory Distress:  Frequent assessment and/or intervention to maintain adequate ventilation/respiration, pulmonary toilet, and respiratory treatment.  Diagnosis: Acute and chronic respiratory failure with hypoxia [facility]) [[id number]]  Admitting Physician: Espy, [place]  Attending Physician: Roney Jaffe [[id number]]  Estimated length of stay: past midnight tomorrow  Certification:: I certify this patient will need inpatient services for at least 2 midnights  PT Class (Do Not Modify): Inpatient [101]  PT Acc Code (Do Not Modify): Private [1]       Medical History Past Medical History:  Diagnosis Date  . CKD (chronic kidney disease), stage II   . COPD (chronic obstructive pulmonary disease) ([place])   . Coronary artery calcification seen on CT scan   . Difficulty sleeping   . Eczema    hands   . Emphysema   . GERD (gastroesophageal reflux disease)   . H/O hydronephrosis   . History of transfusion    age 61  . Hyperlipidemia   . Hypertension   . Ovarian cancer ([place]) dx'd 09/2014   metastatic - prior malignant R pleural effusion and peritoneal carcinomatosis  . S/p nephrectomy     Allergies Allergies  Allergen Reactions  . Latex Rash    Latex gloves ONLY (pt cannot wear them- no reaction if someone else touches her with latex gloves on)  . Penicillins Other (See Comments)    welts Has patient had a PCN reaction causing immediate rash, facial/tongue/throat swelling, SOB or lightheadedness with hypotension: Yes  Has patient had a PCN reaction causing severe rash involving mucus membranes or skin necrosis:No Has patient had a PCN reaction that required hospitalization:No Has patient had a PCN reaction occurring within the last 10 years:No If all of the above answers are "NO", then may proceed with Cephalosporin use.     IV Location/Drains/Wounds Patient Lines/Drains/Airways Status   Active Line/Drains/Airways    Name:   Placement date:   Placement time:   Site:   Days:   Implanted Port 03/13/18   03/13/18    0913    -   10   Peripheral IV 03/23/18 Left Antecubital   03/23/18    1435    Antecubital   less than 1          Labs/Imaging Results for orders placed or performed during the hospital encounter of 03/23/18 (from  the past 48 hour(s))  CBC with Differential     Status: Abnormal   Collection Time: 03/23/18  2:35 PM  Result Value Ref Range   WBC 5.6 4.0 - 10.5 K/uL   RBC 3.02 (L) 3.87 - 5.11 MIL/uL   Hemoglobin 9.2 (L) 12.0 - 15.0 g/dL   HCT 30.3 (L) 36.0 - 46.0 %   MCV 100.3 (H) 78.0 - 100.0 fL   MCH 30.5 26.0 - 34.0 pg   MCHC 30.4 30.0 - 36.0 g/dL   RDW 18.8 (H) 11.5 - 15.5 %   Platelets 305 150 - 400 K/uL   Neutrophils Relative % 83 %   Neutro Abs 4.7 1.7 - 7.7 K/uL   Lymphocytes Relative 9 %   Lymphs Abs 0.5 (L) 0.7 - 4.0 K/uL   Monocytes Relative 8  %   Monocytes Absolute 0.4 0.1 - 1.0 K/uL   Eosinophils Relative 0 %   Eosinophils Absolute 0.0 0.0 - 0.7 K/uL   Basophils Relative 0 %   Basophils Absolute 0.0 0.0 - 0.1 K/uL    Comment: Performed at [facility], [place] [address], [place], [place] [address]  Basic metabolic panel     Status: Abnormal   Collection Time: 03/23/18  2:35 PM  Result Value Ref Range   Sodium 141 135 - 145 mmol/L   Potassium 4.5 3.5 - 5.1 mmol/L   Chloride 96 (L) 98 - 111 mmol/L   CO2 35 (H) 22 - 32 mmol/L   Glucose, Bld 137 (H) 70 - 99 mg/dL   BUN 15 8 - 23 mg/dL   Creatinine, Ser 1.01 (H) 0.44 - 1.00 mg/dL   Calcium 9.1 8.9 - 10.3 mg/dL   GFR calc non Af Amer 53 (L) >60 mL/min   GFR calc Af Amer >60 >60 mL/min    Comment: (NOTE) The eGFR has been calculated using the CKD EPI equation. This calculation has not been validated in all clinical situations. eGFR's persistently <60 mL/min signify possible Chronic Kidney Disease.    Anion gap 10 5 - 15    Comment: Performed at [facility], [place] [address] [place], [place] [address]  Brain natriuretic peptide     Status: None   Collection Time: 03/23/18  2:35 PM  Result Value Ref Range   B Natriuretic Peptide 65.5 0.0 - 100.0 pg/mL    Comment: Performed at [facility], [place] [address], [place], [place] [address]  I-Stat Troponin, ED (not at [facility])     Status: None   Collection Time: 03/23/18  2:58 PM  Result Value Ref Range   Troponin i, poc 0.00 0.00 - 0.08 ng/mL   Comment 3            Comment: Due to the release kinetics of cTnI, a negative result within the first hours of the onset of symptoms does not rule out myocardial infarction with certainty. If myocardial infarction is still suspected, repeat the test at appropriate intervals.    *Note: Due to a large number of results and/or encounters for the requested time period, some results have not been displayed. A complete set of results can be  found in Results Review.   Dg Chest 2 View  Result Date: 03/23/2018 CLINICAL DATA:  Patient with shortness of breath EXAM: CHEST - 2 VIEW COMPARISON:  Chest radiograph 03/20/2018 FINDINGS: Right anterior chest wall Port-A-Cath is present tip projecting over the superior vena cava. Monitoring leads overlie the patient. Stable cardiomegaly. Aortic  atherosclerosis. Bibasilar heterogeneous pulmonary opacities. No pleural effusion or pneumothorax. Thoracic spine degenerative changes posttraumatic deformity proximal right humerus. IMPRESSION: Bibasilar opacities favored to represent atelectasis. Infection not excluded. Electronically Signed   By: Lovey Newcomer M.D.   On: 03/23/2018 16:18   Ct Angio Chest Pe W And/or Wo Contrast  Result Date: 03/23/2018 CLINICAL DATA:  Shortness of breath with increased cough and wheezing. EXAM: CT ANGIOGRAPHY CHEST WITH CONTRAST TECHNIQUE: Multidetector CT imaging of the chest was performed using the standard protocol during bolus administration of intravenous contrast. Multiplanar CT image reconstructions and MIPs were obtained to evaluate the vascular anatomy. CONTRAST:  152m ISOVUE-370 IOPAMIDOL (ISOVUE-370) INJECTION 76% COMPARISON:  02/23/2018, 10/16/2017 and 08/08/2015 and abdominal CT 03/03/2018 FINDINGS: Cardiovascular: Heart is normal size. There is calcified plaque over the thoracic aorta which is otherwise normal in caliber. Pulmonary arterial system is within normal without emboli. Mediastinum/Nodes: No mediastinal or hilar adenopathy. Remaining mediastinal structures are normal. Lungs/Pleura: Lungs are adequately inflated demonstrate mild centrilobular emphysematous disease. There is resolved bibasilar atelectasis. Stable small amount right pleural fluid. Subtle focal reticulonodular opacification over the lingula and sub solid nodular opacification over the superior segment left lower lobe. Airways are within normal. Upper Abdomen: Free fluid over the left upper  quadrant unchanged. Calcified plaque over the abdominal aorta. Remaining visualized upper abdomen is unchanged. Musculoskeletal: Degenerate change of the spine. Old right humeral fracture. Review of the MIP images confirms the above findings. IMPRESSION: No evidence of pulmonary embolism. Subtle reticulonodular density over the lingula and sub solid subcentimeter nodule opacification over the superior segment left lower lobe. Small stable right pleural effusion. Findings may be due to atypical infectious or inflammatory process. Recommend follow-up CT 6 weeks. Aortic Atherosclerosis (ICD10-I70.0) and Emphysema (ICD10-J43.9). Free fluid over the left upper quadrant unchanged. Electronically Signed   By: DMarin OlpM.D.   On: 03/23/2018 16:39    Pending Labs [employer](From admission, onward)    Start     Ordered   Signed and Held  Creatinine, serum  (enoxaparin (LOVENOX)    CrCl >/= 30 ml/min)  Weekly,   R    Comments:  while on enoxaparin therapy    Signed and Held   Signed and Held  Basic metabolic panel  Tomorrow morning,   R     Signed and Held   Signed and Held  CBC  Tomorrow morning,   R     Signed and Held          Vitals/Pain Today's Vitals   03/23/18 1700 03/23/18 1800 03/23/18 1810 03/23/18 1920  BP: (!) 160/76 (!) 165/86  (!) 150/88  Pulse: 100 99 96 100  Resp: 16 (!) 22 (!) 23 (!) 27  Temp:      TempSrc:      SpO2: 92% 93% 97% 93%  PainSc:        Isolation Precautions No active isolations  Medications Medications  sodium chloride 0.9 % bolus 500 mL (500 mLs Intravenous New Bag/Given 03/23/18 1932)  levofloxacin (LEVAQUIN) IVPB 750 mg (has no administration in time range)  methylPREDNISolone sodium succinate (SOLU-MEDROL) 125 mg/2 mL injection 125 mg (125 mg Intravenous Given 03/23/18 1455)  albuterol (PROVENTIL,VENTOLIN) solution continuous neb (10 mg/hr Nebulization Given 03/23/18 1449)  iopamidol (ISOVUE-370) 76 % injection (100 mLs  Contrast Given 03/23/18  1612)  albuterol (PROVENTIL) (2.5 MG/3ML) 0.083% nebulizer solution 5 mg (5 mg Nebulization Given 03/23/18 1813)  ipratropium (ATROVENT) nebulizer solution 0.5 mg (0.5 mg Nebulization Given 03/23/18  1813)    Mobility walks

## 2018-03-23 NOTE — ED Notes (Signed)
Respiratory at bedside.

## 2018-03-23 NOTE — ED Notes (Addendum)
Pt assisted up to bedside toilet. Pt had significant difficulty when up to bedside. Pt had increased work of breathing with minimal exertion. Pts O2 sat dropped to 70% and took 3-5 minutes after resting to return to baseline.  Pts O2 increased from 3-4L via nasal cannula.

## 2018-03-23 NOTE — ED Notes (Signed)
Report given to Kristine RN

## 2018-03-23 NOTE — ED Triage Notes (Signed)
She c/o being "more short of breath than usual". She is short of breath, but does not have dyspnea.

## 2018-03-23 NOTE — ED Notes (Signed)
Provided patient ice water.

## 2018-03-23 NOTE — H&P (Signed)
[facility] History and Physical  Lynn Ramirez GQQ:[id number] DOB: 11/10/1940 DOA: 03/23/2018  Referring physician: Dr Zenia Resides PCP: Midge Minium, MD   Chief Complaint: Cough, wheezing and SOB  HPI: Rhyli Ramirez is a 77 y.o. female with hx of COPD, HTN, ovarian Cancer who presented to ED today c/o SOB worse than usual, unable to walk across the room.  She was seen in ED and rec'd prolonged neb Rx's w/ some improvement, but was still unable to ambulate w/o dropping O2sat's into the 80's.  We are asked to see for admission.   Patient states she was fine until 2 nights ago she awoke having trouble breathing. Then last night she had the same problems. Yesterday went to see her pulm MD Dr Lamonte Sakai in clinic and they started po Zpak and prednisone. She continue to worsen and today came to ED.  Pt also notes she had flu shot last week.  Also pt has hx of ovarian Ca f/b Dr Benay Spice and was dx'd 3.5 yrs ago, she states the cancer has been "kept in check" w/ chemo over the last 3 yrs. However 75mos ago she required removal of fluid from around her left lung, and this week on Wed 3 d ago she required a paracentesis, both of these procedures she states she hasn't had since her original cancer diagnosis in 2016.  She was started back on carboplatin in April 2019 when disease progression was noted w/ Chauncey Mann, then in July 2019 was started on gemcitabine and continues to get this now every 3 weeks. Seen by Dr Benay Spice 03/13/18 who noted he suspected she has progressive carcinomatosis.    Patient denies any recent fevers, chills, sweats, sore throat, ear pain, swollen glands in neck. No prod cough. Has nonprod cough.  Quit smoking 25yrs ago when dx'd w/ COPD.  Is on Home O2 at 2L.    Recent admit Aug 2019 x 24 hrs for mild COPD exacerbation   ROS  denies CP  no joint pain   no HA  no blurry vision  no rash  no diarrhea  no nausea/ vomiting  no dysuria  no difficulty voiding  no change in urine  color    Past Medical History  Past Medical History:  Diagnosis Date  . CKD (chronic kidney disease), stage II   . COPD (chronic obstructive pulmonary disease) ([place])   . Coronary artery calcification seen on CT scan   . Difficulty sleeping   . Eczema    hands  . Emphysema   . GERD (gastroesophageal reflux disease)   . H/O hydronephrosis   . History of transfusion    age 9  . Hyperlipidemia   . Hypertension   . Ovarian cancer ([place]) dx'd 09/2014   metastatic - prior malignant R pleural effusion and peritoneal carcinomatosis  . S/p nephrectomy    Past Surgical History  Past Surgical History:  Procedure Laterality Date  . ABDOMINAL HYSTERECTOMY    . CHOLECYSTECTOMY N/A 08/09/2015   Procedure: Attempted LAPAROSCOPIC coverted  open CHOLECYSTECTOMY WITH INTRAOPERATIVE CHOLANGIOGRAM;  Surgeon: Autumn Messing III, MD;  Location: [facility];  Service: General;  Laterality: N/A;  . HAND SURGERY Right 1980's  . LAPAROTOMY N/A 12/29/2014   Procedure:  LAPAROTOMY;  Surgeon: Everitt Amber, MD;  Location: [facility];  Service: Gynecology;  Laterality: N/A;  . NEPHRECTOMY    . ROBOTIC ASSISTED TOTAL HYSTERECTOMY WITH BILATERAL SALPINGO OOPHERECTOMY Bilateral 12/29/2014   Procedure: ROBOTIC ASSISTED TOTAL LAPAROSCOPIC HYSTERECTOMY WITH BILATERAL SALPINGO OOPHORECTOMY AND  OOMENTECTOMY WITH RADICAL TUMOR Corie Chiquito ;  Surgeon: Everitt Amber, MD;  Location: [facility];  Service: Gynecology;  Laterality: Bilateral;  . THORACENTESIS     several  . TONSILLECTOMY    . TUBAL LIGATION     Family History  Family History  Problem Relation Age of Onset  . Diabetes Father   . Lung cancer Father 57       metastasis to liver  . Cancer Father        liver  . Pancreatic cancer Mother 60  . Stroke Paternal Grandfather   . Heart disease Maternal Aunt   . Diabetes Paternal Uncle   . Heart Problems Maternal Grandmother   . Heart Problems Maternal Grandfather   . Diabetes Paternal Grandmother   . Stroke Paternal Grandmother    . Heart Problems Paternal Uncle   . Cancer Cousin        unknown type  . Breast cancer Cousin        dx. 101s  . Leukemia Cousin        dx. 16-17  . Colon cancer Neg Hx   . Stomach cancer Neg Hx    Social History  reports that she quit smoking about 4 years ago. Her smoking use included cigarettes. She has a 40.00 pack-year smoking history. She has never used smokeless tobacco. She reports that she drinks alcohol. She reports that she does not use drugs. Allergies  Allergies  Allergen Reactions  . Latex Rash    Latex gloves ONLY (pt cannot wear them- no reaction if someone else touches her with latex gloves on)  . Penicillins Other (See Comments)    welts Has patient had a PCN reaction causing immediate rash, facial/tongue/throat swelling, SOB or lightheadedness with hypotension: Yes  Has patient had a PCN reaction causing severe rash involving mucus membranes or skin necrosis:No Has patient had a PCN reaction that required hospitalization:No Has patient had a PCN reaction occurring within the last 10 years:No If all of the above answers are "NO", then may proceed with Cephalosporin use.    Home medications Prior to Admission medications   Medication Sig Start Date End Date Taking? Authorizing Provider  acetaminophen (TYLENOL) 650 MG CR tablet Take 650 mg by mouth every 8 (eight) hours as needed for pain.   Yes [provider]  albuterol (PROVENTIL) (2.5 MG/3ML) 0.083% nebulizer solution INHALE THE CONTENTS OF 1 VIAL VIA NEBULIZER EVERY 6 HOURS AS NEEDED FOR WHEEZING OR SHORTNESS OF BREATH Patient taking differently: Take 2.5 mg by nebulization every 6 (six) hours as needed for wheezing or shortness of breath.  02/25/18  Yes Collene Gobble, MD  antiseptic oral rinse (BIOTENE) LIQD 15 mLs by Mouth Rinse route 3 (three) times daily.   Yes [provider]  ARNUITY ELLIPTA 200 MCG/ACT AEPB INHALE 1 PUFF INTO THE LUNGS DAILY Patient taking differently: INHALE 1 PUFF  INTO THE LUNGS DAILY 09/11/17  Yes Byrum, Rose Fillers, MD  atorvastatin (LIPITOR) 10 MG tablet TAKE 1 TABLET(10 MG) BY MOUTH DAILY Patient taking differently: Take 10 mg by mouth daily at 6 PM.  08/24/17  Yes Dorothy Spark, MD  azithromycin (ZITHROMAX) 250 MG tablet 500mg  (two tablets) today, then 250mg  (1 tablet) for the next 4 days 03/22/18  Yes Lauraine Rinne, NP  Biotin 2500 MCG CAPS Take 1 tablet by mouth daily.   Yes [provider]  carvedilol (COREG) 3.125 MG tablet TAKE 1 TABLET(3.125 MG) BY MOUTH TWICE DAILY WITH A MEAL  Patient taking differently: Take 3.125 mg by mouth 2 (two) times daily with a meal.  11/27/17  Yes Dorothy Spark, MD  cholecalciferol (VITAMIN D) 1000 units tablet Take 1,000 Units by mouth daily.   Yes [provider]  diphenoxylate-atropine (LOMOTIL) 2.5-0.025 MG tablet TAKE 2 TABLETS BY MOUTH FOUR TIMES DAILY AS NEEDED FOR DIARRHEA/ LOOSE STOOLS 08/03/17  Yes Ladell Pier, MD  esomeprazole (NEXIUM) 40 MG capsule Take 40 mg by mouth daily at 12 noon.   Yes [provider]  fluocinonide cream (LIDEX) 1.61 % Apply 1 application topically 2 (two) times daily as needed (For ezcema).    Yes [provider]  furosemide (LASIX) 40 MG tablet Take 1 tablet (40 mg total) by mouth daily. 12/10/17  Yes Dorothy Spark, MD  gabapentin (NEURONTIN) 300 MG capsule TAKE 1 CAPSULE BY MOUTH EVERY NIGHT AT BEDTIME 03/07/18  Yes Ladell Pier, MD  hydrOXYzine (VISTARIL) 25 MG capsule Take 1 capsule (25 mg total) by mouth 4 (four) times daily as needed for itching. 01/03/18  Yes Ladell Pier, MD  lidocaine-prilocaine (EMLA) cream Apply to port site one hour prior to use. Do not rub in. Cover with plastic. 10/12/16  Yes Ladell Pier, MD  loratadine (CLARITIN) 10 MG tablet Take 10 mg by mouth daily.   Yes [provider]  ondansetron (ZOFRAN-ODT) 4 MG disintegrating tablet Take 1 tablet (4 mg total) by mouth every 8 (eight) hours as needed  for nausea or vomiting. 12/05/17  Yes Ladell Pier, MD  oxyCODONE-acetaminophen (PERCOCET/ROXICET) 5-325 MG tablet Take 1 tablet by mouth every 6 (six) hours as needed for severe pain. 08/15/17  Yes Ladell Pier, MD  Potassium Chloride ER 20 MEQ TBCR TAKE 1/2 TABLET BY MOUTH DAILY 03/19/18  Yes Ladell Pier, MD  predniSONE (DELTASONE) 10 MG tablet Take 2 tablets (20mg  total) daily for the next 5 days. Take in the AM with food. 03/22/18  Yes Lauraine Rinne, NP  PROAIR HFA 108 (90 Base) MCG/ACT inhaler INHALE 2 PUFFS INTO THE LUNGS FOUR TIMES DAILY AS NEEDED FOR WHEEZING Patient taking differently: Inhale 2 puffs into the lungs 4 (four) times daily as needed for wheezing.  03/13/17  Yes Collene Gobble, MD  pyridoxine (B-6) 100 MG tablet Take 100 mg by mouth daily.   Yes [provider]  STIOLTO RESPIMAT 2.5-2.5 MCG/ACT AERS INHALE 2 PUFFS INTO THE LUNGS DAILY Patient taking differently: Take 2 puffs by mouth daily.  09/11/17  Yes Collene Gobble, MD  traMADol (ULTRAM) 50 MG tablet Take 1 tablet (50 mg total) by mouth every 12 (twelve) hours as needed. for pain 09/26/17  Yes Ladell Pier, MD  vitamin B-12 (CYANOCOBALAMIN) 100 MCG tablet Take 100 mcg by mouth daily. Reported on 01/19/2016   Yes [provider]  zolpidem (AMBIEN) 5 MG tablet Take 1 tablet (5 mg total) by mouth at bedtime as needed for sleep. 08/01/17  Yes [name], NP   Liver Function Tests No results for input(s): AST, ALT, ALKPHOS, BILITOT, PROT, ALBUMIN in the last 168 hours. No results for input(s): LIPASE, AMYLASE in the last 168 hours. CBC Recent Labs  Lab 03/20/18 1037 03/23/18 1435  WBC 3.5* 5.6  NEUTROABS 1.9 4.7  HGB 9.1* 9.2*  HCT 29.4* 30.3*  MCV 100.3 100.3*  PLT 313 096   Basic Metabolic Panel Recent Labs  Lab 03/20/18 1037 03/23/18 1435  NA 142 141  K 4.2 4.5  CL 97* 96*  CO2 34* 35*  GLUCOSE 102* 137*  BUN 10 15  CREATININE 0.89 1.01*  CALCIUM 9.2 9.1     Vitals:    03/23/18 1655 03/23/18 1700 03/23/18 1800 03/23/18 1810  BP:  (!) 160/76 (!) 165/86   Pulse:  100 99 96  Resp:  16 (!) 22 (!) 23  Temp:      TempSrc:      SpO2: 93% 92% 93% 97%   Exam: Gen sig SOB, not in severe distress, using some acc muscles No rash, cyanosis or gangrene Sclera anicteric, throat clear  No jvd or bruits Chest air movement reduced throughout, diffuse coarse exp wheezing, diffusely scattered coarse rales bilat lung fields post, no bronch BS RRR no MRG  Abd soft ntnd no mass or ascites +bs GU defer MS no joint effusions or deformity Ext no leg or UE edema / no wounds or ulcers Neuro is alert, Ox 3 , nf     Home meds:  - atorvastatin 10/ carvedilol 3.125 bid/ furosemide 40 qd/ KDur 10 qd/ esomeprazole qd  - albuterol nebs prn/ arnuity ellipta 1 puff daily/ prednisone taper/ azithromycin pack qd/ stiolto respimat 2 puff qd  - tramadol 50 bid prn/ oxycodone-aceta qid prn/ gabapentin 300 hs  - vitamins/ nebs/ prn's  BUN 15  Cr 1.0  K 4.5 CO2 35 (was 34 a few days ago)  NA 141 K 4.5  WBC 5k  Hb 9.2  plt 305  BNP 65  Trop 0.00  EKG (independ reviewed) > NSR, prolonged QTc 524 msec CXR (independ reviewed) > bibasilar densities prob atx, can't exclude infection CT chest> Subtle reticulonodular density over the lingula and sub solid subcentimeter nodule opacification over the superior segment left lower lobe. Small stable right pleural effusion. Findings may be due to atypical infectious or inflammatory process. +COPD and atherosclerosis.   DVT proph: lovenox Code status: full Pt status: inpatient  Assessment: 1. Dyspnea/ cough/ acute on chronic resp failure/ prob bronchitis vs early PNA/ COPD exacerbation: plan admit, IV abx levaquin, IV steroids, duoneb scheduled and prn alb nebs, O2 support.  No evidence of CHF/ pulm edema by labs/ xrays.  2. Ovarian cancer: recently has worsened and required L thora 2 mos back and this week had paracentesis. On gemcitabine per Dr  Benay Spice. CBC ok.   3. HTN - will hold BB given wheezing, substitute hydralazine 50 tid. BP's high.  No need for lasix will hold for now. 4. COPD / chron resp failure/ CO2 retention: on home O2 5. SP L nephrectomy    Plan - as above       [place] D [facility] Pager [phone]   If 7PM-7AM, please contact night-coverage [email or web address] Password [email or web address] 03/23/2018, 6:50 PM

## 2018-03-23 NOTE — ED Notes (Signed)
Pt returned from CT °

## 2018-03-23 NOTE — Telephone Encounter (Signed)
Call from [facility]  - normally 2L pulse 97%. Yesterday AECOPD and seen in office and given  Steroid/abx. Now unable to keep pulse ox >90% despite 2L [place].   Plan Go to closest ER     SIGNATURE    Dr. Brand Males, M.D., F.C.C.P,  Pulmonary and Critical Care Medicine Staff Physician, [place] Director - Interstitial Lung Disease  Program  Pulmonary [place] at [place], [place], [address]  Pager: [phone], If no answer or between  15:00h - 7:00h: call [phone] Telephone: [phone]  11:37 AM 03/23/2018

## 2018-03-24 ENCOUNTER — Inpatient Hospital Stay (HOSPITAL_COMMUNITY): Payer: Medicare Other

## 2018-03-24 ENCOUNTER — Other Ambulatory Visit: Payer: Self-pay

## 2018-03-24 DIAGNOSIS — I340 Nonrheumatic mitral (valve) insufficiency: Secondary | ICD-10-CM

## 2018-03-24 LAB — ECHOCARDIOGRAM COMPLETE
HEIGHTINCHES: 61 in
Weight: 2730.18 oz

## 2018-03-24 LAB — MRSA PCR SCREENING: MRSA BY PCR: NEGATIVE

## 2018-03-24 MED ORDER — FUROSEMIDE 40 MG PO TABS
40.0000 mg | ORAL_TABLET | Freq: Every day | ORAL | Status: DC
  Administered 2018-03-24 – 2018-03-27 (×4): 40 mg via ORAL
  Filled 2018-03-24 (×4): qty 1

## 2018-03-24 MED ORDER — FUROSEMIDE 10 MG/ML IJ SOLN
40.0000 mg | Freq: Once | INTRAMUSCULAR | Status: AC
  Administered 2018-03-24: 40 mg via INTRAVENOUS
  Filled 2018-03-24: qty 4

## 2018-03-24 MED ORDER — SODIUM CHLORIDE 0.9% FLUSH
10.0000 mL | Freq: Two times a day (BID) | INTRAVENOUS | Status: DC
  Administered 2018-03-25 – 2018-03-27 (×4): 10 mL

## 2018-03-24 MED ORDER — ORAL CARE MOUTH RINSE
15.0000 mL | Freq: Two times a day (BID) | OROMUCOSAL | Status: DC
  Administered 2018-03-25 – 2018-03-26 (×2): 15 mL via OROMUCOSAL

## 2018-03-24 MED ORDER — METHYLPREDNISOLONE SODIUM SUCC 125 MG IJ SOLR
60.0000 mg | Freq: Three times a day (TID) | INTRAMUSCULAR | Status: DC
  Administered 2018-03-24 – 2018-03-25 (×3): 60 mg via INTRAVENOUS
  Filled 2018-03-24 (×3): qty 2

## 2018-03-24 MED ORDER — ALBUTEROL (5 MG/ML) CONTINUOUS INHALATION SOLN
10.0000 mg/h | INHALATION_SOLUTION | RESPIRATORY_TRACT | Status: DC
  Administered 2018-03-24: 10 mg/h via RESPIRATORY_TRACT
  Filled 2018-03-24: qty 20

## 2018-03-24 MED ORDER — MOMETASONE FURO-FORMOTEROL FUM 100-5 MCG/ACT IN AERO
2.0000 | INHALATION_SPRAY | Freq: Two times a day (BID) | RESPIRATORY_TRACT | Status: DC
  Filled 2018-03-24: qty 8.8

## 2018-03-24 MED ORDER — CARVEDILOL 3.125 MG PO TABS
3.1250 mg | ORAL_TABLET | Freq: Two times a day (BID) | ORAL | Status: DC
  Administered 2018-03-24 – 2018-03-27 (×6): 3.125 mg via ORAL
  Filled 2018-03-24 (×6): qty 1

## 2018-03-24 MED ORDER — CHLORHEXIDINE GLUCONATE CLOTH 2 % EX PADS
6.0000 | MEDICATED_PAD | Freq: Every day | CUTANEOUS | Status: DC
  Administered 2018-03-26 – 2018-03-27 (×2): 6 via TOPICAL

## 2018-03-24 MED ORDER — SODIUM CHLORIDE 0.9% FLUSH: 10.0000 mL | INTRAVENOUS | Status: DC | PRN

## 2018-03-24 NOTE — Progress Notes (Signed)
  Echocardiogram 2D Echocardiogram has been performed.  Lynn Ramirez F 03/24/2018, 12:52 PM

## 2018-03-24 NOTE — Progress Notes (Addendum)
Patient ID: Lynn Ramirez, female   DOB: 12-19-1940, 77 y.o.   MRN: [id number]   PROGRESS NOTE    Kaliah Haddaway  IZT:[id number] DOB: August 28, 1940 DOA: 03/23/2018 PCP: Midge Minium, MD   Brief Narrative:  77 year old female with history of COPD, hypertension, ovarian cancer who presented on 03/23/2018 with worsening cough, wheezing and shortness of breath along with hypoxia.  Patient was recently seen in pulmonary clinic and was started on prednisone and Z-Pak but progressively got worse and was admitted for COPD exacerbation/pneumonia and started on antibiotics and IV steroids.   Assessment & Plan:   Principal Problem:   Acute on chronic respiratory failure with hypoxia (HCC) Active Problems:   Metastatic adenocarcinoma (HCC)   Primary peritoneal carcinomatosis (HCC)   Ovarian cancer ([place])   Chronic respiratory failure with hypoxia (HCC)   COPD GOLD III B   Essential hypertension   PNA (pneumonia)   Acute and chronic respiratory failure with hypoxia (HCC)  Acute on chronic hypoxic respiratory failure probably secondary to COPD exacerbation/pneumonia -Currently still on 5 L oxygen via nasal cannula.  Wean off as able.  Incentive spirometry -We will get 2D echo  COPD exacerbation -Only slight improvement.  Increase her Medrol to 60 mg IV every 8 hours.  Continue duo nebs.  Add Dulera. -Outpatient follow-up with pulmonary.  If symptoms do not improve and respiratory status worsens, will get inpatient pulmonary evaluation  Probable left lobar bacterial pneumonia -CT chest showed findings suggestive of atypical infection versus inflammatory process but no PE. -Continue Levaquin.  Outpatient follow-up with pulmonary  Ovarian cancer -Patient recently had a paracentesis and also had thoracentesis 2 months back -On gemcitabine per Dr. Learta Codding.  Outpatient follow-up with Dr. Sherill/oncology  Hypertension -Monitor blood pressure.  Resume Coreg and Lasix.  DVT prophylaxis:  Lovenox Code Status: Full  family Communication: Spoke with patient and sister bedside Disposition Plan: Depends on clinical outcome  Consultants: None  Procedures: None Antimicrobials: Levaquin from 03/23/2018 onwards  Subjective: Patient seen and examined at bedside.  She feels slightly better but is still extremely short of breath with even minimal exertion.  No overnight fever, nausea or vomiting.  No current chest pain.  Objective: Vitals:   03/24/18 0600 03/24/18 0700 03/24/18 0800 03/24/18 0900  BP: (!) 151/75 (!) 151/78  (!) 151/78  Pulse: (!) 110 (!) 116  (!) 114  Resp: (!) 28 (!) 24  (!) 35  Temp:   97.7 F (36.5 C)   TempSrc:   Oral   SpO2: 100% 98%  96%  Weight:      Height:        Intake/Output Summary (Last 24 hours) at 03/24/2018 1032 Last data filed at 03/24/2018 0600 Gross per 24 hour  Intake 600 ml  Output 300 ml  Net 300 ml   Filed Weights   03/23/18 2122 03/24/18 0400  Weight: 77.4 kg 77.4 kg    Examination:  General exam: Appears calm and comfortable.  No distress Respiratory system: Bilateral decreased breath sounds at bases with scattered crackles, some expiratory wheezing.  Tachypneic Cardiovascular system: S1 & S2 heard, tachycardic  gastrointestinal system: Abdomen is nondistended, soft and nontender. Normal bowel sounds heard. Extremities: No cyanosis, clubbing; trace edema Central nervous system: Alert and oriented. No focal neurological deficits. Moving extremities Skin: No rashes, lesions or ulcers Psychiatry: Judgement and insight appear normal. Mood & affect appropriate.     Data Reviewed: I have personally reviewed following labs and imaging studies  CBC: Recent Labs  Lab 03/20/18 1037 03/23/18 1435  WBC 3.5* 5.6  NEUTROABS 1.9 4.7  HGB 9.1* 9.2*  HCT 29.4* 30.3*  MCV 100.3 100.3*  PLT 313 767   Basic Metabolic Panel: Recent Labs  Lab 03/20/18 1037 03/23/18 1435  NA 142 141  K 4.2 4.5  CL 97* 96*  CO2 34* 35*    GLUCOSE 102* 137*  BUN 10 15  CREATININE 0.89 1.01*  CALCIUM 9.2 9.1   GFR: Estimated Creatinine Clearance: 46 mL/min (A) (by C-G formula based on SCr of 1.01 mg/dL (H)). Liver Function Tests: No results for input(s): AST, ALT, ALKPHOS, BILITOT, PROT, ALBUMIN in the last 168 hours. No results for input(s): LIPASE, AMYLASE in the last 168 hours. No results for input(s): AMMONIA in the last 168 hours. Coagulation Profile: No results for input(s): INR, PROTIME in the last 168 hours. Cardiac Enzymes: No results for input(s): CKTOTAL, CKMB, CKMBINDEX, TROPONINI in the last 168 hours. BNP (last 3 results) Recent Labs    12/17/17 1643  PROBNP 24.0   HbA1C: No results for input(s): HGBA1C in the last 72 hours. CBG: No results for input(s): GLUCAP in the last 168 hours. Lipid Profile: No results for input(s): CHOL, HDL, LDLCALC, TRIG, CHOLHDL, LDLDIRECT in the last 72 hours. Thyroid Function Tests: No results for input(s): TSH, T4TOTAL, FREET4, T3FREE, THYROIDAB in the last 72 hours. Anemia Panel: No results for input(s): VITAMINB12, FOLATE, FERRITIN, TIBC, IRON, RETICCTPCT in the last 72 hours. Sepsis Labs: No results for input(s): PROCALCITON, LATICACIDVEN in the last 168 hours.  Recent Results (from the past 240 hour(s))  Culture, body fluid-bottle     Status: None (Preliminary result)   Collection Time: 03/20/18  2:58 PM  Result Value Ref Range Status   Specimen Description PERITONEAL  Final   Special Requests NONE  Final   Culture   Final    NO GROWTH 3 DAYS Performed at [facility], [address], [place], [place] [address]    Report Status PENDING  Incomplete  Gram stain     Status: None   Collection Time: 03/20/18  2:58 PM  Result Value Ref Range Status   Specimen Description PERITONEAL  Final   Special Requests NONE  Final   Gram Stain   Final    NO WBC SEEN NO ORGANISMS SEEN Performed at [facility], [address], [place], [place] [address]     Report Status 03/20/2018 FINAL  Final  MRSA PCR Screening     Status: None   Collection Time: 03/24/18 12:17 AM  Result Value Ref Range Status   MRSA by PCR NEGATIVE NEGATIVE Final    Comment:        The GeneXpert MRSA Assay (FDA approved for NASAL specimens only), is one component of a comprehensive MRSA colonization surveillance program. It is not intended to diagnose MRSA infection nor to guide or monitor treatment for MRSA infections. Performed at [facility], [place] [address], [place], [place] [address]          Radiology Studies: Dg Chest 2 View  Result Date: 03/23/2018 CLINICAL DATA:  Patient with shortness of breath EXAM: CHEST - 2 VIEW COMPARISON:  Chest radiograph 03/20/2018 FINDINGS: Right anterior chest wall Port-A-Cath is present tip projecting over the superior vena cava. Monitoring leads overlie the patient. Stable cardiomegaly. Aortic atherosclerosis. Bibasilar heterogeneous pulmonary opacities. No pleural effusion or pneumothorax. Thoracic spine degenerative changes posttraumatic deformity proximal right humerus. IMPRESSION: Bibasilar opacities favored to represent atelectasis.  Infection not excluded. Electronically Signed   By: Lovey Newcomer M.D.   On: 03/23/2018 16:18   Ct Angio Chest Pe W And/or Wo Contrast  Result Date: 03/23/2018 CLINICAL DATA:  Shortness of breath with increased cough and wheezing. EXAM: CT ANGIOGRAPHY CHEST WITH CONTRAST TECHNIQUE: Multidetector CT imaging of the chest was performed using the standard protocol during bolus administration of intravenous contrast. Multiplanar CT image reconstructions and MIPs were obtained to evaluate the vascular anatomy. CONTRAST:  181mL ISOVUE-370 IOPAMIDOL (ISOVUE-370) INJECTION 76% COMPARISON:  02/23/2018, 10/16/2017 and 08/08/2015 and abdominal CT 03/03/2018 FINDINGS: Cardiovascular: Heart is normal size. There is calcified plaque over the thoracic aorta which is otherwise normal in  caliber. Pulmonary arterial system is within normal without emboli. Mediastinum/Nodes: No mediastinal or hilar adenopathy. Remaining mediastinal structures are normal. Lungs/Pleura: Lungs are adequately inflated demonstrate mild centrilobular emphysematous disease. There is resolved bibasilar atelectasis. Stable small amount right pleural fluid. Subtle focal reticulonodular opacification over the lingula and sub solid nodular opacification over the superior segment left lower lobe. Airways are within normal. Upper Abdomen: Free fluid over the left upper quadrant unchanged. Calcified plaque over the abdominal aorta. Remaining visualized upper abdomen is unchanged. Musculoskeletal: Degenerate change of the spine. Old right humeral fracture. Review of the MIP images confirms the above findings. IMPRESSION: No evidence of pulmonary embolism. Subtle reticulonodular density over the lingula and sub solid subcentimeter nodule opacification over the superior segment left lower lobe. Small stable right pleural effusion. Findings may be due to atypical infectious or inflammatory process. Recommend follow-up CT 6 weeks. Aortic Atherosclerosis (ICD10-I70.0) and Emphysema (ICD10-J43.9). Free fluid over the left upper quadrant unchanged. Electronically Signed   By: Marin Olp M.D.   On: 03/23/2018 16:39   Dg Chest Port 1 View  Result Date: 03/24/2018 CLINICAL DATA:  Shortness of breath today. EXAM: PORTABLE CHEST 1 VIEW COMPARISON:  Radiographs and CT yesterday. FINDINGS: Examination significantly rotated. Right chest port remains in place. Lungs are hyperinflated with central bronchial thickening. Heart size grossly unchanged from prior exam allowing for differences in rotation. Aortic atherosclerosis. Bibasilar atelectasis appears similar. Reticulonodular opacities on CT not well seen radiographically. No large pleural effusion or pneumothorax. IMPRESSION: Unchanged appearance of the chest allowing for patient rotation.  Hyperinflation with central bronchial thickening and bibasilar atelectasis. Electronically Signed   By: Keith Rake M.D.   On: 03/24/2018 05:38        Scheduled Meds: . atorvastatin  10 mg Oral q1800  . cholecalciferol  1,000 Units Oral Daily  . docusate sodium  100 mg Oral BID  . enoxaparin (LOVENOX) injection  40 mg Subcutaneous QHS  . gabapentin  300 mg Oral QHS  . ipratropium-albuterol  3 mL Nebulization Q6H  . loratadine  10 mg Oral Daily  . methylPREDNISolone (SOLU-MEDROL) injection  60 mg Intravenous BID  . pantoprazole  40 mg Oral Daily  . pyridoxine  100 mg Oral Daily  . vitamin B-12  100 mcg Oral Daily   Continuous Infusions: . sodium chloride 50 mL/hr at 03/23/18 2324  . albuterol Stopped (03/24/18 0645)  . levofloxacin (LEVAQUIN) IV Stopped (03/23/18 2330)     LOS: 1 day        Aline August, MD [facility] Pager [phone]  If 7PM-7AM, please contact night-coverage [email or web address] Password [facility] 03/24/2018, 10:32 AM

## 2018-03-25 ENCOUNTER — Telehealth: Payer: Self-pay | Admitting: Emergency Medicine

## 2018-03-25 DIAGNOSIS — C5700 Malignant neoplasm of unspecified fallopian tube: Secondary | ICD-10-CM

## 2018-03-25 DIAGNOSIS — D638 Anemia in other chronic diseases classified elsewhere: Secondary | ICD-10-CM

## 2018-03-25 DIAGNOSIS — C799 Secondary malignant neoplasm of unspecified site: Secondary | ICD-10-CM

## 2018-03-25 DIAGNOSIS — J9621 Acute and chronic respiratory failure with hypoxia: Secondary | ICD-10-CM

## 2018-03-25 DIAGNOSIS — J441 Chronic obstructive pulmonary disease with (acute) exacerbation: Secondary | ICD-10-CM

## 2018-03-25 DIAGNOSIS — Z9221 Personal history of antineoplastic chemotherapy: Secondary | ICD-10-CM

## 2018-03-25 DIAGNOSIS — R188 Other ascites: Secondary | ICD-10-CM

## 2018-03-25 DIAGNOSIS — J910 Malignant pleural effusion: Secondary | ICD-10-CM

## 2018-03-25 LAB — CBC WITH DIFFERENTIAL/PLATELET
BASOS PCT: 0 %
Basophils Absolute: 0 10*3/uL (ref 0.0–0.1)
Eosinophils Absolute: 0 10*3/uL (ref 0.0–0.7)
Eosinophils Relative: 0 %
HEMATOCRIT: 29.4 % — AB (ref 36.0–46.0)
HEMOGLOBIN: 8.8 g/dL — AB (ref 12.0–15.0)
LYMPHS PCT: 8 %
Lymphs Abs: 0.6 10*3/uL — ABNORMAL LOW (ref 0.7–4.0)
MCH: 30.2 pg (ref 26.0–34.0)
MCHC: 29.9 g/dL — AB (ref 30.0–36.0)
MCV: 101 fL — AB (ref 78.0–100.0)
MONOS PCT: 7 %
Monocytes Absolute: 0.5 10*3/uL (ref 0.1–1.0)
NEUTROS ABS: 6.4 10*3/uL (ref 1.7–7.7)
Neutrophils Relative %: 85 %
Platelets: 310 10*3/uL (ref 150–400)
RBC: 2.91 MIL/uL — ABNORMAL LOW (ref 3.87–5.11)
RDW: 18.9 % — ABNORMAL HIGH (ref 11.5–15.5)
WBC: 7.6 10*3/uL (ref 4.0–10.5)

## 2018-03-25 LAB — COMPREHENSIVE METABOLIC PANEL
ALBUMIN: 2.7 g/dL — AB (ref 3.5–5.0)
ALK PHOS: 50 U/L (ref 38–126)
ALT: 19 U/L (ref 0–44)
AST: 25 U/L (ref 15–41)
Anion gap: 9 (ref 5–15)
BILIRUBIN TOTAL: 0.5 mg/dL (ref 0.3–1.2)
BUN: 17 mg/dL (ref 8–23)
CALCIUM: 8.4 mg/dL — AB (ref 8.9–10.3)
CO2: 40 mmol/L — ABNORMAL HIGH (ref 22–32)
Chloride: 91 mmol/L — ABNORMAL LOW (ref 98–111)
Creatinine, Ser: 0.99 mg/dL (ref 0.44–1.00)
GFR calc Af Amer: >60 mL/min (ref >60)
GFR, EST NON AFRICAN AMERICAN: 55 mL/min — AB (ref >60)
Glucose, Bld: 162 mg/dL — ABNORMAL HIGH (ref 70–99)
Potassium: 3.9 mmol/L (ref 3.5–5.1)
Sodium: 140 mmol/L (ref 135–145)
TOTAL PROTEIN: 6.3 g/dL — AB (ref 6.5–8.1)

## 2018-03-25 LAB — CULTURE, BODY FLUID-BOTTLE

## 2018-03-25 LAB — MAGNESIUM: Magnesium: 1.6 mg/dL — ABNORMAL LOW (ref 1.7–2.4)

## 2018-03-25 LAB — CULTURE, BODY FLUID W GRAM STAIN -BOTTLE: Culture: NO GROWTH

## 2018-03-25 MED ORDER — SODIUM CHLORIDE 0.9 % IV SOLN
INTRAVENOUS | Status: DC | PRN
  Administered 2018-03-25: 250 mL via INTRAVENOUS

## 2018-03-25 MED ORDER — METHYLPREDNISOLONE SODIUM SUCC 40 MG IJ SOLR
40.0000 mg | Freq: Three times a day (TID) | INTRAMUSCULAR | Status: DC
  Administered 2018-03-25 – 2018-03-26 (×3): 40 mg via INTRAVENOUS
  Filled 2018-03-25 (×3): qty 1

## 2018-03-25 MED ORDER — IPRATROPIUM-ALBUTEROL 0.5-2.5 (3) MG/3ML IN SOLN
3.0000 mL | Freq: Three times a day (TID) | RESPIRATORY_TRACT | Status: DC
  Administered 2018-03-25 – 2018-03-27 (×7): 3 mL via RESPIRATORY_TRACT
  Filled 2018-03-25 (×7): qty 3

## 2018-03-25 MED ORDER — MAGNESIUM SULFATE 2 GM/50ML IV SOLN
2.0000 g | Freq: Once | INTRAVENOUS | Status: AC
  Administered 2018-03-25: 2 g via INTRAVENOUS
  Filled 2018-03-25: qty 50

## 2018-03-25 NOTE — Progress Notes (Signed)
IP PROGRESS NOTE  Subjective:   Lynn Ramirez is well-known to me with a history of advanced stage Fallopian tube carcinoma.  She is currently being treated with gemcitabine chemotherapy, last given 03/13/2018.  She returned for an office visit 03/20/2018 and had clinical evidence of progressive ascites.  She was referred for a diagnostic/therapeutic paracentesis on 03/20/2018.  950 cc of fluid were removed.  The cytology revealed a rare atypical cell and was nondiagnostic.  She reports relief of abdominal distention following the paracentesis.  She developed increased dyspnea on 03/22/2018 and was evaluated by pulmonary medicine.  She was prescribed azithromycin, prednisone, and an inhaler.  Her symptoms progressed and she was seen in the emergency room on 03/23/2018.  CT of the chest was negative for pulmonary embolism.  There is a stable small right effusion.  Subtle reticular nodular density was noted at the lingula area area She was admitted for further evaluation.  She reports feeling better today.  Objective: Vital signs in last 24 hours: Blood pressure (!) 133/57, pulse 78, temperature 97.8 F (36.6 C), temperature source Oral, resp. rate (!) 23, height '5\' 1"'$  (1.549 m), weight 166 lb 14.2 oz (75.7 kg), SpO2 98 %.  Intake/Output from previous day: 09/15 0701 - 09/16 0700 In: 244.3 [P.O.:240; IV Piggyback:4.3] Out: 1850 [Urine:1850]  Physical Exam:  HEENT: Thrush Lungs: Bilateral expiratory wheeze Cardiac: Distant heart sounds, regular rate and rhythm Abdomen: Less distended Extremities: No leg edema  Portacath/PICC-without erythema  Lab Results: Recent Labs    03/25/18 0330 03/25/18 0442  WBC QUESTIONABLE RESULTS, RECOMMEND RECOLLECT TO VERIFY 7.6  HGB QUESTIONABLE RESULTS, RECOMMEND RECOLLECT TO VERIFY 8.8*  HCT QUESTIONABLE RESULTS, RECOMMEND RECOLLECT TO VERIFY 29.4*  PLT QUESTIONABLE RESULTS, RECOMMEND RECOLLECT TO VERIFY 310    BMET Recent Labs    03/25/18 0330  03/25/18 0545  NA QUESTIONABLE RESULTS, RECOMMEND RECOLLECT TO VERIFY 140  K QUESTIONABLE RESULTS, RECOMMEND RECOLLECT TO VERIFY 3.9  CL QUESTIONABLE RESULTS, RECOMMEND RECOLLECT TO VERIFY 91*  CO2 QUESTIONABLE RESULTS, RECOMMEND RECOLLECT TO VERIFY 40*  GLUCOSE QUESTIONABLE RESULTS, RECOMMEND RECOLLECT TO VERIFY 162*  BUN QUESTIONABLE RESULTS, RECOMMEND RECOLLECT TO VERIFY 17  CREATININE QUESTIONABLE RESULTS, RECOMMEND RECOLLECT TO VERIFY 0.99  CALCIUM QUESTIONABLE RESULTS, RECOMMEND RECOLLECT TO VERIFY 8.4*    No results found for: CEA1  Studies/Results: Dg Chest 2 View  Result Date: 03/23/2018 CLINICAL DATA:  Patient with shortness of breath EXAM: CHEST - 2 VIEW COMPARISON:  Chest radiograph 03/20/2018 FINDINGS: Right anterior chest wall Port-A-Cath is present tip projecting over the superior vena cava. Monitoring leads overlie the patient. Stable cardiomegaly. Aortic atherosclerosis. Bibasilar heterogeneous pulmonary opacities. No pleural effusion or pneumothorax. Thoracic spine degenerative changes posttraumatic deformity proximal right humerus. IMPRESSION: Bibasilar opacities favored to represent atelectasis. Infection not excluded. Electronically Signed   By: Lovey Newcomer M.D.   On: 03/23/2018 16:18   Ct Angio Chest Pe W And/or Wo Contrast  Result Date: 03/23/2018 CLINICAL DATA:  Shortness of breath with increased cough and wheezing. EXAM: CT ANGIOGRAPHY CHEST WITH CONTRAST TECHNIQUE: Multidetector CT imaging of the chest was performed using the standard protocol during bolus administration of intravenous contrast. Multiplanar CT image reconstructions and MIPs were obtained to evaluate the vascular anatomy. CONTRAST:  177m ISOVUE-370 IOPAMIDOL (ISOVUE-370) INJECTION 76% COMPARISON:  02/23/2018, 10/16/2017 and 08/08/2015 and abdominal CT 03/03/2018 FINDINGS: Cardiovascular: Heart is normal size. There is calcified plaque over the thoracic aorta which is otherwise normal in caliber.  Pulmonary arterial system is within normal without emboli. Mediastinum/Nodes: No  mediastinal or hilar adenopathy. Remaining mediastinal structures are normal. Lungs/Pleura: Lungs are adequately inflated demonstrate mild centrilobular emphysematous disease. There is resolved bibasilar atelectasis. Stable small amount right pleural fluid. Subtle focal reticulonodular opacification over the lingula and sub solid nodular opacification over the superior segment left lower lobe. Airways are within normal. Upper Abdomen: Free fluid over the left upper quadrant unchanged. Calcified plaque over the abdominal aorta. Remaining visualized upper abdomen is unchanged. Musculoskeletal: Degenerate change of the spine. Old right humeral fracture. Review of the MIP images confirms the above findings. IMPRESSION: No evidence of pulmonary embolism. Subtle reticulonodular density over the lingula and sub solid subcentimeter nodule opacification over the superior segment left lower lobe. Small stable right pleural effusion. Findings may be due to atypical infectious or inflammatory process. Recommend follow-up CT 6 weeks. Aortic Atherosclerosis (ICD10-I70.0) and Emphysema (ICD10-J43.9). Free fluid over the left upper quadrant unchanged. Electronically Signed   By: Marin Olp M.D.   On: 03/23/2018 16:39   Dg Chest Port 1 View  Result Date: 03/24/2018 CLINICAL DATA:  Shortness of breath today. EXAM: PORTABLE CHEST 1 VIEW COMPARISON:  Radiographs and CT yesterday. FINDINGS: Examination significantly rotated. Right chest port remains in place. Lungs are hyperinflated with central bronchial thickening. Heart size grossly unchanged from prior exam allowing for differences in rotation. Aortic atherosclerosis. Bibasilar atelectasis appears similar. Reticulonodular opacities on CT not well seen radiographically. No large pleural effusion or pneumothorax. IMPRESSION: Unchanged appearance of the chest allowing for patient rotation.  Hyperinflation with central bronchial thickening and bibasilar atelectasis. Electronically Signed   By: Keith Rake M.D.   On: 03/24/2018 05:38    Medications: I have reviewed the patient's current medications.  Assessment/Plan:  1. 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 Significant improvement in malignant right pleural effusion.  Status post robotic-assisted laparoscopic hysterectomy with bilateral salpingoophorectomy, omentectomy, radical tumor debulking 12/29/2014. Per Dr. Serita Grit office note 01/14/2015 cytoreduction was optimal with residual disease remaining only in the 1 mm implants on the small intestine. 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  MSI-stable, mutation burden-4, BRAF rearrangement,ERBB4, KRAS G12D Cycle 1 adjuvant  Taxol/carboplatin 01/20/2015 Cycle 2 adjuvant Taxol/carboplatin 02/10/2015 Cycle 3 adjuvant Taxol/carboplatin 03/10/2015 CA 125 on 1013 2016-42  CTs of the chest, abdomen, and pelvis 05/31/2015 with no evidence of progressive ovarian cancer  CTs of the chest, abdomen, and pelvis 08/07/2015 and 08/08/2015-no evidence of progressive ovarian cancer  Peritoneal studding noted at the time of the cholecystectomy procedure 08/09/2015 Elevated CA 125 10/07/2015  CT 10/07/2015 with stricturing at the sigmoid colon, constipation, and omental nodularity  Initiation of salvage weekly Taxol chemotherapy 10/13/2015  Taxol changed to every 2 weeks beginning 01/19/2016 due to painful neuropathy.  CT scans 07/19/2016 with no acute findings.  No features in the abdomen or pelvis to suggest recurrent disease.Stable mild fullness right intrarenal collecting system.  Elevated CA 125 treatment resumed with Taxol/Avastin on a 2 week schedule 09/27/2016  CT 02/12/2017-no evidence of carcinomatosis, no evidence of progressive metastatic disease  Taxol/Avastin continued every 2 weeks  CT 09/10/2017-increase in trace ascites, no evidence of progressive carcinomatosis, inflammatory changes at the lung bases with a new 3 mm right lower lobe nodule  Taxol/Avastin continued every 2 weeks  Progressive rise in the CA 125  Cycle 1 carboplatin 3-week schedule4/17/2019  Cycle 2 carboplatin 11/14/2017  Progressive malignant left pleural effusion June 2019  CT 01/07/2018-resolution of pulmonary nodule seen on prior examination. New small bilateral pleural effusions left greater than right with areas of atelectasis. Stable scattered mediastinal and hilar lymph nodes some partially calcified. Stable emphysematous changes and areas of pulmonary scarring. Small volume abdominal ascites but no omental or peritoneal surface disease. Small scattered mesenteric and retroperitoneal lymph nodes stable. Stable nodularity  left adrenal gland.  Cycle 1 gemcitabine 01/09/2018 (day 1/day 8 schedule)  Cycle 2 gemcitabine 01/30/2018  Cycle 3 gemcitabine 02/21/2018  CT 03/03/2018- moderate free fluid in the abdomen and pelvis, thickened walls of small bowel loops in the right lower quadrant  Cycle 4 gemcitabine 03/13/2018  CT 03/03/2018-small bilateral pleural effusions, moderate ascites, small bowel wall thickening in the right lower quadrant  Paracentesis 03/20/2018- negative cytology 2. COPD-followed by Dr. Lamonte Sakai. 3. Dyspnea secondary to COPD and the large right pleural effusion, status post therapeutic thoracentesis procedures 09/30/2014,10/09/2014, and 10/21/2014. Left thoracentesis 10/30/2014  Progressive left pleural effusion noted on chest x-ray 12/17/2017  Left thoracentesis 12/19/2017, cytology positive for metastatic adenocarcinoma 4. Left nephrectomy as a child 5. Delayed nausea following cycle 1 Taxol/carboplatin, Aloxi/Emend added with cycle 2 with improvement. 6. Right lower extremity edema, right calf pain 12/09/2014. Negative venous Doppler 12/09/2014. 7. Diffuse pruritus following cycle 1 adjuvant Taxol/carboplatin 01/20/2015, no rash, resolved with a steroid dose pack 8. Thrombocytopenia second to chemotherapy, the carboplatin was dose reduced with cycle 2 adjuvant Taxol/carboplatin 02/10/2015 9. Chemotherapy-induced peripheral neuropathy-painful peripheral neuropathy involving the feet 01/19/2016.  10. Admission with acute cholecystitis 08/07/2015, status post a cholecystectomy 08/09/2015 11. Admission 10/08/2015 with abdominal pain/constipation, improved with bowel rest and laxatives 12. Mild right hydronephrosis noted on the CT 10/07/2015. Renal ultrasound 12/16/2015 with no hydronephrosis noted.No hydronephrosis on CT 07/19/2016. 13. Anemia secondary to chronic disease and chemotherapy, status post a red cell transfusion 12/05/2017 14. Admission 02/22/2018 with increased dyspnea 15. Admission  03/23/2018 with a COPD flare  Ms. Daggs is admitted with respiratory failure, most likely secondary to a COPD exacerbation.  I have a low clinical suspicion for progression of fallopian tube carcinoma in the chest or gemcitabine related pneumonitis.  She developed increased ascites on a recent abdomen CT and underwent a therapeutic paracentesis.  The cytology was negative, but she understands the ascites is likely related to the fallopian tube carcinoma.  She has stable anemia secondary to chronic disease and chemotherapy.  The plan is to continue gemcitabine as scheduled on 04/03/2018.  Recommendations: 1.  Continue management of respiratory failure per internal medicine and pulmonary medicine 2.  Outpatient follow-up as scheduled at the Cancer center 04/03/2018.    LOS: 2 days   Betsy Coder, MD   03/25/2018, 8:14 AM

## 2018-03-25 NOTE — Telephone Encounter (Signed)
Called spoke with patient who is currently admitted to the hospital. Patient reported that the night after she saw Aaron Edelman NP in the office her symptoms worsened suddenly, resulting in visit to ED. Patient stated that she is feeling better.  Nothing further needed at this time; will sign and route to RB and Aaron Edelman NP to make them aware that patient was admitted for AECOPD.

## 2018-03-25 NOTE — Telephone Encounter (Signed)
Thanks for letting me know. Sorry to hear she was admitted. Thankful she is feeling better.   Wyn Quaker FNP

## 2018-03-25 NOTE — Telephone Encounter (Signed)
Patient returning call, CB is [phone]

## 2018-03-25 NOTE — Evaluation (Addendum)
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   Clinical Impression  The patient did ambulated x 90' with 3 liters O2 with sats low at 84% , recovered to 96% after sitting down.Pt admitted with above diagnosis. Pt currently with functional limitations due to the deficits listed below (see PT Problem List).  Pt will benefit from skilled PT to increase their independence and safety with mobility to allow discharge to the venue listed below.   Patient had just started OPPT and  Wants to return.     Follow Up Recommendations Outpatient PT    Equipment Recommendations  None recommended by PT    Recommendations for Other Services       Precautions / Restrictions Precautions Precautions: Fall Precaution Comments: minitor sats on O2      Mobility  Bed Mobility Overal bed mobility: Needs Assistance Bed Mobility: Supine to Sit     Supine to sit: Supervision        Transfers Overall transfer level: Needs assistance Equipment used: 4-wheeled walker Transfers: Sit to/from Stand Sit to Stand: Supervision            Ambulation/Gait Ambulation/Gait assistance: Min guard;+2 safety/equipment Gait Distance (Feet): 90 Feet Assistive device: 4-wheeled walker Gait Pattern/deviations: Step-through pattern     General Gait Details: stop fro 1 rest break standing, Sats 84%, HR 110. Sats returned >90% with rest.  Stairs            Wheelchair Mobility    Modified Rankin (Stroke Patients Only)        Balance                                             Pertinent Vitals/Pain Pain Assessment: No/denies pain    Home Living Family/patient expects to be discharged to:: Private residence Living Arrangements: Spouse/significant other Available Help at Discharge: Family;Available 24 hours/day Type of Home: House Home Access: Stairs to enter Entrance Stairs-Rails: None Entrance Stairs-Number of Steps: 2 Home Layout: One level Home Equipment: Walker - 4 wheels;Cane - single point      Prior Function Level of Independence: Independent with assistive device(s)         Comments: SPC in house,  rollator outside     Hand Dominance        Extremity/Trunk Assessment   Upper Extremity Assessment Upper Extremity Assessment: Overall WFL for tasks assessed    Lower Extremity Assessment Lower Extremity Assessment: Overall WFL for tasks assessed    Cervical / Trunk Assessment Cervical / Trunk Assessment: Normal  Communication   Communication: No difficulties(except dyspnea)  Cognition Arousal/Alertness: Awake/alert Behavior During Therapy: WFL for tasks assessed/performed Overall Cognitive Status: Within Functional Limits for tasks assessed                                        General Comments      Exercises     Assessment/Plan  PT Assessment Patient needs continued PT services  PT Problem List Decreased strength;Decreased activity tolerance;Cardiopulmonary status limiting activity;Decreased mobility       PT Treatment Interventions DME instruction;Gait training;Functional mobility training;Therapeutic activities;Patient/family education    PT Goals (Current goals can be found in the Care Plan section)  Acute Rehab PT Goals Patient Stated Goal: to go home PT Goal Formulation: With patient/family Time For Goal Achievement: 04/08/18 Potential to Achieve Goals: Good    Frequency Min 3X/week   Barriers to discharge         Co-evaluation               AM-PAC PT "6 Clicks" Daily Activity  Outcome Measure Difficulty turning over in bed (including adjusting bedclothes, sheets and blankets)?: None Difficulty moving from lying on back to sitting on the side of the bed? : None Difficulty sitting down on and standing up from a chair with arms (e.g., wheelchair, bedside commode, etc,.)?: A Little Help needed moving to and from a bed to chair (including a wheelchair)?: A Little Help needed walking in hospital room?: A Little Help needed climbing 3-5 steps with a railing? : Total 6 Click Score: 18    End of Session   Activity Tolerance: Patient tolerated treatment well Patient left: in chair;with call bell/phone within reach Nurse Communication: Mobility status PT Visit Diagnosis: Unsteadiness on feet (R26.81)    Time: 3748-2707 PT Time Calculation (min) (ACUTE ONLY): 35 min   Charges:   PT Evaluation $PT Eval Low Complexity: 1 Low PT Treatments $Gait Training: 8-22 mins        Tresa Endo PT Acute Rehabilitation Services Pager [phone] Office [phone]   Claretha Cooper 03/25/2018, 12:49 PM

## 2018-03-25 NOTE — Telephone Encounter (Signed)
lmtcb x1 for pt. 

## 2018-03-25 NOTE — Progress Notes (Signed)
Patient ID: Lynn Ramirez, female   DOB: [birth year], 77 y.o.   MRN: [id number]   PROGRESS NOTE    Dallie Patton  QIH:[id number] DOB: January 17, 1941 DOA: 03/23/2018 PCP: Midge Minium, MD   Brief Narrative:  77 year old female with history of COPD, hypertension, ovarian cancer who presented on 03/23/2018 with worsening cough, wheezing and shortness of breath along with hypoxia.  Patient was recently seen in pulmonary clinic and was started on prednisone and Z-Pak but progressively got worse and was admitted for COPD exacerbation/pneumonia and started on antibiotics and IV steroids.   Assessment & Plan:   Principal Problem:   Acute on chronic respiratory failure with hypoxia (HCC) Active Problems:   Metastatic adenocarcinoma (HCC)   Primary peritoneal carcinomatosis (HCC)   Ovarian cancer ([place])   Chronic respiratory failure with hypoxia (HCC)   COPD GOLD III B   Essential hypertension   PNA (pneumonia)   Acute and chronic respiratory failure with hypoxia (HCC)  Acute on chronic hypoxic respiratory failure probably secondary to COPD exacerbation/pneumonia -Improving.  Currently  on 3 L oxygen via nasal cannula.  Patient wears oxygen via nasal cannula at 2 L/min at home.  Incentive spirometry -PT eval.  Transfer patient to [facility].  COPD exacerbation -Feels much better.  Decrease Solu-Medrol to 40 mg IV every 8 hours.  Continue duo nebs and Dulera. -Outpatient follow-up with pulmonary.   Probable left lobar bacterial pneumonia -CT chest showed findings suggestive of atypical infection versus inflammatory process but no PE. -Continue Levaquin.  Outpatient follow-up with pulmonary  Hypomagnesemia -Replace.  Repeat a.m. labs  Ovarian cancer -Patient recently had a paracentesis and also had thoracentesis 2 months back -On gemcitabine per Dr. Learta Codding.  Oncology follow-up appreciated.  Hypertension -Monitor blood pressure.  Continue Coreg and Lasix.  DVT prophylaxis:  Lovenox Code Status: Full  family Communication: Spoke with patient and sister bedside Disposition Plan: Probable discharge in 1 to 2 days if symptoms improve.  Consultants: None  Procedures: None Antimicrobials: Levaquin from 03/23/2018 onwards  Subjective: Patient seen and examined at bedside.  She feels slightly better but is still extremely short of breath with even minimal exertion.  No overnight fever, nausea or vomiting.  No current chest pain.  Objective: Vitals:   03/25/18 0500 03/25/18 0600 03/25/18 0800 03/25/18 0817  BP:   (!) 157/98   Pulse:  78 (!) 101 93  Resp:  (!) 23 20 20   Temp:   97.9 F (36.6 C)   TempSrc:   Oral   SpO2:  98% 94% 95%  Weight: 75.7 kg     Height:        Intake/Output Summary (Last 24 hours) at 03/25/2018 0913 Last data filed at 03/25/2018 0200 Gross per 24 hour  Intake 244.25 ml  Output 1850 ml  Net -1605.75 ml   Filed Weights   03/23/18 2122 03/24/18 0400 03/25/18 0500  Weight: 77.4 kg 77.4 kg 75.7 kg    Examination:  General exam: No acute distress Respiratory system: Bilateral decreased breath sounds at bases with some expiratory wheezing.  Cardiovascular system: S1 & S2 heard, intermittently tachycardic gastrointestinal system: Abdomen is nondistended, soft and nontender. Normal bowel sounds heard. Extremities: No cyanosis; trace edema   Data Reviewed: I have personally reviewed following labs and imaging studies  CBC: Recent Labs  Lab 03/20/18 1037 03/23/18 1435 03/25/18 0330 03/25/18 0442  WBC 3.5* 5.6 QUESTIONABLE RESULTS, RECOMMEND RECOLLECT TO VERIFY 7.6  NEUTROABS 1.9 4.7 QUESTIONABLE RESULTS, RECOMMEND RECOLLECT TO VERIFY 6.4  HGB 9.1* 9.2* QUESTIONABLE RESULTS, RECOMMEND RECOLLECT TO VERIFY 8.8*  HCT 29.4* 30.3* QUESTIONABLE RESULTS, RECOMMEND RECOLLECT TO VERIFY 29.4*  MCV 100.3 100.3* QUESTIONABLE RESULTS, RECOMMEND RECOLLECT TO VERIFY 101.0*  PLT 313 305 QUESTIONABLE RESULTS, RECOMMEND RECOLLECT TO VERIFY 242    Basic Metabolic Panel: Recent Labs  Lab 03/20/18 1037 03/23/18 1435 03/25/18 0330 03/25/18 0545  NA 142 141 QUESTIONABLE RESULTS, RECOMMEND RECOLLECT TO VERIFY 140  K 4.2 4.5 QUESTIONABLE RESULTS, RECOMMEND RECOLLECT TO VERIFY 3.9  CL 97* 96* QUESTIONABLE RESULTS, RECOMMEND RECOLLECT TO VERIFY 91*  CO2 34* 35* QUESTIONABLE RESULTS, RECOMMEND RECOLLECT TO VERIFY 40*  GLUCOSE 102* 137* QUESTIONABLE RESULTS, RECOMMEND RECOLLECT TO VERIFY 162*  BUN 10 15 QUESTIONABLE RESULTS, RECOMMEND RECOLLECT TO VERIFY 17  CREATININE 0.89 1.01* QUESTIONABLE RESULTS, RECOMMEND RECOLLECT TO VERIFY 0.99  CALCIUM 9.2 9.1 QUESTIONABLE RESULTS, RECOMMEND RECOLLECT TO VERIFY 8.4*  MG  --   --  QUESTIONABLE RESULTS, RECOMMEND RECOLLECT TO VERIFY 1.6*   GFR: Estimated Creatinine Clearance: 46.4 mL/min (by C-G formula based on SCr of 0.99 mg/dL). Liver Function Tests: Recent Labs  Lab 03/25/18 0330 03/25/18 0545  AST QUESTIONABLE RESULTS, RECOMMEND RECOLLECT TO VERIFY 25  ALT QUESTIONABLE RESULTS, RECOMMEND RECOLLECT TO VERIFY 19  ALKPHOS QUESTIONABLE RESULTS, RECOMMEND RECOLLECT TO VERIFY 50  BILITOT QUESTIONABLE RESULTS, RECOMMEND RECOLLECT TO VERIFY 0.5  PROT QUESTIONABLE RESULTS, RECOMMEND RECOLLECT TO VERIFY 6.3*  ALBUMIN QUESTIONABLE RESULTS, RECOMMEND RECOLLECT TO VERIFY 2.7*   No results for input(s): LIPASE, AMYLASE in the last 168 hours. No results for input(s): AMMONIA in the last 168 hours. Coagulation Profile: No results for input(s): INR, PROTIME in the last 168 hours. Cardiac Enzymes: No results for input(s): CKTOTAL, CKMB, CKMBINDEX, TROPONINI in the last 168 hours. BNP (last 3 results) Recent Labs    12/17/17 1643  PROBNP 24.0   HbA1C: No results for input(s): HGBA1C in the last 72 hours. CBG: No results for input(s): GLUCAP in the last 168 hours. Lipid Profile: No results for input(s): CHOL, HDL, LDLCALC, TRIG, CHOLHDL, LDLDIRECT in the last 72 hours. Thyroid Function  Tests: No results for input(s): TSH, T4TOTAL, FREET4, T3FREE, THYROIDAB in the last 72 hours. Anemia Panel: No results for input(s): VITAMINB12, FOLATE, FERRITIN, TIBC, IRON, RETICCTPCT in the last 72 hours. Sepsis Labs: No results for input(s): PROCALCITON, LATICACIDVEN in the last 168 hours.  Recent Results (from the past 240 hour(s))  Culture, body fluid-bottle     Status: None   Collection Time: 03/20/18  2:58 PM  Result Value Ref Range Status   Specimen Description PERITONEAL  Final   Special Requests NONE  Final   Culture   Final    NO GROWTH 5 DAYS Performed at [facility], [address] [place], [place] [address]    Report Status 03/25/2018 FINAL  Final  Gram stain     Status: None   Collection Time: 03/20/18  2:58 PM  Result Value Ref Range Status   Specimen Description PERITONEAL  Final   Special Requests NONE  Final   Gram Stain   Final    NO WBC SEEN NO ORGANISMS SEEN Performed at [facility], [place] [address], [place], [place] [address]    Report Status 03/20/2018 FINAL  Final  MRSA PCR Screening     Status: None   Collection Time: 03/24/18 12:17 AM  Result Value Ref Range Status   MRSA by PCR NEGATIVE NEGATIVE Final    Comment:        The GeneXpert  MRSA Assay (FDA approved for NASAL specimens only), is one component of a comprehensive MRSA colonization surveillance program. It is not intended to diagnose MRSA infection nor to guide or monitor treatment for MRSA infections. Performed at [facility], [place] [address] [place], [place] [address]          Radiology Studies: Dg Chest 2 View  Result Date: 03/23/2018 CLINICAL DATA:  Patient with shortness of breath EXAM: CHEST - 2 VIEW COMPARISON:  Chest radiograph 03/20/2018 FINDINGS: Right anterior chest wall Port-A-Cath is present tip projecting over the superior vena cava. Monitoring leads overlie the patient. Stable cardiomegaly. Aortic atherosclerosis. Bibasilar  heterogeneous pulmonary opacities. No pleural effusion or pneumothorax. Thoracic spine degenerative changes posttraumatic deformity proximal right humerus. IMPRESSION: Bibasilar opacities favored to represent atelectasis. Infection not excluded. Electronically Signed   By: Lovey Newcomer M.D.   On: 03/23/2018 16:18   Ct Angio Chest Pe W And/or Wo Contrast  Result Date: 03/23/2018 CLINICAL DATA:  Shortness of breath with increased cough and wheezing. EXAM: CT ANGIOGRAPHY CHEST WITH CONTRAST TECHNIQUE: Multidetector CT imaging of the chest was performed using the standard protocol during bolus administration of intravenous contrast. Multiplanar CT image reconstructions and MIPs were obtained to evaluate the vascular anatomy. CONTRAST:  174mL ISOVUE-370 IOPAMIDOL (ISOVUE-370) INJECTION 76% COMPARISON:  02/23/2018, 10/16/2017 and 08/08/2015 and abdominal CT 03/03/2018 FINDINGS: Cardiovascular: Heart is normal size. There is calcified plaque over the thoracic aorta which is otherwise normal in caliber. Pulmonary arterial system is within normal without emboli. Mediastinum/Nodes: No mediastinal or hilar adenopathy. Remaining mediastinal structures are normal. Lungs/Pleura: Lungs are adequately inflated demonstrate mild centrilobular emphysematous disease. There is resolved bibasilar atelectasis. Stable small amount right pleural fluid. Subtle focal reticulonodular opacification over the lingula and sub solid nodular opacification over the superior segment left lower lobe. Airways are within normal. Upper Abdomen: Free fluid over the left upper quadrant unchanged. Calcified plaque over the abdominal aorta. Remaining visualized upper abdomen is unchanged. Musculoskeletal: Degenerate change of the spine. Old right humeral fracture. Review of the MIP images confirms the above findings. IMPRESSION: No evidence of pulmonary embolism. Subtle reticulonodular density over the lingula and sub solid subcentimeter nodule  opacification over the superior segment left lower lobe. Small stable right pleural effusion. Findings may be due to atypical infectious or inflammatory process. Recommend follow-up CT 6 weeks. Aortic Atherosclerosis (ICD10-I70.0) and Emphysema (ICD10-J43.9). Free fluid over the left upper quadrant unchanged. Electronically Signed   By: Marin Olp M.D.   On: 03/23/2018 16:39   Dg Chest Port 1 View  Result Date: 03/24/2018 CLINICAL DATA:  Shortness of breath today. EXAM: PORTABLE CHEST 1 VIEW COMPARISON:  Radiographs and CT yesterday. FINDINGS: Examination significantly rotated. Right chest port remains in place. Lungs are hyperinflated with central bronchial thickening. Heart size grossly unchanged from prior exam allowing for differences in rotation. Aortic atherosclerosis. Bibasilar atelectasis appears similar. Reticulonodular opacities on CT not well seen radiographically. No large pleural effusion or pneumothorax. IMPRESSION: Unchanged appearance of the chest allowing for patient rotation. Hyperinflation with central bronchial thickening and bibasilar atelectasis. Electronically Signed   By: Keith Rake M.D.   On: 03/24/2018 05:38        Scheduled Meds: . atorvastatin  10 mg Oral q1800  . carvedilol  3.125 mg Oral BID WC  . Chlorhexidine Gluconate Cloth  6 each Topical Daily  . cholecalciferol  1,000 Units Oral Daily  . docusate sodium  100 mg Oral BID  . enoxaparin (LOVENOX) injection  40  mg Subcutaneous QHS  . furosemide  40 mg Oral Daily  . gabapentin  300 mg Oral QHS  . ipratropium-albuterol  3 mL Nebulization Q6H  . loratadine  10 mg Oral Daily  . mouth rinse  15 mL Mouth Rinse BID  . methylPREDNISolone (SOLU-MEDROL) injection  60 mg Intravenous Q8H  . mometasone-formoterol  2 puff Inhalation BID  . pantoprazole  40 mg Oral Daily  . pyridoxine  100 mg Oral Daily  . sodium chloride flush  10-40 mL Intracatheter Q12H  . vitamin B-12  100 mcg Oral Daily   Continuous  Infusions: . albuterol Stopped (03/24/18 0645)  . levofloxacin (LEVAQUIN) IV Stopped (03/23/18 2330)  . magnesium sulfate 1 - 4 g bolus IVPB 2 g (03/25/18 2449)     LOS: 2 days        Aline August, MD [facility] Pager [phone]  If 7PM-7AM, please contact night-coverage [email or web address] Password [facility] 03/25/2018, 9:13 AM

## 2018-03-25 NOTE — Telephone Encounter (Signed)
Will close encounter, as nothing further is needed.  

## 2018-03-25 NOTE — Care Management Note (Signed)
Case Management Note  Patient Details  Name: Lynn Ramirez MRN: [id number] Date of Birth: 06/21/1941  Subjective/Objective:                  COPD exacerbation  Action/Plan: Lives at home with spouse and family close by. Will follow for progression Will follow for cm needs none present at this time. Expected Discharge Date:  03/27/18               Expected Discharge Plan:  Home/Self Care  In-House Referral:     Discharge planning Services  CM Consult  Post Acute Care Choice:    Choice offered to:     DME Arranged:    DME Agency:     HH Arranged:    HH Agency:     Status of Service:  In process, will continue to follow  If discussed at Long Length of Stay Meetings, dates discussed:    Additional Comments:  Leeroy Cha, RN 03/25/2018, 10:06 AM

## 2018-03-26 ENCOUNTER — Telehealth: Payer: Self-pay | Admitting: Cardiology

## 2018-03-26 ENCOUNTER — Inpatient Hospital Stay: Payer: Medicare Other | Admitting: Oncology

## 2018-03-26 ENCOUNTER — Encounter: Payer: Self-pay | Admitting: Physical Therapy

## 2018-03-26 LAB — CBC WITH DIFFERENTIAL/PLATELET
Basophils Absolute: 0 10*3/uL (ref 0.0–0.1)
Basophils Relative: 0 %
EOS ABS: 0 10*3/uL (ref 0.0–0.7)
EOS PCT: 0 %
HEMATOCRIT: 29.8 % — AB (ref 36.0–46.0)
HEMOGLOBIN: 9.1 g/dL — AB (ref 12.0–15.0)
LYMPHS ABS: 0.9 10*3/uL (ref 0.7–4.0)
LYMPHS PCT: 8 %
MCH: 31.1 pg (ref 26.0–34.0)
MCHC: 30.5 g/dL (ref 30.0–36.0)
MCV: 101.7 fL — ABNORMAL HIGH (ref 78.0–100.0)
MONO ABS: 0.9 10*3/uL (ref 0.1–1.0)
Monocytes Relative: 9 %
Neutro Abs: 8.6 10*3/uL (ref 1.7–7.7)
Neutrophils Relative %: 83 %
Platelets: 345 10*3/uL (ref 150–400)
RBC: 2.93 MIL/uL — AB (ref 3.87–5.11)
RDW: 18.8 % — AB (ref 11.5–15.5)
WBC: 10.4 10*3/uL (ref 4.0–10.5)

## 2018-03-26 LAB — BASIC METABOLIC PANEL
ANION GAP: 7 (ref 5–15)
BUN: 23 mg/dL (ref 8–23)
CALCIUM: 8.4 mg/dL — AB (ref 8.9–10.3)
CHLORIDE: 92 mmol/L — AB (ref 98–111)
CO2: 41 mmol/L — ABNORMAL HIGH (ref 22–32)
CREATININE: 0.98 mg/dL (ref 0.44–1.00)
GFR calc non Af Amer: 55 mL/min — ABNORMAL LOW (ref >60)
GLUCOSE: 136 mg/dL — AB (ref 70–99)
Potassium: 3.7 mmol/L (ref 3.5–5.1)
Sodium: 140 mmol/L (ref 135–145)

## 2018-03-26 LAB — MAGNESIUM: Magnesium: 1.9 mg/dL (ref 1.7–2.4)

## 2018-03-26 MED ORDER — LEVOFLOXACIN 750 MG PO TABS: 750.0000 mg | ORAL_TABLET | ORAL | Status: DC

## 2018-03-26 MED ORDER — PROMETHAZINE HCL 25 MG PO TABS: 12.5000 mg | ORAL_TABLET | Freq: Four times a day (QID) | ORAL | Status: DC | PRN

## 2018-03-26 MED ORDER — ONDANSETRON 4 MG PO TBDP: 4.0000 mg | ORAL_TABLET | Freq: Three times a day (TID) | ORAL | Status: DC | PRN

## 2018-03-26 MED ORDER — METHYLPREDNISOLONE SODIUM SUCC 40 MG IJ SOLR
40.0000 mg | Freq: Two times a day (BID) | INTRAMUSCULAR | Status: DC
  Administered 2018-03-26 – 2018-03-27 (×2): 40 mg via INTRAVENOUS
  Filled 2018-03-26 (×2): qty 1

## 2018-03-26 NOTE — Therapy (Addendum)
[facility] [address] [place], [place], [address] Phone: [phone]   Fax:  [phone]  Physical Therapy Evaluation  Patient Details  Name: Lynn Ramirez MRN: [id number] Date of Birth: 1940/08/10 Referring Provider: Annye Asa   Encounter Date: 03/21/2018  PT End of Session - 03/26/18 1336    Visit Number  1    Number of Visits  12    Date for PT Re-Evaluation  05/02/18    Authorization Type  Medicare    PT Start Time  0930    PT Stop Time  1012    PT Time Calculation (min)  42 min    Equipment Utilized During Treatment  Gait belt    Activity Tolerance  Patient tolerated treatment well;Patient limited by fatigue    Behavior During Therapy  [facility] for tasks assessed/performed       Past Medical History:  Diagnosis Date  . CKD (chronic kidney disease), stage II   . COPD (chronic obstructive pulmonary disease) ([place])   . Coronary artery calcification seen on CT scan   . Difficulty sleeping   . Eczema    hands  . Emphysema   . GERD (gastroesophageal reflux disease)   . H/O hydronephrosis   . History of transfusion    age 77  . Hyperlipidemia   . Hypertension   . Ovarian cancer ([place]) dx'd 09/2014   metastatic - prior malignant R pleural effusion and peritoneal carcinomatosis  . S/p nephrectomy     Past Surgical History:  Procedure Laterality Date  . ABDOMINAL HYSTERECTOMY    . CHOLECYSTECTOMY N/A 08/09/2015   Procedure: Attempted LAPAROSCOPIC coverted  open CHOLECYSTECTOMY WITH INTRAOPERATIVE CHOLANGIOGRAM;  Surgeon: Autumn Messing III, MD;  Location: [facility];  Service: General;  Laterality: N/A;  . HAND SURGERY Right 1980's  . LAPAROTOMY N/A 12/29/2014   Procedure:  LAPAROTOMY;  Surgeon: Everitt Amber, MD;  Location: [facility];  Service: Gynecology;  Laterality: N/A;  . NEPHRECTOMY    . ROBOTIC ASSISTED TOTAL HYSTERECTOMY WITH BILATERAL SALPINGO OOPHERECTOMY Bilateral 12/29/2014   Procedure: ROBOTIC ASSISTED TOTAL  LAPAROSCOPIC HYSTERECTOMY WITH BILATERAL SALPINGO OOPHORECTOMY AND OOMENTECTOMY WITH RADICAL TUMOR [place] ;  Surgeon: Everitt Amber, MD;  Location: [facility];  Service: Gynecology;  Laterality: Bilateral;  . THORACENTESIS     several  . TONSILLECTOMY    . TUBAL LIGATION      There were no vitals filed for this visit.   Subjective Assessment - 03/26/18 1334    Subjective  Pt states active Ovarian cancer for a few years. She has been on chemo (IV) ... 2wks on/ 1wk off. Usually is stable, but had to have fluid drained recently. Also has severe COPD.Marland Kitchen on O2, 2L , all the time.  Did have L knee pain, but doing well now.  Lives with husband, husband drives to appts now.   No stairs, 4 WW out, but not in house. Uses cane. 1 fall at home,  Does independ bathing, dressing.  She states hospitialization about 5 weeks ago for COPD.     Currently in Pain?  No/denies    Pain Score  0-No pain         [facility] PT Assessment - 03/26/18 0001      Assessment   Medical Diagnosis  Weakness/ Gait    Referring Provider  Annye Asa    Prior Therapy  no      Precautions   Precautions  Other (comment)   2 L  O2  Balance Screen   Has the patient fallen in the past 6 months  Yes    How many times?  1    Has the patient had a decrease in activity level because of a fear of falling?   No    Is the patient reluctant to leave their home because of a fear of falling?   No      Prior Function   Level of Independence  Independent with household mobility with device      Cognition   Overall Cognitive Status  Within Functional Limits for tasks assessed      AROM   Overall AROM Comments  UE: moderate limitations for elevation;  LE: WFL;       Strength   Overall Strength Comments  UE: 4-/5 gross; LE: 4/5 gross      Special Tests   Other special tests  O2 decreased to 81 after standing, walk, and TUG, increased to 97 after 1-2 min rest;       Balance   Balance Assessed  Yes      Standardized Balance  Assessment   Standardized Balance Assessment  Berg Balance Test;Timed Up and Go Test      Berg Balance Test   Sit to Stand  Able to stand  independently using hands    Standing Unsupported  Able to stand safely 2 minutes    Sitting with Back Unsupported but Feet Supported on Floor or Stool  Able to sit safely and securely 2 minutes    Stand to Sit  Controls descent by using hands    Transfers  Able to transfer safely, definite need of hands    Standing Unsupported with Eyes Closed  Able to stand 10 seconds with supervision    Standing Ubsupported with Feet Together  Able to place feet together independently and stand for 1 minute with supervision    From Standing, Reach Forward with Outstretched Arm  Can reach forward >12 cm safely (5")    From Standing Position, Pick up Object from Floor  Able to pick up shoe, needs supervision    From Standing Position, Turn to Look Behind Over each Shoulder  Turn sideways only but maintains balance    Turn 360 Degrees  Able to turn 360 degrees safely but slowly    Standing Unsupported, Alternately Place Feet on Step/Stool  Able to complete 4 steps without aid or supervision    Standing Unsupported, One Foot in Front  Needs help to step but can hold 15 seconds    Standing on One Leg  Tries to lift leg/unable to hold 3 seconds but remains standing independently    Total Score  37      Timed Up and Go Test   Normal TUG (seconds)  20    TUG Comments  used RW                Objective measurements completed on examination: See above findings.              PT Education - 03/26/18 1336    Education Details  PT POC,     Person(s) Educated  Patient    Methods  Explanation    Comprehension  Verbalized understanding       PT Short Term Goals - 03/26/18 1338      PT SHORT TERM GOAL #1   Title  Pt to demo 5 sit to stand without use of hands    Time  3  Period  Weeks    Status  New    Target Date  04/11/18        PT Long Term  Goals - 03/26/18 1342      PT LONG TERM GOAL #1   Title  Pt to demo improved TUG score, by at least 4 sec    Time  6    Period  Weeks    Status  New    Target Date  05/02/18      PT LONG TERM GOAL #2   Title  Pt to demo improved score of BERG by at least 3 points.     Time  6    Period  Weeks    Status  New    Target Date  05/02/18      PT LONG TERM GOAL #3   Title  Pt to demo improved strength of bil LEs to at least 4+/5 to improve stability gait.     Time  6    Period  Weeks    Status  New    Target Date  05/02/18      PT LONG TERM GOAL #4   Title  Pt to demo ability for ambulation with LRAD, for at least 300 ft, to improve endurane and safety with gait.     Time  6    Period  Weeks    Status  New    Target Date  05/02/18             Plan - 03/26/18 1605    Clinical Impression Statement  Pt presents with primary complaint of decreased mobility, walking, and endurance. She is medically complex, with significant PMH, is currently doing Chemo treatment and is on 2L O2 continuously. She does have signficant limitations for endurance, standing, walking, and mobility. She has mild balance deficits as well, as seen with BERG and dynamic balance. She has weakness in UE and LE and has lack of effective HEP. Pt to benefit from skilled PT to improve endurance, walking distance, and LE strength, to improve safety and independence at home.      Clinical Presentation  Stable    Clinical Decision Making  High    Rehab Potential  Fair    PT Frequency  2x / week    PT Duration  6 weeks    PT Treatment/Interventions  ADLs/Self Care Home Management;Cryotherapy;Moist Heat;Therapeutic activities;Functional mobility training;Stair training;Gait training;DME Instruction;Ultrasound;Therapeutic exercise;Balance training;Neuromuscular re-education;Patient/family education;Orthotic Fit/Training;Energy conservation;Passive range of motion;Manual techniques;Wheelchair mobility training     Consulted and Agree with Plan of Care  Patient       Patient will benefit from skilled therapeutic intervention in order to improve the following deficits and impairments:  Abnormal gait, Decreased endurance, Cardiopulmonary status limiting activity, Decreased activity tolerance, Decreased knowledge of use of DME, Decreased strength, Pain, Difficulty walking, Decreased mobility, Decreased balance, Decreased range of motion, Improper body mechanics, Decreased safety awareness, Decreased coordination  Visit Diagnosis: Other abnormalities of gait and mobility  Muscle weakness (generalized)     Problem List Patient Active Problem List   Diagnosis Date Noted  . Acute on chronic respiratory failure with hypoxia ([place]) 03/23/2018  . PNA (pneumonia) 03/23/2018  . Acute and chronic respiratory failure with hypoxia ([place]) 03/23/2018  . Normocytic anemia 02/22/2018  . Hypokalemia 02/22/2018  . On home oxygen therapy 02/22/2018  . Left leg pain 07/16/2017  . S/p nephrectomy   . History of transfusion   . H/O hydronephrosis   . GERD (  gastroesophageal reflux disease)   . Eczema   . Difficulty sleeping   . Coronary artery calcification seen on CT scan   . CKD (chronic kidney disease), stage II   . Chronic diastolic CHF (congestive heart failure) ([place]) 04/26/2017  . Cardiomyopathy ([place]) 04/26/2017  . Mixed incontinence urge and stress 01/31/2017  . Chronic cough 01/15/2017  . Patient on antineoplastic chemotherapy regimen 10/18/2016  . Pain and swelling of right lower extremity 10/02/2016  . Goals of care, counseling/discussion 09/26/2016  . Essential hypertension 08/07/2016  . B12 deficiency 08/07/2016  . Vitamin D deficiency 08/07/2016  . Diarrhea 07/06/2016  . Cramping of hands 07/06/2016  . Port catheter in place 11/02/2015  . Allergic rhinitis 11/02/2015  . Constipation   . Sigmoid stricture ([place])   . Obstipation 10/08/2015  . COPD GOLD III B   . Pulmonary nodules/lesions,  multiple 08/08/2015  . Chronic respiratory failure with hypoxia ([place]) 06/20/2015  . CAD (coronary artery disease) 03/31/2015  . Hyperlipidemia 03/31/2015  . Claudication ([place]) 03/31/2015  . Chemotherapy-induced neuropathy ([place]) 03/25/2015  . Genetic testing 02/19/2015  . Malignant neoplasm of fallopian tube ([place]) 02/19/2015  . Family history of pancreatic cancer 02/19/2015  . Family history of breast cancer in female 02/19/2015  . Ovarian cancer ([place]) 12/29/2014  . Malignant ascites 10/30/2014  . Primary peritoneal carcinomatosis ([place]) 10/23/2014  . Metastatic adenocarcinoma ([place]) 10/21/2014  . Insomnia 10/11/2014  . Pelvic mass in female 10/05/2014  . Malignant pleural effusion 09/29/2014  . Encopresis 06/18/2014    Lyndee Hensen, PT, DPT 4:15 PM  03/26/18    [facility] [place], [place], [address] Phone: [phone]   Fax:  [phone]  Name: Lynn Ramirez MRN: [id number] Date of Birth: 1940/12/25    PHYSICAL THERAPY DISCHARGE SUMMARY Visits since start of care: 1  Plan:                                                    Patient goals were not met. Patient is being discharged due to not returning since the last visit.  ?????     Pt did not return for follow up sessions, due to medical complications.   Lyndee Hensen, PT, DPT 12:26 PM  09/05/18

## 2018-03-26 NOTE — Progress Notes (Signed)
Patient ID: Lynn Ramirez, female   DOB: [birth year], 77 y.o.   MRN: [id number]   PROGRESS NOTE    Lynn Ramirez  NTI:[id number] DOB: Apr 30, 1941 DOA: 03/23/2018 PCP: Midge Minium, MD   Brief Narrative:  77 year old female with history of COPD, hypertension, ovarian cancer who presented on 03/23/2018 with worsening cough, wheezing and shortness of breath along with hypoxia.  Patient was recently seen in pulmonary clinic and was started on prednisone and Z-Pak but progressively got worse and was admitted for COPD exacerbation/pneumonia and started on antibiotics and IV steroids.   Assessment & Plan:   Principal Problem:   Acute on chronic respiratory failure with hypoxia (H[facility]) Active Problems:   Metastatic adenocarcinoma ([facility])   Primary peritoneal carcinomatosis ([facility])   Ovarian cancer ([place])   Chronic respiratory failure with hypoxia ([facility])   COPD GOLD III B   Essential hypertension   PNA (pneumonia)   Acute and chronic respiratory failure with hypoxia ([facility])  Acute on chronic hypoxic respiratory failure probably secondary to COPD exacerbation/pneumonia -Improving.  Currently still on 3 L oxygen via nasal cannula.  Patient wears oxygen via nasal cannula at 2 L/min at home.  Incentive spirometry -Echo showed EF of 55 to 60% with grade 1 diastolic dysfunction  COPD exacerbation -Feels  better.  Still complains of wheezing and dyspnea on exertion.  Decrease Solu-Medrol to 40 mg IV every 12 hours.  Continue duo nebs and Dulera. -Outpatient follow-up with pulmonary.   Probable left lobar bacterial pneumonia -CT chest showed findings suggestive of atypical infection versus inflammatory process but no PE. -Continue Levaquin for a total of 5 days.  Outpatient follow-up with pulmonary  Hypomagnesemia -Improved.  Ovarian cancer -Patient recently had a paracentesis and also had thoracentesis 2 months back -On gemcitabine per Dr. Learta Codding.  Oncology follow-up appreciated.  Outpatient  follow-up with oncology  Hypertension -Monitor blood pressure.  Blood pressure stable.  Continue Coreg and Lasix.  DVT prophylaxis: Lovenox Code Status: Full  family Communication: Spoke with patient and husband bedside Disposition Plan: Probable discharge tomorrow if continues to improve Consultants: None  Procedures: Echo on 03/24/2018 Study Conclusions  - Left ventricle: The cavity size was normal. Wall thickness was   increased in a pattern of mild LVH. Systolic function was normal.   The estimated ejection fraction was in the range of 55% to 60%.   Wall motion was normal; there were no regional wall motion   abnormalities. Doppler parameters are consistent with abnormal   left ventricular relaxation (grade 1 diastolic dysfunction). - Aortic valve: Mildly calcified annulus. Trileaflet. - Mitral valve: Mildly calcified annulus. There was mild   regurgitation. - Atrial septum: No defect or patent foramen ovale was identified.  Antimicrobials: Levaquin from 03/23/2018 onwards  Subjective: Patient seen and examined at bedside.  She feels slightly better but is still complains of wheezing and exertional shortness of breath.  Feels that she is not ready for discharge today yet.  No overnight fever, nausea or vomiting.  Objective: Vitals:   03/25/18 2227 03/26/18 0619 03/26/18 0651 03/26/18 0853  BP: 134/84 137/66    Pulse: 75 76    Resp: 17 16    Temp: 98.4 F (36.9 C) 98 F (36.7 C)    TempSrc: Oral Oral    SpO2: 96% 99% 92% 90%  Weight:      Height:        Intake/Output Summary (Last 24 hours) at 03/26/2018 1224 Last data filed at 03/26/2018 0900 Gross per 24 hour  Intake 402.56 ml  Output -  Net 402.56 ml   Filed Weights   03/23/18 2122 03/24/18 0400 03/25/18 0500  Weight: 77.4 kg 77.4 kg 75.7 kg    Examination:  General exam: No acute distress, calm and comfortable Respiratory system: Bilateral decreased breath sounds at bases with some expiratory wheezing  along with some scattered crackles  cardiovascular system: S1 & S2 heard, rate controlled gastrointestinal system: Abdomen is nondistended, soft and nontender. Normal bowel sounds heard. Extremities: No cyanosis; trace edema   Data Reviewed: I have personally reviewed following labs and imaging studies  CBC: Recent Labs  Lab 03/20/18 1037 03/23/18 1435 03/25/18 0330 03/25/18 0442 03/26/18 0608  WBC 3.5* 5.6 QUESTIONABLE RESULTS, RECOMMEND RECOLLECT TO VERIFY 7.6 10.4  NEUTROABS 1.9 4.7 QUESTIONABLE RESULTS, RECOMMEND RECOLLECT TO VERIFY 6.4 8.6  HGB 9.1* 9.2* QUESTIONABLE RESULTS, RECOMMEND RECOLLECT TO VERIFY 8.8* 9.1*  HCT 29.4* 30.3* QUESTIONABLE RESULTS, RECOMMEND RECOLLECT TO VERIFY 29.4* 29.8*  MCV 100.3 100.3* QUESTIONABLE RESULTS, RECOMMEND RECOLLECT TO VERIFY 101.0* 101.7*  PLT 313 305 QUESTIONABLE RESULTS, RECOMMEND RECOLLECT TO VERIFY 310 409   Basic Metabolic Panel: Recent Labs  Lab 03/20/18 1037 03/23/18 1435 03/25/18 0330 03/25/18 0545 03/26/18 0608  NA 142 141 QUESTIONABLE RESULTS, RECOMMEND RECOLLECT TO VERIFY 140 140  K 4.2 4.5 QUESTIONABLE RESULTS, RECOMMEND RECOLLECT TO VERIFY 3.9 3.7  CL 97* 96* QUESTIONABLE RESULTS, RECOMMEND RECOLLECT TO VERIFY 91* 92*  CO2 34* 35* QUESTIONABLE RESULTS, RECOMMEND RECOLLECT TO VERIFY 40* 41*  GLUCOSE 102* 137* QUESTIONABLE RESULTS, RECOMMEND RECOLLECT TO VERIFY 162* 136*  BUN 10 15 QUESTIONABLE RESULTS, RECOMMEND RECOLLECT TO VERIFY 17 23  CREATININE 0.89 1.01* QUESTIONABLE RESULTS, RECOMMEND RECOLLECT TO VERIFY 0.99 0.98  CALCIUM 9.2 9.1 QUESTIONABLE RESULTS, RECOMMEND RECOLLECT TO VERIFY 8.4* 8.4*  MG  --   --  QUESTIONABLE RESULTS, RECOMMEND RECOLLECT TO VERIFY 1.6* 1.9   GFR: Estimated Creatinine Clearance: 46.9 mL/min (by C-G formula based on SCr of 0.98 mg/dL). Liver Function Tests: Recent Labs  Lab 03/25/18 0330 03/25/18 0545  AST QUESTIONABLE RESULTS, RECOMMEND RECOLLECT TO VERIFY 25  ALT QUESTIONABLE  RESULTS, RECOMMEND RECOLLECT TO VERIFY 19  ALKPHOS QUESTIONABLE RESULTS, RECOMMEND RECOLLECT TO VERIFY 50  BILITOT QUESTIONABLE RESULTS, RECOMMEND RECOLLECT TO VERIFY 0.5  PROT QUESTIONABLE RESULTS, RECOMMEND RECOLLECT TO VERIFY 6.3*  ALBUMIN QUESTIONABLE RESULTS, RECOMMEND RECOLLECT TO VERIFY 2.7*   No results for input(s): LIPASE, AMYLASE in the last 168 hours. No results for input(s): AMMONIA in the last 168 hours. Coagulation Profile: No results for input(s): INR, PROTIME in the last 168 hours. Cardiac Enzymes: No results for input(s): CKTOTAL, CKMB, CKMBINDEX, TROPONINI in the last 168 hours. BNP (last 3 results) Recent Labs    12/17/17 1643  PROBNP 24.0   HbA1C: No results for input(s): HGBA1C in the last 72 hours. CBG: No results for input(s): GLUCAP in the last 168 hours. Lipid Profile: No results for input(s): CHOL, HDL, LDLCALC, TRIG, CHOLHDL, LDLDIRECT in the last 72 hours. Thyroid Function Tests: No results for input(s): TSH, T4TOTAL, FREET4, T3FREE, THYROIDAB in the last 72 hours. Anemia Panel: No results for input(s): VITAMINB12, FOLATE, FERRITIN, TIBC, IRON, RETICCTPCT in the last 72 hours. Sepsis Labs: No results for input(s): PROCALCITON, LATICACIDVEN in the last 168 hours.  Recent Results (from the past 240 hour(s))  Culture, body fluid-bottle     Status: None   Collection Time: 03/20/18  2:58 PM  Result Value Ref Range Status   Specimen Description PERITONEAL  Final   Special Requests NONE  Final   Culture   Final    NO GROWTH 5 DAYS Performed at [facility], [place] [address], [place], [place] [address]    Report Status 03/25/2018 FINAL  Final  Gram stain     Status: None   Collection Time: 03/20/18  2:58 PM  Result Value Ref Range Status   Specimen Description PERITONEAL  Final   Special Requests NONE  Final   Gram Stain   Final    NO WBC SEEN NO ORGANISMS SEEN Performed at [facility], [place] [address], [place], [place] [address]     Report Status 03/20/2018 FINAL  Final  MRSA PCR Screening     Status: None   Collection Time: 03/24/18 12:17 AM  Result Value Ref Range Status   MRSA by PCR NEGATIVE NEGATIVE Final    Comment:        The GeneXpert MRSA Assay (FDA approved for NASAL specimens only), is one component of a comprehensive MRSA colonization surveillance program. It is not intended to diagnose MRSA infection nor to guide or monitor treatment for MRSA infections. Performed at [facility], [place] [address] [place], [place] [address]          Radiology Studies: No results found.      Scheduled Meds: . atorvastatin  10 mg Oral q1800  . carvedilol  3.125 mg Oral BID WC  . Chlorhexidine Gluconate Cloth  6 each Topical Daily  . cholecalciferol  1,000 Units Oral Daily  . docusate sodium  100 mg Oral BID  . enoxaparin (LOVENOX) injection  40 mg Subcutaneous QHS  . furosemide  40 mg Oral Daily  . gabapentin  300 mg Oral QHS  . ipratropium-albuterol  3 mL Nebulization TID  . [START ON 03/27/2018] levofloxacin  750 mg Oral Q48H  . loratadine  10 mg Oral Daily  . mouth rinse  15 mL Mouth Rinse BID  . methylPREDNISolone (SOLU-MEDROL) injection  40 mg Intravenous Q8H  . mometasone-formoterol  2 puff Inhalation BID  . pantoprazole  40 mg Oral Daily  . pyridoxine  100 mg Oral Daily  . sodium chloride flush  10-40 mL Intracatheter Q12H  . vitamin B-12  100 mcg Oral Daily   Continuous Infusions: . sodium chloride Stopped (03/25/18 2147)  . albuterol Stopped (03/24/18 0645)     LOS: 3 days        Aline August, MD [facility] Pager [phone]  If 7PM-7AM, please contact night-coverage [email or web address] Password [facility] 03/26/2018, 12:24 PM

## 2018-03-26 NOTE — Care Management Important Message (Signed)
Important Message  Patient Details  Name: Lynn Ramirez MRN: [id number] Date of Birth: 1941/05/28   Medicare Important Message Given:  Yes    Kerin Salen 03/26/2018, 10:48 AMImportant Message  Patient Details  Name: Lynn Ramirez MRN: [id number] Date of Birth: [birth year]   Medicare Important Message Given:  Yes    Kerin Salen 03/26/2018, 10:48 AM

## 2018-03-26 NOTE — Telephone Encounter (Signed)
Attempted to call the pt back to endorse Dr Meda Coffee reviewed her echo and noted stable findings, and phone rang with no VM set-up.  Will continue to follow-up.

## 2018-03-26 NOTE — Progress Notes (Signed)
Pharmacy Antibiotic Note  Lynn Ramirez is a 77 y.o. female admitted on 03/23/2018 with pneumonia.  Pharmacy has been consulted for Levaquin dosing.  Today, 03/26/18  Day 3 Antibiotics  Afebrile  WBC WNL stable  SCr stable, CrCl 47  Plan:  Continue Levaquin 750mg  q48h but will change from IV form to PO form at same dose   Follow renal functionHeight: 5\' 1"  (154.9 cm) Weight: 166 lb 14.2 oz (75.7 kg) IBW/kg (Calculated) : 47.8  Temp (24hrs), Avg:98.2 F (36.8 C), Min:98 F (36.7 C), Max:98.4 F (36.9 C)  Recent Labs  Lab 03/20/18 1037 03/23/18 1435 03/25/18 0330 03/25/18 0442 03/25/18 0545 03/26/18 0608  WBC 3.5* 5.6 QUESTIONABLE RESULTS, RECOMMEND RECOLLECT TO VERIFY 7.6  --  10.4  CREATININE 0.89 1.01* QUESTIONABLE RESULTS, RECOMMEND RECOLLECT TO VERIFY  --  0.99 0.98    Estimated Creatinine Clearance: 46.9 mL/min (by C-G formula based on SCr of 0.98 mg/dL).    Allergies  Allergen Reactions  . Latex Rash    Latex gloves ONLY (pt cannot wear them- no reaction if someone else touches her with latex gloves on)  . Penicillins Other (See Comments)    welts Has patient had a PCN reaction causing immediate rash, facial/tongue/throat swelling, SOB or lightheadedness with hypotension: Yes  Has patient had a PCN reaction causing severe rash involving mucus membranes or skin necrosis:No Has patient had a PCN reaction that required hospitalization:No Has patient had a PCN reaction occurring within the last 10 years:No If all of the above answers are "NO", then may proceed with Cephalosporin use.     Antimicrobials this admission: 9/14 levaquin >>     Thank you for allowing pharmacy to be a part of this patient's care.  Adrian Saran, PharmD, BCPS Pager [phone] 03/26/2018 8:45 AM

## 2018-03-26 NOTE — Telephone Encounter (Signed)
Yes and with stable findings

## 2018-03-26 NOTE — Telephone Encounter (Signed)
New message:       Pt states she is the hosp and they did an echo on her in there. Pt states is was a special way the echo was supposed to be done due to her being on chemo.

## 2018-03-26 NOTE — Telephone Encounter (Signed)
Pt is calling to let Dr Meda Coffee know that she has been admitted to the hospital for pneumonia, and they did an echo on her.  Pt states that the MD who did the echo on her said everything looked well, but she wanted to let Dr Meda Coffee review this as well, since she follows her echo's closely, due to the pt being on chemo.  Pt states she is doing better, and they anticipate that she will go home tomorrow.  Gave the pt our best wishes and informed her that I will make Dr Meda Coffee aware of her current admission and to review her inpatient echo.  Pt verbalized understanding and agrees with this plan.

## 2018-03-27 ENCOUNTER — Other Ambulatory Visit (HOSPITAL_COMMUNITY): Payer: Medicare Other

## 2018-03-27 ENCOUNTER — Other Ambulatory Visit: Payer: Self-pay | Admitting: Oncology

## 2018-03-27 DIAGNOSIS — J9611 Chronic respiratory failure with hypoxia: Secondary | ICD-10-CM

## 2018-03-27 DIAGNOSIS — J189 Pneumonia, unspecified organism: Secondary | ICD-10-CM

## 2018-03-27 DIAGNOSIS — C569 Malignant neoplasm of unspecified ovary: Secondary | ICD-10-CM

## 2018-03-27 DIAGNOSIS — J449 Chronic obstructive pulmonary disease, unspecified: Secondary | ICD-10-CM

## 2018-03-27 DIAGNOSIS — I10 Essential (primary) hypertension: Secondary | ICD-10-CM

## 2018-03-27 LAB — CBC WITH DIFFERENTIAL/PLATELET
BASOS PCT: 0 %
Basophils Absolute: 0 10*3/uL (ref 0.0–0.1)
EOS ABS: 0 10*3/uL (ref 0.0–0.7)
EOS PCT: 0 %
HCT: 30.8 % — ABNORMAL LOW (ref 36.0–46.0)
HEMOGLOBIN: 9.4 g/dL — AB (ref 12.0–15.0)
Lymphocytes Relative: 11 %
Lymphs Abs: 1.3 10*3/uL (ref 0.7–4.0)
MCH: 31.2 pg (ref 26.0–34.0)
MCHC: 30.5 g/dL (ref 30.0–36.0)
MCV: 102.3 fL — ABNORMAL HIGH (ref 78.0–100.0)
MONOS PCT: 9 %
Monocytes Absolute: 1 10*3/uL (ref 0.1–1.0)
Neutro Abs: 9 10*3/uL — ABNORMAL HIGH (ref 1.7–7.7)
Neutrophils Relative %: 80 %
PLATELETS: 363 10*3/uL (ref 150–400)
RBC: 3.01 MIL/uL — ABNORMAL LOW (ref 3.87–5.11)
RDW: 19.1 % — ABNORMAL HIGH (ref 11.5–15.5)
WBC: 11.3 10*3/uL — AB (ref 4.0–10.5)

## 2018-03-27 LAB — BASIC METABOLIC PANEL
Anion gap: 8 (ref 5–15)
BUN: 24 mg/dL — AB (ref 8–23)
CHLORIDE: 91 mmol/L — AB (ref 98–111)
CO2: 43 mmol/L — ABNORMAL HIGH (ref 22–32)
CREATININE: 0.9 mg/dL (ref 0.44–1.00)
Calcium: 8.8 mg/dL — ABNORMAL LOW (ref 8.9–10.3)
GFR calc Af Amer: >60 mL/min (ref >60)
Glucose, Bld: 178 mg/dL — ABNORMAL HIGH (ref 70–99)
Potassium: 3.8 mmol/L (ref 3.5–5.1)
SODIUM: 142 mmol/L (ref 135–145)

## 2018-03-27 LAB — MAGNESIUM: MAGNESIUM: 1.8 mg/dL (ref 1.7–2.4)

## 2018-03-27 MED ORDER — PREDNISONE 10 MG PO TABS: ORAL_TABLET | ORAL | 0 refills | Status: DC

## 2018-03-27 MED ORDER — LEVOFLOXACIN 750 MG PO TABS
750.0000 mg | ORAL_TABLET | Freq: Every day | ORAL | Status: DC
  Administered 2018-03-27: 750 mg via ORAL
  Filled 2018-03-27: qty 1

## 2018-03-27 MED ORDER — GUAIFENESIN 100 MG/5ML PO SOLN: 5.0000 mL | ORAL | 0 refills | Status: DC | PRN

## 2018-03-27 MED ORDER — HEPARIN SOD (PORK) LOCK FLUSH 100 UNIT/ML IV SOLN
500.0000 [IU] | Freq: Once | INTRAVENOUS | Status: AC
  Administered 2018-03-27: 500 [IU] via INTRAVENOUS
  Filled 2018-03-27: qty 5

## 2018-03-27 MED ORDER — LEVOFLOXACIN 750 MG PO TABS: 750.0000 mg | ORAL_TABLET | Freq: Every day | ORAL | 0 refills | Status: DC

## 2018-03-27 NOTE — Progress Notes (Signed)
IP PROGRESS NOTE  Subjective:   Lynn Ramirez reports feeling better.  She is ambulating in the room.  The abdominal distention has not returned.  Objective: Vital signs in last 24 hours: Blood pressure 114/67, pulse 90, temperature 97.8 F (36.6 C), temperature source Oral, resp. rate 18, height '5\' 1"'  (1.549 m), weight 166 lb 14.2 oz (75.7 kg), SpO2 91 %.  Intake/Output from previous day: 09/17 0701 - 09/18 0700 In: 370 [P.O.:360; I.V.:10] Out: -   Physical Exam:  HEENT: Thrush Lungs: Bilateral expiratory wheeze, no respiratory distress Cardiac: Distant heart sounds, regular rate and rhythm Abdomen: Soft and nontender Extremities: No leg edema  Portacath/PICC-without erythema  Lab Results: Recent Labs    03/26/18 0608 03/27/18 0607  WBC 10.4 11.3*  HGB 9.1* 9.4*  HCT 29.8* 30.8*  PLT 345 363    BMET Recent Labs    03/26/18 0608 03/27/18 0607  NA 140 142  K 3.7 3.8  CL 92* 91*  CO2 41* 43*  GLUCOSE 136* 178*  BUN 23 24*  CREATININE 0.98 0.90  CALCIUM 8.4* 8.8*     Medications: I have reviewed the patient's current medications.  Assessment/Plan:  1. 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, immunohistochemical profile consistent with a GYN primary, elevated CA 125   Staging CTs of the chest, abdomen, and pelvis on 10/06/2014 revealed a loculated right pleural effusion, ascites, and omental/mesenteric thickening   Cytology from peritoneal fluid 10/13/2014 revealed malignant cells consistent with metastatic adenocarcinoma Biopsy of an omental mass on 11/02/2014 revealed invasive serous carcinoma with psammoma bodies Cycle 1 Taxol/carboplatin 10/28/2014 Cycle 2 Taxol/carboplatin 11/18/2014 Cycle 3 Taxol/carboplatin 12/09/2014  CT scan 12/23/2014 with interval improvement in  peritoneal carcinomatosis with near-complete resolution of ascites and decreased omental nodularity. Significant improvement in malignant right pleural effusion.  Status post robotic-assisted laparoscopic hysterectomy with bilateral salpingoophorectomy, omentectomy, radical tumor debulking 12/29/2014. Per Dr. Serita Grit office note 01/14/2015 cytoreduction was optimal with residual disease remaining only in the 1 mm implants on the small intestine. 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  MSI-stable, mutation burden-4, BRAF rearrangement,ERBB4, KRAS G12D Cycle 1 adjuvant Taxol/carboplatin 01/20/2015 Cycle 2 adjuvant Taxol/carboplatin 02/10/2015 Cycle 3 adjuvant Taxol/carboplatin 03/10/2015 CA 125 on 1013 2016-42  CTs of the chest, abdomen, and pelvis 05/31/2015 with no evidence of progressive ovarian cancer  CTs of the chest, abdomen, and pelvis 08/07/2015 and 08/08/2015-no evidence of progressive ovarian cancer  Peritoneal studding noted at the time of the cholecystectomy procedure 08/09/2015 Elevated CA 125 10/07/2015  CT 10/07/2015 with stricturing at the sigmoid colon, constipation, and omental nodularity  Initiation of salvage weekly Taxol chemotherapy 10/13/2015  Taxol changed to every 2 weeks beginning 01/19/2016 due to painful neuropathy.  CT scans 07/19/2016 with no acute findings. No features in the abdomen or pelvis to suggest recurrent disease.Stable mild fullness right intrarenal collecting system.  Elevated CA 125 treatment resumed with Taxol/Avastin on a 2 week schedule 09/27/2016  CT 02/12/2017-no evidence of carcinomatosis, no evidence of progressive metastatic disease  Taxol/Avastin  continued every 2 weeks  CT 09/10/2017-increase in trace ascites, no evidence of progressive carcinomatosis, inflammatory changes at the lung bases with a new 3 mm right lower lobe nodule  Taxol/Avastin continued every 2 weeks  Progressive rise in the CA 125  Cycle 1 carboplatin 3-week schedule4/17/2019  Cycle 2 carboplatin 11/14/2017  Progressive malignant left pleural effusion June 2019  CT 01/07/2018-resolution of pulmonary nodule seen on prior examination. New small bilateral pleural effusions left greater than right with areas of atelectasis. Stable scattered mediastinal and hilar lymph nodes some partially calcified. Stable emphysematous changes and areas of pulmonary scarring. Small volume abdominal ascites but no omental or peritoneal surface disease. Small scattered mesenteric and retroperitoneal lymph nodes stable. Stable nodularity left adrenal gland.  Cycle 1 gemcitabine 01/09/2018 (day 1/day 8 schedule)  Cycle 2 gemcitabine 01/30/2018  Cycle 3 gemcitabine 02/21/2018  CT 03/03/2018- moderate free fluid in the abdomen and pelvis, thickened walls of small bowel loops in the right lower quadrant  Cycle 4 gemcitabine 03/13/2018  CT 03/03/2018-small bilateral pleural effusions, moderate ascites, small bowel wall thickening in the right lower quadrant  Paracentesis 03/20/2018- negative cytology 2. COPD-followed by Dr. Lamonte Sakai. 3. Dyspnea secondary to COPD and the large right pleural effusion, status post therapeutic thoracentesis procedures 09/30/2014,10/09/2014, and 10/21/2014. Left thoracentesis 10/30/2014  Progressive left pleural effusion noted on chest x-ray 12/17/2017  Left thoracentesis 12/19/2017, cytology positive for metastatic adenocarcinoma 4. Left nephrectomy as a child 5. Delayed nausea following cycle 1 Taxol/carboplatin, Aloxi/Emend added with cycle 2 with improvement. 6. Right lower extremity edema, right calf pain 12/09/2014. Negative venous Doppler  12/09/2014. 7. Diffuse pruritus following cycle 1 adjuvant Taxol/carboplatin 01/20/2015, no rash, resolved with a steroid dose pack 8. Thrombocytopenia second to chemotherapy, the carboplatin was dose reduced with cycle 2 adjuvant Taxol/carboplatin 02/10/2015 9. Chemotherapy-induced peripheral neuropathy-painful peripheral neuropathy involving the feet 01/19/2016.  10. Admission with acute cholecystitis 08/07/2015, status post a cholecystectomy 08/09/2015 11. Admission 10/08/2015 with abdominal pain/constipation, improved with bowel rest and laxatives 12. Mild right hydronephrosis noted on the CT 10/07/2015. Renal ultrasound 12/16/2015 with no hydronephrosis noted.No hydronephrosis on CT 07/19/2016. 13. Anemia secondary to chronic disease and chemotherapy, status post a red cell transfusion 12/05/2017 14. Admission 02/22/2018 with increased dyspnea 15. Admission 03/23/2018 with a COPD flare  Lynn Ramirez appears to be improving from a respiratory standpoint.  I suspect the dyspnea/hypoxia is related to a flare of COPD.  I have a low clinical suspicion for progression of eloping tube cancer in the chest or drug-induced pneumonitis.  I discussed the case with Dr. Denman George.  She will consider salvage treatment options for the fallopian tube cancer if there is clear progression on gemcitabine.  The plan is to continue gemcitabine chemotherapy as an outpatient beginning 04/03/2018. Recommendations: 1.  Management of respiratory failure steroid taper per medicine/pulmonary 2.  Outpatient follow-up as scheduled at the Cancer center 04/03/2018.  Please call Oncology as needed.    LOS: 4 days   Betsy Coder, MD   03/27/2018, 11:10 AM

## 2018-03-27 NOTE — Care Management (Signed)
Per pt, she was active with outpatient physical therapy prior to admission. Pt to follow up with outpatient physical therapy when discharged. Marney Doctor RN,BSN [phone]

## 2018-03-27 NOTE — Progress Notes (Signed)
Pharmacy Antibiotic Note  Lynn Ramirez is a 77 y.o. female admitted on 03/23/2018 with pneumonia.  Pharmacy has been consulted for Levaquin dosing.  Today, 03/27/18  Day 4 Antibiotics  Afebrile  WBC WNL stable  SCr improved CrCl 51  Plan:  Change Levaquin from 750mg  q48hr to q24hr for renal function improvement to complete 5 days total per Md notes   Follow renal functionHeight: 5\' 1"  (154.9 cm) Weight: 166 lb 14.2 oz (75.7 kg) IBW/kg (Calculated) : 47.8  Temp (24hrs), Avg:97.8 F (36.6 C), Min:97.5 F (36.4 C), Max:98 F (36.7 C)  Recent Labs  Lab 03/23/18 1435 03/25/18 0330 03/25/18 0442 03/25/18 0545 03/26/18 0608 03/27/18 0607  WBC 5.6 QUESTIONABLE RESULTS, RECOMMEND RECOLLECT TO VERIFY 7.6  --  10.4 11.3*  CREATININE 1.01* QUESTIONABLE RESULTS, RECOMMEND RECOLLECT TO VERIFY  --  0.99 0.98 0.90    Estimated Creatinine Clearance: 51.1 mL/min (by C-G formula based on SCr of 0.9 mg/dL).    Allergies  Allergen Reactions  . Latex Rash    Latex gloves ONLY (pt cannot wear them- no reaction if someone else touches her with latex gloves on)  . Penicillins Other (See Comments)    welts Has patient had a PCN reaction causing immediate rash, facial/tongue/throat swelling, SOB or lightheadedness with hypotension: Yes  Has patient had a PCN reaction causing severe rash involving mucus membranes or skin necrosis:No Has patient had a PCN reaction that required hospitalization:No Has patient had a PCN reaction occurring within the last 10 years:No If all of the above answers are "NO", then may proceed with Cephalosporin use.     Antimicrobials this admission: 9/14 levaquin >>     Thank you for allowing pharmacy to be a part of this patient's care.  Adrian Saran, PharmD, BCPS Pager [phone] 03/27/2018 8:43 AM

## 2018-03-27 NOTE — Progress Notes (Signed)
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     PT Comments    Patient eager to go home. Saturation on 3 liters dropped to 87% and recovered quickly. HR 112.    Follow Up Recommendations  Outpatient PT     Equipment Recommendations  None recommended by PT    Recommendations for Other Services       Precautions / Restrictions Precautions Precautions: Fall Precaution Comments: minitor sats on O2    Mobility  Bed Mobility Overal bed mobility: Independent                Transfers Overall transfer level: Needs assistance Equipment used: Rolling walker (2 wheeled) Transfers: Sit to/from Stand Sit to Stand: Supervision            Ambulation/Gait Ambulation/Gait assistance: Min guard Gait Distance (Feet): 90 Feet Assistive device: Rolling walker (2 wheeled) Gait Pattern/deviations: Step-through pattern     General Gait Details: gait smoothe, encouraged pursed lip breaths   Stairs             Wheelchair Mobility    Modified Rankin (Stroke Patients Only)       Balance                                            Cognition Arousal/Alertness: Awake/alert                                            Exercises      General Comments        Pertinent Vitals/Pain Pain Assessment: No/denies pain    Home Living                      Prior Function            PT Goals (current goals can now be found in the care plan section)  Progress towards PT goals: Progressing toward goals    Frequency    Min 3X/week      PT Plan Current plan remains appropriate    Co-evaluation              AM-PAC PT "6 Clicks" Daily Activity  Outcome Measure  Difficulty turning over in bed (including adjusting bedclothes, sheets and blankets)?: None Difficulty moving from lying on back to sitting on the side of the bed? : None Difficulty sitting down on and standing up from a chair with arms (e.g., wheelchair, bedside commode, etc,.)?: A Little Help needed moving to and from a bed to chair (including a wheelchair)?: A Little   Help needed climbing 3-5 steps with a railing? : Total 6 Click Score: 15    End of Session   Activity Tolerance: Patient tolerated treatment well Patient left: in chair;with call bell/phone within reach;with family/visitor present Nurse Communication: Mobility status       Time: 1045-1105 PT Time Calculation (min) (ACUTE  ONLY): 20 min  Charges:  $Gait Training: 8-22 mins                     [place] Pager [phone] Office [phone]    Lynn Ramirez 03/27/2018, 1:42 PM

## 2018-03-27 NOTE — Discharge Instructions (Signed)

## 2018-03-27 NOTE — Discharge Summary (Signed)
Physician Discharge Summary  Lynn Ramirez ZYS:[id number] DOB: 1940-09-16 DOA: 03/23/2018  PCP: Lynn Minium, MD  Admit date: 03/23/2018 Discharge date: 03/27/2018  Time spent: 45 minutes  Recommendations for Outpatient Follow-up:  Patient will be discharged to home with outpatient physical therapy.  Patient will need to follow up with primary care provider within one week of discharge, repeat magnesium.  Patient should continue medications as prescribed.  Patient should follow a heart healthy diet.   Discharge Diagnoses:  Acute on chronic hypoxic respiratory failure secondary to COPD exacerbation and pneumonia COPD exacerbation Probable left lobar bacterial pneumonia Hypomagnesemia Essential hypertension Ovarian cancer  Discharge Condition: Stable  Diet recommendation: Heart healthy  Filed Weights   03/23/18 2122 03/24/18 0400 03/25/18 0500  Weight: 77.4 kg 77.4 kg 75.7 kg    History of present illness:  On 03/23/2018 by Lynn. Roney Jaffe Genevie Ramirez is a 77 y.o. female with hx of COPD, HTN, ovarian Cancer who presented to ED today c/o SOB worse than usual, unable to walk across the room.  She was seen in ED and rec'd prolonged neb Rx's w/ some improvement, but was still unable to ambulate w/o dropping O2sat's into the 80's.  We are asked to see for admission.   Patient states she was fine until 2 nights ago she awoke having trouble breathing. Then last night she had the same problems. Yesterday went to see her pulm MD Lynn Ramirez in clinic and they started po Zpak and prednisone. She continue to worsen and today came to ED.  Pt also notes she had flu shot last week.  Also pt has hx of ovarian Ca f/b Lynn Ramirez and was dx'd 3.5 yrs ago, she states the cancer has been "kept in check" w/ chemo over the last 3 yrs. However 44mos ago she required removal of fluid from around her left lung, and this week on Wed 3 d ago she required a paracentesis, both of these procedures she  states she hasn't had since her original cancer diagnosis in 2016.  She was started back on carboplatin in April 2019 when disease progression was noted w/ Lynn Ramirez, then in July 2019 was started on gemcitabine and continues to get this now every 3 weeks. 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    Patient denies any recent fevers, chills, sweats, sore throat, ear pain, swollen glands in neck. No prod cough. Has nonprod cough.  Quit smoking 10yrs ago when dx'd w/ COPD.  Is on Home O2 at 2L.    Recent admit Aug 2019 x 24 hrs for mild COPD exacerbation  Hospital Course:  Acute on chronic hypoxic respiratory failure secondary to COPD exacerbation and pneumonia -Improving.  Patient currently still on 3 L of oxygen via nasal cannula.  She normally wears 2 L at home. -Continue incentive spirometer -Continue to treat underlying causes -Echocardiogram obtained showing an EF of 55 to 93%, grade 1 diastolic dysfunction  COPD exacerbation -Improved, patient feels her wheezing is not as loud as previous days and she feels less shortness of breath with exertion. -Continue steroids, nebulizer treatments and Arnuity Ellipta, Proair -Follow-up with pulmonology  Probable left lobar bacterial pneumonia -CT chest showed findings suggestive of atypical infection versus inflammatory process, negative for PE -Continue Levaquin for total of 5 days -Follow-up with outpatient pulmonologist  Hypomagnesemia -Resolved with replacement, would repeat in 1 week  Essential hypertension -Stable, continue Coreg and Lasix  Ovarian cancer -Recently had paracentesis -Oncology  consulted and appreciated, continue to follow-up with Lynn. Benay Ramirez  Deconditioning -PT recommended outpatient physical therapy which patient was having set up prior to admission  Procedures: Echocardiogram  Consultations: Oncology/hematology, Lynn. Benay Ramirez  Discharge Exam: Vitals:   03/27/18  0621 03/27/18 0904  BP:    Pulse:    Resp:    Temp:    SpO2: 99% 91%   She is feeling much better this morning.  Would like to go home.  Feels her breathing and cough have improved.  Does complain of nasal congestion.  Denies current chest pain, abdominal pain, nausea or vomiting, diarrhea or constipation, dizziness or headache.   General: Well developed, well nourished, NAD, appears stated age  HEENT: NCAT, mucous membranes moist.  Neck: Supple  Cardiovascular: S1 S2 auscultated, RRR  Respiratory: Diminished breath sounds, scattered wheezing  Abdomen: Soft, nontender, nondistended, + bowel sounds  Extremities: warm dry without cyanosis clubbing. Trace LE edema  Neuro: AAOx3, nonfocal  Psych: Normal affect and demeanor  Discharge Instructions Discharge Instructions    Discharge instructions   Complete by:  As directed    Patient will be discharged to home.  Patient will need to follow up with primary care provider within one week of discharge, repeat magnesium.  Patient should continue medications as prescribed.  Patient should follow a heart healthy diet.     Allergies as of 03/27/2018      Reactions   Latex Rash   Latex gloves ONLY (pt cannot wear them- no reaction if someone else touches her with latex gloves on)   Penicillins Other (See Comments)   welts Has patient had a PCN reaction causing immediate rash, facial/tongue/throat swelling, SOB or lightheadedness with hypotension: Yes  Has patient had a PCN reaction causing severe rash involving mucus membranes or skin necrosis:No Has patient had a PCN reaction that required hospitalization:No Has patient had a PCN reaction occurring within the last 10 years:No If all of the above answers are "NO", then may proceed with Cephalosporin use.      Medication List    TAKE these medications   acetaminophen 650 MG CR tablet Commonly known as:  TYLENOL Take 650 mg by mouth every 8 (eight) hours as needed for pain.     antiseptic oral rinse Liqd 15 mLs by Mouth Rinse route 3 (three) times daily.   ARNUITY ELLIPTA 200 MCG/ACT Aepb Generic drug:  Fluticasone Furoate INHALE 1 PUFF INTO THE LUNGS DAILY   atorvastatin 10 MG tablet Commonly known as:  LIPITOR TAKE 1 TABLET(10 MG) BY MOUTH DAILY What changed:  See the new instructions.   Biotin 2500 MCG Caps Take 1 tablet by mouth daily.   carvedilol 3.125 MG tablet Commonly known as:  COREG TAKE 1 TABLET(3.125 MG) BY MOUTH TWICE DAILY WITH A MEAL What changed:  See the new instructions.   cholecalciferol 1000 units tablet Commonly known as:  VITAMIN D Take 1,000 Units by mouth daily.   diphenoxylate-atropine 2.5-0.025 MG tablet Commonly known as:  LOMOTIL TAKE 2 TABLETS BY MOUTH FOUR TIMES DAILY AS NEEDED FOR DIARRHEA/ LOOSE STOOLS   esomeprazole 40 MG capsule Commonly known as:  NEXIUM Take 40 mg by mouth daily at 12 noon.   fluocinonide cream 0.05 % Commonly known as:  LIDEX Apply 1 application topically 2 (two) times daily as needed (For ezcema).   furosemide 40 MG tablet Commonly known as:  LASIX Take 1 tablet (40 mg total) by mouth daily.   gabapentin 300 MG capsule Commonly known as:  NEURONTIN TAKE 1 CAPSULE BY MOUTH EVERY NIGHT AT BEDTIME   guaiFENesin 100 MG/5ML Soln Commonly known as:  ROBITUSSIN Take 5 mLs (100 mg total) by mouth every 4 (four) hours as needed for cough or to loosen phlegm.   hydrOXYzine 25 MG capsule Commonly known as:  VISTARIL Take 1 capsule (25 mg total) by mouth 4 (four) times daily as needed for itching.   levofloxacin 750 MG tablet Commonly known as:  LEVAQUIN Take 1 tablet (750 mg total) by mouth daily. Start taking on:  03/28/2018   lidocaine-prilocaine cream Commonly known as:  EMLA Apply to port site one hour prior to use. Do not rub in. Cover with plastic.   loratadine 10 MG tablet Commonly known as:  CLARITIN Take 10 mg by mouth daily.   ondansetron 4 MG disintegrating  tablet Commonly known as:  ZOFRAN-ODT Take 1 tablet (4 mg total) by mouth every 8 (eight) hours as needed for nausea or vomiting.   oxyCODONE-acetaminophen 5-325 MG tablet Commonly known as:  PERCOCET/ROXICET Take 1 tablet by mouth every 6 (six) hours as needed for severe pain.   Potassium Chloride ER 20 MEQ Tbcr TAKE 1/2 TABLET BY MOUTH DAILY   predniSONE 10 MG tablet Commonly known as:  DELTASONE Take 2 tablets (20mg  total) daily for the next 5 days. Take in the AM with food.   PROAIR HFA 108 (90 Base) MCG/ACT inhaler Generic drug:  albuterol INHALE 2 PUFFS INTO THE LUNGS FOUR TIMES DAILY AS NEEDED FOR WHEEZING What changed:  See the new instructions.   albuterol (2.5 MG/3ML) 0.083% nebulizer solution Commonly known as:  PROVENTIL INHALE THE CONTENTS OF 1 VIAL VIA NEBULIZER EVERY 6 HOURS AS NEEDED FOR WHEEZING OR SHORTNESS OF BREATH What changed:  See the new instructions.   pyridoxine 100 MG tablet Commonly known as:  B-6 Take 100 mg by mouth daily.   STIOLTO RESPIMAT 2.5-2.5 MCG/ACT Aers Generic drug:  Tiotropium Bromide-Olodaterol INHALE 2 PUFFS INTO THE LUNGS DAILY What changed:  See the new instructions.   traMADol 50 MG tablet Commonly known as:  ULTRAM Take 1 tablet (50 mg total) by mouth every 12 (twelve) hours as needed. for pain   vitamin B-12 100 MCG tablet Commonly known as:  CYANOCOBALAMIN Take 100 mcg by mouth daily. Reported on 01/19/2016   zolpidem 5 MG tablet Commonly known as:  AMBIEN Take 1 tablet (5 mg total) by mouth at bedtime as needed for sleep.      Allergies  Allergen Reactions  . Latex Rash    Latex gloves ONLY (pt cannot wear them- no reaction if someone else touches her with latex gloves on)  . Penicillins Other (See Comments)    welts Has patient had a PCN reaction causing immediate rash, facial/tongue/throat swelling, SOB or lightheadedness with hypotension: Yes  Has patient had a PCN reaction causing severe rash involving mucus  membranes or skin necrosis:No Has patient had a PCN reaction that required hospitalization:No Has patient had a PCN reaction occurring within the last 10 years:No If all of the above answers are "NO", then may proceed with Cephalosporin use.    Follow-up Information    Lauraine Rinne, NP Follow up on 04/01/2018.   Specialty:  Pulmonary Disease Why:  Appt at 11:00 AM.  Please arrive at 10:45 for check in.  Contact information: [address] [place] [address] [phone]        Lynn Minium, MD. Schedule an appointment as soon as  possible for a visit in 1 week(s).   Specialty:  Family Medicine Why:  Hospital follow up Contact information: [address] [place] [address] [phone]        Dorothy Spark, MD .   Specialty:  Cardiology Contact information: [address] [place] [address] [phone]            The results of significant diagnostics from this hospitalization (including imaging, microbiology, ancillary and laboratory) are listed below for reference.    Significant Diagnostic Studies: Dg Chest 2 View  Result Date: 03/23/2018 CLINICAL DATA:  Patient with shortness of breath EXAM: CHEST - 2 VIEW COMPARISON:  Chest radiograph 03/20/2018 FINDINGS: Right anterior chest wall Port-A-Cath is present tip projecting over the superior vena cava. Monitoring leads overlie the patient. Stable cardiomegaly. Aortic atherosclerosis. Bibasilar heterogeneous pulmonary opacities. No pleural effusion or pneumothorax. Thoracic spine degenerative changes posttraumatic deformity proximal right humerus. IMPRESSION: Bibasilar opacities favored to represent atelectasis. Infection not excluded. Electronically Signed   By: Lovey Newcomer M.D.   On: 03/23/2018 16:18   Dg Chest 2 View  Result Date: 03/21/2018 CLINICAL DATA:  Follow-up pleural effusion.  Shortness of breath. EXAM: CHEST - 2 VIEW COMPARISON:  02/22/2018 FINDINGS: Right  Port-A-Cath remains in place, unchanged. There is hyperinflation of the lungs compatible with COPD. Lingular scarring. No confluent airspace opacities. No visible significant effusions. No acute bony abnormality. IMPRESSION: COPD.  No active disease. Electronically Signed   By: Rolm Baptise M.D.   On: 03/21/2018 08:05   Ct Angio Chest Pe W And/or Wo Contrast  Result Date: 03/23/2018 CLINICAL DATA:  Shortness of breath with increased cough and wheezing. EXAM: CT ANGIOGRAPHY CHEST WITH CONTRAST TECHNIQUE: Multidetector CT imaging of the chest was performed using the standard protocol during bolus administration of intravenous contrast. Multiplanar CT image reconstructions and MIPs were obtained to evaluate the vascular anatomy. CONTRAST:  177mL ISOVUE-370 IOPAMIDOL (ISOVUE-370) INJECTION 76% COMPARISON:  02/23/2018, 10/16/2017 and 08/08/2015 and abdominal CT 03/03/2018 FINDINGS: Cardiovascular: Heart is normal size. There is calcified plaque over the thoracic aorta which is otherwise normal in caliber. Pulmonary arterial system is within normal without emboli. Mediastinum/Nodes: No mediastinal or hilar adenopathy. Remaining mediastinal structures are normal. Lungs/Pleura: Lungs are adequately inflated demonstrate mild centrilobular emphysematous disease. There is resolved bibasilar atelectasis. Stable small amount right pleural fluid. Subtle focal reticulonodular opacification over the lingula and sub solid nodular opacification over the superior segment left lower lobe. Airways are within normal. Upper Abdomen: Free fluid over the left upper quadrant unchanged. Calcified plaque over the abdominal aorta. Remaining visualized upper abdomen is unchanged. Musculoskeletal: Degenerate change of the spine. Old right humeral fracture. Review of the MIP images confirms the above findings. IMPRESSION: No evidence of pulmonary embolism. Subtle reticulonodular density over the lingula and sub solid subcentimeter nodule  opacification over the superior segment left lower lobe. Small stable right pleural effusion. Findings may be due to atypical infectious or inflammatory process. Recommend follow-up CT 6 weeks. Aortic Atherosclerosis (ICD10-I70.0) and Emphysema (ICD10-J43.9). Free fluid over the left upper quadrant unchanged. Electronically Signed   By: Marin Olp M.D.   On: 03/23/2018 16:39   Ct Abdomen Pelvis W Contrast  Result Date: 03/03/2018 CLINICAL DATA:  Generalized abdominal pain with vomiting since yesterday. History of ovarian cancer. EXAM: CT ABDOMEN AND PELVIS WITH CONTRAST TECHNIQUE: Multidetector CT imaging of the abdomen and pelvis was performed using the standard protocol following bolus administration of intravenous contrast. CONTRAST:  122mL ISOVUE-300 IOPAMIDOL (ISOVUE-300) INJECTION 61% COMPARISON:  CT abdomen dated 01/04/2018. CT abdomen dated 09/10/2017. FINDINGS: Lower chest: Small bilateral pleural effusions. LEFT pleural effusion is decreased compared to the most recent abdomen CT of 01/04/2018, similar to interval chest CT of 02/23/2018. Hepatobiliary: Perihepatic ascites. Scalloped appearance of the liver margins suggest malignant ascites. No parenchymal mass or lesion seen. Status post cholecystectomy. Pancreas: Unremarkable. No pancreatic ductal dilatation or surrounding inflammatory changes. Spleen: Normal in size without focal abnormality. Adrenals/Urinary Tract: LEFT kidney is absent. RIGHT kidney appears normal without mass, stone or hydronephrosis. Bladder is decompressed. Stomach/Bowel: Prominent thickening of the walls of the small bowel in the RIGHT lower quadrant and upper pelvis. No dilated large or small bowel loops. Diverticulosis of the sigmoid and descending colon but no focal inflammatory change to suggest acute diverticulitis. Vascular/Lymphatic: Aortic atherosclerosis. No enlarged abdominal or pelvic lymph nodes. Again noted are scattered small mesenteric and retroperitoneal  lymph nodes. Reproductive: Status post hysterectomy.  No adnexal mass seen. Other: Moderate amount of free fluid within the abdomen or pelvis, suspected malignant ascites. Musculoskeletal: No acute or suspicious osseous findings. IMPRESSION: 1. Prominent thickening of the walls of the small bowel loops in the RIGHT lower quadrant and upper pelvis, indicating small bowel enteritis of infectious or inflammatory nature. No bowel obstruction. 2. Moderate amount of free fluid within the abdomen and pelvis. Suspect malignant ascites, given the associated scalloping of the liver margins. No associated omental caking identified. 3. Small bilateral pleural effusions, similar to chest CT of 02/23/2018. 4. LEFT kidney is surgically absent. 5. Colonic diverticulosis without evidence of acute diverticulitis. 6.  Aortic Atherosclerosis (ICD10-I70.0). Electronically Signed   By: Franki Cabot M.D.   On: 03/03/2018 20:22   US Paracentesis  Result Date: 03/20/2018 INDICATION: Patient with history of fallopian tube carcinoma, ascites. Request made for diagnostic and therapeutic paracentesis. EXAM: ULTRASOUND GUIDED DIAGNOSTIC AND THERAPEUTIC PARACENTESIS MEDICATIONS: None COMPLICATIONS: None immediate. PROCEDURE: Informed written consent was obtained from the patient after a discussion of the risks, benefits and alternatives to treatment. A timeout was performed prior to the initiation of the procedure. Initial ultrasound scanning demonstrates a small amount of ascites within the left lower abdominal quadrant. The left lower abdomen was prepped and draped in the usual sterile fashion. 1% lidocaine was used for local anesthesia. Following this, a 19 gauge, 10-cm, Yueh catheter was introduced. An ultrasound image was saved for documentation purposes. The paracentesis was performed. The catheter was removed and a dressing was applied. The patient tolerated the procedure well without immediate post procedural complication. FINDINGS:  A total of approximately 950 cc of hazy, yellow fluid was removed. Samples were sent to the laboratory as requested by the clinical team. IMPRESSION: Successful ultrasound-guided diagnostic and therapeutic paracentesis yielding 950 cc of peritoneal fluid. Read by: Rowe Robert, PA-C Electronically Signed   By: Jacqulynn Cadet M.D.   On: 03/20/2018 15:54   Dg Chest Port 1 View  Result Date: 03/24/2018 CLINICAL DATA:  Shortness of breath today. EXAM: PORTABLE CHEST 1 VIEW COMPARISON:  Radiographs and CT yesterday. FINDINGS: Examination significantly rotated. Right chest port remains in place. Lungs are hyperinflated with central bronchial thickening. Heart size grossly unchanged from prior exam allowing for differences in rotation. Aortic atherosclerosis. Bibasilar atelectasis appears similar. Reticulonodular opacities on CT not well seen radiographically. No large pleural effusion or pneumothorax. IMPRESSION: Unchanged appearance of the chest allowing for patient rotation. Hyperinflation with central bronchial thickening and bibasilar atelectasis. Electronically Signed   By: Threasa Beards  Sanford M.D.   On: 03/24/2018 05:38    Microbiology: Recent Results (from the past 240 hour(s))  Culture, body fluid-bottle     Status: None   Collection Time: 03/20/18  2:58 PM  Result Value Ref Range Status   Specimen Description PERITONEAL  Final   Special Requests NONE  Final   Culture   Final    NO GROWTH 5 DAYS Performed at [facility], [address], [place], [place] [address]    Report Status 03/25/2018 FINAL  Final  Gram stain     Status: None   Collection Time: 03/20/18  2:58 PM  Result Value Ref Range Status   Specimen Description PERITONEAL  Final   Special Requests NONE  Final   Gram Stain   Final    NO WBC SEEN NO ORGANISMS SEEN Performed at [facility], [place] [address] [place], [place] [address]    Report Status 03/20/2018 FINAL  Final  MRSA PCR Screening     Status: None    Collection Time: 03/24/18 12:17 AM  Result Value Ref Range Status   MRSA by PCR NEGATIVE NEGATIVE Final    Comment:        The GeneXpert MRSA Assay (FDA approved for NASAL specimens only), is one component of a comprehensive MRSA colonization surveillance program. It is not intended to diagnose MRSA infection nor to guide or monitor treatment for MRSA infections. Performed at [facility], [place] [address], [place],  [address]      Labs: Basic Metabolic Panel: Recent Labs  Lab 03/23/18 1435 03/25/18 0330 03/25/18 0545 03/26/18 0608 03/27/18 0607  NA 141 QUESTIONABLE RESULTS, RECOMMEND RECOLLECT TO VERIFY 140 140 142  K 4.5 QUESTIONABLE RESULTS, RECOMMEND RECOLLECT TO VERIFY 3.9 3.7 3.8  CL 96* QUESTIONABLE RESULTS, RECOMMEND RECOLLECT TO VERIFY 91* 92* 91*  CO2 35* QUESTIONABLE RESULTS, RECOMMEND RECOLLECT TO VERIFY 40* 41* 43*  GLUCOSE 137* QUESTIONABLE RESULTS, RECOMMEND RECOLLECT TO VERIFY 162* 136* 178*  BUN 15 QUESTIONABLE RESULTS, RECOMMEND RECOLLECT TO VERIFY 17 23 24*  CREATININE 1.01* QUESTIONABLE RESULTS, RECOMMEND RECOLLECT TO VERIFY 0.99 0.98 0.90  CALCIUM 9.1 QUESTIONABLE RESULTS, RECOMMEND RECOLLECT TO VERIFY 8.4* 8.4* 8.8*  MG  --  QUESTIONABLE RESULTS, RECOMMEND RECOLLECT TO VERIFY 1.6* 1.9 1.8   Liver Function Tests: Recent Labs  Lab 03/25/18 0330 03/25/18 0545  AST QUESTIONABLE RESULTS, RECOMMEND RECOLLECT TO VERIFY 25  ALT QUESTIONABLE RESULTS, RECOMMEND RECOLLECT TO VERIFY 19  ALKPHOS QUESTIONABLE RESULTS, RECOMMEND RECOLLECT TO VERIFY 50  BILITOT QUESTIONABLE RESULTS, RECOMMEND RECOLLECT TO VERIFY 0.5  PROT QUESTIONABLE RESULTS, RECOMMEND RECOLLECT TO VERIFY 6.3*  ALBUMIN QUESTIONABLE RESULTS, RECOMMEND RECOLLECT TO VERIFY 2.7*   No results for input(s): LIPASE, AMYLASE in the last 168 hours. No results for input(s): AMMONIA in the last 168 hours. CBC: Recent Labs  Lab 03/23/18 1435 03/25/18 0330 03/25/18 0442  03/26/18 0608 03/27/18 0607  WBC 5.6 QUESTIONABLE RESULTS, RECOMMEND RECOLLECT TO VERIFY 7.6 10.4 11.3*  NEUTROABS 4.7 QUESTIONABLE RESULTS, RECOMMEND RECOLLECT TO VERIFY 6.4 8.6 9.0*  HGB 9.2* QUESTIONABLE RESULTS, RECOMMEND RECOLLECT TO VERIFY 8.8* 9.1* 9.4*  HCT 30.3* QUESTIONABLE RESULTS, RECOMMEND RECOLLECT TO VERIFY 29.4* 29.8* 30.8*  MCV 100.3* QUESTIONABLE RESULTS, RECOMMEND RECOLLECT TO VERIFY 101.0* 101.7* 102.3*  PLT 305 QUESTIONABLE RESULTS, RECOMMEND RECOLLECT TO VERIFY 310 345 363   Cardiac Enzymes: No results for input(s): CKTOTAL, CKMB, CKMBINDEX, TROPONINI in the last 168 hours. BNP: BNP (last 3 results) Recent Labs    03/23/18 1435  BNP 65.5    ProBNP (last 3 results) Recent Labs    12/17/17 1643  PROBNP 24.0    CBG: No results for input(s): GLUCAP in the last 168 hours.     Signed:  Cristal Ford  [facility] 03/27/2018, 1:30 PM

## 2018-03-28 ENCOUNTER — Telehealth: Payer: Self-pay

## 2018-03-28 NOTE — Telephone Encounter (Signed)
Spoke with the pt and informed her that Dr Meda Coffee was able to review her inpatient echo, and she noted stable findings.  Pt verbalized understanding and gracious for the call back.

## 2018-03-28 NOTE — Telephone Encounter (Signed)
Transition Care Management Follow-up Telephone Call  Admit date: 03/23/2018 Discharge date: 03/27/2018 Discharge Diagnoses: Acute on chronic hypoxic respiratory failure secondary to COPD exacerbation and pneumonia   How have you been since you were released from the hospital? "I'm much better, just tired"   Do you understand why you were in the hospital? yes   Do you understand the discharge instructions? yes   Where were you discharged to? Home. Lives with husband.    Items Reviewed:  Medications reviewed: no, pt request review at appt.   Allergies reviewed: yes  Dietary changes reviewed: yes  Referrals reviewed: yes, pulmonary appt 04/01/18   Functional Questionnaire:   Activities of Daily Living (ADLs):   She states they are independent in the following: ambulation, bathing and hygiene, feeding, continence, grooming, toileting and dressing States they require assistance with the following: None   Any transportation issues/concerns?: no   Any patient concerns? no   Confirmed importance and date/time of follow-up visits scheduled yes  Provider Appointment booked with PCP 04/02/18 @ 11:30.  Confirmed with patient if condition begins to worsen call PCP or go to the ER.  Patient was given the office number and encouraged to call back with question or concerns.  : yes

## 2018-03-29 ENCOUNTER — Ambulatory Visit: Payer: Medicare Other

## 2018-03-29 ENCOUNTER — Ambulatory Visit: Payer: Medicare Other | Admitting: Family Medicine

## 2018-03-31 NOTE — Progress Notes (Signed)
@Patient  ID: Lynn Ramirez, female    DOB: 1941/01/21, 77 y.o.   MRN: [id number]  Chief Complaint  Patient presents with  . Hospitalization Follow-up    Referring provider: Midge Minium, MD  HPI:  77 year old female former smoker (quit 2017) 40 pack years.  Followed in our office for GOLD COPD III (2013 - PFT), chronic cough, pleural effusions, chronic hypoxemia on 2 L O2  PMH: Hypertension, CAD, GERD, eczema, ovarian cancer, chronic kidney disease stage II Smoker/ Smoking History: Former smoker.  40 pack years. Maintenance: Stiolto and Arnuity Pt of: Dr. Lamonte Sakai   Last [facility] Pulmonary Encounter:  03/22/2018  - Acute Visit - BM   Main diagnosis: COPD exacerbation Plan: Continued prednisone taper, Z-Pak  04/01/2018  - Visit   77 year old patient presenting today for hospital follow-up from COPD exacerbation.  Patient reports that she is scheduled PCP follow-up for 03/29/2018.  Patient reports she is been doing well and just has been fatigued since being discharged.  Patient has completed her Levaquin prescription as prescribed.  Patient reports that she is still having a persistent wet sounding cough but it is nonproductive.  Patient would like to travel to [place] in December/2019 to see her grandchildren.  Patient also reports that they have been having ongoing issues with POC from [place].  Patient reports that battery is been going dead quicker as well as machine is making loud noises.  Unsure if the POC needs to be serviced.     Tests:   03/20/2018-chest x-ray-COPD no active acute disease 10/16/17-CT chest without contrast- several 2-4 nodular opacities throughout lungs, follow-up CT is recommended 03/23/2018-CT Angio- no evidence of PE, subtle reticular density over the lingula, may be due to atypical infection or inflammatory process, follow-up CT in 6 weeks  03/24/2018-chest x-ray- unchanged appearance of chest line per patient rotation, hyperinflation with  central bronchial thickening  10/09/2017-echocardiogram-LV ejection fraction 60 to 09%, grade 1 diastolic dysfunction 09/30/5571-[id number] ejection fraction 55 to 31%, grade 1 diastolic dysfunction   Chart Review:  03/23/2018-hospitalization-COPD exacerbation >>>Discharge date 03/27/2018 >>>Complete Levaquin >>>Follow-up with pulmonology    Specialty Problems      Pulmonary Problems   COPD exacerbation (HCC)   Malignant pleural effusion    CXR 09/2014: moderate right effusion CXR 12/2017 left effusion >cytology c/w metastatic adenocarcinoma >ov with Dr. Benay Spice follow up .       SOB (shortness of breath)   Chronic respiratory failure with hypoxia (HCC)    O2 2L cont> d/c'd May 2016   SATURATION QUALIFICATIONS: 09/28/14  Patient Saturations on Room Air at Rest = 85% - 06/16/2015   Walked RA  2 laps @ 185 ft each stopped due to  Sob/ desats to 83 brisk pace  - 06/23/2015  Walked RA x 3 laps @ 185 ft each stopped due to  Leg pain, nl pace, sats still 90%       Pulmonary nodules/lesions, multiple   COPD GOLD III D    05/06/2012-pulmonary function test- FVC 76%, postbronchodilator ratio 41, FEV1 44, DLCO 71 >>>Severe airflow obstruction no response to bronchodilator, DLCO is mildly reduced  COPD Exacerbation history >>>03/23/18 - Hospital admission from COPD Exac      Allergic rhinitis   Chronic cough   On home oxygen therapy   Acute and chronic respiratory failure with hypoxia (HCC)   Acute on chronic respiratory failure with hypoxia (HCC)   PNA (pneumonia)      Allergies  Allergen Reactions  . Latex Rash  Latex gloves ONLY (pt cannot wear them- no reaction if someone else touches her with latex gloves on)  . Penicillins Other (See Comments)    welts Has patient had a PCN reaction causing immediate rash, facial/tongue/throat swelling, SOB or lightheadedness with hypotension: Yes  Has patient had a PCN reaction causing severe rash involving mucus membranes or skin  necrosis:No Has patient had a PCN reaction that required hospitalization:No Has patient had a PCN reaction occurring within the last 10 years:No If all of the above answers are "NO", then may proceed with Cephalosporin use.     Immunization History  Administered Date(s) Administered  . Influenza Split 03/26/2014  . Influenza Whole 04/09/2012  . Influenza, High Dose Seasonal PF 03/10/2013, 04/25/2017  . Influenza,inj,Quad PF,6+ Mos 05/06/2015, 04/12/2016, 03/20/2018  . Influenza,inj,quad, With Preservative 04/09/2017  . Pneumococcal Conjugate-13 05/06/2015  . Pneumococcal Polysaccharide-23 05/17/2016  . Pneumococcal-Unspecified 07/10/2016    Past Medical History:  Diagnosis Date  . CKD (chronic kidney disease), stage II   . COPD (chronic obstructive pulmonary disease) ([place])   . Coronary artery calcification seen on CT scan   . Difficulty sleeping   . Eczema    hands  . Emphysema   . GERD (gastroesophageal reflux disease)   . H/O hydronephrosis   . History of transfusion    age 49  . Hyperlipidemia   . Hypertension   . Ovarian cancer ([place]) dx'd 09/2014   metastatic - prior malignant R pleural effusion and peritoneal carcinomatosis  . S/p nephrectomy     Tobacco History: Social History   Tobacco Use  Smoking Status Former Smoker  . Packs/day: 1.00  . Years: 40.00  . Pack years: 40.00  . Types: Cigarettes  . Last attempt to quit: 07/10/2013  . Years since quitting: 4.7  Smokeless Tobacco Never Used   Counseling given: Not Answered  Continue not smoking  Outpatient Encounter Medications as of 04/01/2018  Medication Sig  . acetaminophen (TYLENOL) 650 MG CR tablet Take 650 mg by mouth every 8 (eight) hours as needed for pain.  Marland Kitchen albuterol (PROVENTIL) (2.5 MG/3ML) 0.083% nebulizer solution INHALE THE CONTENTS OF 1 VIAL VIA NEBULIZER EVERY 6 HOURS AS NEEDED FOR WHEEZING OR SHORTNESS OF BREATH (Patient taking differently: Take 2.5 mg by nebulization every 6 (six) hours as  needed for wheezing or shortness of breath. )  . antiseptic oral rinse (BIOTENE) LIQD 15 mLs by Mouth Rinse route 3 (three) times daily.  Marland Kitchen atorvastatin (LIPITOR) 10 MG tablet TAKE 1 TABLET(10 MG) BY MOUTH DAILY (Patient taking differently: Take 10 mg by mouth daily at 6 PM. )  . Biotin 2500 MCG CAPS Take 1 tablet by mouth daily.  . carvedilol (COREG) 3.125 MG tablet TAKE 1 TABLET(3.125 MG) BY MOUTH TWICE DAILY WITH A MEAL (Patient taking differently: Take 3.125 mg by mouth 2 (two) times daily with a meal. )  . cholecalciferol (VITAMIN D) 1000 units tablet Take 1,000 Units by mouth daily.  . diphenoxylate-atropine (LOMOTIL) 2.5-0.025 MG tablet TAKE 2 TABLETS BY MOUTH FOUR TIMES DAILY AS NEEDED FOR DIARRHEA/ LOOSE STOOLS  . esomeprazole (NEXIUM) 40 MG capsule Take 40 mg by mouth daily at 12 noon.  . fluocinonide cream (LIDEX) 7.82 % Apply 1 application topically 2 (two) times daily as needed (For ezcema).   . Fluticasone Furoate (ARNUITY ELLIPTA) 200 MCG/ACT AEPB Inhale 1 puff into the lungs daily.  . furosemide (LASIX) 40 MG tablet Take 1 tablet (40 mg total) by mouth daily.  Marland Kitchen gabapentin (NEURONTIN)  300 MG capsule TAKE 1 CAPSULE BY MOUTH EVERY NIGHT AT BEDTIME  . guaiFENesin (ROBITUSSIN) 100 MG/5ML SOLN Take 5 mLs (100 mg total) by mouth every 4 (four) hours as needed for cough or to loosen phlegm.  . hydrOXYzine (VISTARIL) 25 MG capsule Take 1 capsule (25 mg total) by mouth 4 (four) times daily as needed for itching.  Marland Kitchen levofloxacin (LEVAQUIN) 750 MG tablet Take 1 tablet (750 mg total) by mouth daily.  Marland Kitchen lidocaine-prilocaine (EMLA) cream Apply to port site one hour prior to use. Do not rub in. Cover with plastic.  Marland Kitchen loratadine (CLARITIN) 10 MG tablet Take 10 mg by mouth daily.  . ondansetron (ZOFRAN-ODT) 4 MG disintegrating tablet Take 1 tablet (4 mg total) by mouth every 8 (eight) hours as needed for nausea or vomiting.  Marland Kitchen oxyCODONE-acetaminophen (PERCOCET/ROXICET) 5-325 MG tablet Take 1 tablet  by mouth every 6 (six) hours as needed for severe pain.  Marland Kitchen Potassium Chloride ER 20 MEQ TBCR TAKE 1/2 TABLET BY MOUTH DAILY  . PROAIR HFA 108 (90 Base) MCG/ACT inhaler INHALE 2 PUFFS INTO THE LUNGS FOUR TIMES DAILY AS NEEDED FOR WHEEZING (Patient taking differently: Inhale 2 puffs into the lungs 4 (four) times daily as needed for wheezing. )  . pyridoxine (B-6) 100 MG tablet Take 100 mg by mouth daily.  Marland Kitchen STIOLTO RESPIMAT 2.5-2.5 MCG/ACT AERS INHALE 2 PUFFS INTO THE LUNGS DAILY (Patient taking differently: Take 2 puffs by mouth daily. )  . traMADol (ULTRAM) 50 MG tablet Take 1 tablet (50 mg total) by mouth every 12 (twelve) hours as needed. for pain  . vitamin B-12 (CYANOCOBALAMIN) 100 MCG tablet Take 100 mcg by mouth daily. Reported on 01/19/2016  . zolpidem (AMBIEN) 5 MG tablet Take 1 tablet (5 mg total) by mouth at bedtime as needed for sleep.  . benzonatate (TESSALON) 200 MG capsule Take 1 capsule (200 mg total) by mouth 3 (three) times daily as needed for cough.  . predniSONE (DELTASONE) 10 MG tablet Take 2 tablets (20mg  total) daily for the next 5 days. Take in the AM with food. (Patient not taking: Reported on 04/01/2018)  . Tiotropium Bromide-Olodaterol (STIOLTO RESPIMAT) 2.5-2.5 MCG/ACT AERS Inhale 2 puffs into the lungs daily.  . [DISCONTINUED] ARNUITY ELLIPTA 200 MCG/ACT AEPB INHALE 1 PUFF INTO THE LUNGS DAILY (Patient taking differently: INHALE 1 PUFF INTO THE LUNGS DAILY)   No facility-administered encounter medications on file as of 04/01/2018.      Review of Systems  Review of Systems  Constitutional: Positive for fatigue. Negative for chills, fever and unexpected weight change.  HENT: Negative for congestion, ear pain, postnasal drip, sinus pressure and sinus pain.   Respiratory: Positive for cough (sounds wet, non productive ) and shortness of breath. Negative for chest tightness and wheezing.   Cardiovascular: Negative for chest pain and palpitations.  Gastrointestinal:  Negative for blood in stool, diarrhea, nausea and vomiting.  Genitourinary: Negative for dysuria, frequency and urgency.  Musculoskeletal: Negative for arthralgias.  Skin: Negative for color change.  Allergic/Immunologic: Negative for environmental allergies and food allergies.  Neurological: Negative for dizziness, light-headedness and headaches.  Psychiatric/Behavioral: Positive for sleep disturbance. Negative for dysphoric mood. The patient is not nervous/anxious.   All other systems reviewed and are negative.    Physical Exam  BP 114/68 (BP Location: Left Arm, Cuff Size: Large)   Pulse 87   Ht 5\' 1"  (1.549 m)   Wt 165 lb 6.4 oz (75 kg)   SpO2 92%   BMI  31.25 kg/m   Wt Readings from Last 5 Encounters:  04/01/18 165 lb 6.4 oz (75 kg)  03/25/18 166 lb 14.2 oz (75.7 kg)  03/22/18 171 lb 12.8 oz (77.9 kg)  03/20/18 175 lb 12.8 oz (79.7 kg)  03/13/18 174 lb 4.8 oz (79.1 kg)     Physical Exam  Constitutional: She is oriented to person, place, and time and well-developed, well-nourished, and in no distress. No distress.  HENT:  Head: Normocephalic and atraumatic.  Right Ear: Hearing, tympanic membrane, external ear and ear canal normal.  Left Ear: Hearing, tympanic membrane, external ear and ear canal normal.  Nose: Nose normal. Right sinus exhibits no maxillary sinus tenderness and no frontal sinus tenderness. Left sinus exhibits no maxillary sinus tenderness and no frontal sinus tenderness.  Mouth/Throat: Uvula is midline and oropharynx is clear and moist. No oropharyngeal exudate.  Eyes: Pupils are equal, round, and reactive to light.  Neck: Normal range of motion. Neck supple. No JVD present.  Cardiovascular: Normal rate, regular rhythm and normal heart sounds.  Pulmonary/Chest: Effort normal. No accessory muscle usage. No respiratory distress. She has no decreased breath sounds. She has wheezes (exp wheeze ). She has no rhonchi.  Abdominal: Soft. Bowel sounds are normal.  There is no tenderness.  Musculoskeletal: Normal range of motion. She exhibits no edema.  Lymphadenopathy:    She has no cervical adenopathy.  Neurological: She is alert and oriented to person, place, and time. Gait normal.  Skin: Skin is warm and dry. She is not diaphoretic. No erythema.  Psychiatric: Mood, memory, affect and judgment normal.  Nursing note and vitals reviewed.     Lab Results:  CBC    Component Value Date/Time   WBC 11.3 (H) 03/27/2018 0607   RBC 3.01 (L) 03/27/2018 0607   HGB 9.4 (L) 03/27/2018 0607   HGB 9.1 (L) 03/20/2018 1037   HGB 11.5 (L) 07/04/2017 1122   HCT 30.8 (L) 03/27/2018 0607   HCT 35.7 07/04/2017 1122   PLT 363 03/27/2018 0607   PLT 313 03/20/2018 1037   PLT 298 07/04/2017 1122   MCV 102.3 (H) 03/27/2018 0607   MCV 92.2 07/04/2017 1122   MCH 31.2 03/27/2018 0607   MCHC 30.5 03/27/2018 0607   RDW 19.1 (H) 03/27/2018 0607   RDW 19.8 (H) 07/04/2017 1122   LYMPHSABS 1.3 03/27/2018 0607   LYMPHSABS 1.1 07/04/2017 1122   MONOABS 1.0 03/27/2018 0607   MONOABS 0.6 07/04/2017 1122   EOSABS 0.0 03/27/2018 0607   EOSABS 0.2 07/04/2017 1122   BASOSABS 0.0 03/27/2018 0607   BASOSABS 0.0 07/04/2017 1122    BMET    Component Value Date/Time   NA 142 03/27/2018 0607   NA 137 07/04/2017 1122   K 3.8 03/27/2018 0607   K 4.1 07/04/2017 1122   CL 91 (L) 03/27/2018 0607   CO2 43 (H) 03/27/2018 0607   CO2 30 (H) 07/04/2017 1122   GLUCOSE 178 (H) 03/27/2018 0607   GLUCOSE 104 07/04/2017 1122   BUN 24 (H) 03/27/2018 0607   BUN 15.8 07/04/2017 1122   CREATININE 0.90 03/27/2018 0607   CREATININE 0.89 03/20/2018 1037   CREATININE 0.9 07/04/2017 1122   CALCIUM 8.8 (L) 03/27/2018 0607   CALCIUM 9.4 07/04/2017 1122   GFRNONAA >60 03/27/2018 0607   GFRNONAA >60 03/20/2018 1037   GFRAA >60 03/27/2018 0607   GFRAA >60 03/20/2018 1037    BNP    Component Value Date/Time   BNP 65.5 03/23/2018 1435  ProBNP    Component Value Date/Time    PROBNP 24.0 12/17/2017 1643    Imaging: Dg Chest 2 View  Result Date: 03/23/2018 CLINICAL DATA:  Patient with shortness of breath EXAM: CHEST - 2 VIEW COMPARISON:  Chest radiograph 03/20/2018 FINDINGS: Right anterior chest wall Port-A-Cath is present tip projecting over the superior vena cava. Monitoring leads overlie the patient. Stable cardiomegaly. Aortic atherosclerosis. Bibasilar heterogeneous pulmonary opacities. No pleural effusion or pneumothorax. Thoracic spine degenerative changes posttraumatic deformity proximal right humerus. IMPRESSION: Bibasilar opacities favored to represent atelectasis. Infection not excluded. Electronically Signed   By: Lovey Newcomer M.D.   On: 03/23/2018 16:18   Dg Chest 2 View  Result Date: 03/21/2018 CLINICAL DATA:  Follow-up pleural effusion.  Shortness of breath. EXAM: CHEST - 2 VIEW COMPARISON:  02/22/2018 FINDINGS: Right Port-A-Cath remains in place, unchanged. There is hyperinflation of the lungs compatible with COPD. Lingular scarring. No confluent airspace opacities. No visible significant effusions. No acute bony abnormality. IMPRESSION: COPD.  No active disease. Electronically Signed   By: Rolm Baptise M.D.   On: 03/21/2018 08:05   Ct Angio Chest Pe W And/or Wo Contrast  Result Date: 03/23/2018 CLINICAL DATA:  Shortness of breath with increased cough and wheezing. EXAM: CT ANGIOGRAPHY CHEST WITH CONTRAST TECHNIQUE: Multidetector CT imaging of the chest was performed using the standard protocol during bolus administration of intravenous contrast. Multiplanar CT image reconstructions and MIPs were obtained to evaluate the vascular anatomy. CONTRAST:  173mL ISOVUE-370 IOPAMIDOL (ISOVUE-370) INJECTION 76% COMPARISON:  02/23/2018, 10/16/2017 and 08/08/2015 and abdominal CT 03/03/2018 FINDINGS: Cardiovascular: Heart is normal size. There is calcified plaque over the thoracic aorta which is otherwise normal in caliber. Pulmonary arterial system is within normal  without emboli. Mediastinum/Nodes: No mediastinal or hilar adenopathy. Remaining mediastinal structures are normal. Lungs/Pleura: Lungs are adequately inflated demonstrate mild centrilobular emphysematous disease. There is resolved bibasilar atelectasis. Stable small amount right pleural fluid. Subtle focal reticulonodular opacification over the lingula and sub solid nodular opacification over the superior segment left lower lobe. Airways are within normal. Upper Abdomen: Free fluid over the left upper quadrant unchanged. Calcified plaque over the abdominal aorta. Remaining visualized upper abdomen is unchanged. Musculoskeletal: Degenerate change of the spine. Old right humeral fracture. Review of the MIP images confirms the above findings. IMPRESSION: No evidence of pulmonary embolism. Subtle reticulonodular density over the lingula and sub solid subcentimeter nodule opacification over the superior segment left lower lobe. Small stable right pleural effusion. Findings may be due to atypical infectious or inflammatory process. Recommend follow-up CT 6 weeks. Aortic Atherosclerosis (ICD10-I70.0) and Emphysema (ICD10-J43.9). Free fluid over the left upper quadrant unchanged. Electronically Signed   By: Marin Olp M.D.   On: 03/23/2018 16:39   Ct Abdomen Pelvis W Contrast  Result Date: 03/03/2018 CLINICAL DATA:  Generalized abdominal pain with vomiting since yesterday. History of ovarian cancer. EXAM: CT ABDOMEN AND PELVIS WITH CONTRAST TECHNIQUE: Multidetector CT imaging of the abdomen and pelvis was performed using the standard protocol following bolus administration of intravenous contrast. CONTRAST:  172mL ISOVUE-300 IOPAMIDOL (ISOVUE-300) INJECTION 61% COMPARISON:  CT abdomen dated 01/04/2018. CT abdomen dated 09/10/2017. FINDINGS: Lower chest: Small bilateral pleural effusions. LEFT pleural effusion is decreased compared to the most recent abdomen CT of 01/04/2018, similar to interval chest CT of  02/23/2018. Hepatobiliary: Perihepatic ascites. Scalloped appearance of the liver margins suggest malignant ascites. No parenchymal mass or lesion seen. Status post cholecystectomy. Pancreas: Unremarkable. No pancreatic ductal dilatation or surrounding inflammatory changes. Spleen: Normal  in size without focal abnormality. Adrenals/Urinary Tract: LEFT kidney is absent. RIGHT kidney appears normal without mass, stone or hydronephrosis. Bladder is decompressed. Stomach/Bowel: Prominent thickening of the walls of the small bowel in the RIGHT lower quadrant and upper pelvis. No dilated large or small bowel loops. Diverticulosis of the sigmoid and descending colon but no focal inflammatory change to suggest acute diverticulitis. Vascular/Lymphatic: Aortic atherosclerosis. No enlarged abdominal or pelvic lymph nodes. Again noted are scattered small mesenteric and retroperitoneal lymph nodes. Reproductive: Status post hysterectomy.  No adnexal mass seen. Other: Moderate amount of free fluid within the abdomen or pelvis, suspected malignant ascites. Musculoskeletal: No acute or suspicious osseous findings. IMPRESSION: 1. Prominent thickening of the walls of the small bowel loops in the RIGHT lower quadrant and upper pelvis, indicating small bowel enteritis of infectious or inflammatory nature. No bowel obstruction. 2. Moderate amount of free fluid within the abdomen and pelvis. Suspect malignant ascites, given the associated scalloping of the liver margins. No associated omental caking identified. 3. Small bilateral pleural effusions, similar to chest CT of 02/23/2018. 4. LEFT kidney is surgically absent. 5. Colonic diverticulosis without evidence of acute diverticulitis. 6.  Aortic Atherosclerosis (ICD10-I70.0). Electronically Signed   By: Franki Cabot M.D.   On: 03/03/2018 20:22   US Paracentesis  Result Date: 03/20/2018 INDICATION: Patient with history of fallopian tube carcinoma, ascites. Request made for  diagnostic and therapeutic paracentesis. EXAM: ULTRASOUND GUIDED DIAGNOSTIC AND THERAPEUTIC PARACENTESIS MEDICATIONS: None COMPLICATIONS: None immediate. PROCEDURE: Informed written consent was obtained from the patient after a discussion of the risks, benefits and alternatives to treatment. A timeout was performed prior to the initiation of the procedure. Initial ultrasound scanning demonstrates a small amount of ascites within the left lower abdominal quadrant. The left lower abdomen was prepped and draped in the usual sterile fashion. 1% lidocaine was used for local anesthesia. Following this, a 19 gauge, 10-cm, Yueh catheter was introduced. An ultrasound image was saved for documentation purposes. The paracentesis was performed. The catheter was removed and a dressing was applied. The patient tolerated the procedure well without immediate post procedural complication. FINDINGS: A total of approximately 950 cc of hazy, yellow fluid was removed. Samples were sent to the laboratory as requested by the clinical team. IMPRESSION: Successful ultrasound-guided diagnostic and therapeutic paracentesis yielding 950 cc of peritoneal fluid. Read by: Rowe Robert, PA-C Electronically Signed   By: Jacqulynn Cadet M.D.   On: 03/20/2018 15:54   Dg Chest Port 1 View  Result Date: 03/24/2018 CLINICAL DATA:  Shortness of breath today. EXAM: PORTABLE CHEST 1 VIEW COMPARISON:  Radiographs and CT yesterday. FINDINGS: Examination significantly rotated. Right chest port remains in place. Lungs are hyperinflated with central bronchial thickening. Heart size grossly unchanged from prior exam allowing for differences in rotation. Aortic atherosclerosis. Bibasilar atelectasis appears similar. Reticulonodular opacities on CT not well seen radiographically. No large pleural effusion or pneumothorax. IMPRESSION: Unchanged appearance of the chest allowing for patient rotation. Hyperinflation with central bronchial thickening and  bibasilar atelectasis. Electronically Signed   By: Keith Rake M.D.   On: 03/24/2018 05:38      Assessment & Plan:   Pleasant 77 year old patient seen office visit today.  Patient recovering well from COPD exacerbation.  Will follow patient CT Angio results with CT without contrast in 6 weeks.  Patient to continue follow-up with primary care and oncology.  Continue Arnuity and Stiolto.  Could consider nebulized budesonide instead of Arnuity Ellipta. Order placed to [facility] to follow up on  POC.  Tessalon Perle prescription provided today.  Patient to follow-up if symptoms worsen.  COPD GOLD III D Continue Arnuity Ellipta  >>> Take 1 puff daily in the morning right when you wake up >>>Rinse your mouth out after use >>>This is a daily maintenance inhaler, NOT a rescue inhaler >>>Contact our office if you are having difficulties affording or obtaining this medication >>>It is important for you to be able to take this daily and not miss any doses  Stiolto Respimat inhaler >>>2 puffs daily >>>Take this no matter what >>>This is not a rescue inhaler  Ensure oxygenation remains greater than 90% >>>Check her oxygenation levels at home with a portable pulse ox  Note your daily symptoms > remember "red flags" for COPD:   >>>Increase in cough >>>increase in sputum production >>>increase in shortness of breath or activity  intolerance.   If you notice these symptoms, please call the office to be seen.   We sent a order to [facility] for them to follow-up with you regarding your portable oxygen concentrator   We can continue to monitor your trending CO2 >>> Could consider sleep study in the future if CO2 levels continue to trend up  Keep follow-up with Dr. Lamonte Sakai in December/2019   Abnormal finding on lung imaging Repeat CT without contrast in end of October/beginning of November 2019  Keep follow-up with Dr. Lamonte Sakai in December/2019   Chronic respiratory failure with hypoxia  ([place])  Ensure oxygenation remains greater than 90% >>>Check her oxygenation levels at home with a portable pulse ox  We sent a order to [facility] for them to follow-up with you regarding your portable oxygen concentrator   We can continue to monitor your trending CO2 >>> Could consider sleep study in the future if CO2 levels continue to trend up  Keep follow-up with Dr. Lamonte Sakai in December/2019   Chronic cough Printed Ladona Ridgel prescription provided for patient  >>>can use every 8 hours as needed for cough  Can use Delsym over-the-counter cough syrup  Can use codeine cough syrup from primary care, this is sedating, only use at night when having trouble sleeping >>>Be careful of CO2 levels are trending up     Lauraine Rinne, NP 04/01/2018

## 2018-04-01 ENCOUNTER — Ambulatory Visit (INDEPENDENT_AMBULATORY_CARE_PROVIDER_SITE_OTHER): Payer: Medicare Other | Admitting: Pulmonary Disease

## 2018-04-01 ENCOUNTER — Encounter: Payer: Self-pay | Admitting: Pulmonary Disease

## 2018-04-01 ENCOUNTER — Other Ambulatory Visit: Payer: Self-pay | Admitting: Emergency Medicine

## 2018-04-01 DIAGNOSIS — R053 Chronic cough: Secondary | ICD-10-CM

## 2018-04-01 DIAGNOSIS — J441 Chronic obstructive pulmonary disease with (acute) exacerbation: Secondary | ICD-10-CM | POA: Diagnosis not present

## 2018-04-01 DIAGNOSIS — J9611 Chronic respiratory failure with hypoxia: Secondary | ICD-10-CM

## 2018-04-01 DIAGNOSIS — R918 Other nonspecific abnormal finding of lung field: Secondary | ICD-10-CM | POA: Insufficient documentation

## 2018-04-01 DIAGNOSIS — R05 Cough: Secondary | ICD-10-CM

## 2018-04-01 DIAGNOSIS — J449 Chronic obstructive pulmonary disease, unspecified: Secondary | ICD-10-CM

## 2018-04-01 MED ORDER — TIOTROPIUM BROMIDE-OLODATEROL 2.5-2.5 MCG/ACT IN AERS: 2.0000 | INHALATION_SPRAY | Freq: Every day | RESPIRATORY_TRACT | 0 refills | Status: DC

## 2018-04-01 MED ORDER — BENZONATATE 200 MG PO CAPS: 200.0000 mg | ORAL_CAPSULE | Freq: Three times a day (TID) | ORAL | 1 refills | Status: DC | PRN

## 2018-04-01 NOTE — Assessment & Plan Note (Signed)
Soil scientist prescription provided for patient  >>>can use every 8 hours as needed for cough  Can use Delsym over-the-counter cough syrup  Can use codeine cough syrup from primary care, this is sedating, only use at night when having trouble sleeping >>>Be careful of CO2 levels are trending up

## 2018-04-01 NOTE — Assessment & Plan Note (Signed)
Continue Arnuity Ellipta  >>> Take 1 puff daily in the morning right when you wake up >>>Rinse your mouth out after use >>>This is a daily maintenance inhaler, NOT a rescue inhaler >>>Contact our office if you are having difficulties affording or obtaining this medication >>>It is important for you to be able to take this daily and not miss any doses  Stiolto Respimat inhaler >>>2 puffs daily >>>Take this no matter what >>>This is not a rescue inhaler  Ensure oxygenation remains greater than 90% >>>Check her oxygenation levels at home with a portable pulse ox  Note your daily symptoms > remember "red flags" for COPD:   >>>Increase in cough >>>increase in sputum production >>>increase in shortness of breath or activity  intolerance.   If you notice these symptoms, please call the office to be seen.   We sent a order to [facility] for them to follow-up with you regarding your portable oxygen concentrator   We can continue to monitor your trending CO2 >>> Could consider sleep study in the future if CO2 levels continue to trend up  Keep follow-up with Dr. Lamonte Sakai in December/2019

## 2018-04-01 NOTE — Assessment & Plan Note (Signed)
Repeat CT without contrast in end of October/beginning of November 2019  Keep follow-up with Dr. Lamonte Sakai in December/2019

## 2018-04-01 NOTE — Patient Instructions (Addendum)
Continue Arnuity Ellipta  >>> Take 1 puff daily in the morning right when you wake up >>>Rinse your mouth out after use >>>This is a daily maintenance inhaler, NOT a rescue inhaler >>>Contact our office if you are having difficulties affording or obtaining this medication >>>It is important for you to be able to take this daily and not miss any doses  Stiolto Respimat inhaler >>>2 puffs daily >>>Take this no matter what >>>This is not a rescue inhaler  Repeat CT without contrast in end of October/beginning of November 2019  Keep follow-up with primary care Dr. Charlene Brooke to continue with physical therapy Keep follow-up with oncology  Ensure oxygenation remains greater than 90% >>>Check her oxygenation levels at home with a portable pulse ox   Note your daily symptoms > remember "red flags" for COPD:   >>>Increase in cough >>>increase in sputum production >>>increase in shortness of breath or activity  intolerance.   If you notice these symptoms, please call the office to be seen.    We sent a order to [facility] for them to follow-up with you regarding your portable oxygen concentrator   We can continue to monitor your trending CO2 >>> Could consider sleep study in the future if CO2 levels continue to trend up   Keep follow-up with Dr. Lamonte Sakai in December/2019  I believe it is okay for you to travel to [facility] for Christmas 2019 >>>Follow-up with our office so you can have an antibiotic as well as a prednisone taper to travel with due to your COPD   It is flu season:   >>>Remember to be washing your hands regularly, using hand sanitizer, be careful to use around herself with has contact with people who are sick will increase her chances of getting sick yourself. >>> Best ways to protect herself from the flu: Receive the yearly flu vaccine, practice good hand hygiene washing with soap and also using hand sanitizer when available, eat a nutritious meals, get adequate  rest, hydrate appropriately    As of 05/13/2018 we will be moving! We will no longer be at our [place] location.   Our new address and phone number will be:  [address] [place], [place] [address] Telephone number: [phone]   Please contact the office if your symptoms worsen or you have concerns that you are not improving.   Thank you for choosing [facility] for your healthcare, and for allowing [place] to partner with you on your healthcare journey. I am thankful to be able to provide care to you today.   Wyn Quaker FNP-C     Home Oxygen Use, Adult When a medical condition keeps you from getting enough oxygen, your health care provider may instruct you to take extra oxygen at home. Your health care provider will let you know:  When to take oxygen.  For how long to take oxygen.  How quickly oxygen should be delivered (flow rate), in liters per minute (LPM or L/M).  Home oxygen can be given through:  A mask.  A nasal cannula. This is a device or tube that goes in the nostrils.  A transtracheal catheter. This is a small, flexible tube placed in the trachea.  A tracheostomy. This is a surgically made opening in the trachea.  These devices are connected with tubing to an oxygen source, such as:  A tank. Tanks hold oxygen in gas form. They must be replaced when the oxygen is used up.  A liquid oxygen  device. This holds oxygen in liquid form. It must be replaced when the oxygen is used up.  An oxygen concentrator machine. This filters oxygen in the room. It uses electricity, so you must have a backup cylinder of oxygen in case the power goes out.  Supplies needed: To use oxygen, you will need:  A mask, nasal cannula, transtracheal catheter, or tracheostomy.  An oxygen tank, a liquid oxygen device, or an oxygen concentrator.  The tape that your health care provider recommends (optional).  If you use a transtracheal catheter and your prescribed flow  rate is 1 LPM or greater, you will also need a humidifier. Risks and complications  Fire. This can happen if the oxygen is exposed to a heat source, flame, or spark.  Injury to skin. This can happen if liquid oxygen touches your skin.  Organ damage. This can happen if you get too little oxygen. How to use oxygen Your health care provider will show you how to use your oxygen device. Follow her or his instructions. They may look something like this: 1. Wash your hands. 2. If you use an oxygen concentrator, make sure it is plugged in. 3. Place one end of the tube into the port on the tank, device, or machine. 4. Place the mask over your nose and mouth. Or, place the nasal cannula and secure it with tape if instructed. If you use a tracheostomy or transtracheal catheter, connect it to the oxygen source as directed. 5. Make sure the liter-flow setting on the machine is at the level prescribed by your health care provider. 6. Turn on the machine or adjust the knob on the tank or device to the correct liter-flow setting. 7. When you are done, turn off and unplug the machine, or turn the knob to OFF.  How to clean and care for the oxygen supplies Nasal cannula  Clean it with a warm, wet cloth daily or as needed.  Wash it with a liquid soap once a week.  Rinse it thoroughly once or twice a week.  Replace it every 2-4 weeks.  If you have an infection, such as a cold or pneumonia, change the cannula when you get better. Mask  Replace it every 2-4 weeks.  If you have an infection, such as a cold or pneumonia, change the mask when you get better. Humidifier bottle  Wash the bottle between each refill: ? Wash it with soap and warm water. ? Rinse it thoroughly. ? Disinfect it and its top. ? Air-dry it.  Make sure it is dry before you refill it. Oxygen concentrator  Clean the air filter at least twice a week according to directions from your home medical equipment and service  company.  Wipe down the cabinet every day. To do this: ? Unplug the unit. ? Wipe down the cabinet with a damp cloth. ? Dry the cabinet. Other equipment  Change any extra tubing every 1-3 months.  Follow instructions from your health care provider about taking care of any other equipment. Safety tips Fire safety tips   Keep your oxygen and oxygen supplies at least 5 ft away from sources of heat, flames, and sparks at all times.  Do not allow smoking near your oxygen. Put up "no smoking" signs in your home.  Do not use materials that can burn (are flammable) while you use oxygen.  When you go to a restaurant with portable oxygen, ask to be seated in the nonsmoking section.  Keep a Data processing manager close  by. Let your fire department know that you have oxygen in your home.  Test your home smoke detectors regularly. General safety tips  If you use an oxygen cylinder, make sure it is in a stand or secured to an object that will not move (fixed object).  If you use liquid oxygen, make sure its container is kept upright.  If you use an oxygen concentrator: ? Dance movement psychotherapist company. Make sure you are given priority service in the event that your power goes out. ? Avoid using extension cords, if possible. Follow these instructions at home:  Use oxygen only as told by your health care provider.  Do not use alcohol or other drugs that make you relax (sedating drugs) unless instructed. They can slow down your breathing rate and make it hard to get in enough oxygen.  Know how and when to order a refill of oxygen.  Always keep a spare tank of oxygen. Plan ahead for holidays when you may not be able to get a prescription filled.  Use water-based lubricants on your lips or nostrils. Do not use oil-based products like petroleum jelly.  To prevent skin irritation on your cheeks or behind your ears, tuck some gauze under the tubing. Contact a health care provider if:  You get  headaches often.  You have shortness of breath.  You have a lasting cough.  You have anxiety.  You are sleepy all the time.  You develop an illness that affects your breathing.  You cannot exercise at your regular level.  You are restless.  You have difficult or irregular breathing, and it is getting worse.  You have a fever.  You have persistent redness under your nose. Get help right away if:  You are confused.  You have blue lips or fingernails.  You are struggling to breathe. This information is not intended to replace advice given to you by your health care provider. Make sure you discuss any questions you have with your health care provider. Document Released: 09/16/2003 Document Revised: 02/23/2016 Document Reviewed: 01/18/2016 Elsevier Interactive Patient Education  2018 Elsevier Inc.        Chronic Obstructive Pulmonary Disease Chronic obstructive pulmonary disease (COPD) is a long-term (chronic) lung problem. When you have COPD, it is hard for air to get in and out of your lungs. The way your lungs work will never return to normal. Usually the condition gets worse over time. There are things you can do to keep yourself as healthy as possible. Your doctor may treat your condition with:  Medicines.  Quitting smoking, if you smoke.  Rehabilitation. This may involve a team of specialists.  Oxygen.  Exercise and changes to your diet.  Lung surgery.  Comfort measures (palliative care).  Follow these instructions at home: Medicines  Take over-the-counter and prescription medicines only as told by your doctor.  Talk to your doctor before taking any cough or allergy medicines. You may need to avoid medicines that cause your lungs to be dry. Lifestyle  If you smoke, stop. Smoking makes the problem worse. If you need help quitting, ask your doctor.  Avoid being around things that make your breathing worse. This may include smoke, chemicals, and  fumes.  Stay active, but remember to also rest.  Learn and use tips on how to relax.  Make sure you get enough sleep. Most adults need at least 7 hours a night.  Eat healthy foods. Eat smaller meals more often. Rest before meals. Controlled breathing  Learn and use tips on how to control your breathing as told by your doctor. Try: ? Breathing in (inhaling) through your nose for 1 second. Then, pucker your lips and breath out (exhale) through your lips for 2 seconds. ? Putting one hand on your belly (abdomen). Breathe in slowly through your nose for 1 second. Your hand on your belly should move out. Pucker your lips and breathe out slowly through your lips. Your hand on your belly should move in as you breathe out. Controlled coughing  Learn and use controlled coughing to clear mucus from your lungs. The steps are: 1. Lean your head a little forward. 2. Breathe in deeply. 3. Try to hold your breath for 3 seconds. 4. Keep your mouth slightly open while coughing 2 times. 5. Spit any mucus out into a tissue. 6. Rest and do the steps again 1 or 2 times as needed. General instructions  Make sure you get all the shots (vaccines) that your doctor recommends. Ask your doctor about a flu shot and a pneumonia shot.  Use oxygen therapy and therapy to help improve your lungs (pulmonary rehabilitation) if told by your doctor. If you need home oxygen therapy, ask your doctor if you should buy a tool to measure your oxygen level (oximeter).  Make a COPD action plan with your doctor. This helps you know what to do if you feel worse than usual.  Manage any other conditions you have as told by your doctor.  Avoid going outside when it is very hot, cold, or humid.  Avoid people who have a sickness you can catch (contagious).  Keep all follow-up visits as told by your doctor. This is important. Contact a doctor if:  You cough up more mucus than usual.  There is a change in the color or thickness  of the mucus.  It is harder to breathe than usual.  Your breathing is faster than usual.  You have trouble sleeping.  You need to use your medicines more often than usual.  You have trouble doing your normal activities such as getting dressed or walking around the house. Get help right away if:  You have shortness of breath while resting.  You have shortness of breath that stops you from: ? Being able to talk. ? Doing normal activities.  Your chest hurts for longer than 5 minutes.  Your skin color is more blue than usual.  Your pulse oximeter shows that you have low oxygen for longer than 5 minutes.  You have a fever.  You feel too tired to breathe normally. Summary  Chronic obstructive pulmonary disease (COPD) is a long-term lung problem.  The way your lungs work will never return to normal. Usually the condition gets worse over time. There are things you can do to keep yourself as healthy as possible.  Take over-the-counter and prescription medicines only as told by your doctor.  If you smoke, stop. Smoking makes the problem worse. This information is not intended to replace advice given to you by your health care provider. Make sure you discuss any questions you have with your health care provider. Document Released: 12/13/2007 Document Revised: 12/02/2015 Document Reviewed: 02/20/2013 Elsevier Interactive Patient Education  2017 [employer].

## 2018-04-01 NOTE — Assessment & Plan Note (Signed)
  Ensure oxygenation remains greater than 90% >>>Check her oxygenation levels at home with a portable pulse ox  We sent a order to [facility] for them to follow-up with you regarding your portable oxygen concentrator   We can continue to monitor your trending CO2 >>> Could consider sleep study in the future if CO2 levels continue to trend up  Keep follow-up with Dr. Lamonte Sakai in December/2019

## 2018-04-02 ENCOUNTER — Other Ambulatory Visit: Payer: Self-pay

## 2018-04-02 ENCOUNTER — Ambulatory Visit (INDEPENDENT_AMBULATORY_CARE_PROVIDER_SITE_OTHER): Payer: Medicare Other | Admitting: Family Medicine

## 2018-04-02 ENCOUNTER — Encounter: Payer: Self-pay | Admitting: Family Medicine

## 2018-04-02 VITALS — BP 135/88 | HR 83 | Temp 97.9°F | Resp 18 | Ht 61.0 in | Wt 166.0 lb

## 2018-04-02 DIAGNOSIS — E559 Vitamin D deficiency, unspecified: Secondary | ICD-10-CM

## 2018-04-02 DIAGNOSIS — R5383 Other fatigue: Secondary | ICD-10-CM | POA: Diagnosis not present

## 2018-04-02 DIAGNOSIS — J9611 Chronic respiratory failure with hypoxia: Secondary | ICD-10-CM

## 2018-04-02 DIAGNOSIS — Z5111 Encounter for antineoplastic chemotherapy: Secondary | ICD-10-CM | POA: Diagnosis not present

## 2018-04-02 DIAGNOSIS — E538 Deficiency of other specified B group vitamins: Secondary | ICD-10-CM

## 2018-04-02 LAB — MAGNESIUM: MAGNESIUM: 1.6 mg/dL (ref 1.5–2.5)

## 2018-04-02 LAB — TSH: TSH: 7.23 u[IU]/mL — ABNORMAL HIGH (ref 0.35–4.50)

## 2018-04-02 LAB — VITAMIN D 25 HYDROXY (VIT D DEFICIENCY, FRACTURES): VITD: 43.22 ng/mL (ref 30.00–100.00)

## 2018-04-02 LAB — VITAMIN B12: Vitamin B-12: 1256 pg/mL — ABNORMAL HIGH (ref 211–911)

## 2018-04-02 NOTE — Progress Notes (Signed)
   Subjective:    Patient ID: Lynn Ramirez, female    DOB: 07-May-1941, 77 y.o.   MRN: [id number]  [facility] f/u- pt was admitted 9/14-18 w/ acute on chronic hypoxic respiratory failuer secondary to COPD and PNA.  D/c summary recommends repeating magnesium level today.  Pt followed up w/ pulmonary yesterday to clarify her inhalers.  Completed course of Levaquin.  Pt reports feeling better than when she was in the hospital but 'very very tired'.  Continues to have wet cough- 'not a whole lot' better.  Was given Tessalon yesterday and was told to take the codeine syrup as needed.  The wheeze is much better. Pt notes elevated CO2 levels when she was hospitalized and was told to have her thyroid checked.  Reviewed d/c summary, labs, ECHO, imaging, notes, and reconciled meds w/ pt  Review of Systems For ROS see HPI     Objective:   Physical Exam  Constitutional: She is oriented to person, place, and time. She appears well-developed and well-nourished. No distress.  HENT:  Head: Normocephalic and atraumatic.  O2 in place via [place]  Cardiovascular: Normal rate and regular rhythm.  Pulmonary/Chest: No respiratory distress. She has wheezes (end expiratory wheezes diffusely w/ prolonged expiration).  Wet, hacking cough  Neurological: She is alert and oriented to person, place, and time. No cranial nerve deficit. Coordination normal.  Skin: Skin is warm and dry.  Psychiatric: She has a normal mood and affect. Her behavior is normal. Thought content normal.  Vitals reviewed.         Assessment & Plan:  Chronic respiratory failure- pt was recently admitted w/ COPD exacerbation and PNA.  She had pulmonary f/u yesterday and has already completed her course of Levaquin.  Her wheezing is better, cough is wet but persistent.  Her only complaint today is fatigue- but she has multiple reasons for this (recent hospitalization, COPD, chemo, hx of Vit D/B12 deficiency).  Will check labs to r/o vitamin  deficiencies or thyroid abnormality.  Will not get CMP or CBC as pt will get those tomorrow prior to chemo.  No med changes at this time.  Will continue to follow.

## 2018-04-02 NOTE — Patient Instructions (Signed)
Follow up as needed or as scheduled We'll notify you of your lab results and make any changes if needed Continue to watch your sodium intake- goal is 2000mg  Continue your pulmonary medications as directed yesteday Call with any questions or concerns HAPPY BIRTHDAY!!!!

## 2018-04-03 ENCOUNTER — Other Ambulatory Visit: Payer: Self-pay | Admitting: General Practice

## 2018-04-03 ENCOUNTER — Inpatient Hospital Stay: Payer: Medicare Other

## 2018-04-03 ENCOUNTER — Encounter: Payer: Self-pay | Admitting: Nurse Practitioner

## 2018-04-03 ENCOUNTER — Inpatient Hospital Stay (HOSPITAL_BASED_OUTPATIENT_CLINIC_OR_DEPARTMENT_OTHER): Payer: Medicare Other | Admitting: Nurse Practitioner

## 2018-04-03 ENCOUNTER — Telehealth: Payer: Self-pay | Admitting: Nurse Practitioner

## 2018-04-03 VITALS — BP 120/91 | HR 97 | Temp 98.7°F | Resp 18 | Ht 61.0 in | Wt 167.1 lb

## 2018-04-03 VITALS — BP 130/48 | HR 85

## 2018-04-03 DIAGNOSIS — R05 Cough: Secondary | ICD-10-CM | POA: Diagnosis not present

## 2018-04-03 DIAGNOSIS — C569 Malignant neoplasm of unspecified ovary: Secondary | ICD-10-CM

## 2018-04-03 DIAGNOSIS — C482 Malignant neoplasm of peritoneum, unspecified: Secondary | ICD-10-CM

## 2018-04-03 DIAGNOSIS — C561 Malignant neoplasm of right ovary: Secondary | ICD-10-CM | POA: Diagnosis not present

## 2018-04-03 DIAGNOSIS — Z5111 Encounter for antineoplastic chemotherapy: Secondary | ICD-10-CM | POA: Diagnosis not present

## 2018-04-03 DIAGNOSIS — C5700 Malignant neoplasm of unspecified fallopian tube: Secondary | ICD-10-CM

## 2018-04-03 DIAGNOSIS — Z23 Encounter for immunization: Secondary | ICD-10-CM | POA: Diagnosis not present

## 2018-04-03 DIAGNOSIS — E039 Hypothyroidism, unspecified: Secondary | ICD-10-CM

## 2018-04-03 DIAGNOSIS — J90 Pleural effusion, not elsewhere classified: Secondary | ICD-10-CM | POA: Diagnosis not present

## 2018-04-03 DIAGNOSIS — J449 Chronic obstructive pulmonary disease, unspecified: Secondary | ICD-10-CM | POA: Diagnosis not present

## 2018-04-03 DIAGNOSIS — R188 Other ascites: Secondary | ICD-10-CM | POA: Diagnosis not present

## 2018-04-03 LAB — CBC WITH DIFFERENTIAL (CANCER CENTER ONLY)
BASOS PCT: 0 %
Basophils Absolute: 0 10*3/uL (ref 0.0–0.1)
EOS ABS: 0.1 10*3/uL (ref 0.0–0.5)
Eosinophils Relative: 1 %
HCT: 33.7 % — ABNORMAL LOW (ref 34.8–46.6)
HEMOGLOBIN: 10.4 g/dL — AB (ref 11.6–15.9)
Lymphocytes Relative: 10 %
Lymphs Abs: 1 10*3/uL (ref 0.9–3.3)
MCH: 31.9 pg (ref 25.1–34.0)
MCHC: 30.9 g/dL — ABNORMAL LOW (ref 31.5–36.0)
MCV: 103.4 fL — ABNORMAL HIGH (ref 79.5–101.0)
Monocytes Absolute: 1 10*3/uL — ABNORMAL HIGH (ref 0.1–0.9)
Monocytes Relative: 10 %
NEUTROS PCT: 79 %
Neutro Abs: 7.8 10*3/uL — ABNORMAL HIGH (ref 1.5–6.5)
Platelet Count: 287 10*3/uL (ref 145–400)
RBC: 3.26 MIL/uL — AB (ref 3.70–5.45)
RDW: 21.2 % — ABNORMAL HIGH (ref 11.2–14.5)
WBC: 9.9 10*3/uL (ref 3.9–10.3)

## 2018-04-03 LAB — CMP (CANCER CENTER ONLY)
ALBUMIN: 3.1 g/dL — AB (ref 3.5–5.0)
ALK PHOS: 80 U/L (ref 38–126)
ALT: 21 U/L (ref 0–44)
ANION GAP: 9 (ref 5–15)
AST: 16 U/L (ref 15–41)
BUN: 20 mg/dL (ref 8–23)
CALCIUM: 9.1 mg/dL (ref 8.9–10.3)
CO2: 38 mmol/L — AB (ref 22–32)
CREATININE: 0.82 mg/dL (ref 0.44–1.00)
Chloride: 94 mmol/L — ABNORMAL LOW (ref 98–111)
GFR, Est AFR Am: >60 mL/min (ref >60)
GFR, Estimated: >60 mL/min (ref >60)
GLUCOSE: 108 mg/dL — AB (ref 70–99)
Potassium: 3.7 mmol/L (ref 3.5–5.1)
SODIUM: 141 mmol/L (ref 135–145)
Total Bilirubin: 0.5 mg/dL (ref 0.3–1.2)
Total Protein: 6.3 g/dL — ABNORMAL LOW (ref 6.5–8.1)

## 2018-04-03 MED ORDER — SODIUM CHLORIDE 0.9% FLUSH
3.0000 mL | INTRAVENOUS | Status: DC | PRN
  Filled 2018-04-03: qty 10

## 2018-04-03 MED ORDER — SODIUM CHLORIDE 0.9 % IV SOLN
640.0000 mg/m2 | Freq: Once | INTRAVENOUS | Status: AC
  Administered 2018-04-03: 1178 mg via INTRAVENOUS
  Filled 2018-04-03: qty 31

## 2018-04-03 MED ORDER — SODIUM CHLORIDE 0.9 % IV SOLN
Freq: Once | INTRAVENOUS | Status: AC
  Administered 2018-04-03: 15:00:00 via INTRAVENOUS
  Filled 2018-04-03: qty 250

## 2018-04-03 MED ORDER — PROCHLORPERAZINE MALEATE 10 MG PO TABS
ORAL_TABLET | ORAL | Status: AC
  Filled 2018-04-03: qty 1

## 2018-04-03 MED ORDER — SODIUM CHLORIDE 0.9% FLUSH
10.0000 mL | INTRAVENOUS | Status: DC | PRN
  Administered 2018-04-03: 10 mL
  Filled 2018-04-03: qty 10

## 2018-04-03 MED ORDER — LEVOTHYROXINE SODIUM 50 MCG PO TABS: 50.0000 ug | ORAL_TABLET | Freq: Every day | ORAL | 3 refills | Status: AC

## 2018-04-03 MED ORDER — HEPARIN SOD (PORK) LOCK FLUSH 100 UNIT/ML IV SOLN
500.0000 [IU] | Freq: Once | INTRAVENOUS | Status: AC | PRN
  Administered 2018-04-03: 500 [IU]
  Filled 2018-04-03: qty 5

## 2018-04-03 MED ORDER — PROCHLORPERAZINE MALEATE 10 MG PO TABS
10.0000 mg | ORAL_TABLET | Freq: Once | ORAL | Status: AC
  Administered 2018-04-03: 10 mg via ORAL

## 2018-04-03 NOTE — Telephone Encounter (Signed)
Gave pt avs and calendar  °

## 2018-04-03 NOTE — Patient Instructions (Signed)

## 2018-04-03 NOTE — Patient Instructions (Addendum)
Cancer Center Discharge Instructions for Patients Receiving Chemotherapy  Today you received the following chemotherapy agents gemzar.  To help prevent nausea and vomiting after your treatment, we encourage you to take your nausea medication as directed.   If you develop nausea and vomiting that is not controlled by your nausea medication, call the clinic.   BELOW ARE SYMPTOMS THAT SHOULD BE REPORTED IMMEDIATELY:  *FEVER GREATER THAN 100.5 F  *CHILLS WITH OR WITHOUT FEVER  NAUSEA AND VOMITING THAT IS NOT CONTROLLED WITH YOUR NAUSEA MEDICATION  *UNUSUAL SHORTNESS OF BREATH  *UNUSUAL BRUISING OR BLEEDING  TENDERNESS IN MOUTH AND THROAT WITH OR WITHOUT PRESENCE OF ULCERS  *URINARY PROBLEMS  *BOWEL PROBLEMS  UNUSUAL RASH Items with * indicate a potential emergency and should be followed up as soon as possible.  Feel free to call the clinic should you have any questions or concerns. The clinic phone number is [phone].  Please show the CHEMO ALERT CARD at check-in to the Emergency Department and triage nurse.   

## 2018-04-03 NOTE — Progress Notes (Signed)
[place] OFFICE PROGRESS NOTE   Diagnosis:  Fallopian tube carcinoma  INTERVAL HISTORY:   Lynn Ramirez returns as scheduled.  She completed cycle 4-day 1 gemcitabine 03/13/2018.  Gemcitabine was held on 03/20/2018 and she was referred for a paracentesis and chest x-ray.  Cytology on the ascites showed rare atypical cells.  Chest x-ray showed COPD with no active disease.  She was hospitalized 03/23/2018 through 03/27/2018 with COPD exacerbation.  She reports her breathing is significantly better.  She no longer notes wheezing.  She has a persistent "loose" cough.  The cough is essentially nonproductive.  She occasionally expectorates clear phlegm.  Main complaint is fatigue.  No fever.  No nausea or vomiting.  No rash.  No fever.  She notes marked improvement in abdominal distention following the paracentesis procedure.  Objective:  Vital signs in last 24 hours:  Blood pressure (!) 120/91, pulse 97, temperature 98.7 F (37.1 C), temperature source Oral, resp. rate 18, height _0  (1.549 m), weight 167 lb 1.6 oz (75.8 kg), SpO2 95 %.    HEENT: No thrush or ulcers. Resp: Distant breath sounds with scattered expiratory rhonchi.  No respiratory distress. Cardio: Regular rate and rhythm. GI: Abdomen soft and nontender.  No hepatomegaly. Vascular: No leg edema. Neuro: Alert and oriented.  Port-A-Cath without erythema.  Lab Results:  Lab Results  Component Value Date   WBC 9.9 04/03/2018   HGB 10.4 (L) 04/03/2018   HCT 33.7 (L) 04/03/2018   MCV 103.4 (H) 04/03/2018   PLT 287 04/03/2018   NEUTROABS 7.8 (H) 04/03/2018    Imaging:  No results found.  Medications: I have reviewed the patient's current medications.  Assessment/Plan: 1. 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 Significant improvement in malignant right pleural effusion.  Status post robotic-assisted laparoscopic hysterectomy with bilateral salpingoophorectomy, omentectomy, radical tumor debulking 12/29/2014. Per Dr. Serita Grit office note 01/14/2015 cytoreduction was optimal with residual disease remaining only in the 1 mm implants on the small intestine. 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  MSI-stable, mutation burden-4, BRAF rearrangement,ERBB4, KRAS G12D Cycle 1 adjuvant Taxol/carboplatin 01/20/2015 Cycle 2 adjuvant Taxol/carboplatin 02/10/2015 Cycle 3 adjuvant Taxol/carboplatin 03/10/2015 CA 125 on 1013 2016-42  CTs of the chest, abdomen, and pelvis 05/31/2015 with no evidence of progressive ovarian cancer  CTs of the chest, abdomen, and pelvis 08/07/2015 and 08/08/2015-no evidence of progressive ovarian cancer  Peritoneal studding noted at the time of the cholecystectomy procedure  08/09/2015 Elevated CA 125 10/07/2015  CT 10/07/2015 with stricturing at the sigmoid colon, constipation, and omental nodularity  Initiation of salvage weekly Taxol chemotherapy 10/13/2015  Taxol changed to every 2 weeks beginning 01/19/2016 due to painful neuropathy.  CT scans 07/19/2016 with no acute findings. No  features in the abdomen or pelvis to suggest recurrent disease.Stable mild fullness right intrarenal collecting system.  Elevated CA 125 treatment resumed with Taxol/Avastin on a 2 week schedule 09/27/2016  CT 02/12/2017-no evidence of carcinomatosis, no evidence of progressive metastatic disease  Taxol/Avastin continued every 2 weeks  CT 09/10/2017-increase in trace ascites, no evidence of progressive carcinomatosis, inflammatory changes at the lung bases with a new 3 mm right lower lobe nodule  Taxol/Avastin continued every 2 weeks  Progressive rise in the CA 125  Cycle 1 carboplatin 3-week schedule4/17/2019  Cycle 2 carboplatin 11/14/2017  Progressive malignant left pleural effusion June 2019  CT 01/07/2018-resolution of pulmonary nodule seen on prior examination. New small bilateral pleural effusions left greater than right with areas of atelectasis. Stable scattered mediastinal and hilar lymph nodes some partially calcified. Stable emphysematous changes and areas of pulmonary scarring. Small volume abdominal ascites but no omental or peritoneal surface disease. Small scattered mesenteric and retroperitoneal lymph nodes stable. Stable nodularity left adrenal gland.  Cycle 1 gemcitabine 01/09/2018 (day 1/day 8 schedule)  Cycle 2 gemcitabine 01/30/2018  Cycle 3 gemcitabine 02/21/2018  CT 03/03/2018-moderate free fluid in the abdomen and pelvis, thickened walls of small bowel loops in the right lower quadrant  Cycle 4 gemcitabine 03/13/2018  CT 03/03/2018-small bilateral pleural effusions, moderate ascites, small bowel wall thickening in the right lower  quadrant  Paracentesis 03/20/2018- negative cytology  Cycle 5 day 1 gemcitabine 04/03/2018 2. COPD-followed by Dr. Lamonte Sakai. 3. Dyspnea secondary to COPD and the large right pleural effusion, status post therapeutic thoracentesis procedures 09/30/2014,10/09/2014, and 10/21/2014. Left thoracentesis 10/30/2014  Progressive left pleural effusion noted on chest x-ray 12/17/2017  Left thoracentesis 12/19/2017, cytology positive for metastatic adenocarcinoma 4. Left nephrectomy as a child 5. Delayed nausea following cycle 1 Taxol/carboplatin, Aloxi/Emend added with cycle 2 with improvement. 6. Right lower extremity edema, right calf pain 12/09/2014. Negative venous Doppler 12/09/2014. 7. Diffuse pruritus following cycle 1 adjuvant Taxol/carboplatin 01/20/2015, no rash, resolved with a steroid dose pack 8. Thrombocytopenia second to chemotherapy, the carboplatin was dose reduced with cycle 2 adjuvant Taxol/carboplatin 02/10/2015 9. Chemotherapy-induced peripheral neuropathy-painful peripheral neuropathy involving the feet 01/19/2016.  10. Admission with acute cholecystitis 08/07/2015, status post a cholecystectomy 08/09/2015 11. Admission 10/08/2015 with abdominal pain/constipation, improved with bowel rest and laxatives 12. Mild right hydronephrosis noted on the CT 10/07/2015. Renal ultrasound 12/16/2015 with no hydronephrosis noted.No hydronephrosis on CT 07/19/2016. 13. Anemia secondary to chronic disease and chemotherapy, status post a red cell transfusion 12/05/2017 14. Admission 02/22/2018 with increased dyspnea 15. Admission 03/23/2018 with a COPD flare   Disposition: Lynn Ramirez appears unchanged.  She is slowly recovering from the recent hospitalization with a COPD exacerbation.  Per Dr. Ashok Cordia hospital progress note 03/27/2018 the plan is to resume gemcitabine today.  She appears stable to resume treatment and is in agreement to do so.  She will complete cycle 5-day 1 gemcitabine today as  scheduled.  She will return in 1 week for the day 8 treatment.  She will return for lab, follow-up and cycle 6-day 1 gemcitabine on 04/24/2018.  She will contact the office in the interim with any problems.    Ned Card ANP/GNP-BC   04/03/2018  2:10 PM

## 2018-04-04 ENCOUNTER — Telehealth: Payer: Self-pay | Admitting: Medical Oncology

## 2018-04-04 LAB — CA 125: CANCER ANTIGEN (CA) 125: 200 U/mL — AB (ref 0.0–38.1)

## 2018-04-04 NOTE — Telephone Encounter (Signed)
Pt asking for ca-125 results.

## 2018-04-05 NOTE — Telephone Encounter (Signed)
Left message to advise results.

## 2018-04-07 ENCOUNTER — Other Ambulatory Visit: Payer: Self-pay | Admitting: Oncology

## 2018-04-10 ENCOUNTER — Inpatient Hospital Stay: Payer: Medicare Other

## 2018-04-10 ENCOUNTER — Inpatient Hospital Stay: Payer: Medicare Other | Attending: Nurse Practitioner

## 2018-04-10 VITALS — BP 149/68 | HR 94 | Temp 97.7°F | Resp 20

## 2018-04-10 DIAGNOSIS — C561 Malignant neoplasm of right ovary: Secondary | ICD-10-CM | POA: Diagnosis not present

## 2018-04-10 DIAGNOSIS — Z79899 Other long term (current) drug therapy: Secondary | ICD-10-CM | POA: Diagnosis not present

## 2018-04-10 DIAGNOSIS — J910 Malignant pleural effusion: Secondary | ICD-10-CM | POA: Diagnosis not present

## 2018-04-10 DIAGNOSIS — C786 Secondary malignant neoplasm of retroperitoneum and peritoneum: Secondary | ICD-10-CM | POA: Diagnosis not present

## 2018-04-10 DIAGNOSIS — C569 Malignant neoplasm of unspecified ovary: Secondary | ICD-10-CM

## 2018-04-10 DIAGNOSIS — C5700 Malignant neoplasm of unspecified fallopian tube: Secondary | ICD-10-CM

## 2018-04-10 DIAGNOSIS — C482 Malignant neoplasm of peritoneum, unspecified: Secondary | ICD-10-CM

## 2018-04-10 DIAGNOSIS — Z5111 Encounter for antineoplastic chemotherapy: Secondary | ICD-10-CM | POA: Insufficient documentation

## 2018-04-10 DIAGNOSIS — C562 Malignant neoplasm of left ovary: Secondary | ICD-10-CM | POA: Insufficient documentation

## 2018-04-10 DIAGNOSIS — R188 Other ascites: Secondary | ICD-10-CM | POA: Diagnosis not present

## 2018-04-10 LAB — CBC WITH DIFFERENTIAL (CANCER CENTER ONLY)
Basophils Absolute: 0 10*3/uL (ref 0.0–0.1)
Basophils Relative: 0 %
EOS PCT: 1 %
Eosinophils Absolute: 0 10*3/uL (ref 0.0–0.5)
HCT: 29.9 % — ABNORMAL LOW (ref 34.8–46.6)
Hemoglobin: 9.1 g/dL — ABNORMAL LOW (ref 11.6–15.9)
LYMPHS PCT: 22 %
Lymphs Abs: 0.9 10*3/uL (ref 0.9–3.3)
MCH: 31.6 pg (ref 25.1–34.0)
MCHC: 30.4 g/dL — ABNORMAL LOW (ref 31.5–36.0)
MCV: 103.8 fL — AB (ref 79.5–101.0)
Monocytes Absolute: 0.3 10*3/uL (ref 0.1–0.9)
Monocytes Relative: 8 %
Neutro Abs: 2.8 10*3/uL (ref 1.5–6.5)
Neutrophils Relative %: 69 %
PLATELETS: 151 10*3/uL (ref 145–400)
RBC: 2.88 MIL/uL — ABNORMAL LOW (ref 3.70–5.45)
RDW: 20.6 % — ABNORMAL HIGH (ref 11.2–14.5)
WBC Count: 4 10*3/uL (ref 3.9–10.3)

## 2018-04-10 MED ORDER — PROCHLORPERAZINE MALEATE 10 MG PO TABS
ORAL_TABLET | ORAL | Status: AC
  Filled 2018-04-10: qty 1

## 2018-04-10 MED ORDER — HEPARIN SOD (PORK) LOCK FLUSH 100 UNIT/ML IV SOLN
500.0000 [IU] | Freq: Once | INTRAVENOUS | Status: AC | PRN
  Administered 2018-04-10: 500 [IU]
  Filled 2018-04-10: qty 5

## 2018-04-10 MED ORDER — SODIUM CHLORIDE 0.9 % IV SOLN
640.0000 mg/m2 | Freq: Once | INTRAVENOUS | Status: AC
  Administered 2018-04-10: 1178 mg via INTRAVENOUS
  Filled 2018-04-10: qty 30.98

## 2018-04-10 MED ORDER — PROCHLORPERAZINE MALEATE 10 MG PO TABS
10.0000 mg | ORAL_TABLET | Freq: Once | ORAL | Status: AC
  Administered 2018-04-10: 10 mg via ORAL

## 2018-04-10 MED ORDER — SODIUM CHLORIDE 0.9% FLUSH
10.0000 mL | INTRAVENOUS | Status: DC | PRN
  Administered 2018-04-10: 10 mL
  Filled 2018-04-10: qty 10

## 2018-04-10 MED ORDER — SODIUM CHLORIDE 0.9 % IV SOLN
Freq: Once | INTRAVENOUS | Status: AC
  Administered 2018-04-10: 15:00:00 via INTRAVENOUS
  Filled 2018-04-10: qty 250

## 2018-04-10 NOTE — Patient Instructions (Signed)
Implanted Port Home Guide An implanted port is a type of central line that is placed under the skin. Central lines are used to provide IV access when treatment or nutrition needs to be given through a person's veins. Implanted ports are used for long-term IV access. An implanted port may be placed because:  You need IV medicine that would be irritating to the small veins in your hands or arms.  You need long-term IV medicines, such as antibiotics.  You need IV nutrition for a long period.  You need frequent blood draws for lab tests.  You need dialysis.  Implanted ports are usually placed in the chest area, but they can also be placed in the upper arm, the abdomen, or the leg. An implanted port has two main parts:  Reservoir. The reservoir is round and will appear as a small, raised area under your skin. The reservoir is the part where a needle is inserted to give medicines or draw blood.  Catheter. The catheter is a thin, flexible tube that extends from the reservoir. The catheter is placed into a large vein. Medicine that is inserted into the reservoir goes into the catheter and then into the vein.  How will I care for my incision site? Do not get the incision site wet. Bathe or shower as directed by your health care provider. How is my port accessed? Special steps must be taken to access the port:  Before the port is accessed, a numbing cream can be placed on the skin. This helps numb the skin over the port site.  Your health care provider uses a sterile technique to access the port. ? Your health care provider must put on a mask and sterile gloves. ? The skin over your port is cleaned carefully with an antiseptic and allowed to dry. ? The port is gently pinched between sterile gloves, and a needle is inserted into the port.  Only "non-coring" port needles should be used to access the port. Once the port is accessed, a blood return should be checked. This helps ensure that the port  is in the vein and is not clogged.  If your port needs to remain accessed for a constant infusion, a clear (transparent) bandage will be placed over the needle site. The bandage and needle will need to be changed every week, or as directed by your health care provider.  Keep the bandage covering the needle clean and dry. Do not get it wet. Follow your health care provider's instructions on how to take a shower or bath while the port is accessed.  If your port does not need to stay accessed, no bandage is needed over the port.  What is flushing? Flushing helps keep the port from getting clogged. Follow your health care provider's instructions on how and when to flush the port. Ports are usually flushed with saline solution or a medicine called heparin. The need for flushing will depend on how the port is used.  If the port is used for intermittent medicines or blood draws, the port will need to be flushed: ? After medicines have been given. ? After blood has been drawn. ? As part of routine maintenance.  If a constant infusion is running, the port may not need to be flushed.  How long will my port stay implanted? The port can stay in for as long as your health care provider thinks it is needed. When it is time for the port to come out, surgery will be   done to remove it. The procedure is similar to the one performed when the port was put in. When should I seek immediate medical care? When you have an implanted port, you should seek immediate medical care if:  You notice a bad smell coming from the incision site.  You have swelling, redness, or drainage at the incision site.  You have more swelling or pain at the port site or the surrounding area.  You have a fever that is not controlled with medicine.  This information is not intended to replace advice given to you by your health care provider. Make sure you discuss any questions you have with your health care provider. Document  Released: 06/26/2005 Document Revised: 12/02/2015 Document Reviewed: 03/03/2013 Elsevier Interactive Patient Education  2017 Elsevier Inc.  

## 2018-04-10 NOTE — Patient Instructions (Signed)
[facility] Discharge Instructions for Patients Receiving Chemotherapy  Today you received the following chemotherapy agents: Gemzar (gemcitabine)  To help prevent nausea and vomiting after your treatment, we encourage you to take your nausea medication as directed.   If you develop nausea and vomiting that is not controlled by your nausea medication, call the clinic.   BELOW ARE SYMPTOMS THAT SHOULD BE REPORTED IMMEDIATELY:  *FEVER GREATER THAN 100.5 F  *CHILLS WITH OR WITHOUT FEVER  NAUSEA AND VOMITING THAT IS NOT CONTROLLED WITH YOUR NAUSEA MEDICATION  *UNUSUAL SHORTNESS OF BREATH  *UNUSUAL BRUISING OR BLEEDING  TENDERNESS IN MOUTH AND THROAT WITH OR WITHOUT PRESENCE OF ULCERS  *URINARY PROBLEMS  *BOWEL PROBLEMS  UNUSUAL RASH Items with * indicate a potential emergency and should be followed up as soon as possible.  Feel free to call the clinic should you have any questions or concerns. The clinic phone number is [phone].  Please show the CHEMO ALERT CARD at check-in to the Emergency Department and triage nurse.   

## 2018-04-10 NOTE — Progress Notes (Signed)
Per Ned Card NP, Dr. Benay Spice advises ok to treat without CMP.

## 2018-04-21 ENCOUNTER — Other Ambulatory Visit: Payer: Self-pay | Admitting: Oncology

## 2018-04-24 ENCOUNTER — Telehealth: Payer: Self-pay | Admitting: Oncology

## 2018-04-24 ENCOUNTER — Inpatient Hospital Stay: Payer: Medicare Other

## 2018-04-24 ENCOUNTER — Ambulatory Visit (HOSPITAL_COMMUNITY)
Admission: RE | Admit: 2018-04-24 | Discharge: 2018-04-24 | Disposition: A | Discharge status: Home / Self Care | Payer: Medicare Other | Source: Ambulatory Visit | Attending: Oncology | Admitting: Oncology

## 2018-04-24 ENCOUNTER — Inpatient Hospital Stay (HOSPITAL_BASED_OUTPATIENT_CLINIC_OR_DEPARTMENT_OTHER): Payer: Medicare Other | Admitting: Oncology

## 2018-04-24 VITALS — BP 121/73 | HR 85 | Temp 98.1°F | Resp 17 | Ht 61.0 in | Wt 174.9 lb

## 2018-04-24 DIAGNOSIS — C5700 Malignant neoplasm of unspecified fallopian tube: Secondary | ICD-10-CM | POA: Diagnosis not present

## 2018-04-24 DIAGNOSIS — J910 Malignant pleural effusion: Secondary | ICD-10-CM | POA: Diagnosis not present

## 2018-04-24 DIAGNOSIS — Z79899 Other long term (current) drug therapy: Secondary | ICD-10-CM | POA: Diagnosis not present

## 2018-04-24 DIAGNOSIS — C786 Secondary malignant neoplasm of retroperitoneum and peritoneum: Secondary | ICD-10-CM

## 2018-04-24 DIAGNOSIS — C562 Malignant neoplasm of left ovary: Secondary | ICD-10-CM | POA: Diagnosis not present

## 2018-04-24 DIAGNOSIS — R188 Other ascites: Secondary | ICD-10-CM | POA: Insufficient documentation

## 2018-04-24 DIAGNOSIS — Z5111 Encounter for antineoplastic chemotherapy: Secondary | ICD-10-CM | POA: Diagnosis not present

## 2018-04-24 DIAGNOSIS — C569 Malignant neoplasm of unspecified ovary: Secondary | ICD-10-CM

## 2018-04-24 DIAGNOSIS — C561 Malignant neoplasm of right ovary: Secondary | ICD-10-CM

## 2018-04-24 DIAGNOSIS — C482 Malignant neoplasm of peritoneum, unspecified: Secondary | ICD-10-CM | POA: Diagnosis not present

## 2018-04-24 DIAGNOSIS — Z95828 Presence of other vascular implants and grafts: Secondary | ICD-10-CM

## 2018-04-24 DIAGNOSIS — R180 Malignant ascites: Secondary | ICD-10-CM | POA: Diagnosis not present

## 2018-04-24 LAB — CBC WITH DIFFERENTIAL (CANCER CENTER ONLY)
Abs Immature Granulocytes: 0.01 10*3/uL (ref 0.00–0.07)
BASOS ABS: 0 10*3/uL (ref 0.0–0.1)
Basophils Relative: 1 %
Eosinophils Absolute: 0.1 10*3/uL (ref 0.0–0.5)
Eosinophils Relative: 2 %
HEMATOCRIT: 28.8 % — AB (ref 36.0–46.0)
HEMOGLOBIN: 8.8 g/dL — AB (ref 12.0–15.0)
IMMATURE GRANULOCYTES: 0 %
LYMPHS ABS: 0.4 10*3/uL — AB (ref 0.7–4.0)
LYMPHS PCT: 11 %
MCH: 31.9 pg (ref 26.0–34.0)
MCHC: 30.6 g/dL (ref 30.0–36.0)
MCV: 104.3 fL — ABNORMAL HIGH (ref 80.0–100.0)
Monocytes Absolute: 0.7 10*3/uL (ref 0.1–1.0)
Monocytes Relative: 18 %
NEUTROS PCT: 68 %
NRBC: 0 % (ref 0.0–0.2)
Neutro Abs: 2.8 10*3/uL (ref 1.7–7.7)
Platelet Count: 311 10*3/uL (ref 150–400)
RBC: 2.76 MIL/uL — ABNORMAL LOW (ref 3.87–5.11)
RDW: 19.7 % — ABNORMAL HIGH (ref 11.5–15.5)
WBC Count: 4 10*3/uL (ref 4.0–10.5)

## 2018-04-24 LAB — CMP (CANCER CENTER ONLY)
ALT: 8 U/L (ref 0–44)
ANION GAP: 10 (ref 5–15)
AST: 15 U/L (ref 15–41)
Albumin: 2.6 g/dL — ABNORMAL LOW (ref 3.5–5.0)
Alkaline Phosphatase: 71 U/L (ref 38–126)
BUN: 14 mg/dL (ref 8–23)
CHLORIDE: 96 mmol/L — AB (ref 98–111)
CO2: 37 mmol/L — AB (ref 22–32)
CREATININE: 0.82 mg/dL (ref 0.44–1.00)
Calcium: 9 mg/dL (ref 8.9–10.3)
GFR, Estimated: >60 mL/min (ref >60)
Glucose, Bld: 118 mg/dL — ABNORMAL HIGH (ref 70–99)
Potassium: 3.5 mmol/L (ref 3.5–5.1)
SODIUM: 143 mmol/L (ref 135–145)
Total Bilirubin: 0.6 mg/dL (ref 0.3–1.2)
Total Protein: 6.4 g/dL — ABNORMAL LOW (ref 6.5–8.1)

## 2018-04-24 MED ORDER — SODIUM CHLORIDE 0.9 % IJ SOLN
10.0000 mL | INTRAMUSCULAR | Status: DC | PRN
  Administered 2018-04-24: 10 mL via INTRAVENOUS
  Filled 2018-04-24: qty 10

## 2018-04-24 MED ORDER — PROCHLORPERAZINE MALEATE 10 MG PO TABS
ORAL_TABLET | ORAL | Status: AC
  Filled 2018-04-24: qty 1

## 2018-04-24 MED ORDER — SODIUM CHLORIDE 0.9 % IV SOLN
640.0000 mg/m2 | Freq: Once | INTRAVENOUS | Status: AC
  Administered 2018-04-24: 1178 mg via INTRAVENOUS
  Filled 2018-04-24: qty 30.98

## 2018-04-24 MED ORDER — SODIUM CHLORIDE 0.9 % IV SOLN
Freq: Once | INTRAVENOUS | Status: AC
  Administered 2018-04-24: 11:00:00 via INTRAVENOUS
  Filled 2018-04-24: qty 250

## 2018-04-24 MED ORDER — SODIUM CHLORIDE 0.9% FLUSH
10.0000 mL | INTRAVENOUS | Status: DC | PRN
  Administered 2018-04-24: 10 mL
  Filled 2018-04-24: qty 10

## 2018-04-24 MED ORDER — LIDOCAINE HCL 1 % IJ SOLN
INTRAMUSCULAR | Status: AC
  Filled 2018-04-24: qty 20

## 2018-04-24 MED ORDER — HEPARIN SOD (PORK) LOCK FLUSH 100 UNIT/ML IV SOLN
500.0000 [IU] | Freq: Once | INTRAVENOUS | Status: AC | PRN
  Administered 2018-04-24: 500 [IU]
  Filled 2018-04-24: qty 5

## 2018-04-24 MED ORDER — PROCHLORPERAZINE MALEATE 10 MG PO TABS
10.0000 mg | ORAL_TABLET | Freq: Once | ORAL | Status: AC
  Administered 2018-04-24: 10 mg via ORAL

## 2018-04-24 NOTE — Patient Instructions (Signed)
[facility] Discharge Instructions for Patients Receiving Chemotherapy  Today you received the following chemotherapy agents: Gemzar (gemcitabine)  To help prevent nausea and vomiting after your treatment, we encourage you to take your nausea medication as directed.   If you develop nausea and vomiting that is not controlled by your nausea medication, call the clinic.   BELOW ARE SYMPTOMS THAT SHOULD BE REPORTED IMMEDIATELY:  *FEVER GREATER THAN 100.5 F  *CHILLS WITH OR WITHOUT FEVER  NAUSEA AND VOMITING THAT IS NOT CONTROLLED WITH YOUR NAUSEA MEDICATION  *UNUSUAL SHORTNESS OF BREATH  *UNUSUAL BRUISING OR BLEEDING  TENDERNESS IN MOUTH AND THROAT WITH OR WITHOUT PRESENCE OF ULCERS  *URINARY PROBLEMS  *BOWEL PROBLEMS  UNUSUAL RASH Items with * indicate a potential emergency and should be followed up as soon as possible.  Feel free to call the clinic should you have any questions or concerns. The clinic phone number is [phone].  Please show the CHEMO ALERT CARD at check-in to the Emergency Department and triage nurse.   

## 2018-04-24 NOTE — Procedures (Signed)
PROCEDURE SUMMARY:  Successful US guided paracentesis from LLQ.  Yielded 1.1 L of cloudy yellow fluid.  No immediate complications.  Pt tolerated well.   Specimen was sent for labs.  Ascencion Dike PA-C 04/24/2018 1:58 PM

## 2018-04-24 NOTE — Progress Notes (Signed)
[place] OFFICE PROGRESS NOTE   Diagnosis: Fallopian tube carcinoma  INTERVAL HISTORY:   Ms. Deyarmin returns as scheduled.  He completed another cycle of gemcitabine beginning 04/03/2018.  She continues to have a cough and dyspnea.  The dyspnea waxes and wanes.  She complains of increased abdominal distention.  She is having bowel movements.  She is taking Lasix daily.   Objective:  Vital signs in last 24 hours:  Blood pressure 121/73, pulse 85, temperature 98.1 F (36.7 C), temperature source Oral, resp. rate 17, height '5\' 1"'  (1.549 m), weight 174 lb 14.4 oz (79.3 kg), SpO2 98 %.    HEENT: No thrush or ulcers Resp: Lungs distant breath sounds, inspiratory rhonchi at the posterior base bilaterally, no respiratory distress Cardio: Regular rate and rhythm GI: Distended, diffuse mild tenderness, firmness throughout the mid and left abdomen Vascular: Face edema at the left greater than right lower leg   Portacath/PICC-without erythema  Lab Results:  Lab Results  Component Value Date   WBC 4.0 04/24/2018   HGB 8.8 (L) 04/24/2018   HCT 28.8 (L) 04/24/2018   MCV 104.3 (H) 04/24/2018   PLT 311 04/24/2018   NEUTROABS 2.8 04/24/2018    CMP  Lab Results  Component Value Date   NA 143 04/24/2018   K 3.5 04/24/2018   CL 96 (L) 04/24/2018   CO2 37 (H) 04/24/2018   GLUCOSE 118 (H) 04/24/2018   BUN 14 04/24/2018   CREATININE 0.82 04/24/2018   CALCIUM 9.0 04/24/2018   PROT 6.4 (L) 04/24/2018   ALBUMIN 2.6 (L) 04/24/2018   AST 15 04/24/2018   ALT 8 04/24/2018   ALKPHOS 71 04/24/2018   BILITOT 0.6 04/24/2018   GFRNONAA >60 04/24/2018   GFRAA >60 04/24/2018     Medications: I have reviewed the patient's current medications.   Assessment/Plan: 1. 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 Significant improvement in malignant right pleural effusion.  Status post robotic-assisted laparoscopic hysterectomy with bilateral salpingoophorectomy, omentectomy, radical tumor debulking 12/29/2014. Per Dr. Serita Grit office note 01/14/2015 cytoreduction was optimal with residual disease remaining only in the 1 mm implants on the small intestine. 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  MSI-stable, mutation burden-4, BRAF rearrangement,ERBB4, KRAS G12D Cycle 1 adjuvant Taxol/carboplatin 01/20/2015 Cycle 2 adjuvant Taxol/carboplatin 02/10/2015 Cycle 3 adjuvant Taxol/carboplatin 03/10/2015 CA 125 on 1013 2016-42  CTs of the chest, abdomen, and pelvis 05/31/2015 with no evidence of progressive ovarian cancer  CTs of the chest, abdomen, and pelvis 08/07/2015 and 08/08/2015-no evidence of progressive ovarian cancer  Peritoneal studding noted at the time of the cholecystectomy procedure  08/09/2015 Elevated CA 125 10/07/2015  CT 10/07/2015 with stricturing at the sigmoid colon, constipation, and omental nodularity  Initiation of  salvage weekly Taxol chemotherapy 10/13/2015  Taxol changed to every 2 weeks beginning 01/19/2016 due to painful neuropathy.  CT scans 07/19/2016 with no acute findings. No features in the abdomen or pelvis to suggest recurrent disease.Stable mild fullness right intrarenal collecting system.  Elevated CA 125 treatment resumed with Taxol/Avastin on a 2 week schedule 09/27/2016  CT 02/12/2017-no evidence of carcinomatosis, no evidence of progressive metastatic disease  Taxol/Avastin continued every 2 weeks  CT 09/10/2017-increase in trace ascites, no evidence of progressive carcinomatosis, inflammatory changes at the lung bases with a new 3 mm right lower lobe nodule  Taxol/Avastin continued every 2 weeks  Progressive rise in the CA 125  Cycle 1 carboplatin 3-week schedule4/17/2019  Cycle 2 carboplatin 11/14/2017  Progressive malignant left pleural effusion June 2019  CT 01/07/2018-resolution of pulmonary nodule seen on prior examination. New small bilateral pleural effusions left greater than right with areas of atelectasis. Stable scattered mediastinal and hilar lymph nodes some partially calcified. Stable emphysematous changes and areas of pulmonary scarring. Small volume abdominal ascites but no omental or peritoneal surface disease. Small scattered mesenteric and retroperitoneal lymph nodes stable. Stable nodularity left adrenal gland.  Cycle 1 gemcitabine 01/09/2018 (day 1/day 8 schedule)  Cycle 2 gemcitabine 01/30/2018  Cycle 3 gemcitabine 02/21/2018  CT 03/03/2018-moderate free fluid in the abdomen and pelvis, thickened walls of small bowel loops in the right lower quadrant  Cycle 4 gemcitabine 03/13/2018  CT 03/03/2018-small bilateral pleural effusions, moderate ascites, small bowel wall thickening in the right lower  quadrant  Paracentesis 03/20/2018- negative cytology  Cycle 5 day 1 gemcitabine 04/03/2018  Cycle 6 gemcitabine 04/24/2018 2. COPD-followed by Dr. Lamonte Sakai. 3. Dyspnea secondary to COPD and the large right pleural effusion, status post therapeutic thoracentesis procedures 09/30/2014,10/09/2014, and 10/21/2014. Left thoracentesis 10/30/2014  Progressive left pleural effusion noted on chest x-ray 12/17/2017  Left thoracentesis 12/19/2017, cytology positive for metastatic adenocarcinoma 4. Left nephrectomy as a child 5. Delayed nausea following cycle 1 Taxol/carboplatin, Aloxi/Emend added with cycle 2 with improvement. 6. Right lower extremity edema, right calf pain 12/09/2014. Negative venous Doppler 12/09/2014. 7. Diffuse pruritus following cycle 1 adjuvant Taxol/carboplatin 01/20/2015, no rash, resolved with a steroid dose pack 8. Thrombocytopenia second to chemotherapy, the carboplatin was dose reduced with cycle 2 adjuvant Taxol/carboplatin 02/10/2015 9. Chemotherapy-induced peripheral neuropathy-painful peripheral neuropathy involving the feet 01/19/2016.  10. Admission with acute cholecystitis 08/07/2015, status post a cholecystectomy 08/09/2015 11. Admission 10/08/2015 with abdominal pain/constipation, improved with bowel rest and laxatives 12. Mild right hydronephrosis noted on the CT 10/07/2015. Renal ultrasound 12/16/2015 with no hydronephrosis noted.No hydronephrosis on CT 07/19/2016. 13. Anemia secondary to chronic disease and chemotherapy, status post a red cell transfusion 12/05/2017 14. Admission 02/22/2018 with increased dyspnea 15. Admission 03/23/2018 with a COPD flare  Disposition: Her overall status appears unchanged.  She will complete another cycle of gemcitabine beginning today.  We will follow-up on the CA 125 from today.  She will undergo a diagnostic/therapeutic paracentesis today or tomorrow.  Ms. Friddle will return for an office visit in 3 weeks.  She is scheduled  for day 8 chemotherapy on 05/01/2018.  The Ca 125 has been relatively stable since beginning gemcitabine, but I am concerned she is developing progressive carcinomatosis.   25 minutes were spent with the patient today.  The majority of the time was used for counseling and coordination of care.  Betsy Coder, MD  04/24/2018  9:12 AM

## 2018-04-24 NOTE — Telephone Encounter (Signed)
Scheduled appt per 10/16 los- gave patient avs and calender per los.

## 2018-04-25 LAB — CA 125: Cancer Antigen (CA) 125: 218 U/mL — ABNORMAL HIGH (ref 0.0–38.1)

## 2018-04-27 ENCOUNTER — Other Ambulatory Visit: Payer: Self-pay | Admitting: Oncology

## 2018-05-01 ENCOUNTER — Inpatient Hospital Stay: Payer: Medicare Other

## 2018-05-01 DIAGNOSIS — C561 Malignant neoplasm of right ovary: Secondary | ICD-10-CM | POA: Diagnosis not present

## 2018-05-01 DIAGNOSIS — C562 Malignant neoplasm of left ovary: Secondary | ICD-10-CM | POA: Diagnosis not present

## 2018-05-01 DIAGNOSIS — Z5111 Encounter for antineoplastic chemotherapy: Secondary | ICD-10-CM | POA: Diagnosis not present

## 2018-05-01 DIAGNOSIS — J910 Malignant pleural effusion: Secondary | ICD-10-CM | POA: Diagnosis not present

## 2018-05-01 DIAGNOSIS — C786 Secondary malignant neoplasm of retroperitoneum and peritoneum: Secondary | ICD-10-CM | POA: Diagnosis not present

## 2018-05-01 DIAGNOSIS — R188 Other ascites: Secondary | ICD-10-CM | POA: Diagnosis not present

## 2018-05-01 DIAGNOSIS — C569 Malignant neoplasm of unspecified ovary: Secondary | ICD-10-CM

## 2018-05-01 LAB — CBC WITH DIFFERENTIAL (CANCER CENTER ONLY)
Abs Immature Granulocytes: 0.05 10*3/uL (ref 0.00–0.07)
Basophils Absolute: 0 10*3/uL (ref 0.0–0.1)
Basophils Relative: 0 %
EOS ABS: 0 10*3/uL (ref 0.0–0.5)
EOS PCT: 0 %
HEMATOCRIT: 28.3 % — AB (ref 36.0–46.0)
HEMOGLOBIN: 8.6 g/dL — AB (ref 12.0–15.0)
Immature Granulocytes: 2 %
LYMPHS ABS: 0.8 10*3/uL (ref 0.7–4.0)
LYMPHS PCT: 31 %
MCH: 31.3 pg (ref 26.0–34.0)
MCHC: 30.4 g/dL (ref 30.0–36.0)
MCV: 102.9 fL — AB (ref 80.0–100.0)
MONO ABS: 0.6 10*3/uL (ref 0.1–1.0)
Monocytes Relative: 25 %
NRBC: 1.6 % — AB (ref 0.0–0.2)
Neutro Abs: 1 10*3/uL — ABNORMAL LOW (ref 1.7–7.7)
Neutrophils Relative %: 42 %
Platelet Count: 224 10*3/uL (ref 150–400)
RBC: 2.75 MIL/uL — ABNORMAL LOW (ref 3.87–5.11)
RDW: 18.6 % — AB (ref 11.5–15.5)
WBC Count: 2.5 10*3/uL — ABNORMAL LOW (ref 4.0–10.5)

## 2018-05-01 LAB — CMP (CANCER CENTER ONLY)
ALBUMIN: 2.5 g/dL — AB (ref 3.5–5.0)
ALT: 14 U/L (ref 0–44)
ANION GAP: 10 (ref 5–15)
AST: 29 U/L (ref 15–41)
Alkaline Phosphatase: 68 U/L (ref 38–126)
BUN: 12 mg/dL (ref 8–23)
CALCIUM: 9.6 mg/dL (ref 8.9–10.3)
CHLORIDE: 95 mmol/L — AB (ref 98–111)
CO2: 36 mmol/L — AB (ref 22–32)
CREATININE: 0.81 mg/dL (ref 0.44–1.00)
GFR, Est AFR Am: >60 mL/min (ref >60)
GFR, Estimated: >60 mL/min (ref >60)
Glucose, Bld: 110 mg/dL — ABNORMAL HIGH (ref 70–99)
Potassium: 4 mmol/L (ref 3.5–5.1)
SODIUM: 141 mmol/L (ref 135–145)
Total Bilirubin: 0.4 mg/dL (ref 0.3–1.2)
Total Protein: 6.6 g/dL (ref 6.5–8.1)

## 2018-05-01 NOTE — Progress Notes (Signed)
Per Dr. Benay Spice, Patient's treatment was cancelled today due to [facility] of 1.0

## 2018-05-03 ENCOUNTER — Other Ambulatory Visit (INDEPENDENT_AMBULATORY_CARE_PROVIDER_SITE_OTHER): Payer: Medicare Other

## 2018-05-03 DIAGNOSIS — E039 Hypothyroidism, unspecified: Secondary | ICD-10-CM | POA: Diagnosis not present

## 2018-05-03 LAB — TSH: TSH: 3.37 u[IU]/mL (ref 0.35–4.50)

## 2018-05-05 ENCOUNTER — Other Ambulatory Visit: Payer: Self-pay

## 2018-05-05 ENCOUNTER — Emergency Department (HOSPITAL_COMMUNITY): Payer: Medicare Other

## 2018-05-05 ENCOUNTER — Encounter (HOSPITAL_COMMUNITY): Payer: Self-pay

## 2018-05-05 ENCOUNTER — Emergency Department (HOSPITAL_COMMUNITY)
Admission: EM | Admit: 2018-05-05 | Discharge: 2018-05-05 | Disposition: A | Discharge status: Home / Self Care | Payer: Medicare Other | Attending: Emergency Medicine | Admitting: Emergency Medicine

## 2018-05-05 DIAGNOSIS — R180 Malignant ascites: Secondary | ICD-10-CM | POA: Diagnosis not present

## 2018-05-05 DIAGNOSIS — R1084 Generalized abdominal pain: Secondary | ICD-10-CM | POA: Diagnosis not present

## 2018-05-05 DIAGNOSIS — N390 Urinary tract infection, site not specified: Secondary | ICD-10-CM | POA: Insufficient documentation

## 2018-05-05 DIAGNOSIS — K5730 Diverticulosis of large intestine without perforation or abscess without bleeding: Secondary | ICD-10-CM | POA: Diagnosis not present

## 2018-05-05 DIAGNOSIS — C569 Malignant neoplasm of unspecified ovary: Secondary | ICD-10-CM | POA: Diagnosis not present

## 2018-05-05 DIAGNOSIS — R1013 Epigastric pain: Secondary | ICD-10-CM | POA: Diagnosis not present

## 2018-05-05 DIAGNOSIS — R112 Nausea with vomiting, unspecified: Secondary | ICD-10-CM | POA: Diagnosis not present

## 2018-05-05 DIAGNOSIS — R188 Other ascites: Secondary | ICD-10-CM | POA: Diagnosis not present

## 2018-05-05 DIAGNOSIS — R1012 Left upper quadrant pain: Secondary | ICD-10-CM | POA: Diagnosis not present

## 2018-05-05 DIAGNOSIS — I10 Essential (primary) hypertension: Secondary | ICD-10-CM | POA: Diagnosis not present

## 2018-05-05 LAB — COMPREHENSIVE METABOLIC PANEL
ALBUMIN: 2.7 g/dL — AB (ref 3.5–5.0)
ALT: 11 U/L (ref 0–44)
ANION GAP: 4 — AB (ref 5–15)
AST: 17 U/L (ref 15–41)
Alkaline Phosphatase: 60 U/L (ref 38–126)
BUN: 16 mg/dL (ref 8–23)
CHLORIDE: 95 mmol/L — AB (ref 98–111)
CO2: 39 mmol/L — ABNORMAL HIGH (ref 22–32)
CREATININE: 1.01 mg/dL — AB (ref 0.44–1.00)
Calcium: 8.8 mg/dL — ABNORMAL LOW (ref 8.9–10.3)
GFR calc Af Amer: >60 mL/min (ref >60)
GFR calc non Af Amer: 53 mL/min — ABNORMAL LOW (ref >60)
Glucose, Bld: 112 mg/dL — ABNORMAL HIGH (ref 70–99)
POTASSIUM: 3.5 mmol/L (ref 3.5–5.1)
Sodium: 138 mmol/L (ref 135–145)
Total Bilirubin: 0.9 mg/dL (ref 0.3–1.2)
Total Protein: 6.3 g/dL — ABNORMAL LOW (ref 6.5–8.1)

## 2018-05-05 LAB — CBC
HEMATOCRIT: 30.6 % — AB (ref 36.0–46.0)
Hemoglobin: 8.9 g/dL — ABNORMAL LOW (ref 12.0–15.0)
MCH: 30.7 pg (ref 26.0–34.0)
MCHC: 29.1 g/dL — ABNORMAL LOW (ref 30.0–36.0)
MCV: 105.5 fL — ABNORMAL HIGH (ref 80.0–100.0)
NRBC: 0 % (ref 0.0–0.2)
PLATELETS: 251 10*3/uL (ref 150–400)
RBC: 2.9 MIL/uL — ABNORMAL LOW (ref 3.87–5.11)
RDW: 19 % — AB (ref 11.5–15.5)
WBC: 5.7 10*3/uL (ref 4.0–10.5)

## 2018-05-05 LAB — URINALYSIS, ROUTINE W REFLEX MICROSCOPIC
Bilirubin Urine: NEGATIVE
GLUCOSE, UA: NEGATIVE mg/dL
Hgb urine dipstick: NEGATIVE
Ketones, ur: 5 mg/dL — AB
NITRITE: NEGATIVE
PH: 5 (ref 5.0–8.0)
Protein, ur: 30 mg/dL — AB
SPECIFIC GRAVITY, URINE: 1.026 (ref 1.005–1.030)

## 2018-05-05 LAB — LIPASE, BLOOD: LIPASE: 20 U/L (ref 11–51)

## 2018-05-05 MED ORDER — ONDANSETRON HCL 4 MG/2ML IJ SOLN
4.0000 mg | Freq: Once | INTRAMUSCULAR | Status: AC | PRN
  Administered 2018-05-05: 4 mg via INTRAVENOUS
  Filled 2018-05-05: qty 2

## 2018-05-05 MED ORDER — CIPROFLOXACIN IN D5W 400 MG/200ML IV SOLN
400.0000 mg | Freq: Once | INTRAVENOUS | Status: AC
  Administered 2018-05-05: 400 mg via INTRAVENOUS
  Filled 2018-05-05: qty 200

## 2018-05-05 MED ORDER — SODIUM CHLORIDE 0.9 % IV SOLN
INTRAVENOUS | Status: DC
  Administered 2018-05-05: 15:00:00 via INTRAVENOUS

## 2018-05-05 MED ORDER — IOPAMIDOL (ISOVUE-300) INJECTION 61%
100.0000 mL | Freq: Once | INTRAVENOUS | Status: AC | PRN
  Administered 2018-05-05: 100 mL via INTRAVENOUS

## 2018-05-05 MED ORDER — METOCLOPRAMIDE HCL 5 MG/ML IJ SOLN
5.0000 mg | Freq: Once | INTRAMUSCULAR | Status: AC
  Administered 2018-05-05: 5 mg via INTRAVENOUS
  Filled 2018-05-05: qty 2

## 2018-05-05 MED ORDER — DIPHENHYDRAMINE HCL 50 MG/ML IJ SOLN
12.5000 mg | Freq: Once | INTRAMUSCULAR | Status: AC
  Administered 2018-05-05: 12.5 mg via INTRAVENOUS
  Filled 2018-05-05: qty 1

## 2018-05-05 MED ORDER — OXYCODONE HCL 5 MG PO TABS: 5.0000 mg | ORAL_TABLET | Freq: Four times a day (QID) | ORAL | 0 refills | Status: DC | PRN

## 2018-05-05 MED ORDER — ONDANSETRON 4 MG PO TBDP: 4.0000 mg | ORAL_TABLET | ORAL | 0 refills | Status: DC | PRN

## 2018-05-05 MED ORDER — CIPROFLOXACIN HCL 500 MG PO TABS: 500.0000 mg | ORAL_TABLET | Freq: Two times a day (BID) | ORAL | 0 refills | Status: DC

## 2018-05-05 MED ORDER — SODIUM CHLORIDE 0.9 % IV BOLUS
1000.0000 mL | Freq: Once | INTRAVENOUS | Status: AC
  Administered 2018-05-05: 1000 mL via INTRAVENOUS

## 2018-05-05 MED ORDER — HEPARIN SOD (PORK) LOCK FLUSH 100 UNIT/ML IV SOLN
500.0000 [IU] | Freq: Once | INTRAVENOUS | Status: AC
  Administered 2018-05-05: 500 [IU]
  Filled 2018-05-05: qty 5

## 2018-05-05 MED ORDER — MORPHINE SULFATE (PF) 4 MG/ML IV SOLN
4.0000 mg | Freq: Once | INTRAVENOUS | Status: AC
  Administered 2018-05-05: 4 mg via INTRAVENOUS
  Filled 2018-05-05: qty 1

## 2018-05-05 MED ORDER — SODIUM CHLORIDE 0.9 % IJ SOLN
INTRAMUSCULAR | Status: AC
  Filled 2018-05-05: qty 50

## 2018-05-05 MED ORDER — IOPAMIDOL (ISOVUE-300) INJECTION 61%
INTRAVENOUS | Status: AC
  Filled 2018-05-05: qty 100

## 2018-05-05 NOTE — ED Provider Notes (Signed)
Accepted at sign out from Dr. Zenia Resides. Follow up CT abdo. Getting Cipro for UTI. SBP not suspected at this time. Physical Exam  BP 140/70 (BP Location: Left Arm)   Pulse 97   Temp 98.6 F (37 C) (Oral)   Resp 17   Ht 5\' 1"  (1.549 m)   Wt 79 kg   SpO2 98%   BMI 32.91 kg/m   Physical Exam Patient is alert and nontoxic.  No respiratory distress.  Mental status clear.  It is regular.  Lungs are grossly clear.  Abdomen is mildly distended but soft.  No guarding.  No erythema. ED Course/Procedures     Procedures  MDM  CT results reviewed with the patient.  No acute changes as compared to previous.  Patient feels that her pain is being adequately controlled and her nausea would be better controlled if she were able to take her Zofran every 4 hours instead of every 8 hours.  Due to patient's ascites and some lobular appearance of the upper with no need for acetaminophen at this time, will change from Vicodin to oxycodone IR for the patient on a as needed basis.  Return precautions are reviewed.  Urine culture has been added.  Patient is aware of plan for follow-up.       Charlesetta Shanks, MD 05/05/18 [phone]

## 2018-05-05 NOTE — Discharge Instructions (Addendum)
1.  Take oxycodone 1 tablet every 6 hours as needed for pain control.  Zofran 4 mg every 4 hours as needed for nausea. 2.  Return to the emergency department if you develop fever, worsening pain, worsening shortness of breath or other concerning symptoms. 3.  Call your oncologist in the morning to have recheck as soon as possible. 4.  You have been given a prescription for ciprofloxacin for urinary tract infection.  Your doctor follow-up on the culture to determine if you need to continue the ciprofloxacin or if an antibiotic change is needed.

## 2018-05-05 NOTE — ED Triage Notes (Signed)
Patient arrived via POV, Patient is AOx4 and ambulatory at baseline however due to Chemo and pain patient has not been ambulating as much. Patient complaint today is severe abdominal pain that is intermittent going from 4 to 9 with burning sensation. Patient has also had nausea and vomiting. Patient is on 2L nasal canula at home, patient did not have chemo this past Wednesday due to Neutrophil count being to low.

## 2018-05-05 NOTE — ED Provider Notes (Signed)
[facility] DEPT Provider Note   CSN: [id number] Arrival date & time: 05/05/18  1257     History   Chief Complaint Chief Complaint  Patient presents with  . Abdominal Pain  . Cancer Patient  . N/V    HPI Lynn Ramirez is a 77 y.o. female.  77 year old female with history of ovarian cancer presents with 24-hour history of epigastric and left upper quadrant abdominal discomfort with associated nonbilious emesis.  No fever or diarrhea.  No bloody stools.  No cough or congestion.  Denies any urinary symptoms.  Symptoms are worse when she tries to eat and have been unresponsive to Zofran at home.  Patient does have history of ascites and had a paracentesis done approximately 12 days ago.     Past Medical History:  Diagnosis Date  . CKD (chronic kidney disease), stage II   . COPD (chronic obstructive pulmonary disease) ([place])   . Coronary artery calcification seen on CT scan   . Difficulty sleeping   . Eczema    hands  . Emphysema   . GERD (gastroesophageal reflux disease)   . H/O hydronephrosis   . History of transfusion    age 32  . Hyperlipidemia   . Hypertension   . Ovarian cancer ([place]) dx'd 09/2014   metastatic - prior malignant R pleural effusion and peritoneal carcinomatosis  . S/p nephrectomy     Patient Active Problem List   Diagnosis Date Noted  . Abnormal finding on lung imaging 04/01/2018  . Acute on chronic respiratory failure with hypoxia ([place]) 03/23/2018  . PNA (pneumonia) 03/23/2018  . Acute and chronic respiratory failure with hypoxia ([place]) 03/23/2018  . Normocytic anemia 02/22/2018  . Hypokalemia 02/22/2018  . On home oxygen therapy 02/22/2018  . Left leg pain 07/16/2017  . S/p nephrectomy   . History of transfusion   . H/O hydronephrosis   . GERD (gastroesophageal reflux disease)   . Eczema   . Difficulty sleeping   . Coronary artery calcification seen on CT scan   . CKD (chronic kidney disease), stage II   .  Chronic diastolic CHF (congestive heart failure) ([place]) 04/26/2017  . Cardiomyopathy ([place]) 04/26/2017  . Mixed incontinence urge and stress 01/31/2017  . Chronic cough 01/15/2017  . Patient on antineoplastic chemotherapy regimen 10/18/2016  . Pain and swelling of right lower extremity 10/02/2016  . Goals of care, counseling/discussion 09/26/2016  . Essential hypertension 08/07/2016  . B12 deficiency 08/07/2016  . Vitamin D deficiency 08/07/2016  . Diarrhea 07/06/2016  . Cramping of hands 07/06/2016  . Port catheter in place 11/02/2015  . Allergic rhinitis 11/02/2015  . Constipation   . Sigmoid stricture ([place])   . Obstipation 10/08/2015  . COPD GOLD III D   . Pulmonary nodules/lesions, multiple 08/08/2015  . Chronic respiratory failure with hypoxia ([place]) 06/20/2015  . SOB (shortness of breath) 06/16/2015  . CAD (coronary artery disease) 03/31/2015  . Hyperlipidemia 03/31/2015  . Claudication ([place]) 03/31/2015  . Chemotherapy-induced neuropathy ([place]) 03/25/2015  . Genetic testing 02/19/2015  . Malignant neoplasm of fallopian tube ([place]) 02/19/2015  . Family history of pancreatic cancer 02/19/2015  . Family history of breast cancer in female 02/19/2015  . Ovarian cancer ([place]) 12/29/2014  . Malignant ascites 10/30/2014  . Primary peritoneal carcinomatosis ([place]) 10/23/2014  . Metastatic adenocarcinoma ([place]) 10/21/2014  . Insomnia 10/11/2014  . Pelvic mass in female 10/05/2014  . Malignant pleural effusion 09/29/2014  . COPD exacerbation ([place]) 09/28/2014  . Encopresis 06/18/2014  Past Surgical History:  Procedure Laterality Date  . ABDOMINAL HYSTERECTOMY    . CHOLECYSTECTOMY N/A 08/09/2015   Procedure: Attempted LAPAROSCOPIC coverted  open CHOLECYSTECTOMY WITH INTRAOPERATIVE CHOLANGIOGRAM;  Surgeon: Autumn Messing III, MD;  Location: [facility];  Service: General;  Laterality: N/A;  . HAND SURGERY Right 1980's  . LAPAROTOMY N/A 12/29/2014   Procedure:  LAPAROTOMY;  Surgeon: Everitt Amber,  MD;  Location: [facility];  Service: Gynecology;  Laterality: N/A;  . NEPHRECTOMY    . ROBOTIC ASSISTED TOTAL HYSTERECTOMY WITH BILATERAL SALPINGO OOPHERECTOMY Bilateral 12/29/2014   Procedure: ROBOTIC ASSISTED TOTAL LAPAROSCOPIC HYSTERECTOMY WITH BILATERAL SALPINGO OOPHORECTOMY AND OOMENTECTOMY WITH RADICAL TUMOR [place] ;  Surgeon: Everitt Amber, MD;  Location: [facility];  Service: Gynecology;  Laterality: Bilateral;  . THORACENTESIS     several  . TONSILLECTOMY    . TUBAL LIGATION       OB History   None      Home Medications    Prior to Admission medications   Medication Sig Start Date End Date Taking? Authorizing Provider  acetaminophen (TYLENOL) 650 MG CR tablet Take 650 mg by mouth every 8 (eight) hours as needed for pain.    [provider]  albuterol (PROVENTIL) (2.5 MG/3ML) 0.083% nebulizer solution INHALE THE CONTENTS OF 1 VIAL VIA NEBULIZER EVERY 6 HOURS AS NEEDED FOR WHEEZING OR SHORTNESS OF BREATH Patient taking differently: Take 2.5 mg by nebulization every 6 (six) hours as needed for wheezing or shortness of breath.  02/25/18   Collene Gobble, MD  antiseptic oral rinse (BIOTENE) LIQD 15 mLs by Mouth Rinse route 3 (three) times daily.    [provider]  atorvastatin (LIPITOR) 10 MG tablet TAKE 1 TABLET(10 MG) BY MOUTH DAILY Patient taking differently: Take 10 mg by mouth daily at 6 PM.  08/24/17   Dorothy Spark, MD  benzonatate (TESSALON) 200 MG capsule Take 1 capsule (200 mg total) by mouth 3 (three) times daily as needed for cough. 04/01/18   Lauraine Rinne, NP  Biotin 2500 MCG CAPS Take 1 tablet by mouth daily.    [provider]  carvedilol (COREG) 3.125 MG tablet TAKE 1 TABLET(3.125 MG) BY MOUTH TWICE DAILY WITH A MEAL Patient taking differently: Take 3.125 mg by mouth 2 (two) times daily with a meal.  11/27/17   Dorothy Spark, MD  cholecalciferol (VITAMIN D) 1000 units tablet Take 1,000 Units by mouth daily.    [provider]    diphenoxylate-atropine (LOMOTIL) 2.5-0.025 MG tablet TAKE 2 TABLETS BY MOUTH FOUR TIMES DAILY AS NEEDED FOR DIARRHEA/ LOOSE STOOLS 08/03/17   Ladell Pier, MD  esomeprazole (NEXIUM) 40 MG capsule Take 40 mg by mouth daily at 12 noon.    [provider]  Fluticasone Furoate (ARNUITY ELLIPTA) 200 MCG/ACT AEPB Inhale 1 puff into the lungs daily. 04/01/18   Collene Gobble, MD  furosemide (LASIX) 40 MG tablet Take 1 tablet (40 mg total) by mouth daily. 12/10/17   Dorothy Spark, MD  gabapentin (NEURONTIN) 300 MG capsule TAKE 1 CAPSULE BY MOUTH EVERY NIGHT AT BEDTIME 03/07/18   Ladell Pier, MD  guaiFENesin (ROBITUSSIN) 100 MG/5ML SOLN Take 5 mLs (100 mg total) by mouth every 4 (four) hours as needed for cough or to loosen phlegm. 03/27/18   Mikhail, Velta Addison, DO  hydrOXYzine (VISTARIL) 25 MG capsule Take 1 capsule (25 mg total) by mouth 4 (four) times daily as needed for itching. 01/03/18   Ladell Pier, MD  levothyroxine (SYNTHROID, LEVOTHROID) 50 MCG tablet Take 1 tablet (50 mcg total) by mouth daily. 04/03/18   Midge Minium, MD  lidocaine-prilocaine (EMLA) cream Apply to port site one hour prior to use. Do not rub in. Cover with plastic. 10/12/16   Ladell Pier, MD  loratadine (CLARITIN) 10 MG tablet Take 10 mg by mouth daily.    [provider]  ondansetron (ZOFRAN-ODT) 4 MG disintegrating tablet Take 1 tablet (4 mg total) by mouth every 8 (eight) hours as needed for nausea or vomiting. 12/05/17   Ladell Pier, MD  oxyCODONE-acetaminophen (PERCOCET/ROXICET) 5-325 MG tablet Take 1 tablet by mouth every 6 (six) hours as needed for severe pain. 08/15/17   Ladell Pier, MD  Potassium Chloride ER 20 MEQ TBCR TAKE 1/2 TABLET BY MOUTH DAILY 03/19/18   Ladell Pier, MD  PROAIR HFA 108 [phone] Base) MCG/ACT inhaler INHALE 2 PUFFS INTO THE LUNGS FOUR TIMES DAILY AS NEEDED FOR WHEEZING Patient taking differently: Inhale 2 puffs into the lungs 4 (four) times daily as needed  for wheezing.  03/13/17   Collene Gobble, MD  pyridoxine (B-6) 100 MG tablet Take 100 mg by mouth daily.    [provider]  STIOLTO RESPIMAT 2.5-2.5 MCG/ACT AERS INHALE 2 PUFFS INTO THE LUNGS DAILY Patient taking differently: Take 2 puffs by mouth daily.  09/11/17   Collene Gobble, MD  traMADol (ULTRAM) 50 MG tablet Take 1 tablet (50 mg total) by mouth every 12 (twelve) hours as needed. for pain 09/26/17   Ladell Pier, MD  vitamin B-12 (CYANOCOBALAMIN) 100 MCG tablet Take 100 mcg by mouth daily. Reported on 01/19/2016    [provider]  zolpidem (AMBIEN) 5 MG tablet Take 1 tablet (5 mg total) by mouth at bedtime as needed for sleep. 08/01/17   [name], NP    Family History Family History  Problem Relation Age of Onset  . Diabetes Father   . Lung cancer Father 20       metastasis to liver  . Cancer Father        liver  . Pancreatic cancer Mother 16  . Stroke Paternal Grandfather   . Heart disease Maternal Aunt   . Diabetes Paternal Uncle   . Heart Problems Maternal Grandmother   . Heart Problems Maternal Grandfather   . Diabetes Paternal Grandmother   . Stroke Paternal Grandmother   . Heart Problems Paternal Uncle   . Cancer Cousin        unknown type  . Breast cancer Cousin        dx. 71s  . Leukemia Cousin        dx. 16-17  . Colon cancer Neg Hx   . Stomach cancer Neg Hx     Social History Social History   Tobacco Use  . Smoking status: Former Smoker    Packs/day: 1.00    Years: 40.00    Pack years: 40.00    Types: Cigarettes    Last attempt to quit: 07/10/2013    Years since quitting: 4.8  . Smokeless tobacco: Never Used  Substance Use Topics  . Alcohol use: Yes    Alcohol/week: 0.0 standard drinks    Comment: 1-2 a week  . Drug use: No     Allergies   Latex and Penicillins   Review of Systems Review of Systems  All other systems reviewed and are negative.    Physical Exam Updated Vital Signs BP (!) 144/75 (  BP Location:  Right Arm)   Pulse (!) 107   Temp 98.4 F (36.9 C) (Oral)   Resp 18   Ht 1.549 m (5\' 1" )   Wt 79 kg   SpO2 93%   BMI 32.91 kg/m   Physical Exam  Constitutional: She is oriented to person, place, and time. She appears well-developed and well-nourished.  Non-toxic appearance. No distress.  HENT:  Head: Normocephalic and atraumatic.  Eyes: Pupils are equal, round, and reactive to light. Conjunctivae, EOM and lids are normal.  Neck: Normal range of motion. Neck supple. No tracheal deviation present. No thyroid mass present.  Cardiovascular: Regular rhythm and normal heart sounds. Tachycardia present. Exam reveals no gallop.  No murmur heard. Pulmonary/Chest: Effort normal and breath sounds normal. No stridor. No respiratory distress. She has no decreased breath sounds. She has no wheezes. She has no rhonchi. She has no rales.  Abdominal: Soft. Normal appearance and bowel sounds are normal. She exhibits no distension. There is tenderness in the epigastric area and left upper quadrant. There is no rigidity, no rebound, no guarding and no CVA tenderness.    Musculoskeletal: Normal range of motion. She exhibits no edema or tenderness.  Neurological: She is alert and oriented to person, place, and time. She has normal strength. No cranial nerve deficit or sensory deficit. GCS eye subscore is 4. GCS verbal subscore is 5. GCS motor subscore is 6.  Skin: Skin is warm and dry. No abrasion and no rash noted.  Psychiatric: She has a normal mood and affect. Her speech is normal and behavior is normal.  Nursing note and vitals reviewed.    ED Treatments / Results  Labs (all labs ordered are listed, but only abnormal results are displayed) Labs Reviewed  LIPASE, BLOOD  COMPREHENSIVE METABOLIC PANEL  CBC  URINALYSIS, ROUTINE W REFLEX MICROSCOPIC    EKG None  Radiology No results found.  Procedures Procedures (including critical care time)  Medications Ordered in ED Medications    ondansetron (ZOFRAN) injection 4 mg (has no administration in time range)  sodium chloride 0.9 % bolus 1,000 mL (has no administration in time range)  0.9 %  sodium chloride infusion (has no administration in time range)  metoCLOPramide (REGLAN) injection 5 mg (has no administration in time range)  diphenhydrAMINE (BENADRYL) injection 12.5 mg (has no administration in time range)  morphine 4 MG/ML injection 4 mg (has no administration in time range)     Initial Impression / Assessment and Plan / ED Course  I have reviewed the triage vital signs and the nursing notes.  Pertinent labs & imaging results that were available during my care of the patient were reviewed by me and considered in my medical decision making (see chart for details).     Pt medicated for pain  Given iv fluids Uti tx with cipro abd pending Signed out to dr. Meliton Rattan  Final Clinical Impressions(s) / ED Diagnoses   Final diagnoses:  None    ED Discharge Orders    None       Lacretia Leigh, MD 05/05/18 1558

## 2018-05-06 ENCOUNTER — Encounter: Payer: Self-pay | Admitting: General Practice

## 2018-05-06 ENCOUNTER — Ambulatory Visit (INDEPENDENT_AMBULATORY_CARE_PROVIDER_SITE_OTHER)
Admission: RE | Admit: 2018-05-06 | Discharge: 2018-05-06 | Disposition: A | Discharge status: Home / Self Care | Payer: Medicare Other | Source: Ambulatory Visit | Attending: Pulmonary Disease | Admitting: Pulmonary Disease

## 2018-05-06 ENCOUNTER — Telehealth: Payer: Self-pay | Admitting: *Deleted

## 2018-05-06 DIAGNOSIS — J441 Chronic obstructive pulmonary disease with (acute) exacerbation: Secondary | ICD-10-CM | POA: Diagnosis not present

## 2018-05-06 DIAGNOSIS — R05 Cough: Secondary | ICD-10-CM | POA: Diagnosis not present

## 2018-05-06 DIAGNOSIS — R053 Chronic cough: Secondary | ICD-10-CM

## 2018-05-06 DIAGNOSIS — R918 Other nonspecific abnormal finding of lung field: Secondary | ICD-10-CM

## 2018-05-06 DIAGNOSIS — J90 Pleural effusion, not elsewhere classified: Secondary | ICD-10-CM | POA: Diagnosis not present

## 2018-05-06 NOTE — Progress Notes (Addendum)
Subjective:   Lynn Ramirez is a 77 y.o. female who presents for Medicare Annual (Subsequent) preventive examination.  Review of Systems:  No ROS.  Medicare Wellness Visit. Additional risk factors are reflected in the social history.  Cardiac Risk Factors include: advanced age (>5men, >48 women);dyslipidemia;hypertension;obesity (BMI >30kg/m2);sedentary lifestyle;family history of premature cardiovascular disease   Sleep patterns: Sleeps 6 hours.  Home Safety/Smoke Alarms: Feels safe in home. Smoke alarms in place.  Living environment; residence and Firearm Safety: Lives with husband in 1 story home. Son plans to move back for 1 month.  Seat Belt Safety/Bike Helmet: Wears seat belt.   Female:   Pap-2014      Mammo-04/10/2017, negative.  [facility]. Dexa scan-04/12/2016, normal. Order placed today.  [facility]-Colonoscopy 08/13/2014, polyps.      Objective:     Vitals: BP 112/60 (BP Location: Left Arm, Patient Position: Sitting, Cuff Size: Normal)   Pulse 80   Temp 98.1 F (36.7 C) (Temporal)   Resp 18   Ht 5\' 1"  (1.549 m)   Wt 173 lb 4 oz (78.6 kg)   SpO2 92%   BMI 32.74 kg/m   Body mass index is 32.74 kg/m.  Advanced Directives 05/07/2018 04/24/2018 03/26/2018 03/23/2018 03/23/2018 03/13/2018 03/03/2018  Does Patient Have a Medical Advance Directive? Yes Yes Yes Yes Yes Yes Yes  Type of Paramedic of [place];Living will [place];Living will [place];Living will Healthcare Power of [place];Living will [place];Living will [place];Living will  Does patient want to make changes to medical advance directive? - - No - Patient declined No - Patient declined - No - Patient declined -  Copy of [place] in Chart? Yes Yes No - copy requested No - copy requested No - copy requested Yes -  Would patient like information on creating a medical  advance directive? - - - - - - -    Tobacco Social History   Tobacco Use  Smoking Status Former Smoker  . Packs/day: 1.00  . Years: 40.00  . Pack years: 40.00  . Types: Cigarettes  . Last attempt to quit: 07/10/2013  . Years since quitting: 4.8  Smokeless Tobacco Never Used     Counseling given: Not Answered   Past Medical History:  Diagnosis Date  . CKD (chronic kidney disease), stage II   . COPD (chronic obstructive pulmonary disease) ([place])   . Coronary artery calcification seen on CT scan   . Difficulty sleeping   . Eczema    hands  . Emphysema   . GERD (gastroesophageal reflux disease)   . H/O hydronephrosis   . History of transfusion    age 35  . Hyperlipidemia   . Hypertension   . Ovarian cancer ([place]) dx'd 09/2014   metastatic - prior malignant R pleural effusion and peritoneal carcinomatosis  . S/p nephrectomy    Past Surgical History:  Procedure Laterality Date  . ABDOMINAL HYSTERECTOMY    . CHOLECYSTECTOMY N/A 08/09/2015   Procedure: Attempted LAPAROSCOPIC coverted  open CHOLECYSTECTOMY WITH INTRAOPERATIVE CHOLANGIOGRAM;  Surgeon: Autumn Messing III, MD;  Location: [facility];  Service: General;  Laterality: N/A;  . HAND SURGERY Right 1980's  . LAPAROTOMY N/A 12/29/2014   Procedure:  LAPAROTOMY;  Surgeon: Everitt Amber, MD;  Location: [facility];  Service: Gynecology;  Laterality: N/A;  . NEPHRECTOMY    . ROBOTIC ASSISTED TOTAL HYSTERECTOMY WITH BILATERAL SALPINGO OOPHERECTOMY Bilateral 12/29/2014  Procedure: ROBOTIC ASSISTED TOTAL LAPAROSCOPIC HYSTERECTOMY WITH BILATERAL SALPINGO OOPHORECTOMY AND OOMENTECTOMY WITH RADICAL TUMOR [place] ;  Surgeon: Everitt Amber, MD;  Location: [facility];  Service: Gynecology;  Laterality: Bilateral;  . THORACENTESIS     several  . TONSILLECTOMY    . TUBAL LIGATION     Family History  Problem Relation Age of Onset  . Diabetes Father   . Lung cancer Father 61       metastasis to liver  . Cancer Father        liver  . Pancreatic cancer  Mother 22  . Stroke Paternal Grandfather   . Heart disease Maternal Aunt   . Diabetes Paternal Uncle   . Heart Problems Maternal Grandmother   . Heart Problems Maternal Grandfather   . Diabetes Paternal Grandmother   . Stroke Paternal Grandmother   . Heart Problems Paternal Uncle   . Cancer Cousin        unknown type  . Breast cancer Cousin        dx. 40s  . Leukemia Cousin        dx. 16-17  . Colon cancer Neg Hx   . Stomach cancer Neg Hx    Social History   Socioeconomic History  . Marital status: Married    Spouse name: Not on file  . Number of children: 3  . Years of education: Not on file  . Highest education level: Not on file  Occupational History  . Occupation: RETIRED    Employer: RETIRED  Social Needs  . Financial resource strain: Not hard at all  . Food insecurity:    Worry: Never true    Inability: Never true  . Transportation needs:    Medical: No    Non-medical: No  Tobacco Use  . Smoking status: Former Smoker    Packs/day: 1.00    Years: 40.00    Pack years: 40.00    Types: Cigarettes    Last attempt to quit: 07/10/2013    Years since quitting: 4.8  . Smokeless tobacco: Never Used  Substance and Sexual Activity  . Alcohol use: Yes    Alcohol/week: 0.0 standard drinks    Comment: 1-2 a week  . Drug use: No  . Sexual activity: Never  Lifestyle  . Physical activity:    Days per week: 0 days    Minutes per session: 0 min  . Stress: Only a little  Relationships  . Social connections:    Talks on phone: More than three times a week    Gets together: More than three times a week    Attends religious service: More than 4 times per year    Active member of club or organization: Yes    Attends meetings of clubs or organizations: More than 4 times per year    Relationship status: Married  Other Topics Concern  . Not on file  Social History Narrative  . Not on file    Outpatient Encounter Medications as of 05/07/2018  Medication Sig  .  acetaminophen (TYLENOL) 650 MG CR tablet Take 650 mg by mouth every 8 (eight) hours as needed for pain.  Marland Kitchen albuterol (PROVENTIL) (2.5 MG/3ML) 0.083% nebulizer solution INHALE THE CONTENTS OF 1 VIAL VIA NEBULIZER EVERY 6 HOURS AS NEEDED FOR WHEEZING OR SHORTNESS OF BREATH (Patient taking differently: Take 2.5 mg by nebulization every 6 (six) hours as needed for wheezing or shortness of breath. )  . antiseptic oral rinse (BIOTENE) LIQD 15 mLs by Mouth  Rinse route 3 (three) times daily.  Marland Kitchen atorvastatin (LIPITOR) 10 MG tablet TAKE 1 TABLET(10 MG) BY MOUTH DAILY (Patient taking differently: Take 10 mg by mouth daily at 6 PM. )  . Biotin 2500 MCG CAPS Take 1 tablet by mouth daily.  . carvedilol (COREG) 3.125 MG tablet TAKE 1 TABLET(3.125 MG) BY MOUTH TWICE DAILY WITH A MEAL (Patient taking differently: Take 3.125 mg by mouth 2 (two) times daily with a meal. )  . cholecalciferol (VITAMIN D) 1000 units tablet Take 1,000 Units by mouth daily.  . ciprofloxacin (CIPRO) 500 MG tablet Take 1 tablet (500 mg total) by mouth 2 (two) times daily.  . diphenoxylate-atropine (LOMOTIL) 2.5-0.025 MG tablet TAKE 2 TABLETS BY MOUTH FOUR TIMES DAILY AS NEEDED FOR DIARRHEA/ LOOSE STOOLS  . esomeprazole (NEXIUM) 40 MG capsule Take 40 mg by mouth daily at 12 noon.  . Fluticasone Furoate (ARNUITY ELLIPTA) 200 MCG/ACT AEPB Inhale 1 puff into the lungs daily.  . furosemide (LASIX) 40 MG tablet Take 1 tablet (40 mg total) by mouth daily.  Marland Kitchen gabapentin (NEURONTIN) 300 MG capsule TAKE 1 CAPSULE BY MOUTH EVERY NIGHT AT BEDTIME  . levothyroxine (SYNTHROID, LEVOTHROID) 50 MCG tablet Take 1 tablet (50 mcg total) by mouth daily.  Marland Kitchen lidocaine-prilocaine (EMLA) cream Apply to port site one hour prior to use. Do not rub in. Cover with plastic.  Marland Kitchen loratadine (CLARITIN) 10 MG tablet Take 10 mg by mouth daily.  . ondansetron (ZOFRAN ODT) 4 MG disintegrating tablet Take 1 tablet (4 mg total) by mouth every 4 (four) hours as needed for nausea or  vomiting.  . ondansetron (ZOFRAN-ODT) 4 MG disintegrating tablet Take 1 tablet (4 mg total) by mouth every 8 (eight) hours as needed for nausea or vomiting.  Marland Kitchen oxyCODONE (ROXICODONE) 5 MG immediate release tablet Take 1 tablet (5 mg total) by mouth every 6 (six) hours as needed for moderate pain or severe pain.  Marland Kitchen oxyCODONE-acetaminophen (PERCOCET/ROXICET) 5-325 MG tablet Take 1 tablet by mouth every 6 (six) hours as needed for severe pain.  . OXYGEN Inhale 2 L into the lungs.  . Potassium Chloride ER 20 MEQ TBCR TAKE 1/2 TABLET BY MOUTH DAILY (Patient taking differently: Take 10 mg by mouth daily. )  . PROAIR HFA 108 (90 Base) MCG/ACT inhaler INHALE 2 PUFFS INTO THE LUNGS FOUR TIMES DAILY AS NEEDED FOR WHEEZING (Patient taking differently: Inhale 2 puffs into the lungs 4 (four) times daily as needed for wheezing. )  . pyridoxine (B-6) 100 MG tablet Take 100 mg by mouth daily.  Marland Kitchen STIOLTO RESPIMAT 2.5-2.5 MCG/ACT AERS INHALE 2 PUFFS INTO THE LUNGS DAILY (Patient taking differently: Take 2 puffs by mouth daily. )  . vitamin B-12 (CYANOCOBALAMIN) 100 MCG tablet Take 100 mcg by mouth daily. Reported on 01/19/2016  . benzonatate (TESSALON) 200 MG capsule Take 1 capsule (200 mg total) by mouth 3 (three) times daily as needed for cough. (Patient not taking: Reported on 05/07/2018)  . hydrOXYzine (VISTARIL) 25 MG capsule Take 1 capsule (25 mg total) by mouth 4 (four) times daily as needed for itching. (Patient not taking: Reported on 05/07/2018)  . traMADol (ULTRAM) 50 MG tablet Take 1 tablet (50 mg total) by mouth every 12 (twelve) hours as needed. for pain (Patient not taking: Reported on 05/07/2018)  . zolpidem (AMBIEN) 5 MG tablet Take 1 tablet (5 mg total) by mouth at bedtime as needed for sleep. (Patient not taking: Reported on 05/07/2018)  . [DISCONTINUED] guaiFENesin (ROBITUSSIN) 100 MG/5ML  SOLN Take 5 mLs (100 mg total) by mouth every 4 (four) hours as needed for cough or to loosen phlegm.   No  facility-administered encounter medications on file as of 05/07/2018.     Activities of Daily Living In your present state of health, do you have any difficulty performing the following activities: 05/07/2018 04/02/2018  Hearing? N N  Vision? N N  Difficulty concentrating or making decisions? N N  Walking or climbing stairs? Y N  Comment breathing issues with steps -  Dressing or bathing? N N  Doing errands, shopping? N N  Preparing Food and eating ? N -  Using the Toilet? N -  In the past six months, have you accidently leaked urine? N -  Do you have problems with loss of bowel control? N -  Managing your Medications? N -  Managing your Finances? N -  Housekeeping or managing your Housekeeping? N -  Some recent data might be hidden    Patient Care Team: Midge Minium, MD as PCP - General (Family Medicine) Dorothy Spark, MD as PCP - Cardiology (Cardiology) Collene Gobble, MD as Consulting Physician (Pulmonary Disease) Ladell Pier, MD as Consulting Physician (Oncology) Harriett Sine, MD as Consulting Physician (Dermatology) Everitt Amber, MD as Consulting Physician (Obstetrics and Gynecology) Latanya Maudlin, MD as Consulting Physician (Orthopedic Surgery)    Assessment:   This is a routine wellness examination for Lynn Ramirez.  Exercise Activities and Dietary recommendations Current Exercise Habits: The patient does not participate in regular exercise at present, Exercise limited by: respiratory conditions(s)   Diet (meal preparation, eat out, water intake, caffeinated beverages, dairy products, fruits and vegetables): Drinks water and occasional Coke.   Breakfast: skips; coffee; juice Lunch: half sandwich; pb on toast Dinner: protein and vegetables.   Goals    . Patient Stated     Increase activity    . Weight (lb) < 200 lb (90.7 kg)     Lose weight by starting weight watchers.        Fall Risk Fall Risk  05/07/2018 04/02/2018 03/12/2018 03/01/2018  02/08/2017  Falls in the past year? Yes Yes Yes Yes No  Comment tripped on sandle/cane - - - -  Number falls in past yr: 1 1 1 1  -  Injury with Fall? No Yes Yes Yes -  Comment - - - Hit head -  Follow up Falls prevention discussed Falls prevention discussed Falls prevention discussed - -    Depression Screen PHQ 2/9 Scores 05/07/2018 03/12/2018 08/03/2017 02/08/2017  PHQ - 2 Score 0 0 0 0  PHQ- 9 Score - 0 0 -     Cognitive Function MMSE - Mini Mental State Exam 05/07/2018  Orientation to time 5  Orientation to Place 5  Registration 3  Attention/ Calculation 5  Recall 1  Language- name 2 objects 2  Language- repeat 1  Language- follow 3 step command 3  Language- read & follow direction 1  Write a sentence 1  Copy design 1  Total score 28        Immunization History  Administered Date(s) Administered  . Influenza Split 03/26/2014  . Influenza Whole 04/09/2012  . Influenza, High Dose Seasonal PF 03/10/2013, 04/25/2017  . Influenza,inj,Quad PF,6+ Mos 05/06/2015, 04/12/2016, 03/20/2018  . Influenza,inj,quad, With Preservative 04/09/2017  . Pneumococcal Conjugate-13 05/06/2015  . Pneumococcal Polysaccharide-23 05/17/2016  . Pneumococcal-Unspecified 07/10/2016     Screening Tests Health Maintenance  Topic Date Due  . MAMMOGRAM  04/10/2018  .  DEXA SCAN  04/12/2018  . TETANUS/TDAP  08/03/2018 (Originally 04/07/1960)  . INFLUENZA VACCINE  Completed  . PNA vac Low Risk Adult  Completed        Plan:    Schedule mammogram and bone scan.   Continue doing brain stimulating activities (puzzles, reading, adult coloring books, staying active) to keep memory sharp.   I have personally reviewed and noted the following in the patient's chart:   . Medical and social history . Use of alcohol, tobacco or illicit drugs  . Current medications and supplements . Functional ability and status . Nutritional status . Physical activity . Advanced directives . List of other  physicians . Hospitalizations, surgeries, and ER visits in previous 12 months . Vitals . Screenings to include cognitive, depression, and falls . Referrals and appointments  In addition, I have reviewed and discussed with patient certain preventive protocols, quality metrics, and best practice recommendations. A written personalized care plan for preventive services as well as general preventive health recommendations were provided to patient.     Gerilyn Nestle, RN  05/07/2018  Reviewed documentation provided by RN and agree w/ above.  Annye Asa, MD

## 2018-05-06 NOTE — Telephone Encounter (Signed)
Received TC from patient this am.  She states that she was een in ED yesterday d/t abd pain.  She had Abd/pelvic CT scan done. At time of discharge, she was told to follow up with Dr. Benay Spice this week regarding labs, possible UTI and CT scan results Currently, she is scheduled to see him on 05/15/18 with labs etc.   She did have labs done yesterday as well. Please advise.

## 2018-05-06 NOTE — Telephone Encounter (Signed)
Notified patient that MD has reviewed labs and CT scan and her appointment for 05/15/18 is OK. We will follow up on her culture report and call if it shows an antibiotic change needs to occur. Continue Cipro for now.

## 2018-05-07 ENCOUNTER — Other Ambulatory Visit: Payer: Self-pay

## 2018-05-07 ENCOUNTER — Ambulatory Visit (INDEPENDENT_AMBULATORY_CARE_PROVIDER_SITE_OTHER): Payer: Medicare Other | Admitting: Family Medicine

## 2018-05-07 ENCOUNTER — Encounter: Payer: Self-pay | Admitting: Family Medicine

## 2018-05-07 ENCOUNTER — Ambulatory Visit (INDEPENDENT_AMBULATORY_CARE_PROVIDER_SITE_OTHER): Payer: Medicare Other

## 2018-05-07 VITALS — BP 112/60 | HR 80 | Temp 98.1°F | Resp 18 | Ht 61.0 in | Wt 173.2 lb

## 2018-05-07 DIAGNOSIS — E785 Hyperlipidemia, unspecified: Secondary | ICD-10-CM | POA: Diagnosis not present

## 2018-05-07 DIAGNOSIS — I10 Essential (primary) hypertension: Secondary | ICD-10-CM

## 2018-05-07 DIAGNOSIS — E559 Vitamin D deficiency, unspecified: Secondary | ICD-10-CM | POA: Diagnosis not present

## 2018-05-07 DIAGNOSIS — Z1239 Encounter for other screening for malignant neoplasm of breast: Secondary | ICD-10-CM

## 2018-05-07 DIAGNOSIS — I2510 Atherosclerotic heart disease of native coronary artery without angina pectoris: Secondary | ICD-10-CM | POA: Diagnosis not present

## 2018-05-07 DIAGNOSIS — E2839 Other primary ovarian failure: Secondary | ICD-10-CM

## 2018-05-07 DIAGNOSIS — R1010 Upper abdominal pain, unspecified: Secondary | ICD-10-CM | POA: Diagnosis not present

## 2018-05-07 DIAGNOSIS — Z0000 Encounter for general adult medical examination without abnormal findings: Secondary | ICD-10-CM | POA: Diagnosis not present

## 2018-05-07 DIAGNOSIS — E669 Obesity, unspecified: Secondary | ICD-10-CM | POA: Diagnosis not present

## 2018-05-07 DIAGNOSIS — R109 Unspecified abdominal pain: Secondary | ICD-10-CM | POA: Insufficient documentation

## 2018-05-07 DIAGNOSIS — R5381 Other malaise: Secondary | ICD-10-CM | POA: Diagnosis not present

## 2018-05-07 NOTE — Progress Notes (Signed)
   Subjective:    Patient ID: Lynn Ramirez, female    DOB: 14-Jun-1941, 77 y.o.   MRN: [id number]  HPI Hyperlipidemia- chronic problem, on Lipitor 10mg  daily.  Due for labs.  Ongoing abdominal pain due to cancer and ascites.  HTN- chronic problem.  Well controlled on Lasix and Coreg.  Denies CP.  Chronic SOB on O2.  No HAs, edema.  abd pain- pt went to ER over the weekend due to a burning pain across her upper and mid abdomen.  She was vomiting at the time.  Had imaging and labs done.  Images were unchanged.  Vomiting has stopped.  Decreased appetite.  Is on Cipro for UTI.  Deconditioning- pt would like to restart PT at [facility]dee Hensen)   Review of Systems For ROS see HPI     Objective:   Physical Exam  Constitutional: She is oriented to person, place, and time. She appears well-developed and well-nourished. No distress.  HENT:  Head: Normocephalic and atraumatic.  Eyes: Pupils are equal, round, and reactive to light. Conjunctivae and EOM are normal.  Neck: Normal range of motion. Neck supple. No thyromegaly present.  Cardiovascular: Normal rate, regular rhythm, normal heart sounds and intact distal pulses.  No murmur heard. Pulmonary/Chest: No respiratory distress.  O2 via [place] in place Decreased breath sounds at bases but no overt crackles heard  Abdominal: Soft. She exhibits distension (mild distension w/ mild TTP over upper abdomen). There is tenderness. There is no rebound and no guarding.  Musculoskeletal: She exhibits edema (1+ edema of LEs bilaterally).  Lymphadenopathy:    She has no cervical adenopathy.  Neurological: She is alert and oriented to person, place, and time.  Skin: Skin is warm and dry.  Psychiatric: She has a normal mood and affect. Her behavior is normal.  Vitals reviewed.         Assessment & Plan:

## 2018-05-07 NOTE — Patient Instructions (Signed)
Follow up in 6 months to recheck cholesterol and BP I'll reach out to [facility] and see if they are able to draw labs They should call you with your PT appt Call with any questions or concerns Hang in there!!!

## 2018-05-07 NOTE — Patient Instructions (Addendum)
Schedule mammogram and bone scan.   Continue doing brain stimulating activities (puzzles, reading, adult coloring books, staying active) to keep memory sharp.   Health Maintenance, Female Adopting a healthy lifestyle and getting preventive care can go a long way to promote health and wellness. Talk with your health care provider about what schedule of regular examinations is right for you. This is a good chance for you to check in with your provider about disease prevention and staying healthy. In between checkups, there are plenty of things you can do on your own. Experts have done a lot of research about which lifestyle changes and preventive measures are most likely to keep you healthy. Ask your health care provider for more information. Weight and diet Eat a healthy diet  Be sure to include plenty of vegetables, fruits, low-fat dairy products, and lean protein.  Do not eat a lot of foods high in solid fats, added sugars, or salt.  Get regular exercise. This is one of the most important things you can do for your health. ? Most adults should exercise for at least 150 minutes each week. The exercise should increase your heart rate and make you sweat (moderate-intensity exercise). ? Most adults should also do strengthening exercises at least twice a week. This is in addition to the moderate-intensity exercise.  Maintain a healthy weight  Body mass index (BMI) is a measurement that can be used to identify possible weight problems. It estimates body fat based on height and weight. Your health care provider can help determine your BMI and help you achieve or maintain a healthy weight.  For females 5 years of age and older: ? A BMI below 18.5 is considered underweight. ? A BMI of 18.5 to 24.9 is normal. ? A BMI of 25 to 29.9 is considered overweight. ? A BMI of 30 and above is considered obese.  Watch levels of cholesterol and blood lipids  You should start having your blood tested for  lipids and cholesterol at 75 years of age, then have this test every 5 years.  You may need to have your cholesterol levels checked more often if: ? Your lipid or cholesterol levels are high. ? You are older than 75 years of age. ? You are at high risk for heart disease.  Cancer screening Lung Cancer  Lung cancer screening is recommended for adults 64-30 years old who are at high risk for lung cancer because of a history of smoking.  A yearly low-dose CT scan of the lungs is recommended for people who: ? Currently smoke. ? Have quit within the past 15 years. ? Have at least a 30-pack-year history of smoking. A pack year is smoking an average of one pack of cigarettes a day for 1 year.  Yearly screening should continue until it has been 15 years since you quit.  Yearly screening should stop if you develop a health problem that would prevent you from having lung cancer treatment.  Breast Cancer  Practice breast self-awareness. This means understanding how your breasts normally appear and feel.  It also means doing regular breast self-exams. Let your health care provider know about any changes, no matter how small.  If you are in your 20s or 30s, you should have a clinical breast exam (CBE) by a health care provider every 1-3 years as part of a regular health exam.  If you are 63 or older, have a CBE every year. Also consider having a breast X-ray (mammogram) every year.  If you have a family history of breast cancer, talk to your health care provider about genetic screening.  If you are at high risk for breast cancer, talk to your health care provider about having an MRI and a mammogram every year.  Breast cancer gene (BRCA) assessment is recommended for women who have family members with BRCA-related cancers. BRCA-related cancers include: ? Breast. ? Ovarian. ? Tubal. ? Peritoneal cancers.  Results of the assessment will determine the need for genetic counseling and BRCA1 and  BRCA2 testing.  Cervical Cancer Your health care provider may recommend that you be screened regularly for cancer of the pelvic organs (ovaries, uterus, and vagina). This screening involves a pelvic examination, including checking for microscopic changes to the surface of your cervix (Pap test). You may be encouraged to have this screening done every 3 years, beginning at age 66.  For women ages 28-65, health care providers may recommend pelvic exams and Pap testing every 3 years, or they may recommend the Pap and pelvic exam, combined with testing for human papilloma virus (HPV), every 5 years. Some types of HPV increase your risk of cervical cancer. Testing for HPV may also be done on women of any age with unclear Pap test results.  Other health care providers may not recommend any screening for nonpregnant women who are considered low risk for pelvic cancer and who do not have symptoms. Ask your health care provider if a screening pelvic exam is right for you.  If you have had past treatment for cervical cancer or a condition that could lead to cancer, you need Pap tests and screening for cancer for at least 20 years after your treatment. If Pap tests have been discontinued, your risk factors (such as having a new sexual partner) need to be reassessed to determine if screening should resume. Some women have medical problems that increase the chance of getting cervical cancer. In these cases, your health care provider may recommend more frequent screening and Pap tests.  Colorectal Cancer  This type of cancer can be detected and often prevented.  Routine colorectal cancer screening usually begins at 75 years of age and continues through 75 years of age.  Your health care provider may recommend screening at an earlier age if you have risk factors for colon cancer.  Your health care provider may also recommend using home test kits to check for hidden blood in the stool.  A small camera at the  end of a tube can be used to examine your colon directly (sigmoidoscopy or colonoscopy). This is done to check for the earliest forms of colorectal cancer.  Routine screening usually begins at age 3.  Direct examination of the colon should be repeated every 5-10 years through 75 years of age. However, you may need to be screened more often if early forms of precancerous polyps or small growths are found.  Skin Cancer  Check your skin from head to toe regularly.  Tell your health care provider about any new moles or changes in moles, especially if there is a change in a mole's shape or color.  Also tell your health care provider if you have a mole that is larger than the size of a pencil eraser.  Always use sunscreen. Apply sunscreen liberally and repeatedly throughout the day.  Protect yourself by wearing long sleeves, pants, a wide-brimmed hat, and sunglasses whenever you are outside.  Heart disease, diabetes, and high blood pressure  High blood pressure causes heart disease and  the risk of stroke. High blood pressure is more likely to develop in: ? People who have blood pressure in the high end of the normal range (130-139/85-89 mm Hg). ? People who are overweight or obese. ? People who are African American.  If you are 18-39 years of age, have your blood pressure checked every 3-5 years. If you are 40 years of age or older, have your blood pressure checked every year. You should have your blood pressure measured twice-once when you are at a hospital or clinic, and once when you are not at a hospital or clinic. Record the average of the two measurements. To check your blood pressure when you are not at a hospital or clinic, you can use: ? An automated blood pressure machine at a pharmacy. ? A home blood pressure monitor.  If you are between 55 years and 79 years old, ask your health care provider if you should take aspirin to prevent strokes.  Have regular diabetes screenings. This  involves taking a blood sample to check your fasting blood sugar level. ? If you are at a normal weight and have a low risk for diabetes, have this test once every three years after 75 years of age. ? If you are overweight and have a high risk for diabetes, consider being tested at a younger age or more often. Preventing infection Hepatitis B  If you have a higher risk for hepatitis B, you should be screened for this virus. You are considered at high risk for hepatitis B if: ? You were born in a country where hepatitis B is common. Ask your health care provider which countries are considered high risk. ? Your parents were born in a high-risk country, and you have not been immunized against hepatitis B (hepatitis B vaccine). ? You have HIV or AIDS. ? You use needles to inject street drugs. ? You live with someone who has hepatitis B. ? You have had sex with someone who has hepatitis B. ? You get hemodialysis treatment. ? You take certain medicines for conditions, including cancer, organ transplantation, and autoimmune conditions.  Hepatitis C  Blood testing is recommended for: ? Everyone born from 1945 through 1965. ? Anyone with known risk factors for hepatitis C.  Sexually transmitted infections (STIs)  You should be screened for sexually transmitted infections (STIs) including gonorrhea and chlamydia if: ? You are sexually active and are younger than 75 years of age. ? You are older than 75 years of age and your health care provider tells you that you are at risk for this type of infection. ? Your sexual activity has changed since you were last screened and you are at an increased risk for chlamydia or gonorrhea. Ask your health care provider if you are at risk.  If you do not have HIV, but are at risk, it may be recommended that you take a prescription medicine daily to prevent HIV infection. This is called pre-exposure prophylaxis (PrEP). You are considered at risk if: ? You are  sexually active and do not regularly use condoms or know the HIV status of your partner(s). ? You take drugs by injection. ? You are sexually active with a partner who has HIV.  Talk with your health care provider about whether you are at high risk of being infected with HIV. If you choose to begin PrEP, you should first be tested for HIV. You should then be tested every 3 months for as long as you are taking PrEP.   Pregnancy  If you are premenopausal and you may become pregnant, ask your health care provider about preconception counseling.  If you may become pregnant, take 400 to 800 micrograms (mcg) of folic acid every day.  If you want to prevent pregnancy, talk to your health care provider about birth control (contraception). Osteoporosis and menopause  Osteoporosis is a disease in which the bones lose minerals and strength with aging. This can result in serious bone fractures. Your risk for osteoporosis can be identified using a bone density scan.  If you are 65 years of age or older, or if you are at risk for osteoporosis and fractures, ask your health care provider if you should be screened.  Ask your health care provider whether you should take a calcium or vitamin D supplement to lower your risk for osteoporosis.  Menopause may have certain physical symptoms and risks.  Hormone replacement therapy may reduce some of these symptoms and risks. Talk to your health care provider about whether hormone replacement therapy is right for you. Follow these instructions at home:  Schedule regular health, dental, and eye exams.  Stay current with your immunizations.  Do not use any tobacco products including cigarettes, chewing tobacco, or electronic cigarettes.  If you are pregnant, do not drink alcohol.  If you are breastfeeding, limit how much and how often you drink alcohol.  Limit alcohol intake to no more than 1 drink per day for nonpregnant women. One drink equals 12 ounces of  beer, 5 ounces of wine, or 1 ounces of hard liquor.  Do not use street drugs.  Do not share needles.  Ask your health care provider for help if you need support or information about quitting drugs.  Tell your health care provider if you often feel depressed.  Tell your health care provider if you have ever been abused or do not feel safe at home. This information is not intended to replace advice given to you by your health care provider. Make sure you discuss any questions you have with your health care provider. Document Released: 01/09/2011 Document Revised: 12/02/2015 Document Reviewed: 03/30/2015 Elsevier Interactive Patient Education  2018 [employer]  

## 2018-05-08 ENCOUNTER — Other Ambulatory Visit: Payer: Self-pay | Admitting: Nurse Practitioner

## 2018-05-08 DIAGNOSIS — E559 Vitamin D deficiency, unspecified: Secondary | ICD-10-CM

## 2018-05-08 DIAGNOSIS — C5700 Malignant neoplasm of unspecified fallopian tube: Secondary | ICD-10-CM

## 2018-05-08 DIAGNOSIS — E785 Hyperlipidemia, unspecified: Secondary | ICD-10-CM

## 2018-05-08 LAB — URINE CULTURE

## 2018-05-08 NOTE — Assessment & Plan Note (Signed)
Chronic problem.  Excellent control.  No med changes at this time 

## 2018-05-08 NOTE — Assessment & Plan Note (Signed)
Ongoing issue for pt.  Reviewed ER notes, labs, imaging.  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  Will defer additional tx to oncology but discussed that pt may need to have the fluid drained.  She is aware.

## 2018-05-08 NOTE — Assessment & Plan Note (Signed)
Pt has hx of this.  Due for repeat labs but will message Oncology to determine if they are able to draw these at upcoming visit b/c we are not able to access her port here in the office.

## 2018-05-08 NOTE — Assessment & Plan Note (Signed)
Chronic problem.  Tolerating statin w/o difficulty.  Due for labs but will ask Oncology to draw.

## 2018-05-09 ENCOUNTER — Telehealth: Payer: Self-pay | Admitting: Emergency Medicine

## 2018-05-09 NOTE — Telephone Encounter (Signed)
Spoke with patient. She stated that she had a CT scan on 05/06/18 that was ordered by Aaron Edelman and was requesting the results. Advised patient that I would send a message to [facility] to advise on results. She verbalized understanding.    Aaron Edelman, please advise on the CT scan results from 05/06/18. Thank you!

## 2018-05-09 NOTE — Telephone Encounter (Signed)
Chest CT results listed below:  1. Since previous chest CT of 6 weeks ago, there are enlarging left-greater-than-right pleural effusions with increasing linear atelectasis in the left lower lobe and lingula. No confluent airspace opacity or suspicious pulmonary nodule identified. 2. No pleural based nodularity is identified. However, as seen on recent abdominal CT, there is loculated ascites in the upper abdomen which remains moderately suspicious for peritoneal carcinomatosis in this patient with a history of ovarian cancer. 3. No thoracic adenopathy.   Does the patient have increased shortness of breath?  Is she having difficulty breathing when lying flat? There is still a pleural effusion that is greater in the left vs. Right.   Ensure she has follow-up with oncology regarding her abdominal CT results. Please route CT results to her oncologist.  Keep follow up with our office.  Wyn Quaker FNP

## 2018-05-09 NOTE — Telephone Encounter (Signed)
Spoke with patient in regards to results, she verbalized understanding. She stated that she has not experienced any more SOB. She has been using her albuterol nebulizer solution when her chest feels tight and this has been working well for her.   She did state that she has been having issues with fluid around her stomach. She has scheduled to see Dr. Learta Codding next Wednesday to address this. Advised her that I would send a copy of her results over to his office, she verbalized understanding.   Nothing further needed at time of call.

## 2018-05-10 ENCOUNTER — Other Ambulatory Visit: Payer: Self-pay | Admitting: *Deleted

## 2018-05-10 ENCOUNTER — Telehealth: Payer: Self-pay | Admitting: *Deleted

## 2018-05-10 ENCOUNTER — Ambulatory Visit (HOSPITAL_COMMUNITY)
Admission: RE | Admit: 2018-05-10 | Discharge: 2018-05-10 | Disposition: A | Discharge status: Home / Self Care | Payer: Medicare Other | Source: Ambulatory Visit | Attending: Oncology | Admitting: Oncology

## 2018-05-10 DIAGNOSIS — C5700 Malignant neoplasm of unspecified fallopian tube: Secondary | ICD-10-CM

## 2018-05-10 DIAGNOSIS — R180 Malignant ascites: Secondary | ICD-10-CM | POA: Diagnosis not present

## 2018-05-10 DIAGNOSIS — Z8544 Personal history of malignant neoplasm of other female genital organs: Secondary | ICD-10-CM | POA: Diagnosis not present

## 2018-05-10 MED ORDER — LIDOCAINE HCL 1 % IJ SOLN
INTRAMUSCULAR | Status: AC
  Filled 2018-05-10: qty 10

## 2018-05-10 NOTE — Procedures (Signed)
Ultrasound-guided  therapeutic paracentesis performed yielding 900 cc of yellow fluid. No immediate complications.

## 2018-05-10 NOTE — Telephone Encounter (Signed)
Spoke with pt and instructed pt to be at [facility] radiology by 145 pm today for paracentesis procedure as ok per Dr. Benay Spice. Pt voiced understanding.

## 2018-05-10 NOTE — Telephone Encounter (Signed)
Spoke with pt and was informed that pt was feeling very uncomfortable.  Stated fluid has built up in abdomen again.  Pt would like to have paracentesis done today if possible.  Denied shortness of breath, just pain and discomfort. Pt's    Phone     [phone]

## 2018-05-12 NOTE — Progress Notes (Signed)
See 05/09/18 telephone encounter.   Wyn Quaker FNP

## 2018-05-15 ENCOUNTER — Inpatient Hospital Stay: Payer: Medicare Other

## 2018-05-15 ENCOUNTER — Other Ambulatory Visit: Payer: Self-pay | Admitting: *Deleted

## 2018-05-15 ENCOUNTER — Encounter: Payer: Self-pay | Admitting: Nurse Practitioner

## 2018-05-15 ENCOUNTER — Inpatient Hospital Stay (HOSPITAL_BASED_OUTPATIENT_CLINIC_OR_DEPARTMENT_OTHER): Payer: Medicare Other | Admitting: Nurse Practitioner

## 2018-05-15 VITALS — BP 136/61 | HR 99 | Temp 97.5°F | Resp 17 | Ht 61.0 in | Wt 174.1 lb

## 2018-05-15 DIAGNOSIS — J910 Malignant pleural effusion: Secondary | ICD-10-CM | POA: Insufficient documentation

## 2018-05-15 DIAGNOSIS — E559 Vitamin D deficiency, unspecified: Secondary | ICD-10-CM

## 2018-05-15 DIAGNOSIS — C5700 Malignant neoplasm of unspecified fallopian tube: Secondary | ICD-10-CM

## 2018-05-15 DIAGNOSIS — R112 Nausea with vomiting, unspecified: Secondary | ICD-10-CM | POA: Insufficient documentation

## 2018-05-15 DIAGNOSIS — C786 Secondary malignant neoplasm of retroperitoneum and peritoneum: Secondary | ICD-10-CM

## 2018-05-15 DIAGNOSIS — C562 Malignant neoplasm of left ovary: Secondary | ICD-10-CM | POA: Insufficient documentation

## 2018-05-15 DIAGNOSIS — Z5111 Encounter for antineoplastic chemotherapy: Secondary | ICD-10-CM | POA: Insufficient documentation

## 2018-05-15 DIAGNOSIS — R180 Malignant ascites: Secondary | ICD-10-CM | POA: Insufficient documentation

## 2018-05-15 DIAGNOSIS — K30 Functional dyspepsia: Secondary | ICD-10-CM

## 2018-05-15 DIAGNOSIS — Z79899 Other long term (current) drug therapy: Secondary | ICD-10-CM

## 2018-05-15 DIAGNOSIS — C561 Malignant neoplasm of right ovary: Secondary | ICD-10-CM

## 2018-05-15 DIAGNOSIS — N390 Urinary tract infection, site not specified: Secondary | ICD-10-CM

## 2018-05-15 DIAGNOSIS — Z95828 Presence of other vascular implants and grafts: Secondary | ICD-10-CM

## 2018-05-15 DIAGNOSIS — R188 Other ascites: Secondary | ICD-10-CM

## 2018-05-15 DIAGNOSIS — E785 Hyperlipidemia, unspecified: Secondary | ICD-10-CM

## 2018-05-15 LAB — URINALYSIS, COMPLETE (UACMP) WITH MICROSCOPIC
BILIRUBIN URINE: NEGATIVE
GLUCOSE, UA: NEGATIVE mg/dL
Hgb urine dipstick: NEGATIVE
KETONES UR: 5 mg/dL — AB
Leukocytes, UA: NEGATIVE
Nitrite: NEGATIVE
Protein, ur: 30 mg/dL — AB
Specific Gravity, Urine: 1.028 (ref 1.005–1.030)
pH: 5 (ref 5.0–8.0)

## 2018-05-15 LAB — CBC WITH DIFFERENTIAL (CANCER CENTER ONLY)
Abs Immature Granulocytes: 0.04 10*3/uL (ref 0.00–0.07)
BASOS ABS: 0 10*3/uL (ref 0.0–0.1)
BASOS PCT: 1 %
EOS ABS: 0.1 10*3/uL (ref 0.0–0.5)
Eosinophils Relative: 2 %
HCT: 30.3 % — ABNORMAL LOW (ref 36.0–46.0)
Hemoglobin: 9 g/dL — ABNORMAL LOW (ref 12.0–15.0)
IMMATURE GRANULOCYTES: 1 %
LYMPHS PCT: 15 %
Lymphs Abs: 0.9 10*3/uL (ref 0.7–4.0)
MCH: 30.2 pg (ref 26.0–34.0)
MCHC: 29.7 g/dL — ABNORMAL LOW (ref 30.0–36.0)
MCV: 101.7 fL — ABNORMAL HIGH (ref 80.0–100.0)
Monocytes Absolute: 0.7 10*3/uL (ref 0.1–1.0)
Monocytes Relative: 11 %
NEUTROS ABS: 4.7 10*3/uL (ref 1.7–7.7)
NEUTROS PCT: 70 %
PLATELETS: 355 10*3/uL (ref 150–400)
RBC: 2.98 MIL/uL — ABNORMAL LOW (ref 3.87–5.11)
RDW: 17.9 % — AB (ref 11.5–15.5)
WBC: 6.5 10*3/uL (ref 4.0–10.5)
nRBC: 0 % (ref 0.0–0.2)

## 2018-05-15 LAB — CMP (CANCER CENTER ONLY)
ALBUMIN: 2.4 g/dL — AB (ref 3.5–5.0)
ALT: 7 U/L (ref 0–44)
ANION GAP: 9 (ref 5–15)
AST: 18 U/L (ref 15–41)
Alkaline Phosphatase: 73 U/L (ref 38–126)
BUN: 16 mg/dL (ref 8–23)
CHLORIDE: 94 mmol/L — AB (ref 98–111)
CO2: 36 mmol/L — ABNORMAL HIGH (ref 22–32)
Calcium: 9.1 mg/dL (ref 8.9–10.3)
Creatinine: 0.94 mg/dL (ref 0.44–1.00)
GFR, Est AFR Am: >60 mL/min (ref >60)
GFR, Estimated: 58 mL/min — ABNORMAL LOW (ref >60)
GLUCOSE: 104 mg/dL — AB (ref 70–99)
POTASSIUM: 3.7 mmol/L (ref 3.5–5.1)
Sodium: 139 mmol/L (ref 135–145)
Total Bilirubin: 0.3 mg/dL (ref 0.3–1.2)
Total Protein: 6.6 g/dL (ref 6.5–8.1)

## 2018-05-15 LAB — LIPID PANEL
Cholesterol: 104 mg/dL (ref 0–200)
HDL: 24 mg/dL — AB (ref >40)
LDL CALC: 54 mg/dL (ref 0–99)
Total CHOL/HDL Ratio: 4.3 RATIO
Triglycerides: 130 mg/dL (ref <150)
VLDL: 26 mg/dL (ref 0–40)

## 2018-05-15 MED ORDER — SODIUM CHLORIDE 0.9% FLUSH
10.0000 mL | Freq: Once | INTRAVENOUS | Status: AC
  Administered 2018-05-15: 10 mL
  Filled 2018-05-15: qty 10

## 2018-05-15 NOTE — Progress Notes (Signed)
DISCONTINUE ON PATHWAY REGIMEN - Ovarian     A cycle is every 21 days:     Gemcitabine   **Always confirm dose/schedule in your pharmacy ordering system**  REASON: Disease Progression PRIOR TREATMENT: OVOS99: Gemcitabine 1,000 mg/m2 D1, 8 q21 Days; Re-evaluate Every 3 Cycles, Treat until Complete Response, Unacceptable Toxicity, or Disease Progression TREATMENT RESPONSE: Progressive Disease (PD)  START ON PATHWAY REGIMEN - Ovarian     A cycle is every 28 days:     Liposomal doxorubicin   **Always confirm dose/schedule in your pharmacy ordering system**  Patient Characteristics: Recurrent or Progressive Disease, [place] [facility], BRCA Mutation Absent/Unknown Therapeutic Status: Recurrent or Progressive Disease BRCA Mutation Status: Absent Line of Therapy: [facility] [place] [facility]  Intent of Therapy: Non-Curative / Palliative Intent, Discussed with Patient 

## 2018-05-15 NOTE — Progress Notes (Addendum)
[place] OFFICE PROGRESS NOTE   Diagnosis: Fallopian tube carcinoma  INTERVAL HISTORY:   Lynn Ramirez returns as scheduled.  She completed another treatment with gemcitabine 04/24/2018.  Gemcitabine was held on 05/01/2018 due to neutropenia.  She was seen in the emergency department 05/05/2018 for evaluation of nausea/vomiting/abdominal pain.  CT abdomen/pelvis showed diffuse submucosal fatty replacement and edema of the distal jejunal and ileal loops.  Moderate volume of ascites noted.  Slight increase in small pleural effusions left greater than right.  She underwent a paracentesis procedure 05/10/2018 with 900 cc of fluid removed.  She feels the abdominal fluid is recurring.  She is having intermittent nausea.  Bowels moving regularly.  She has abdominal pain in various areas.  No fever or rash.  She reports her breathing is "pretty good".  Objective:  Vital signs in last 24 hours:  Blood pressure 136/61, pulse 99, temperature (!) 97.5 F (36.4 C), temperature source Oral, resp. rate 17, height 5' 1" (1.549 m), weight 174 lb 1.6 oz (79 kg), SpO2 100 %.    HEENT: No thrush or ulcers. Resp: Distant breath sounds.  No respiratory distress. Cardio: Regular rate and rhythm. GI: Abdomen is distended.  Mild diffuse tenderness.  Firmness throughout the mid and left abdomen. Vascular: Trace edema at the lower legs bilaterally.  Port-A-Cath without erythema.  Lab Results:  Lab Results  Component Value Date   WBC 6.5 05/15/2018   HGB 9.0 (L) 05/15/2018   HCT 30.3 (L) 05/15/2018   MCV 101.7 (H) 05/15/2018   PLT 355 05/15/2018   NEUTROABS 4.7 05/15/2018    Imaging:  No results found.  Medications: I have reviewed the patient's current medications.  Assessment/Plan: 1. 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 Significant improvement in malignant right pleural effusion.  Status post robotic-assisted laparoscopic hysterectomy with bilateral salpingoophorectomy, omentectomy, radical tumor debulking 12/29/2014. Per Dr. Serita Grit office note 01/14/2015 cytoreduction was optimal with residual disease remaining only in the 1 mm implants on the small intestine. 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  MSI-stable, mutation burden-4, BRAF rearrangement,ERBB4, KRAS G12D Cycle 1 adjuvant Taxol/carboplatin 01/20/2015 Cycle 2 adjuvant Taxol/carboplatin 02/10/2015 Cycle 3 adjuvant Taxol/carboplatin 03/10/2015 CA 125 on 1013 2016-42  CTs of the chest, abdomen, and pelvis 05/31/2015 with no evidence of progressive ovarian cancer  CTs of the chest, abdomen, and pelvis 08/07/2015 and 08/08/2015-no evidence of progressive ovarian cancer  Peritoneal studding noted at the time of the cholecystectomy procedure  08/09/2015 Elevated CA 125 10/07/2015  CT 10/07/2015 with stricturing at the sigmoid colon, constipation, and omental nodularity  Initiation of salvage weekly Taxol chemotherapy 10/13/2015  Taxol changed to every 2 weeks beginning 01/19/2016 due to painful neuropathy.  CT scans 07/19/2016 with no  acute findings. No features in the abdomen or pelvis to suggest recurrent disease.Stable mild fullness right intrarenal collecting system.  Elevated CA 125 treatment resumed with Taxol/Avastin on a 2 week schedule 09/27/2016  CT 02/12/2017-no evidence of carcinomatosis, no evidence of progressive metastatic disease  Taxol/Avastin continued every 2 weeks  CT 09/10/2017-increase in trace ascites, no evidence of progressive carcinomatosis, inflammatory changes at the lung bases with a new 3 mm right lower lobe nodule  Taxol/Avastin continued every 2 weeks  Progressive rise in the CA 125  Cycle 1 carboplatin 3-week schedule4/17/2019  Cycle 2 carboplatin 11/14/2017  Progressive malignant left pleural effusion June 2019  CT 01/07/2018-resolution of pulmonary nodule seen on prior examination. New small bilateral pleural effusions left greater than right with areas of atelectasis. Stable scattered mediastinal and hilar lymph nodes some partially calcified. Stable emphysematous changes and areas of pulmonary scarring. Small volume abdominal ascites but no omental or peritoneal surface disease. Small scattered mesenteric and retroperitoneal lymph nodes stable. Stable nodularity left adrenal gland.  Cycle 1 gemcitabine 01/09/2018 (day 1/day 8 schedule)  Cycle 2 gemcitabine 01/30/2018  Cycle 3 gemcitabine 02/21/2018  CT 03/03/2018-moderate free fluid in the abdomen and pelvis, thickened walls of small bowel loops in the right lower quadrant  Cycle 4 gemcitabine 03/13/2018  CT 03/03/2018-small bilateral pleural effusions, moderate ascites, small bowel wall thickening in the right lower  quadrant  Paracentesis 03/20/2018-negative cytology  Cycle5day 1 gemcitabine 04/03/2018  Cycle 6 gemcitabine 04/24/2018 2. COPD-followed by Dr. Lamonte Sakai. 3. Dyspnea secondary to COPD and the large right pleural effusion, status post therapeutic thoracentesis procedures 09/30/2014,10/09/2014, and 10/21/2014. Left thoracentesis 10/30/2014  Progressive left pleural effusion noted on chest x-ray 12/17/2017  Left thoracentesis 12/19/2017, cytology positive for metastatic adenocarcinoma 4. Left nephrectomy as a child 5. Delayed nausea following cycle 1 Taxol/carboplatin, Aloxi/Emend added with cycle 2 with improvement. 6. Right lower extremity edema, right calf pain 12/09/2014. Negative venous Doppler 12/09/2014. 7. Diffuse pruritus following cycle 1 adjuvant Taxol/carboplatin 01/20/2015, no rash, resolved with a steroid dose pack 8. Thrombocytopenia second to chemotherapy, the carboplatin was dose reduced with cycle 2 adjuvant Taxol/carboplatin 02/10/2015 9. Chemotherapy-induced peripheral neuropathy-painful peripheral neuropathy involving the feet 01/19/2016.  10. Admission with acute cholecystitis 08/07/2015, status post a cholecystectomy 08/09/2015 11. Admission 10/08/2015 with abdominal pain/constipation, improved with bowel rest and laxatives 12. Mild right hydronephrosis noted on the CT 10/07/2015. Renal ultrasound 12/16/2015 with no hydronephrosis noted.No hydronephrosis on CT 07/19/2016. 13. Anemia secondary to chronic disease and chemotherapy, status post a red cell transfusion 12/05/2017 14. Admission 02/22/2018 with increased dyspnea 15. Admission 03/23/2018 with a COPD flare  Disposition: Lynn Ramirez has completed 6 cycles of gemcitabine.  The CA125 is slowly increasing and she continues to require paracentesis procedures for symptomatic relief of malignant ascites.  Dr. Benay Spice recommends discontinuation of gemcitabine.  Other treatment options were reviewed at today's visit including  Doxil, topotecan, Etoposide.  Dr. Benay Spice recommends Doxil on a 4-week schedule.  We reviewed potential toxicities associated with Doxil including bone marrow toxicity, cardiotoxicity, nausea, mouth sores, hair loss, rash, diarrhea.  She agrees to proceed.  She had a 2D echo on 03/24/2018 which showed an ejection fraction in the range of 55 to 60%.  She will return for the first cycle of Doxil either later this week or early next week.  We will see her in follow-up 2 weeks after the treatment.  She will contact the office in the interim with any problems.  Patient seen with Dr. Benay Spice.  25 minutes  were spent face-to-face at today's visit with the majority of that time involved in counseling/coordination of care.    Ned Card ANP/GNP-BC   05/15/2018  12:55 PM  This is a shared visit with Ned Card.  Lynn Ramirez was interviewed and examined.  She has clinical evidence of disease progression on gemcitabine.  Gemcitabine will be discontinued.  She understands the chance of a clinical response with further salvage therapy is low.  She wishes to proceed with a trial of chemotherapy.  I recommend Doxil.  We reviewed potential toxicities associated with Doxil.  She agrees to proceed.  She will contact us if she feels in need of another therapeutic paracentesis.  Julieanne Manson, MD

## 2018-05-16 ENCOUNTER — Inpatient Hospital Stay: Payer: Medicare Other

## 2018-05-16 ENCOUNTER — Other Ambulatory Visit: Payer: Self-pay | Admitting: Oncology

## 2018-05-16 ENCOUNTER — Telehealth: Payer: Self-pay | Admitting: Oncology

## 2018-05-16 VITALS — BP 145/72 | HR 88 | Temp 98.1°F | Resp 17

## 2018-05-16 DIAGNOSIS — C569 Malignant neoplasm of unspecified ovary: Secondary | ICD-10-CM

## 2018-05-16 DIAGNOSIS — C5700 Malignant neoplasm of unspecified fallopian tube: Secondary | ICD-10-CM

## 2018-05-16 DIAGNOSIS — C482 Malignant neoplasm of peritoneum, unspecified: Secondary | ICD-10-CM

## 2018-05-16 LAB — VITAMIN D 25 HYDROXY (VIT D DEFICIENCY, FRACTURES): VIT D 25 HYDROXY: 31.7 ng/mL (ref 30.0–100.0)

## 2018-05-16 LAB — URINE CULTURE

## 2018-05-16 LAB — CA 125: Cancer Antigen (CA) 125: 409 U/mL — ABNORMAL HIGH (ref 0.0–38.1)

## 2018-05-16 MED ORDER — DEXAMETHASONE SODIUM PHOSPHATE 10 MG/ML IJ SOLN
INTRAMUSCULAR | Status: AC
  Filled 2018-05-16: qty 1

## 2018-05-16 MED ORDER — DEXTROSE 5 % IV SOLN
Freq: Once | INTRAVENOUS | Status: AC
  Administered 2018-05-16: 14:00:00 via INTRAVENOUS
  Filled 2018-05-16: qty 250

## 2018-05-16 MED ORDER — DEXAMETHASONE SODIUM PHOSPHATE 10 MG/ML IJ SOLN
10.0000 mg | Freq: Once | INTRAMUSCULAR | Status: AC
  Administered 2018-05-16: 10 mg via INTRAVENOUS

## 2018-05-16 MED ORDER — SODIUM CHLORIDE 0.9% FLUSH
10.0000 mL | INTRAVENOUS | Status: DC | PRN
  Administered 2018-05-16: 10 mL
  Filled 2018-05-16: qty 10

## 2018-05-16 MED ORDER — HEPARIN SOD (PORK) LOCK FLUSH 100 UNIT/ML IV SOLN
500.0000 [IU] | Freq: Once | INTRAVENOUS | Status: AC | PRN
  Administered 2018-05-16: 500 [IU]
  Filled 2018-05-16: qty 5

## 2018-05-16 MED ORDER — SODIUM CHLORIDE 0.9 % IV SOLN: 10.0000 mg | Freq: Once | INTRAVENOUS | Status: DC

## 2018-05-16 MED ORDER — DOXORUBICIN HCL LIPOSOMAL CHEMO INJECTION 2 MG/ML
32.6000 mg/m2 | Freq: Once | INTRAVENOUS | Status: AC
  Administered 2018-05-16: 60 mg via INTRAVENOUS
  Filled 2018-05-16: qty 30

## 2018-05-16 NOTE — Telephone Encounter (Signed)
Scheduled appt per 11/6 los - pt to get an updated schedule when she comes in today

## 2018-05-16 NOTE — Patient Instructions (Signed)
[facility] Discharge Instructions for Patients Receiving Chemotherapy  Today you received the following chemotherapy agents Doxil  To help prevent nausea and vomiting after your treatment, we encourage you to take your nausea medication as prescribed.  If you develop nausea and vomiting that is not controlled by your nausea medication, call the clinic.   BELOW ARE SYMPTOMS THAT SHOULD BE REPORTED IMMEDIATELY:  *FEVER GREATER THAN 100.5 F  *CHILLS WITH OR WITHOUT FEVER  NAUSEA AND VOMITING THAT IS NOT CONTROLLED WITH YOUR NAUSEA MEDICATION  *UNUSUAL SHORTNESS OF BREATH  *UNUSUAL BRUISING OR BLEEDING  TENDERNESS IN MOUTH AND THROAT WITH OR WITHOUT PRESENCE OF ULCERS  *URINARY PROBLEMS  *BOWEL PROBLEMS  UNUSUAL RASH Items with * indicate a potential emergency and should be followed up as soon as possible.  Feel free to call the clinic should you have any questions or concerns. The clinic phone number is [phone].  Please show the [place] at check-in to the Emergency Department and triage nurse.  Doxorubicin Liposomal injection (doxil) What is this medicine? LIPOSOMAL DOXORUBICIN (LIP oh som al dox oh ROO bi sin) is a chemotherapy drug. This medicine is used to treat many kinds of cancer like Kaposi's sarcoma, multiple myeloma, and ovarian cancer. This medicine may be used for other purposes; ask your health care provider or pharmacist if you have questions. COMMON BRAND NAME(S): Doxil, Lipodox What should I tell my health care provider before I take this medicine? They need to know if you have any of these conditions: -blood disorders -heart disease -infection (especially a virus infection such as chickenpox, cold sores, or herpes) -liver disease -recent or ongoing radiation therapy -an unusual or allergic reaction to doxorubicin, other chemotherapy agents, soybeans, other medicines, foods, dyes, or preservatives -pregnant or trying to get  pregnant -breast-feeding How should I use this medicine? This drug is given as an infusion into a vein. It is administered in a hospital or clinic by a specially trained health care professional. If you have pain, swelling, burning or any unusual feeling around the site of your injection, tell your health care professional right away. Talk to your pediatrician regarding the use of this medicine in children. Special care may be needed. Overdosage: If you think you have taken too much of this medicine contact a poison control center or emergency room at once. NOTE: This medicine is only for you. Do not share this medicine with others. What if I miss a dose? It is important not to miss your dose. Call your doctor or health care professional if you are unable to keep an appointment. What may interact with this medicine? Do not take this medicine with any of the following medications: -zidovudine This medicine may also interact with the following medications: -medicines to increase blood counts like filgrastim, pegfilgrastim, sargramostim -vaccines Talk to your doctor or health care professional before taking any of these medicines: -acetaminophen -aspirin -ibuprofen -ketoprofen -naproxen This list may not describe all possible interactions. Give your health care provider a list of all the medicines, herbs, non-prescription drugs, or dietary supplements you use. Also tell them if you smoke, drink alcohol, or use illegal drugs. Some items may interact with your medicine. What should I watch for while using this medicine? Your condition will be monitored carefully while you are receiving this medicine. You will need important blood work done while you are taking this medicine. This drug may make you feel generally unwell. This is not uncommon, as chemotherapy can  affect healthy cells as well as cancer cells. Report any side effects. Continue your course of treatment even though you feel ill unless  your doctor tells you to stop. Your urine may turn orange-red for a few days after your dose. This is not blood. If your urine is dark or brown, call your doctor. In some cases, you may be given additional medicines to help with side effects. Follow all directions for their use. Call your doctor or health care professional for advice if you get a fever (100.5 degrees F or higher), chills or sore throat, or other symptoms of a cold or flu. Do not treat yourself. This drug decreases your body's ability to fight infections. Try to avoid being around people who are sick. This medicine may increase your risk to bruise or bleed. Call your doctor or health care professional if you notice any unusual bleeding. Be careful brushing and flossing your teeth or using a toothpick because you may get an infection or bleed more easily. If you have any dental work done, tell your dentist you are receiving this medicine. Avoid taking products that contain aspirin, acetaminophen, ibuprofen, naproxen, or ketoprofen unless instructed by your doctor. These medicines may hide a fever. Men and women of childbearing age should use effective birth control methods while using taking this medicine. Do not become pregnant while taking this medicine. There is a potential for serious side effects to an unborn child. Talk to your health care professional or pharmacist for more information. Do not breast-feed an infant while taking this medicine. Talk to your doctor about your risk of cancer. You may be more at risk for certain types of cancers if you take this medicine. What side effects may I notice from receiving this medicine? Side effects that you should report to your doctor or health care professional as soon as possible: -allergic reactions like skin rash, itching or hives, swelling of the face, lips, or tongue -low blood counts - this medicine may decrease the number of white blood cells, red blood cells and platelets. You may  be at increased risk for infections and bleeding. -signs of hand-foot syndrome - tingling or burning, redness, flaking, swelling, small blisters, or small sores on the palms of your hands or the soles of your feet -signs of infection - fever or chills, cough, sore throat, pain or difficulty passing urine -signs of decreased platelets or bleeding - bruising, pinpoint red spots on the skin, black, tarry stools, blood in the urine -signs of decreased red blood cells - unusually weak or tired, fainting spells, lightheadedness -back pain, chills, facial flushing, fever, headache, tightness in the chest or throat during the infusion -breathing problems -chest pain -fast, irregular heartbeat -mouth pain, redness, sores -pain, swelling, redness at site where injected -pain, tingling, numbness in the hands or feet -swelling of ankles, feet, or hands -vomiting Side effects that usually do not require medical attention (report to your doctor or health care professional if they continue or are bothersome): -diarrhea -hair loss -loss of appetite -nail discoloration or damage -nausea -red or watery eyes -red colored urine -stomach upset This list may not describe all possible side effects. Call your doctor for medical advice about side effects. You may report side effects to FDA at 1-800-FDA-1088. Where should I keep my medicine? This drug is given in a hospital or clinic and will not be stored at home. NOTE: This sheet is a summary. It may not cover all possible information. If you have questions  about this medicine, talk to your doctor, pharmacist, or health care provider.  2018 [employer]vier/Gold Standard (2012-03-15 10:12:56)

## 2018-05-17 ENCOUNTER — Telehealth: Payer: Self-pay | Admitting: *Deleted

## 2018-05-17 NOTE — Telephone Encounter (Signed)
Chemo Follow Up Call: Patient reports no adverse effect from doxorubicin liposomal on 05/16/18. Mild nausea resolved w/Zofran. Took laxative today for constipation and it worked. Able to eat and drink. She understands to call for any problems or questions.

## 2018-05-20 ENCOUNTER — Encounter (HOSPITAL_COMMUNITY): Payer: Self-pay | Admitting: Emergency Medicine

## 2018-05-20 ENCOUNTER — Other Ambulatory Visit: Payer: Self-pay

## 2018-05-20 ENCOUNTER — Emergency Department (HOSPITAL_COMMUNITY)
Admission: EM | Admit: 2018-05-20 | Discharge: 2018-05-21 | Disposition: A | Discharge status: Home / Self Care | Payer: Medicare Other | Attending: Emergency Medicine | Admitting: Emergency Medicine

## 2018-05-20 DIAGNOSIS — Z780 Asymptomatic menopausal state: Secondary | ICD-10-CM | POA: Diagnosis not present

## 2018-05-20 DIAGNOSIS — I2510 Atherosclerotic heart disease of native coronary artery without angina pectoris: Secondary | ICD-10-CM | POA: Diagnosis not present

## 2018-05-20 DIAGNOSIS — Z8543 Personal history of malignant neoplasm of ovary: Secondary | ICD-10-CM | POA: Diagnosis not present

## 2018-05-20 DIAGNOSIS — N182 Chronic kidney disease, stage 2 (mild): Secondary | ICD-10-CM | POA: Diagnosis not present

## 2018-05-20 DIAGNOSIS — E785 Hyperlipidemia, unspecified: Secondary | ICD-10-CM | POA: Diagnosis not present

## 2018-05-20 DIAGNOSIS — I130 Hypertensive heart and chronic kidney disease with heart failure and stage 1 through stage 4 chronic kidney disease, or unspecified chronic kidney disease: Secondary | ICD-10-CM | POA: Diagnosis not present

## 2018-05-20 DIAGNOSIS — R109 Unspecified abdominal pain: Secondary | ICD-10-CM | POA: Diagnosis not present

## 2018-05-20 DIAGNOSIS — Z1231 Encounter for screening mammogram for malignant neoplasm of breast: Secondary | ICD-10-CM | POA: Diagnosis not present

## 2018-05-20 DIAGNOSIS — Z803 Family history of malignant neoplasm of breast: Secondary | ICD-10-CM | POA: Diagnosis not present

## 2018-05-20 DIAGNOSIS — Z91040 Latex allergy status: Secondary | ICD-10-CM | POA: Insufficient documentation

## 2018-05-20 DIAGNOSIS — I5032 Chronic diastolic (congestive) heart failure: Secondary | ICD-10-CM | POA: Insufficient documentation

## 2018-05-20 DIAGNOSIS — J439 Emphysema, unspecified: Secondary | ICD-10-CM | POA: Diagnosis not present

## 2018-05-20 DIAGNOSIS — Z87891 Personal history of nicotine dependence: Secondary | ICD-10-CM | POA: Diagnosis not present

## 2018-05-20 DIAGNOSIS — Z90710 Acquired absence of both cervix and uterus: Secondary | ICD-10-CM | POA: Diagnosis not present

## 2018-05-20 DIAGNOSIS — R112 Nausea with vomiting, unspecified: Secondary | ICD-10-CM

## 2018-05-20 DIAGNOSIS — Z79899 Other long term (current) drug therapy: Secondary | ICD-10-CM | POA: Insufficient documentation

## 2018-05-20 DIAGNOSIS — R634 Abnormal weight loss: Secondary | ICD-10-CM | POA: Diagnosis not present

## 2018-05-20 DIAGNOSIS — J449 Chronic obstructive pulmonary disease, unspecified: Secondary | ICD-10-CM | POA: Insufficient documentation

## 2018-05-20 DIAGNOSIS — E349 Endocrine disorder, unspecified: Secondary | ICD-10-CM | POA: Diagnosis not present

## 2018-05-20 LAB — CBC WITH DIFFERENTIAL/PLATELET
ABS IMMATURE GRANULOCYTES: 0.05 10*3/uL (ref 0.00–0.07)
Basophils Absolute: 0 10*3/uL (ref 0.0–0.1)
Basophils Relative: 0 %
EOS ABS: 0.1 10*3/uL (ref 0.0–0.5)
Eosinophils Relative: 1 %
HEMATOCRIT: 31.5 % — AB (ref 36.0–46.0)
Hemoglobin: 9.2 g/dL — ABNORMAL LOW (ref 12.0–15.0)
IMMATURE GRANULOCYTES: 1 %
LYMPHS ABS: 0.6 10*3/uL — AB (ref 0.7–4.0)
Lymphocytes Relative: 9 %
MCH: 29.4 pg (ref 26.0–34.0)
MCHC: 29.2 g/dL — ABNORMAL LOW (ref 30.0–36.0)
MCV: 100.6 fL — AB (ref 80.0–100.0)
MONOS PCT: 8 %
Monocytes Absolute: 0.5 10*3/uL (ref 0.1–1.0)
NEUTROS ABS: 5.6 10*3/uL (ref 1.7–7.7)
NEUTROS PCT: 81 %
Platelets: 399 10*3/uL (ref 150–400)
RBC: 3.13 MIL/uL — ABNORMAL LOW (ref 3.87–5.11)
RDW: 18.3 % — ABNORMAL HIGH (ref 11.5–15.5)
WBC: 6.8 10*3/uL (ref 4.0–10.5)
nRBC: 0 % (ref 0.0–0.2)

## 2018-05-20 LAB — BASIC METABOLIC PANEL
ANION GAP: 10 (ref 5–15)
BUN: 19 mg/dL (ref 8–23)
CALCIUM: 8.2 mg/dL — AB (ref 8.9–10.3)
CHLORIDE: 92 mmol/L — AB (ref 98–111)
CO2: 36 mmol/L — AB (ref 22–32)
CREATININE: 0.99 mg/dL (ref 0.44–1.00)
GFR calc non Af Amer: 54 mL/min — ABNORMAL LOW (ref >60)
Glucose, Bld: 105 mg/dL — ABNORMAL HIGH (ref 70–99)
Potassium: 3.2 mmol/L — ABNORMAL LOW (ref 3.5–5.1)
SODIUM: 138 mmol/L (ref 135–145)

## 2018-05-20 LAB — I-STAT CG4 LACTIC ACID, ED: LACTIC ACID, VENOUS: 0.82 mmol/L (ref 0.5–1.9)

## 2018-05-20 LAB — HM DEXA SCAN: HM DEXA SCAN: NORMAL

## 2018-05-20 LAB — HM MAMMOGRAPHY

## 2018-05-20 MED ORDER — SODIUM CHLORIDE 0.9 % IV BOLUS
500.0000 mL | Freq: Once | INTRAVENOUS | Status: AC
  Administered 2018-05-20: 500 mL via INTRAVENOUS

## 2018-05-20 MED ORDER — PROMETHAZINE HCL 25 MG/ML IJ SOLN
12.5000 mg | Freq: Once | INTRAMUSCULAR | Status: AC
  Administered 2018-05-20: 12.5 mg via INTRAVENOUS
  Filled 2018-05-20: qty 1

## 2018-05-20 MED ORDER — MORPHINE SULFATE (PF) 4 MG/ML IV SOLN
4.0000 mg | Freq: Once | INTRAVENOUS | Status: AC
  Administered 2018-05-20: 4 mg via INTRAVENOUS
  Filled 2018-05-20: qty 1

## 2018-05-20 NOTE — ED Notes (Signed)
Bed: WA04 Expected date:  Expected time:  Means of arrival:  Comments: Triage 2- Laney Pastor

## 2018-05-20 NOTE — ED Triage Notes (Signed)
Pt arriving POV with weakness and N/V x2 days. Last chemo treatment was last Thursday. Pt reports taking zofran at home for nausea, last dose was at 5:45 with little relief. Pt still throwing up bile at this time. Pt on 2L O2 via nasal canula, wears at home.

## 2018-05-20 NOTE — ED Notes (Signed)
Pt is requesting pain medication. 

## 2018-05-21 ENCOUNTER — Emergency Department (HOSPITAL_COMMUNITY): Payer: Medicare Other

## 2018-05-21 DIAGNOSIS — R112 Nausea with vomiting, unspecified: Secondary | ICD-10-CM | POA: Diagnosis not present

## 2018-05-21 LAB — URINALYSIS, ROUTINE W REFLEX MICROSCOPIC
Bilirubin Urine: NEGATIVE
Glucose, UA: NEGATIVE mg/dL
Hgb urine dipstick: NEGATIVE
Ketones, ur: NEGATIVE mg/dL
Leukocytes, UA: NEGATIVE
Nitrite: NEGATIVE
Protein, ur: NEGATIVE mg/dL
Specific Gravity, Urine: 1.018 (ref 1.005–1.030)
pH: 5 (ref 5.0–8.0)

## 2018-05-21 MED ORDER — HEPARIN SOD (PORK) LOCK FLUSH 100 UNIT/ML IV SOLN
500.0000 [IU] | Freq: Once | INTRAVENOUS | Status: AC
  Administered 2018-05-21: 500 [IU]
  Filled 2018-05-21: qty 5

## 2018-05-21 MED ORDER — LORAZEPAM 2 MG/ML IJ SOLN
1.0000 mg | Freq: Once | INTRAMUSCULAR | Status: AC
  Administered 2018-05-21: 1 mg via INTRAVENOUS
  Filled 2018-05-21: qty 1

## 2018-05-21 MED ORDER — ONDANSETRON HCL 4 MG/2ML IJ SOLN
4.0000 mg | Freq: Once | INTRAMUSCULAR | Status: AC
  Administered 2018-05-21: 4 mg via INTRAVENOUS
  Filled 2018-05-21: qty 2

## 2018-05-21 MED ORDER — METOCLOPRAMIDE HCL 5 MG/ML IJ SOLN
10.0000 mg | Freq: Once | INTRAMUSCULAR | Status: AC
  Administered 2018-05-21: 10 mg via INTRAVENOUS
  Filled 2018-05-21: qty 2

## 2018-05-21 MED ORDER — METOCLOPRAMIDE HCL 10 MG PO TABS: 10.0000 mg | ORAL_TABLET | Freq: Four times a day (QID) | ORAL | 0 refills | Status: DC

## 2018-05-21 MED ORDER — IOHEXOL 300 MG/ML  SOLN
30.0000 mL | Freq: Once | INTRAMUSCULAR | Status: AC | PRN
  Administered 2018-05-21: 30 mL via INTRAVENOUS

## 2018-05-21 NOTE — ED Notes (Signed)
Urine and culture sent to lab  

## 2018-05-21 NOTE — Discharge Instructions (Addendum)
REturn here as needed. Follow up with your oncologist. You will need to discuss having the fluid drained.

## 2018-05-21 NOTE — ED Notes (Signed)
Pt given 4 oz of water for PO challenge 

## 2018-05-21 NOTE — ED Notes (Addendum)
Pt had two episodes of emesis and c/o severe nausea. Gerald Stabs, [place] made aware and at bedside

## 2018-05-21 NOTE — ED Notes (Addendum)
Pt is unable to keep the oral contrast down and had two more episodes of emesis. PA made aware

## 2018-05-21 NOTE — ED Notes (Signed)
Patient transported to CT 

## 2018-05-22 ENCOUNTER — Other Ambulatory Visit: Payer: Medicare Other

## 2018-05-22 ENCOUNTER — Ambulatory Visit: Payer: Medicare Other

## 2018-05-22 NOTE — ED Provider Notes (Signed)
[facility] DEPT Provider Note   CSN: [id number] Arrival date & time: 05/20/18  2049     History   Chief Complaint Chief Complaint  Patient presents with  . Nausea  . Emesis    HPI Lynn Ramirez is a 77 y.o. female.  HPI Patient presents to the emergency department with pain that started earlier today.  She is also had some abdominal discomfort as well.  Patient states that she has had fluid drained from her abdomen every few weeks.  The patient is currently being treated for ovarian cancer.  Patient states that her Zofran was not helping with the nausea and vomiting.  Patient states she did not take any other medications prior to arrival.  The patient denies chest pain, shortness of breath, headache,blurred vision, neck pain, fever, cough, weakness, numbness, dizziness, anorexia, edema,  diarrhea, rash, back pain, dysuria, hematemesis, bloody stool, near syncope, or syncope. Past Medical History:  Diagnosis Date  . CKD (chronic kidney disease), stage II   . COPD (chronic obstructive pulmonary disease) ([place])   . Coronary artery calcification seen on CT scan   . Difficulty sleeping   . Eczema    hands  . Emphysema   . GERD (gastroesophageal reflux disease)   . H/O hydronephrosis   . History of transfusion    age 34  . Hyperlipidemia   . Hypertension   . Ovarian cancer ([place]) dx'd 09/2014   metastatic - prior malignant R pleural effusion and peritoneal carcinomatosis  . S/p nephrectomy     Patient Active Problem List   Diagnosis Date Noted  . Abdominal pain 05/07/2018  . Abnormal finding on lung imaging 04/01/2018  . PNA (pneumonia) 03/23/2018  . Normocytic anemia 02/22/2018  . Hypokalemia 02/22/2018  . On home oxygen therapy 02/22/2018  . Left leg pain 07/16/2017  . S/p nephrectomy   . History of transfusion   . H/O hydronephrosis   . GERD (gastroesophageal reflux disease)   . Eczema   . Difficulty sleeping   . Coronary artery  calcification seen on CT scan   . CKD (chronic kidney disease), stage II   . Chronic diastolic CHF (congestive heart failure) ([place]) 04/26/2017  . Cardiomyopathy ([place]) 04/26/2017  . Mixed incontinence urge and stress 01/31/2017  . Chronic cough 01/15/2017  . Patient on antineoplastic chemotherapy regimen 10/18/2016  . Pain and swelling of right lower extremity 10/02/2016  . Goals of care, counseling/discussion 09/26/2016  . Essential hypertension 08/07/2016  . B12 deficiency 08/07/2016  . Vitamin D deficiency 08/07/2016  . Diarrhea 07/06/2016  . Cramping of hands 07/06/2016  . Port catheter in place 11/02/2015  . Allergic rhinitis 11/02/2015  . Constipation   . Sigmoid stricture ([place])   . Obstipation 10/08/2015  . COPD GOLD III D   . Pulmonary nodules/lesions, multiple 08/08/2015  . Chronic respiratory failure with hypoxia ([place]) 06/20/2015  . SOB (shortness of breath) 06/16/2015  . CAD (coronary artery disease) 03/31/2015  . Hyperlipidemia 03/31/2015  . Claudication ([place]) 03/31/2015  . Chemotherapy-induced neuropathy ([place]) 03/25/2015  . Genetic testing 02/19/2015  . Malignant neoplasm of fallopian tube ([place]) 02/19/2015  . Family history of pancreatic cancer 02/19/2015  . Family history of breast cancer in female 02/19/2015  . Ovarian cancer ([place]) 12/29/2014  . Malignant ascites 10/30/2014  . Primary peritoneal carcinomatosis ([place]) 10/23/2014  . Metastatic adenocarcinoma ([place]) 10/21/2014  . Insomnia 10/11/2014  . Pelvic mass in female 10/05/2014  . Malignant pleural effusion 09/29/2014  . Encopresis 06/18/2014  Past Surgical History:  Procedure Laterality Date  . ABDOMINAL HYSTERECTOMY    . CHOLECYSTECTOMY N/A 08/09/2015   Procedure: Attempted LAPAROSCOPIC coverted  open CHOLECYSTECTOMY WITH INTRAOPERATIVE CHOLANGIOGRAM;  Surgeon: Autumn Messing III, MD;  Location: [facility] ORS;  Service: General;  Laterality: N/A;  . HAND SURGERY Right 1980's  . LAPAROTOMY N/A 12/29/2014    Procedure:  LAPAROTOMY;  Surgeon: Everitt Amber, MD;  Location: [facility];  Service: Gynecology;  Laterality: N/A;  . NEPHRECTOMY    . ROBOTIC ASSISTED TOTAL HYSTERECTOMY WITH BILATERAL SALPINGO OOPHERECTOMY Bilateral 12/29/2014   Procedure: ROBOTIC ASSISTED TOTAL LAPAROSCOPIC HYSTERECTOMY WITH BILATERAL SALPINGO OOPHORECTOMY AND OOMENTECTOMY WITH RADICAL TUMOR [place] ;  Surgeon: Everitt Amber, MD;  Location: [facility];  Service: Gynecology;  Laterality: Bilateral;  . THORACENTESIS     several  . TONSILLECTOMY    . TUBAL LIGATION       OB History   None      Home Medications    Prior to Admission medications   Medication Sig Start Date End Date Taking? Authorizing Provider  acetaminophen (TYLENOL) 650 MG CR tablet Take 650 mg by mouth every 8 (eight) hours as needed for pain.   Yes [provider]  albuterol (PROVENTIL) (2.5 MG/3ML) 0.083% nebulizer solution INHALE THE CONTENTS OF 1 VIAL VIA NEBULIZER EVERY 6 HOURS AS NEEDED FOR WHEEZING OR SHORTNESS OF BREATH Patient taking differently: Take 2.5 mg by nebulization every 6 (six) hours as needed for wheezing or shortness of breath.  02/25/18  Yes Collene Gobble, MD  antiseptic oral rinse (BIOTENE) LIQD 15 mLs by Mouth Rinse route 3 (three) times daily.   Yes [provider]  atorvastatin (LIPITOR) 10 MG tablet TAKE 1 TABLET(10 MG) BY MOUTH DAILY Patient taking differently: Take 10 mg by mouth daily at 6 PM.  08/24/17  Yes Dorothy Spark, MD  benzonatate (TESSALON) 200 MG capsule Take 1 capsule (200 mg total) by mouth 3 (three) times daily as needed for cough. 04/01/18  Yes Lauraine Rinne, NP  Biotin 2500 MCG CAPS Take 1 tablet by mouth daily.   Yes [provider]  carvedilol (COREG) 3.125 MG tablet TAKE 1 TABLET(3.125 MG) BY MOUTH TWICE DAILY WITH A MEAL Patient taking differently: Take 3.125 mg by mouth 2 (two) times daily with a meal.  11/27/17  Yes Dorothy Spark, MD  cholecalciferol (VITAMIN D) 1000 units  tablet Take 1,000 Units by mouth daily.   Yes [provider]  diphenoxylate-atropine (LOMOTIL) 2.5-0.025 MG tablet TAKE 2 TABLETS BY MOUTH FOUR TIMES DAILY AS NEEDED FOR DIARRHEA/ LOOSE STOOLS 08/03/17  Yes Ladell Pier, MD  esomeprazole (NEXIUM) 40 MG capsule Take 40 mg by mouth daily at 12 noon.   Yes [provider]  Fluticasone Furoate (ARNUITY ELLIPTA) 200 MCG/ACT AEPB Inhale 1 puff into the lungs daily. 04/01/18  Yes Collene Gobble, MD  furosemide (LASIX) 40 MG tablet Take 1 tablet (40 mg total) by mouth daily. 12/10/17  Yes Dorothy Spark, MD  gabapentin (NEURONTIN) 300 MG capsule TAKE 1 CAPSULE BY MOUTH EVERY NIGHT AT BEDTIME 03/07/18  Yes Ladell Pier, MD  hydrOXYzine (VISTARIL) 25 MG capsule Take 1 capsule (25 mg total) by mouth 4 (four) times daily as needed for itching. 01/03/18  Yes Ladell Pier, MD  levothyroxine (SYNTHROID, LEVOTHROID) 50 MCG tablet Take 1 tablet (50 mcg total) by mouth daily. 04/03/18  Yes Midge Minium, MD  lidocaine-prilocaine (EMLA) cream Apply to port site one hour  prior to use. Do not rub in. Cover with plastic. 10/12/16  Yes Ladell Pier, MD  loratadine (CLARITIN) 10 MG tablet Take 10 mg by mouth daily.   Yes [provider]  ondansetron (ZOFRAN ODT) 4 MG disintegrating tablet Take 1 tablet (4 mg total) by mouth every 4 (four) hours as needed for nausea or vomiting. 05/05/18  Yes Charlesetta Shanks, MD  oxyCODONE (ROXICODONE) 5 MG immediate release tablet Take 1 tablet (5 mg total) by mouth every 6 (six) hours as needed for moderate pain or severe pain. 05/05/18  Yes Charlesetta Shanks, MD  oxyCODONE-acetaminophen (PERCOCET/ROXICET) 5-325 MG tablet Take 1 tablet by mouth every 6 (six) hours as needed for severe pain. 08/15/17  Yes Ladell Pier, MD  Potassium Chloride ER 20 MEQ TBCR TAKE 1/2 TABLET BY MOUTH DAILY Patient taking differently: Take 10 mg by mouth daily.  03/19/18  Yes Ladell Pier, MD  PROAIR HFA 108 [phone]  Base) MCG/ACT inhaler INHALE 2 PUFFS INTO THE LUNGS FOUR TIMES DAILY AS NEEDED FOR WHEEZING Patient taking differently: Inhale 2 puffs into the lungs 4 (four) times daily as needed for wheezing.  03/13/17  Yes Collene Gobble, MD  pyridoxine (B-6) 100 MG tablet Take 100 mg by mouth daily.   Yes [provider]  STIOLTO RESPIMAT 2.5-2.5 MCG/ACT AERS INHALE 2 PUFFS INTO THE LUNGS DAILY Patient taking differently: Take 2 puffs by mouth daily.  09/11/17  Yes Collene Gobble, MD  traMADol (ULTRAM) 50 MG tablet Take 1 tablet (50 mg total) by mouth every 12 (twelve) hours as needed. for pain 09/26/17  Yes Ladell Pier, MD  vitamin B-12 (CYANOCOBALAMIN) 100 MCG tablet Take 100 mcg by mouth daily. Reported on 01/19/2016   Yes [provider]  zolpidem (AMBIEN) 5 MG tablet Take 1 tablet (5 mg total) by mouth at bedtime as needed for sleep. 08/01/17  Yes [name], NP  ciprofloxacin (CIPRO) 500 MG tablet Take 1 tablet (500 mg total) by mouth 2 (two) times daily. Patient not taking: Reported on 05/21/2018 05/05/18   Charlesetta Shanks, MD  metoCLOPramide (REGLAN) 10 MG tablet Take 1 tablet (10 mg total) by mouth every 6 (six) hours. 05/21/18   Remus Hagedorn, Harrell Gave, PA-C  ondansetron (ZOFRAN-ODT) 4 MG disintegrating tablet Take 1 tablet (4 mg total) by mouth every 8 (eight) hours as needed for nausea or vomiting. Patient not taking: Reported on 05/21/2018 12/05/17   Ladell Pier, MD  OXYGEN Inhale 2 L into the lungs.    [provider]    Family History Family History  Problem Relation Age of Onset  . Diabetes Father   . Lung cancer Father 73       metastasis to liver  . Cancer Father        liver  . Pancreatic cancer Mother 35  . Stroke Paternal Grandfather   . Heart disease Maternal Aunt   . Diabetes Paternal Uncle   . Heart Problems Maternal Grandmother   . Heart Problems Maternal Grandfather   . Diabetes Paternal Grandmother   . Stroke Paternal Grandmother   . Heart  Problems Paternal Uncle   . Cancer Cousin        unknown type  . Breast cancer Cousin        dx. 37s  . Leukemia Cousin        dx. 16-17  . Colon cancer Neg Hx   . Stomach cancer Neg Hx     Social History  Social History   Tobacco Use  . Smoking status: Former Smoker    Packs/day: 1.00    Years: 40.00    Pack years: 40.00    Types: Cigarettes    Last attempt to quit: 07/10/2013    Years since quitting: 4.8  . Smokeless tobacco: Never Used  Substance Use Topics  . Alcohol use: Yes    Alcohol/week: 0.0 standard drinks    Comment: 1-2 a week  . Drug use: No     Allergies   Latex and Penicillins   Review of Systems Review of Systems  All other systems negative except as documented in the HPI. All pertinent positives and negatives as reviewed in the HPI. Physical Exam Updated Vital Signs BP 112/86   Pulse 96   Temp 98.4 F (36.9 C) (Oral)   Resp (!) 22   Ht 5\' 1"  (1.549 m)   Wt 79 kg   SpO2 98%   BMI 32.91 kg/m   Physical Exam  Constitutional: She is oriented to person, place, and time. She appears well-developed and well-nourished. No distress.  HENT:  Head: Normocephalic and atraumatic.  Mouth/Throat: Oropharynx is clear and moist.  Eyes: Pupils are equal, round, and reactive to light.  Neck: Normal range of motion. Neck supple.  Cardiovascular: Normal rate, regular rhythm and normal heart sounds. Exam reveals no gallop and no friction rub.  No murmur heard. Pulmonary/Chest: Effort normal and breath sounds normal. No respiratory distress. She has no wheezes.  Abdominal: Soft. Bowel sounds are normal. She exhibits no distension and no mass. There is tenderness. There is no rebound and no guarding.  Neurological: She is alert and oriented to person, place, and time. She exhibits normal muscle tone. Coordination normal.  Skin: Skin is warm and dry. Capillary refill takes less than 2 seconds. No rash noted. No erythema.  Psychiatric: She has a normal mood and  affect. Her behavior is normal.  Nursing note and vitals reviewed.    ED Treatments / Results  Labs (all labs ordered are listed, but only abnormal results are displayed) Labs Reviewed  CBC WITH DIFFERENTIAL/PLATELET - Abnormal; Notable for the following components:      Result Value   RBC 3.13 (*)    Hemoglobin 9.2 (*)    HCT 31.5 (*)    MCV 100.6 (*)    MCHC 29.2 (*)    RDW 18.3 (*)    Lymphs Abs 0.6 (*)    All other components within normal limits  BASIC METABOLIC PANEL - Abnormal; Notable for the following components:   Potassium 3.2 (*)    Chloride 92 (*)    CO2 36 (*)    Glucose, Bld 105 (*)    Calcium 8.2 (*)    GFR calc non Af Amer 54 (*)    All other components within normal limits  URINALYSIS, ROUTINE W REFLEX MICROSCOPIC  I-STAT CG4 LACTIC ACID, ED    EKG None  Radiology Ct Abdomen Pelvis Wo Contrast  Result Date: 05/21/2018 CLINICAL DATA:  Acute onset of nausea, vomiting and generalized weakness. EXAM: CT ABDOMEN AND PELVIS WITHOUT CONTRAST TECHNIQUE: Multidetector CT imaging of the abdomen and pelvis was performed following the standard protocol without IV contrast. COMPARISON:  CT of the abdomen and pelvis performed 05/05/2018 FINDINGS: Lower chest: A small left pleural effusion is noted, with associated atelectasis. Trace right-sided pleural fluid is seen. Scattered coronary artery calcifications are seen. Hepatobiliary: Mildly complex perihepatic ascites is again noted. The liver is grossly stable  in appearance. The patient is status post cholecystectomy, with clips noted at the gallbladder fossa. Pancreas: The pancreas is within normal limits. Spleen: The spleen is unremarkable in appearance. Adrenals/Urinary Tract: The adrenal glands are unremarkable in appearance. The patient is status post left-sided nephrectomy. A tiny hyperdense cyst is noted at the lower pole of the right kidney. Minimal right-sided hydronephrosis may reflect the patient's baseline. No  renal or ureteral stones are identified. Nonspecific perinephric stranding is noted about the right kidney. Stomach/Bowel: The stomach is unremarkable in appearance. The small bowel is within normal limits. The appendix is not visualized; there is no evidence for appendicitis. The colon is unremarkable in appearance. Vascular/Lymphatic: Diffuse calcification is seen along the abdominal aorta and its branches. The abdominal aorta is otherwise grossly unremarkable. The inferior vena cava is grossly unremarkable. No retroperitoneal lymphadenopathy is seen. No pelvic sidewall lymphadenopathy is identified. Reproductive: The bladder is mildly distended. Apparent bladder wall thickening may reflect relative decompression. The patient is status post hysterectomy. No suspicious adnexal masses are seen. Other: Prominent somewhat loculated ascites is seen within the abdomen and pelvis, tracking about small bowel loops and along the lesser curvature of the stomach. Musculoskeletal: No acute osseous abnormalities are identified. The visualized musculature is unremarkable in appearance. IMPRESSION: 1. Prominent somewhat loculated ascites within the abdomen and pelvis, tracking about small bowel loops and along the lesser curvature of the stomach. 2. Mildly complex perihepatic ascites again noted. 3. Small left pleural effusion, with associated atelectasis. Trace right-sided pleural fluid seen. 4. Scattered coronary artery calcifications. 5. Tiny hyperdense cyst at the lower pole of the right kidney. Minimal chronic right-sided hydronephrosis may reflect the patient's baseline. Aortic Atherosclerosis (ICD10-I70.0). Electronically Signed   By: Garald Balding M.D.   On: 05/21/2018 04:04    Procedures Procedures (including critical care time)  Medications Ordered in ED Medications  sodium chloride 0.9 % bolus 500 mL (0 mLs Intravenous Stopped 05/21/18 0055)  promethazine (PHENERGAN) injection 12.5 mg (12.5 mg Intravenous  Given 05/20/18 2300)  morphine 4 MG/ML injection 4 mg (4 mg Intravenous Given 05/20/18 2303)  ondansetron (ZOFRAN) injection 4 mg (4 mg Intravenous Given 05/21/18 0056)  LORazepam (ATIVAN) injection 1 mg (1 mg Intravenous Given 05/21/18 0145)  iohexol (OMNIPAQUE) 300 MG/ML solution 30 mL (30 mLs Intravenous Contrast Given 05/21/18 0221)  metoCLOPramide (REGLAN) injection 10 mg (10 mg Intravenous Given 05/21/18 0330)  heparin lock flush 100 unit/mL (500 Units Intracatheter Given 05/21/18 4166)     Initial Impression / Assessment and Plan / ED Course  I have reviewed the triage vital signs and the nursing notes.  Pertinent labs & imaging results that were available during my care of the patient were reviewed by me and considered in my medical decision making (see chart for details).     Patient is feeling better following the last round of antiemetics and is able to tolerate oral fluids.  Patient would like to be discharged home I did advise her that she will need follow-up to have the fluid drained.  Patient agrees the plan and all questions were answered.  Patient has been otherwise stable here in the emergency department.  Final Clinical Impressions(s) / ED Diagnoses   Final diagnoses:  Non-intractable vomiting with nausea, unspecified vomiting type    ED Discharge Orders         Ordered    metoCLOPramide (REGLAN) 10 MG tablet  Every 6 hours     05/21/18 0607  Dalia Heading, PA-C 05/22/18 0636    Molpus, Jenny Reichmann, MD 05/22/18 [phone]

## 2018-05-23 ENCOUNTER — Telehealth: Payer: Self-pay | Admitting: Emergency Medicine

## 2018-05-23 ENCOUNTER — Inpatient Hospital Stay (HOSPITAL_BASED_OUTPATIENT_CLINIC_OR_DEPARTMENT_OTHER): Payer: Medicare Other | Admitting: Medical

## 2018-05-23 ENCOUNTER — Telehealth: Payer: Self-pay | Admitting: *Deleted

## 2018-05-23 ENCOUNTER — Other Ambulatory Visit: Payer: Self-pay | Admitting: Emergency Medicine

## 2018-05-23 ENCOUNTER — Ambulatory Visit: Payer: Medicare Other

## 2018-05-23 VITALS — BP 133/77 | HR 99 | Temp 97.9°F | Resp 18 | Ht 61.0 in | Wt 174.5 lb

## 2018-05-23 DIAGNOSIS — C786 Secondary malignant neoplasm of retroperitoneum and peritoneum: Secondary | ICD-10-CM

## 2018-05-23 DIAGNOSIS — C562 Malignant neoplasm of left ovary: Secondary | ICD-10-CM | POA: Diagnosis not present

## 2018-05-23 DIAGNOSIS — C561 Malignant neoplasm of right ovary: Secondary | ICD-10-CM | POA: Diagnosis not present

## 2018-05-23 DIAGNOSIS — Z79899 Other long term (current) drug therapy: Secondary | ICD-10-CM

## 2018-05-23 DIAGNOSIS — K30 Functional dyspepsia: Secondary | ICD-10-CM | POA: Diagnosis not present

## 2018-05-23 DIAGNOSIS — R112 Nausea with vomiting, unspecified: Secondary | ICD-10-CM | POA: Diagnosis not present

## 2018-05-23 DIAGNOSIS — R180 Malignant ascites: Secondary | ICD-10-CM

## 2018-05-23 DIAGNOSIS — Z95828 Presence of other vascular implants and grafts: Secondary | ICD-10-CM

## 2018-05-23 DIAGNOSIS — C5700 Malignant neoplasm of unspecified fallopian tube: Secondary | ICD-10-CM

## 2018-05-23 MED ORDER — DEXAMETHASONE SODIUM PHOSPHATE 10 MG/ML IJ SOLN
10.0000 mg | Freq: Once | INTRAMUSCULAR | Status: AC
  Administered 2018-05-23: 10 mg via INTRAVENOUS

## 2018-05-23 MED ORDER — HEPARIN SOD (PORK) LOCK FLUSH 100 UNIT/ML IV SOLN
500.0000 [IU] | Freq: Once | INTRAVENOUS | Status: AC
  Administered 2018-05-23: 500 [IU]
  Filled 2018-05-23: qty 5

## 2018-05-23 MED ORDER — SODIUM CHLORIDE 0.9 % IV SOLN
INTRAVENOUS | Status: AC
  Administered 2018-05-23: 13:00:00 via INTRAVENOUS
  Filled 2018-05-23 (×2): qty 250

## 2018-05-23 MED ORDER — ALUM & MAG HYDROXIDE-SIMETH 200-200-20 MG/5ML PO SUSP
ORAL | Status: AC
  Filled 2018-05-23: qty 30

## 2018-05-23 MED ORDER — SODIUM CHLORIDE 0.9% FLUSH
10.0000 mL | Freq: Once | INTRAVENOUS | Status: AC
  Administered 2018-05-23: 10 mL
  Filled 2018-05-23: qty 10

## 2018-05-23 MED ORDER — PROCHLORPERAZINE EDISYLATE 10 MG/2ML IJ SOLN
INTRAMUSCULAR | Status: AC
  Filled 2018-05-23: qty 2

## 2018-05-23 MED ORDER — PROCHLORPERAZINE EDISYLATE 10 MG/2ML IJ SOLN
10.0000 mg | Freq: Once | INTRAMUSCULAR | Status: AC
  Administered 2018-05-23: 10 mg via INTRAVENOUS
  Filled 2018-05-23: qty 2

## 2018-05-23 MED ORDER — DEXAMETHASONE SODIUM PHOSPHATE 10 MG/ML IJ SOLN
INTRAMUSCULAR | Status: AC
  Filled 2018-05-23: qty 1

## 2018-05-23 MED ORDER — SODIUM CHLORIDE 0.9 % IV SOLN: 10.0000 mg | Freq: Once | INTRAVENOUS | Status: DC

## 2018-05-23 MED ORDER — PROCHLORPERAZINE MALEATE 10 MG PO TABS: 10.0000 mg | ORAL_TABLET | Freq: Four times a day (QID) | ORAL | 1 refills | Status: AC | PRN

## 2018-05-23 MED ORDER — ALUM & MAG HYDROXIDE-SIMETH 200-200-20 MG/5ML PO SUSP
30.0000 mL | ORAL | Status: AC
  Administered 2018-05-23: 30 mL via ORAL

## 2018-05-23 NOTE — Telephone Encounter (Signed)
Spoke to patient. She will come in today to see [facility] to address her current symptoms.

## 2018-05-23 NOTE — Progress Notes (Signed)
Pt reports no nausea after admin anti-nausea medications, improvement in fatigue.  Able to eat a snack and rest.  Pa aware.

## 2018-05-23 NOTE — Telephone Encounter (Signed)
Called to report constant nausea-not able to eat and unresponsive to her Zofran which usually works. Vomits almost every day. Increased heartburn. Abdomen swelling. Bowels and urine OK. Asking to be seen today.

## 2018-05-23 NOTE — Patient Instructions (Signed)

## 2018-05-23 NOTE — Progress Notes (Signed)
indigestion-"My indigestion is back". Denies any other symptoms including chest pain ,neck , arm or  jaw pain. Per Lucianne Lei tanner , Maalox given.

## 2018-05-24 ENCOUNTER — Other Ambulatory Visit: Payer: Self-pay | Admitting: *Deleted

## 2018-05-24 ENCOUNTER — Inpatient Hospital Stay (HOSPITAL_BASED_OUTPATIENT_CLINIC_OR_DEPARTMENT_OTHER): Payer: Medicare Other | Admitting: Oncology

## 2018-05-24 ENCOUNTER — Ambulatory Visit (HOSPITAL_COMMUNITY)
Admission: RE | Admit: 2018-05-24 | Discharge: 2018-05-24 | Disposition: A | Discharge status: Home / Self Care | Payer: Medicare Other | Source: Ambulatory Visit | Attending: Oncology | Admitting: Oncology

## 2018-05-24 ENCOUNTER — Ambulatory Visit (HOSPITAL_COMMUNITY)
Admission: RE | Admit: 2018-05-24 | Discharge: 2018-05-24 | Disposition: A | Discharge status: Home / Self Care | Payer: Medicare Other | Source: Ambulatory Visit | Attending: Medical | Admitting: Medical

## 2018-05-24 VITALS — BP 140/65 | HR 70 | Temp 97.9°F | Resp 18 | Ht 61.0 in | Wt 170.1 lb

## 2018-05-24 DIAGNOSIS — C786 Secondary malignant neoplasm of retroperitoneum and peritoneum: Secondary | ICD-10-CM

## 2018-05-24 DIAGNOSIS — R112 Nausea with vomiting, unspecified: Secondary | ICD-10-CM | POA: Diagnosis not present

## 2018-05-24 DIAGNOSIS — C5700 Malignant neoplasm of unspecified fallopian tube: Secondary | ICD-10-CM | POA: Diagnosis not present

## 2018-05-24 DIAGNOSIS — Z79899 Other long term (current) drug therapy: Secondary | ICD-10-CM | POA: Diagnosis not present

## 2018-05-24 DIAGNOSIS — J90 Pleural effusion, not elsewhere classified: Secondary | ICD-10-CM | POA: Diagnosis not present

## 2018-05-24 DIAGNOSIS — C561 Malignant neoplasm of right ovary: Secondary | ICD-10-CM

## 2018-05-24 DIAGNOSIS — C569 Malignant neoplasm of unspecified ovary: Secondary | ICD-10-CM | POA: Diagnosis not present

## 2018-05-24 DIAGNOSIS — R180 Malignant ascites: Secondary | ICD-10-CM | POA: Insufficient documentation

## 2018-05-24 DIAGNOSIS — C562 Malignant neoplasm of left ovary: Secondary | ICD-10-CM

## 2018-05-24 MED ORDER — LORAZEPAM 0.5 MG PO TABS: 0.5000 mg | ORAL_TABLET | Freq: Three times a day (TID) | ORAL | 0 refills | Status: AC | PRN

## 2018-05-24 MED ORDER — LIDOCAINE HCL 1 % IJ SOLN
INTRAMUSCULAR | Status: AC
  Filled 2018-05-24: qty 10

## 2018-05-24 NOTE — Progress Notes (Signed)
[place] OFFICE PROGRESS NOTE   Diagnosis: Fallopian tube cancer  INTERVAL HISTORY:   [name] was seen yesterday with nausea/vomiting progressing over several days.  She received intravenous fluids.  She was prescribed Compazine.  The Compazine has helped partially.  She is having bowel movements.  She was unable to tolerate liquids yesterday. She underwent a paracentesis for 1.3 L of fluid today.  She feels better at present.  Objective:  Vital signs in last 24 hours:  Blood pressure 140/65, pulse 70, temperature 97.9 F (36.6 C), temperature source Oral, resp. rate 18, height _0  (1.549 m), weight 170 lb 1.6 oz (77.2 kg), SpO2 93 %.    HEENT: The mouth is dry, no thrush Resp: Distant breath sounds with rhonchi at the left posterior base, no respiratory distress Cardio: Regular rate and rhythm GI: Distended, less firm in the left abdomen compared to 05/23/2018 Vascular: Trace pitting edema at the left greater than right lower leg   Portacath/PICC-without erythema  Lab Results:  Lab Results  Component Value Date   WBC 6.8 05/20/2018   HGB 9.2 (L) 05/20/2018   HCT 31.5 (L) 05/20/2018   MCV 100.6 (H) 05/20/2018   PLT 399 05/20/2018   NEUTROABS 5.6 05/20/2018    CMP  Lab Results  Component Value Date   NA 138 05/20/2018   K 3.2 (L) 05/20/2018   CL 92 (L) 05/20/2018   CO2 36 (H) 05/20/2018   GLUCOSE 105 (H) 05/20/2018   BUN 19 05/20/2018   CREATININE 0.99 05/20/2018   CALCIUM 8.2 (L) 05/20/2018   PROT 6.6 05/15/2018   ALBUMIN 2.4 (L) 05/15/2018   AST 18 05/15/2018   ALT 7 05/15/2018   ALKPHOS 73 05/15/2018   BILITOT 0.3 05/15/2018   GFRNONAA 54 (L) 05/20/2018   GFRAA >60 05/20/2018    No results found for: CEA1  Lab Results  Component Value Date   INR 1.04 02/22/2018    Imaging:  Ct Abdomen Pelvis Wo Contrast  Result Date: 05/21/2018 CLINICAL DATA:  Acute onset of nausea, vomiting and generalized weakness. EXAM: CT  ABDOMEN AND PELVIS WITHOUT CONTRAST TECHNIQUE: Multidetector CT imaging of the abdomen and pelvis was performed following the standard protocol without IV contrast. COMPARISON:  CT of the abdomen and pelvis performed 05/05/2018 FINDINGS: Lower chest: A small left pleural effusion is noted, with associated atelectasis. Trace right-sided pleural fluid is seen. Scattered coronary artery calcifications are seen. Hepatobiliary: Mildly complex perihepatic ascites is again noted. The liver is grossly stable in appearance. The patient is status post cholecystectomy, with clips noted at the gallbladder fossa. Pancreas: The pancreas is within normal limits. Spleen: The spleen is unremarkable in appearance. Adrenals/Urinary Tract: The adrenal glands are unremarkable in appearance. The patient is status post left-sided nephrectomy. A tiny hyperdense cyst is noted at the lower pole of the right kidney. Minimal right-sided hydronephrosis may reflect the patient's baseline. No renal or ureteral stones are identified. Nonspecific perinephric stranding is noted about the right kidney. Stomach/Bowel: The stomach is unremarkable in appearance. The small bowel is within normal limits. The appendix is not visualized; there is no evidence for appendicitis. The colon is unremarkable in appearance. Vascular/Lymphatic: Diffuse calcification is seen along the abdominal aorta and its branches. The abdominal aorta is otherwise grossly unremarkable. The inferior vena cava is grossly unremarkable. No retroperitoneal lymphadenopathy is seen. No pelvic sidewall lymphadenopathy is identified. Reproductive: The bladder is mildly distended. Apparent bladder wall thickening may reflect relative decompression. The patient is  status post hysterectomy. No suspicious adnexal masses are seen. Other: Prominent somewhat loculated ascites is seen within the abdomen and pelvis, tracking about small bowel loops and along the lesser curvature of the stomach.  Musculoskeletal: No acute osseous abnormalities are identified. The visualized musculature is unremarkable in appearance. IMPRESSION: 1. Prominent somewhat loculated ascites within the abdomen and pelvis, tracking about small bowel loops and along the lesser curvature of the stomach. 2. Mildly complex perihepatic ascites again noted. 3. Small left pleural effusion, with associated atelectasis. Trace right-sided pleural fluid seen. 4. Scattered coronary artery calcifications. 5. Tiny hyperdense cyst at the lower pole of the right kidney. Minimal chronic right-sided hydronephrosis may reflect the patient's baseline. Aortic Atherosclerosis (ICD10-I70.0). Electronically Signed   By: Garald Balding M.D.   On: 05/21/2018 04:04   US Paracentesis  Result Date: 05/24/2018 INDICATION: Patient with history of fallopian tube carcinoma with recurrent malignant ascites. Request made for therapeutic paracentesis. EXAM: ULTRASOUND GUIDED THERAPEUTIC PARACENTESIS MEDICATIONS: None COMPLICATIONS: None immediate. PROCEDURE: Informed written consent was obtained from the patient after a discussion of the risks, benefits and alternatives to treatment. A timeout was performed prior to the initiation of the procedure. Initial ultrasound scanning demonstrates a small to moderate amount of ascites within the left lower abdominal quadrant. The left lower abdomen was prepped and draped in the usual sterile fashion. 1% lidocaine was used for local anesthesia. Following this, a 19 gauge, 10-cm, Yueh catheter was introduced. An ultrasound image was saved for documentation purposes. The paracentesis was performed. The catheter was removed and a dressing was applied. The patient tolerated the procedure well without immediate post procedural complication. FINDINGS: A total of approximately 1.3 liters of yellow fluid was removed. IMPRESSION: Successful ultrasound-guided therapeutic paracentesis yielding 1.3 liters of peritoneal fluid. Read by:  Rowe Robert, PA-C Electronically Signed   By: Jacqulynn Cadet M.D.   On: 05/24/2018 15:48   Dg Abd 2 Views  Result Date: 05/24/2018 CLINICAL DATA:  Upper abdominal pain, nausea and vomiting. Ovarian cancer. EXAM: ABDOMEN - 2 VIEW COMPARISON:  05/21/2018 FINDINGS: Pleural effusions present bilaterally, larger on the left with basilar atelectasis. No free air. Nonobstructive bowel gas pattern. Degenerative changes of the spine. Bones are osteopenic. IMPRESSION: Negative for obstruction or free air. Pleural effusions and basilar atelectasis, worse on the left Electronically Signed   By: Jerilynn Mages.  Shick M.D.   On: 05/24/2018 15:37    Medications: I have reviewed the patient's current medications.   Assessment/Plan: 1. 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 Significant improvement in malignant right pleural effusion.  Status post robotic-assisted laparoscopic hysterectomy with bilateral salpingoophorectomy, omentectomy, radical tumor debulking 12/29/2014. Per Dr. Serita Grit office note 01/14/2015 cytoreduction was optimal with residual  disease remaining only in the 1 mm implants on the small intestine. Pathology on the omentum showed high-grade serous carcinoma; papillary high-grade serous carcinoma arising from the right fallopian tube; high-grade serous carcinoma involving the right ovary; high-grade serous carcinoma involving  paratubal soft tissue of the left fallopian tube; high-grade serous carcinoma involving left ovary.  MSI-stable, mutation burden-4, BRAF rearrangement,ERBB4, KRAS G12D Cycle 1 adjuvant Taxol/carboplatin 01/20/2015 Cycle 2 adjuvant Taxol/carboplatin 02/10/2015 Cycle 3 adjuvant Taxol/carboplatin 03/10/2015 CA 125 on 1013 2016-42  CTs of the chest, abdomen, and pelvis 05/31/2015 with no evidence of progressive ovarian cancer  CTs of the chest, abdomen, and pelvis 08/07/2015 and 08/08/2015-no evidence of progressive ovarian cancer  Peritoneal studding noted at the time of the cholecystectomy procedure 08/09/2015 Elevated CA 125 10/07/2015  CT 10/07/2015 with stricturing at the sigmoid colon, constipation, and omental nodularity  Initiation of salvage weekly Taxol chemotherapy 10/13/2015  Taxol changed to every 2 weeks beginning 01/19/2016 due to painful neuropathy.  CT scans 07/19/2016 with no acute findings. No features in the abdomen or pelvis to suggest recurrent disease.Stable mild fullness right intrarenal collecting system.  Elevated CA 125 treatment resumed with Taxol/Avastin on a 2 week schedule 09/27/2016  CT 02/12/2017-no evidence of carcinomatosis, no evidence of progressive metastatic disease  Taxol/Avastin continued every 2 weeks  CT 09/10/2017-increase in trace ascites, no evidence of progressive carcinomatosis, inflammatory changes at the lung bases with a new 3 mm right lower lobe nodule  Taxol/Avastin continued every 2 weeks  Progressive rise in the CA 125  Cycle 1 carboplatin 3-week schedule4/17/2019  Cycle 2 carboplatin 11/14/2017  Progressive malignant left pleural  effusion June 2019  CT 01/07/2018-resolution of pulmonary nodule seen on prior examination. New small bilateral pleural effusions left greater than right with areas of atelectasis. Stable scattered mediastinal and hilar lymph nodes some partially calcified. Stable emphysematous changes and areas of pulmonary scarring. Small volume abdominal ascites but no omental or peritoneal surface disease. Small scattered mesenteric and retroperitoneal lymph nodes stable. Stable nodularity left adrenal gland.  Cycle 1 gemcitabine 01/09/2018 (day 1/day 8 schedule)  Cycle 2 gemcitabine 01/30/2018  Cycle 3 gemcitabine 02/21/2018  CT 03/03/2018-moderate free fluid in the abdomen and pelvis, thickened walls of small bowel loops in the right lower quadrant  Cycle 4 gemcitabine 03/13/2018  CT 03/03/2018-small bilateral pleural effusions, moderate ascites, small bowel wall thickening in the right lower quadrant  Paracentesis 03/20/2018-negative cytology  Cycle5day 1 gemcitabine 04/03/2018  Cycle 6 gemcitabine 04/24/2018  Peritoneal fluid cytology 04/24/2018-positive for metastatic adenocarcinoma  Clinical disease progression with a rising CA125  Cycle 1 Doxil 05/16/2018 2. COPD-followed by Dr. Lamonte Sakai. 3. Dyspnea secondary to COPD and the large right pleural effusion, status post therapeutic thoracentesis procedures 09/30/2014,10/09/2014, and 10/21/2014. Left thoracentesis 10/30/2014  Progressive left pleural effusion noted on chest x-ray 12/17/2017  Left thoracentesis 12/19/2017, cytology positive for metastatic adenocarcinoma 4. Left nephrectomy as a child 5. Delayed nausea following cycle 1 Taxol/carboplatin, Aloxi/Emend added with cycle 2 with improvement. 6. Right lower extremity edema, right calf pain 12/09/2014. Negative venous Doppler 12/09/2014. 7. Diffuse pruritus following cycle 1 adjuvant Taxol/carboplatin 01/20/2015, no rash, resolved with a steroid dose pack 8. Thrombocytopenia second to  chemotherapy, the carboplatin was dose reduced with cycle 2 adjuvant Taxol/carboplatin 02/10/2015 9. Chemotherapy-induced peripheral neuropathy-painful peripheral neuropathy involving the feet 01/19/2016.  10. Admission with acute cholecystitis 08/07/2015, status post a cholecystectomy 08/09/2015 11. Admission 10/08/2015 with abdominal pain/constipation, improved with bowel rest and laxatives 12. Mild right hydronephrosis noted on the CT 10/07/2015. Renal ultrasound 12/16/2015 with no hydronephrosis noted.No hydronephrosis on CT 07/19/2016. 13. Anemia secondary to chronic disease and chemotherapy, status post a red cell transfusion 12/05/2017 14. Admission 02/22/2018 with increased dyspnea 15. 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  She has completed 1 cycle of Doxil.  She has developed increased nausea over the past week.  This is likely secondary to progressive carcinomatosis.  A plain x-ray reveals no evidence of obstruction.  She is having bowel movements.  She will follow a liquid diet.  We will prescribe Ativan to use as needed for nausea.  She will continue Compazine and Zofran as needed.  She will contact us 05/28/2018 if the nausea has not improved.  She will be scheduled for IV fluids on 05/25/2018.  She will cancel this appointment if she feels better.  25 minutes were spent with the patient today.  The majority of the time was used for counseling and coordination of care.  Betsy Coder, MD  05/24/2018  4:13 PM

## 2018-05-24 NOTE — Procedures (Signed)
Ultrasound-guided  therapeutic paracentesis performed yielding 1.3 liters of yellow  fluid. No immediate complications.

## 2018-05-25 ENCOUNTER — Inpatient Hospital Stay: Payer: Medicare Other

## 2018-05-25 VITALS — BP 119/59 | HR 75 | Temp 98.0°F | Resp 18

## 2018-05-25 DIAGNOSIS — C561 Malignant neoplasm of right ovary: Secondary | ICD-10-CM

## 2018-05-25 DIAGNOSIS — C5700 Malignant neoplasm of unspecified fallopian tube: Secondary | ICD-10-CM

## 2018-05-25 DIAGNOSIS — Z95828 Presence of other vascular implants and grafts: Secondary | ICD-10-CM

## 2018-05-25 MED ORDER — SODIUM CHLORIDE 0.9 % IV SOLN
INTRAVENOUS | Status: DC
  Administered 2018-05-25: 09:00:00 via INTRAVENOUS
  Filled 2018-05-25 (×2): qty 250

## 2018-05-25 MED ORDER — HEPARIN SOD (PORK) LOCK FLUSH 100 UNIT/ML IV SOLN
500.0000 [IU] | Freq: Once | INTRAVENOUS | Status: AC
  Administered 2018-05-25: 500 [IU]
  Filled 2018-05-25: qty 5

## 2018-05-25 MED ORDER — SODIUM CHLORIDE 0.9% FLUSH
10.0000 mL | Freq: Once | INTRAVENOUS | Status: AC
  Administered 2018-05-25: 10 mL
  Filled 2018-05-25: qty 10

## 2018-05-25 NOTE — Patient Instructions (Signed)
Dehydration, Adult Dehydration is a condition in which there is not enough fluid or water in the body. This happens when you lose more fluids than you take in. Important organs, such as the kidneys, brain, and heart, cannot function without a proper amount of fluids. Any loss of fluids from the body can lead to dehydration. Dehydration can range from mild to severe. This condition should be treated right away to prevent it from becoming severe. What are the causes? This condition may be caused by:  Vomiting.  Diarrhea.  Excessive sweating, such as from heat exposure or exercise.  Not drinking enough fluid, especially: ? When ill. ? While doing activity that requires a lot of energy.  Excessive urination.  Fever.  Infection.  Certain medicines, such as medicines that cause the body to lose excess fluid (diuretics).  Inability to access safe drinking water.  Reduced physical ability to get adequate water and food.  What increases the risk? This condition is more likely to develop in people:  Who have a poorly controlled long-term (chronic) illness, such as diabetes, heart disease, or kidney disease.  Who are age 65 or older.  Who are disabled.  Who live in a place with high altitude.  Who play endurance sports.  What are the signs or symptoms? Symptoms of mild dehydration may include:  Thirst.  Dry lips.  Slightly dry mouth.  Dry, warm skin.  Dizziness. Symptoms of moderate dehydration may include:  Very dry mouth.  Muscle cramps.  Dark urine. Urine may be the color of tea.  Decreased urine production.  Decreased tear production.  Heartbeat that is irregular or faster than normal (palpitations).  Headache.  Light-headedness, especially when you stand up from a sitting position.  Fainting (syncope). Symptoms of severe dehydration may include:  Changes in skin, such as: ? Cold and clammy skin. ? Blotchy (mottled) or pale skin. ? Skin that does  not quickly return to normal after being lightly pinched and released (poor skin turgor).  Changes in body fluids, such as: ? Extreme thirst. ? No tear production. ? Inability to sweat when body temperature is high, such as in hot weather. ? Very little urine production.  Changes in vital signs, such as: ? Weak pulse. ? Pulse that is more than 100 beats a minute when sitting still. ? Rapid breathing. ? Low blood pressure.  Other changes, such as: ? Sunken eyes. ? Cold hands and feet. ? Confusion. ? Lack of energy (lethargy). ? Difficulty waking up from sleep. ? Short-term weight loss. ? Unconsciousness. How is this diagnosed? This condition is diagnosed based on your symptoms and a physical exam. Blood and urine tests may be done to help confirm the diagnosis. How is this treated? Treatment for this condition depends on the severity. Mild or moderate dehydration can often be treated at home. Treatment should be started right away. Do not wait until dehydration becomes severe. Severe dehydration is an emergency and it needs to be treated in a hospital. Treatment for mild dehydration may include:  Drinking more fluids.  Replacing salts and minerals in your blood (electrolytes) that you may have lost. Treatment for moderate dehydration may include:  Drinking an oral rehydration solution (ORS). This is a drink that helps you replace fluids and electrolytes (rehydrate). It can be found at pharmacies and retail stores. Treatment for severe dehydration may include:  Receiving fluids through an IV tube.  Receiving an electrolyte solution through a feeding tube that is passed through your nose   and into your stomach (nasogastric tube, or NG tube).  Correcting any abnormalities in electrolytes.  Treating the underlying cause of dehydration. Follow these instructions at home:  If directed by your health care provider, drink an ORS: ? Make an ORS by following instructions on the  package. ? Start by drinking small amounts, about  cup (120 mL) every 5-10 minutes. ? Slowly increase how much you drink until you have taken the amount recommended by your health care provider.  Drink enough clear fluid to keep your urine clear or pale yellow. If you were told to drink an ORS, finish the ORS first, then start slowly drinking other clear fluids. Drink fluids such as: ? Water. Do not drink only water. Doing that can lead to having too little salt (sodium) in the body (hyponatremia). ? Ice chips. ? Fruit juice that you have added water to (diluted fruit juice). ? Low-calorie sports drinks.  Avoid: ? Alcohol. ? Drinks that contain a lot of sugar. These include high-calorie sports drinks, fruit juice that is not diluted, and soda. ? Caffeine. ? Foods that are greasy or contain a lot of fat or sugar.  Take over-the-counter and prescription medicines only as told by your health care provider.  Do not take sodium tablets. This can lead to having too much sodium in the body (hypernatremia).  Eat foods that contain a healthy balance of electrolytes, such as bananas, oranges, potatoes, tomatoes, and spinach.  Keep all follow-up visits as told by your health care provider. This is important. Contact a health care provider if:  You have abdominal pain that: ? Gets worse. ? Stays in one area (localizes).  You have a rash.  You have a stiff neck.  You are more irritable than usual.  You are sleepier or more difficult to wake up than usual.  You feel weak or dizzy.  You feel very thirsty.  You have urinated only a small amount of very dark urine over 6-8 hours. Get help right away if:  You have symptoms of severe dehydration.  You cannot drink fluids without vomiting.  Your symptoms get worse with treatment.  You have a fever.  You have a severe headache.  You have vomiting or diarrhea that: ? Gets worse. ? Does not go away.  You have blood or green matter  (bile) in your vomit.  You have blood in your stool. This may cause stool to look black and tarry.  You have not urinated in 6-8 hours.  You faint.  Your heart rate while sitting still is over 100 beats a minute.  You have trouble breathing. This information is not intended to replace advice given to you by your health care provider. Make sure you discuss any questions you have with your health care provider. Document Released: 06/26/2005 Document Revised: 01/21/2016 Document Reviewed: 08/20/2015 Elsevier Interactive Patient Education  2018 [employer]  

## 2018-05-26 ENCOUNTER — Other Ambulatory Visit: Payer: Self-pay | Admitting: Emergency Medicine

## 2018-05-27 ENCOUNTER — Inpatient Hospital Stay (HOSPITAL_BASED_OUTPATIENT_CLINIC_OR_DEPARTMENT_OTHER): Payer: Medicare Other | Admitting: Oncology

## 2018-05-27 ENCOUNTER — Inpatient Hospital Stay (HOSPITAL_COMMUNITY): Payer: Medicare Other

## 2018-05-27 ENCOUNTER — Inpatient Hospital Stay (HOSPITAL_COMMUNITY)
Admission: AD | Admit: 2018-05-27 | Discharge: 2018-06-09 | DRG: 754 | Disposition: E | Payer: Medicare Other | Source: Ambulatory Visit | Attending: Family Medicine | Admitting: Family Medicine

## 2018-05-27 ENCOUNTER — Other Ambulatory Visit: Payer: Self-pay | Admitting: Internal Medicine

## 2018-05-27 ENCOUNTER — Telehealth: Payer: Self-pay | Admitting: *Deleted

## 2018-05-27 VITALS — BP 136/72 | HR 93 | Temp 97.5°F | Resp 18 | Ht 61.0 in | Wt 169.4 lb

## 2018-05-27 DIAGNOSIS — K449 Diaphragmatic hernia without obstruction or gangrene: Secondary | ICD-10-CM | POA: Diagnosis present

## 2018-05-27 DIAGNOSIS — J690 Pneumonitis due to inhalation of food and vomit: Secondary | ICD-10-CM | POA: Diagnosis present

## 2018-05-27 DIAGNOSIS — C786 Secondary malignant neoplasm of retroperitoneum and peritoneum: Secondary | ICD-10-CM

## 2018-05-27 DIAGNOSIS — I2510 Atherosclerotic heart disease of native coronary artery without angina pectoris: Secondary | ICD-10-CM | POA: Diagnosis present

## 2018-05-27 DIAGNOSIS — E785 Hyperlipidemia, unspecified: Secondary | ICD-10-CM | POA: Diagnosis present

## 2018-05-27 DIAGNOSIS — Z87891 Personal history of nicotine dependence: Secondary | ICD-10-CM

## 2018-05-27 DIAGNOSIS — D6481 Anemia due to antineoplastic chemotherapy: Secondary | ICD-10-CM | POA: Diagnosis present

## 2018-05-27 DIAGNOSIS — K208 Other esophagitis without bleeding: Secondary | ICD-10-CM

## 2018-05-27 DIAGNOSIS — C562 Malignant neoplasm of left ovary: Secondary | ICD-10-CM | POA: Diagnosis present

## 2018-05-27 DIAGNOSIS — Z9049 Acquired absence of other specified parts of digestive tract: Secondary | ICD-10-CM

## 2018-05-27 DIAGNOSIS — J9811 Atelectasis: Secondary | ICD-10-CM | POA: Diagnosis present

## 2018-05-27 DIAGNOSIS — C482 Malignant neoplasm of peritoneum, unspecified: Secondary | ICD-10-CM | POA: Diagnosis present

## 2018-05-27 DIAGNOSIS — Z90710 Acquired absence of both cervix and uterus: Secondary | ICD-10-CM

## 2018-05-27 DIAGNOSIS — K56609 Unspecified intestinal obstruction, unspecified as to partial versus complete obstruction: Secondary | ICD-10-CM | POA: Diagnosis not present

## 2018-05-27 DIAGNOSIS — Z79899 Other long term (current) drug therapy: Secondary | ICD-10-CM

## 2018-05-27 DIAGNOSIS — J910 Malignant pleural effusion: Secondary | ICD-10-CM | POA: Diagnosis present

## 2018-05-27 DIAGNOSIS — N182 Chronic kidney disease, stage 2 (mild): Secondary | ICD-10-CM | POA: Diagnosis present

## 2018-05-27 DIAGNOSIS — J9621 Acute and chronic respiratory failure with hypoxia: Secondary | ICD-10-CM | POA: Diagnosis present

## 2018-05-27 DIAGNOSIS — K219 Gastro-esophageal reflux disease without esophagitis: Secondary | ICD-10-CM | POA: Diagnosis present

## 2018-05-27 DIAGNOSIS — B0089 Other herpesviral infection: Secondary | ICD-10-CM | POA: Diagnosis not present

## 2018-05-27 DIAGNOSIS — Z833 Family history of diabetes mellitus: Secondary | ICD-10-CM

## 2018-05-27 DIAGNOSIS — Z8249 Family history of ischemic heart disease and other diseases of the circulatory system: Secondary | ICD-10-CM

## 2018-05-27 DIAGNOSIS — R112 Nausea with vomiting, unspecified: Secondary | ICD-10-CM | POA: Diagnosis present

## 2018-05-27 DIAGNOSIS — D638 Anemia in other chronic diseases classified elsewhere: Secondary | ICD-10-CM | POA: Diagnosis present

## 2018-05-27 DIAGNOSIS — E669 Obesity, unspecified: Secondary | ICD-10-CM | POA: Diagnosis present

## 2018-05-27 DIAGNOSIS — R1310 Dysphagia, unspecified: Secondary | ICD-10-CM

## 2018-05-27 DIAGNOSIS — J439 Emphysema, unspecified: Secondary | ICD-10-CM | POA: Diagnosis present

## 2018-05-27 DIAGNOSIS — T451X5A Adverse effect of antineoplastic and immunosuppressive drugs, initial encounter: Secondary | ICD-10-CM | POA: Diagnosis present

## 2018-05-27 DIAGNOSIS — J441 Chronic obstructive pulmonary disease with (acute) exacerbation: Secondary | ICD-10-CM | POA: Diagnosis not present

## 2018-05-27 DIAGNOSIS — Z91040 Latex allergy status: Secondary | ICD-10-CM

## 2018-05-27 DIAGNOSIS — Z823 Family history of stroke: Secondary | ICD-10-CM

## 2018-05-27 DIAGNOSIS — Z806 Family history of leukemia: Secondary | ICD-10-CM

## 2018-05-27 DIAGNOSIS — C5700 Malignant neoplasm of unspecified fallopian tube: Secondary | ICD-10-CM

## 2018-05-27 DIAGNOSIS — I129 Hypertensive chronic kidney disease with stage 1 through stage 4 chronic kidney disease, or unspecified chronic kidney disease: Secondary | ICD-10-CM | POA: Diagnosis present

## 2018-05-27 DIAGNOSIS — J449 Chronic obstructive pulmonary disease, unspecified: Secondary | ICD-10-CM | POA: Diagnosis not present

## 2018-05-27 DIAGNOSIS — R180 Malignant ascites: Secondary | ICD-10-CM | POA: Diagnosis present

## 2018-05-27 DIAGNOSIS — Z800 Family history of malignant neoplasm of digestive organs: Secondary | ICD-10-CM

## 2018-05-27 DIAGNOSIS — C561 Malignant neoplasm of right ovary: Principal | ICD-10-CM | POA: Diagnosis present

## 2018-05-27 DIAGNOSIS — Z880 Allergy status to penicillin: Secondary | ICD-10-CM

## 2018-05-27 DIAGNOSIS — Z9981 Dependence on supplemental oxygen: Secondary | ICD-10-CM

## 2018-05-27 DIAGNOSIS — C799 Secondary malignant neoplasm of unspecified site: Secondary | ICD-10-CM | POA: Diagnosis present

## 2018-05-27 DIAGNOSIS — I110 Hypertensive heart disease with heart failure: Secondary | ICD-10-CM | POA: Diagnosis not present

## 2018-05-27 DIAGNOSIS — R0602 Shortness of breath: Secondary | ICD-10-CM

## 2018-05-27 DIAGNOSIS — K2210 Ulcer of esophagus without bleeding: Secondary | ICD-10-CM | POA: Diagnosis not present

## 2018-05-27 DIAGNOSIS — Z6831 Body mass index (BMI) 31.0-31.9, adult: Secondary | ICD-10-CM | POA: Diagnosis not present

## 2018-05-27 DIAGNOSIS — J9611 Chronic respiratory failure with hypoxia: Secondary | ICD-10-CM | POA: Diagnosis not present

## 2018-05-27 DIAGNOSIS — K210 Gastro-esophageal reflux disease with esophagitis: Secondary | ICD-10-CM | POA: Diagnosis not present

## 2018-05-27 DIAGNOSIS — Z79890 Hormone replacement therapy (postmenopausal): Secondary | ICD-10-CM

## 2018-05-27 DIAGNOSIS — I5032 Chronic diastolic (congestive) heart failure: Secondary | ICD-10-CM | POA: Diagnosis not present

## 2018-05-27 DIAGNOSIS — B3781 Candidal esophagitis: Secondary | ICD-10-CM | POA: Diagnosis present

## 2018-05-27 DIAGNOSIS — Z801 Family history of malignant neoplasm of trachea, bronchus and lung: Secondary | ICD-10-CM

## 2018-05-27 DIAGNOSIS — Z905 Acquired absence of kidney: Secondary | ICD-10-CM

## 2018-05-27 DIAGNOSIS — K209 Esophagitis, unspecified: Secondary | ICD-10-CM | POA: Diagnosis not present

## 2018-05-27 DIAGNOSIS — Z803 Family history of malignant neoplasm of breast: Secondary | ICD-10-CM

## 2018-05-27 LAB — COMPREHENSIVE METABOLIC PANEL
ALBUMIN: 2.6 g/dL — AB (ref 3.5–5.0)
ALT: 9 U/L (ref 0–44)
ANION GAP: 7 (ref 5–15)
AST: 12 U/L — ABNORMAL LOW (ref 15–41)
Alkaline Phosphatase: 49 U/L (ref 38–126)
BUN: 20 mg/dL (ref 8–23)
CHLORIDE: 94 mmol/L — AB (ref 98–111)
CO2: 37 mmol/L — AB (ref 22–32)
Calcium: 8.7 mg/dL — ABNORMAL LOW (ref 8.9–10.3)
Creatinine, Ser: 0.89 mg/dL (ref 0.44–1.00)
GFR calc non Af Amer: >60 mL/min (ref >60)
Glucose, Bld: 93 mg/dL (ref 70–99)
POTASSIUM: 3.4 mmol/L — AB (ref 3.5–5.1)
SODIUM: 138 mmol/L (ref 135–145)
Total Bilirubin: 0.9 mg/dL (ref 0.3–1.2)
Total Protein: 6.3 g/dL — ABNORMAL LOW (ref 6.5–8.1)

## 2018-05-27 LAB — CBC WITH DIFFERENTIAL/PLATELET
Abs Immature Granulocytes: 0.03 10*3/uL (ref 0.00–0.07)
BASOS ABS: 0 10*3/uL (ref 0.0–0.1)
BASOS PCT: 1 %
EOS ABS: 0.1 10*3/uL (ref 0.0–0.5)
Eosinophils Relative: 1 %
HCT: 30.7 % — ABNORMAL LOW (ref 36.0–46.0)
Hemoglobin: 8.7 g/dL — ABNORMAL LOW (ref 12.0–15.0)
Immature Granulocytes: 1 %
Lymphocytes Relative: 14 %
Lymphs Abs: 0.6 10*3/uL — ABNORMAL LOW (ref 0.7–4.0)
MCH: 29.4 pg (ref 26.0–34.0)
MCHC: 28.3 g/dL — AB (ref 30.0–36.0)
MCV: 103.7 fL — ABNORMAL HIGH (ref 80.0–100.0)
MONO ABS: 0.1 10*3/uL (ref 0.1–1.0)
Monocytes Relative: 3 %
NRBC: 0 % (ref 0.0–0.2)
Neutro Abs: 3.4 10*3/uL (ref 1.7–7.7)
Neutrophils Relative %: 80 %
PLATELETS: 209 10*3/uL (ref 150–400)
RBC: 2.96 MIL/uL — AB (ref 3.87–5.11)
RDW: 18 % — AB (ref 11.5–15.5)
WBC: 4.2 10*3/uL (ref 4.0–10.5)

## 2018-05-27 MED ORDER — ALBUTEROL SULFATE (2.5 MG/3ML) 0.083% IN NEBU
2.5000 mg | INHALATION_SOLUTION | Freq: Four times a day (QID) | RESPIRATORY_TRACT | Status: DC | PRN
  Administered 2018-05-30 – 2018-06-01 (×4): 2.5 mg via RESPIRATORY_TRACT
  Filled 2018-05-27 (×4): qty 3

## 2018-05-27 MED ORDER — SODIUM CHLORIDE 0.9 % IV SOLN
INTRAVENOUS | Status: DC
  Administered 2018-05-27 – 2018-05-31 (×7): via INTRAVENOUS

## 2018-05-27 MED ORDER — ACETAMINOPHEN 650 MG RE SUPP: 650.0000 mg | Freq: Four times a day (QID) | RECTAL | Status: DC | PRN

## 2018-05-27 MED ORDER — GABAPENTIN 300 MG PO CAPS
300.0000 mg | ORAL_CAPSULE | Freq: Every day | ORAL | Status: DC
  Administered 2018-05-27 – 2018-06-01 (×6): 300 mg via ORAL
  Filled 2018-05-27 (×6): qty 1

## 2018-05-27 MED ORDER — ONDANSETRON HCL 4 MG PO TABS: 4.0000 mg | ORAL_TABLET | Freq: Four times a day (QID) | ORAL | Status: DC | PRN

## 2018-05-27 MED ORDER — ACETAMINOPHEN 325 MG PO TABS
650.0000 mg | ORAL_TABLET | Freq: Four times a day (QID) | ORAL | Status: DC | PRN
  Administered 2018-05-31 – 2018-06-01 (×2): 650 mg via ORAL
  Filled 2018-05-27 (×2): qty 2

## 2018-05-27 MED ORDER — LORAZEPAM 0.5 MG PO TABS
0.5000 mg | ORAL_TABLET | Freq: Three times a day (TID) | ORAL | Status: DC | PRN
  Administered 2018-05-27 – 2018-05-30 (×5): 0.5 mg via SUBLINGUAL
  Filled 2018-05-27 (×6): qty 1

## 2018-05-27 MED ORDER — VITAMIN B-6 100 MG PO TABS
100.0000 mg | ORAL_TABLET | Freq: Every day | ORAL | Status: DC
  Administered 2018-05-30: 100 mg via ORAL
  Filled 2018-05-27 (×5): qty 1

## 2018-05-27 MED ORDER — VITAMIN B-12 100 MCG PO TABS
100.0000 ug | ORAL_TABLET | Freq: Every day | ORAL | Status: DC
  Administered 2018-05-30: 100 ug via ORAL
  Filled 2018-05-27 (×5): qty 1

## 2018-05-27 MED ORDER — ONDANSETRON HCL 4 MG/2ML IJ SOLN
4.0000 mg | Freq: Four times a day (QID) | INTRAMUSCULAR | Status: DC | PRN
  Administered 2018-05-28 – 2018-06-01 (×7): 4 mg via INTRAVENOUS
  Filled 2018-05-27 (×8): qty 2

## 2018-05-27 MED ORDER — ZOLPIDEM TARTRATE 5 MG PO TABS: 5.0000 mg | ORAL_TABLET | Freq: Every evening | ORAL | Status: DC | PRN

## 2018-05-27 MED ORDER — METOCLOPRAMIDE HCL 5 MG/ML IJ SOLN
5.0000 mg | Freq: Four times a day (QID) | INTRAMUSCULAR | Status: DC
  Administered 2018-05-29 – 2018-06-01 (×5): 5 mg via INTRAVENOUS
  Filled 2018-05-27 (×9): qty 2

## 2018-05-27 MED ORDER — POLYETHYLENE GLYCOL 3350 17 G PO PACK: 17.0000 g | PACK | Freq: Every day | ORAL | Status: DC | PRN

## 2018-05-27 MED ORDER — LEVOTHYROXINE SODIUM 50 MCG PO TABS
50.0000 ug | ORAL_TABLET | Freq: Every day | ORAL | Status: DC
  Administered 2018-05-28 – 2018-06-01 (×5): 50 ug via ORAL
  Filled 2018-05-27 (×5): qty 1

## 2018-05-27 MED ORDER — PANTOPRAZOLE SODIUM 40 MG PO TBEC
40.0000 mg | DELAYED_RELEASE_TABLET | Freq: Every day | ORAL | Status: DC
  Administered 2018-05-27 – 2018-05-28 (×2): 40 mg via ORAL
  Filled 2018-05-27 (×2): qty 1

## 2018-05-27 NOTE — Telephone Encounter (Signed)
Notified patient that Dr. Benay Spice will see her today at 2 pm.

## 2018-05-27 NOTE — Telephone Encounter (Signed)
Called to report she is still having N/V daily. Minimal po intake (liquids only) and still vomits despite lorazepam SL. Bowels are moving normally.

## 2018-05-27 NOTE — Progress Notes (Signed)
Lynn Ramirez OFFICE PROGRESS NOTE   Diagnosis: Fallopian tube carcinoma  INTERVAL HISTORY:   Lynn Ramirez returns prior to his scheduled visit.  She has persistent nausea and vomiting.  She is able to drink liquids, but vomits anything she eats or drinks approximately 30 minutes later.  The abdomen is more distended over the past few days.  She is having bowel movements.  Objective:  Vital signs in last 24 hours:  Blood pressure 136/72, pulse 93, temperature (!) 97.5 F (36.4 C), temperature source Oral, resp. rate 18, height '5\' 1"'  (1.549 m), weight 169 lb 6.4 oz (76.8 kg), SpO2 97 %, peak flow (!) 2 L/min.    HEENT: The mouth is dry, no thrush Resp: Distant breath sounds, no respiratory distress Cardio: Regular rate and rhythm GI: Distended, firm in the left abdomen Vascular: Pitting edema at the left greater than right lower leg    Portacath/PICC-without erythema  Lab Results:  Lab Results  Component Value Date   WBC 6.8 05/20/2018   HGB 9.2 (L) 05/20/2018   HCT 31.5 (L) 05/20/2018   MCV 100.6 (H) 05/20/2018   PLT 399 05/20/2018   NEUTROABS 5.6 05/20/2018    CMP  Lab Results  Component Value Date   NA 138 05/20/2018   K 3.2 (L) 05/20/2018   CL 92 (L) 05/20/2018   CO2 36 (H) 05/20/2018   GLUCOSE 105 (H) 05/20/2018   BUN 19 05/20/2018   CREATININE 0.99 05/20/2018   CALCIUM 8.2 (L) 05/20/2018   PROT 6.6 05/15/2018   ALBUMIN 2.4 (L) 05/15/2018   AST 18 05/15/2018   ALT 7 05/15/2018   ALKPHOS 73 05/15/2018   BILITOT 0.3 05/15/2018   GFRNONAA 54 (L) 05/20/2018   GFRAA >60 05/20/2018   Imaging:  US Paracentesis  Result Date: 05/24/2018 INDICATION: Patient with history of fallopian tube carcinoma with recurrent malignant ascites. Request made for therapeutic paracentesis. EXAM: ULTRASOUND GUIDED THERAPEUTIC PARACENTESIS MEDICATIONS: None COMPLICATIONS: None immediate. PROCEDURE: Informed written consent was obtained from the patient after a  discussion of the risks, benefits and alternatives to treatment. A timeout was performed prior to the initiation of the procedure. Initial ultrasound scanning demonstrates a small to moderate amount of ascites within the left lower abdominal quadrant. The left lower abdomen was prepped and draped in the usual sterile fashion. 1% lidocaine was used for local anesthesia. Following this, a 19 gauge, 10-cm, Yueh catheter was introduced. An ultrasound image was saved for documentation purposes. The paracentesis was performed. The catheter was removed and a dressing was applied. The patient tolerated the procedure well without immediate post procedural complication. FINDINGS: A total of approximately 1.3 liters of yellow fluid was removed. IMPRESSION: Successful ultrasound-guided therapeutic paracentesis yielding 1.3 liters of peritoneal fluid. Read by: Rowe Robert, PA-C Electronically Signed   By: Jacqulynn Cadet M.D.   On: 05/24/2018 15:48   Dg Abd 2 Views  Result Date: 05/24/2018 CLINICAL DATA:  Upper abdominal pain, nausea and vomiting. Ovarian cancer. EXAM: ABDOMEN - 2 VIEW COMPARISON:  05/21/2018 FINDINGS: Pleural effusions present bilaterally, larger on the left with basilar atelectasis. No free air. Nonobstructive bowel gas pattern. Degenerative changes of the spine. Bones are osteopenic. IMPRESSION: Negative for obstruction or free air. Pleural effusions and basilar atelectasis, worse on the left Electronically Signed   By: Jerilynn Mages.  Shick M.D.   On: 05/24/2018 15:37    Medications: I have reviewed the patient's current medications.   Assessment/Plan: 1. Malignant right pleural effusion-cytology revealed metastatic adenocarcinoma with  papillary features, immunohistochemical profile consistent with a GYN primary, elevated CA 125   Staging CTs of the chest, abdomen, and pelvis on  10/06/2014 revealed a loculated right pleural effusion, ascites, and omental/mesenteric thickening   Cytology from peritoneal fluid 10/13/2014 revealed malignant cells consistent with metastatic adenocarcinoma Biopsy of an omental mass on 11/02/2014 revealed invasive serous carcinoma with psammoma bodies Cycle 1 Taxol/carboplatin 10/28/2014 Cycle 2 Taxol/carboplatin 11/18/2014 Cycle 3 Taxol/carboplatin 12/09/2014  CT scan 12/23/2014 with interval improvement in peritoneal carcinomatosis with near-complete resolution of ascites and decreased omental nodularity. Significant improvement in malignant right pleural effusion.  Status post robotic-assisted laparoscopic hysterectomy with bilateral salpingoophorectomy, omentectomy, radical tumor debulking 12/29/2014. Per Dr. Serita Grit office note 01/14/2015 cytoreduction was optimal with residual disease remaining only in the 1 mm implants on the small intestine. 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  MSI-stable, mutation burden-4, BRAF rearrangement,ERBB4, KRAS G12D Cycle 1 adjuvant Taxol/carboplatin 01/20/2015 Cycle 2 adjuvant Taxol/carboplatin 02/10/2015 Cycle 3 adjuvant Taxol/carboplatin 03/10/2015 CA 125 on 1013 2016-42  CTs of the chest, abdomen, and pelvis 05/31/2015 with no evidence of progressive ovarian cancer  CTs of the chest, abdomen, and pelvis 08/07/2015 and 08/08/2015-no evidence of progressive ovarian cancer  Peritoneal studding noted at the time of the cholecystectomy procedure 08/09/2015 Elevated CA 125 10/07/2015  CT 10/07/2015 with stricturing at the sigmoid colon, constipation, and omental nodularity  Initiation of salvage weekly Taxol  chemotherapy 10/13/2015  Taxol changed to every 2 weeks beginning 01/19/2016 due to painful neuropathy.  CT scans 07/19/2016 with no acute findings. No features in the abdomen or pelvis to suggest recurrent disease.Stable mild fullness right intrarenal collecting system.  Elevated CA 125 treatment resumed with Taxol/Avastin on a 2 week schedule 09/27/2016  CT 02/12/2017-no evidence of carcinomatosis, no evidence of progressive metastatic disease  Taxol/Avastin continued every 2 weeks  CT 09/10/2017-increase in trace ascites, no evidence of progressive carcinomatosis, inflammatory changes at the lung bases with a new 3 mm right lower lobe nodule  Taxol/Avastin continued every 2 weeks  Progressive rise in the CA 125  Cycle 1 carboplatin 3-week schedule4/17/2019  Cycle 2 carboplatin 11/14/2017  Progressive malignant left pleural effusion June 2019  CT 01/07/2018-resolution of pulmonary nodule seen on prior examination. New small bilateral pleural effusions left greater than right with areas of atelectasis. Stable scattered mediastinal and hilar lymph nodes some partially calcified. Stable emphysematous changes and areas of pulmonary scarring. Small volume abdominal ascites but no omental or peritoneal surface disease. Small scattered mesenteric and retroperitoneal lymph nodes stable. Stable nodularity left adrenal gland.  Cycle 1 gemcitabine 01/09/2018 (day 1/day 8 schedule)  Cycle 2 gemcitabine 01/30/2018  Cycle 3 gemcitabine 02/21/2018  CT 03/03/2018-moderate free fluid in the abdomen and pelvis, thickened walls of small bowel loops in the right lower quadrant  Cycle 4 gemcitabine 03/13/2018  CT 03/03/2018-small bilateral pleural effusions, moderate ascites, small bowel wall thickening in the right lower quadrant  Paracentesis 03/20/2018-negative cytology  Cycle5day 1 gemcitabine 04/03/2018  Cycle 6 gemcitabine 04/24/2018  Peritoneal fluid cytology 04/24/2018-positive for  metastatic adenocarcinoma  Clinical disease progression with a rising CA125  Cycle 1 Doxil 05/16/2018 2. COPD-followed by Dr. Lamonte Sakai. 3. Dyspnea secondary to COPD and the large right pleural effusion, status post therapeutic thoracentesis procedures 09/30/2014,10/09/2014, and 10/21/2014. Left thoracentesis 10/30/2014  Progressive left  pleural effusion noted on chest x-ray 12/17/2017  Left thoracentesis 12/19/2017, cytology positive for metastatic adenocarcinoma 4. Left nephrectomy as a child 5. Delayed nausea following cycle 1 Taxol/carboplatin, Aloxi/Emend added with cycle 2 with improvement. 6. Right lower extremity edema, right calf pain 12/09/2014. Negative venous Doppler 12/09/2014. 7. Diffuse pruritus following cycle 1 adjuvant Taxol/carboplatin 01/20/2015, no rash, resolved with a steroid dose pack 8. Thrombocytopenia second to chemotherapy, the carboplatin was dose reduced with cycle 2 adjuvant Taxol/carboplatin 02/10/2015 9. Chemotherapy-induced peripheral neuropathy-painful peripheral neuropathy involving the feet 01/19/2016.  10. Admission with acute cholecystitis 08/07/2015, status post a cholecystectomy 08/09/2015 11. Admission 10/08/2015 with abdominal pain/constipation, improved with bowel rest and laxatives 12. Mild right hydronephrosis noted on the CT 10/07/2015. Renal ultrasound 12/16/2015 with no hydronephrosis noted.No hydronephrosis on CT 07/19/2016. 13. Anemia secondary to chronic disease and chemotherapy, status post a red cell transfusion 12/05/2017 14. Admission 02/22/2018 with increased dyspnea 15. Admission 03/23/2018 with a COPD flare 16. Intractable nausea/vomiting November 2019    Disposition: Lynn Ramirez has metastatic fallopian tube cancer.  She has developed intractable nausea and vomiting over the past week.  I suspect her symptoms are related to bowel obstruction.  She may have gastric outlet obstruction given the vomiting of liquid and food 30 minutes to 1  hour after eating.  I discussed the probable etiology of the nausea/vomiting with Lynn Ramirez.  We discussed comfort care versus additional diagnostic evaluation.  She may be a candidate for a palliative stent or gastrostomy tube if she is found to have gastric outlet obstruction.  She would like to proceed with further evaluation and treatment aimed at relief of the vomiting if treatment is available.  We discussed CPR and ACLS issues.  She is undecided regarding CODE STATUS.  We will continue this discussion in the hospital.  I will arrange for hospital admission.  We will consult gastroenterology to consider an upper endoscopy versus upper GI series in an attempt to define the etiology of her symptoms.  25 minutes were spent with the patient today.  The majority of the time was used for counseling and coordination of care.  Betsy Coder, MD  05/15/2018  3:43 PM

## 2018-05-27 NOTE — H&P (Signed)
History and Physical  Lynn Ramirez WUJ:[id number] DOB: 13-Jan-1941 DOA: 05/18/2018  Referring physician: Julieanne Manson, oncology PCP: Midge Minium, MD  Outpatient Specialists: Julieanne Manson, oncology Patient coming from: Home & is able to ambulate walker  Chief Complaint: Nausea and vomiting  HPI: Lynn Ramirez is a 77 y.o. 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  She is able to tolerate a little bit of liquids.  No real association with abdominal pain.  Patient had some issues with abdominal pain earlier, but this resolved after she had ascites removed by paracentesis on 11/15.  Her nausea and vomiting symptoms have persisted.  She was seen in the oncology office today and after discussion, oncology felt like this may be a gastric outlet obstruction.  Patient was able to be convinced to come in as a direct admission and undergo further evaluation.   Review of Systems: Patient seen after arrival to floor. Pt complains of feeling a little bit weak and nauseated  Pt denies any headache, vision changes, dysphagia, chest pain, palpitations, shortness of breath, wheeze, cough, abdominal pain currently, hematuria, dysuria, diarrhea, focal extremity numbness weakness or pain..  Review of systems are otherwise negative   Past Medical History:  Diagnosis Date  . CKD (chronic kidney disease), stage II   . COPD (chronic obstructive pulmonary disease) ([place])   . Coronary artery calcification seen on CT scan   . Difficulty sleeping   . Eczema    hands  . Emphysema   . GERD (gastroesophageal reflux disease)   . H/O hydronephrosis   . History of transfusion    age 50  .  Hyperlipidemia   . Hypertension   . Ovarian cancer ([place]) dx'd 09/2014   metastatic - prior malignant R pleural effusion and peritoneal carcinomatosis  . S/p nephrectomy    Past Surgical History:  Procedure Laterality Date  . ABDOMINAL HYSTERECTOMY    . CHOLECYSTECTOMY N/A 08/09/2015   Procedure: Attempted LAPAROSCOPIC coverted  open CHOLECYSTECTOMY WITH INTRAOPERATIVE CHOLANGIOGRAM;  Surgeon: Autumn Messing III, MD;  Location: [facility];  Service: General;  Laterality: N/A;  . HAND SURGERY Right 1980's  . LAPAROTOMY N/A 12/29/2014   Procedure:  LAPAROTOMY;  Surgeon: Everitt Amber, MD;  Location: [facility];  Service: Gynecology;  Laterality: N/A;  . NEPHRECTOMY    . ROBOTIC ASSISTED TOTAL HYSTERECTOMY WITH BILATERAL SALPINGO OOPHERECTOMY Bilateral 12/29/2014   Procedure: ROBOTIC ASSISTED TOTAL LAPAROSCOPIC HYSTERECTOMY WITH BILATERAL SALPINGO OOPHORECTOMY AND OOMENTECTOMY WITH RADICAL TUMOR [place] ;  Surgeon: Everitt Amber, MD;  Location: [facility];  Service: Gynecology;  Laterality: Bilateral;  . THORACENTESIS     several  . TONSILLECTOMY    . TUBAL LIGATION      Social History:  reports that she quit smoking about 4 years ago. Her smoking use included cigarettes. She has a 40.00 pack-year smoking history. She has never used smokeless tobacco. She reports that she drinks alcohol. She reports that she does not use drugs.  She was at home with her husband.  Ambulates using a walker   Allergies  Allergen Reactions  . Latex Rash    Latex gloves ONLY (pt cannot wear them- no reaction if someone else touches her with  latex gloves on)  . Penicillins Other (See Comments)    welts Has patient had a PCN reaction causing immediate rash, facial/tongue/throat swelling, SOB or lightheadedness with hypotension: Yes  Has patient had a PCN reaction causing severe rash involving mucus membranes or skin necrosis:No Has patient had a PCN reaction that required hospitalization:No Has patient had a PCN reaction occurring  within the last 10 years:No If all of the above answers are "NO", then may proceed with Cephalosporin use.     Family History  Problem Relation Age of Onset  . Diabetes Father   . Lung cancer Father 22       metastasis to liver  . Cancer Father        liver  . Pancreatic cancer Mother 32  . Stroke Paternal Grandfather   . Heart disease Maternal Aunt   . Diabetes Paternal Uncle   . Heart Problems Maternal Grandmother   . Heart Problems Maternal Grandfather   . Diabetes Paternal Grandmother   . Stroke Paternal Grandmother   . Heart Problems Paternal Uncle   . Cancer Cousin        unknown type  . Breast cancer Cousin        dx. 49s  . Leukemia Cousin        dx. 16-17  . Colon cancer Neg Hx   . Stomach cancer Neg Hx       Prior to Admission medications   Medication Sig Start Date End Date Taking? Authorizing Provider  acetaminophen (TYLENOL) 650 MG CR tablet Take 650 mg by mouth every 8 (eight) hours as needed for pain.    [provider]  albuterol (PROAIR HFA) 108 (90 Base) MCG/ACT inhaler Inhale 2 puffs into the lungs 4 (four) times daily as needed for wheezing. 05/29/2018   Collene Gobble, MD  albuterol (PROVENTIL) (2.5 MG/3ML) 0.083% nebulizer solution INHALE THE CONTENTS OF 1 VIAL VIA NEBULIZER EVERY 6 HOURS AS NEEDED FOR WHEEZING OR SHORTNESS OF BREATH Patient taking differently: Take 2.5 mg by nebulization every 6 (six) hours as needed for wheezing or shortness of breath.  02/25/18   Collene Gobble, MD  antiseptic oral rinse (BIOTENE) LIQD 15 mLs by Mouth Rinse route 3 (three) times daily.    [provider]  Biotin 2500 MCG CAPS Take 1 tablet by mouth daily.    [provider]  carvedilol (COREG) 3.125 MG tablet TAKE 1 TABLET(3.125 MG) BY MOUTH TWICE DAILY WITH A MEAL Patient taking differently: Take 3.125 mg by mouth 2 (two) times daily with a meal.  11/27/17   Dorothy Spark, MD  cholecalciferol (VITAMIN D) 1000 units tablet Take 1,000  Units by mouth daily.    [provider]  esomeprazole (NEXIUM) 40 MG capsule Take 40 mg by mouth daily at 12 noon.    [provider]  Fluticasone Furoate (ARNUITY ELLIPTA) 200 MCG/ACT AEPB Inhale 1 puff into the lungs daily. 04/01/18   Collene Gobble, MD  furosemide (LASIX) 40 MG tablet Take 1 tablet (40 mg total) by mouth daily. 12/10/17   Dorothy Spark, MD  gabapentin (NEURONTIN) 300 MG capsule TAKE 1 CAPSULE BY MOUTH EVERY NIGHT AT BEDTIME 03/07/18   Ladell Pier, MD  levothyroxine (SYNTHROID, LEVOTHROID) 50 MCG tablet Take 1 tablet (50 mcg total) by mouth daily. 04/03/18   Midge Minium, MD  lidocaine-prilocaine (EMLA) cream Apply to port site one hour prior to use. Do not rub in. Cover with plastic. 10/12/16  Ladell Pier, MD  loratadine (CLARITIN) 10 MG tablet Take 10 mg by mouth daily.    [provider]  LORazepam (ATIVAN) 0.5 MG tablet Place 1 tablet (0.5 mg total) under the tongue every 8 (eight) hours as needed (nausea). 05/24/18   Ladell Pier, MD  ondansetron (ZOFRAN-ODT) 4 MG disintegrating tablet Take 1 tablet (4 mg total) by mouth every 8 (eight) hours as needed for nausea or vomiting. 12/05/17   Ladell Pier, MD  OXYGEN Inhale 2 L into the lungs.    [provider]  Potassium Chloride ER 20 MEQ TBCR TAKE 1/2 TABLET BY MOUTH DAILY Patient taking differently: Take 10 mg by mouth daily.  03/19/18   Ladell Pier, MD  prochlorperazine (COMPAZINE) 10 MG tablet Take 1 tablet (10 mg total) by mouth every 6 (six) hours as needed for nausea or vomiting. 05/23/18   Tanner, Lyndon Code., PA-C  pyridoxine (B-6) 100 MG tablet Take 100 mg by mouth daily.    [provider]  STIOLTO RESPIMAT 2.5-2.5 MCG/ACT AERS INHALE 2 PUFFS INTO THE LUNGS DAILY Patient taking differently: Take 2 puffs by mouth daily.  09/11/17   Collene Gobble, MD  vitamin B-12 (CYANOCOBALAMIN) 100 MCG tablet Take 100 mcg by mouth daily. Reported on 01/19/2016     [provider]  zolpidem (AMBIEN) 5 MG tablet Take 1 tablet (5 mg total) by mouth at bedtime as needed for sleep. 08/01/17   [name], NP    Physical Exam: BP 124/67 (BP Location: Left Arm)   Pulse 95   Temp 98 F (36.7 C) (Oral)   Resp 18   SpO2 100%   General: Alert and oriented x3, no acute distress Eyes: Sclera nonicteric, extra ocular movements are intact ENT: Normocephalic and atraumatic, mucous memories are dry Neck: Supple, no JVD Cardiovascular: Regular rate and rhythm, S1-S2 Respiratory: Clear to auscultation bilaterally Abdomen: Somewhat soft, distended, nontender, scant bowel sounds Skin: No skin breaks, tears or lesions Musculoskeletal: No clubbing or cyanosis, trace pitting edema Psychiatric: Appropriate, no evidence of psychoses Neurologic: No focal deficits          Labs on Admission:  Basic Metabolic Panel: Recent Labs  Lab 05/20/18 2245  NA 138  K 3.2*  CL 92*  CO2 36*  GLUCOSE 105*  BUN 19  CREATININE 0.99  CALCIUM 8.2*   Liver Function Tests: No results for input(s): AST, ALT, ALKPHOS, BILITOT, PROT, ALBUMIN in the last 168 hours. No results for input(s): LIPASE, AMYLASE in the last 168 hours. No results for input(s): AMMONIA in the last 168 hours. CBC: Recent Labs  Lab 05/20/18 2245 05/15/2018 1821  WBC 6.8 4.2  NEUTROABS 5.6 3.4  HGB 9.2* 8.7*  HCT 31.5* 30.7*  MCV 100.6* 103.7*  PLT 399 209   Cardiac Enzymes: No results for input(s): CKTOTAL, CKMB, CKMBINDEX, TROPONINI in the last 168 hours.  BNP (last 3 results) Recent Labs    03/23/18 1435  BNP 65.5    ProBNP (last 3 results) Recent Labs    12/17/17 1643  PROBNP 24.0    CBG: No results for input(s): GLUCAP in the last 168 hours.  Radiological Exams on Admission: No results found.  EKG: Not done  Assessment/Plan Present on Admission: . Metastatic adenocarcinoma (HCC)/primary peritoneal carcinomatosis/malignant neoplasm of fallopian tube with  malignant ascites: Oncology following.  Status post paracentesis done 11/15.  Marland Kitchen CAD (coronary artery disease): Stable.  . Chronic respiratory failure with hypoxia (HCC) secondary to  COPD: Stable at this time.  Continue home 2 L.  . Obesity (BMI 30-39.9): Patient meets criteria with BMI greater than 30.  . Nausea & vomiting: Principal problem.  It is been getting much worse over the past week and anything that the patient eats she immediately throws up or soon after along with almost all liquids.  Suspicious for tumor causing gastric outlet obstruction.  Patient had an abdominal CT done little over a week ago which noted no signs of physical blockage in the lower part of her bowel.  Abdominal x-ray done after paracentesis on Friday noted no signs of obstruction then.  We will still repeat x-ray now and will ask gastroenterology to see and evaluate.  Patient may benefit from a stent placement or other some other form of palliative measure if this is indeed gastric outlet obstruction.  In the meantime, clear liquids and medication for nausea.  Anemia: Likely of chronic disease.  Will follow hemoglobin levels.  Principal Problem:   Nausea & vomiting Active Problems:   Metastatic adenocarcinoma (HCC)   Primary peritoneal carcinomatosis (HCC)   Malignant ascites   Malignant neoplasm of fallopian tube (HCC)   CAD (coronary artery disease)   Chronic respiratory failure with hypoxia (HCC)   COPD GOLD III D   Obesity (BMI 30-39.9)   DVT prophylaxis: SCDs  Code Status: Limited code as discussed with patient.  No intubation no compressions  Family Communication: Husband at the bedside  Disposition Plan: Home once diagnosis better made, plans in place  Consults called: [place] gastroenterology  Admission status: Given need for acute hospital services and will be here past 2 midnights, have admitted as inpatient    Annita Brod MD [facility] Pager [phone]  If 7PM-7AM,  please contact night-coverage [email or web address] Password [facility]  05/12/2018, 6:42 PM

## 2018-05-28 ENCOUNTER — Encounter (HOSPITAL_COMMUNITY): Payer: Self-pay

## 2018-05-28 ENCOUNTER — Other Ambulatory Visit: Payer: Self-pay

## 2018-05-28 DIAGNOSIS — R112 Nausea with vomiting, unspecified: Secondary | ICD-10-CM

## 2018-05-28 DIAGNOSIS — R180 Malignant ascites: Secondary | ICD-10-CM

## 2018-05-28 DIAGNOSIS — J910 Malignant pleural effusion: Secondary | ICD-10-CM

## 2018-05-28 DIAGNOSIS — C799 Secondary malignant neoplasm of unspecified site: Secondary | ICD-10-CM

## 2018-05-28 DIAGNOSIS — J449 Chronic obstructive pulmonary disease, unspecified: Secondary | ICD-10-CM

## 2018-05-28 DIAGNOSIS — C5700 Malignant neoplasm of unspecified fallopian tube: Secondary | ICD-10-CM

## 2018-05-28 LAB — BASIC METABOLIC PANEL
ANION GAP: 6 (ref 5–15)
BUN: 16 mg/dL (ref 8–23)
CO2: 37 mmol/L — ABNORMAL HIGH (ref 22–32)
Calcium: 8.4 mg/dL — ABNORMAL LOW (ref 8.9–10.3)
Chloride: 98 mmol/L (ref 98–111)
Creatinine, Ser: 0.75 mg/dL (ref 0.44–1.00)
GFR calc Af Amer: >60 mL/min (ref >60)
GLUCOSE: 89 mg/dL (ref 70–99)
POTASSIUM: 3.6 mmol/L (ref 3.5–5.1)
SODIUM: 141 mmol/L (ref 135–145)

## 2018-05-28 LAB — CBC
HCT: 27.7 % — ABNORMAL LOW (ref 36.0–46.0)
Hemoglobin: 8 g/dL — ABNORMAL LOW (ref 12.0–15.0)
MCH: 29.7 pg (ref 26.0–34.0)
MCHC: 28.9 g/dL — AB (ref 30.0–36.0)
MCV: 103 fL — ABNORMAL HIGH (ref 80.0–100.0)
PLATELETS: 190 10*3/uL (ref 150–400)
RBC: 2.69 MIL/uL — AB (ref 3.87–5.11)
RDW: 18.2 % — ABNORMAL HIGH (ref 11.5–15.5)
WBC: 3.3 10*3/uL — AB (ref 4.0–10.5)
nRBC: 0 % (ref 0.0–0.2)

## 2018-05-28 MED ORDER — MOMETASONE FURO-FORMOTEROL FUM 200-5 MCG/ACT IN AERO
2.0000 | INHALATION_SPRAY | Freq: Two times a day (BID) | RESPIRATORY_TRACT | Status: DC
  Filled 2018-05-28: qty 8.8

## 2018-05-28 MED ORDER — ESOMEPRAZOLE MAGNESIUM 40 MG PO CPDR
40.0000 mg | DELAYED_RELEASE_CAPSULE | Freq: Every day | ORAL | Status: DC
  Administered 2018-05-28: 40 mg via ORAL
  Filled 2018-05-28 (×2): qty 1

## 2018-05-28 MED ORDER — IPRATROPIUM-ALBUTEROL 0.5-2.5 (3) MG/3ML IN SOLN
3.0000 mL | Freq: Three times a day (TID) | RESPIRATORY_TRACT | Status: DC
  Administered 2018-05-28 – 2018-05-29 (×4): 3 mL via RESPIRATORY_TRACT
  Filled 2018-05-28 (×4): qty 3

## 2018-05-28 MED ORDER — SUCRALFATE 1 GM/10ML PO SUSP
1.0000 g | Freq: Three times a day (TID) | ORAL | Status: DC
  Administered 2018-05-28 – 2018-06-01 (×11): 1 g via ORAL
  Filled 2018-05-28 (×12): qty 10

## 2018-05-28 MED ORDER — IPRATROPIUM-ALBUTEROL 0.5-2.5 (3) MG/3ML IN SOLN
3.0000 mL | Freq: Four times a day (QID) | RESPIRATORY_TRACT | Status: DC
  Administered 2018-05-28: 3 mL via RESPIRATORY_TRACT
  Filled 2018-05-28: qty 3

## 2018-05-28 NOTE — Progress Notes (Signed)
   05/28/18 1250  Clinical Encounter Type  Visited With Patient and family together  Visit Type Initial  Referral From Nurse  Consult/Referral To Chaplain  The chaplain responded to request for spiritual care for Pt. Family.  The chaplain was updated by the RN-Maggie before the visit.  At the time of the visit the MD was visiting the Pt.  The Pt requested the chaplain leave her name. The chaplain left her name and will follow up with the Pt. and family before the end of the day.

## 2018-05-28 NOTE — Progress Notes (Signed)
PROGRESS NOTE    Lynn Ramirez  HUT:[id number] DOB: 11/30/1940 DOA: 05/23/2018 PCP: Midge Minium, MD   Brief Narrative: Patient is a 77 year old female with past medical history of COPD on 2 L oxygen at home, peritoneal carcinomatosis/fallopian tube malignancy with malignant ascites, who presented with complaints of nausea and vomiting from home.  There was concern for gastric outlet obstruction after she presented to her oncologist office.  Admitted for further evaluation.  GI consulted.   Assessment & Plan:   Principal Problem:   Nausea & vomiting Active Problems:   Metastatic adenocarcinoma (HCC)   Primary peritoneal carcinomatosis (HCC)   Malignant ascites   Malignant neoplasm of fallopian tube (HCC)   CAD (coronary artery disease)   Chronic respiratory failure with hypoxia (HCC)   COPD GOLD III D   Obesity (BMI 30-39.9)  Nausea/vomiting: Concern for gastric outlet obstruction due to her metastatic disease.  GI consulted for possible endoscopy/stent for GI outlet obstruction. Currently on clear liquid diet.  Continue Zofran, IV fluids  Metastatic adenocarcinoma: malignant  neoplasm of the fallopian tube, malignant ascites, peritoneal carcinomatosis.  She follows with Dr. Learta Codding.  Was on chemotherapy. Status post paracentesis for malignant ascites on 11/15.  Chronic respiratory failure/COPD: On home oxygen at 2 L.  She has bilateral expiratory wheezes.  Will restart her home inhaler and also put her on DuoNeb.  Anemia: Secondary to chronic disease .Continue to monitor CBC  Coronary Artery disease: Stable  DVT prophylaxis: SCD Code Status: Partial code Family Communication: Family members present at the bedside Disposition Plan: Home after full work-up   Consultants: GI, oncology  Procedures: None  Antimicrobials: None  Subjective: Patient seen and examined the bedside this morning.  Nausea and vomiting  slightly improved.  Denies any abdominal pain.   Hemodynamically stable.  Objective: Vitals:   06/04/2018 1734 06/03/2018 2021 05/28/18 0459  BP: 124/67 128/60 128/60  Pulse: 95 88 89  Resp: 18 17 19   Temp: 98 F (36.7 C) 98.5 F (36.9 C) 97.6 F (36.4 C)  TempSrc: Oral Oral Oral  SpO2: 100% 96% 97%    Intake/Output Summary (Last 24 hours) at 05/28/2018 1309 Last data filed at 05/28/2018 0500 Gross per 24 hour  Intake 747.18 ml  Output 75 ml  Net 672.18 ml   There were no vitals filed for this visit.  Examination:  General exam: Not in [place], chronically ill elderly female HEENT:PERRL,Oral mucosa moist, Ear/Nose normal on gross exam Respiratory system: Bilateral decreased entry, bilateral expiratory wheezes Cardiovascular system: S1 & S2 heard, RRR. No JVD, murmurs, rubs, gallops or clicks. No pedal edema.  Right-sided Port-A-Cath Gastrointestinal system: Abdomen is nondistended, soft and nontender. No organomegaly or masses felt. Normal bowel sounds heard. Central nervous system: Alert and oriented. No focal neurological deficits. Extremities: No edema, no clubbing ,no cyanosis, distal peripheral pulses palpable. Skin: No rashes, lesions or ulcers,no icterus ,no pallor MSK: Normal muscle bulk,tone ,power Psychiatry: Judgement and insight appear normal. Mood & affect appropriate.     Data Reviewed: I have personally reviewed following labs and imaging studies  CBC: Recent Labs  Lab 05/13/2018 1821 05/28/18 0500  WBC 4.2 3.3*  NEUTROABS 3.4  --   HGB 8.7* 8.0*  HCT 30.7* 27.7*  MCV 103.7* 103.0*  PLT 209 656   Basic Metabolic Panel: Recent Labs  Lab 05/21/2018 1821 05/28/18 0500  NA 138 141  K 3.4* 3.6  CL 94* 98  CO2 37* 37*  GLUCOSE 93 89  BUN 20  16  CREATININE 0.89 0.75  CALCIUM 8.7* 8.4*   GFR: Estimated Creatinine Clearance: 57 mL/min (by C-G formula based on SCr of 0.75 mg/dL). Liver Function Tests: Recent Labs  Lab 05/30/2018 1821  AST 12*  ALT 9  ALKPHOS 49  BILITOT 0.9    PROT 6.3*  ALBUMIN 2.6*   No results for input(s): LIPASE, AMYLASE in the last 168 hours. No results for input(s): AMMONIA in the last 168 hours. Coagulation Profile: No results for input(s): INR, PROTIME in the last 168 hours. Cardiac Enzymes: No results for input(s): CKTOTAL, CKMB, CKMBINDEX, TROPONINI in the last 168 hours. BNP (last 3 results) Recent Labs    12/17/17 1643  PROBNP 24.0   HbA1C: No results for input(s): HGBA1C in the last 72 hours. CBG: No results for input(s): GLUCAP in the last 168 hours. Lipid Profile: No results for input(s): CHOL, HDL, LDLCALC, TRIG, CHOLHDL, LDLDIRECT in the last 72 hours. Thyroid Function Tests: No results for input(s): TSH, T4TOTAL, FREET4, T3FREE, THYROIDAB in the last 72 hours. Anemia Panel: No results for input(s): VITAMINB12, FOLATE, FERRITIN, TIBC, IRON, RETICCTPCT in the last 72 hours. Sepsis Labs: No results for input(s): PROCALCITON, LATICACIDVEN in the last 168 hours.  No results found for this or any previous visit (from the past 240 hour(s)).       Radiology Studies: Dg Abd Portable 1v  Result Date: 06/01/2018 CLINICAL DATA:  Follow-up small bowel obstruction EXAM: PORTABLE ABDOMEN - 1 VIEW COMPARISON:  May 24, 2018 FINDINGS: There is a small left pleural effusion with underlying opacity. No evidence of bowel obstruction identified. Previous cholecystectomy. No free air, portal venous gas, or pneumatosis seen on the supine view. IMPRESSION: Small effusion and underlying opacity in the left lower chest. No acute abnormalities seen within the abdomen. No obstruction noted. Electronically Signed   By: Dorise Bullion III M.D   On: 05/17/2018 21:11        Scheduled Meds: . gabapentin  300 mg Oral QHS  . ipratropium-albuterol  3 mL Nebulization Q6H  . levothyroxine  50 mcg Oral Q0600  . metoCLOPramide (REGLAN) injection  5 mg Intravenous Q6H  . pantoprazole  40 mg Oral Daily  . pyridoxine  100 mg Oral Daily   . vitamin B-12  100 mcg Oral Daily   Continuous Infusions: . sodium chloride 75 mL/hr at 05/28/18 0500     LOS: 1 day    Time spent: 35 mins.More than 50% of that time was spent in counseling and/or coordination of care.      Shelly Coss, MD [facility] Pager [phone]  If 7PM-7AM, please contact night-coverage [email or web address] Password [email or web address] 05/28/2018, 1:09 PM

## 2018-05-28 NOTE — Progress Notes (Signed)
   05/28/18 1351  Clinical Encounter Type  Visited With Patient and family together  Visit Type Follow-up  Referral From Nurse  Consult/Referral To Chaplain  The chaplain followed up with request for RN request for Pt. and family spiritual care.  The chaplain was welcomed into room by Pt. Pt. introduced the chaplain to Pt daughter and husband.  The Pt. communicated her acceptance of MD visit and plan of care, hoping for discharge in a few days.  The Pt. has a relationship with Chaplain-BV from the [place]. The invited the Pt. and family to page the chaplain through the RN as needed for spiritual care.

## 2018-05-28 NOTE — Progress Notes (Signed)
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 She has been on multiple chemotherapy regimens starting in April of 2016 with the last chemotherapy being cycle 1 of doxil on 05/16/18 which she tolerated well per pt.  During that time, she underwent a robotic assisted hysterectomy, BSO, omentectomy and radical tumor debulking after three cycles of carboplatin and taxol on 12/29/14 with Dr. Everitt Amber. She had recurrences diagnosed first in 07/2015 and again in March 2018. Last CA 125 409.0 from 218 one month ago.  S: Patient resting in bed in no acute distress with family at the bedside.  States she started having episodes of nausea with emesis several weeks ago but she noticed an increase in frequency over the past nine days.  She states she would take in solid food and immediately she would vomit or in other cases 30-45 min later she would have emesis. Reports moderate abdominal pain on the left abdomen and reported relief after having a paracentesis but states she can tell the pain is coming back.   O: Alert, oriented x3. Lungs diminished in the bases. RRR.  Abdomen with firmness noted more on the left upper abdomen. Slightly tender on palpation. Moderate lower extremity edema, more on the left.  Pitting edema noted with the left. Per pt, she has had edema off and on but reports increase in left leg edema over the past two weeks.    P: Discussed edema with Dr. Benay Spice with no intervention at this time. Dr. Denman George to see patient later today. Continue plan of care per Hospitalists, GI, Dr. Benay Spice. GYN ONC will continue to follow.

## 2018-05-28 NOTE — Progress Notes (Signed)
Pt adamantly refuses to take dulera.  Med sent back to pharmacy per patient's request.

## 2018-05-28 NOTE — Progress Notes (Signed)
[facility] Progress Note   Lynn Ramirez [id number] [birth year] 77 y.o.  Lynn Ramirez is managed by Dr. Dominica Severin B. Sherrill   Actively treated with chemotherapy/immunotherapy: yes  Current Therapy: Doxil  Assessment: Plan:    Non-intractable vomiting with nausea, unspecified vomiting type - Plan: 0.9 %  sodium chloride infusion, prochlorperazine (COMPAZINE) injection 10 mg, dexamethasone (DECADRON) injection 10 mg, prochlorperazine (COMPAZINE) 10 MG tablet, DISCONTINUED: dexamethasone (DECADRON) 10 mg in sodium chloride 0.9 % 50 mL IVPB  Indigestion - Plan: alum & mag hydroxide-simeth (MAALOX/MYLANTA) 200-200-20 MG/5ML suspension 30 mL  Ascites, malignant - Plan: US Paracentesis  Port catheter in place - Plan: heparin lock flush 100 unit/mL, sodium chloride flush (NS) 0.9 % injection 10 mL, DISCONTINUED: heparin lock flush 100 unit/mL, DISCONTINUED: sodium chloride flush (NS) 0.9 % injection 10 mL  Malignant neoplasm of right ovary (HCC) - Plan: heparin lock flush 100 unit/mL, sodium chloride flush (NS) 0.9 % injection 10 mL, DISCONTINUED: heparin lock flush 100 unit/mL, DISCONTINUED: sodium chloride flush (NS) 0.9 % injection 10 mL   Nausea and vomiting: Patient was given 1 L of normal saline, Compazine 10 mg IV x1, Decadron 10 mg IV x1, and a prescription for Compazine 10 mg p.o. 4 times daily as needed.  A liquid diet was recommended.  GERD: The patient was given a GI cocktail while here today.  Malignant ascites: The patient was referred for a paracentesis tomorrow.  Metastatic malignant neoplasm of the right ovary: The patient is status post cycle 1 of Doxil.  She is scheduled to return on 06/03/2018 for follow-up.  Please see After Visit Summary for patient specific instructions.  Future Appointments  Date Time Provider [place]  06/03/2018 11:30 AM [facility] LAB 2 [facility] None  06/03/2018 11:45 AM [facility] INFUSION [facility] None    06/03/2018 12:15 PM [name], NP [facility] None  06/13/2018 10:40 AM Dorothy Spark, MD [facility]  06/25/2018 10:00 AM Collene Gobble, MD [facility] None  11/05/2018 10:00 AM Midge Minium, MD [facility]    Orders Placed This Encounter  Procedures  . US Paracentesis       Subjective:   Patient ID:  Lynn Ramirez is a 77 y.o. (DOB 1941-05-08) female.  Chief Complaint:  Chief Complaint  Patient presents with  . Nausea    HPI Lynn Ramirez is a 77 year old female with a history of a metastatic carcinoma of the fallopian tube who is managed by Dr. Dominica Severin B. Sherrill and is status post cycle 1 of Doxil.  The patient presents to the clinic today with nausea and vomiting x1 week.  She has tried Zofran and Reglan.  Her symptoms have worsened since Monday.  She has tried vinegar and and Zofran today without relief.  She is having swelling and pain in her abdomen.  She had been given a dose of Reglan which had initially helped with her nausea and vomiting.  It is now refractory to this intervention.  She has had diarrhea.  Today she had one small normal bowel movement.  Medications: I have reviewed the patient's current medications.  Allergies:  Allergies  Allergen Reactions  . Latex Rash    Latex gloves ONLY (pt cannot wear them- no reaction if someone else touches her with latex gloves on)  . Penicillins Other (See Comments)    welts Has patient had a PCN reaction causing immediate rash, facial/tongue/throat swelling, SOB or lightheadedness with hypotension: Yes  Has patient had a PCN reaction causing  severe rash involving mucus membranes or skin necrosis:No Has patient had a PCN reaction that required hospitalization:No Has patient had a PCN reaction occurring within the last 10 years:No If all of the above answers are "NO", then may proceed with Cephalosporin use.     Past Medical History:  Diagnosis Date  . CKD (chronic kidney disease),  stage II   . COPD (chronic obstructive pulmonary disease) ([place])   . Coronary artery calcification seen on CT scan   . Difficulty sleeping   . Eczema    hands  . Emphysema   . GERD (gastroesophageal reflux disease)   . H/O hydronephrosis   . History of transfusion    age 7  . Hyperlipidemia   . Hypertension   . Ovarian cancer ([place]) dx'd 09/2014   metastatic - prior malignant R pleural effusion and peritoneal carcinomatosis  . S/p nephrectomy     Past Surgical History:  Procedure Laterality Date  . ABDOMINAL HYSTERECTOMY    . CHOLECYSTECTOMY N/A 08/09/2015   Procedure: Attempted LAPAROSCOPIC coverted  open CHOLECYSTECTOMY WITH INTRAOPERATIVE CHOLANGIOGRAM;  Surgeon: Autumn Messing III, MD;  Location: [facility];  Service: General;  Laterality: N/A;  . HAND SURGERY Right 1980's  . LAPAROTOMY N/A 12/29/2014   Procedure:  LAPAROTOMY;  Surgeon: Everitt Amber, MD;  Location: [facility];  Service: Gynecology;  Laterality: N/A;  . NEPHRECTOMY    . ROBOTIC ASSISTED TOTAL HYSTERECTOMY WITH BILATERAL SALPINGO OOPHERECTOMY Bilateral 12/29/2014   Procedure: ROBOTIC ASSISTED TOTAL LAPAROSCOPIC HYSTERECTOMY WITH BILATERAL SALPINGO OOPHORECTOMY AND OOMENTECTOMY WITH RADICAL TUMOR [place] ;  Surgeon: Everitt Amber, MD;  Location: [facility];  Service: Gynecology;  Laterality: Bilateral;  . THORACENTESIS     several  . TONSILLECTOMY    . TUBAL LIGATION      Family History  Problem Relation Age of Onset  . Diabetes Father   . Lung cancer Father 43       metastasis to liver  . Cancer Father        liver  . Pancreatic cancer Mother 88  . Stroke Paternal Grandfather   . Heart disease Maternal Aunt   . Diabetes Paternal Uncle   . Heart Problems Maternal Grandmother   . Heart Problems Maternal Grandfather   . Diabetes Paternal Grandmother   . Stroke Paternal Grandmother   . Heart Problems Paternal Uncle   . Cancer Cousin        unknown type  . Breast cancer Cousin        dx. 21s  . Leukemia Cousin        dx.  16-17  . Colon cancer Neg Hx   . Stomach cancer Neg Hx     Social History   Socioeconomic History  . Marital status: Married    Spouse name: Not on file  . Number of children: 3  . Years of education: Not on file  . Highest education level: Not on file  Occupational History  . Occupation: RETIRED    Employer: RETIRED  Social Needs  . Financial resource strain: Not hard at all  . Food insecurity:    Worry: Never true    Inability: Never true  . Transportation needs:    Medical: No    Non-medical: No  Tobacco Use  . Smoking status: Former Smoker    Packs/day: 1.00    Years: 40.00    Pack years: 40.00    Types: Cigarettes    Last attempt to quit: 07/10/2013    Years since quitting:  4.8  . Smokeless tobacco: Never Used  Substance and Sexual Activity  . Alcohol use: Yes    Alcohol/week: 0.0 standard drinks    Comment: 1-2 a week  . Drug use: No  . Sexual activity: Never  Lifestyle  . Physical activity:    Days per week: 0 days    Minutes per session: 0 min  . Stress: Only a little  Relationships  . Social connections:    Talks on phone: More than three times a week    Gets together: More than three times a week    Attends religious service: More than 4 times per year    Active member of club or organization: Yes    Attends meetings of clubs or organizations: More than 4 times per year    Relationship status: Married  . Intimate partner violence:    Fear of current or ex partner: Not on file    Emotionally abused: Not on file    Physically abused: Not on file    Forced sexual activity: Not on file  Other Topics Concern  . Not on file  Social History Narrative  . Not on file    Past Medical History, Surgical history, Social history, and Family history were reviewed and updated as appropriate.   Please see review of systems for further details on the patient's review from today.   Review of Systems:  Review of Systems  Constitutional: Positive for appetite  change. Negative for activity change, chills, diaphoresis and fever.  HENT: Negative for trouble swallowing.   Respiratory: Negative for cough, choking, chest tightness, shortness of breath and wheezing.   Cardiovascular: Negative for chest pain, palpitations and leg swelling.  Gastrointestinal: Positive for abdominal distention, abdominal pain, diarrhea, nausea and vomiting. Negative for constipation.  Genitourinary: Negative for decreased urine volume.  Neurological: Negative for headaches.    Objective:   Physical Exam:  BP 133/77 (BP Location: Right Arm, Patient Position: Sitting)   Pulse 99   Temp 97.9 F (36.6 C) (Oral)   Resp 18   Ht 5\' 1"  (1.549 m)   Wt 174 lb 8 oz (79.2 kg)   SpO2 95%   BMI 32.97 kg/m  ECOG: 1  Physical Exam  Constitutional: No distress.  HENT:  Head: Normocephalic and atraumatic.  Mouth/Throat: No oropharyngeal exudate.  Neck: Normal range of motion. Neck supple.  Cardiovascular: Normal rate, regular rhythm and normal heart sounds. Exam reveals no gallop and no friction rub.  No murmur heard. Pulmonary/Chest: Effort normal and breath sounds normal. No respiratory distress. She has no wheezes. She has no rales.  Abdominal: Soft. Bowel sounds are normal. She exhibits no distension and no mass. There is tenderness (Diffuse lower abdominal and pelvic tenderness.). There is no rebound and no guarding.  Lymphadenopathy:    She has no cervical adenopathy.  Neurological: She is alert. Coordination normal.  Skin: Skin is warm and dry. No rash noted. She is not diaphoretic. No erythema.  Psychiatric: She has a normal mood and affect. Her behavior is normal. Judgment and thought content normal.    Lab Review:     Component Value Date/Time   NA 141 05/28/2018 0500   NA 137 07/04/2017 1122   K 3.6 05/28/2018 0500   K 4.1 07/04/2017 1122   CL 98 05/28/2018 0500   CO2 37 (H) 05/28/2018 0500   CO2 30 (H) 07/04/2017 1122   GLUCOSE 89 05/28/2018 0500    GLUCOSE 104 07/04/2017 1122   BUN  16 05/28/2018 0500   BUN 15.8 07/04/2017 1122   CREATININE 0.75 05/28/2018 0500   CREATININE 0.94 05/15/2018 1138   CREATININE 0.9 07/04/2017 1122   CALCIUM 8.4 (L) 05/28/2018 0500   CALCIUM 9.4 07/04/2017 1122   PROT 6.3 (L) 05/11/2018 1821   PROT 7.0 07/04/2017 1122   ALBUMIN 2.6 (L) 06/08/2018 1821   ALBUMIN 3.4 (L) 07/04/2017 1122   AST 12 (L) 05/12/2018 1821   AST 18 05/15/2018 1138   AST 13 07/04/2017 1122   ALT 9 05/16/2018 1821   ALT 7 05/15/2018 1138   ALT 10 07/04/2017 1122   ALKPHOS 49 06/08/2018 1821   ALKPHOS 73 07/04/2017 1122   BILITOT 0.9 06/04/2018 1821   BILITOT 0.3 05/15/2018 1138   BILITOT 0.49 07/04/2017 1122   GFRNONAA >60 05/28/2018 0500   GFRNONAA 58 (L) 05/15/2018 1138   GFRAA >60 05/28/2018 0500   GFRAA >60 05/15/2018 1138       Component Value Date/Time   WBC 3.3 (L) 05/28/2018 0500   RBC 2.69 (L) 05/28/2018 0500   HGB 8.0 (L) 05/28/2018 0500   HGB 9.0 (L) 05/15/2018 1138   HGB 11.5 (L) 07/04/2017 1122   HCT 27.7 (L) 05/28/2018 0500   HCT 35.7 07/04/2017 1122   PLT 190 05/28/2018 0500   PLT 355 05/15/2018 1138   PLT 298 07/04/2017 1122   MCV 103.0 (H) 05/28/2018 0500   MCV 92.2 07/04/2017 1122   MCH 29.7 05/28/2018 0500   MCHC 28.9 (L) 05/28/2018 0500   RDW 18.2 (H) 05/28/2018 0500   RDW 19.8 (H) 07/04/2017 1122   LYMPHSABS 0.6 (L) 05/16/2018 1821   LYMPHSABS 1.1 07/04/2017 1122   MONOABS 0.1 05/29/2018 1821   MONOABS 0.6 07/04/2017 1122   EOSABS 0.1 05/12/2018 1821   EOSABS 0.2 07/04/2017 1122   BASOSABS 0.0 05/21/2018 1821   BASOSABS 0.0 07/04/2017 1122   -------------------------------  Imaging from last 24 hours (if applicable):  Radiology interpretation: Ct Abdomen Pelvis Wo Contrast  Result Date: 05/21/2018 CLINICAL DATA:  Acute onset of nausea, vomiting and generalized weakness. EXAM: CT ABDOMEN AND PELVIS WITHOUT CONTRAST TECHNIQUE: Multidetector CT imaging of the abdomen and pelvis  was performed following the standard protocol without IV contrast. COMPARISON:  CT of the abdomen and pelvis performed 05/05/2018 FINDINGS: Lower chest: A small left pleural effusion is noted, with associated atelectasis. Trace right-sided pleural fluid is seen. Scattered coronary artery calcifications are seen. Hepatobiliary: Mildly complex perihepatic ascites is again noted. The liver is grossly stable in appearance. The patient is status post cholecystectomy, with clips noted at the gallbladder fossa. Pancreas: The pancreas is within normal limits. Spleen: The spleen is unremarkable in appearance. Adrenals/Urinary Tract: The adrenal glands are unremarkable in appearance. The patient is status post left-sided nephrectomy. A tiny hyperdense cyst is noted at the lower pole of the right kidney. Minimal right-sided hydronephrosis may reflect the patient's baseline. No renal or ureteral stones are identified. Nonspecific perinephric stranding is noted about the right kidney. Stomach/Bowel: The stomach is unremarkable in appearance. The small bowel is within normal limits. The appendix is not visualized; there is no evidence for appendicitis. The colon is unremarkable in appearance. Vascular/Lymphatic: Diffuse calcification is seen along the abdominal aorta and its branches. The abdominal aorta is otherwise grossly unremarkable. The inferior vena cava is grossly unremarkable. No retroperitoneal lymphadenopathy is seen. No pelvic sidewall lymphadenopathy is identified. Reproductive: The bladder is mildly distended. Apparent bladder wall thickening may reflect relative decompression. The patient is status  post hysterectomy. No suspicious adnexal masses are seen. Other: Prominent somewhat loculated ascites is seen within the abdomen and pelvis, tracking about small bowel loops and along the lesser curvature of the stomach. Musculoskeletal: No acute osseous abnormalities are identified. The visualized musculature is  unremarkable in appearance. IMPRESSION: 1. Prominent somewhat loculated ascites within the abdomen and pelvis, tracking about small bowel loops and along the lesser curvature of the stomach. 2. Mildly complex perihepatic ascites again noted. 3. Small left pleural effusion, with associated atelectasis. Trace right-sided pleural fluid seen. 4. Scattered coronary artery calcifications. 5. Tiny hyperdense cyst at the lower pole of the right kidney. Minimal chronic right-sided hydronephrosis may reflect the patient's baseline. Aortic Atherosclerosis (ICD10-I70.0). Electronically Signed   By: Garald Balding M.D.   On: 05/21/2018 04:04   Ct Chest Wo Contrast  Result Date: 05/06/2018 CLINICAL DATA:  Abdominal pain. Chronic cough, diagnosed with pneumonia 6 weeks ago. History of ovarian cancer. EXAM: CT CHEST WITHOUT CONTRAST TECHNIQUE: Multidetector CT imaging of the chest was performed following the standard protocol without IV contrast. COMPARISON:  Chest CT 03/23/2018. Abdominal CT 05/05/2018 and 03/03/2018. FINDINGS: Cardiovascular: Atherosclerosis of the aorta, great vessels and coronary arteries. No acute vascular findings are apparent on noncontrast imaging. Right IJ Port-A-Cath extends to the mid SVC. The heart size is normal. There is a trace pericardial effusion versus mild pericardial thickening. Mediastinum/Nodes: Borderline 10 mm precarinal node on image 61/2 is stable. There are no other enlarged mediastinal, hilar or axillary lymph nodes. The thyroid gland, trachea and esophagus demonstrate no significant findings. Lungs/Pleura: Left greater than right dependent pleural effusions have enlarged over the last 6 weeks, but have not significantly changed from yesterday. No pleural based nodularity is identified. There is emphysema with central airway thickening and linear bibasilar atelectasis. Components in the left lower lobe and lingula have mildly progressed, although appear linear on the reformatted  images. There is no endobronchial lesion, confluent airspace opacity or suspicious pulmonary nodule. Upper abdomen: As seen on recent abdominal CT, there is loculated ascites in the upper abdomen with scalloping of the hepatic contours and a prominent component in the lesser sac. Musculoskeletal/Chest wall: There is no chest wall mass or suspicious osseous finding. IMPRESSION: 1. Since previous chest CT of 6 weeks ago, there are enlarging left-greater-than-right pleural effusions with increasing linear atelectasis in the left lower lobe and lingula. No confluent airspace opacity or suspicious pulmonary nodule identified. 2. No pleural based nodularity is identified. However, as seen on recent abdominal CT, there is loculated ascites in the upper abdomen which remains moderately suspicious for peritoneal carcinomatosis in this patient with a history of ovarian cancer. 3. No thoracic adenopathy. 4. Coronary and Aortic Atherosclerosis (ICD10-I70.0). Electronically Signed   By: Richardean Sale M.D.   On: 05/06/2018 16:18   Ct Abdomen Pelvis W Contrast  Result Date: 05/05/2018 CLINICAL DATA:  Abdominal distention EXAM: CT ABDOMEN AND PELVIS WITH CONTRAST TECHNIQUE: Multidetector CT imaging of the abdomen and pelvis was performed using the standard protocol following bolus administration of intravenous contrast. CONTRAST:  1100mL ISOVUE-300 IOPAMIDOL (ISOVUE-300) INJECTION 61% COMPARISON:  03/03/2018 FINDINGS: Lower chest: There has been slight interval increase in small bilateral pleural effusions, left greater than right. Heart size is top normal without pericardial effusion or thickening. Small hiatal hernia is present. Hepatobiliary: Perihepatic ascites is redemonstrated. No space-occupying mass. Cholecystectomy clips are present. Pancreas: Unremarkable. No pancreatic ductal dilatation or surrounding inflammatory changes. Spleen: Normal in size without focal abnormality. Adrenals/Urinary Tract: Absent left kidney.  A rotated appearance of the right kidney with renal pelvis facing posterior medially. Bladder is decompressed. No nephrolithiasis nor hydroureteronephrosis. Stomach/Bowel: Centralized small bowel loops secondary to moderate volume of ascites. The ascites is noted to scalp the liver surface as before and may represent a malignant ascites/peritoneal disease. No peritoneal thickening or caking is apparent however. Redemonstration diffuse distal jejunal and ileal small bowel submucosal edema and fatty replacement. Redemonstration of descending and sigmoid diverticulosis without acute diverticulitis. Vascular/Lymphatic: Aortoiliac atherosclerosis. Small mesenteric and retroperitoneal lymph nodes without pathologic enlargement. Reproductive: Hysterectomy.  No adnexal mass. Other: Volume ascites as above.  Moderate Musculoskeletal: No acute nor aggressive osseous findings. IMPRESSION: 1. Diffuse submucosal fatty replacement and edema of distal jejunal and ileal loops, centralized in appearance due to moderate volume of ascites. Findings are similar to that seen previously and may reflect stigmata chronic inflammatory bowel disease or possibly small bowel enteritis. Sympathetic thickening due to ascites is also possibility but would expect more diffuse involvement. 2. Moderate volume of ascites similar to prior with scalloped appearance of the liver surface. Findings may represent a malignant ascites. No associated omental caking is apparent however. 3. Surgically absent left kidney.  Cholecystectomy.  Hysterectomy. 4. Slight increase in small pleural effusions, left greater than right. 5. Colonic diverticulosis without acute diverticulitis. Electronically Signed   By: Ashley Royalty M.D.   On: 05/05/2018 16:56   US Paracentesis  Result Date: 05/24/2018 INDICATION: Patient with history of fallopian tube carcinoma with recurrent malignant ascites. Request made for therapeutic paracentesis. EXAM: ULTRASOUND GUIDED  THERAPEUTIC PARACENTESIS MEDICATIONS: None COMPLICATIONS: None immediate. PROCEDURE: Informed written consent was obtained from the patient after a discussion of the risks, benefits and alternatives to treatment. A timeout was performed prior to the initiation of the procedure. Initial ultrasound scanning demonstrates a small to moderate amount of ascites within the left lower abdominal quadrant. The left lower abdomen was prepped and draped in the usual sterile fashion. 1% lidocaine was used for local anesthesia. Following this, a 19 gauge, 10-cm, Yueh catheter was introduced. An ultrasound image was saved for documentation purposes. The paracentesis was performed. The catheter was removed and a dressing was applied. The patient tolerated the procedure well without immediate post procedural complication. FINDINGS: A total of approximately 1.3 liters of yellow fluid was removed. IMPRESSION: Successful ultrasound-guided therapeutic paracentesis yielding 1.3 liters of peritoneal fluid. Read by: Rowe Robert, PA-C Electronically Signed   By: Jacqulynn Cadet M.D.   On: 05/24/2018 15:48   US Paracentesis  Result Date: 05/10/2018 INDICATION: Patient with history of fallopian tube carcinoma with recurrent malignant ascites. Request made for therapeutic paracentesis. EXAM: ULTRASOUND GUIDED THERAPEUTIC PARACENTESIS MEDICATIONS: None COMPLICATIONS: None immediate. PROCEDURE: Informed written consent was obtained from the patient after a discussion of the risks, benefits and alternatives to treatment. A timeout was performed prior to the initiation of the procedure. Initial ultrasound scanning demonstrates a small amount of ascites within the left lower abdominal quadrant. The left lower abdomen was prepped and draped in the usual sterile fashion. 1% lidocaine was used for local anesthesia. Following this, a 19 gauge, 10-cm, Yueh catheter was introduced. An ultrasound image was saved for documentation purposes. The  paracentesis was performed. The catheter was removed and a dressing was applied. The patient tolerated the procedure well without immediate post procedural complication. FINDINGS: A total of approximately 900 cc of yellow fluid was removed. IMPRESSION: Successful ultrasound-guided therapeutic paracentesis yielding 900 cc of peritoneal fluid. Read by: Rowe Robert, PA-C Electronically Signed  By: Aletta Edouard M.D.   On: 05/10/2018 15:17   Dg Abd 2 Views  Result Date: 05/24/2018 CLINICAL DATA:  Upper abdominal pain, nausea and vomiting. Ovarian cancer. EXAM: ABDOMEN - 2 VIEW COMPARISON:  05/21/2018 FINDINGS: Pleural effusions present bilaterally, larger on the left with basilar atelectasis. No free air. Nonobstructive bowel gas pattern. Degenerative changes of the spine. Bones are osteopenic. IMPRESSION: Negative for obstruction or free air. Pleural effusions and basilar atelectasis, worse on the left Electronically Signed   By: Jerilynn Mages.  Shick M.D.   On: 05/24/2018 15:37   Dg Abd Portable 1v  Result Date: 05/11/2018 CLINICAL DATA:  Follow-up small bowel obstruction EXAM: PORTABLE ABDOMEN - 1 VIEW COMPARISON:  May 24, 2018 FINDINGS: There is a small left pleural effusion with underlying opacity. No evidence of bowel obstruction identified. Previous cholecystectomy. No free air, portal venous gas, or pneumatosis seen on the supine view. IMPRESSION: Small effusion and underlying opacity in the left lower chest. No acute abnormalities seen within the abdomen. No obstruction noted. Electronically Signed   By: Dorise Bullion III M.D   On: 05/24/2018 21:11        This patient was seen with Dr. Benay Spice with my treatment plan reviewed with him. He expressed agreement with my medical management of this patient.  This was a shared visit with Sandi Mealy.  Ms. Mcnamara was interviewed and examined.  We are concerned she is developing a bowel or gastric outlet obstruction.  She received intravenous fluids  today.  She will be referred for a paracentesis tomorrow.  I doubt her symptoms are related to the Doxil chemotherapy.  Julieanne Manson, MD

## 2018-05-28 NOTE — Consult Note (Addendum)
Referring Provider: [facility]    Primary Care Physician:  Midge Minium, MD Primary Gastroenterologist:  Previously Erskine Emery, MD     Reason for Consultation:  Nausea / vomiting     ASSESSMENT    1. 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   2. Progressive nausea and vomiting. Admitted out of concern for GOO or bowel obstruction but imaging negative for either. Zofran no longer controlling symptoms.  -She cannot correlate progressive N/V with any meds.  -She had 1.3 liters of ascitic fluid removed on Friday and that didn't help.  -Etiology of progressive N/V unclear. Patient motivated to get back and resume chemotherapy.  -I assume there is low risk of brain met? -Not unreasonable to perform an EGD though patient and daughter understand that it may not provide the answers.  -continue Zofran and reglan  3. GERD. Having breakthrough burning in throat. The vomiting has probably irritated her esophagus.  -advised her to stay at 45 degree in bed -continue daily PPI -renal function is okay, will add Carafate TID ac    Attending Physician's note   I have taken a history, examined the patient and reviewed the chart. I agree with the Advanced Practitioner's note, impression and recommendations.   Refractory nausea and vomiting in setting of metastatic gyn malignancy with carcinomatosis. No imaging evidence of SBO or GOO. R/O ulcer, esophagitis. Suspect carcinomatosis is negatively impacting small bowel and/or gastric motility. Continue PPI for GERD symptoms which could be due to recurrent vomiting. Optimize antiemetic regimen.   Lucio Edward, MD [facility] [phone]     PLAN:    HPI: Lynn Ramirez is a 77 y.o. female with COPD on home 26. 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 She has been struggling with intermittent nausea / vomiting for weeks. CT  scan for the  N/V on 11/12 was negative for bowel obstruction. The nausea / vomiting was able to be managed with Zofran until about a week ago when she began being unable to even hold down liquids. She was seen by Oncologist yesterday with N/V and decision was made to admit for further evaluation.   The nausea and vomiting is mainly within 30-45 minutes after any PO intake. If eats solids she will have undigested food in the emesis. She rarely takes narcotics. No new meds other than thyroid medication. She did start chemo recently but N/V doesn't really correlate with treatment. Her BMs have been relatively normal considering the decreased PO intake. No NSAID use. She had a paracentesis on Friday but it obviously didn't alleviate the N/V. She takes a daily PPI but having breakthrough burning in her throat recently  Data reviewed:  05/14/2018  KUB neg for bowel obstruction 05/21/18 non-contrast CTAP negative for obstruction   Past Medical History:  Diagnosis Date  . CKD (chronic kidney disease), stage II   . COPD (chronic obstructive pulmonary disease) ([place])   . Coronary artery calcification seen on CT scan   . Difficulty sleeping   . Eczema    hands  . Emphysema   . GERD (gastroesophageal reflux disease)   . H/O hydronephrosis   . History of transfusion    age 39  . Hyperlipidemia   . Hypertension   . Ovarian cancer ([place]) dx'd 09/2014   metastatic - prior malignant R pleural effusion and peritoneal carcinomatosis  . S/p nephrectomy     Past Surgical History:  Procedure  Laterality Date  . ABDOMINAL HYSTERECTOMY    . CHOLECYSTECTOMY N/A 08/09/2015   Procedure: Attempted LAPAROSCOPIC coverted  open CHOLECYSTECTOMY WITH INTRAOPERATIVE CHOLANGIOGRAM;  Surgeon: Autumn Messing III, MD;  Location: [facility];  Service: General;  Laterality: N/A;  . HAND SURGERY Right 1980's  . LAPAROTOMY N/A 12/29/2014   Procedure:  LAPAROTOMY;  Surgeon: Everitt Amber, MD;  Location: [facility];  Service: Gynecology;   Laterality: N/A;  . NEPHRECTOMY    . ROBOTIC ASSISTED TOTAL HYSTERECTOMY WITH BILATERAL SALPINGO OOPHERECTOMY Bilateral 12/29/2014   Procedure: ROBOTIC ASSISTED TOTAL LAPAROSCOPIC HYSTERECTOMY WITH BILATERAL SALPINGO OOPHORECTOMY AND OOMENTECTOMY WITH RADICAL TUMOR Melvina ;  Surgeon: Everitt Amber, MD;  Location: [facility];  Service: Gynecology;  Laterality: Bilateral;  . THORACENTESIS     several  . TONSILLECTOMY    . TUBAL LIGATION      Prior to Admission medications   Medication Sig Start Date End Date Taking? Authorizing Provider  acetaminophen (TYLENOL) 650 MG CR tablet Take 650 mg by mouth every 8 (eight) hours as needed for pain.   Yes [provider]  albuterol (PROAIR HFA) 108 (90 Base) MCG/ACT inhaler Inhale 2 puffs into the lungs 4 (four) times daily as needed for wheezing. 05/25/2018  Yes Collene Gobble, MD  albuterol (PROVENTIL) (2.5 MG/3ML) 0.083% nebulizer solution INHALE THE CONTENTS OF 1 VIAL VIA NEBULIZER EVERY 6 HOURS AS NEEDED FOR WHEEZING OR SHORTNESS OF BREATH Patient taking differently: Take 2.5 mg by nebulization every 6 (six) hours as needed for wheezing or shortness of breath.  02/25/18  Yes Collene Gobble, MD  antiseptic oral rinse (BIOTENE) LIQD 15 mLs by Mouth Rinse route 3 (three) times daily.   Yes [provider]  Biotin 2500 MCG CAPS Take 1 tablet by mouth daily.   Yes [provider]  carvedilol (COREG) 3.125 MG tablet TAKE 1 TABLET(3.125 MG) BY MOUTH TWICE DAILY WITH A MEAL Patient taking differently: Take 3.125 mg by mouth 2 (two) times daily with a meal.  11/27/17  Yes Dorothy Spark, MD  cholecalciferol (VITAMIN D) 1000 units tablet Take 1,000 Units by mouth daily.   Yes [provider]  esomeprazole (NEXIUM) 40 MG capsule Take 40 mg by mouth daily at 12 noon.   Yes [provider]  Fluticasone Furoate (ARNUITY ELLIPTA) 200 MCG/ACT AEPB Inhale 1 puff into the lungs daily. 04/01/18  Yes Collene Gobble, MD    furosemide (LASIX) 40 MG tablet Take 1 tablet (40 mg total) by mouth daily. 12/10/17  Yes Dorothy Spark, MD  gabapentin (NEURONTIN) 300 MG capsule TAKE 1 CAPSULE BY MOUTH EVERY NIGHT AT BEDTIME 03/07/18  Yes Ladell Pier, MD  levothyroxine (SYNTHROID, LEVOTHROID) 50 MCG tablet Take 1 tablet (50 mcg total) by mouth daily. 04/03/18  Yes Midge Minium, MD  lidocaine-prilocaine (EMLA) cream Apply to port site one hour prior to use. Do not rub in. Cover with plastic. 10/12/16  Yes Ladell Pier, MD  loratadine (CLARITIN) 10 MG tablet Take 10 mg by mouth daily.   Yes [provider]  LORazepam (ATIVAN) 0.5 MG tablet Place 1 tablet (0.5 mg total) under the tongue every 8 (eight) hours as needed (nausea). 05/24/18  Yes Ladell Pier, MD  ondansetron (ZOFRAN-ODT) 4 MG disintegrating tablet Take 1 tablet (4 mg total) by mouth every 8 (eight) hours as needed for nausea or vomiting. 12/05/17  Yes Ladell Pier, MD  Potassium Chloride ER 20 MEQ TBCR TAKE 1/2 TABLET BY MOUTH  DAILY Patient taking differently: Take 10 mg by mouth daily.  03/19/18  Yes Ladell Pier, MD  pyridoxine (B-6) 100 MG tablet Take 100 mg by mouth daily.   Yes [provider]  STIOLTO RESPIMAT 2.5-2.5 MCG/ACT AERS INHALE 2 PUFFS INTO THE LUNGS DAILY Patient taking differently: Take 2 puffs by mouth daily.  09/11/17  Yes Collene Gobble, MD  vitamin B-12 (CYANOCOBALAMIN) 100 MCG tablet Take 100 mcg by mouth daily. Reported on 01/19/2016   Yes [provider]  zolpidem (AMBIEN) 5 MG tablet Take 1 tablet (5 mg total) by mouth at bedtime as needed for sleep. 08/01/17  Yes [name], NP  OXYGEN Inhale 2 L into the lungs.    [provider]  prochlorperazine (COMPAZINE) 10 MG tablet Take 1 tablet (10 mg total) by mouth every 6 (six) hours as needed for nausea or vomiting. Patient not taking: Reported on 05/12/2018 05/23/18   Harle Stanford., PA-C    Current Facility-Administered  Medications  Medication Dose Route Frequency Provider Last Rate Last Dose  . 0.9 %  sodium chloride infusion   Intravenous Continuous Annita Brod, MD 75 mL/hr at 05/28/18 0500    . acetaminophen (TYLENOL) tablet 650 mg  650 mg Oral Q6H PRN Annita Brod, MD       Or  . acetaminophen (TYLENOL) suppository 650 mg  650 mg Rectal Q6H PRN Annita Brod, MD      . albuterol (PROVENTIL) (2.5 MG/3ML) 0.083% nebulizer solution 2.5 mg  2.5 mg Nebulization Q6H PRN Annita Brod, MD      . gabapentin (NEURONTIN) capsule 300 mg  300 mg Oral QHS Annita Brod, MD   300 mg at 06/04/2018 2050  . ipratropium-albuterol (DUONEB) 0.5-2.5 (3) MG/3ML nebulizer solution 3 mL  3 mL Nebulization Q6H Adhikari, Amrit, MD      . levothyroxine (SYNTHROID, LEVOTHROID) tablet 50 mcg  50 mcg Oral Q0600 Annita Brod, MD   50 mcg at 05/28/18 [phone]  . LORazepam (ATIVAN) tablet 0.5 mg  0.5 mg Sublingual Q8H PRN Annita Brod, MD   0.5 mg at 05/28/18 0958  . metoCLOPramide (REGLAN) injection 5 mg  5 mg Intravenous Q6H Annita Brod, MD      . ondansetron [facility]) tablet 4 mg  4 mg Oral Q6H PRN Annita Brod, MD       Or  . ondansetron [facility]) injection 4 mg  4 mg Intravenous Q6H PRN Annita Brod, MD   4 mg at 05/28/18 0705  . pantoprazole (PROTONIX) EC tablet 40 mg  40 mg Oral Daily Annita Brod, MD   40 mg at 05/28/18 1100  . polyethylene glycol (MIRALAX / GLYCOLAX) packet 17 g  17 g Oral Daily PRN Annita Brod, MD      . pyridOXINE (VITAMIN B-6) tablet 100 mg  100 mg Oral Daily Annita Brod, MD      . vitamin B-12 (CYANOCOBALAMIN) tablet 100 mcg  100 mcg Oral Daily Annita Brod, MD      . zolpidem (AMBIEN) tablet 5 mg  5 mg Oral QHS PRN Annita Brod, MD        Allergies as of 05/28/2018 - Review Complete 05/11/2018  Allergen Reaction Noted  . Latex Rash 11/02/2014  . Penicillins Other (See Comments) 03/06/2012    Family History  Problem  Relation Age of Onset  . Diabetes Father   . Lung cancer Father 73  metastasis to liver  . Cancer Father        liver  . Pancreatic cancer Mother 75  . Stroke Paternal Grandfather   . Heart disease Maternal Aunt   . Diabetes Paternal Uncle   . Heart Problems Maternal Grandmother   . Heart Problems Maternal Grandfather   . Diabetes Paternal Grandmother   . Stroke Paternal Grandmother   . Heart Problems Paternal Uncle   . Cancer Cousin        unknown type  . Breast cancer Cousin        dx. 30s  . Leukemia Cousin        dx. 16-17  . Colon cancer Neg Hx   . Stomach cancer Neg Hx     Social History   Socioeconomic History  . Marital status: Married    Spouse name: Not on file  . Number of children: 3  . Years of education: Not on file  . Highest education level: Not on file  Occupational History  . Occupation: RETIRED    Employer: RETIRED  Social Needs  . Financial resource strain: Not hard at all  . Food insecurity:    Worry: Never true    Inability: Never true  . Transportation needs:    Medical: No    Non-medical: No  Tobacco Use  . Smoking status: Former Smoker    Packs/day: 1.00    Years: 40.00    Pack years: 40.00    Types: Cigarettes    Last attempt to quit: 07/10/2013    Years since quitting: 4.8  . Smokeless tobacco: Never Used  Substance and Sexual Activity  . Alcohol use: Yes    Alcohol/week: 0.0 standard drinks    Comment: 1-2 a week  . Drug use: No  . Sexual activity: Never  Lifestyle  . Physical activity:    Days per week: 0 days    Minutes per session: 0 min  . Stress: Only a little  Relationships  . Social connections:    Talks on phone: More than three times a week    Gets together: More than three times a week    Attends religious service: More than 4 times per year    Active member of club or organization: Yes    Attends meetings of clubs or organizations: More than 4 times per year    Relationship status: Married  . Intimate  partner violence:    Fear of current or ex partner: Not on file    Emotionally abused: Not on file    Physically abused: Not on file    Forced sexual activity: Not on file  Other Topics Concern  . Not on file  Social History Narrative  . Not on file    Review of Systems: All systems reviewed and negative except where noted in HPI.  Physical Exam: Vital signs in last 24 hours: Temp:  [97.5 F (36.4 C)-98.5 F (36.9 C)] 97.6 F (36.4 C) (11/19 0459) Pulse Rate:  [88-95] 89 (11/19 0459) Resp:  [17-19] 19 (11/19 0459) BP: (124-136)/(60-72) 128/60 (11/19 0459) SpO2:  [96 %-100 %] 97 % (11/19 0459) Weight:  [76.8 kg] 76.8 kg (11/18 1454) Last BM Date: 05/28/18 General:   Alert, well-developed, female in NAD Psych:  Pleasant, cooperative. Normal mood and affect. Eyes:  Pupils equal, sclera clear, no icterus.   Conjunctiva pink. Ears:  Normal auditory acuity. Nose:  No deformity, discharge,  or lesions. Neck:  Supple; no masses Lungs:  Clear throughout   to auscultation.  Heart:  Regular rate and rhythm; no murmurs, no lower extremity edema Abdomen:  Soft except in mid abdomen above umbilicus. That area is slightly firm and tender. BS active, no palp mass    Rectal:  Deferred  Msk:  Symmetrical without gross deformities. . Neurologic:  Alert and  oriented x4;  grossly normal neurologically. Skin:  Intact without significant lesions or rashes.   Intake/Output from previous day: 11/18 0701 - 11/19 0700 In: 747.2 [I.V.:747.2] Out: 75 [Urine:75] Intake/Output this shift: No intake/output data recorded.  Lab Results: Recent Labs    06/08/2018 1821 05/28/18 0500  WBC 4.2 3.3*  HGB 8.7* 8.0*  HCT 30.7* 27.7*  PLT 209 190   BMET Recent Labs    05/11/2018 1821 05/28/18 0500  NA 138 141  K 3.4* 3.6  CL 94* 98  CO2 37* 37*  GLUCOSE 93 89  BUN 20 16  CREATININE 0.89 0.75  CALCIUM 8.7* 8.4*   LFT Recent Labs    06/06/2018 1821  PROT 6.3*  ALBUMIN 2.6*  AST 12*  ALT 9   ALKPHOS 49  BILITOT 0.9     Studies/Results: Dg Abd Portable 1v  Result Date: 05/15/2018 CLINICAL DATA:  Follow-up small bowel obstruction EXAM: PORTABLE ABDOMEN - 1 VIEW COMPARISON:  May 24, 2018 FINDINGS: There is a small left pleural effusion with underlying opacity. No evidence of bowel obstruction identified. Previous cholecystectomy. No free air, portal venous gas, or pneumatosis seen on the supine view. IMPRESSION: Small effusion and underlying opacity in the left lower chest. No acute abnormalities seen within the abdomen. No obstruction noted. Electronically Signed   By: Dorise Bullion III M.D   On: 05/13/2018 21:11     Tye Savoy, NP-C @  05/28/2018, 12:24 PM

## 2018-05-28 NOTE — Progress Notes (Signed)
IP PROGRESS NOTE  Subjective:   Lynn Ramirez continues to have nausea, but reports she obtained relief of nausea with IV Zofran this morning. She had a small bowel movement yesterday.  Objective: Vital signs in last 24 hours: Blood pressure 128/60, pulse 89, temperature 97.6 F (36.4 C), temperature source Oral, resp. rate 19, SpO2 97 %.  Intake/Output from previous day: 11/18 0701 - 11/19 0700 In: 747.2 [I.V.:747.2] Out: 75 [Urine:75]  Physical Exam:  HEENT: The mouth is dry, no thrush  Abdomen: Distended and firm Extremities: Trace edema at the lower legs   Portacath/PICC-without erythema  Lab Results: Recent Labs    05/22/2018 1821 05/28/18 0500  WBC 4.2 3.3*  HGB 8.7* 8.0*  HCT 30.7* 27.7*  PLT 209 190    BMET Recent Labs    05/28/2018 1821 05/28/18 0500  NA 138 141  K 3.4* 3.6  CL 94* 98  CO2 37* 37*  GLUCOSE 93 89  BUN 20 16  CREATININE 0.89 0.75  CALCIUM 8.7* 8.4*    No results found for: CEA1  Studies/Results: Dg Abd Portable 1v  Result Date: 06/01/2018 CLINICAL DATA:  Follow-up small bowel obstruction EXAM: PORTABLE ABDOMEN - 1 VIEW COMPARISON:  May 24, 2018 FINDINGS: There is a small left pleural effusion with underlying opacity. No evidence of bowel obstruction identified. Previous cholecystectomy. No free air, portal venous gas, or pneumatosis seen on the supine view. IMPRESSION: Small effusion and underlying opacity in the left lower chest. No acute abnormalities seen within the abdomen. No obstruction noted. Electronically Signed   By: Dorise Bullion III M.D   On: 05/26/2018 21:11    Medications: I have reviewed the patient's current medications.  Assessment/Plan:  1. 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 Significant improvement in malignant right pleural effusion.  Status post robotic-assisted laparoscopic hysterectomy with bilateral salpingoophorectomy, omentectomy, radical tumor debulking 12/29/2014. Per Dr. Serita Grit office note 01/14/2015 cytoreduction was optimal with residual disease remaining only in the 1 mm implants on the small intestine. 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  MSI-stable, mutation burden-4, BRAF rearrangement,ERBB4, KRAS G12D Cycle 1 adjuvant Taxol/carboplatin 01/20/2015 Cycle 2 adjuvant Taxol/carboplatin 02/10/2015 Cycle 3 adjuvant Taxol/carboplatin 03/10/2015 CA 125 on 1013 2016-42  CTs of the chest, abdomen, and pelvis 05/31/2015 with no evidence of progressive ovarian cancer  CTs of the chest, abdomen, and pelvis 08/07/2015 and 08/08/2015-no evidence of progressive ovarian cancer  Peritoneal studding noted at the time of the cholecystectomy procedure  08/09/2015 Elevated CA 125 10/07/2015  CT 10/07/2015 with stricturing at the sigmoid colon, constipation, and omental nodularity  Initiation of salvage weekly Taxol chemotherapy 10/13/2015  Taxol changed to every 2 weeks beginning 01/19/2016 due to painful neuropathy.  CT scans 07/19/2016 with no  acute findings. No features in the abdomen or pelvis to suggest recurrent disease.Stable mild fullness right intrarenal collecting system.  Elevated CA 125 treatment resumed with Taxol/Avastin on a 2 week schedule 09/27/2016  CT 02/12/2017-no evidence of carcinomatosis, no evidence of progressive metastatic disease  Taxol/Avastin continued every 2 weeks  CT 09/10/2017-increase in trace ascites, no evidence of progressive carcinomatosis, inflammatory changes at the lung bases with a new 3 mm right lower lobe nodule  Taxol/Avastin continued every 2 weeks  Progressive rise in the CA 125  Cycle 1 carboplatin 3-week schedule4/17/2019  Cycle 2 carboplatin 11/14/2017  Progressive malignant left pleural effusion June 2019  CT 01/07/2018-resolution of pulmonary nodule seen on prior examination. New small bilateral pleural effusions left greater than right with areas of atelectasis. Stable scattered mediastinal and hilar lymph nodes some partially calcified. Stable emphysematous changes and areas of pulmonary scarring. Small volume abdominal ascites but no omental or peritoneal surface disease. Small scattered mesenteric and retroperitoneal lymph nodes stable. Stable nodularity left adrenal gland.  Cycle 1 gemcitabine 01/09/2018 (day 1/day 8 schedule)  Cycle 2 gemcitabine 01/30/2018  Cycle 3 gemcitabine 02/21/2018  CT 03/03/2018-moderate free fluid in the abdomen and pelvis, thickened walls of small bowel loops in the right lower quadrant  Cycle 4 gemcitabine 03/13/2018  CT 03/03/2018-small bilateral pleural effusions, moderate ascites, small bowel wall thickening in the right lower  quadrant  Paracentesis 03/20/2018-negative cytology  Cycle5day 1 gemcitabine 04/03/2018  Cycle 6 gemcitabine 04/24/2018  Peritoneal fluid cytology 04/24/2018-positive for metastatic adenocarcinoma  Clinical disease progression with a risingCA125  Cycle 1 Doxil 05/16/2018 2. COPD-followed by Dr. Lamonte Sakai. 3. Dyspnea secondary to COPD and the large right pleural effusion, status post therapeutic thoracentesis procedures 09/30/2014,10/09/2014, and 10/21/2014. Left thoracentesis 10/30/2014  Progressive left pleural effusion noted on chest x-ray 12/17/2017  Left thoracentesis 12/19/2017, cytology positive for metastatic adenocarcinoma 4. Left nephrectomy as a child 5. Delayed nausea following cycle 1 Taxol/carboplatin, Aloxi/Emend added with cycle 2 with improvement. 6. Right lower extremity edema, right calf pain 12/09/2014. Negative venous Doppler 12/09/2014. 7. Diffuse pruritus following cycle 1 adjuvant Taxol/carboplatin 01/20/2015, no rash, resolved with a steroid dose pack 8. Thrombocytopenia second to chemotherapy, the carboplatin was dose reduced with cycle 2 adjuvant Taxol/carboplatin 02/10/2015 9. Chemotherapy-induced peripheral neuropathy-painful peripheral neuropathy involving the feet 01/19/2016.  10. Admission with acute cholecystitis 08/07/2015, status post a cholecystectomy 08/09/2015 11. Admission 10/08/2015 with abdominal pain/constipation, improved with bowel rest and laxatives 12. Mild right hydronephrosis noted on the CT 10/07/2015. Renal ultrasound 12/16/2015 with no hydronephrosis noted.No hydronephrosis on CT 07/19/2016. 13. Anemia secondary to chronic disease and chemotherapy, status post a red cell transfusion 12/05/2017 14. Admission 02/22/2018 with increased dyspnea 15. Admission 03/23/2018 with a COPD flare 16. Intractable nausea/vomiting November 2019  Lynn Ramirez is admitted with intractable nausea and vomiting.  The vomiting has generally occurred 30 minutes  to 1 hour after eating.  Imaging studies have not confirmed a bowel obstruction.  She could have gastric outlet obstruction or tumor involving the stomach.  I have a low clinical suspicion for CNS metastases.  I doubt the nausea is related to the Doxil chemotherapy given on 05/16/2018.  I appreciate the consult from gastroenterology.  Ms. Frankum wishes to proceed with diagnostic evaluations to see if there is any chance of relieving the nausea.  I asked GYN oncology to see her.   LOS: 1 day   Betsy Coder, MD   05/28/2018, 1:49 PM

## 2018-05-28 NOTE — Progress Notes (Signed)
Initial Nutrition Assessment  DOCUMENTATION CODES:   Obesity unspecified  INTERVENTION:   Diet advancement per MD Once diet advanced, provide Boost Breeze po TID, each supplement provides 250 kcal and 9 grams of protein  NUTRITION DIAGNOSIS:   Increased nutrient needs related to cancer and cancer related treatments as evidenced by estimated needs.  GOAL:   Patient will meet greater than or equal to 90% of their needs  MONITOR:   PO intake, Supplement acceptance, Labs, Weight trends, I & O's  REASON FOR ASSESSMENT:   Consult Assessment of nutrition requirement/status  ASSESSMENT:    77 y.o. 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  She is able to tolerate a little bit of liquids.  Attempted to see patient 11/19, pt was speaking with staff members for extended time. This morning RD attempted to speak with patient but pt is currently in endoscopy for EGD.  Pt currently NPO, once diet is advanced recommend Boost Breeze supplements. Per chart review, pt has had N/V in which she vomits ~30 minutes after consuming meals. Pt is s/p paracentesis for malignant ascites.  Per oncology note, MD is recommending venting G-tube placement. Recommending hospice.  Per weight records, pt has lost 5 lb since 10/16 (3% wt loss x 1 month, insignificant for time frame).   Labs reviewed. Medications: IV Reglan every 6 hours, Vitamin B-6 tablet daily, Vitamin B-12 tablet daily, Lactated Ringers infusion  NUTRITION - FOCUSED PHYSICAL EXAM:  Unable to perform, patient not in room.  Diet Order:   Diet Order            Diet NPO time specified  Diet effective now               EDUCATION NEEDS:   Not appropriate for education at this time  Skin:  Skin Assessment: Reviewed RN Assessment  Last BM:  11/19  Height:   Ht Readings from Last 1 Encounters:  05/27/2018 5\' 1"  (1.549 m)    Weight:   Wt Readings from Last 1 Encounters:  05/30/2018 76.8 kg    Ideal Body Weight:  47.7 kg  BMI:  Body mass index is 31.99 kg/m.  Estimated Nutritional Needs:   Kcal:  1700-1900  Protein:  75-85g  Fluid:  1.9L/day  Clayton Bibles, MS, RD, LDN [employer] Diet[facility] Pager: [phone] After Hours Pager: [phone]

## 2018-05-28 NOTE — H&P (View-Only) (Signed)
Referring Provider: [facility]    Primary Care Physician:  Midge Minium, MD Primary Gastroenterologist:  Previously Erskine Emery, MD     Reason for Consultation:  Nausea / vomiting     ASSESSMENT    1. 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   2. Progressive nausea and vomiting. Admitted out of concern for GOO or bowel obstruction but imaging negative for either. Zofran no longer controlling symptoms.  -She cannot correlate progressive N/V with any meds.  -She had 1.3 liters of ascitic fluid removed on Friday and that didn't help.  -Etiology of progressive N/V unclear. Patient motivated to get back and resume chemotherapy.  -I assume there is low risk of brain met? -Not unreasonable to perform an EGD though patient and daughter understand that it may not provide the answers.  -continue Zofran and reglan  3. GERD. Having breakthrough burning in throat. The vomiting has probably irritated her esophagus.  -advised her to stay at 45 degree in bed -continue daily PPI -renal function is okay, will add Carafate TID ac    Attending Physician's note   I have taken a history, examined the patient and reviewed the chart. I agree with the Advanced Practitioner's note, impression and recommendations.   Refractory nausea and vomiting in setting of metastatic gyn malignancy with carcinomatosis. No imaging evidence of SBO or GOO. R/O ulcer, esophagitis. Suspect carcinomatosis is negatively impacting small bowel and/or gastric motility. Continue PPI for GERD symptoms which could be due to recurrent vomiting. Optimize antiemetic regimen.   Lucio Edward, MD [facility] [phone]     PLAN:    HPI: Lynn Ramirez is a 77 y.o. female with COPD on home 26. 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 She has been struggling with intermittent nausea / vomiting for weeks. CT  scan for the  N/V on 11/12 was negative for bowel obstruction. The nausea / vomiting was able to be managed with Zofran until about a week ago when she began being unable to even hold down liquids. She was seen by Oncologist yesterday with N/V and decision was made to admit for further evaluation.   The nausea and vomiting is mainly within 30-45 minutes after any PO intake. If eats solids she will have undigested food in the emesis. She rarely takes narcotics. No new meds other than thyroid medication. She did start chemo recently but N/V doesn't really correlate with treatment. Her BMs have been relatively normal considering the decreased PO intake. No NSAID use. She had a paracentesis on Friday but it obviously didn't alleviate the N/V. She takes a daily PPI but having breakthrough burning in her throat recently  Data reviewed:  05/14/2018  KUB neg for bowel obstruction 05/21/18 non-contrast CTAP negative for obstruction   Past Medical History:  Diagnosis Date  . CKD (chronic kidney disease), stage II   . COPD (chronic obstructive pulmonary disease) ([place])   . Coronary artery calcification seen on CT scan   . Difficulty sleeping   . Eczema    hands  . Emphysema   . GERD (gastroesophageal reflux disease)   . H/O hydronephrosis   . History of transfusion    age 39  . Hyperlipidemia   . Hypertension   . Ovarian cancer ([place]) dx'd 09/2014   metastatic - prior malignant R pleural effusion and peritoneal carcinomatosis  . S/p nephrectomy     Past Surgical History:  Procedure  Laterality Date  . ABDOMINAL HYSTERECTOMY    . CHOLECYSTECTOMY N/A 08/09/2015   Procedure: Attempted LAPAROSCOPIC coverted  open CHOLECYSTECTOMY WITH INTRAOPERATIVE CHOLANGIOGRAM;  Surgeon: Autumn Messing III, MD;  Location: [facility];  Service: General;  Laterality: N/A;  . HAND SURGERY Right 1980's  . LAPAROTOMY N/A 12/29/2014   Procedure:  LAPAROTOMY;  Surgeon: Everitt Amber, MD;  Location: [facility];  Service: Gynecology;   Laterality: N/A;  . NEPHRECTOMY    . ROBOTIC ASSISTED TOTAL HYSTERECTOMY WITH BILATERAL SALPINGO OOPHERECTOMY Bilateral 12/29/2014   Procedure: ROBOTIC ASSISTED TOTAL LAPAROSCOPIC HYSTERECTOMY WITH BILATERAL SALPINGO OOPHORECTOMY AND OOMENTECTOMY WITH RADICAL TUMOR Melvina ;  Surgeon: Everitt Amber, MD;  Location: [facility];  Service: Gynecology;  Laterality: Bilateral;  . THORACENTESIS     several  . TONSILLECTOMY    . TUBAL LIGATION      Prior to Admission medications   Medication Sig Start Date End Date Taking? Authorizing Provider  acetaminophen (TYLENOL) 650 MG CR tablet Take 650 mg by mouth every 8 (eight) hours as needed for pain.   Yes [provider]  albuterol (PROAIR HFA) 108 (90 Base) MCG/ACT inhaler Inhale 2 puffs into the lungs 4 (four) times daily as needed for wheezing. 05/26/2018  Yes Collene Gobble, MD  albuterol (PROVENTIL) (2.5 MG/3ML) 0.083% nebulizer solution INHALE THE CONTENTS OF 1 VIAL VIA NEBULIZER EVERY 6 HOURS AS NEEDED FOR WHEEZING OR SHORTNESS OF BREATH Patient taking differently: Take 2.5 mg by nebulization every 6 (six) hours as needed for wheezing or shortness of breath.  02/25/18  Yes Collene Gobble, MD  antiseptic oral rinse (BIOTENE) LIQD 15 mLs by Mouth Rinse route 3 (three) times daily.   Yes [provider]  Biotin 2500 MCG CAPS Take 1 tablet by mouth daily.   Yes [provider]  carvedilol (COREG) 3.125 MG tablet TAKE 1 TABLET(3.125 MG) BY MOUTH TWICE DAILY WITH A MEAL Patient taking differently: Take 3.125 mg by mouth 2 (two) times daily with a meal.  11/27/17  Yes Dorothy Spark, MD  cholecalciferol (VITAMIN D) 1000 units tablet Take 1,000 Units by mouth daily.   Yes [provider]  esomeprazole (NEXIUM) 40 MG capsule Take 40 mg by mouth daily at 12 noon.   Yes [provider]  Fluticasone Furoate (ARNUITY ELLIPTA) 200 MCG/ACT AEPB Inhale 1 puff into the lungs daily. 04/01/18  Yes Collene Gobble, MD    furosemide (LASIX) 40 MG tablet Take 1 tablet (40 mg total) by mouth daily. 12/10/17  Yes Dorothy Spark, MD  gabapentin (NEURONTIN) 300 MG capsule TAKE 1 CAPSULE BY MOUTH EVERY NIGHT AT BEDTIME 03/07/18  Yes Ladell Pier, MD  levothyroxine (SYNTHROID, LEVOTHROID) 50 MCG tablet Take 1 tablet (50 mcg total) by mouth daily. 04/03/18  Yes Midge Minium, MD  lidocaine-prilocaine (EMLA) cream Apply to port site one hour prior to use. Do not rub in. Cover with plastic. 10/12/16  Yes Ladell Pier, MD  loratadine (CLARITIN) 10 MG tablet Take 10 mg by mouth daily.   Yes [provider]  LORazepam (ATIVAN) 0.5 MG tablet Place 1 tablet (0.5 mg total) under the tongue every 8 (eight) hours as needed (nausea). 05/24/18  Yes Ladell Pier, MD  ondansetron (ZOFRAN-ODT) 4 MG disintegrating tablet Take 1 tablet (4 mg total) by mouth every 8 (eight) hours as needed for nausea or vomiting. 12/05/17  Yes Ladell Pier, MD  Potassium Chloride ER 20 MEQ TBCR TAKE 1/2 TABLET BY MOUTH  DAILY Patient taking differently: Take 10 mg by mouth daily.  03/19/18  Yes Ladell Pier, MD  pyridoxine (B-6) 100 MG tablet Take 100 mg by mouth daily.   Yes [provider]  STIOLTO RESPIMAT 2.5-2.5 MCG/ACT AERS INHALE 2 PUFFS INTO THE LUNGS DAILY Patient taking differently: Take 2 puffs by mouth daily.  09/11/17  Yes Collene Gobble, MD  vitamin B-12 (CYANOCOBALAMIN) 100 MCG tablet Take 100 mcg by mouth daily. Reported on 01/19/2016   Yes [provider]  zolpidem (AMBIEN) 5 MG tablet Take 1 tablet (5 mg total) by mouth at bedtime as needed for sleep. 08/01/17  Yes [name], NP  OXYGEN Inhale 2 L into the lungs.    [provider]  prochlorperazine (COMPAZINE) 10 MG tablet Take 1 tablet (10 mg total) by mouth every 6 (six) hours as needed for nausea or vomiting. Patient not taking: Reported on 05/11/2018 05/23/18   Harle Stanford., PA-C    Current Facility-Administered  Medications  Medication Dose Route Frequency Provider Last Rate Last Dose  . 0.9 %  sodium chloride infusion   Intravenous Continuous Annita Brod, MD 75 mL/hr at 05/28/18 0500    . acetaminophen (TYLENOL) tablet 650 mg  650 mg Oral Q6H PRN Annita Brod, MD       Or  . acetaminophen (TYLENOL) suppository 650 mg  650 mg Rectal Q6H PRN Annita Brod, MD      . albuterol (PROVENTIL) (2.5 MG/3ML) 0.083% nebulizer solution 2.5 mg  2.5 mg Nebulization Q6H PRN Annita Brod, MD      . gabapentin (NEURONTIN) capsule 300 mg  300 mg Oral QHS Annita Brod, MD   300 mg at 05/23/2018 2050  . ipratropium-albuterol (DUONEB) 0.5-2.5 (3) MG/3ML nebulizer solution 3 mL  3 mL Nebulization Q6H Adhikari, Amrit, MD      . levothyroxine (SYNTHROID, LEVOTHROID) tablet 50 mcg  50 mcg Oral Q0600 Annita Brod, MD   50 mcg at 05/28/18 [phone]  . LORazepam (ATIVAN) tablet 0.5 mg  0.5 mg Sublingual Q8H PRN Annita Brod, MD   0.5 mg at 05/28/18 0958  . metoCLOPramide (REGLAN) injection 5 mg  5 mg Intravenous Q6H Annita Brod, MD      . ondansetron [facility]) tablet 4 mg  4 mg Oral Q6H PRN Annita Brod, MD       Or  . ondansetron [facility]) injection 4 mg  4 mg Intravenous Q6H PRN Annita Brod, MD   4 mg at 05/28/18 0705  . pantoprazole (PROTONIX) EC tablet 40 mg  40 mg Oral Daily Annita Brod, MD   40 mg at 05/28/18 1100  . polyethylene glycol (MIRALAX / GLYCOLAX) packet 17 g  17 g Oral Daily PRN Annita Brod, MD      . pyridOXINE (VITAMIN B-6) tablet 100 mg  100 mg Oral Daily Annita Brod, MD      . vitamin B-12 (CYANOCOBALAMIN) tablet 100 mcg  100 mcg Oral Daily Annita Brod, MD      . zolpidem (AMBIEN) tablet 5 mg  5 mg Oral QHS PRN Annita Brod, MD        Allergies as of 06/04/2018 - Review Complete 05/26/2018  Allergen Reaction Noted  . Latex Rash 11/02/2014  . Penicillins Other (See Comments) 03/06/2012    Family History  Problem  Relation Age of Onset  . Diabetes Father   . Lung cancer Father 73  metastasis to liver  . Cancer Father        liver  . Pancreatic cancer Mother 17  . Stroke Paternal Grandfather   . Heart disease Maternal Aunt   . Diabetes Paternal Uncle   . Heart Problems Maternal Grandmother   . Heart Problems Maternal Grandfather   . Diabetes Paternal Grandmother   . Stroke Paternal Grandmother   . Heart Problems Paternal Uncle   . Cancer Cousin        unknown type  . Breast cancer Cousin        dx. 71s  . Leukemia Cousin        dx. 16-17  . Colon cancer Neg Hx   . Stomach cancer Neg Hx     Social History   Socioeconomic History  . Marital status: Married    Spouse name: Not on file  . Number of children: 3  . Years of education: Not on file  . Highest education level: Not on file  Occupational History  . Occupation: RETIRED    Employer: RETIRED  Social Needs  . Financial resource strain: Not hard at all  . Food insecurity:    Worry: Never true    Inability: Never true  . Transportation needs:    Medical: No    Non-medical: No  Tobacco Use  . Smoking status: Former Smoker    Packs/day: 1.00    Years: 40.00    Pack years: 40.00    Types: Cigarettes    Last attempt to quit: 07/10/2013    Years since quitting: 4.8  . Smokeless tobacco: Never Used  Substance and Sexual Activity  . Alcohol use: Yes    Alcohol/week: 0.0 standard drinks    Comment: 1-2 a week  . Drug use: No  . Sexual activity: Never  Lifestyle  . Physical activity:    Days per week: 0 days    Minutes per session: 0 min  . Stress: Only a little  Relationships  . Social connections:    Talks on phone: More than three times a week    Gets together: More than three times a week    Attends religious service: More than 4 times per year    Active member of club or organization: Yes    Attends meetings of clubs or organizations: More than 4 times per year    Relationship status: Married  . Intimate  partner violence:    Fear of current or ex partner: Not on file    Emotionally abused: Not on file    Physically abused: Not on file    Forced sexual activity: Not on file  Other Topics Concern  . Not on file  Social History Narrative  . Not on file    Review of Systems: All systems reviewed and negative except where noted in HPI.  Physical Exam: Vital signs in last 24 hours: Temp:  [97.5 F (36.4 C)-98.5 F (36.9 C)] 97.6 F (36.4 C) (11/19 0459) Pulse Rate:  [88-95] 89 (11/19 0459) Resp:  [17-19] 19 (11/19 0459) BP: (124-136)/(60-72) 128/60 (11/19 0459) SpO2:  [96 %-100 %] 97 % (11/19 0459) Weight:  [76.8 kg] 76.8 kg (11/18 1454) Last BM Date: 05/28/18 General:   Alert, well-developed, female in NAD Psych:  Pleasant, cooperative. Normal mood and affect. Eyes:  Pupils equal, sclera clear, no icterus.   Conjunctiva pink. Ears:  Normal auditory acuity. Nose:  No deformity, discharge,  or lesions. Neck:  Supple; no masses Lungs:  Clear throughout  to auscultation.  Heart:  Regular rate and rhythm; no murmurs, no lower extremity edema Abdomen:  Soft except in mid abdomen above umbilicus. That area is slightly firm and tender. BS active, no palp mass    Rectal:  Deferred  Msk:  Symmetrical without gross deformities. . Neurologic:  Alert and  oriented x4;  grossly normal neurologically. Skin:  Intact without significant lesions or rashes.   Intake/Output from previous day: 11/18 0701 - 11/19 0700 In: 747.2 [I.V.:747.2] Out: 75 [Urine:75] Intake/Output this shift: No intake/output data recorded.  Lab Results: Recent Labs    06/05/2018 1821 05/28/18 0500  WBC 4.2 3.3*  HGB 8.7* 8.0*  HCT 30.7* 27.7*  PLT 209 190   BMET Recent Labs    05/22/2018 1821 05/28/18 0500  NA 138 141  K 3.4* 3.6  CL 94* 98  CO2 37* 37*  GLUCOSE 93 89  BUN 20 16  CREATININE 0.89 0.75  CALCIUM 8.7* 8.4*   LFT Recent Labs    06/06/2018 1821  PROT 6.3*  ALBUMIN 2.6*  AST 12*  ALT 9   ALKPHOS 49  BILITOT 0.9     Studies/Results: Dg Abd Portable 1v  Result Date: 05/10/2018 CLINICAL DATA:  Follow-up small bowel obstruction EXAM: PORTABLE ABDOMEN - 1 VIEW COMPARISON:  May 24, 2018 FINDINGS: There is a small left pleural effusion with underlying opacity. No evidence of bowel obstruction identified. Previous cholecystectomy. No free air, portal venous gas, or pneumatosis seen on the supine view. IMPRESSION: Small effusion and underlying opacity in the left lower chest. No acute abnormalities seen within the abdomen. No obstruction noted. Electronically Signed   By: Dorise Bullion III M.D   On: 05/24/2018 21:11     Tye Savoy, NP-C @  05/28/2018, 12:24 PM

## 2018-05-29 ENCOUNTER — Encounter (HOSPITAL_COMMUNITY): Payer: Self-pay | Admitting: *Deleted

## 2018-05-29 ENCOUNTER — Inpatient Hospital Stay (HOSPITAL_COMMUNITY): Payer: Medicare Other | Admitting: Certified Registered Nurse Anesthetist

## 2018-05-29 ENCOUNTER — Encounter (HOSPITAL_COMMUNITY): Admission: AD | Disposition: E | Payer: Self-pay | Source: Ambulatory Visit | Attending: Internal Medicine

## 2018-05-29 DIAGNOSIS — R1310 Dysphagia, unspecified: Secondary | ICD-10-CM

## 2018-05-29 HISTORY — PX: BIOPSY: SHX5522

## 2018-05-29 HISTORY — PX: ESOPHAGOGASTRODUODENOSCOPY: SHX5428

## 2018-05-29 SURGERY — EGD (ESOPHAGOGASTRODUODENOSCOPY)
Anesthesia: General

## 2018-05-29 MED ORDER — BOOST / RESOURCE BREEZE PO LIQD CUSTOM
1.0000 | Freq: Three times a day (TID) | ORAL | Status: DC
  Administered 2018-05-29 – 2018-05-30 (×2): 1 via ORAL

## 2018-05-29 MED ORDER — IPRATROPIUM-ALBUTEROL 0.5-2.5 (3) MG/3ML IN SOLN
3.0000 mL | Freq: Two times a day (BID) | RESPIRATORY_TRACT | Status: DC
  Administered 2018-05-30 – 2018-06-01 (×7): 3 mL via RESPIRATORY_TRACT
  Filled 2018-05-29 (×7): qty 3

## 2018-05-29 MED ORDER — PROPOFOL 10 MG/ML IV BOLUS
INTRAVENOUS | Status: AC
  Filled 2018-05-29: qty 60

## 2018-05-29 MED ORDER — PROPOFOL 10 MG/ML IV BOLUS
INTRAVENOUS | Status: DC | PRN
  Administered 2018-05-29: 100 mg via INTRAVENOUS

## 2018-05-29 MED ORDER — SUCCINYLCHOLINE CHLORIDE 200 MG/10ML IV SOSY
PREFILLED_SYRINGE | INTRAVENOUS | Status: DC | PRN
  Administered 2018-05-29: 100 mg via INTRAVENOUS

## 2018-05-29 MED ORDER — LACTATED RINGERS IV SOLN
INTRAVENOUS | Status: DC
  Administered 2018-05-29: 1000 mL via INTRAVENOUS

## 2018-05-29 MED ORDER — MAGIC MOUTHWASH W/LIDOCAINE
5.0000 mL | Freq: Three times a day (TID) | ORAL | Status: DC
  Administered 2018-05-29 – 2018-06-01 (×11): 5 mL via ORAL
  Filled 2018-05-29 (×16): qty 5

## 2018-05-29 MED ORDER — PROPOFOL 500 MG/50ML IV EMUL
INTRAVENOUS | Status: DC | PRN
  Administered 2018-05-29: 150 ug/kg/min via INTRAVENOUS

## 2018-05-29 MED ORDER — LIDOCAINE 2% (20 MG/ML) 5 ML SYRINGE
INTRAMUSCULAR | Status: DC | PRN
  Administered 2018-05-29: 80 mg via INTRAVENOUS

## 2018-05-29 MED ORDER — PANTOPRAZOLE SODIUM 40 MG IV SOLR
40.0000 mg | Freq: Two times a day (BID) | INTRAVENOUS | Status: DC
  Administered 2018-05-29 – 2018-06-01 (×8): 40 mg via INTRAVENOUS
  Filled 2018-05-29 (×8): qty 40

## 2018-05-29 MED ORDER — FENTANYL CITRATE (PF) 100 MCG/2ML IJ SOLN
INTRAMUSCULAR | Status: AC
  Filled 2018-05-29: qty 2

## 2018-05-29 MED ORDER — ONDANSETRON HCL 4 MG/2ML IJ SOLN
INTRAMUSCULAR | Status: DC | PRN
  Administered 2018-05-29: 4 mg via INTRAVENOUS

## 2018-05-29 MED ORDER — FENTANYL CITRATE (PF) 100 MCG/2ML IJ SOLN
INTRAMUSCULAR | Status: DC | PRN
  Administered 2018-05-29: 50 ug via INTRAVENOUS

## 2018-05-29 MED ORDER — FLUCONAZOLE 100MG IVPB
100.0000 mg | INTRAVENOUS | Status: DC
  Administered 2018-05-29 – 2018-05-30 (×2): 100 mg via INTRAVENOUS
  Filled 2018-05-29 (×3): qty 50

## 2018-05-29 NOTE — Anesthesia Postprocedure Evaluation (Addendum)
Anesthesia Post Note  Patient: Lynn Ramirez  Procedure(s) Performed: ESOPHAGOGASTRODUODENOSCOPY (EGD) (N/A ) BIOPSY     Patient location during evaluation: PACU Anesthesia Type: General Level of consciousness: sedated and patient cooperative Pain management: pain level controlled Vital Signs Assessment: post-procedure vital signs reviewed and stable Respiratory status: spontaneous breathing Cardiovascular status: stable Anesthetic complications: no    Last Vitals:  Vitals:   05/26/2018 1120 05/20/2018 1130  BP: (!) 146/55 (!) 153/61  Pulse: (!) 113 (!) 113  Resp: (!) 28 (!) 30  Temp:    SpO2: 100% 97%    Last Pain:  Vitals:   06/08/2018 1130  TempSrc:   PainSc: 0-No pain                 Nolon Nations

## 2018-05-29 NOTE — Anesthesia Procedure Notes (Signed)
Procedure Name: Intubation Date/Time: 05/13/2018 10:44 AM Performed by: Montel Clock, CRNA Pre-anesthesia Checklist: Patient identified, Emergency Drugs available, Suction available, Patient being monitored and Timeout performed Patient Re-evaluated:Patient Re-evaluated prior to induction Oxygen Delivery Method: Circle system utilized Preoxygenation: Pre-oxygenation with 100% oxygen Induction Type: IV induction Ventilation: Mask ventilation without difficulty Laryngoscope Size: Mac and 3 Grade View: Grade I Tube type: Oral Tube size: 7.0 mm Number of attempts: 1 Airway Equipment and Method: Stylet Placement Confirmation: ETT inserted through vocal cords under direct vision,  positive ETCO2 and breath sounds checked- equal and bilateral Secured at: 21 cm Tube secured with: Tape Dental Injury: Teeth and Oropharynx as per pre-operative assessment

## 2018-05-29 NOTE — Progress Notes (Signed)
PROGRESS NOTE    Lynn Ramirez  NAT:[id number] DOB: [birth year] DOA: 06/03/2018 PCP: Midge Minium, MD   Brief Narrative: Patient is a 77 year old female with past medical history of COPD on 2 L oxygen at home, peritoneal carcinomatosis/fallopian tube malignancy with malignant ascites, who presented with complaints of nausea and vomiting from home.  There was concern for gastric outlet obstruction after she presented to her oncologist office.  Admitted for further evaluation.  GI consulted.Underwent EGD today.   Assessment & Plan:   Principal Problem:   Nausea & vomiting Active Problems:   Metastatic adenocarcinoma (HCC)   Primary peritoneal carcinomatosis (HCC)   Malignant ascites   Malignant neoplasm of fallopian tube (HCC)   CAD (coronary artery disease)   Chronic respiratory failure with hypoxia (HCC)   COPD GOLD III D   Obesity (BMI 30-39.9)   Dysphagia  Nausea/vomiting: Concern for gastric outlet obstruction due to her metastatic disease.  GI consulted for possible endoscopy/stent for GI outlet obstruction.Underwent EGD today which  showed severe esophagitis, normal bulb and second portion of duodenum. Currently on clear liquid diet.    Severe esophagitis: As per the EGD.  Start on IV fluconazole.  Biopsies taken.  Also started on Protonix.  Metastatic adenocarcinoma: malignant  neoplasm of the fallopian tube, malignant ascites, peritoneal carcinomatosis.  She follows with Dr. Learta Codding.  Was on chemotherapy. Status post paracentesis for malignant ascites on 11/15.  Chronic respiratory failure/COPD: On home oxygen at 2 L.  Continue bronchodilators.  Wheezes has improved today.  Anemia: Secondary to chronic disease .Continue to monitor CBC  Coronary Artery disease: Stable  DVT prophylaxis: SCD Code Status: Partial code Family Communication: Family members present at the bedside Disposition Plan: Home likely tomorrow   Consultants: GI, oncology  Procedures:  None  Antimicrobials: None  Subjective: Patient seen and examined the bedside this morning.  Nausea and vomiting   improved.  Respiratory status improved. She was waiting for EGD this morning. Hemodynamically stable.  Objective: Vitals:   05/27/2018 0956 05/30/2018 1113 05/24/2018 1120 05/11/2018 1130  BP: (!) 147/57 134/74 (!) 146/55 (!) 153/61  Pulse: (!) 105 (!) 116 (!) 113 (!) 113  Resp: 20 16 (!) 28 (!) 30  Temp: 98.3 F (36.8 C) 99.1 F (37.3 C)    TempSrc: Oral Oral    SpO2: 96% 100% 100% 97%  Weight: 76.8 kg     Height: 5\' 1"  (1.549 m)       Intake/Output Summary (Last 24 hours) at 05/20/2018 1259 Last data filed at 05/11/2018 1106 Gross per 24 hour  Intake 400 ml  Output -  Net 400 ml   Filed Weights   05/15/2018 0956  Weight: 76.8 kg    Examination:  General exam: Not in distress,average built, chronically ill elderly female HEENT:PERRL,Oral mucosa moist, Ear/Nose normal on gross exam Respiratory system: Bilateral decreased entry,no wheezes heard today Cardiovascular system: S1 & S2 heard, RRR. No JVD, murmurs, rubs, gallops or clicks. No pedal edema.  Right-sided Port-A-Cath Gastrointestinal system: Abdomen is nondistended, soft and nontender. No organomegaly or masses felt. Normal bowel sounds heard. Central nervous system: Alert and oriented. No focal neurological deficits. Extremities: No edema, no clubbing ,no cyanosis, distal peripheral pulses palpable. Skin: No rashes, lesions or ulcers,no icterus ,no pallor MSK: Normal muscle bulk,tone ,power Psychiatry: Judgement and insight appear normal. Mood & affect appropriate.     Data Reviewed: I have personally reviewed following labs and imaging studies  CBC: Recent Labs  Lab 06/03/2018 1821 05/28/18  0500  WBC 4.2 3.3*  NEUTROABS 3.4  --   HGB 8.7* 8.0*  HCT 30.7* 27.7*  MCV 103.7* 103.0*  PLT 209 672   Basic Metabolic Panel: Recent Labs  Lab 05/20/2018 1821 05/28/18 0500  NA 138 141  K 3.4* 3.6  CL  94* 98  CO2 37* 37*  GLUCOSE 93 89  BUN 20 16  CREATININE 0.89 0.75  CALCIUM 8.7* 8.4*   GFR: Estimated Creatinine Clearance: 57 mL/min (by C-G formula based on SCr of 0.75 mg/dL). Liver Function Tests: Recent Labs  Lab 05/11/2018 1821  AST 12*  ALT 9  ALKPHOS 49  BILITOT 0.9  PROT 6.3*  ALBUMIN 2.6*   No results for input(s): LIPASE, AMYLASE in the last 168 hours. No results for input(s): AMMONIA in the last 168 hours. Coagulation Profile: No results for input(s): INR, PROTIME in the last 168 hours. Cardiac Enzymes: No results for input(s): CKTOTAL, CKMB, CKMBINDEX, TROPONINI in the last 168 hours. BNP (last 3 results) Recent Labs    12/17/17 1643  PROBNP 24.0   HbA1C: No results for input(s): HGBA1C in the last 72 hours. CBG: No results for input(s): GLUCAP in the last 168 hours. Lipid Profile: No results for input(s): CHOL, HDL, LDLCALC, TRIG, CHOLHDL, LDLDIRECT in the last 72 hours. Thyroid Function Tests: No results for input(s): TSH, T4TOTAL, FREET4, T3FREE, THYROIDAB in the last 72 hours. Anemia Panel: No results for input(s): VITAMINB12, FOLATE, FERRITIN, TIBC, IRON, RETICCTPCT in the last 72 hours. Sepsis Labs: No results for input(s): PROCALCITON, LATICACIDVEN in the last 168 hours.  No results found for this or any previous visit (from the past 240 hour(s)).       Radiology Studies: Dg Abd Portable 1v  Result Date: 05/16/2018 CLINICAL DATA:  Follow-up small bowel obstruction EXAM: PORTABLE ABDOMEN - 1 VIEW COMPARISON:  May 24, 2018 FINDINGS: There is a small left pleural effusion with underlying opacity. No evidence of bowel obstruction identified. Previous cholecystectomy. No free air, portal venous gas, or pneumatosis seen on the supine view. IMPRESSION: Small effusion and underlying opacity in the left lower chest. No acute abnormalities seen within the abdomen. No obstruction noted. Electronically Signed   By: Dorise Bullion III M.D   On:  05/19/2018 21:11        Scheduled Meds: . feeding supplement  1 Container Oral TID BM  . gabapentin  300 mg Oral QHS  . ipratropium-albuterol  3 mL Nebulization TID  . levothyroxine  50 mcg Oral Q0600  . magic mouthwash w/lidocaine  5 mL Oral TID AC & HS  . metoCLOPramide (REGLAN) injection  5 mg Intravenous Q6H  . mometasone-formoterol  2 puff Inhalation BID  . pantoprazole (PROTONIX) IV  40 mg Intravenous Q12H  . pyridoxine  100 mg Oral Daily  . sucralfate  1 g Oral TID AC  . vitamin B-12  100 mcg Oral Daily   Continuous Infusions: . sodium chloride 75 mL/hr at 05/12/2018 1228  . fluconazole (DIFLUCAN) IV 100 mg (05/10/2018 1233)     LOS: 2 days    Time spent: 35 mins.More than 50% of that time was spent in counseling and/or coordination of care.      Shelly Coss, MD [facility] Pager [phone]  If 7PM-7AM, please contact night-coverage [email or web address] Password [facility] 05/20/2018, 12:59 PM

## 2018-05-29 NOTE — Transfer of Care (Signed)
Immediate Anesthesia Transfer of Care Note  Patient: Lynn Ramirez  Procedure(s) Performed: ESOPHAGOGASTRODUODENOSCOPY (EGD) (N/A ) BIOPSY  Patient Location: Endoscopy Unit  Anesthesia Type:General  Level of Consciousness: drowsy and patient cooperative  Airway & Oxygen Therapy: Patient Spontanous Breathing and Patient connected to face mask oxygen  Post-op Assessment: Report given to RN and Post -op Vital signs reviewed and stable  Post vital signs: Reviewed and stable  Last Vitals:  Vitals Value Taken Time  BP 142/122 05/21/2018 11:08 AM  Temp    Pulse 115 06/05/2018 11:10 AM  Resp 24 06/01/2018 11:10 AM  SpO2 100 % 05/14/2018 11:10 AM  Vitals shown include unvalidated device data.  Last Pain:  Vitals:   05/26/2018 0956  TempSrc: Oral  PainSc: 0-No pain         Complications: No apparent anesthesia complications

## 2018-05-29 NOTE — Op Note (Addendum)
[facility] Patient Name: Lynn Ramirez Procedure Date: 06/01/2018 MRN: [id number] Attending MD: Ladene Artist , MD Date of Birth: Oct 28, 1940 CSN: [id number] Age: 77 Admit Type: Inpatient Procedure:                Upper GI endoscopy Indications:              Dysphagia, Nausea with vomiting Providers:                Pricilla Riffle. Fuller Plan, MD, Carlyn Reichert, RN, Cherylynn Ridges, Technician, Cathe Mons, CRNA Referring MD:             [facility] Medicines:                General Anesthesia Complications:            No immediate complications. Estimated Blood Loss:     Estimated blood loss was minimal. Procedure:                Pre-Anesthesia Assessment:                           - Prior to the procedure, a History and Physical                            was performed, and patient medications and                            allergies were reviewed. The patient's tolerance of                            previous anesthesia was also reviewed. The risks                            and benefits of the procedure and the sedation                            options and risks were discussed with the patient.                            All questions were answered, and informed consent                            was obtained. Prior Anticoagulants: The patient has                            taken no previous anticoagulant or antiplatelet                            agents. ASA Grade Assessment: III - A patient with                            severe systemic disease. After reviewing the risks  and benefits, the patient was deemed in                            satisfactory condition to undergo the procedure.                           After obtaining informed consent, the endoscope was                            passed under direct vision. Throughout the                            procedure, the patient's blood pressure, pulse,  and                            oxygen saturations were monitored continuously. The                            GIF-H190 ([id number]) Olympus adult endoscope was                            introduced through the mouth, and advanced to the                            second part of duodenum. The upper GI endoscopy was                            accomplished without difficulty. The patient                            tolerated the procedure well. Scope In: Scope Out: Findings:      Severe esophagitis with no bleeding was found in the mid and distal       esophagus. R/O candida, viral, reflux. Biopsies were taken with a cold       forceps for histology.      The exam of the esophagus was otherwise normal.      A small hiatal hernia was present.      Diffuse mildly erythematous mucosa without bleeding was found in the       entire examined stomach.      The exam of the stomach was otherwise normal.      The duodenal bulb and second portion of the duodenum were normal. Impression:               - Severe reflux and candidiasis esophagitis.                            Biopsied.                           - Small hiatal hernia.                           - Erythematous mucosa in the stomach.                           -  Normal duodenal bulb and second portion of the                            duodenum. Moderate Sedation:      Not Applicable - Patient had care per Anesthesia. Recommendation:           - Patient has a contact number available for                            emergencies. The signs and symptoms of potential                            delayed complications were discussed with the                            patient. Return to normal activities tomorrow.                            Written discharge instructions were provided to the                            patient.                           - Clear liquid diet today.                           - Continue present medications.                            - Await pathology results.                           - Protonix (pantoprazole) 40 mg IV bid.                           - Diflucan (fluconazole) 100 mg IV/PO daily for 10                            days, discontinue if biopsies do not confirm. Procedure Code(s):        --- Professional ---                           [id number], Esophagogastroduodenoscopy, flexible,                            transoral; with biopsy, single or multiple Diagnosis Code(s):        --- Professional ---                           K21.0, Gastro-esophageal reflux disease with                            esophagitis                           B37.81, Candidal esophagitis  K44.9, Diaphragmatic hernia without obstruction or                            gangrene                           K31.89, Other diseases of stomach and duodenum                           R13.10, Dysphagia, unspecified                           R11.2, Nausea with vomiting, unspecified CPT copyright 2018 American Medical Association. All rights reserved. The codes documented in this report are preliminary and upon coder review may  be revised to meet current compliance requirements. Ladene Artist, MD 05/25/2018 11:04:14 AM This report has been signed electronically. Number of Addenda: 0

## 2018-05-29 NOTE — Anesthesia Preprocedure Evaluation (Addendum)
Anesthesia Evaluation  Patient identified by MRN, date of birth, ID band Patient awake    Reviewed: Allergy & Precautions, NPO status , Patient's Chart, lab work & pertinent test results  History of Anesthesia Complications Negative for: history of anesthetic complications  Airway Mallampati: II  TM Distance: >3 FB Neck ROM: Full    Dental no notable dental hx. (+) Dental Advisory Given   Pulmonary COPD,  COPD inhaler and oxygen dependent, former smoker,    Pulmonary exam normal breath sounds clear to auscultation       Cardiovascular hypertension, Pt. on medications + CAD and +CHF  Normal cardiovascular exam Rhythm:Regular Rate:Normal     Neuro/Psych  Neuromuscular disease negative psych ROS   GI/Hepatic Neg liver ROS, GERD  ,  Endo/Other  negative endocrine ROS  Renal/GU Renal disease   Ovarian Ca     Musculoskeletal negative musculoskeletal ROS (+)   Abdominal   Peds  Hematology  (+) Blood dyscrasia, anemia ,   Anesthesia Other Findings   Reproductive/Obstetrics negative OB ROS                             Anesthesia Physical  Anesthesia Plan  ASA: IV  Anesthesia Plan: General   Post-op Pain Management:    Induction: Intravenous, Rapid sequence and Cricoid pressure planned  PONV Risk Score and Plan: 3 and Ondansetron, Dexamethasone and Treatment may vary due to age or medical condition  Airway Management Planned: Oral ETT  Additional Equipment:   Intra-op Plan:   Post-operative Plan: Extubation in OR  Informed Consent: I have reviewed the patients History and Physical, chart, labs and discussed the procedure including the risks, benefits and alternatives for the proposed anesthesia with the patient or authorized representative who has indicated his/her understanding and acceptance.   Dental advisory given  Plan Discussed with: CRNA  Anesthesia Plan Comments:  (Code rescinded for periprocedural period. )     Anesthesia Quick Evaluation

## 2018-05-29 NOTE — Interval H&P Note (Signed)
History and Physical Interval Note:  05/28/2018 10:37 AM  Lynn Ramirez  has presented today for surgery, with the diagnosis of nausea and vomiting  The various methods of treatment have been discussed with the patient and family. After consideration of risks, benefits and other options for treatment, the patient has consented to  Procedure(s): ESOPHAGOGASTRODUODENOSCOPY (EGD) (N/A) as a surgical intervention .  The patient's history has been reviewed, patient examined, no change in status, stable for surgery.  I have reviewed the patient's chart and labs.  Questions were answered to the patient's satisfaction.     Pricilla Riffle. Fuller Plan

## 2018-05-30 ENCOUNTER — Encounter: Payer: Self-pay | Admitting: General Practice

## 2018-05-30 MED ORDER — PROMETHAZINE HCL 25 MG/ML IJ SOLN
12.5000 mg | Freq: Four times a day (QID) | INTRAMUSCULAR | Status: DC | PRN
  Administered 2018-05-30: 12.5 mg via INTRAVENOUS
  Filled 2018-05-30 (×2): qty 1

## 2018-05-30 NOTE — Progress Notes (Addendum)
     [facility] Progress Note   Chief Complaint:   Nausea / vomiting   SUBJECTIVE:   Taking very little PO today, throat still burning. Appetite is poor. Not nauseated.    ASSESSMENT AND PLAN:   1. 77 yo female with metastatic GYN cancer with malignant pleural effusion and malignant ascites.   2. Nausea / vomiting / burning in throat and chest. No GOO on imaging. She had severe reflux and candida esophagitis on EGD yesterday. Biopsies pending.    -continue Diflucan -continue magic mouthwash. There was some question about whether this was a swish and swallow only. Will make sure Nursing knows to have patient swallow.  -continue high dose PPI.  -Continue carafate -Continue to keep HOB elevated.     Attending Physician Note   I have taken an interval history, reviewed the chart and examined the patient. I agree with the Advanced Practitioner's note, impression and recommendations. Severe esophagitis treating for candida and reflux. Awaiting pathology. There was no evidence of GOO. She could have an underlying GI motility impairment due to carcinomatosis.   Lucio Edward, MD FACG [phone]    OBJECTIVE:     Vital signs in last 24 hours: Temp:  [99.1 F (37.3 C)-99.7 F (37.6 C)] 99.1 F (37.3 C) (11/21 0550) Pulse Rate:  [112-121] 121 (11/21 0550) Resp:  [20-32] 32 (11/21 0550) BP: (148-155)/(69-76) 148/76 (11/21 0550) SpO2:  [92 %-98 %] 96 % (11/21 0835) Last BM Date: 05/28/18 General:   Alert, well-developed female in NAD EENT:  Normal hearing, non icteric sclera, conjunctive pink.  Heart:  Regular rate and rhythm, 2+ LLE edema  Pulm: Normal respiratory effort, lungs CTA bilaterally without wheezes or crackles. Abdomen:  Soft,  Normal bowel sounds, Neurologic:  Alert and  oriented x4;  grossly normal neurologically. Psych:  Pleasant, cooperative.  Normal mood and affect.   Intake/Output from previous day: 11/20 0701 - 11/21 0700 In: 3973.7  [P.O.:200; I.V.:3723.7; IV Piggyback:50] Out: -  Intake/Output this shift: No intake/output data recorded.  Lab Results: Recent Labs    06/06/2018 1821 05/28/18 0500  WBC 4.2 3.3*  HGB 8.7* 8.0*  HCT 30.7* 27.7*  PLT 209 190   BMET Recent Labs    05/31/2018 1821 05/28/18 0500  NA 138 141  K 3.4* 3.6  CL 94* 98  CO2 37* 37*  GLUCOSE 93 89  BUN 20 16  CREATININE 0.89 0.75  CALCIUM 8.7* 8.4*   LFT Recent Labs    06/06/2018 1821  PROT 6.3*  ALBUMIN 2.6*  AST 12*  ALT 9  ALKPHOS 2  BILITOT 0.9    Principal Problem:   Nausea & vomiting Active Problems:   Metastatic adenocarcinoma (HCC)   Primary peritoneal carcinomatosis (HCC)   Malignant ascites   Malignant neoplasm of fallopian tube (HCC)   CAD (coronary artery disease)   Chronic respiratory failure with hypoxia (HCC)   COPD GOLD III D   Obesity (BMI 30-39.9)   Dysphagia     LOS: 3 days   Tye Savoy ,NP 05/30/2018, 3:15 PM

## 2018-05-30 NOTE — Care Management Important Message (Signed)
Important Message  Patient Details  Name: Lynn Ramirez MRN: [id number] Date of Birth: 01/21/1941   Medicare Important Message Given:  Yes    Kerin Salen 05/30/2018, 10:24 AMImportant Message  Patient Details  Name: Lynn Ramirez MRN: [id number] Date of Birth: 04/27/1941   Medicare Important Message Given:  Yes    Kerin Salen 05/30/2018, 10:24 AM

## 2018-05-30 NOTE — Progress Notes (Signed)
Fera and daughter were requesting that the IV Protonix 40 mg be switched to Nexium 40 mg PO. Provider Tawanna Solo was paged and suggested that the patient continue to take Protonix due to her report of nausea. Will update Levings and her daughter.  Birdie Hopes, RN

## 2018-05-30 NOTE — Progress Notes (Signed)
PROGRESS NOTE    Lynn Ramirez  WUJ:[id number] DOB: 29-Jun-1941 DOA: 05/10/2018 PCP: Midge Minium, MD   Brief Narrative: Patient is a 77 year old female with past medical history of COPD on 2 L oxygen at home, peritoneal carcinomatosis/fallopian tube malignancy with malignant ascites, who presented with complaints of nausea and vomiting from home.  There was concern for gastric outlet obstruction after she presented to her oncologist office.  Admitted for further evaluation.  GI consulted.Underwent EGD with  finding of severe esophagitis.   Assessment & Plan:   Principal Problem:   Nausea & vomiting Active Problems:   Metastatic adenocarcinoma (HCC)   Primary peritoneal carcinomatosis (HCC)   Malignant ascites   Malignant neoplasm of fallopian tube (HCC)   CAD (coronary artery disease)   Chronic respiratory failure with hypoxia (HCC)   COPD GOLD III D   Obesity (BMI 30-39.9)   Dysphagia  Nausea/vomiting: There was concern for gastric outlet obstruction due to her metastatic disease.  GI consulted for possible endoscopy/stent for GI outlet obstruction.Underwent EGD on 05/15/2018 which  showed severe esophagitis, normal bulb and second portion of duodenum. Currently on clear liquid diet.  Will advance as tolerated . But patient still has persistent  nausea..  Severe esophagitis: As per the EGD.  Start on IV fluconazole.  Biopsies taken.  Also started on Protonix.  Metastatic adenocarcinoma: malignant  neoplasm of the fallopian tube, malignant ascites, peritoneal carcinomatosis.  She follows with Dr. Learta Codding.  Was on chemotherapy. Status post paracentesis for malignant ascites on 11/15. Oncology considering repeat paracentesis.  Chronic respiratory failure/COPD: On home oxygen at 2 L.  Continue bronchodilators.  Wheezes have  improved.  Anemia: Secondary to chronic disease .Continue to monitor CBC  Coronary Artery disease: Stable  Deconditioning/debility: Secondary to  her multiple comorbidities, progressive cancer.  Will request for PT evaluation.  DVT prophylaxis: SCD Code Status: Partial code Family Communication: Family members present at the bedside Disposition Plan: Still has poor oral intake.  Not ready for discharge.  Might need paracentesis.   Consultants: GI, oncology  Procedures: None  Antimicrobials: None  Subjective: Patient seen and examined the bedside this morning.  She was little sedated during my evaluation today.  Upon reviewing, she got a  dose of Ativan last night.  She still complains of nausea.  Vomiting has improved.  Has been tolerating clear liquid diet.  Objective: Vitals:   05/19/2018 2220 05/30/18 0148 05/30/18 0550 05/30/18 0835  BP: (!) 155/69  (!) 148/76   Pulse: (!) 112  (!) 121   Resp: 20  (!) 32   Temp: 99.7 F (37.6 C)  99.1 F (37.3 C)   TempSrc: Oral  Oral   SpO2: 98% 98% 92% 96%  Weight:      Height:        Intake/Output Summary (Last 24 hours) at 05/30/2018 1150 Last data filed at 05/30/2018 0600 Gross per 24 hour  Intake 3573.7 ml  Output -  Net 3573.7 ml   Filed Weights   05/23/2018 0956  Weight: 76.8 kg    Examination:  General exam: Not in distress,average built, chronically ill elderly female HEENT:PERRL,Oral mucosa moist, Ear/Nose normal on gross exam Respiratory system: Bilateral decreased entry,no wheezes heard today Cardiovascular system: S1 & S2 heard, RRR. No JVD, murmurs, rubs, gallops or clicks. No pedal edema.  Right-sided Port-A-Cath Gastrointestinal system: Abdomen is midly distended, soft and nontender. No organomegaly or masses felt. Normal bowel sounds heard. Central nervous system: Alert and oriented. No focal neurological  deficits. Extremities: No edema, no clubbing ,no cyanosis, distal peripheral pulses palpable. Skin: No rashes, lesions or ulcers,no icterus ,no pallor MSK: Normal muscle bulk,tone ,power Psychiatry: Judgement and insight appear normal. Mood & affect  appropriate.     Data Reviewed: I have personally reviewed following labs and imaging studies  CBC: Recent Labs  Lab 05/13/2018 1821 05/28/18 0500  WBC 4.2 3.3*  NEUTROABS 3.4  --   HGB 8.7* 8.0*  HCT 30.7* 27.7*  MCV 103.7* 103.0*  PLT 209 607   Basic Metabolic Panel: Recent Labs  Lab 06/05/2018 1821 05/28/18 0500  NA 138 141  K 3.4* 3.6  CL 94* 98  CO2 37* 37*  GLUCOSE 93 89  BUN 20 16  CREATININE 0.89 0.75  CALCIUM 8.7* 8.4*   GFR: Estimated Creatinine Clearance: 57 mL/min (by C-G formula based on SCr of 0.75 mg/dL). Liver Function Tests: Recent Labs  Lab 06/03/2018 1821  AST 12*  ALT 9  ALKPHOS 49  BILITOT 0.9  PROT 6.3*  ALBUMIN 2.6*   No results for input(s): LIPASE, AMYLASE in the last 168 hours. No results for input(s): AMMONIA in the last 168 hours. Coagulation Profile: No results for input(s): INR, PROTIME in the last 168 hours. Cardiac Enzymes: No results for input(s): CKTOTAL, CKMB, CKMBINDEX, TROPONINI in the last 168 hours. BNP (last 3 results) Recent Labs    12/17/17 1643  PROBNP 24.0   HbA1C: No results for input(s): HGBA1C in the last 72 hours. CBG: No results for input(s): GLUCAP in the last 168 hours. Lipid Profile: No results for input(s): CHOL, HDL, LDLCALC, TRIG, CHOLHDL, LDLDIRECT in the last 72 hours. Thyroid Function Tests: No results for input(s): TSH, T4TOTAL, FREET4, T3FREE, THYROIDAB in the last 72 hours. Anemia Panel: No results for input(s): VITAMINB12, FOLATE, FERRITIN, TIBC, IRON, RETICCTPCT in the last 72 hours. Sepsis Labs: No results for input(s): PROCALCITON, LATICACIDVEN in the last 168 hours.  No results found for this or any previous visit (from the past 240 hour(s)).       Radiology Studies: No results found.      Scheduled Meds: . feeding supplement  1 Container Oral TID BM  . gabapentin  300 mg Oral QHS  . ipratropium-albuterol  3 mL Nebulization BID  . levothyroxine  50 mcg Oral Q0600  .  magic mouthwash w/lidocaine  5 mL Oral TID AC & HS  . metoCLOPramide (REGLAN) injection  5 mg Intravenous Q6H  . mometasone-formoterol  2 puff Inhalation BID  . pantoprazole (PROTONIX) IV  40 mg Intravenous Q12H  . pyridoxine  100 mg Oral Daily  . sucralfate  1 g Oral TID AC  . vitamin B-12  100 mcg Oral Daily   Continuous Infusions: . sodium chloride 100 mL/hr at 05/30/18 1029  . fluconazole (DIFLUCAN) IV Stopped (05/28/2018 1333)     LOS: 3 days    Time spent: 35 mins.More than 50% of that time was spent in counseling and/or coordination of care.      Shelly Coss, MD [facility] Pager [phone]  If 7PM-7AM, please contact night-coverage [email or web address] Password [email or web address] 05/30/2018, 11:50 AM

## 2018-05-30 NOTE — Progress Notes (Signed)
Once the missing 0800 dose of Magic mouth wash was sent up by pharmacy, it was unclear whether the patient was to swish and spit or swish and swallow due to unspecified administration directions. I attempted to contact the prescribing provider from gastroenterology and I did not get a response. I contacted the hospitalists and was instructed to have the patient rinse and spit. This was explained to patient and family. Nursing will now inform the patient to rinse and swallow the magic mouthwash since we are now aware.  Birdie Hopes, RN

## 2018-05-30 NOTE — Progress Notes (Signed)
IP PROGRESS NOTE  Subjective:   Lynn Ramirez underwent an upper endoscopy yesterday.  She was found to have severe esophagitis.  Her husband is at the bedside.  She is confused this morning.  Objective: Vital signs in last 24 hours: Blood pressure (!) 148/76, pulse (!) 121, temperature 99.1 F (37.3 C), temperature source Oral, resp. rate (!) 32, height 5' 1" (1.549 m), weight 169 lb 5 oz (76.8 kg), SpO2 96 %.  Intake/Output from previous day: 11/20 0701 - 11/21 0700 In: 3973.7 [P.O.:200; I.V.:3723.7; IV Piggyback:50] Out: -   Physical Exam:  HEENT: The mouth is dry, no thrush Cardiovascular: Regular rate and rhythm Lungs: Clear anteriorly Abdomen: Distended and firm Extremities: Trace edema at the left greater than right lower leg Neurologic : Lethargic, arousable, follows commands, confused   Portacath/PICC-without erythema  Lab Results: Recent Labs    05/24/2018 1821 05/28/18 0500  WBC 4.2 3.3*  HGB 8.7* 8.0*  HCT 30.7* 27.7*  PLT 209 190    BMET Recent Labs    05/20/2018 1821 05/28/18 0500  NA 138 141  K 3.4* 3.6  CL 94* 98  CO2 37* 37*  GLUCOSE 93 89  BUN 20 16  CREATININE 0.89 0.75  CALCIUM 8.7* 8.4*     Medications: I have reviewed the patient's current medications.  Assessment/Plan:  1. 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, immunohistochemical profile consistent with a GYN primary, elevated CA 125   Staging CTs of the chest, abdomen, and pelvis on 10/06/2014 revealed a loculated right pleural effusion, ascites, and omental/mesenteric thickening   Cytology from peritoneal fluid 10/13/2014 revealed malignant cells consistent with metastatic adenocarcinoma Biopsy of an omental mass on 11/02/2014 revealed invasive serous carcinoma with psammoma bodies Cycle 1  Taxol/carboplatin 10/28/2014 Cycle 2 Taxol/carboplatin 11/18/2014 Cycle 3 Taxol/carboplatin 12/09/2014  CT scan 12/23/2014 with interval improvement in peritoneal carcinomatosis with near-complete resolution of ascites and decreased omental nodularity. Significant improvement in malignant right pleural effusion.  Status post robotic-assisted laparoscopic hysterectomy with bilateral salpingoophorectomy, omentectomy, radical tumor debulking 12/29/2014. Per Dr. Serita Grit office note 01/14/2015 cytoreduction was optimal with residual disease remaining only in the 1 mm implants on the small intestine. 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  MSI-stable, mutation burden-4, BRAF rearrangement,ERBB4, KRAS G12D Cycle 1 adjuvant Taxol/carboplatin 01/20/2015 Cycle 2 adjuvant Taxol/carboplatin 02/10/2015 Cycle 3 adjuvant Taxol/carboplatin 03/10/2015 CA 125 on 1013 2016-42  CTs of the chest, abdomen, and pelvis 05/31/2015 with no evidence of progressive ovarian cancer  CTs of the chest, abdomen, and pelvis 08/07/2015 and 08/08/2015-no evidence of progressive ovarian cancer  Peritoneal studding noted at the time of the cholecystectomy procedure 08/09/2015 Elevated CA 125 10/07/2015  CT 10/07/2015 with stricturing at the sigmoid colon, constipation, and omental nodularity  Initiation of salvage weekly Taxol chemotherapy 10/13/2015  Taxol changed to every 2 weeks beginning 01/19/2016 due to painful neuropathy.  CT scans 07/19/2016 with no acute findings. No features in the abdomen or pelvis to suggest recurrent disease.Stable mild fullness right intrarenal collecting system.  Elevated CA 125 treatment resumed with  Taxol/Avastin on a 2 week schedule 09/27/2016  CT 02/12/2017-no evidence of carcinomatosis, no evidence of progressive metastatic disease  Taxol/Avastin continued every 2 weeks  CT 09/10/2017-increase in trace ascites, no evidence of progressive carcinomatosis, inflammatory changes  at the lung bases with a new 3 mm right lower lobe nodule  Taxol/Avastin continued every 2 weeks  Progressive rise in the CA 125  Cycle 1 carboplatin 3-week schedule4/17/2019  Cycle 2 carboplatin 11/14/2017  Progressive malignant left pleural effusion June 2019  CT 01/07/2018-resolution of pulmonary nodule seen on prior examination. New small bilateral pleural effusions left greater than right with areas of atelectasis. Stable scattered mediastinal and hilar lymph nodes some partially calcified. Stable emphysematous changes and areas of pulmonary scarring. Small volume abdominal ascites but no omental or peritoneal surface disease. Small scattered mesenteric and retroperitoneal lymph nodes stable. Stable nodularity left adrenal gland.  Cycle 1 gemcitabine 01/09/2018 (day 1/day 8 schedule)  Cycle 2 gemcitabine 01/30/2018  Cycle 3 gemcitabine 02/21/2018  CT 03/03/2018-moderate free fluid in the abdomen and pelvis, thickened walls of small bowel loops in the right lower quadrant  Cycle 4 gemcitabine 03/13/2018  CT 03/03/2018-small bilateral pleural effusions, moderate ascites, small bowel wall thickening in the right lower quadrant  Paracentesis 03/20/2018-negative cytology  Cycle5day 1 gemcitabine 04/03/2018  Cycle 6 gemcitabine 04/24/2018  Peritoneal fluid cytology 04/24/2018-positive for metastatic adenocarcinoma  Clinical disease progression with a risingCA125  Cycle 1 Doxil 05/16/2018 2. COPD-followed by Dr. Lamonte Sakai. 3. Dyspnea secondary to COPD and the large right pleural effusion, status post therapeutic thoracentesis procedures 09/30/2014,10/09/2014, and 10/21/2014. Left thoracentesis  10/30/2014  Progressive left pleural effusion noted on chest x-ray 12/17/2017  Left thoracentesis 12/19/2017, cytology positive for metastatic adenocarcinoma 4. Left nephrectomy as a child 5. Delayed nausea following cycle 1 Taxol/carboplatin, Aloxi/Emend added with cycle 2 with improvement. 6. Right lower extremity edema, right calf pain 12/09/2014. Negative venous Doppler 12/09/2014. 7. Diffuse pruritus following cycle 1 adjuvant Taxol/carboplatin 01/20/2015, no rash, resolved with a steroid dose pack 8. Thrombocytopenia second to chemotherapy, the carboplatin was dose reduced with cycle 2 adjuvant Taxol/carboplatin 02/10/2015 9. Chemotherapy-induced peripheral neuropathy-painful peripheral neuropathy involving the feet 01/19/2016.  10. Admission with acute cholecystitis 08/07/2015, status post a cholecystectomy 08/09/2015 11. Admission 10/08/2015 with abdominal pain/constipation, improved with bowel rest and laxatives 12. Mild right hydronephrosis noted on the CT 10/07/2015. Renal ultrasound 12/16/2015 with no hydronephrosis noted.No hydronephrosis on CT 07/19/2016. 13. Anemia secondary to chronic disease and chemotherapy, status post a red cell transfusion 12/05/2017 14. Admission 02/22/2018 with increased dyspnea 15. Admission 03/23/2018 with a COPD flare 16. Intractable nausea/vomiting November 2019  EGD 05/16/2018- severe esophagitis in the mid and distal esophagus  Lynn Ramirez is admitted with intractable nausea and vomiting.  The etiology of her symptoms remains unclear.  I doubt the esophagitis explains the vomiting.  I discussed the case with Dr. Denman George.  It is possible loculated ascites in the upper abdomen is compressing the stomach and gastric outlet.  I will review the images with interventional radiology to see if the ascites can be drained.  She is confused this morning.  This is likely secondary to Ambien and Ativan she took last night.  Recommendations: 1.  Management of  esophagitis as recommended by Dr. Fuller Plan 2.  Hold Ativan and Ambien 3.  Consider a paracentesis to drain the upper abdominal ascites- I will discuss with interventional radiology 4.   Advance diet when she is more alert, palliative gastrostomy tube placement if she is unable to tolerate a diet   LOS: 3 days   Betsy Coder, MD   05/30/2018, 10:20 AM

## 2018-05-31 DIAGNOSIS — B0089 Other herpesviral infection: Secondary | ICD-10-CM

## 2018-05-31 DIAGNOSIS — K208 Other esophagitis without bleeding: Secondary | ICD-10-CM

## 2018-05-31 DIAGNOSIS — K209 Esophagitis, unspecified: Secondary | ICD-10-CM

## 2018-05-31 LAB — CBC WITH DIFFERENTIAL/PLATELET
Abs Immature Granulocytes: 0.01 10*3/uL (ref 0.00–0.07)
BASOS PCT: 1 %
Basophils Absolute: 0 10*3/uL (ref 0.0–0.1)
EOS PCT: 0 %
Eosinophils Absolute: 0 10*3/uL (ref 0.0–0.5)
HCT: 28.9 % — ABNORMAL LOW (ref 36.0–46.0)
Hemoglobin: 8.2 g/dL — ABNORMAL LOW (ref 12.0–15.0)
Immature Granulocytes: 1 %
Lymphocytes Relative: 17 %
Lymphs Abs: 0.3 10*3/uL — ABNORMAL LOW (ref 0.7–4.0)
MCH: 29.4 pg (ref 26.0–34.0)
MCHC: 28.4 g/dL — AB (ref 30.0–36.0)
MCV: 103.6 fL — AB (ref 80.0–100.0)
MONO ABS: 0.2 10*3/uL (ref 0.1–1.0)
Monocytes Relative: 11 %
Neutro Abs: 1.2 10*3/uL — ABNORMAL LOW (ref 1.7–7.7)
Neutrophils Relative %: 70 %
PLATELETS: 148 10*3/uL — AB (ref 150–400)
RBC: 2.79 MIL/uL — AB (ref 3.87–5.11)
RDW: 18.3 % — ABNORMAL HIGH (ref 11.5–15.5)
WBC: 1.7 10*3/uL — AB (ref 4.0–10.5)
nRBC: 0 % (ref 0.0–0.2)

## 2018-05-31 LAB — BASIC METABOLIC PANEL
Anion gap: 5 (ref 5–15)
BUN: 15 mg/dL (ref 8–23)
CHLORIDE: 104 mmol/L (ref 98–111)
CO2: 32 mmol/L (ref 22–32)
CREATININE: 0.71 mg/dL (ref 0.44–1.00)
Calcium: 8 mg/dL — ABNORMAL LOW (ref 8.9–10.3)
GFR calc Af Amer: >60 mL/min (ref >60)
GLUCOSE: 108 mg/dL — AB (ref 70–99)
Potassium: 3.7 mmol/L (ref 3.5–5.1)
SODIUM: 141 mmol/L (ref 135–145)

## 2018-05-31 LAB — TROPONIN I

## 2018-05-31 MED ORDER — DEXTROSE 5 % IV SOLN
300.0000 mg | Freq: Three times a day (TID) | INTRAVENOUS | Status: DC
  Administered 2018-05-31 – 2018-06-02 (×6): 300 mg via INTRAVENOUS
  Filled 2018-05-31 (×7): qty 6

## 2018-05-31 MED ORDER — LORAZEPAM 0.5 MG PO TABS
0.2500 mg | ORAL_TABLET | Freq: Four times a day (QID) | ORAL | Status: DC | PRN
  Administered 2018-05-31 – 2018-06-01 (×2): 0.25 mg via SUBLINGUAL
  Filled 2018-05-31 (×3): qty 1

## 2018-05-31 MED ORDER — NITROGLYCERIN 0.4 MG SL SUBL: 0.4000 mg | SUBLINGUAL_TABLET | SUBLINGUAL | Status: DC | PRN

## 2018-05-31 MED ORDER — NITROGLYCERIN 0.4 MG SL SUBL: 0.4000 mg | SUBLINGUAL_TABLET | Freq: Once | SUBLINGUAL | Status: DC

## 2018-05-31 NOTE — Progress Notes (Signed)
Paged doctor Adhikari about pt having chest pain. He verbally ordered a 12 lead EKG, troponin, and 0.4mg  nitroglycerin sublingual tab. Verbal order was read back and confirmed. Orders were placed.  Birdie Hopes, RN

## 2018-05-31 NOTE — Progress Notes (Signed)
IP PROGRESS NOTE  Subjective:   Lynn Ramirez is alert this morning.  She is up in a chair.  She continues to have nausea.  She believes the confusion yesterday was related to Ambien.  Zofran and Ativan helped the nausea.  She requests Ativan today. Objective: Vital signs in last 24 hours: Blood pressure (!) 147/71, pulse (!) 110, temperature 99 F (37.2 C), temperature source Oral, resp. rate 16, height _0  (1.549 m), weight 169 lb 5 oz (76.8 kg), SpO2 94 %.  Intake/Output from previous day: 11/21 0701 - 11/22 0700 In: 2194.9 [P.O.:120; I.V.:2024.9; IV Piggyback:50] Out: -   Physical Exam:  HEENT: The mouth is dry, no thrush Cardiovascular: Regular rate and rhythm Lungs: End inspiratory rhonchi at the right lower posterior chest, distant breath sounds, no respiratory distress Abdomen: Distended and firm Extremities: No leg edema Neurologic : Alert and oriented   Portacath/PICC-without erythema  Lab Results: Recent Labs    05/31/18 0600  WBC 1.7*  HGB 8.2*  HCT 28.9*  PLT 148*    BMET Recent Labs    05/31/18 0600  NA 141  K 3.7  CL 104  CO2 32  GLUCOSE 108*  BUN 15  CREATININE 0.71  CALCIUM 8.0*     Medications: I have reviewed the patient's current medications.  Assessment/Plan:  1. 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, immunohistochemical profile consistent with a GYN primary, elevated CA 125   Staging CTs of the chest, abdomen, and pelvis on 10/06/2014 revealed a loculated right pleural effusion, ascites, and omental/mesenteric thickening   Cytology from peritoneal fluid 10/13/2014 revealed malignant cells consistent with metastatic adenocarcinoma Biopsy of an omental mass on 11/02/2014 revealed invasive serous carcinoma with psammoma bodies Cycle 1 Taxol/carboplatin  10/28/2014 Cycle 2 Taxol/carboplatin 11/18/2014 Cycle 3 Taxol/carboplatin 12/09/2014  CT scan 12/23/2014 with interval improvement in peritoneal carcinomatosis with near-complete resolution of ascites and decreased omental nodularity. Significant improvement in malignant right pleural effusion.  Status post robotic-assisted laparoscopic hysterectomy with bilateral salpingoophorectomy, omentectomy, radical tumor debulking 12/29/2014. Per Dr. Serita Grit office note 01/14/2015 cytoreduction was optimal with residual disease remaining only in the 1 mm implants on the small intestine. 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  MSI-stable, mutation burden-4, BRAF rearrangement,ERBB4, KRAS G12D Cycle 1 adjuvant Taxol/carboplatin 01/20/2015 Cycle 2 adjuvant Taxol/carboplatin 02/10/2015 Cycle 3 adjuvant Taxol/carboplatin 03/10/2015 CA 125 on 1013 2016-42  CTs of the chest, abdomen, and pelvis 05/31/2015 with no evidence of progressive ovarian cancer  CTs of the chest, abdomen, and pelvis 08/07/2015 and 08/08/2015-no evidence of progressive ovarian cancer  Peritoneal studding noted at the time of the cholecystectomy procedure 08/09/2015 Elevated CA 125 10/07/2015  CT 10/07/2015 with stricturing at the sigmoid colon, constipation, and omental nodularity  Initiation of salvage weekly Taxol chemotherapy 10/13/2015  Taxol changed to every 2 weeks beginning 01/19/2016 due to painful neuropathy.  CT scans 07/19/2016 with no acute findings. No features in the abdomen or pelvis to suggest recurrent disease.Stable mild fullness right intrarenal collecting system.  Elevated CA 125 treatment resumed with Taxol/Avastin on a 2  week schedule 09/27/2016  CT 02/12/2017-no evidence of carcinomatosis, no evidence of progressive metastatic disease  Taxol/Avastin continued every 2 weeks  CT 09/10/2017-increase in trace ascites, no evidence of progressive carcinomatosis, inflammatory changes at  the lung bases with a new 3 mm right lower lobe nodule  Taxol/Avastin continued every 2 weeks  Progressive rise in the CA 125  Cycle 1 carboplatin 3-week schedule4/17/2019  Cycle 2 carboplatin 11/14/2017  Progressive malignant left pleural effusion June 2019  CT 01/07/2018-resolution of pulmonary nodule seen on prior examination. New small bilateral pleural effusions left greater than right with areas of atelectasis. Stable scattered mediastinal and hilar lymph nodes some partially calcified. Stable emphysematous changes and areas of pulmonary scarring. Small volume abdominal ascites but no omental or peritoneal surface disease. Small scattered mesenteric and retroperitoneal lymph nodes stable. Stable nodularity left adrenal gland.  Cycle 1 gemcitabine 01/09/2018 (day 1/day 8 schedule)  Cycle 2 gemcitabine 01/30/2018  Cycle 3 gemcitabine 02/21/2018  CT 03/03/2018-moderate free fluid in the abdomen and pelvis, thickened walls of small bowel loops in the right lower quadrant  Cycle 4 gemcitabine 03/13/2018  CT 03/03/2018-small bilateral pleural effusions, moderate ascites, small bowel wall thickening in the right lower quadrant  Paracentesis 03/20/2018-negative cytology  Cycle5day 1 gemcitabine 04/03/2018  Cycle 6 gemcitabine 04/24/2018  Peritoneal fluid cytology 04/24/2018-positive for metastatic adenocarcinoma  Clinical disease progression with a risingCA125  Cycle 1 Doxil 05/16/2018 2. COPD-followed by Dr. Lamonte Sakai. 3. Dyspnea secondary to COPD and the large right pleural effusion, status post therapeutic thoracentesis procedures 09/30/2014,10/09/2014, and 10/21/2014. Left thoracentesis 10/30/2014  Progressive left  pleural effusion noted on chest x-ray 12/17/2017  Left thoracentesis 12/19/2017, cytology positive for metastatic adenocarcinoma 4. Left nephrectomy as a child 5. Delayed nausea following cycle 1 Taxol/carboplatin, Aloxi/Emend added with cycle 2 with improvement. 6. Right lower extremity edema, right calf pain 12/09/2014. Negative venous Doppler 12/09/2014. 7. Diffuse pruritus following cycle 1 adjuvant Taxol/carboplatin 01/20/2015, no rash, resolved with a steroid dose pack 8. Thrombocytopenia second to chemotherapy, the carboplatin was dose reduced with cycle 2 adjuvant Taxol/carboplatin 02/10/2015 9. Chemotherapy-induced peripheral neuropathy-painful peripheral neuropathy involving the feet 01/19/2016.  10. Admission with acute cholecystitis 08/07/2015, status post a cholecystectomy 08/09/2015 11. Admission 10/08/2015 with abdominal pain/constipation, improved with bowel rest and laxatives 12. Mild right hydronephrosis noted on the CT 10/07/2015. Renal ultrasound 12/16/2015 with no hydronephrosis noted.No hydronephrosis on CT 07/19/2016. 13. Anemia secondary to chronic disease and chemotherapy, status post a red cell transfusion 12/05/2017 14. Admission 02/22/2018 with increased dyspnea 15. Admission 03/23/2018 with a COPD flare 16. Intractable nausea/vomiting November 2019  EGD 05/21/2018- severe esophagitis in the mid and distal esophagus  Lynn Ramirez is alert and oriented today.  She continues to have nausea.  I reviewed the most recent CT images and radiology this morning.  There is a large loculated fluid collection posterior to the stomach.  I suspect this is causing the nausea with a degree of gastric outlet obstruction.  Dr. Kathlene Cote feels the fluid collection will be difficult to drain percutaneously, though he can consider placing a permanent Pleurx drain into this collection.  An alternate option is to place an endoscopic transgastric drain for palliation of the posterior fluid  collection.  I discussed the case with gastroenterology.  They will consider placement of a gastric drain.  I discussed the poor prognosis with Lynn Ramirez.  The plan is for home hospice care if the upper abdominal fluid collection cannot be drained and she cannot maintain oral hydration/nutrition.  A palliative gastrostomy would be difficult to place secondary to ascites and carcinomatosis.  Recommendations: 1.  Management of esophagitis as recommended by Dr. Fuller Plan 2.  Zofran and low-dose oral Ativan for nausea 3.  Gastroenterology to  consider the indication for placement of a transgastric drain 4.  Consider placement of a Pleurx drain if a transgastric drain cannot be placed 5.  Home Hospice referral if the refractory nausea/vomiting cannot be palliated  Please call Oncology as needed over the weekend.  I will check on her 06/03/2018 if she is not discharged.   LOS: 4 days   Betsy Coder, MD   05/31/2018, 11:18 AM

## 2018-05-31 NOTE — Progress Notes (Signed)
Went to administer 0800 dose of magic mouth was and nausea medicine. From this point forward patient and family would like for the nausea medicine to be given before the pt takes the magic mouth with hopes that she will be able to eat once nausea subsides and there mouth and throat are soothed by the magic mouth wash. I confirmed the patient and family's concerns and will administer nausea medication first and then magic mouthwash for future administrations.  Birdie Hopes, RN

## 2018-05-31 NOTE — Evaluation (Signed)
Physical Therapy Evaluation Patient Details Name: Lynn Ramirez MRN: [id number] DOB: [birth year] Today's Date: 05/31/2018   History of Present Illness  77 yo female admitted with n/v, esophagitis, weakness. Hx of copd-o2 dep, peritoneal ca, malignant ascites, ov ca, ckd, neuropathy  Clinical Impression  On eval, pt required Min assist for mobility. She walked ~15 feet around her room with a RW. She is weak and fatigues easily. She is also currently at risk for falls. O2 sat 92% on 3L at rest, 86% on 3L with activity. Discussed d/c plan with pt/family-pt will return home at discharge. They are agreeable to [place] f/u. Will follow and progress activity as tolerated.     Follow Up Recommendations Home health PT; Home health OT; Supervision/Assistance - 24 hour    Equipment Recommendations  3in1 (PT)    Recommendations for Other Services       Precautions / Restrictions Precautions Precautions: Fall Precaution Comments: O2 dep Restrictions Weight Bearing Restrictions: No      Mobility  Bed Mobility Overal bed mobility: Needs Assistance Bed Mobility: Supine to Sit     Supine to sit: Min assist;HOB elevated     General bed mobility comments: Assist for trunk and to scoot to EOB. Increased time. Pt relied on bedrail.   Transfers Overall transfer level: Needs assistance Equipment used: Rolling walker (2 wheeled) Transfers: Sit to/from [facility] Sit to Stand: Min assist Stand pivot transfers: Min assist       General transfer comment: x3. VCs safety, technique, hand placement. Assist to rise, stabilize, control descent. Stand pivot, bed to bsc, with RW  Ambulation/Gait Ambulation/Gait assistance: Min assist Gait Distance (Feet): 15 Feet Assistive device: Rolling walker (2 wheeled) Gait Pattern/deviations: Step-through pattern;Decreased stride length     General Gait Details: Unsteady. LOB x 1. Cues for safety, posture, distance from RW, proper  RW use. Assist to stabilize pt throughout short distance. O2 sat level dropped to 86% on 3L [place].   Stairs            Wheelchair Mobility    Modified Rankin (Stroke Patients Only)       Balance Overall balance assessment: Needs assistance         Standing balance support: Bilateral upper extremity supported Standing balance-Leahy Scale: Poor                               Pertinent Vitals/Pain Pain Assessment: No/denies pain    Home Living Family/patient expects to be discharged to:: Private residence Living Arrangements: Spouse/significant other Available Help at Discharge: Family;Available 24 hours/day Type of Home: House Home Access: Stairs to enter Entrance Stairs-Rails: None Entrance Stairs-Number of Steps: 2 Home Layout: One level Home Equipment: Walker - 4 wheels;Cane - single point      Prior Function Level of Independence: Independent with assistive device(s)         Comments: uses 3 wheeled walker in home, 4 wheeled walker outside.      Hand Dominance        Extremity/Trunk Assessment   Upper Extremity Assessment Upper Extremity Assessment: Generalized weakness    Lower Extremity Assessment Lower Extremity Assessment: Generalized weakness    Cervical / Trunk Assessment Cervical / Trunk Assessment: Normal  Communication   Communication: No difficulties  Cognition Arousal/Alertness: Awake/alert Behavior During Therapy: WFL for tasks assessed/performed Overall Cognitive Status: Within Functional Limits for tasks assessed  General Comments      Exercises     Assessment/Plan    PT Assessment Patient needs continued PT services  PT Problem List Decreased balance;Decreased mobility;Decreased strength;Decreased activity tolerance;Decreased knowledge of use of DME       PT Treatment Interventions DME instruction;Gait training;Functional mobility training;Therapeutic  activities;Balance training;Patient/family education;Therapeutic exercise    PT Goals (Current goals can be found in the Care Plan section)  Acute Rehab PT Goals Patient Stated Goal: to get stronger PT Goal Formulation: With patient/family Time For Goal Achievement: 06/14/18 Potential to Achieve Goals: Good    Frequency Min 3X/week   Barriers to discharge        Co-evaluation               AM-PAC PT "6 Clicks" Daily Activity  Outcome Measure Difficulty turning over in bed (including adjusting bedclothes, sheets and blankets)?: A Lot Difficulty moving from lying on back to sitting on the side of the bed? : Unable Difficulty sitting down on and standing up from a chair with arms (e.g., wheelchair, bedside commode, etc,.)?: Unable Help needed moving to and from a bed to chair (including a wheelchair)?: A Little Help needed walking in hospital room?: A Little Help needed climbing 3-5 steps with a railing? : A Lot 6 Click Score: 12    End of Session Equipment Utilized During Treatment: Gait belt;Oxygen Activity Tolerance: Patient limited by fatigue Patient left: in bed;with call bell/phone within reach;with bed alarm set;with family/visitor present   PT Visit Diagnosis: Muscle weakness (generalized) (M62.81);Difficulty in walking, not elsewhere classified (R26.2);Unsteadiness on feet (R26.81)    Time: 8677-3736 PT Time Calculation (min) (ACUTE ONLY): 35 min   Charges:   PT Evaluation $PT Eval Moderate Complexity: 1 Mod PT Treatments $Gait Training: 8-22 mins          Weston Anna, PT Acute Rehabilitation Services Pager: [phone] Office: [phone]

## 2018-05-31 NOTE — Progress Notes (Signed)
PROGRESS NOTE    Lynn Ramirez  XTK:[id number] DOB: 1940-12-03 DOA: 05/22/2018 PCP: Midge Minium, MD   Brief Narrative: Patient is a 77 year old female with past medical history of COPD on 2 L oxygen at home, peritoneal carcinomatosis/fallopian tube malignancy with malignant ascites, who presented with complaints of nausea and vomiting from home.  There was concern for gastric outlet obstruction after she presented to her oncologist office.  Admitted for further evaluation.  GI consulted.Underwent EGD with  finding of severe esophagitis and biopsies positive for HSV.Started on acyclovir.   Assessment & Plan:   Principal Problem:   Nausea & vomiting Active Problems:   Metastatic adenocarcinoma (HCC)   Primary peritoneal carcinomatosis (HCC)   Malignant ascites   Malignant neoplasm of fallopian tube (HCC)   CAD (coronary artery disease)   Chronic respiratory failure with hypoxia (HCC)   COPD GOLD III D   Obesity (BMI 30-39.9)   Dysphagia  Nausea/vomiting: There was concern for gastric outlet obstruction due to her metastatic disease.  GI consulted for possible endoscopy/stent for GI outlet obstruction.Underwent EGD on 05/22/2018 which  showed severe esophagitis, normal bulb and second portion of duodenum. Currently on clear liquid diet.  Will advance as tolerated . She feels better today.  Improve nausea, vomiting.   Severe HSV esophagitis: As per the biopsy as per  EGD.  Start on IV acycolvir .    Also started on Protonix.  Anticipate improvement in the nausea and vomiting with this treatment.  Metastatic adenocarcinoma: malignant  neoplasm of the fallopian tube, malignant ascites, peritoneal carcinomatosis.  She follows with Dr. Learta Codding.  Was on chemotherapy. Status post paracentesis for malignant ascites on 11/15. She has malignant right pleural effusion the cytology of which revealed metastatic adenocarcinoma with papillary features consistent with GYN malignancy.  She  had elevated Ca1 25.  Loculated ascites: Patient has large loculated fluid collection along  lesser curvature of the stomach.  Current suspicion is that this fluid collection has contributed to the abdominal pain, nausea and vomiting.  GI was requested for transgastric drainage but as per GI they want to this procedure.  Most likely IR will be consulted for Pleurx drainage. GI recommending to wait until her symptoms improve with viral esophagitis before IR evaluation.  Chronic respiratory failure/COPD: On home oxygen at 2 L.  Continue bronchodilators.  Wheezes have  improved.  Anemia: Secondary to chronic disease .Continue to monitor CBC  Coronary Artery disease: Stable  Deconditioning/debility: Secondary to her multiple comorbidities, progressive cancer.   PT recommended HHPT  DVT prophylaxis: SCD Code Status: Partial code Family Communication: Family members present at the bedside Disposition Plan: Still has poor oral intake.  Not ready for discharge.  Might need IR guided Pleurx catheter placement for drainage of the loculated ascites   Consultants: GI, oncology  Procedures: None  Antimicrobials: None  Subjective: Patient seen and examined the bedside this morning.  More comfortable today.  Nausea is better.  Has been lying on the bed most of the time.  Awaiting physical therapy evaluation  Objective: Vitals:   05/30/18 2040 05/31/18 0426 05/31/18 0609 05/31/18 0803  BP:   (!) 147/71   Pulse:  (!) 108 (!) 110   Resp:   16   Temp: 99.3 F (37.4 C)  99 F (37.2 C)   TempSrc: Oral  Oral   SpO2:  95% 93% 94%  Weight:      Height:        Intake/Output Summary (Last 24 hours) at  05/31/2018 1356 Last data filed at 05/31/2018 1212 Gross per 24 hour  Intake 2691.27 ml  Output -  Net 2691.27 ml   Filed Weights   05/18/2018 0956  Weight: 76.8 kg    Examination:  General exam: Not in distress,average built, chronically ill elderly female HEENT:PERRL,Oral mucosa moist,  Ear/Nose normal on gross exam Respiratory system: Bilateral decreased entry,no wheezes heard today Cardiovascular system: S1 & S2 heard, RRR. No JVD, murmurs, rubs, gallops or clicks. No pedal edema.  Right-sided Port-A-Cath Gastrointestinal system: Abdomen is midly distended, soft and nontender. No organomegaly or masses felt. Normal bowel sounds heard. Central nervous system: Alert and oriented. No focal neurological deficits. Extremities: No edema, no clubbing ,no cyanosis, distal peripheral pulses palpable. Skin: No rashes, lesions or ulcers,no icterus ,no pallor MSK: Normal muscle bulk,tone ,power Psychiatry: Judgement and insight appear normal. Mood & affect appropriate.     Data Reviewed: I have personally reviewed following labs and imaging studies  CBC: Recent Labs  Lab 05/28/2018 1821 05/28/18 0500 05/31/18 0600  WBC 4.2 3.3* 1.7*  NEUTROABS 3.4  --  1.2*  HGB 8.7* 8.0* 8.2*  HCT 30.7* 27.7* 28.9*  MCV 103.7* 103.0* 103.6*  PLT 209 190 170*   Basic Metabolic Panel: Recent Labs  Lab 05/30/2018 1821 05/28/18 0500 05/31/18 0600  NA 138 141 141  K 3.4* 3.6 3.7  CL 94* 98 104  CO2 37* 37* 32  GLUCOSE 93 89 108*  BUN 20 16 15   CREATININE 0.89 0.75 0.71  CALCIUM 8.7* 8.4* 8.0*   GFR: Estimated Creatinine Clearance: 57 mL/min (by C-G formula based on SCr of 0.71 mg/dL). Liver Function Tests: Recent Labs  Lab 05/26/2018 1821  AST 12*  ALT 9  ALKPHOS 49  BILITOT 0.9  PROT 6.3*  ALBUMIN 2.6*   No results for input(s): LIPASE, AMYLASE in the last 168 hours. No results for input(s): AMMONIA in the last 168 hours. Coagulation Profile: No results for input(s): INR, PROTIME in the last 168 hours. Cardiac Enzymes: No results for input(s): CKTOTAL, CKMB, CKMBINDEX, TROPONINI in the last 168 hours. BNP (last 3 results) Recent Labs    12/17/17 1643  PROBNP 24.0   HbA1C: No results for input(s): HGBA1C in the last 72 hours. CBG: No results for input(s): GLUCAP in  the last 168 hours. Lipid Profile: No results for input(s): CHOL, HDL, LDLCALC, TRIG, CHOLHDL, LDLDIRECT in the last 72 hours. Thyroid Function Tests: No results for input(s): TSH, T4TOTAL, FREET4, T3FREE, THYROIDAB in the last 72 hours. Anemia Panel: No results for input(s): VITAMINB12, FOLATE, FERRITIN, TIBC, IRON, RETICCTPCT in the last 72 hours. Sepsis Labs: No results for input(s): PROCALCITON, LATICACIDVEN in the last 168 hours.  No results found for this or any previous visit (from the past 240 hour(s)).       Radiology Studies: No results found.      Scheduled Meds: . feeding supplement  1 Container Oral TID BM  . gabapentin  300 mg Oral QHS  . ipratropium-albuterol  3 mL Nebulization BID  . levothyroxine  50 mcg Oral Q0600  . magic mouthwash w/lidocaine  5 mL Oral TID AC & HS  . metoCLOPramide (REGLAN) injection  5 mg Intravenous Q6H  . mometasone-formoterol  2 puff Inhalation BID  . pantoprazole (PROTONIX) IV  40 mg Intravenous Q12H  . pyridoxine  100 mg Oral Daily  . sucralfate  1 g Oral TID AC  . vitamin B-12  100 mcg Oral Daily   Continuous Infusions: .  sodium chloride 100 mL/hr at 05/31/18 1212  . acyclovir 300 mg (05/31/18 1234)     LOS: 4 days    Time spent: 35 mins.More than 50% of that time was spent in counseling and/or coordination of care.      Shelly Coss, MD [facility] Pager [phone]  If 7PM-7AM, please contact night-coverage [email or web address] Password [email or web address] 05/31/2018, 1:56 PM

## 2018-05-31 NOTE — Progress Notes (Addendum)
Pt. Denies chest pain at this time. States after rec'ing tylenol it improved. Advised ativan placed on hold per dayshift RN due to drowsiness. No signs and symptoms of distress. Will continue to monitor pt. Closely. Neomia Dear RN

## 2018-05-31 NOTE — Progress Notes (Addendum)
[facility] Progress Note   Chief Complaint:   Nausea, vomiting, burning in throat    SUBJECTIVE:    actually swallowing a little easier today and no significant nausea. Familyh in room     ASSESSMENT AND PLAN:   29. 77 yo female with metastatic GYN cancer with malignant pleural effusion and malignant ascites.   2. Nausea / vomiting / burning in throat and chest. No evidence for GOO on imaging. She had severe esophagitis on EGD. Initially thought to have candida but biopsies + for HSV.  -we ordered IV Acyclovir but due to shortage this has to go through ID. I called Dr. Tommy Medal who gives approval for IV Acyclovir given that patient cannot take it PO at this point. Hopefully she will be able to transition to oral Acyclovir soon. Pharmacy notified that I spoke personally with Dr. Tommy Medal -Regarding the N/V, This could be due to the severe esophagitis but also Dr. Learta Codding spoke with Dr. Kathlene Cote (IR) and there is a large loculated fluid collection behind her stomach and there is concern that it is contributing the her nausea. We were asked to consider endoscopic transgastric drainage of the collection and if unable then IR might place Pleurx cath -I spoke with Dr. Fuller Plan who recommended IR drainage of the collection. If her symptoms don't improve with treatment of the severe viral  / reflux esophagitis then IR can evaluate for the Pleurx.  -continue BID PPI, Carafate and Magic Mouthwash (for now)    Attending Physician Note   I have taken an interval history, reviewed the chart and examined the patient. I agree with the Advanced Practitioner's note, impression and recommendations.   Path shows HSV esophagitis. Acyclovir IV was started after obtaining required clearance from ID. Change to PO Acyclovir when she is reliably able to take PO. DC Diflucan. Continue PPI bid, Carafate, Magic mouthwash with lidocaine swish and swallow.   Although the gastric lumen was not  compressed or obstructed at EGD it is possible the loculated upper abdominal fluid collections are contributing to her GI symptoms. IR drainage would be the best approach if her GI symptoms do not respond to treatment of her severe HSV esophagitis.   Lucio Edward, MD FACG [phone]    OBJECTIVE:     Vital signs in last 24 hours: Temp:  [98.7 F (37.1 C)-100.1 F (37.8 C)] 99 F (37.2 C) (11/22 0609) Pulse Rate:  [108-118] 110 (11/22 0609) Resp:  [16-20] 16 (11/22 0609) BP: (121-147)/(70-71) 147/71 (11/22 0609) SpO2:  [91 %-95 %] 94 % (11/22 0803) Last BM Date: 05/28/18 General:   Alert, well-developed female in NAD EENT:  Normal hearing, non icteric sclera, conjunctive pink.  Heart:  Regular rate and rhythm.  No lower extremity edema   Pulm: Normal respiratory effort, lungs CTA bilaterally without wheezes or crackles. Abdomen:  Soft, nondistended, nontender.  Normal bowel sounds, no masses felt.       Neurologic:  Alert and  oriented x4;  grossly normal neurologically. Psych:  Pleasant, cooperative.  Normal mood and affect.   Intake/Output from previous day: 11/21 0701 - 11/22 0700 In: 2194.9 [P.O.:120; I.V.:2024.9; IV Piggyback:50] Out: -  Intake/Output this shift: Total I/O In: 616.4 [I.V.:616.4] Out: -   Lab Results: Recent Labs    05/31/18 0600  WBC 1.7*  HGB 8.2*  HCT 28.9*  PLT 148*   BMET Recent Labs    05/31/18 0600  NA 141  K 3.7  CL  104  CO2 32  GLUCOSE 108*  BUN 15  CREATININE 0.71  CALCIUM 8.0*     Principal Problem:   Nausea & vomiting Active Problems:   Metastatic adenocarcinoma (HCC)   Primary peritoneal carcinomatosis (HCC)   Malignant ascites   Malignant neoplasm of fallopian tube (HCC)   CAD (coronary artery disease)   Chronic respiratory failure with hypoxia (HCC)   COPD GOLD III D   Obesity (BMI 30-39.9)   Dysphagia     LOS: 4 days   Tye Savoy ,NP 05/31/2018, 12:42 PM

## 2018-06-01 ENCOUNTER — Encounter (HOSPITAL_COMMUNITY): Payer: Self-pay

## 2018-06-01 ENCOUNTER — Inpatient Hospital Stay (HOSPITAL_COMMUNITY): Payer: Medicare Other

## 2018-06-01 LAB — MRSA PCR SCREENING: MRSA by PCR: NEGATIVE

## 2018-06-01 MED ORDER — FUROSEMIDE 10 MG/ML IJ SOLN
40.0000 mg | Freq: Once | INTRAMUSCULAR | Status: AC
  Administered 2018-06-01: 40 mg via INTRAVENOUS
  Filled 2018-06-01: qty 4

## 2018-06-01 MED ORDER — METHYLPREDNISOLONE SODIUM SUCC 40 MG IJ SOLR
40.0000 mg | Freq: Two times a day (BID) | INTRAMUSCULAR | Status: DC
  Administered 2018-06-01: 40 mg via INTRAVENOUS
  Filled 2018-06-01: qty 1

## 2018-06-01 MED ORDER — METOCLOPRAMIDE HCL 5 MG/ML IJ SOLN: 10.0000 mg | Freq: Four times a day (QID) | INTRAMUSCULAR | Status: DC

## 2018-06-01 MED ORDER — MAGIC MOUTHWASH W/LIDOCAINE
5.0000 mL | Freq: Four times a day (QID) | ORAL | Status: DC | PRN
  Administered 2018-06-01 (×2): 5 mL via ORAL
  Filled 2018-06-01 (×5): qty 5

## 2018-06-01 MED ORDER — IOHEXOL 300 MG/ML  SOLN
75.0000 mL | Freq: Once | INTRAMUSCULAR | Status: AC | PRN
  Administered 2018-06-01: 75 mL via INTRAVENOUS

## 2018-06-01 MED ORDER — FUROSEMIDE 10 MG/ML IJ SOLN
INTRAMUSCULAR | Status: AC
  Filled 2018-06-01: qty 8

## 2018-06-01 MED ORDER — PROCHLORPERAZINE EDISYLATE 10 MG/2ML IJ SOLN: 10.0000 mg | INTRAMUSCULAR | Status: DC | PRN

## 2018-06-01 MED ORDER — SODIUM CHLORIDE (PF) 0.9 % IJ SOLN
INTRAMUSCULAR | Status: AC
  Filled 2018-06-01: qty 50

## 2018-06-01 MED ORDER — SODIUM CHLORIDE 0.9 % IV SOLN
INTRAVENOUS | Status: DC
  Administered 2018-06-01: 17:00:00 via INTRAVENOUS

## 2018-06-01 MED ORDER — FUROSEMIDE 10 MG/ML IJ SOLN
80.0000 mg | Freq: Once | INTRAMUSCULAR | Status: AC
  Administered 2018-06-02: 80 mg via INTRAVENOUS

## 2018-06-01 MED ORDER — LEVALBUTEROL HCL 0.63 MG/3ML IN NEBU
0.6300 mg | INHALATION_SOLUTION | Freq: Four times a day (QID) | RESPIRATORY_TRACT | Status: DC | PRN
  Administered 2018-06-01: 0.63 mg via RESPIRATORY_TRACT
  Filled 2018-06-01: qty 3

## 2018-06-01 NOTE — Significant Event (Addendum)
I was notified that patient desaturated to 89% needing high flow oxygen.  Chest x-ray was done earlier this morning which did not show any pulmonary edema.  Just showed  loculated pleural effusion on the right side which as per the reviewing of previous records is a malignant effusion. We will give her a dose of lasix.She was also reported to have low urine output so will put a foley. Auscultation revealed bilateral wheezes.Will start on steroids. Cannot rule out aspiration PNA  so will order a CT chest. Will move her to stepdown.

## 2018-06-01 NOTE — Progress Notes (Signed)
Pt. req'ed breathing treatment, RT notified, Janett Billow, RT will be right up. Made pt. Aware. Will continue to monitor pt. Closely. Neomia Dear RN

## 2018-06-01 NOTE — Progress Notes (Signed)
Provider on call notified, req'ed decrease IVF to 50 ml/hr due to increasing crackles throughout. Started strict intake and ouput. Albuterol neb dc'd and xopenex started- rec'ed at 0546. Some improvement to vitals, RR decreasing. BP improved 116/65. Remains tachycardiac at 135. Denies any chest pain or SOB. No distress noted. Will continue to monitor. Neomia Dear RN

## 2018-06-01 NOTE — Progress Notes (Signed)
Pt requesting breathing treatment, advised dose is to soon from last dose. Advised per MD order, Albuterol neb every 6 hours as needed, next dose at 0630. Family and patient stated I'm the first nurse to state that. Read order to pt. And family, advised will notify respiratory. Call made to [facility], RT advised per patient request, stated it's too soon and can not administer until 0630. Advised pt and family per [place], RT. Will continue to monitor.

## 2018-06-01 NOTE — Progress Notes (Signed)
Patient transferred to step down per MD order, report given to receiving RN.

## 2018-06-01 NOTE — Progress Notes (Addendum)
PROGRESS NOTE    Lynn Ramirez  WCB:[id number] DOB: Oct 17, 1940 DOA: 05/30/2018 PCP: Midge Minium, MD   Brief Narrative: Patient is a 77 year old female with past medical history of COPD on 2 L oxygen at home, peritoneal carcinomatosis/fallopian tube malignancy with malignant ascites, who presented with complaints of nausea and vomiting from home.  There was concern for gastric outlet obstruction after she presented to her oncologist office.  Admitted for further evaluation.  GI consulted.Underwent EGD with  finding of severe esophagitis and biopsies positive for HSV.Started on acyclovir.  Patient continues to have nausea, poor oral intake .   Assessment & Plan:   Principal Problem:   Nausea & vomiting Active Problems:   Metastatic adenocarcinoma (HCC)   Primary peritoneal carcinomatosis (HCC)   Malignant ascites   Malignant neoplasm of fallopian tube (HCC)   CAD (coronary artery disease)   Chronic respiratory failure with hypoxia (HCC)   COPD GOLD III D   Obesity (BMI 30-39.9)   Dysphagia   Herpes simplex esophagitis  Nausea/vomiting: There was concern for gastric outlet obstruction due to her metastatic disease.  GI consulted for possible endoscopy/stent for GI outlet obstruction.Underwent EGD on 05/28/2018 which  showed severe esophagitis, normal bulb and second portion of duodenum. Currently on clear liquid diet.  Will advance as tolerated . She feels slightly  better today but continues to be bothered by nausea.  Continue antiemetics.  Severe HSV esophagitis: As per the biopsy as per  EGD.  Started on IV acycolvir .    Also started on Protonix.  Anticipate improvement in the nausea and vomiting with this treatment.If doesnot improve, we may need to start on tube feeding to continue the nutrition and also consider gastric drainage for the loculated effusion could be contributing for nausea and vomiting.  Metastatic adenocarcinoma: malignant  neoplasm of the fallopian tube,  malignant ascites, peritoneal carcinomatosis.  She follows with Dr. Learta Codding.  Was on chemotherapy. Status post paracentesis for malignant ascites on 11/15. She has malignant right pleural effusion the cytology of which revealed metastatic adenocarcinoma with papillary features consistent with GYN malignancy.  She had elevated Ca1 25.  Loculated ascites: Patient has large loculated fluid collection along  lesser curvature of the stomach.  Current suspicion is that this fluid collection has contributed to the abdominal pain, nausea and vomiting.  GI was requested for transgastric drainage but they are  recommending to wait and check if her  symptoms improve with acyclovir for viral esophagitis.If symptoms do not improve then we have to consider consulting IR for transgastric drainage.   Chronic respiratory failure/COPD: On home oxygen at 2 L.  Continue bronchodilators.  Wheezes have  improved.  Anemia: Secondary to chronic disease .Continue to monitor CBC  Coronary Artery disease: Stable  Deconditioning/debility: Secondary to her multiple comorbidities, progressive cancer.   PT recommended HHPT  SOB: Patient has developed mild bilateral lower extremity edema and also has bilateral mild basal crackles.Chest x-ray showed loculated RIGHT pleural effusion which is not a new finding.She has chronic bilateral pleural effusions on reviewing her records. Since she has been nauseous and has poor oral intake ,will continue NS at 50 ml/hr only.  Deconditioning/debility/multiple comorbidities/advanced metastatic cancer: We have been communicating with the family every day.  Due to her advanced cancer, if her nausea and vomiting not improve and she continued to have poor oral intake, home with hospice will be the remaining alternative.  We will conservative care for overall status does not improve and 1 to 2  days.    DVT prophylaxis: SCD Code Status: Partial code Family Communication: Family members present  at the bedside Disposition Plan: Still has poor oral intake.  Not ready for discharge.  Might need IR guided Pleurx catheter placement for drainage of the loculated ascites.   Consultants: GI, oncology  Procedures: None  Antimicrobials: None  Subjective: Patient seen and examined the bedside this morning.  Still bothered by persistent nausea.  She is on clear liquid diet which she has been tolerating so far.  Had an episode of chest pain last night.  Objective: Vitals:   06/01/18 0547 06/01/18 0600 06/01/18 0727 06/01/18 0730  BP:  116/65    Pulse:  (!) 135    Resp:  (!) 27    Temp:      TempSrc:      SpO2: 93% 92% 94% 94%  Weight:      Height:        Intake/Output Summary (Last 24 hours) at 06/01/2018 1343 Last data filed at 06/01/2018 3267 Gross per 24 hour  Intake 1746.19 ml  Output -  Net 1746.19 ml   Filed Weights   05/18/2018 0956  Weight: 76.8 kg    Examination:  General exam: Has weakness, chronically ill elderly female  HEENT:PERRL,Oral mucosa dry, Ear/Nose normal on gross exam Respiratory system: Bilateral mild basilar crackles, bilateral decreased air entry  cardiovascular system: Sinus tachycardia, S1 & S2 heard, RRR. No JVD, murmurs, rubs, gallops or clicks.  Port-A-Cath on the right chest Gastrointestinal system: Abdomen is mildly  distended, soft and mild generalised tenderness. No organomegaly or masses felt. Normal bowel sounds heard. Central nervous system: Alert and oriented. No focal neurological deficits. Extremities: Trace edema on lower extremities, no clubbing ,no cyanosis, distal peripheral pulses palpable. Skin: No rashes, lesions or ulcers,no icterus ,no pallor   Data Reviewed: I have personally reviewed following labs and imaging studies  CBC: Recent Labs  Lab 05/14/2018 1821 05/28/18 0500 05/31/18 0600  WBC 4.2 3.3* 1.7*  NEUTROABS 3.4  --  1.2*  HGB 8.7* 8.0* 8.2*  HCT 30.7* 27.7* 28.9*  MCV 103.7* 103.0* 103.6*  PLT 209 190  124*   Basic Metabolic Panel: Recent Labs  Lab 05/14/2018 1821 05/28/18 0500 05/31/18 0600  NA 138 141 141  K 3.4* 3.6 3.7  CL 94* 98 104  CO2 37* 37* 32  GLUCOSE 93 89 108*  BUN 20 16 15   CREATININE 0.89 0.75 0.71  CALCIUM 8.7* 8.4* 8.0*   GFR: Estimated Creatinine Clearance: 57 mL/min (by C-G formula based on SCr of 0.71 mg/dL). Liver Function Tests: Recent Labs  Lab 05/22/2018 1821  AST 12*  ALT 9  ALKPHOS 49  BILITOT 0.9  PROT 6.3*  ALBUMIN 2.6*   No results for input(s): LIPASE, AMYLASE in the last 168 hours. No results for input(s): AMMONIA in the last 168 hours. Coagulation Profile: No results for input(s): INR, PROTIME in the last 168 hours. Cardiac Enzymes: Recent Labs  Lab 05/31/18 1942  TROPONINI <0.03   BNP (last 3 results) Recent Labs    12/17/17 1643  PROBNP 24.0   HbA1C: No results for input(s): HGBA1C in the last 72 hours. CBG: No results for input(s): GLUCAP in the last 168 hours. Lipid Profile: No results for input(s): CHOL, HDL, LDLCALC, TRIG, CHOLHDL, LDLDIRECT in the last 72 hours. Thyroid Function Tests: No results for input(s): TSH, T4TOTAL, FREET4, T3FREE, THYROIDAB in the last 72 hours. Anemia Panel: No results for input(s): VITAMINB12, FOLATE, FERRITIN, TIBC,  IRON, RETICCTPCT in the last 72 hours. Sepsis Labs: No results for input(s): PROCALCITON, LATICACIDVEN in the last 168 hours.  No results found for this or any previous visit (from the past 240 hour(s)).       Radiology Studies: No results found.      Scheduled Meds: . feeding supplement  1 Container Oral TID BM  . gabapentin  300 mg Oral QHS  . ipratropium-albuterol  3 mL Nebulization BID  . levothyroxine  50 mcg Oral Q0600  . metoCLOPramide (REGLAN) injection  5 mg Intravenous Q6H  . mometasone-formoterol  2 puff Inhalation BID  . nitroGLYCERIN  0.4 mg Sublingual Once  . pantoprazole (PROTONIX) IV  40 mg Intravenous Q12H  . pyridoxine  100 mg Oral Daily  .  sucralfate  1 g Oral TID AC  . vitamin B-12  100 mcg Oral Daily   Continuous Infusions: . acyclovir 300 mg (06/01/18 1131)     LOS: 5 days    Time spent: 35 mins.More than 50% of that time was spent in counseling and/or coordination of care.      Shelly Coss, MD [facility] Pager [phone]  If 7PM-7AM, please contact night-coverage [email or web address] Password [facility] 06/01/2018, 1:43 PM

## 2018-06-01 NOTE — Progress Notes (Signed)
Provider on call notified per protocol BP 165/83, HR 134, RR 32, 93% 3L. Pt denies any pain at this time. Req'ing albuterol breathing treatment, RT aware, can have next neb at 06:30. Will continue to monitor pt closely. Neomia Dear RN

## 2018-06-01 NOTE — Progress Notes (Signed)
RT came to administer breathing tx. Was noted to have increased WOB and low O2 sats of 80% on 3L [place]. Breathing tx started and bedside RN made aware of low O2. BBS at this time coarse. MD paged and made aware. Unable to place patient on BiPAP for WOB due to vomiting and nausea throughout the day. Patient placed on NRB with O2 sats currently 95%. RT will continue to monitor patient.

## 2018-06-03 ENCOUNTER — Inpatient Hospital Stay: Payer: Medicare Other | Admitting: Nurse Practitioner

## 2018-06-03 ENCOUNTER — Inpatient Hospital Stay: Payer: Medicare Other

## 2018-06-09 NOTE — Progress Notes (Signed)
Asystole on the monitor, no respirations, no heart rate. Vista Mink, RN listened for 60 seconds as well as Dondra Spry, Therapist, sports. Time of death 16. MD notified. Family present.

## 2018-06-09 NOTE — Progress Notes (Signed)
11:30 PM- Sister at bedside and called out stating pt took a sip of water, then coughed, and was having difficulty breathing O2 sats went from 97-49% . NRB moved up to 15L from 10L, charge nurse called, and respiratory. Pt continued to decompensate, e-link called. Pt status partial DNR-no chest compressions, no intubation. E-link Dr. ordered 80mg  Lasix IV. As pt decompensated BVM was used to bag with no success. Sister called husband, Dr. discussed situation and the decision was made to make her a full DNR. Pt placed on comfort measures, family at bedside. RN will continue to monitor.

## 2018-06-09 DEATH — deceased

## 2018-06-13 ENCOUNTER — Ambulatory Visit: Payer: Medicare Other | Admitting: Cardiology

## 2018-06-25 ENCOUNTER — Ambulatory Visit: Payer: Medicare Other | Admitting: Emergency Medicine

## 2018-07-10 NOTE — Discharge Summary (Signed)
Death Summary  Lynn Ramirez SWH:[id number] DOB: 1941/01/29 DOA: 06-09-2018  PCP: Midge Minium, MD  Admit date: 2018-06-09 Date of Death: 06/15/18 Time of [id number] Notification: Midge Minium, MD notified of death of 2018/06/25   History of present illness:  Patient is a 78 year old female with past medical history of COPD on 2 L oxygen at home, peritoneal carcinomatosis/fallopian tube malignancy with malignant ascites, who presented with complaints of nausea and vomiting from home.  There was concern for gastric outlet obstruction after she presented to her oncologist office.  Admitted for further evaluation.  GI consulted.Underwent EGD with  finding of severe esophagitis and biopsies positive for HSV.Started on acyclovir.  Patient continued to have nausea, poor oral intake . On 06/01/18 at around 6 pm I was notified that patient desaturated to 89% needing high flow oxygen.  Chest x-ray was done earlier that morning did not show any pulmonary edema.  Just showed  loculated pleural effusion on the right side which as per the reviewing of previous records is a malignant effusion. Given her a dose of lasix.She was also reported to have low urine output so put foley. Auscultation revealed bilateral wheezes.Started on steroids. Moved her to  [place].She expired that night.  Final Diagnoses:  1.  Acute respiratory failure most likely secondary to aspiration pneumonia   The results of significant diagnostics from this hospitalization (including imaging, microbiology, ancillary and laboratory) are listed below for reference.    Significant Diagnostic Studies: Ct Abdomen Pelvis Wo Contrast  Result Date: 05/21/2018 CLINICAL DATA:  Acute onset of nausea, vomiting and generalized weakness. EXAM: CT ABDOMEN AND PELVIS WITHOUT CONTRAST TECHNIQUE: Multidetector CT imaging of the abdomen and pelvis was performed following the standard protocol without IV contrast. COMPARISON:  CT of  the abdomen and pelvis performed 05/05/2018 FINDINGS: Lower chest: A small left pleural effusion is noted, with associated atelectasis. Trace right-sided pleural fluid is seen. Scattered coronary artery calcifications are seen. Hepatobiliary: Mildly complex perihepatic ascites is again noted. The liver is grossly stable in appearance. The patient is status post cholecystectomy, with clips noted at the gallbladder fossa. Pancreas: The pancreas is within normal limits. Spleen: The spleen is unremarkable in appearance. Adrenals/Urinary Tract: The adrenal glands are unremarkable in appearance. The patient is status post left-sided nephrectomy. A tiny hyperdense cyst is noted at the lower pole of the right kidney. Minimal right-sided hydronephrosis may reflect the patient's baseline. No renal or ureteral stones are identified. Nonspecific perinephric stranding is noted about the right kidney. Stomach/Bowel: The stomach is unremarkable in appearance. The small bowel is within normal limits. The appendix is not visualized; there is no evidence for appendicitis. The colon is unremarkable in appearance. Vascular/Lymphatic: Diffuse calcification is seen along the abdominal aorta and its branches. The abdominal aorta is otherwise grossly unremarkable. The inferior vena cava is grossly unremarkable. No retroperitoneal lymphadenopathy is seen. No pelvic sidewall lymphadenopathy is identified. Reproductive: The bladder is mildly distended. Apparent bladder wall thickening may reflect relative decompression. The patient is status post hysterectomy. No suspicious adnexal masses are seen. Other: Prominent somewhat loculated ascites is seen within the abdomen and pelvis, tracking about small bowel loops and along the lesser curvature of the stomach. Musculoskeletal: No acute osseous abnormalities are identified. The visualized musculature is unremarkable in appearance. IMPRESSION: 1. Prominent somewhat loculated ascites within the  abdomen and pelvis, tracking about small bowel loops and along the lesser curvature of the stomach. 2. Mildly complex perihepatic ascites again noted. 3. Small left pleural effusion,  with associated atelectasis. Trace right-sided pleural fluid seen. 4. Scattered coronary artery calcifications. 5. Tiny hyperdense cyst at the lower pole of the right kidney. Minimal chronic right-sided hydronephrosis may reflect the patient's baseline. Aortic Atherosclerosis (ICD10-I70.0). Electronically Signed   By: Garald Balding M.D.   On: 05/21/2018 04:04   Dg Chest 1 View  Result Date: 06/01/2018 CLINICAL DATA:  Shortness of breath, COPD, peritoneal carcinomatosis, ovarian malignancy, hypertension EXAM: CHEST  1 VIEW COMPARISON:  Portable exam 1420 hours compared to 03/24/2018 FINDINGS: RIGHT jugular Port-A-Cath with tip projecting over SVC. Upper normal heart size. Atherosclerotic calcification aorta. Mediastinal contours and pulmonary vascularity normal. Mild LEFT basilar atelectasis versus infiltrate and probable small pleural effusion. Loculated pleural effusion at the inferior RIGHT hemithorax laterally. Associated infiltrate versus atelectasis at RIGHT lung base. Upper lungs clear. Bones demineralized. Old posttraumatic deformity of the proximal RIGHT humerus. IMPRESSION: Loculated RIGHT pleural effusion with infiltrate versus atelectasis at RIGHT base. Additional atelectasis versus infiltrate and probable pleural effusion at LEFT base. Electronically Signed   By: Lavonia Dana M.D.   On: 06/01/2018 16:39   US Paracentesis  Result Date: 05/24/2018 INDICATION: Patient with history of fallopian tube carcinoma with recurrent malignant ascites. Request made for therapeutic paracentesis. EXAM: ULTRASOUND GUIDED THERAPEUTIC PARACENTESIS MEDICATIONS: None COMPLICATIONS: None immediate. PROCEDURE: Informed written consent was obtained from the patient after a discussion of the risks, benefits and alternatives to treatment. A  timeout was performed prior to the initiation of the procedure. Initial ultrasound scanning demonstrates a small to moderate amount of ascites within the left lower abdominal quadrant. The left lower abdomen was prepped and draped in the usual sterile fashion. 1% lidocaine was used for local anesthesia. Following this, a 19 gauge, 10-cm, Yueh catheter was introduced. An ultrasound image was saved for documentation purposes. The paracentesis was performed. The catheter was removed and a dressing was applied. The patient tolerated the procedure well without immediate post procedural complication. FINDINGS: A total of approximately 1.3 liters of yellow fluid was removed. IMPRESSION: Successful ultrasound-guided therapeutic paracentesis yielding 1.3 liters of peritoneal fluid. Read by: Rowe Robert, PA-C Electronically Signed   By: Jacqulynn Cadet M.D.   On: 05/24/2018 15:48   Ct Chest Limited W Contrast  Result Date: 06/01/2018 CLINICAL DATA:  COPD exacerbation, pleural effusion, wheezing. History of metastatic adenocarcinoma. EXAM: CT CHEST WITHOUT CONTRAST TECHNIQUE: Multidetector CT imaging of the chest was performed following the standard protocol without IV contrast. COMPARISON:  Chest CT dated 05/06/2018. FINDINGS: Cardiovascular: Heart size is normal. No significant pericardial effusion. No thoracic aortic aneurysm. Scattered atherosclerosis of the thoracic aorta. No central obstructing pulmonary embolism seen. Mediastinum/Nodes: Esophagus is distended with fluid to the level of the upper esophagus which is new compared to the previous chest CT. Trachea is unremarkable. Lungs/Pleura: Increased RIGHT pleural effusion, at least partially loculated, now at least moderate in size (previously small). Additional ill-defined consolidation at the RIGHT lung base, atelectasis versus pneumonia. Additional moderate LEFT-sided pleural effusion is slightly increased compared to the previous exam, not obviously  loculated. Upper Abdomen: Loculated appearing ascites within the upper abdomen, incompletely imaged, likely malignant ascites as previously suggested, likely with associated omental caking. Musculoskeletal: No acute or suspicious osseous finding. IMPRESSION: 1. Increased RIGHT pleural effusion, at least partially loculated, now at least moderate in size (previously small). 2. Additional moderate-sized LEFT pleural effusion, slightly increased compared to the previous exam, not obviously loculated. 3. Additional ill-defined consolidation at the RIGHT lung base, with differential considerations of atelectasis, aspiration and  pneumonia. 4. Esophagus is distended with fluid to the level of the upper esophagus which is new compared to the previous chest CT, of uncertain etiology but concerning for a developing gastric obstruction or small bowel obstruction. Consider plain film correlation. The fluid distending the esophagus to the level of the cervical esophagus makes patient a significant risk for aspiration. 5. Loculated appearing ascites within the upper abdomen, incompletely imaged, likely malignant ascites as previously suggested, likely with associated omental caking. Aortic Atherosclerosis (ICD10-I70.0). These results will be called to the ordering clinician or representative by the Radiologist Assistant, and communication documented in the PACS or zVision Dashboard. Electronically Signed   By: Franki Cabot M.D.   On: 06/01/2018 23:58   Dg Abd 2 Views  Result Date: 05/24/2018 CLINICAL DATA:  Upper abdominal pain, nausea and vomiting. Ovarian cancer. EXAM: ABDOMEN - 2 VIEW COMPARISON:  05/21/2018 FINDINGS: Pleural effusions present bilaterally, larger on the left with basilar atelectasis. No free air. Nonobstructive bowel gas pattern. Degenerative changes of the spine. Bones are osteopenic. IMPRESSION: Negative for obstruction or free air. Pleural effusions and basilar atelectasis, worse on the left  Electronically Signed   By: Jerilynn Mages.  Shick M.D.   On: 05/24/2018 15:37   Dg Abd Portable 1v  Result Date: 05/26/2018 CLINICAL DATA:  Follow-up small bowel obstruction EXAM: PORTABLE ABDOMEN - 1 VIEW COMPARISON:  May 24, 2018 FINDINGS: There is a small left pleural effusion with underlying opacity. No evidence of bowel obstruction identified. Previous cholecystectomy. No free air, portal venous gas, or pneumatosis seen on the supine view. IMPRESSION: Small effusion and underlying opacity in the left lower chest. No acute abnormalities seen within the abdomen. No obstruction noted. Electronically Signed   By: Dorise Bullion III M.D   On: 05/12/2018 21:11    Microbiology: No results found for this or any previous visit (from the past 240 hour(s)).   Labs: Basic Metabolic Panel: No results for input(s): NA, K, CL, CO2, GLUCOSE, BUN, CREATININE, CALCIUM, MG, PHOS in the last 168 hours. Liver Function Tests: No results for input(s): AST, ALT, ALKPHOS, BILITOT, PROT, ALBUMIN in the last 168 hours. No results for input(s): LIPASE, AMYLASE in the last 168 hours. No results for input(s): AMMONIA in the last 168 hours. CBC: No results for input(s): WBC, NEUTROABS, HGB, HCT, MCV, PLT in the last 168 hours. Cardiac Enzymes: No results for input(s): CKTOTAL, CKMB, CKMBINDEX, TROPONINI in the last 168 hours. D-Dimer No results for input(s): DDIMER in the last 72 hours. BNP: Invalid input(s): POCBNP CBG: No results for input(s): GLUCAP in the last 168 hours. Anemia work up No results for input(s): VITAMINB12, FOLATE, FERRITIN, TIBC, IRON, RETICCTPCT in the last 72 hours. Urinalysis    Component Value Date/Time   COLORURINE YELLOW 05/21/2018 0108   APPEARANCEUR CLEAR 05/21/2018 0108   LABSPEC 1.018 05/21/2018 0108   LABSPEC 1.015 01/03/2017 1110   PHURINE 5.0 05/21/2018 0108   GLUCOSEU NEGATIVE 05/21/2018 0108   GLUCOSEU Negative 01/03/2017 1110   HGBUR NEGATIVE 05/21/2018 0108   BILIRUBINUR  NEGATIVE 05/21/2018 0108   BILIRUBINUR Negative 01/03/2017 1110   KETONESUR NEGATIVE 05/21/2018 0108   PROTEINUR NEGATIVE 05/21/2018 0108   UROBILINOGEN 0.2 01/03/2017 1110   NITRITE NEGATIVE 05/21/2018 0108   LEUKOCYTESUR NEGATIVE 05/21/2018 0108   LEUKOCYTESUR Negative 01/03/2017 1110   Sepsis Labs Invalid input(s): PROCALCITONIN,  WBC,  LACTICIDVEN     SIGNED:  Shelly Coss, MD  [facility] 06/12/2018, 2:23 PM Pager [phone]  If 7PM-7AM, please contact night-coverage  [email or web address] Password [email or web address]

## 2018-11-05 ENCOUNTER — Ambulatory Visit: Payer: Medicare Other | Admitting: Family Medicine

## 2018-11-17 ENCOUNTER — Encounter

## 2019-09-20 IMAGING — CT CT CHEST LIMITED W/ CM
2 of 5 series · 15 of 36 positions shown, 18 images · non-contrast
Comparison: Chest CT dated 05/06/2018.

CLINICAL DATA: COPD exacerbation, pleural effusion, wheezing.
History of metastatic adenocarcinoma.

EXAM:
CT CHEST WITHOUT CONTRAST
TECHNIQUE: Multidetector CT imaging of the chest was performed following the
standard protocol without IV contrast.

[Series 9: super d · axial · 0.77mm/px · z∈[-287,+10]mm · 12 of 347 slices shown, 15 images]
[im 25/347  mediastinal]
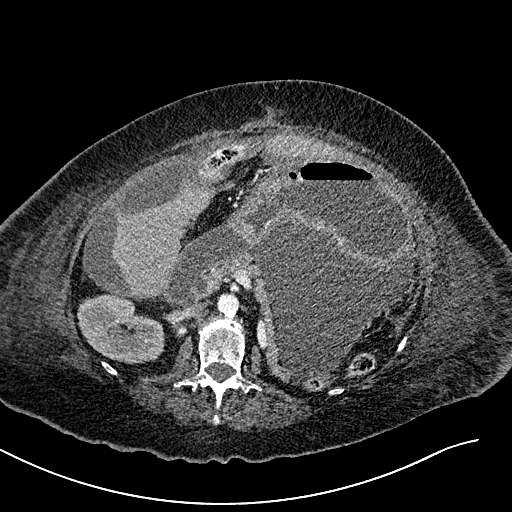
[im 25/347  lung]
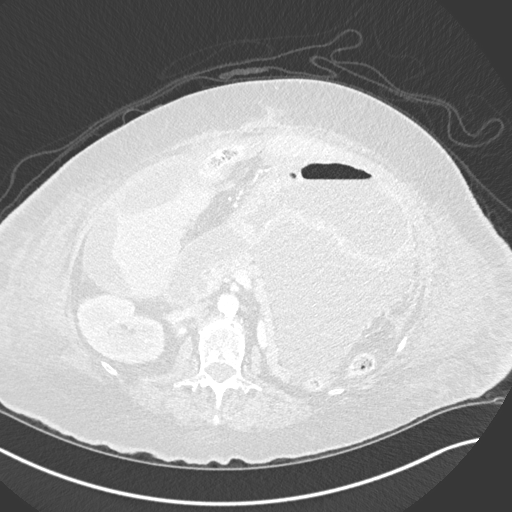
[im 50/347  lung]
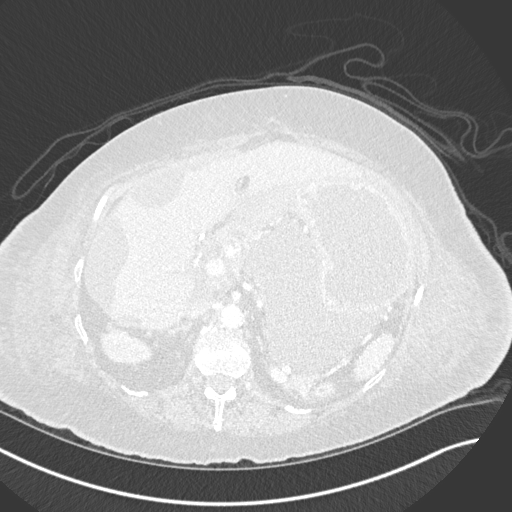
[im 75/347  lung]
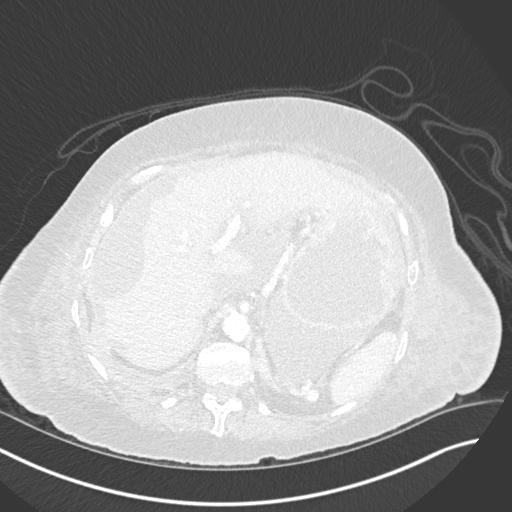
[im 99/347  lung]
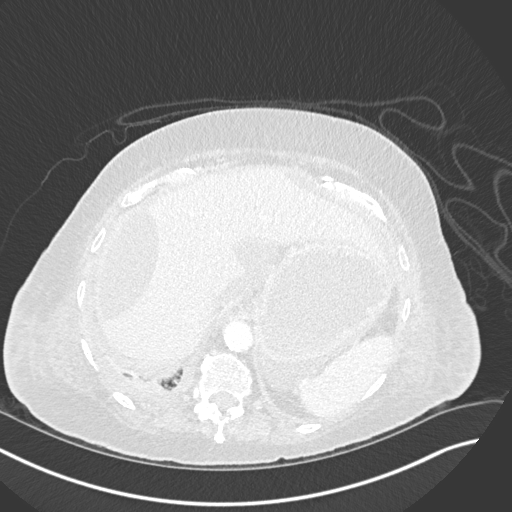
[im 124/347  mediastinal]
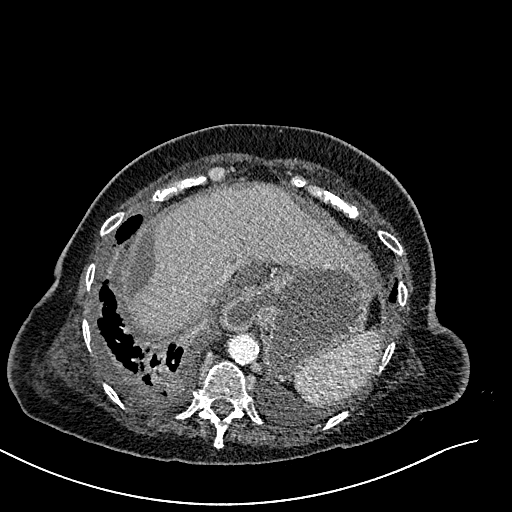
[im 124/347  lung]
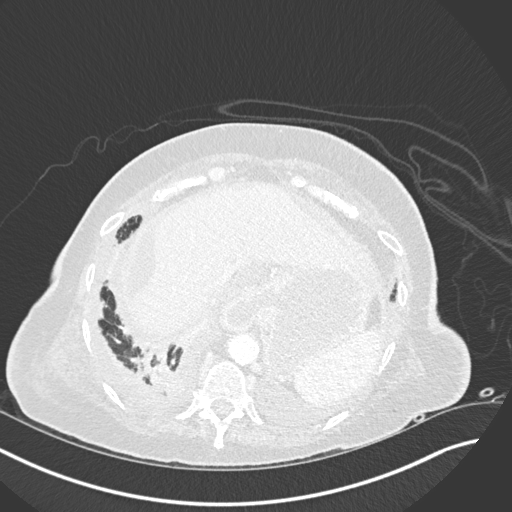
[im 149/347  lung]
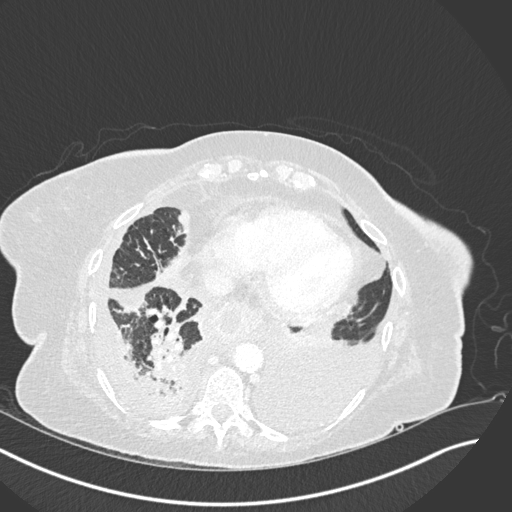
[im 198/347  lung]
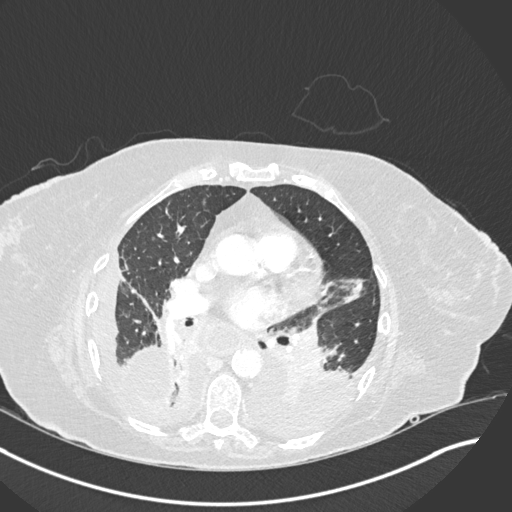
[im 223/347  lung]
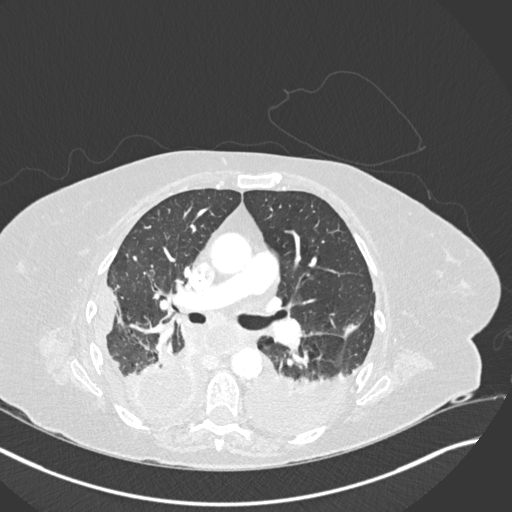
[im 248/347  mediastinal]
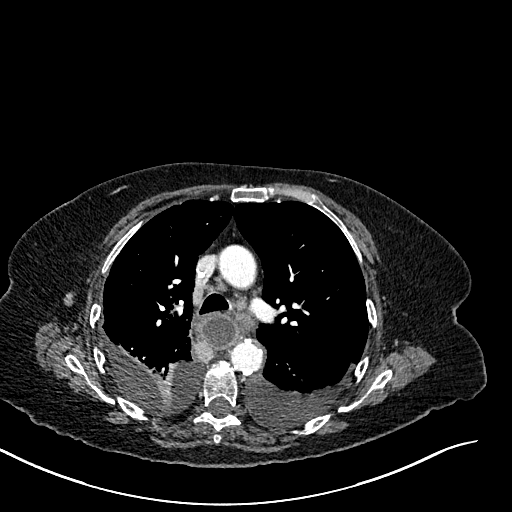
[im 248/347  lung]
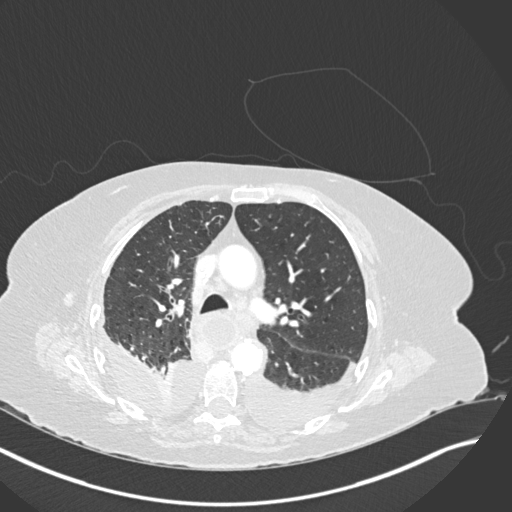
[im 272/347  lung]
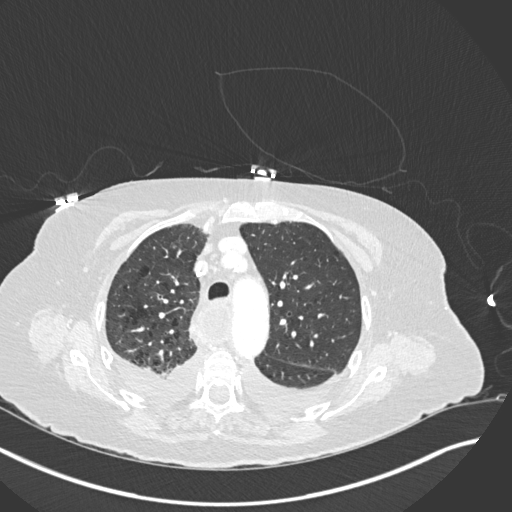
[im 297/347  lung]
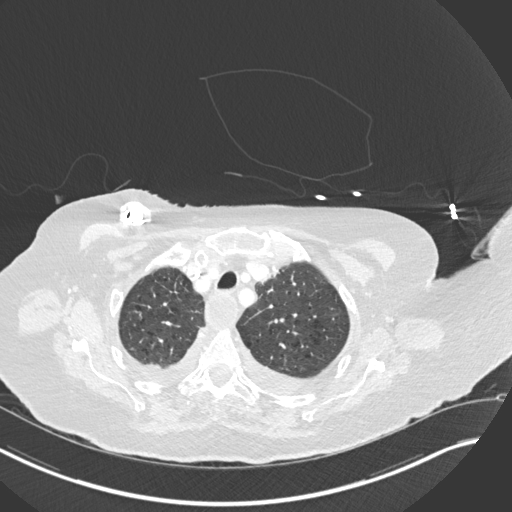
[im 322/347  lung]
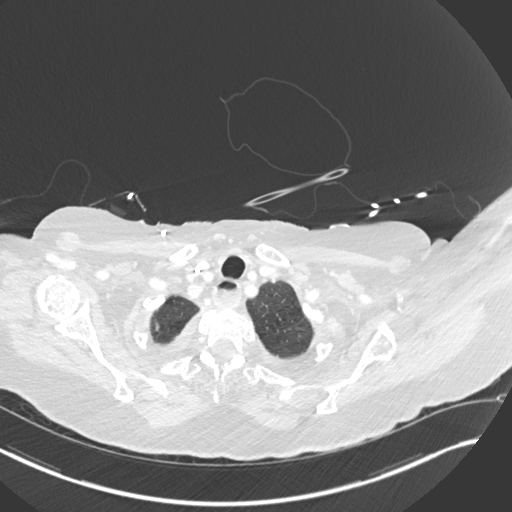

[Series 11: coronal · coronal · 0.68mm/px · 3 of 146 slices shown]
[im 30/146  lung]
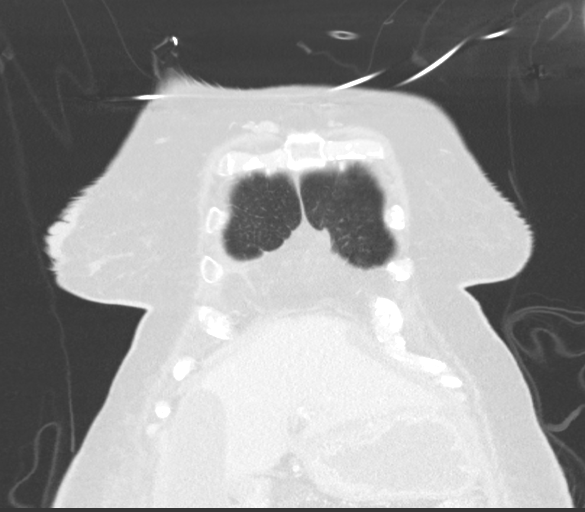
[im 59/146  lung]
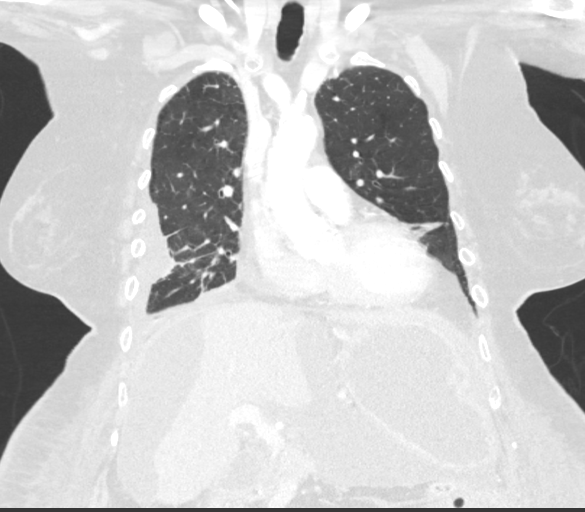
[im 88/146  lung]
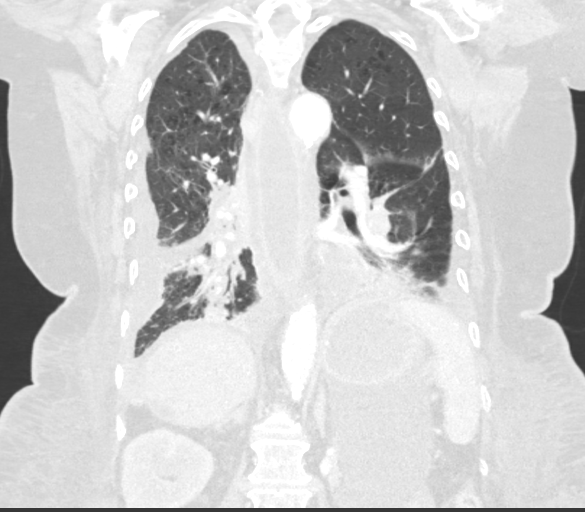

[15 of 36 positions shown; findings below may reference images not displayed]

FINDINGS: Cardiovascular: Heart size is normal. No significant pericardial
effusion. No thoracic aortic aneurysm. Scattered atherosclerosis of
the thoracic aorta. No central obstructing pulmonary embolism seen.

Mediastinum/Nodes: Esophagus is distended with fluid to the level of
the upper esophagus which is new compared to the previous chest CT.
Trachea is unremarkable..

Lungs/Pleura: Increased RIGHT pleural effusion, at least partially
loculated, now at least moderate in size (previously small).
Additional ill-defined consolidation at the RIGHT lung base,
atelectasis versus pneumonia.

Additional moderate LEFT-sided pleural effusion is slightly
increased compared to the previous exam, not obviously loculated..

Upper Abdomen: Loculated appearing ascites within the upper abdomen,
incompletely imaged, likely malignant ascites as previously
suggested, likely with associated omental caking.

Musculoskeletal: No acute or suspicious osseous finding.
IMPRESSION: 1. Increased RIGHT pleural effusion, at least partially loculated,
now at least moderate in size (previously small).
2. Additional moderate-sized LEFT pleural effusion, slightly
increased compared to the previous exam, not obviously loculated.
3. Additional ill-defined consolidation at the RIGHT lung base, with
differential considerations of atelectasis, aspiration and
pneumonia.
4. Esophagus is distended with fluid to the level of the upper
esophagus which is new compared to the previous chest CT, of
uncertain etiology but concerning for a developing gastric
obstruction or small bowel obstruction. Consider plain film
correlation. The fluid distending the esophagus to the level of the
cervical esophagus makes patient a significant risk for aspiration.
5. Loculated appearing ascites within the upper abdomen,
incompletely imaged, likely malignant ascites as previously
suggested, likely with associated omental caking.

Aortic Atherosclerosis ([id number]).

These results will be called to the ordering clinician or
representative by the Radiologist Assistant, and communication
documented in the PACS or zVision Dashboard.
